# Patient Record
Sex: Male | Born: 1945
Health system: Southern US, Community
[De-identification: ages and names within clinical notes are randomized; demographics above are authoritative.]

## PROBLEM LIST (undated history)

## (undated) DIAGNOSIS — R579 Shock, unspecified: Secondary | ICD-10-CM

## (undated) DIAGNOSIS — K219 Gastro-esophageal reflux disease without esophagitis: Secondary | ICD-10-CM

## (undated) DIAGNOSIS — K922 Gastrointestinal hemorrhage, unspecified: Secondary | ICD-10-CM

## (undated) DIAGNOSIS — F32A Depression, unspecified: Secondary | ICD-10-CM

## (undated) DIAGNOSIS — N390 Urinary tract infection, site not specified: Secondary | ICD-10-CM

## (undated) DIAGNOSIS — E871 Hypo-osmolality and hyponatremia: Secondary | ICD-10-CM

## (undated) DIAGNOSIS — F329 Major depressive disorder, single episode, unspecified: Secondary | ICD-10-CM

## (undated) DIAGNOSIS — I10 Essential (primary) hypertension: Secondary | ICD-10-CM

## (undated) DIAGNOSIS — N400 Enlarged prostate without lower urinary tract symptoms: Secondary | ICD-10-CM

## (undated) DIAGNOSIS — R1310 Dysphagia, unspecified: Secondary | ICD-10-CM

## (undated) DIAGNOSIS — M1990 Unspecified osteoarthritis, unspecified site: Secondary | ICD-10-CM

## (undated) DIAGNOSIS — E119 Type 2 diabetes mellitus without complications: Secondary | ICD-10-CM

## (undated) DIAGNOSIS — N182 Chronic kidney disease, stage 2 (mild): Secondary | ICD-10-CM

## (undated) DIAGNOSIS — R011 Cardiac murmur, unspecified: Secondary | ICD-10-CM

## (undated) DIAGNOSIS — I4729 Other ventricular tachycardia: Secondary | ICD-10-CM

## (undated) DIAGNOSIS — Z8701 Personal history of pneumonia (recurrent): Secondary | ICD-10-CM

## (undated) DIAGNOSIS — I469 Cardiac arrest, cause unspecified: Secondary | ICD-10-CM

## (undated) DIAGNOSIS — I00 Rheumatic fever without heart involvement: Secondary | ICD-10-CM

## (undated) DIAGNOSIS — Z960 Presence of urogenital implants: Secondary | ICD-10-CM

## (undated) DIAGNOSIS — G4730 Sleep apnea, unspecified: Secondary | ICD-10-CM

## (undated) DIAGNOSIS — H9190 Unspecified hearing loss, unspecified ear: Secondary | ICD-10-CM

## (undated) DIAGNOSIS — I472 Ventricular tachycardia: Secondary | ICD-10-CM

## (undated) DIAGNOSIS — J189 Pneumonia, unspecified organism: Secondary | ICD-10-CM

## (undated) DIAGNOSIS — Z8673 Personal history of transient ischemic attack (TIA), and cerebral infarction without residual deficits: Secondary | ICD-10-CM

## (undated) HISTORY — PX: OTHER SURGICAL HISTORY: SHX169

---

## 1984-04-04 HISTORY — PX: LUMBAR LAMINECTOMY: SHX95

## 1987-04-05 HISTORY — PX: KNEE ARTHROSCOPY: SHX127

## 2002-05-20 ENCOUNTER — Encounter: Admission: RE | Admit: 2002-05-20 | Discharge: 2002-05-20 | Payer: Self-pay | Admitting: *Deleted

## 2004-02-10 ENCOUNTER — Ambulatory Visit: Payer: Self-pay | Admitting: Cardiology

## 2004-02-17 ENCOUNTER — Inpatient Hospital Stay (HOSPITAL_BASED_OUTPATIENT_CLINIC_OR_DEPARTMENT_OTHER): Admission: RE | Admit: 2004-02-17 | Discharge: 2004-02-17 | Payer: Self-pay | Admitting: Cardiology

## 2004-02-17 ENCOUNTER — Ambulatory Visit: Payer: Self-pay | Admitting: Cardiology

## 2004-02-18 ENCOUNTER — Ambulatory Visit: Payer: Self-pay | Admitting: Cardiology

## 2005-01-23 ENCOUNTER — Emergency Department (HOSPITAL_COMMUNITY): Admission: EM | Admit: 2005-01-23 | Discharge: 2005-01-23 | Payer: Self-pay | Admitting: Emergency Medicine

## 2007-12-27 ENCOUNTER — Ambulatory Visit: Payer: Self-pay | Admitting: Cardiology

## 2008-01-08 ENCOUNTER — Ambulatory Visit: Payer: Self-pay | Admitting: Cardiology

## 2008-01-11 ENCOUNTER — Ambulatory Visit: Payer: Self-pay | Admitting: Cardiology

## 2008-01-15 ENCOUNTER — Inpatient Hospital Stay (HOSPITAL_BASED_OUTPATIENT_CLINIC_OR_DEPARTMENT_OTHER): Admission: RE | Admit: 2008-01-15 | Discharge: 2008-01-15 | Payer: Self-pay | Admitting: Cardiovascular Disease

## 2008-01-15 ENCOUNTER — Ambulatory Visit: Payer: Self-pay | Admitting: Cardiovascular Disease

## 2008-04-04 HISTORY — PX: CARPAL TUNNEL RELEASE: SHX101

## 2008-04-04 HISTORY — PX: CERVICAL SPINE SURGERY: SHX589

## 2009-01-08 ENCOUNTER — Emergency Department (HOSPITAL_COMMUNITY): Admission: EM | Admit: 2009-01-08 | Discharge: 2009-01-09 | Payer: Self-pay | Admitting: Emergency Medicine

## 2010-08-17 NOTE — Assessment & Plan Note (Signed)
[facility]                          [facility]   Albert Garrison, Albert Garrison                      MRN:          [id number]  DATE:01/11/2008                            DOB:          27-Sep-1945    PRIMARY CARDIOLOGIST:  Learta Codding, MD, [facility]   REASON FOR VISIT:  Scheduled followup.  Please refer to my initial  consultation note of December 27, 2007, for full details.   Albert Garrison returns to our clinic for review of recent noninvasive  studies which were ordered for further evaluation of progressive  exertional dyspnea.  A baseline 2-D echo showed normal left ventricular  function with evidence of diastolic dysfunction, and no significant  valvular abnormalities.   I also ordered a stress echocardiogram, given that he had had a previous  false positive adenosine Cardiolite in 2005.  This preceded his only  cardiac catheterization, which yielded nonobstructive CAD with only  minimal stenosis (20-25%) in the LAD and RCA.   The current stress echocardiogram, however, was interpreted as an  equivocal study, by Dr. Simona Huh.  He stated that at peak stress  there was possible anteroseptal hypokinesis/paradoxical motion.  There  are no definite EKG changes, however, and no frank complaint of chest  pain.  During our visit today, however, there is suggestion by the  patient's wife that he was feeling some discomfort in the chest, holding  his fist up just below his throat.  He also clearly was experiencing  significant shortness of breath, as had been noted by his wife in the  recent past.   I also ordered a baseline chest x-ray which was negative.   A fasting lipid profile revealed total cholesterol of 204, triglycerides  of 258, HDL 24, and LDL 128.   CURRENT MEDICATIONS:  1. Sinemet CR 50/200 mg twice daily.  2. Amantadine 100 mg b.i.d.  3. Indomethacin 25 mg t.i.d.  4. Bupropion 100 mg b.i.d.  5. Namenda 10 mg b.i.d.  6.  Aspirin 81 mg daily.   PHYSICAL EXAMINATION:  VITAL SIGNS:  Blood pressure 157/93, pulse 56,  and regular weight 216.  GENERAL:  A 65 year old male, sitting upright, in no distress.  HEENT:  Normocephalic, atraumatic.  PERRLA.  EOMI.  NECK:  Palpable carotid pulse without bruits; no JVD.  LUNGS:  Clear to auscultation bilaterally.  HEART:  Regular rate and rhythm.  No significant murmurs.  No rubs.  ABDOMEN:  Soft, nontender with intact bowel sounds.  EXTREMITIES:  Palpable dorsalis pedis pulses with no edema.  SKIN:  No rashes or lesions.  MUSCULOSKELETAL:  No gross joint deformity.  NEUROLOGIC:  No focal deficit.   IMPRESSION:  1. Exertional dyspnea.      a.     Worrisome for anginal equivalent.      b.     Recent, equivocal dobutamine stress echocardiogram with       possible anteroseptal hypokinesis.      c.     History of nonobstructive coronary artery disease by cardiac       catheterization, November 2005.  d.     False positive adenosine stress Cardiolite (pre cath),       suggestive of inferobasal ischemia.  2. Preserved left ventricular function.      a.     Mild aortic regurgitation.  3. Mixed dyslipidemia.      a.     Hypertriglyceridemia/low HDL.  4. Type 2 diabetes mellitus, diet controlled.  5. Remote tobacco.  6. Borderline hypertension.  7. Parkinson disease.      a.     Associated dementia.  8. Bipolar disorder.  9. Obstructive sleep apnea, on continuous positive airway pressure.   PLAN:  Following review of recent stress echocardiogram with Dr. Andee Lineman,  plan is to proceed with a relook cardiac catheterization.  I am  concerned that Albert Garrison recent development of significant exertional  dyspnea is his anginal equivalent.  The patient is agreeable with this  recommendation and the risks/benefits were discussed.  We will arrange  to have this done in our JV catheterization lab early next week.  Of  note, I have also instructed that he start on Crestor  10 mg daily, for  aggressive treatment of mixed dyslipidemia.      Gene Serpe, PA-C  Electronically Signed      Learta Codding, MD,FACC  Electronically Signed   GS/MedQ  DD: 01/11/2008  DT: 01/12/2008  Job #: [id number]

## 2010-08-17 NOTE — Assessment & Plan Note (Signed)
[facility]                          [facility] NOTE   Albert Garrison, Albert Garrison                      MRN:          [id number]  DATE:12/27/2007                            DOB:          [birth year]    PRIMARY CARDIOLOGIST:  Learta Codding, MD,FACC (new)   PRIMARY CARE PHYSICIAN:  Dr. Selinda Flavin.   REASON FOR CONSULTATION:  Albert Garrison is a very pleasant 65 year old  male, with history of nonobstructive coronary artery disease by prior  catheterization in 2005, who is now referred for further evaluation of  exertional dyspnea.   Albert Garrison has not returned to our clinic since he underwent coronary  angiography.  This was following an abnormal stress test which was  performed for evaluation of chest pain.  The stress test suggested  inferobasal wall ischemia; EF 50%.   Cardiac catheterization, however, suggested nonobstructive CAD with  normal left ventricular function and no significant valvular  abnormalities.  Aggressive risk factor modification was recommended.   Albert Garrison has cardiac risk factors notable for diabetes mellitus (diet  controlled), history of dyslipidemia, and age.  He has no known history  of hypertension or family history of premature CAD.   According to the patient's wife, Albert Garrison has been complaining of  exertional dyspnea in the recent past.  This appears to be worse than it  had been in years past.  Of note, the patient denies any associated  chest discomfort.  He also denies any symptoms of PND, orthopnea, or  lower extremity edema.  He does have obstructive sleep apnea and is  compliant with CPAP.   EKG in our office today reveals NSR at 69 bpm with borderline LAD; no  ischemic changes.   ALLERGIES:  No known drug allergies.   CURRENT MEDICATIONS:  Namenda, Sinemet, amantadine 100 b.i.d.,  indomethacin t.i.d., and bupropion b.i.d.   PAST MEDICAL HISTORY:  1. Nonobstructive CAD.      a.     By cardiac  catheterization, November 2005.      b.     False positive adenosine stress Cardiolite.      c.     Normal left ventricular function.  2. Parkinson disease.      a.     Associated dementia.  3. Bipolar disorder.  4. Obstructive sleep apnea, on CPAP.  5. Type 2 diabetes mellitus, diet controlled.   FAMILY HISTORY:  Father deceased at age 74, secondary to myocardial  function.  Mother deceased age at 16.   SOCIAL HISTORY:  The patient is married.  They have 4 grown children.  He has not smoked tobacco since 1977.  He is a retired Games developer,  but still remains as active as he can.  Denies alcohol use.   REVIEW OF SYSTEMS:  As noted per HPI, remaining systems negative.   PHYSICAL EXAMINATION:  VITAL SIGNS:  Blood pressure 142/98, pulse 72,  regular, weight 213.  GENERAL:  A 65 year old male, sitting upright, in no distress.  HEENT:  Normocephalic, atraumatic.  PERRLA.  EOMI.  NECK:  Palpable carotid pulse without bruits; no  JVD at 90 degrees.  LUNGS:  Clear to auscultation bilaterally.  HEART:  Regular rate and rhythm (S1 and S2).  No significant murmurs.  No rubs.  ABDOMEN:  Soft, nontender, intact bowel sounds.  EXTREMITIES:  Palpable dorsalis pedis pulses with no pedal edema.  SKIN:  Mildly diaphoretic.  MUSCULOSKELETAL:  No joint deformity.  NEURO:  Alert and oriented.   IMPRESSION:  1. Exertional dyspnea.      a.     History of nonobstructive CAD, November 2005.      b.     History of normal LVF.      c.     False positive adenosine Cardiolite (pre cath).  2. Multiple cardiac risk factors.      a.     Diabetes mellitus, diet controlled.      b.     History of dyslipidemia.      c.     Remote tobacco.      d.     Age.      e.     Borderline hypertension.   PLAN:  1. Baseline 2-D echocardiogram for reassessment of LVF.  2. Exercise stress echocardiogram for risk stratification.  We will      keep a low threshold for consideration of a relook cardiac       catheterization, in the event there is any suggestion of ischemia.  3. Start aspirin 81 mg daily for primary prevention.  4. Assess lipid status with a fasting lipid profile.  5. Baseline two-view chest x-ray.  6. Schedule early clinic follow up with myself and Dr. Andee Lineman in 1      month, for review of study results and further recommendations.      Gene Serpe, PA-C  Electronically Signed      Learta Codding, MD,FACC  Electronically Signed   GS/MedQ  DD: 12/27/2007  DT: 12/28/2007  Job #: [id number]   cc:   Fara Chute

## 2010-08-17 NOTE — Cardiovascular Report (Signed)
NAME:  HENRRY, FEIL               ACCOUNT NO.:  [id number]   MEDICAL RECORD NO.:  [id number]          PATIENT TYPE:  OIB   LOCATION:  1961                         FACILITY:  [facility]   PHYSICIAN:  Verne Carrow, MDDATE OF BIRTH:  1945-11-05   DATE OF PROCEDURE:  DATE OF DISCHARGE:  01/15/2008                            CARDIAC CATHETERIZATION   INDICATIONS:  A 65 year old Caucasian male with known history of  nonobstructive coronary artery disease as well as hyperlipidemia,  hypertension, diabetes mellitus, and remote tobacco abuse who presents  with complaints of increased dyspnea on exertion.  Dobutamine stress  echocardiogram showed possible ischemia in the anteroseptal  distribution.   OPERATOR:  Verne Carrow, MD   PROCEDURES PERFORMED:  1. Left heart catheterization.  2. Selective coronary angiography.  3. Left ventricular angiogram.   DETAILS OF PROCEDURE:  The patient was brought to the Outpatient Heart  Catheterization Laboratory after signing informed consent for the  procedure.  The right groin was prepped and draped in a sterile fashion.  A 4-French sheath was inserted into the right femoral artery.  Lidocaine  1% was used for local anesthesia.  A 4-French JL-4 diagnostic catheter  was used to selectively inject the left coronary system.  A 3DRC  diagnostic catheter was used to selectively inject the right coronary  artery.  A 4-French pigtail catheter was used to cross the aortic valve  into the left ventricle.  A left ventricular angiogram was performed.  The pigtail catheter was then pullback across the aortic valve with no  significant pressure gradient measured.   FINDINGS:  1. The left main coronary artery bifurcates into the circumflex and      the LAD and is free of any significant flow limiting coronary      atherosclerosis.  2. Left anterior descending artery is a large vessel that courses to      the apex.  There is a 30% stenosis in the  proximal portion and a      20% stenosis in the midportion at the takeoff of a large diagonal      branch.  There is a 10-20% lesion in the proximal portion of the      diagonal branch.  3. The circumflex artery gives off several large obtuse marginal      branches and is free of any significant disease.  4. The right coronary artery is a large dominant vessel that gives off      a posterior descending branch as well as a posterolateral branch.      There is a 30% stenosis in the proximal portion of this vessel with      luminal irregularities in the mid and distal portion.  5. Left ventricular angiogram demonstrates normal systolic function      with no wall motion abnormalities.  Ejection fraction is estimated      at 60-65%.  6. Hemodynamic data:  Central aortic pressure 138/81, left ventricular      pressure 123/8, end-diastolic pressure 13.   IMPRESSION:  1. Nonobstructive coronary artery disease.  No significant change  since left heart catheterization in 2005.  2. Normal left ventricular systolic function.   RECOMMENDATIONS:  I recommend continued medical management of this  patient's nonobstructive coronary artery disease and further workup of  his dyspnea on exertion per Dr. Andee Lineman.  The patient will follow with  Dr. Andee Lineman in the [facility] in 2-3 weeks.      Verne Carrow, MD  Electronically Signed     CM/MEDQ  D:  01/15/2008  T:  01/15/2008  Job:  [id number]   cc:   Learta Codding, MD,FACC

## 2010-08-20 NOTE — Cardiovascular Report (Signed)
NAME:  Albert Garrison, FALLIN NO.:  [id number]   MEDICAL RECORD NO.:  [id number]          PATIENT TYPE:  OIB   LOCATION:  6501                         FACILITY:  [facility]   PHYSICIAN:  Jonelle Sidle, M.D. LHCDATE OF BIRTH:  [birth year]   DATE OF PROCEDURE:  02/17/2004  DATE OF DISCHARGE:                              CARDIAC CATHETERIZATION   PRIMARY CARE PHYSICIAN:  Selinda Flavin, M.D.   CARDIOLOGIST:  Learta Codding, M.D.   INDICATIONS:  Mr. Goodenow is a 65 year old male with a history of type 2  diabetes mellitus, obstructive sleep apnea, dyslipidemia and recent abnormal  Cardiolite showing a partially reversible inferior basal defect with an  overall ejection fraction of 50%.  He is referred for coronary angiography  to clearly outline the coronary anatomy.   PROCEDURES PERFORMED:  1.  Left heart catheterization.  2.  Selective coronary angiography.  3.  Left ventriculography.   ACCESS AND EQUIPMENT:  The area about the right femoral artery was  anesthetized with 1% lidocaine and a 4 French sheath was placed in the right  femoral artery via the modified Seldinger technique.  Standard preformed 4  [place] and JR4 catheters were used for selective coronary angiography  and an angled pigtail catheter was used for left heart catheterization and  left ventriculography.  All exchanges were made over a wire.  The patient  tolerated the procedure well without immediate complications.   HEMODYNAMICS:  Left ventricle 111/3 mmHg.  Aortic 111/54 mmHg.   ANGIOGRAPHIC FINDINGS:  1.  The left main coronary artery is free of significant flow-limited      coronary atherosclerosis.  2.  The left anterior descending has a large proximal bifurcating diagonal      branch with a small or more distal diagonal branch.  There is      approximately 25% stenosis in the proximal left anterior descending with      approximately 20% stenosis involving the proximal diagonal branch and    the left anterior descending at the takeoff of the proximal diagonal      branch.  No flow-limiting stenoses are noted.  3.  The circumflex coronary artery is a medium caliber vessel that has a      large bifurcating proximal obtuse marginal branch with two additional      obtuse marginal, the third of which is large and bifurcating as well.      No significant flow-limiting atherosclerosis is noted within this      system.  4.  The right coronary artery is a large dominant vessel.  There is      approximately 20% stenosis in the proximal segment with no other      significant stenoses noted.   Left ventriculography was performed in the RAO projection and revealed an  ejection fraction of 60-65% with no significant wall motion abnormalities  and no significant mitral regurgitation.   DIAGNOSES:  1.  Minor coronary atherosclerosis as outlined without flow-limiting      stenoses.  2.  Left ventricular ejection fraction in the range of 60-65% with no  significant mitral regurgitation.   RECOMMENDATIONS:  At this point would recommend risk factor modification  strategies.  There are no flow-limiting stenoses to require  revascularization and it is likely that the Cardiolite abnormalities were  artifactual.       SGM/MEDQ  D:  02/17/2004  T:  02/17/2004  Job:  [id number]

## 2013-12-30 ENCOUNTER — Emergency Department (HOSPITAL_COMMUNITY)
Admission: EM | Admit: 2013-12-30 | Discharge: 2013-12-30 | Disposition: A | Discharge status: Home / Self Care | Payer: Medicare Other | Attending: Emergency Medicine | Admitting: Emergency Medicine

## 2013-12-30 ENCOUNTER — Encounter (HOSPITAL_COMMUNITY): Payer: Self-pay | Admitting: Emergency Medicine

## 2013-12-30 DIAGNOSIS — N3091 Cystitis, unspecified with hematuria: Secondary | ICD-10-CM

## 2013-12-30 DIAGNOSIS — N3080 Other cystitis without hematuria: Secondary | ICD-10-CM | POA: Diagnosis not present

## 2013-12-30 DIAGNOSIS — R319 Hematuria, unspecified: Secondary | ICD-10-CM | POA: Diagnosis present

## 2013-12-30 LAB — URINALYSIS, ROUTINE W REFLEX MICROSCOPIC
Glucose, UA: 500 mg/dL — AB
Nitrite: POSITIVE — AB
PH: 5 (ref 5.0–8.0)
Protein, ur: >300 mg/dL — AB
Specific Gravity, Urine: 1.01 (ref 1.005–1.030)
UROBILINOGEN UA: 0.2 mg/dL (ref 0.0–1.0)

## 2013-12-30 LAB — URINE MICROSCOPIC-ADD ON

## 2013-12-30 MED ORDER — CEPHALEXIN 500 MG PO CAPS: 500.0000 mg | ORAL_CAPSULE | Freq: Two times a day (BID) | ORAL | Status: DC

## 2013-12-30 MED ORDER — CEPHALEXIN 500 MG PO CAPS
1000.0000 mg | ORAL_CAPSULE | Freq: Once | ORAL | Status: AC
  Administered 2013-12-30: 1000 mg via ORAL
  Filled 2013-12-30: qty 2

## 2013-12-30 NOTE — Discharge Instructions (Signed)
Urinary Tract Infection Urinary tract infections (UTIs) can develop anywhere along your urinary tract. Your urinary tract is your body's drainage system for removing wastes and extra water. Your urinary tract includes two kidneys, two ureters, a bladder, and a urethra. Your kidneys are a pair of bean-shaped organs. Each kidney is about the size of your fist. They are located below your ribs, one on each side of your spine. CAUSES Infections are caused by microbes, which are microscopic organisms, including fungi, viruses, and bacteria. These organisms are so small that they can only be seen through a microscope. Bacteria are the microbes that most commonly cause UTIs. SYMPTOMS  Symptoms of UTIs may vary by age and gender of the patient and by the location of the infection. Symptoms in young women typically include a frequent and intense urge to urinate and a painful, burning feeling in the bladder or urethra during urination. Older women and men are more likely to be tired, shaky, and weak and have muscle aches and abdominal pain. A fever may mean the infection is in your kidneys. Other symptoms of a kidney infection include pain in your back or sides below the ribs, nausea, and vomiting. DIAGNOSIS To diagnose a UTI, your caregiver will ask you about your symptoms. Your caregiver also will ask to provide a urine sample. The urine sample will be tested for bacteria and white blood cells. White blood cells are made by your body to help fight infection. TREATMENT  Typically, UTIs can be treated with medication. Because most UTIs are caused by a bacterial infection, they usually can be treated with the use of antibiotics. The choice of antibiotic and length of treatment depend on your symptoms and the type of bacteria causing your infection. HOME CARE INSTRUCTIONS  If you were prescribed antibiotics, take them exactly as your caregiver instructs you. Finish the medication even if you feel better after you  have only taken some of the medication.  Drink enough water and fluids to keep your urine clear or pale yellow.  Avoid caffeine, tea, and carbonated beverages. They tend to irritate your bladder.  Empty your bladder often. Avoid holding urine for long periods of time.  Empty your bladder before and after sexual intercourse. SEEK MEDICAL CARE IF:   You have back pain.  You develop a fever.  Your symptoms do not begin to resolve within 3 days. SEEK IMMEDIATE MEDICAL CARE IF:   You have severe back pain or lower abdominal pain.  You develop chills.  You have nausea or vomiting.  You have continued burning or discomfort with urination. MAKE SURE YOU:   Understand these instructions.  Will watch your condition.  Will get help right away if you are not doing well or get worse. Document Released: 12/29/2004 Document Revised: 09/20/2011 Document Reviewed: 04/29/2011 [facility] Patient Information 2015 [place], [place]. This information is not intended to replace advice given to you by your health care provider. Make sure you discuss any questions you have with your health care provider.

## 2013-12-30 NOTE — ED Provider Notes (Signed)
CSN: [id number]     Arrival date & time 12/30/13  0605 History   None    Chief Complaint  Patient presents with  . Hematuria     (Consider location/radiation/quality/duration/timing/severity/associated sxs/prior Treatment) HPI This is a 68 year old male who will this morning about 2 AM to void his bladder. He noticed at that time to be sure it was pink. It is subsequently become grossly bloody. He is also experiencing burning with urination. This burning is not severe. He states it feels like a previous urinary tract infection although he has not had hematuria before. He is on no blood thinners including aspirin. He is not aware of having a fever but did have chills this morning. He denies nausea, vomiting or diarrhea. He denies abdominal pain.  No past medical history on file. No past surgical history on file. No family history on file. History  Substance Use Topics  . Smoking status: Not on file  . Smokeless tobacco: Not on file  . Alcohol Use: Not on file    Review of Systems  All other systems reviewed and are negative.   Allergies  Review of patient's allergies indicates not on file.  Home Medications   Prior to Admission medications   Not on File   There were no vitals taken for this visit.  Physical Exam General: Well-developed, well-nourished male in no acute distress; appearance consistent with age of record HENT: normocephalic; atraumatic Eyes: pupils equal, round and reactive to light; extraocular muscles intact Neck: supple Heart: regular rate and rhythm Lungs: clear to auscultation bilaterally Abdomen: soft; nondistended; nontender; no masses or hepatosplenomegaly; bowel sounds present GU: No CVA tenderness; urine grossly bloody Extremities: No deformity; full range of motion; pulses normal Neurologic: Awake, alert and oriented; motor function intact in all extremities and symmetric; no facial droop Skin: Warm and dry Psychiatric: Normal mood and  affect    ED Course  Procedures (including critical care time)  MDM   Nursing notes and vitals signs, including pulse oximetry, reviewed.  Summary of this visit's results, reviewed by myself:  Labs:  Results for orders placed during the hospital encounter of 12/30/13 (from the past 24 hour(s))  URINALYSIS, ROUTINE W REFLEX MICROSCOPIC     Status: Abnormal   Collection Time    12/30/13  6:16 AM      Result Value Ref Range   Color, Urine RED (*) YELLOW   APPearance CLOUDY (*) CLEAR   Specific Gravity, Urine 1.010  1.005 - 1.030   pH 5.0  5.0 - 8.0   Glucose, UA 500 (*) NEGATIVE mg/dL   Hgb urine dipstick LARGE (*) NEGATIVE   Bilirubin Urine SMALL (*) NEGATIVE   Ketones, ur TRACE (*) NEGATIVE mg/dL   Protein, ur >300 (*) NEGATIVE mg/dL   Urobilinogen, UA 0.2  0.0 - 1.0 mg/dL   Nitrite POSITIVE (*) NEGATIVE   Leukocytes, UA MODERATE (*) NEGATIVE  URINE MICROSCOPIC-ADD ON     Status: Abnormal   Collection Time    12/30/13  6:16 AM      Result Value Ref Range   WBC, UA TOO NUMEROUS TO COUNT  <3 WBC/hpf   RBC / HPF TOO NUMEROUS TO COUNT  <3 RBC/hpf   Bacteria, UA MANY (*) RARE      Karen Chafe Cortlan Dolin, MD 12/30/13 0630

## 2014-01-01 LAB — URINE CULTURE: Colony Count: >=100000

## 2014-01-02 ENCOUNTER — Telehealth (HOSPITAL_BASED_OUTPATIENT_CLINIC_OR_DEPARTMENT_OTHER): Payer: Self-pay | Admitting: Emergency Medicine

## 2014-01-02 NOTE — Progress Notes (Signed)
ED Antimicrobial Stewardship Positive Culture Follow Up   Albert Garrison is an 68 y.o. male who presented to [facility] on 12/30/2013 with a chief complaint of  Chief Complaint  Patient presents with  . Hematuria    Recent Results (from the past 720 hour(s))  URINE CULTURE     Status: None   Collection Time    12/30/13  6:33 AM      Result Value Ref Range Status   Specimen Description URINE, CLEAN CATCH   Final   Special Requests NONE   Final   Culture  Setup Time     Final   Value: 12/30/2013 13:17     Performed at [facility]     Final   Value: >=100,000 COLONIES/ML     Performed at [facility]   Culture     Final   Value: SERRATIA MARCESCENS     Performed at [facility]   Report Status 01/01/2014 FINAL   Final   Organism ID, Bacteria SERRATIA MARCESCENS   Final    [x]  Treated with cephalexin, organism resistant to prescribed antimicrobial   New antibiotic prescription: stop cephalexin.  Start Bactrim DS 1 tablet BID x 10 days  ED Provider: Michele Mcalpine, PA-C   [name] 01/02/2014, 9:33 AM Infectious Diseases Pharmacist Phone# [phone]

## 2014-01-02 NOTE — Telephone Encounter (Signed)
Post ED Visit - Positive Culture Follow-up: Successful Patient Follow-Up  Culture assessed and recommendations reviewed by: []  Wes Roy, Pharm.D., BCPS [x]  Heide Guile, Pharm.D., BCPS []  Alycia Rossetti, Pharm.D., BCPS []  [place], Pharm.D., BCPS, AAHIVP []  Legrand Como, Pharm.D., BCPS, AAHIVP []  Hassie Bruce, Pharm.D. []  Milus Glazier, [place].D.  Positive urine culture  []  Patient discharged without antimicrobial prescription and treatment is now indicated [x]  Organism is resistant to prescribed ED discharge antimicrobial []  Patient with positive blood cultures  Changes discussed with ED provider: Michele Mcalpine [facility] New antibiotic prescription Stop Keflex, change to Bactrim DS 1 tablet bid x 10 days Called to   01/02/14 attempt to reach pt unsuccessful   Hazle Nordmann 01/02/2014, 12:29 PM

## 2014-01-03 ENCOUNTER — Telehealth (HOSPITAL_COMMUNITY): Payer: Self-pay

## 2014-01-04 ENCOUNTER — Telehealth (HOSPITAL_COMMUNITY): Payer: Self-pay

## 2014-01-06 ENCOUNTER — Encounter (HOSPITAL_COMMUNITY): Payer: Self-pay | Admitting: Emergency Medicine

## 2014-01-06 ENCOUNTER — Emergency Department (HOSPITAL_COMMUNITY)
Admission: EM | Admit: 2014-01-06 | Discharge: 2014-01-06 | Disposition: A | Discharge status: Home / Self Care | Payer: Medicare Other | Attending: Emergency Medicine | Admitting: Emergency Medicine

## 2014-01-06 DIAGNOSIS — Z792 Long term (current) use of antibiotics: Secondary | ICD-10-CM | POA: Insufficient documentation

## 2014-01-06 DIAGNOSIS — R300 Dysuria: Secondary | ICD-10-CM | POA: Diagnosis present

## 2014-01-06 DIAGNOSIS — N3001 Acute cystitis with hematuria: Secondary | ICD-10-CM | POA: Diagnosis not present

## 2014-01-06 DIAGNOSIS — Z87891 Personal history of nicotine dependence: Secondary | ICD-10-CM | POA: Insufficient documentation

## 2014-01-06 LAB — URINALYSIS, ROUTINE W REFLEX MICROSCOPIC
BILIRUBIN URINE: NEGATIVE
Glucose, UA: >1000 mg/dL — AB
KETONES UR: 15 mg/dL — AB
NITRITE: NEGATIVE
PH: 5.5 (ref 5.0–8.0)
Protein, ur: 30 mg/dL — AB
Urobilinogen, UA: 0.2 mg/dL (ref 0.0–1.0)

## 2014-01-06 LAB — URINE MICROSCOPIC-ADD ON

## 2014-01-06 MED ORDER — SULFAMETHOXAZOLE-TMP DS 800-160 MG PO TABS
1.0000 | ORAL_TABLET | Freq: Once | ORAL | Status: AC
  Administered 2014-01-06: 1 via ORAL
  Filled 2014-01-06: qty 1

## 2014-01-06 MED ORDER — SULFAMETHOXAZOLE-TRIMETHOPRIM 800-160 MG PO TABS: 1.0000 | ORAL_TABLET | Freq: Two times a day (BID) | ORAL | Status: DC

## 2014-01-06 NOTE — ED Notes (Signed)
Paitent complaining of burning with urination starting over 1 week ago. States "I had blood in my urine last Sunday and some burning when I go to the bathroom. I'm not bleeding anymore but the pain is worse."

## 2014-01-06 NOTE — ED Provider Notes (Signed)
CSN: [id number]     Arrival date & time 01/06/14  0903 History   First MD Initiated Contact with Patient 01/06/14 [phone]     Chief Complaint  Patient presents with  . Dysuria     (Consider location/radiation/quality/duration/timing/severity/associated sxs/prior Treatment) HPI   Albert Garrison is a 68 y.o. male who presents to the Emergency Department complaining of difficulty urinating and burning sensation.  Patient states he was seen here last week for same, had blood in his urine at that time.  He states the symptoms seemed to improve somewhat and the blood resolved, but he noticed the burning sensation and hesitancy started back 4-5 days ago.  He states the pain is worse with urination and feels like he is unable to completely empty his bladder.  He also states that he had "prostate problems fifteen years ago" He denies vomiting, flank pain, abdominal pain, fever or chills.  He has not seen a urologist or his primary physician since his previous visit here.     History reviewed. No pertinent past medical history. History reviewed. No pertinent past surgical history. History reviewed. No pertinent family history. History  Substance Use Topics  . Smoking status: Former Research scientist (life sciences)  . Smokeless tobacco: Never Used  . Alcohol Use: 0.6 oz/week    1 Cans of beer per week     Comment: occ    Review of Systems  Constitutional: Negative for fever, chills, activity change and appetite change.  Respiratory: Negative for shortness of breath.   Cardiovascular: Negative for chest pain.  Gastrointestinal: Negative for nausea, vomiting and abdominal pain.  Genitourinary: Positive for dysuria, urgency and difficulty urinating. Negative for frequency, hematuria, flank pain, discharge and penile swelling.  Musculoskeletal: Negative for back pain.  Skin: Negative for rash.  Neurological: Negative for dizziness and light-headedness.  Hematological: Negative for adenopathy.  All other systems reviewed  and are negative.     Allergies  Review of patient's allergies indicates no known allergies.  Home Medications   Prior to Admission medications   Medication Sig Start Date End Date Taking? Authorizing Provider  cephALEXin (KEFLEX) 500 MG capsule Take 1 capsule (500 mg total) by mouth 2 (two) times daily. 12/30/13   John L Molpus, MD   BP 133/83  Pulse 77  Temp(Src) 99.2 F (37.3 C) (Oral)  Resp 14  SpO2 97% Physical Exam  Nursing note and vitals reviewed. Constitutional: He is oriented to person, place, and time. He appears well-developed and well-nourished. No distress.  HENT:  Head: Normocephalic and atraumatic.  Mouth/Throat: Oropharynx is clear and moist.  Cardiovascular: Normal rate, regular rhythm, normal heart sounds and intact distal pulses.   No murmur heard. Pulmonary/Chest: Effort normal and breath sounds normal. No respiratory distress.  Abdominal: Soft. Normal appearance. He exhibits no distension. There is no tenderness. There is no rebound, no guarding and no CVA tenderness.  Musculoskeletal: Normal range of motion.  Neurological: He is alert and oriented to person, place, and time. He exhibits normal muscle tone. Coordination normal.  Skin: Skin is warm and dry. No rash noted.    ED Course  Procedures (including critical care time) Labs Review Labs Reviewed  URINALYSIS, ROUTINE W REFLEX MICROSCOPIC - Abnormal; Notable for the following:    APPearance HAZY (*)    Specific Gravity, Urine <1.005 (*)    Glucose, UA >1000 (*)    Hgb urine dipstick MODERATE (*)    Ketones, ur 15 (*)    Protein, ur 30 (*)  Leukocytes, UA MODERATE (*)    All other components within normal limits  URINE MICROSCOPIC-ADD ON - Abnormal; Notable for the following:    Bacteria, UA FEW (*)    All other components within normal limits  URINE CULTURE    Imaging Review No results found.   EKG Interpretation None      Urine culture pending MDM   Final diagnoses:  Acute  cystitis with hematuria    Previous ED chart reviewed.   Pt had urine culture from previous visit that grew Serratia Marcescens, was resistant to Cefazolin.  Per patient's medical records, several attempts made to contact patient, but he states he never received the message.  Will repeat the culture today and prescribe Bactrim DS.  Care plan discussed with Dr. Lacinda Axon.    Foley catheter place and 1800 cc of dark, cloudy urine in the bag.  Patient is feeling better.  He is non-toxic appearing and stable for d/c.  No concerning sx's for pyelonephritis.  Catheter removed and he agrees to arrange f/u with urology or return here if needed    Albert Payton L. Vanessa [place], PA-C 01/07/14 2002

## 2014-01-06 NOTE — Discharge Instructions (Signed)

## 2014-01-08 LAB — URINE CULTURE: Colony Count: >=100000

## 2014-01-08 NOTE — ED Provider Notes (Signed)
Medical screening examination/treatment/procedure(s) were conducted as a shared visit with non-physician practitioner(s) and myself.  I personally evaluated the patient during the encounter.   EKG Interpretation None     Complains of persistent dysuria. Culture results reviewed from previous visit. Will change antibiotic to Bactrim DS. Patient is nontoxic. No fever or chills. Patient understands need for urological followup.  Nat Christen, MD 01/08/14 [phone]

## 2014-01-09 ENCOUNTER — Telehealth (HOSPITAL_BASED_OUTPATIENT_CLINIC_OR_DEPARTMENT_OTHER): Payer: Self-pay | Admitting: Emergency Medicine

## 2014-01-09 NOTE — Telephone Encounter (Signed)
Post ED Visit - Positive Culture Follow-up  Culture report reviewed by antimicrobial stewardship pharmacist: []  Wes Winthrop, Pharm.D., BCPS []  Heide Guile, Pharm.D., BCPS []  Alycia Rossetti, Pharm.D., BCPS [x]  [place], Pharm.D., BCPS, AAHIVP []  Legrand Como, Pharm.D., BCPS, AAHIVP []  Carly Sabat, Pharm.D. []  Elenor Quinones, Pharm.D.  Positive urine culture serratia Treated with sulfamethoxazole-trimethoprim 800-160mg  bid x 7 days, organism sensitive to the same and no further patient follow-up is required at this time.  Hazle Nordmann 01/09/2014, 11:38 AM

## 2014-01-22 ENCOUNTER — Encounter (HOSPITAL_COMMUNITY): Payer: Self-pay | Admitting: Emergency Medicine

## 2014-01-22 ENCOUNTER — Emergency Department (HOSPITAL_COMMUNITY)
Admission: EM | Admit: 2014-01-22 | Discharge: 2014-01-22 | Disposition: A | Discharge status: Home / Self Care | Payer: Medicare Other | Attending: Emergency Medicine | Admitting: Emergency Medicine

## 2014-01-22 DIAGNOSIS — Z87891 Personal history of nicotine dependence: Secondary | ICD-10-CM | POA: Insufficient documentation

## 2014-01-22 DIAGNOSIS — Z792 Long term (current) use of antibiotics: Secondary | ICD-10-CM | POA: Insufficient documentation

## 2014-01-22 DIAGNOSIS — R309 Painful micturition, unspecified: Secondary | ICD-10-CM | POA: Diagnosis present

## 2014-01-22 DIAGNOSIS — N390 Urinary tract infection, site not specified: Secondary | ICD-10-CM | POA: Insufficient documentation

## 2014-01-22 LAB — URINALYSIS, ROUTINE W REFLEX MICROSCOPIC
BILIRUBIN URINE: NEGATIVE
Glucose, UA: 100 mg/dL — AB
KETONES UR: NEGATIVE mg/dL
NITRITE: NEGATIVE
PROTEIN: NEGATIVE mg/dL
Specific Gravity, Urine: <1.005 — ABNORMAL LOW (ref 1.005–1.030)
UROBILINOGEN UA: 0.2 mg/dL (ref 0.0–1.0)
pH: 6 (ref 5.0–8.0)

## 2014-01-22 LAB — CBC WITH DIFFERENTIAL/PLATELET
BASOS ABS: 0 10*3/uL (ref 0.0–0.1)
BASOS PCT: 0 % (ref 0–1)
EOS ABS: 0 10*3/uL (ref 0.0–0.7)
Eosinophils Relative: 0 % (ref 0–5)
HCT: 33.8 % — ABNORMAL LOW (ref 39.0–52.0)
Hemoglobin: 11.8 g/dL — ABNORMAL LOW (ref 13.0–17.0)
Lymphocytes Relative: 12 % (ref 12–46)
Lymphs Abs: 1.9 10*3/uL (ref 0.7–4.0)
MCH: 30.5 pg (ref 26.0–34.0)
MCHC: 34.9 g/dL (ref 30.0–36.0)
MCV: 87.3 fL (ref 78.0–100.0)
Monocytes Absolute: 1.3 10*3/uL — ABNORMAL HIGH (ref 0.1–1.0)
Monocytes Relative: 8 % (ref 3–12)
NEUTROS ABS: 13.1 10*3/uL — AB (ref 1.7–7.7)
NEUTROS PCT: 80 % — AB (ref 43–77)
PLATELETS: 470 10*3/uL — AB (ref 150–400)
RBC: 3.87 MIL/uL — ABNORMAL LOW (ref 4.22–5.81)
RDW: 13.6 % (ref 11.5–15.5)
WBC: 16.3 10*3/uL — ABNORMAL HIGH (ref 4.0–10.5)

## 2014-01-22 LAB — BASIC METABOLIC PANEL
ANION GAP: 16 — AB (ref 5–15)
BUN: 14 mg/dL (ref 6–23)
CHLORIDE: 90 meq/L — AB (ref 96–112)
CO2: 23 mEq/L (ref 19–32)
Calcium: 9.3 mg/dL (ref 8.4–10.5)
Creatinine, Ser: 1.57 mg/dL — ABNORMAL HIGH (ref 0.50–1.35)
GFR calc non Af Amer: 43 mL/min — ABNORMAL LOW (ref >90)
GFR, EST AFRICAN AMERICAN: 50 mL/min — AB (ref >90)
Glucose, Bld: 196 mg/dL — ABNORMAL HIGH (ref 70–99)
POTASSIUM: 3.8 meq/L (ref 3.7–5.3)
SODIUM: 129 meq/L — AB (ref 137–147)

## 2014-01-22 LAB — URINE MICROSCOPIC-ADD ON

## 2014-01-22 MED ORDER — CIPROFLOXACIN IN D5W 400 MG/200ML IV SOLN
400.0000 mg | Freq: Once | INTRAVENOUS | Status: AC
  Administered 2014-01-22: 400 mg via INTRAVENOUS
  Filled 2014-01-22: qty 200

## 2014-01-22 MED ORDER — CIPROFLOXACIN HCL 500 MG PO TABS: 500.0000 mg | ORAL_TABLET | Freq: Two times a day (BID) | ORAL | Status: DC

## 2014-01-22 MED ORDER — HYDROMORPHONE HCL 1 MG/ML IJ SOLN: 1.0000 mg | Freq: Once | INTRAMUSCULAR | Status: DC

## 2014-01-22 NOTE — ED Notes (Signed)
Pt states he has been having lower abdominal pain that began a month ago. He states, "I think it is a bladder infection." Pt states his urine has been smelling different and it has been cloudy. He states blood was in his urine when the pain began.

## 2014-01-22 NOTE — Discharge Instructions (Signed)

## 2014-01-23 NOTE — ED Provider Notes (Signed)
CSN: [id number]     Arrival date & time 01/22/14  1022 History   First MD Initiated Contact with Patient 01/22/14 1045     Chief Complaint  Patient presents with  . Burning with urination      (Consider location/radiation/quality/duration/timing/severity/associated sxs/prior Treatment) HPI  Albert Garrison is a 68 y.o. male who presents to the Emergency Department complaining of burning with urination and malodorous urine.  He was seen here in September and again earlier this month for same and treated with bactrim for a UTI on the last visit.  He states the symptoms improved, but returned several days ago.  He reports having blood in his urine last month, but denies any recently.  He also denies fever, back or flank pain, urinary rentention, chills or vomiting.  Patient states he did not arrange follow-up with urology or PMD as advised on his last visit stating that he felt better and did not feel it was necessary.       History reviewed. No pertinent past medical history. Past Surgical History  Procedure Laterality Date  . Cervical spine surgery  2010   History reviewed. No pertinent family history. History  Substance Use Topics  . Smoking status: Former Research scientist (life sciences)  . Smokeless tobacco: Never Used  . Alcohol Use: 0.6 oz/week    1 Cans of beer per week     Comment: occ    Review of Systems  Constitutional: Negative for fever, chills, activity change and appetite change.  Respiratory: Negative for shortness of breath.   Cardiovascular: Negative for chest pain.  Gastrointestinal: Negative for nausea, vomiting and abdominal distention.       Lower abdominal "pressure"  Genitourinary: Positive for dysuria, urgency and frequency. Negative for hematuria, flank pain, decreased urine volume, discharge, penile swelling, scrotal swelling, difficulty urinating, penile pain and testicular pain.  Musculoskeletal: Negative for back pain.  Skin: Negative for rash.  Neurological: Negative for  weakness, numbness and headaches.  Hematological: Negative for adenopathy.  All other systems reviewed and are negative.     Allergies  Review of patient's allergies indicates no known allergies.  Home Medications   Prior to Admission medications   Medication Sig Start Date End Date Taking? Authorizing Provider  ibuprofen (ADVIL,MOTRIN) 200 MG tablet Take 200 mg by mouth every 6 (six) hours as needed for moderate pain.   Yes Historical Provider, MD  ciprofloxacin (CIPRO) 500 MG tablet Take 1 tablet (500 mg total) by mouth 2 (two) times daily. For 10 days 01/22/14   Brigit Doke L. Mossie Gilder, PA-C  sulfamethoxazole-trimethoprim (SEPTRA DS) 800-160 MG per tablet Take 1 tablet by mouth 2 (two) times daily. For 7 days 01/06/14   Kieron Kantner L. Maryclare Nydam, PA-C   BP 150/89  Pulse 107  Temp(Src) 98.3 F (36.8 C) (Oral)  Resp 18  Ht 6' (1.829 m)  Wt 200 lb (90.719 kg)  BMI 27.12 kg/m2  SpO2 100% Physical Exam  Nursing note and vitals reviewed. Constitutional: He is oriented to person, place, and time. He appears well-developed and well-nourished. No distress.  HENT:  Head: Normocephalic and atraumatic.  Mouth/Throat: Oropharynx is clear and moist.  Cardiovascular: Normal rate, regular rhythm, normal heart sounds and intact distal pulses.   No murmur heard. Pulmonary/Chest: Effort normal and breath sounds normal. No respiratory distress.  Abdominal: Soft. Normal appearance. He exhibits no distension. There is tenderness in the suprapubic area. There is no CVA tenderness.  Mild suprapubic tenderness.  No guarding or rebound.  No CVA tenderness  Musculoskeletal: Normal range of motion.  Neurological: He is alert and oriented to person, place, and time. He exhibits normal muscle tone. Coordination normal.  Skin: No rash noted.    ED Course  Procedures (including critical care time) Labs Review Labs Reviewed  URINALYSIS, ROUTINE W REFLEX MICROSCOPIC - Abnormal; Notable for the following:     Specific Gravity, Urine <1.005 (*)    Glucose, UA 100 (*)    Hgb urine dipstick MODERATE (*)    Leukocytes, UA LARGE (*)    All other components within normal limits  CBC WITH DIFFERENTIAL - Abnormal; Notable for the following:    WBC 16.3 (*)    RBC 3.87 (*)    Hemoglobin 11.8 (*)    HCT 33.8 (*)    Platelets 470 (*)    Neutrophils Relative % 80 (*)    Neutro Abs 13.1 (*)    Monocytes Absolute 1.3 (*)    All other components within normal limits  BASIC METABOLIC PANEL - Abnormal; Notable for the following:    Sodium 129 (*)    Chloride 90 (*)    Glucose, Bld 196 (*)    Creatinine, Ser 1.57 (*)    GFR calc non Af Amer 43 (*)    GFR calc Af Amer 50 (*)    Anion gap 16 (*)    All other components within normal limits  URINE MICROSCOPIC-ADD ON - Abnormal; Notable for the following:    Bacteria, UA MANY (*)    All other components within normal limits  URINE CULTURE    Imaging Review No results found.   EKG Interpretation None      MDM   Final diagnoses:  Urinary tract infection, acute    Urine culture has been repeated again. Culture grew serratia marcescens.  Previous culture x 2 both indicated sensitivity to bactrim and patient reports taking the abx as directed.  I have discussed hx and labs with Dr. Roderic Palau.  Plan today includes IV cipro and rx for cipro x 10 days. Pt is well appearing and non-toxic.  Stable for discharge.  I have advised pt of importance of close f/u with his PMD or urology recheck in 1-2 days.  Advised pt if not rechecked and sx's not improving, to return here and patient agrees to plan.      Durene Dodge L. Vanessa [place], PA-C 01/23/14 2106

## 2014-01-25 LAB — URINE CULTURE: Colony Count: >=100000

## 2014-01-26 ENCOUNTER — Telehealth (HOSPITAL_BASED_OUTPATIENT_CLINIC_OR_DEPARTMENT_OTHER): Payer: Self-pay | Admitting: Emergency Medicine

## 2014-01-26 NOTE — Telephone Encounter (Signed)
Post ED Visit - Positive Culture Follow-up  Culture report reviewed by antimicrobial stewardship pharmacist: []  Wes [place], Pharm.D., BCPS []  Heide Guile, Pharm.D., BCPS []  Alycia Rossetti, Pharm.D., BCPS [x]  [place], Pharm.D., BCPS, AAHIVP []  Legrand [place], Pharm.D., BCPS, AAHIVP []  Carly Sabat, Pharm.D. []  Elenor Quinones, Pharm.D.  Positive urine culture Serratia Marcescens Treated with Ciprofloxacin 500mg  po bid x 10 days, organism sensitive to the same and no further patient follow-up is required at this time.  Hazle Nordmann 01/26/2014, 11:07 AM

## 2014-01-28 NOTE — ED Provider Notes (Signed)
Medical screening examination/treatment/procedure(s) were performed by non-physician practitioner and as supervising physician I was immediately available for consultation/collaboration.   EKG Interpretation None     '   Macy Lingenfelter L Lakitha Gordy, MD 01/28/14 0751 

## 2014-02-03 ENCOUNTER — Telehealth (HOSPITAL_COMMUNITY): Payer: Self-pay

## 2014-02-03 NOTE — ED Notes (Signed)
Unable to contact pt by mail or telephone. Unable to communicate lab results or treatment changes. 

## 2014-02-06 ENCOUNTER — Emergency Department (HOSPITAL_COMMUNITY)
Admission: EM | Admit: 2014-02-06 | Discharge: 2014-02-06 | Disposition: A | Discharge status: Home / Self Care | Payer: Medicare Other | Attending: Emergency Medicine | Admitting: Emergency Medicine

## 2014-02-06 ENCOUNTER — Encounter (HOSPITAL_COMMUNITY): Payer: Self-pay

## 2014-02-06 DIAGNOSIS — Z792 Long term (current) use of antibiotics: Secondary | ICD-10-CM | POA: Insufficient documentation

## 2014-02-06 DIAGNOSIS — R350 Frequency of micturition: Secondary | ICD-10-CM | POA: Diagnosis present

## 2014-02-06 DIAGNOSIS — N390 Urinary tract infection, site not specified: Secondary | ICD-10-CM | POA: Insufficient documentation

## 2014-02-06 DIAGNOSIS — Z87891 Personal history of nicotine dependence: Secondary | ICD-10-CM | POA: Diagnosis not present

## 2014-02-06 DIAGNOSIS — I10 Essential (primary) hypertension: Secondary | ICD-10-CM | POA: Diagnosis not present

## 2014-02-06 LAB — URINALYSIS, ROUTINE W REFLEX MICROSCOPIC
Bilirubin Urine: NEGATIVE
Glucose, UA: 100 mg/dL — AB
KETONES UR: NEGATIVE mg/dL
NITRITE: NEGATIVE
Protein, ur: 100 mg/dL — AB
SPECIFIC GRAVITY, URINE: 1.01 (ref 1.005–1.030)
Urobilinogen, UA: 0.2 mg/dL (ref 0.0–1.0)
pH: 6 (ref 5.0–8.0)

## 2014-02-06 LAB — URINE MICROSCOPIC-ADD ON

## 2014-02-06 MED ORDER — SULFAMETHOXAZOLE-TRIMETHOPRIM 800-160 MG PO TABS: 1.0000 | ORAL_TABLET | Freq: Two times a day (BID) | ORAL | Status: DC

## 2014-02-06 NOTE — ED Notes (Signed)
Patient states "i have a bladder infection again" patient states he has pain and difficulty urinating. Patient A&OX4

## 2014-02-06 NOTE — ED Notes (Signed)
Patient verbalizes understanding of discharge instructions, prescription medications, and follow up care with urology. Patient ambulatory out of department at this time.

## 2014-02-06 NOTE — Discharge Instructions (Signed)
You have had 3 infections that have all had the same germ. When this happens, you need to be on an antibiotic for a much longer period of time. You're being given a prescription for enough antibiotic to last 1 month. However, I expect that she will need treatment for 2 or 3 months. Please follow-up with your doctor and/or with the urologist to get additional antibiotic and to evaluate whether you need treatment for an enlarged prostate.  Urinary Tract Infection Urinary tract infections (UTIs) can develop anywhere along your urinary tract. Your urinary tract is your body's drainage system for removing wastes and extra water. Your urinary tract includes two kidneys, two ureters, a bladder, and a urethra. Your kidneys are a pair of bean-shaped organs. Each kidney is about the size of your fist. They are located below your ribs, one on each side of your spine. CAUSES Infections are caused by microbes, which are microscopic organisms, including fungi, viruses, and bacteria. These organisms are so small that they can only be seen through a microscope. Bacteria are the microbes that most commonly cause UTIs. SYMPTOMS  Symptoms of UTIs may vary by age and gender of the patient and by the location of the infection. Symptoms in young women typically include a frequent and intense urge to urinate and a painful, burning feeling in the bladder or urethra during urination. Older women and men are more likely to be tired, shaky, and weak and have muscle aches and abdominal pain. A fever may mean the infection is in your kidneys. Other symptoms of a kidney infection include pain in your back or sides below the ribs, nausea, and vomiting. DIAGNOSIS To diagnose a UTI, your caregiver will ask you about your symptoms. Your caregiver also will ask to provide a urine sample. The urine sample will be tested for bacteria and white blood cells. White blood cells are made by your body to help fight infection. TREATMENT  Typically,  UTIs can be treated with medication. Because most UTIs are caused by a bacterial infection, they usually can be treated with the use of antibiotics. The choice of antibiotic and length of treatment depend on your symptoms and the type of bacteria causing your infection. HOME CARE INSTRUCTIONS  If you were prescribed antibiotics, take them exactly as your caregiver instructs you. Finish the medication even if you feel better after you have only taken some of the medication.  Drink enough water and fluids to keep your urine clear or pale yellow.  Avoid caffeine, tea, and carbonated beverages. They tend to irritate your bladder.  Empty your bladder often. Avoid holding urine for long periods of time.  Empty your bladder before and after sexual intercourse.  After a bowel movement, women should cleanse from front to back. Use each tissue only once. SEEK MEDICAL CARE IF:   You have back pain.  You develop a fever.  Your symptoms do not begin to resolve within 3 days. SEEK IMMEDIATE MEDICAL CARE IF:   You have severe back pain or lower abdominal pain.  You develop chills.  You have nausea or vomiting.  You have continued burning or discomfort with urination. MAKE SURE YOU:   Understand these instructions.  Will watch your condition.  Will get help right away if you are not doing well or get worse. Document Released: 12/29/2004 Document Revised: 09/20/2011 Document Reviewed: 04/29/2011 [facility] Patient Information 2015 [place], [place]. This information is not intended to replace advice given to you by your health care provider. Make  sure you discuss any questions you have with your health care provider.  Sulfamethoxazole; Trimethoprim, SMX-TMP tablets What is this medicine? SULFAMETHOXAZOLE; TRIMETHOPRIM or SMX-TMP (suhl fuh meth OK suh zohl; trye METH oh prim) is a combination of a sulfonamide antibiotic and a second antibiotic, trimethoprim. It is used to treat or prevent certain  kinds of bacterial infections. It will not work for colds, flu, or other viral infections. This medicine may be used for other purposes; ask your health care provider or pharmacist if you have questions. COMMON BRAND NAME(S): Bacter-Aid DS, Bactrim, Bactrim DS, Septra, Septra DS What should I tell my health care provider before I take this medicine? They need to know if you have any of these conditions: -anemia -asthma -being treated with anticonvulsants -if you frequently drink alcohol containing drinks -kidney disease -liver disease -low level of folic acid or [id number] dehydrogenase -poor nutrition or malabsorption -porphyria -severe allergies -thyroid disorder -an unusual or allergic reaction to sulfamethoxazole, trimethoprim, sulfa drugs, other medicines, foods, dyes, or preservatives -pregnant or trying to get pregnant -breast-feeding How should I use this medicine? Take this medicine by mouth with a full glass of water. Follow the directions on the prescription label. Take your medicine at regular intervals. Do not take it more often than directed. Do not skip doses or stop your medicine early. Talk to your pediatrician regarding the use of this medicine in children. Special care may be needed. This medicine has been used in children as young as 40 months of age. Overdosage: If you think you have taken too much of this medicine contact a poison control center or emergency room at once. NOTE: This medicine is only for you. Do not share this medicine with others. What if I miss a dose? If you miss a dose, take it as soon as you can. If it is almost time for your next dose, take only that dose. Do not take double or extra doses. What may interact with this medicine? Do not take this medicine with any of the following medications: -aminobenzoate potassium -dofetilide -metronidazole This medicine may also interact with the following medications: -ACE inhibitors like  benazepril, enalapril, lisinopril, and ramipril -birth control pills -cyclosporine -digoxin -diuretics -indomethacin -medicines for diabetes -methenamine -methotrexate -phenytoin -potassium supplements -pyrimethamine -sulfinpyrazone -tricyclic antidepressants -warfarin This list may not describe all possible interactions. Give your health care provider a list of all the medicines, herbs, non-prescription drugs, or dietary supplements you use. Also tell them if you smoke, drink alcohol, or use illegal drugs. Some items may interact with your medicine. What should I watch for while using this medicine? Tell your doctor or health care professional if your symptoms do not improve. Drink several glasses of water a day to reduce the risk of kidney problems. Do not treat diarrhea with over the counter products. Contact your doctor if you have diarrhea that lasts more than 2 days or if it is severe and watery. This medicine can make you more sensitive to the sun. Keep out of the sun. If you cannot avoid being in the sun, wear protective clothing and use a sunscreen. Do not use sun lamps or tanning beds/booths. What side effects may I notice from receiving this medicine? Side effects that you should report to your doctor or health care professional as soon as possible: -allergic reactions like skin rash or hives, swelling of the face, lips, or tongue -breathing problems -fever or chills, sore throat -irregular heartbeat, chest pain -joint or muscle pain -pain or  difficulty passing urine -red pinpoint spots on skin -redness, blistering, peeling or loosening of the skin, including inside the mouth -unusual bleeding or bruising -unusually weak or tired -yellowing of the eyes or skin Side effects that usually do not require medical attention (report to your doctor or health care professional if they continue or are bothersome): -diarrhea -dizziness -headache -loss of appetite -nausea,  vomiting -nervousness This list may not describe all possible side effects. Call your doctor for medical advice about side effects. You may report side effects to FDA at 1-800-FDA-1088. Where should I keep my medicine? Keep out of the reach of children. Store at room temperature between 20 to 25 degrees C (68 to 77 degrees F). Protect from light. Throw away any unused medicine after the expiration date. NOTE: This sheet is a summary. It may not cover all possible information. If you have questions about this medicine, talk to your doctor, pharmacist, or health care provider.  2015, [employer]. (2012-10-26 14:38:26)

## 2014-02-06 NOTE — ED Provider Notes (Signed)
CSN: [id number]     Arrival date & time 02/06/14  0133 History   First MD Initiated Contact with Patient 02/06/14 [phone]     Chief Complaint  Patient presents with  . Urinary Tract Infection     (Consider location/radiation/quality/duration/timing/severity/associated sxs/prior Treatment) Patient is a Albert Garrison presenting with urinary tract infection. The history is provided by the patient.  Urinary Tract Infection  He has had several urinary tract infections recently. Today, he started having suprapubic pain with radiation to both flanks as well as urinary urgency and frequency and tenesmus. He has not had fever, chills, sweats. He denies nausea or vomiting. He rates pain at 3/10. Pain is worse when he urinates but nothing makes it any better.  History reviewed. No pertinent past medical history. Past Surgical History  Procedure Laterality Date  . Cervical spine surgery  2010  . Knee arthroscopy    . Carpal tunnel release    . Lumbar laminectomy     History reviewed. No pertinent family history. History  Substance Use Topics  . Smoking status: Former Research scientist (life sciences)  . Smokeless tobacco: Never Used  . Alcohol Use: 0.6 oz/week    1 Cans of beer per week     Comment: occ    Review of Systems  All other systems reviewed and are negative.     Allergies  Review of patient's allergies indicates no known allergies.  Home Medications   Prior to Admission medications   Medication Sig Start Date End Date Taking? Authorizing Provider  ibuprofen (ADVIL,MOTRIN) 200 MG tablet Take 200 mg by mouth every 6 (six) hours as needed for moderate pain.   Yes Historical Provider, MD  ciprofloxacin (CIPRO) 500 MG tablet Take 1 tablet (500 mg total) by mouth 2 (two) times daily. For 10 days 01/22/14   Tammy L. Triplett, PA-C  sulfamethoxazole-trimethoprim (SEPTRA DS) 800-160 MG per tablet Take 1 tablet by mouth 2 (two) times daily. For 7 days 01/06/14   Tammy L. Triplett, PA-C   BP 170/98 mmHg   Pulse 94  Temp(Src) 98.2 F (36.8 C) (Oral)  Resp 20  Ht 5' 11.5" (1.816 m)  Wt 170 lb (77.111 kg)  BMI 23.38 kg/m2  SpO2 95% Physical Exam  Nursing note and vitals reviewed.  Albert year old Garrison, resting comfortably and in no acute distress. Vital signs are significant for hypertension. Oxygen saturation is 95%, which is normal. Head is normocephalic and atraumatic. PERRLA, EOMI. Oropharynx is clear. Neck is nontender and supple without adenopathy or JVD. Back is nontender and there is no CVA tenderness. Lungs are clear without rales, wheezes, or rhonchi. Chest is nontender. Heart has regular rate and rhythm without murmur. Abdomen is soft, flat, with mild suprapubic tenderness. There are no masses or hepatosplenomegaly and peristalsis is normoactive. Extremities have no cyanosis or edema, full range of motion is present. Skin is warm and dry without rash. Neurologic: Mental status is normal, cranial nerves are intact, there are no motor or sensory deficits.  ED Course  Procedures (including critical care time) Labs Review Results for orders placed or performed during the hospital encounter of 02/06/14  U/A (may I&O cath if menses)  Result Value Ref Range   Color, Urine YELLOW YELLOW   APPearance HAZY (A) CLEAR   Specific Gravity, Urine 1.010 1.005 - 1.030   pH 6.0 5.0 - 8.0   Glucose, UA 100 (A) NEGATIVE mg/dL   Hgb urine dipstick MODERATE (A) NEGATIVE   Bilirubin Urine NEGATIVE NEGATIVE  Ketones, ur NEGATIVE NEGATIVE mg/dL   Protein, ur 100 (A) NEGATIVE mg/dL   Urobilinogen, UA 0.2 0.0 - 1.0 mg/dL   Nitrite NEGATIVE NEGATIVE   Leukocytes, UA LARGE (A) NEGATIVE  Urine microscopic-add on  Result Value Ref Range   WBC, UA TOO NUMEROUS TO COUNT <3 WBC/hpf   RBC / HPF 3-6 <3 RBC/hpf   Bacteria, UA MANY (A) RARE   MDM   Final diagnoses:  Urinary tract infection without hematuria, site unspecified    Urinary tract infection, recurrent. Old records are reviewed and he  has 3 urine cultures in the last 2 months that have all grown Serratia marcescens with the same sensitivity profile (sensitive to levofloxacin and trimethoprim-sulfamethoxazole). This indicates that he has not completely eradicated the original infection and will require long-term antibiotic treatment-probably for 6 weeks to 3 months. Urine culture is sent today and he is given a prescription for trimethoprim-sulfamethoxazole. He is referred back to his PCP and is also referred to urology.    Delora Fuel, MD 42/35/36 1443

## 2014-02-10 LAB — URINE CULTURE: Colony Count: >=100000

## 2014-02-11 ENCOUNTER — Telehealth (HOSPITAL_COMMUNITY): Payer: Self-pay

## 2014-02-11 NOTE — ED Notes (Signed)
Post ED Visit - Positive Culture Follow-up  Culture report reviewed by antimicrobial stewardship pharmacist: []  Wes [place], [place].D., BCPS [x]  Heide Guile, Pharm.D., BCPS []  Alycia Rossetti, Pharm.D., BCPS []  [place], [place].D., BCPS, AAHIVP []  Legrand Como, Pharm.D., BCPS, AAHIVP []  Elicia Lamp, Pharm.D.   Positive urine culture Treated with bactrim DS, organism sensitive to the same and no further patient follow-up is required at this time.  Ileene Musa 02/11/2014, 11:42 AM

## 2014-03-04 DIAGNOSIS — N390 Urinary tract infection, site not specified: Secondary | ICD-10-CM

## 2014-03-04 HISTORY — DX: Urinary tract infection, site not specified: N39.0

## 2014-03-20 DIAGNOSIS — Z978 Presence of other specified devices: Secondary | ICD-10-CM

## 2014-03-20 HISTORY — DX: Presence of other specified devices: Z97.8

## 2014-03-24 ENCOUNTER — Inpatient Hospital Stay (HOSPITAL_COMMUNITY): Payer: Medicare Other

## 2014-03-24 ENCOUNTER — Inpatient Hospital Stay (HOSPITAL_COMMUNITY)
Admission: EM | Admit: 2014-03-24 | Discharge: 2014-03-28 | DRG: 640 | Disposition: A | Discharge status: Skilled Nursing Facility | Payer: Medicare Other | Attending: Internal Medicine | Admitting: Internal Medicine

## 2014-03-24 ENCOUNTER — Encounter (HOSPITAL_COMMUNITY): Payer: Self-pay | Admitting: Emergency Medicine

## 2014-03-24 DIAGNOSIS — Z23 Encounter for immunization: Secondary | ICD-10-CM

## 2014-03-24 DIAGNOSIS — T370X5A Adverse effect of sulfonamides, initial encounter: Secondary | ICD-10-CM | POA: Diagnosis present

## 2014-03-24 DIAGNOSIS — Z87440 Personal history of urinary (tract) infections: Secondary | ICD-10-CM | POA: Diagnosis not present

## 2014-03-24 DIAGNOSIS — Z87891 Personal history of nicotine dependence: Secondary | ICD-10-CM

## 2014-03-24 DIAGNOSIS — E872 Acidosis: Secondary | ICD-10-CM | POA: Diagnosis present

## 2014-03-24 DIAGNOSIS — G4730 Sleep apnea, unspecified: Secondary | ICD-10-CM | POA: Diagnosis present

## 2014-03-24 DIAGNOSIS — E860 Dehydration: Secondary | ICD-10-CM | POA: Diagnosis present

## 2014-03-24 DIAGNOSIS — K5900 Constipation, unspecified: Secondary | ICD-10-CM | POA: Diagnosis present

## 2014-03-24 DIAGNOSIS — F319 Bipolar disorder, unspecified: Secondary | ICD-10-CM | POA: Diagnosis present

## 2014-03-24 DIAGNOSIS — R404 Transient alteration of awareness: Secondary | ICD-10-CM

## 2014-03-24 DIAGNOSIS — I129 Hypertensive chronic kidney disease with stage 1 through stage 4 chronic kidney disease, or unspecified chronic kidney disease: Secondary | ICD-10-CM | POA: Diagnosis present

## 2014-03-24 DIAGNOSIS — N183 Chronic kidney disease, stage 3 (moderate): Secondary | ICD-10-CM | POA: Diagnosis present

## 2014-03-24 DIAGNOSIS — E871 Hypo-osmolality and hyponatremia: Secondary | ICD-10-CM | POA: Diagnosis present

## 2014-03-24 DIAGNOSIS — Z794 Long term (current) use of insulin: Secondary | ICD-10-CM | POA: Diagnosis not present

## 2014-03-24 DIAGNOSIS — E861 Hypovolemia: Secondary | ICD-10-CM | POA: Diagnosis present

## 2014-03-24 DIAGNOSIS — N179 Acute kidney failure, unspecified: Secondary | ICD-10-CM | POA: Diagnosis present

## 2014-03-24 DIAGNOSIS — I2510 Atherosclerotic heart disease of native coronary artery without angina pectoris: Secondary | ICD-10-CM | POA: Diagnosis present

## 2014-03-24 DIAGNOSIS — N390 Urinary tract infection, site not specified: Secondary | ICD-10-CM | POA: Diagnosis present

## 2014-03-24 DIAGNOSIS — T39395A Adverse effect of other nonsteroidal anti-inflammatory drugs [NSAID], initial encounter: Secondary | ICD-10-CM | POA: Diagnosis present

## 2014-03-24 DIAGNOSIS — E1165 Type 2 diabetes mellitus with hyperglycemia: Secondary | ICD-10-CM | POA: Diagnosis present

## 2014-03-24 DIAGNOSIS — R4182 Altered mental status, unspecified: Secondary | ICD-10-CM | POA: Diagnosis not present

## 2014-03-24 DIAGNOSIS — N170 Acute kidney failure with tubular necrosis: Secondary | ICD-10-CM

## 2014-03-24 DIAGNOSIS — D649 Anemia, unspecified: Secondary | ICD-10-CM | POA: Diagnosis present

## 2014-03-24 DIAGNOSIS — G9341 Metabolic encephalopathy: Secondary | ICD-10-CM | POA: Diagnosis present

## 2014-03-24 DIAGNOSIS — G9340 Encephalopathy, unspecified: Secondary | ICD-10-CM | POA: Diagnosis present

## 2014-03-24 DIAGNOSIS — E119 Type 2 diabetes mellitus without complications: Secondary | ICD-10-CM

## 2014-03-24 DIAGNOSIS — IMO0001 Reserved for inherently not codable concepts without codable children: Secondary | ICD-10-CM

## 2014-03-24 HISTORY — DX: Essential (primary) hypertension: I10

## 2014-03-24 LAB — CBC WITH DIFFERENTIAL/PLATELET
BASOS ABS: 0 10*3/uL (ref 0.0–0.1)
BASOS PCT: 0 % (ref 0–1)
Eosinophils Absolute: 0 10*3/uL (ref 0.0–0.7)
Eosinophils Relative: 0 % (ref 0–5)
HCT: 35.1 % — ABNORMAL LOW (ref 39.0–52.0)
Hemoglobin: 12.9 g/dL — ABNORMAL LOW (ref 13.0–17.0)
Lymphocytes Relative: 6 % — ABNORMAL LOW (ref 12–46)
Lymphs Abs: 1.5 10*3/uL (ref 0.7–4.0)
MCH: 29.4 pg (ref 26.0–34.0)
MCHC: 36.8 g/dL — AB (ref 30.0–36.0)
MCV: 80 fL (ref 78.0–100.0)
Monocytes Absolute: 1.9 10*3/uL — ABNORMAL HIGH (ref 0.1–1.0)
Monocytes Relative: 7 % (ref 3–12)
NEUTROS ABS: 23.4 10*3/uL — AB (ref 1.7–7.7)
Neutrophils Relative %: 87 % — ABNORMAL HIGH (ref 43–77)
Platelets: 607 10*3/uL — ABNORMAL HIGH (ref 150–400)
RBC: 4.39 MIL/uL (ref 4.22–5.81)
RDW: 15.6 % — AB (ref 11.5–15.5)
WBC: 26.9 10*3/uL — ABNORMAL HIGH (ref 4.0–10.5)

## 2014-03-24 LAB — URINALYSIS, ROUTINE W REFLEX MICROSCOPIC
Bilirubin Urine: NEGATIVE
GLUCOSE, UA: NEGATIVE mg/dL
KETONES UR: NEGATIVE mg/dL
Nitrite: NEGATIVE
PH: 6.5 (ref 5.0–8.0)
Protein, ur: NEGATIVE mg/dL
Specific Gravity, Urine: <1.005 — ABNORMAL LOW (ref 1.005–1.030)
Urobilinogen, UA: 0.2 mg/dL (ref 0.0–1.0)

## 2014-03-24 LAB — BASIC METABOLIC PANEL
ANION GAP: 19 — AB (ref 5–15)
Anion gap: 18 — ABNORMAL HIGH (ref 5–15)
Anion gap: 19 — ABNORMAL HIGH (ref 5–15)
Anion gap: 19 — ABNORMAL HIGH (ref 5–15)
BUN: 33 mg/dL — ABNORMAL HIGH (ref 6–23)
BUN: 33 mg/dL — AB (ref 6–23)
BUN: 33 mg/dL — AB (ref 6–23)
BUN: 33 mg/dL — AB (ref 6–23)
CALCIUM: 8.3 mg/dL — AB (ref 8.4–10.5)
CALCIUM: 8.2 mg/dL — AB (ref 8.4–10.5)
CO2: 19 mEq/L (ref 19–32)
CO2: 20 mEq/L (ref 19–32)
CO2: 18 mEq/L — ABNORMAL LOW (ref 19–32)
CO2: 17 mEq/L — ABNORMAL LOW (ref 19–32)
CREATININE: 2.25 mg/dL — AB (ref 0.50–1.35)
CREATININE: 2.22 mg/dL — AB (ref 0.50–1.35)
Calcium: 8.5 mg/dL (ref 8.4–10.5)
Calcium: 8.2 mg/dL — ABNORMAL LOW (ref 8.4–10.5)
Chloride: 67 mEq/L — ABNORMAL LOW (ref 96–112)
Chloride: 69 mEq/L — ABNORMAL LOW (ref 96–112)
Chloride: 69 mEq/L — ABNORMAL LOW (ref 96–112)
Chloride: 70 mEq/L — ABNORMAL LOW (ref 96–112)
Creatinine, Ser: 2.33 mg/dL — ABNORMAL HIGH (ref 0.50–1.35)
Creatinine, Ser: 2.24 mg/dL — ABNORMAL HIGH (ref 0.50–1.35)
GFR calc Af Amer: 31 mL/min — ABNORMAL LOW (ref >90)
GFR calc non Af Amer: 27 mL/min — ABNORMAL LOW (ref >90)
GFR, EST AFRICAN AMERICAN: 32 mL/min — AB (ref >90)
GFR, EST AFRICAN AMERICAN: 33 mL/min — AB (ref >90)
GFR, EST AFRICAN AMERICAN: 33 mL/min — AB (ref >90)
GFR, EST NON AFRICAN AMERICAN: 28 mL/min — AB (ref >90)
GFR, EST NON AFRICAN AMERICAN: 28 mL/min — AB (ref >90)
GFR, EST NON AFRICAN AMERICAN: 28 mL/min — AB (ref >90)
Glucose, Bld: 226 mg/dL — ABNORMAL HIGH (ref 70–99)
Glucose, Bld: 235 mg/dL — ABNORMAL HIGH (ref 70–99)
Glucose, Bld: 288 mg/dL — ABNORMAL HIGH (ref 70–99)
Glucose, Bld: 260 mg/dL — ABNORMAL HIGH (ref 70–99)
POTASSIUM: 4.3 meq/L (ref 3.7–5.3)
Potassium: 4.7 mEq/L (ref 3.7–5.3)
Potassium: 4.3 mEq/L (ref 3.7–5.3)
Potassium: 4.5 mEq/L (ref 3.7–5.3)
Sodium: 105 mEq/L — CL (ref 137–147)
Sodium: 107 mEq/L — CL (ref 137–147)
Sodium: 106 mEq/L — CL (ref 137–147)
Sodium: 106 mEq/L — CL (ref 137–147)

## 2014-03-24 LAB — COMPREHENSIVE METABOLIC PANEL
ALK PHOS: 121 U/L — AB (ref 39–117)
ALT: 35 U/L (ref 0–53)
AST: 34 U/L (ref 0–37)
Albumin: 2.9 g/dL — ABNORMAL LOW (ref 3.5–5.2)
Anion gap: 20 — ABNORMAL HIGH (ref 5–15)
BILIRUBIN TOTAL: 0.8 mg/dL (ref 0.3–1.2)
BUN: 33 mg/dL — ABNORMAL HIGH (ref 6–23)
CHLORIDE: 66 meq/L — AB (ref 96–112)
CO2: 19 mEq/L (ref 19–32)
Calcium: 8.8 mg/dL (ref 8.4–10.5)
Creatinine, Ser: 2.34 mg/dL — ABNORMAL HIGH (ref 0.50–1.35)
GFR calc Af Amer: 31 mL/min — ABNORMAL LOW (ref >90)
GFR calc non Af Amer: 27 mL/min — ABNORMAL LOW (ref >90)
Glucose, Bld: 228 mg/dL — ABNORMAL HIGH (ref 70–99)
POTASSIUM: 4.7 meq/L (ref 3.7–5.3)
SODIUM: 105 meq/L — AB (ref 137–147)
Total Protein: 7.5 g/dL (ref 6.0–8.3)

## 2014-03-24 LAB — URINE MICROSCOPIC-ADD ON

## 2014-03-24 LAB — SODIUM, URINE, RANDOM: Sodium, Ur: 29 mEq/L

## 2014-03-24 MED ORDER — PNEUMOCOCCAL VAC POLYVALENT 25 MCG/0.5ML IJ INJ
0.5000 mL | INJECTION | INTRAMUSCULAR | Status: AC
  Administered 2014-03-25: 0.5 mL via INTRAMUSCULAR
  Filled 2014-03-24: qty 0.5

## 2014-03-24 MED ORDER — DEXTROSE 5 % IV SOLN
1.0000 g | INTRAVENOUS | Status: DC
  Administered 2014-03-24 – 2014-03-27 (×4): 1 g via INTRAVENOUS
  Filled 2014-03-24 (×7): qty 10

## 2014-03-24 MED ORDER — URELLE 81 MG PO TABS
1.0000 | ORAL_TABLET | Freq: Four times a day (QID) | ORAL | Status: DC | PRN
  Filled 2014-03-24: qty 1

## 2014-03-24 MED ORDER — INFLUENZA VAC SPLIT QUAD 0.5 ML IM SUSY
0.5000 mL | PREFILLED_SYRINGE | INTRAMUSCULAR | Status: AC
  Administered 2014-03-25: 0.5 mL via INTRAMUSCULAR
  Filled 2014-03-24: qty 0.5

## 2014-03-24 MED ORDER — SODIUM CHLORIDE 0.9 % IJ SOLN: 3.0000 mL | Freq: Two times a day (BID) | INTRAMUSCULAR | Status: DC

## 2014-03-24 MED ORDER — ONDANSETRON HCL 4 MG PO TABS: 4.0000 mg | ORAL_TABLET | Freq: Four times a day (QID) | ORAL | Status: DC | PRN

## 2014-03-24 MED ORDER — ONDANSETRON HCL 4 MG/2ML IJ SOLN: 4.0000 mg | Freq: Four times a day (QID) | INTRAMUSCULAR | Status: DC | PRN

## 2014-03-24 MED ORDER — ENSURE COMPLETE PO LIQD
237.0000 mL | Freq: Two times a day (BID) | ORAL | Status: DC
Start: 2014-03-25 — End: 2014-03-28
  Administered 2014-03-25 – 2014-03-28 (×5): 237 mL via ORAL

## 2014-03-24 MED ORDER — SODIUM CHLORIDE 0.9 % IV SOLN
INTRAVENOUS | Status: DC
  Administered 2014-03-24: 17:00:00 via INTRAVENOUS

## 2014-03-24 MED ORDER — SODIUM CHLORIDE 0.9 % IV SOLN
Freq: Once | INTRAVENOUS | Status: AC
  Administered 2014-03-24: 16:00:00 via INTRAVENOUS

## 2014-03-24 MED ORDER — OXYCODONE-ACETAMINOPHEN 5-325 MG PO TABS
1.0000 | ORAL_TABLET | Freq: Once | ORAL | Status: AC
  Administered 2014-03-24: 1 via ORAL
  Filled 2014-03-24: qty 1

## 2014-03-24 MED ORDER — ENOXAPARIN SODIUM 30 MG/0.3ML ~~LOC~~ SOLN
30.0000 mg | SUBCUTANEOUS | Status: DC
  Administered 2014-03-24: 30 mg via SUBCUTANEOUS
  Filled 2014-03-24: qty 0.3

## 2014-03-24 NOTE — H&P (Addendum)
[facility] History and Physical  Albert Garrison MHD:[id number] DOB: [birth year] DOA: 03/24/2014  Referring physician: ER PCP: Madelyn Brunner, MD   Chief Complaint: Altered mental status, hyponatremia  HPI: Albert Garrison is a 68 y.o. male  This is a 68 year old man who has been complaining of urinary increased frequency and dysuria for the last week or so. He has had poor by mouth intake. He has documented UTI previously. He has had chills, nausea and also vomiting. He has had some nonspecific abdominal discomfort. There is no diarrhea, rectal bleeding. Evaluation in the emergency room shows him to have urinalysis consistent with UTI and significant hyponatremia with a sodium of only 105. He is not on any diuretics. He is now being admitted for further management. His aunt, who is present at the bedside, says he has been more confused in the last week or so, has had some difficulty with gait but no actual limb weakness. The patient himself does not describe any difficulties with speech or vision and does admit to being somewhat unstable with his gait. He has not had any falls.   Review of Systems:  Apart from symptoms mentioned above, all other systems are negative.  Past Medical History  Diagnosis Date  . Hypertension    Past Surgical History  Procedure Laterality Date  . Cervical spine surgery  2010  . Knee arthroscopy    . Carpal tunnel release    . Lumbar laminectomy     Social History:  reports that he has quit smoking. He has never used smokeless tobacco. He reports that he drinks about 0.6 oz of alcohol per week. He reports that he does not use illicit drugs.  No Known Allergies  Family history: No family history of cardiac/respiratory problems.  Prior to Admission medications   Medication Sig Start Date End Date Taking? Authorizing Provider  ciprofloxacin (CIPRO) 500 MG tablet Take 1 tablet (500 mg total) by mouth 2 (two) times daily. For 10 days Patient not  taking: Reported on 03/24/2014 01/22/14   Tammy L. Triplett, PA-C  ibuprofen (ADVIL,MOTRIN) 200 MG tablet Take 200 mg by mouth every 6 (six) hours as needed for moderate pain.    Historical Provider, MD  sulfamethoxazole-trimethoprim (SEPTRA DS) 800-160 MG per tablet Take 1 tablet by mouth 2 (two) times daily. Patient not taking: Reported on 03/24/2014 21/1/94   Delora Fuel, MD   Physical Exam: Filed Vitals:   03/24/14 1400 03/24/14 1415 03/24/14 1500 03/24/14 1530  BP: 145/82  144/79 148/76  Pulse: 60  64 73  Temp:      TempSrc:      Resp: 18 18 18 16   Height:      Weight:      SpO2: 98%  98% 98%    Wt Readings from Last 3 Encounters:  03/24/14 68.04 kg (150 lb)  02/06/14 77.111 kg (170 lb)  01/22/14 90.719 kg (200 lb)    General:  Appears calm and comfortable. Appears to be alert and orientated to me at the present time. Does look somewhat dehydrated. Eyes: PERRL, normal lids, irises & conjunctiva ENT: grossly normal hearing, lips & tongue Neck: no LAD, masses or thyromegaly Cardiovascular: RRR, no m/r/g. No LE edema. Telemetry: SR, no arrhythmias  Respiratory: CTA bilaterally, no w/r/r. Normal respiratory effort. Abdomen: soft, ntnd Skin: no rash or induration seen on limited exam Musculoskeletal: grossly normal tone BUE/BLE Psychiatric: grossly normal mood and affect, speech fluent and appropriate Neurologic: grossly non-focal.  Labs on Admission:  Basic Metabolic Panel:  Recent Labs Lab 03/24/14 1301 03/24/14 1420  NA 105* 105*  K 4.7 4.7  CL 66* 67*  CO2 19 19  GLUCOSE 228* 226*  BUN 33* 33*  CREATININE 2.34* 2.33*  CALCIUM 8.8 8.5   Liver Function Tests:  Recent Labs Lab 03/24/14 1301  AST 34  ALT 35  ALKPHOS 121*  BILITOT 0.8  PROT 7.5  ALBUMIN 2.9*   No results for input(s): LIPASE, AMYLASE in the last 168 hours. No results for input(s): AMMONIA in the last 168 hours. CBC:  Recent Labs Lab 03/24/14 1301  WBC 26.9*  NEUTROABS  23.4*  HGB 12.9*  HCT 35.1*  MCV 80.0  PLT 607*   Cardiac Enzymes: No results for input(s): CKTOTAL, CKMB, CKMBINDEX, TROPONINI in the last 168 hours.  BNP (last 3 results) No results for input(s): PROBNP in the last 8760 hours. CBG: No results for input(s): GLUCAP in the last 168 hours.  Radiological Exams on Admission: Dg Chest 2 View  03/24/2014   CLINICAL DATA:  Hyponatremia, hypertension  EXAM: CHEST  2 VIEW  COMPARISON:  05/08/2008  FINDINGS: Cardiomediastinal silhouette is stable. No acute infiltrate or pleural effusion. No pulmonary edema. Mild elevation of the right hemidiaphragm. Degenerative changes mid and lower thoracic spine. Metallic fixation plate partially visualized cervical spine.  IMPRESSION: No active cardiopulmonary disease.   Electronically Signed   By: Lahoma Crocker M.D.   On: 03/24/2014 16:38      Assessment/Plan   1. Significant hyponatremia. Sodium is 105. I think this probably represents hypovolemia and dehydration. I'm going to treat him with intravenous normal saline. Chest x-ray does not show any lung pathology. If his sodium does not improve with conservative measures with IV fluids, I would consider imaging his brain. We will obtain nephrology consultation. 2. UTI-will treat with intravenous antibiotics.  Further recommendations will depend on patient's hospital progress.   Code Status: Full code  DVT Prophylaxis: Lovenox  Family Communication: I discussed the plan with the patient and patient's aunt.  Disposition Plan: Home when medically stable.  Time spent: 60 minutes.  Doree Albee [facility] Pager [phone].

## 2014-03-24 NOTE — ED Notes (Signed)
Report given to Lattie Haw, RN for room 310.

## 2014-03-24 NOTE — Consult Note (Signed)
Reason for Consult: Hyponatremia and renal failure Referring Physician: Dr. Letitia Garrison is an 68 y.o. male.  HPI: Albert Garrison is a patient who has history of non-obstructing coronary artery disease, history of diabetes, history of bipolar disorder presently was brought because of confusion and severe hyponatremia. According to patient Albert Garrison is having recurrent urinary tract infection with some urgency and frequency. According the patient was treated with antibiotics and his problem comes back whenever Albert Garrison finishes her antibiotics. Albert Garrison complains of low-grade fever and some chills. This has been going on for the last 2 months. Patient has episode of diarrhea. Presently Albert Garrison denies any nausea vomiting. Patient also says that Albert Garrison is feeling thirsty.  Past Medical History  Diagnosis Date  . Hypertension     Past Surgical History  Procedure Laterality Date  . Cervical spine surgery  2010  . Knee arthroscopy    . Carpal tunnel release    . Lumbar laminectomy      History reviewed. No pertinent family history.  Social History:  reports that Albert Garrison has quit smoking. Albert Garrison has never used smokeless tobacco. Albert Garrison reports that Albert Garrison drinks about 1.8 oz of alcohol per week. Albert Garrison reports that Albert Garrison does not use illicit drugs.  Allergies: No Known Allergies  Medications: I have reviewed the patient's current medications.  Results for orders placed or performed during the hospital encounter of 03/24/14 (from the past 48 hour(s))  CBC with Differential     Status: Abnormal   Collection Time: 03/24/14  1:01 PM  Result Value Ref Range   WBC 26.9 (H) 4.0 - 10.5 K/uL    Comment: INCREASED BANDS (>20% BANDS) ABSOLUTE LYMPHOCYTOSIS ATYPICAL LYMPHOCYTES    RBC 4.39 4.22 - 5.81 MIL/uL   Hemoglobin 12.9 (L) 13.0 - 17.0 g/dL   HCT 35.1 (L) 39.0 - 52.0 %   MCV 80.0 78.0 - 100.0 fL   MCH 29.4 26.0 - 34.0 pg   MCHC 36.8 (H) 30.0 - 36.0 g/dL   RDW 15.6 (H) 11.5 - 15.5 %   Platelets 607 (H) 150 - 400 K/uL    Comment: PLATELET  COUNT CONFIRMED BY SMEAR SPECIMEN CHECKED FOR CLOTS GIANT PLATELETS SEEN PLATELETS APPEAR INCREASED    Neutrophils Relative % 87 (H) 43 - 77 %   Neutro Abs 23.4 (H) 1.7 - 7.7 K/uL   Lymphocytes Relative 6 (L) 12 - 46 %   Lymphs Abs 1.5 0.7 - 4.0 K/uL   Monocytes Relative 7 3 - 12 %   Monocytes Absolute 1.9 (H) 0.1 - 1.0 K/uL   Eosinophils Relative 0 0 - 5 %   Eosinophils Absolute 0.0 0.0 - 0.7 K/uL   Basophils Relative 0 0 - 1 %   Basophils Absolute 0.0 0.0 - 0.1 K/uL  Comprehensive metabolic panel     Status: Abnormal   Collection Time: 03/24/14  1:01 PM  Result Value Ref Range   Sodium 105 (LL) 137 - 147 mEq/L    Comment: CRITICAL RESULT CALLED TO, READ BACK BY AND VERIFIED WITH: OSBORNE T. AT 1403 ON [id number] BY THOMPSON S    Potassium 4.7 3.7 - 5.3 mEq/L   Chloride 66 (L) 96 - 112 mEq/L   CO2 19 19 - 32 mEq/L   Glucose, Bld 228 (H) 70 - 99 mg/dL   BUN 33 (H) 6 - 23 mg/dL   Creatinine, Ser 2.34 (H) 0.50 - 1.35 mg/dL   Calcium 8.8 8.4 - 10.5 mg/dL   Total Protein 7.5 6.0 - 8.3  g/dL   Albumin 2.9 (L) 3.5 - 5.2 g/dL   AST 34 0 - 37 U/L   ALT 35 0 - 53 U/L   Alkaline Phosphatase 121 (H) 39 - 117 U/L   Total Bilirubin 0.8 0.3 - 1.2 mg/dL   GFR calc non Af Amer 27 (L) >90 mL/min   GFR calc Af Amer 31 (L) >90 mL/min    Comment: (NOTE) The eGFR has been calculated using the CKD EPI equation. This calculation has not been validated in all clinical situations. eGFR's persistently <90 mL/min signify possible Chronic Kidney Disease.    Anion gap 20 (H) 5 - 15  Urinalysis, Routine w reflex microscopic     Status: Abnormal   Collection Time: 03/24/14  1:20 PM  Result Value Ref Range   Color, Urine YELLOW YELLOW   APPearance CLOUDY (A) CLEAR   Specific Gravity, Urine <1.005 (L) 1.005 - 1.030   pH 6.5 5.0 - 8.0   Glucose, UA NEGATIVE NEGATIVE mg/dL   Hgb urine dipstick MODERATE (A) NEGATIVE   Bilirubin Urine NEGATIVE NEGATIVE   Ketones, ur NEGATIVE NEGATIVE mg/dL   Protein, ur  NEGATIVE NEGATIVE mg/dL   Urobilinogen, UA 0.2 0.0 - 1.0 mg/dL   Nitrite NEGATIVE NEGATIVE   Leukocytes, UA LARGE (A) NEGATIVE  Urine microscopic-add on     Status: Abnormal   Collection Time: 03/24/14  1:20 PM  Result Value Ref Range   WBC, UA TOO NUMEROUS TO COUNT <3 WBC/hpf   RBC / HPF 21-50 <3 RBC/hpf   Bacteria, UA MANY (A) RARE  Basic metabolic panel     Status: Abnormal   Collection Time: 03/24/14  2:20 PM  Result Value Ref Range   Sodium 105 (LL) 137 - 147 mEq/L    Comment: CRITICAL RESULT CALLED TO, READ BACK BY AND VERIFIED WITH: GOBLE J. AT 1515 ON [id number] BY THOMPSON S.    Potassium 4.7 3.7 - 5.3 mEq/L   Chloride 67 (L) 96 - 112 mEq/L   CO2 19 19 - 32 mEq/L   Glucose, Bld 226 (H) 70 - 99 mg/dL   BUN 33 (H) 6 - 23 mg/dL   Creatinine, Ser 2.33 (H) 0.50 - 1.35 mg/dL   Calcium 8.5 8.4 - 10.5 mg/dL   GFR calc non Af Amer 27 (L) >90 mL/min   GFR calc Af Amer 31 (L) >90 mL/min    Comment: (NOTE) The eGFR has been calculated using the CKD EPI equation. This calculation has not been validated in all clinical situations. eGFR's persistently <90 mL/min signify possible Chronic Kidney Disease.    Anion gap 19 (H) 5 - 15    Dg Chest 2 View  03/24/2014   CLINICAL DATA:  Hyponatremia, hypertension  EXAM: CHEST  2 VIEW  COMPARISON:  05/08/2008  FINDINGS: Cardiomediastinal silhouette is stable. No acute infiltrate or pleural effusion. No pulmonary edema. Mild elevation of the right hemidiaphragm. Degenerative changes mid and lower thoracic spine. Metallic fixation plate partially visualized cervical spine.  IMPRESSION: No active cardiopulmonary disease.   Electronically Signed   By: Lahoma Crocker M.D.   On: 03/24/2014 16:38    Review of Systems  Constitutional: Positive for fever.  Respiratory: Negative for cough and shortness of breath.   Cardiovascular: Negative for orthopnea.  Gastrointestinal: Positive for diarrhea. Negative for nausea and vomiting.       Feels thirsty   Genitourinary: Positive for urgency and frequency.  Neurological: Positive for weakness.   Blood pressure 156/73, pulse 67, temperature  97.9 F (36.6 C), temperature source Oral, resp. rate 18, height 5' 11" (1.803 m), weight 68.04 kg (150 lb), SpO2 98 %. Physical Exam  Constitutional: No distress.  Patient is alert and oriented to time and place  Neck: No JVD present.  Cardiovascular: Normal rate and regular rhythm.   No murmur heard. Respiratory: No respiratory distress. Albert Garrison has no wheezes.  GI: Albert Garrison exhibits no distension. There is no rebound.  Musculoskeletal: Albert Garrison exhibits no edema.    Assessment/Plan: Problem #1 hyponatremia: Presently seems to be chronic. His sodium about 2 months ago was 129. Etiology as this moment is no clear but seems to be hypovolemic hyponatremia versus SIADH/ Polydepsic hyponatremia. Patient presently seems to be oriented to time and place. Patient now that Albert Garrison is at [facility]. Problem #2 renal failure: Possibly acute on chronic. This could be secondary to prerenal/NSAID/Bactrim. Problem #3 possible underlying chronic renal failure. His creatinine was 1.57 about 2 months ago with EGFR of 43 mL/m. Etiology could be secondary to diabetes/ATN/NSAID. Problem #4 bipolar disorder Problem #5 diabetes Problem #6 recurrent urine tract infection Problem #7 possible metabolic acidosis Problem #8 nonobstructive coronary artery disease Problem#9 Previous history of Parkison's disease/dementia? Problem#10 Historyy of sleep apnea Plan:1] We'll check serum osmolality, urine osmole and 20, urine sodium 2]We'll check his basic metabolic panel after 1 hour Since patient has this moment does not seems to be symptomatic we'll hold 3% saline until check the next blood work. If it continues to be low and without any improvement with use 3% saline until his sodium 115 at a rate of not exceeding 1 to 11/2 milliequivalent per hour 3 we will check his BMET every 1 hour x 2 and  then every 2 hours x2   [facility] S 03/24/2014, 5:42 PM

## 2014-03-24 NOTE — Progress Notes (Signed)
Pt has attempted to void three times since 1900, without success. Pt has the urge to void at this time. Notified Dr Anastasio Champion, who ordered to place foley catheter at this time.

## 2014-03-24 NOTE — ED Notes (Signed)
PT c/o urinary retention, increased frequency and burning x7 days. PT states nausea/vomiting x1 day and some constipation x3 days.

## 2014-03-24 NOTE — ED Notes (Signed)
CRITICAL VALUE ALERT  Critical value received:  Sodium  Date of notification:  03/24/14  Time of notification:  4707  Critical value read back:Yes.    Nurse who received alert:  OsborneRN  MD notified (1st page):  Polina  Verbal order to redraw labs and retest.

## 2014-03-24 NOTE — Progress Notes (Signed)
Pt c/o pain at this time. No pain medication on board. Notified Dr Anastasio Champion regarding this. Awaiting to hear back from Dr.

## 2014-03-24 NOTE — ED Notes (Signed)
Hospitalist at bedside at this time 

## 2014-03-24 NOTE — ED Notes (Addendum)
Pt reports has been seen for same in the past and px x3 abx for uti. Pt reports continued generalized abdominal pain, dry heaves, and constipation. NAD noted. Pt family reports is supposed to follow-up with urology but has not done so.

## 2014-03-24 NOTE — ED Provider Notes (Signed)
CSN: [id number]     Arrival date & time 03/24/14  1142 History   First MD Initiated Contact with Patient 03/24/14 1302     Chief Complaint  Patient presents with  . Abdominal Pain     (Consider location/radiation/quality/duration/timing/severity/associated sxs/prior Treatment) HPI Comments: Patient has been experiencing urinary frequency and burning with urination for one week. He was trying to get in to see his urologist this week but could not get an appointment. Sporting patient was unable to pass his urine. Caregiver reports that he did vomit once today and seemed to be experiencing chills earlier. Patient has had recurrent urinary tract infection in the past with similar symptoms.  Patient is a 68 y.o. male presenting with abdominal pain.  Abdominal Pain Associated symptoms: chills, dysuria, nausea and vomiting     Past Medical History  Diagnosis Date  . Hypertension    Past Surgical History  Procedure Laterality Date  . Cervical spine surgery  2010  . Knee arthroscopy    . Carpal tunnel release    . Lumbar laminectomy     History reviewed. No pertinent family history. History  Substance Use Topics  . Smoking status: Former Research scientist (life sciences)  . Smokeless tobacco: Never Used  . Alcohol Use: 0.6 oz/week    1 Cans of beer per week     Comment: occ    Review of Systems  Constitutional: Positive for chills.  Gastrointestinal: Positive for nausea, vomiting and abdominal pain.  Genitourinary: Positive for dysuria, frequency and decreased urine volume.  All other systems reviewed and are negative.     Allergies  Review of patient's allergies indicates no known allergies.  Home Medications   Prior to Admission medications   Medication Sig Start Date End Date Taking? Authorizing Provider  ciprofloxacin (CIPRO) 500 MG tablet Take 1 tablet (500 mg total) by mouth 2 (two) times daily. For 10 days Patient not taking: Reported on 03/24/2014 01/22/14   Tammy L. Triplett, PA-C   ibuprofen (ADVIL,MOTRIN) 200 MG tablet Take 200 mg by mouth every 6 (six) hours as needed for moderate pain.    Historical Provider, MD  sulfamethoxazole-trimethoprim (SEPTRA DS) 800-160 MG per tablet Take 1 tablet by mouth 2 (two) times daily. Patient not taking: Reported on 03/24/2014 88/3/25   Delora Fuel, MD   BP 498/26 mmHg  Pulse 60  Temp(Src) 97.8 F (36.6 C) (Oral)  Resp 18  Ht 5\' 11"  (1.803 m)  Wt 150 lb (68.04 kg)  BMI 20.93 kg/m2  SpO2 98% Physical Exam  Constitutional: He is oriented to person, place, and time. He appears well-developed and well-nourished. No distress.  HENT:  Head: Normocephalic and atraumatic.  Right Ear: Hearing normal.  Left Ear: Hearing normal.  Nose: Nose normal.  Mouth/Throat: Oropharynx is clear and moist and mucous membranes are normal.  Eyes: Conjunctivae and EOM are normal. Pupils are equal, round, and reactive to light.  Neck: Normal range of motion. Neck supple.  Cardiovascular: Regular rhythm, S1 normal and S2 normal.  Exam reveals no gallop and no friction rub.   No murmur heard. Pulmonary/Chest: Effort normal and breath sounds normal. No respiratory distress. He exhibits no tenderness.  Abdominal: Soft. Normal appearance and bowel sounds are normal. There is no hepatosplenomegaly. There is no tenderness. There is no rebound, no guarding, no tenderness at McBurney's point and negative Murphy's sign. No hernia.  Musculoskeletal: Normal range of motion.  Neurological: He is alert and oriented to person, place, and time. He has normal strength. No  cranial nerve deficit or sensory deficit. Coordination normal. GCS eye subscore is 4. GCS verbal subscore is 5. GCS motor subscore is 6.  Skin: Skin is warm, dry and intact. No rash noted. No cyanosis.  Psychiatric: He has a normal mood and affect. His speech is normal and behavior is normal. Thought content normal.  Nursing note and vitals reviewed.   ED Course  Procedures (including critical  care time) Labs Review Labs Reviewed  CBC WITH DIFFERENTIAL - Abnormal; Notable for the following:    WBC 26.9 (*)    Hemoglobin 12.9 (*)    HCT 35.1 (*)    MCHC 36.8 (*)    RDW 15.6 (*)    Platelets 607 (*)    Neutrophils Relative % 87 (*)    Neutro Abs 23.4 (*)    Lymphocytes Relative 6 (*)    Monocytes Absolute 1.9 (*)    All other components within normal limits  COMPREHENSIVE METABOLIC PANEL - Abnormal; Notable for the following:    Sodium 105 (*)    Chloride 66 (*)    Glucose, Bld 228 (*)    BUN 33 (*)    Creatinine, Ser 2.34 (*)    Albumin 2.9 (*)    Alkaline Phosphatase 121 (*)    GFR calc non Af Amer 27 (*)    GFR calc Af Amer 31 (*)    Anion gap 20 (*)    All other components within normal limits  URINALYSIS, ROUTINE W REFLEX MICROSCOPIC - Abnormal; Notable for the following:    APPearance CLOUDY (*)    Specific Gravity, Urine <1.005 (*)    Hgb urine dipstick MODERATE (*)    Leukocytes, UA LARGE (*)    All other components within normal limits  BASIC METABOLIC PANEL - Abnormal; Notable for the following:    Sodium 105 (*)    Chloride 67 (*)    Glucose, Bld 226 (*)    BUN 33 (*)    Creatinine, Ser 2.33 (*)    GFR calc non Af Amer 27 (*)    GFR calc Af Amer 31 (*)    Anion gap 19 (*)    All other components within normal limits  URINE MICROSCOPIC-ADD ON - Abnormal; Notable for the following:    Bacteria, UA MANY (*)    All other components within normal limits  URINE CULTURE    Imaging Review No results found.   EKG Interpretation   Date/Time:  Monday March 24 2014 15:21:11 EST Ventricular Rate:  68 PR Interval:  123 QRS Duration: 101 QT Interval:  445 QTC Calculation: 473 R Axis:   -5 Text Interpretation:  Ectopic atrial rhythm Otherwise within normal limits  No previous tracing Confirmed by Maleke Feria  MD, Dejay Kronk ([id number]) on  03/24/2014 3:23:37 PM      MDM   Final diagnoses:  Hyponatremia  UTI (lower urinary tract infection)     Patient presents to the ER for evaluation of urinary symptoms. Patient reports that he has been experiencing urinary frequency and dysuria for about 7 days. He has history of recurring UTIs over the last several months. Reviewing the patient's records reveals that he has had a positive culture for Serratia on each of these occasions. Serratia has been sensitive to ceftriaxone as well as fluoroquinolones and Bactrim. Patient does have evidence of urinary tract infection today, was initiated on Rocephin by IV.  Patient to urinary tract infection, the patient has profound hyponatremia. This has not been seen previously, 2  months ago was the last time he had blood work and his sodium was 129. Today it was 105. This was confirmed with a repeat blood draw. Patient will require hospitalization for further management and evaluation of hyponatremia as well as treatment for UTI.    Orpah Greek, MD 03/24/14 1525

## 2014-03-24 NOTE — ED Notes (Signed)
CRITICAL VALUE ALERT  Critical value received:  Na 105  Date of notification:  03/24/14  Time of notification:  3762  Critical value read back:Yes.    Nurse who received alert:  Corene Cornea, RN  MD notified (1st page):  Betsey Holiday, MD  Time of first page:  1516  MD notified (2nd page):  Time of second page:  Responding MD:  Betsey Holiday  Time MD responded:  [phone]

## 2014-03-25 ENCOUNTER — Inpatient Hospital Stay (HOSPITAL_COMMUNITY): Payer: Medicare Other

## 2014-03-25 DIAGNOSIS — G9340 Encephalopathy, unspecified: Secondary | ICD-10-CM

## 2014-03-25 DIAGNOSIS — E871 Hypo-osmolality and hyponatremia: Secondary | ICD-10-CM | POA: Diagnosis not present

## 2014-03-25 LAB — BASIC METABOLIC PANEL
Anion gap: 10 (ref 5–15)
Anion gap: 10 (ref 5–15)
Anion gap: 11 (ref 5–15)
BUN: 32 mg/dL — AB (ref 6–23)
BUN: 31 mg/dL — ABNORMAL HIGH (ref 6–23)
BUN: 33 mg/dL — ABNORMAL HIGH (ref 6–23)
CALCIUM: 8.1 mg/dL — AB (ref 8.4–10.5)
CO2: 21 mmol/L (ref 19–32)
CO2: 18 mmol/L — ABNORMAL LOW (ref 19–32)
CO2: 22 mmol/L (ref 19–32)
CREATININE: 2.48 mg/dL — AB (ref 0.50–1.35)
Calcium: 8 mg/dL — ABNORMAL LOW (ref 8.4–10.5)
Calcium: 8.1 mg/dL — ABNORMAL LOW (ref 8.4–10.5)
Chloride: 84 mEq/L — ABNORMAL LOW (ref 96–112)
Chloride: 86 mEq/L — ABNORMAL LOW (ref 96–112)
Chloride: 85 mEq/L — ABNORMAL LOW (ref 96–112)
Creatinine, Ser: 2.27 mg/dL — ABNORMAL HIGH (ref 0.50–1.35)
Creatinine, Ser: 2.15 mg/dL — ABNORMAL HIGH (ref 0.50–1.35)
GFR calc Af Amer: 29 mL/min — ABNORMAL LOW (ref >90)
GFR calc non Af Amer: 25 mL/min — ABNORMAL LOW (ref >90)
GFR, EST AFRICAN AMERICAN: 32 mL/min — AB (ref >90)
GFR, EST AFRICAN AMERICAN: 34 mL/min — AB (ref >90)
GFR, EST NON AFRICAN AMERICAN: 28 mL/min — AB (ref >90)
GFR, EST NON AFRICAN AMERICAN: 30 mL/min — AB (ref >90)
GLUCOSE: 286 mg/dL — AB (ref 70–99)
Glucose, Bld: 203 mg/dL — ABNORMAL HIGH (ref 70–99)
Glucose, Bld: 236 mg/dL — ABNORMAL HIGH (ref 70–99)
Potassium: 4 mmol/L (ref 3.5–5.1)
Potassium: 4 mmol/L (ref 3.5–5.1)
Potassium: 4.1 mmol/L (ref 3.5–5.1)
Sodium: 115 mmol/L — CL (ref 135–145)
Sodium: 114 mmol/L — CL (ref 135–145)
Sodium: 118 mmol/L — CL (ref 135–145)

## 2014-03-25 LAB — COMPREHENSIVE METABOLIC PANEL
ALT: 27 U/L (ref 0–53)
AST: 25 U/L (ref 0–37)
Albumin: 2.4 g/dL — ABNORMAL LOW (ref 3.5–5.2)
Alkaline Phosphatase: 80 U/L (ref 39–117)
Anion gap: 11 (ref 5–15)
BUN: 32 mg/dL — ABNORMAL HIGH (ref 6–23)
CALCIUM: 8.1 mg/dL — AB (ref 8.4–10.5)
CO2: 21 mmol/L (ref 19–32)
CREATININE: 2.21 mg/dL — AB (ref 0.50–1.35)
Chloride: 83 mEq/L — ABNORMAL LOW (ref 96–112)
GFR calc Af Amer: 33 mL/min — ABNORMAL LOW (ref >90)
GFR, EST NON AFRICAN AMERICAN: 29 mL/min — AB (ref >90)
GLUCOSE: 201 mg/dL — AB (ref 70–99)
Potassium: 4 mmol/L (ref 3.5–5.1)
Sodium: 115 mmol/L — CL (ref 135–145)
Total Bilirubin: 0.8 mg/dL (ref 0.3–1.2)
Total Protein: 6.2 g/dL (ref 6.0–8.3)

## 2014-03-25 LAB — GLUCOSE, CAPILLARY
GLUCOSE-CAPILLARY: 226 mg/dL — AB (ref 70–99)
Glucose-Capillary: 346 mg/dL — ABNORMAL HIGH (ref 70–99)

## 2014-03-25 LAB — CBC
HCT: 32.9 % — ABNORMAL LOW (ref 39.0–52.0)
Hemoglobin: 11.8 g/dL — ABNORMAL LOW (ref 13.0–17.0)
MCH: 28.9 pg (ref 26.0–34.0)
MCHC: 35.9 g/dL (ref 30.0–36.0)
MCV: 80.6 fL (ref 78.0–100.0)
PLATELETS: 545 10*3/uL — AB (ref 150–400)
RBC: 4.08 MIL/uL — AB (ref 4.22–5.81)
RDW: 16 % — ABNORMAL HIGH (ref 11.5–15.5)
WBC: 24.8 10*3/uL — AB (ref 4.0–10.5)

## 2014-03-25 LAB — TSH
TSH: 5.341 u[IU]/mL — ABNORMAL HIGH (ref 0.350–4.500)
TSH: 4.872 u[IU]/mL — ABNORMAL HIGH (ref 0.350–4.500)

## 2014-03-25 LAB — RPR

## 2014-03-25 LAB — HEPATITIS C ANTIBODY: HCV Ab: NEGATIVE

## 2014-03-25 LAB — HIV ANTIBODY (ROUTINE TESTING W REFLEX): HIV: NONREACTIVE

## 2014-03-25 LAB — HEPATITIS B SURFACE ANTIGEN: Hepatitis B Surface Ag: NEGATIVE

## 2014-03-25 LAB — OSMOLALITY, URINE: Osmolality, Ur: 160 mOsm/kg — ABNORMAL LOW (ref 390–1090)

## 2014-03-25 MED ORDER — SODIUM CHLORIDE 0.45 % IV SOLN
INTRAVENOUS | Status: DC
  Administered 2014-03-25 – 2014-03-26 (×2): via INTRAVENOUS

## 2014-03-25 MED ORDER — INSULIN ASPART 100 UNIT/ML ~~LOC~~ SOLN
0.0000 [IU] | Freq: Three times a day (TID) | SUBCUTANEOUS | Status: DC
  Administered 2014-03-25: 7 [IU] via SUBCUTANEOUS
  Administered 2014-03-25: 3 [IU] via SUBCUTANEOUS
  Administered 2014-03-26 (×2): 5 [IU] via SUBCUTANEOUS
  Administered 2014-03-26 – 2014-03-27 (×3): 3 [IU] via SUBCUTANEOUS
  Administered 2014-03-27: 9 [IU] via SUBCUTANEOUS
  Administered 2014-03-28 (×2): 3 [IU] via SUBCUTANEOUS

## 2014-03-25 MED ORDER — SODIUM CHLORIDE 3 % IV SOLN
INTRAVENOUS | Status: DC
  Filled 2014-03-25: qty 500

## 2014-03-25 MED ORDER — SODIUM CHLORIDE 3 % IV SOLN
INTRAVENOUS | Status: AC
  Filled 2014-03-25: qty 500

## 2014-03-25 MED ORDER — HEPARIN SODIUM (PORCINE) 5000 UNIT/ML IJ SOLN
5000.0000 [IU] | Freq: Three times a day (TID) | INTRAMUSCULAR | Status: DC
Start: 2014-03-25 — End: 2014-03-28
  Administered 2014-03-25 – 2014-03-28 (×9): 5000 [IU] via SUBCUTANEOUS
  Filled 2014-03-25 (×10): qty 1

## 2014-03-25 NOTE — Progress Notes (Addendum)
INITIAL NUTRITION ASSESSMENT  DOCUMENTATION CODES Per approved criteria  -Not Applicable   INTERVENTION: Substitute Ensure Complete po BID at lunch and dinner instead of tea (due to fluid restriction), each supplement provides 350 kcal and 13 grams of protein  Encourage meal  intake to maximize nutrient provision  Recommend daily weights    NUTRITION DIAGNOSIS: Inadequate oral intake related to altered mental status, decreased appetite as evidenced by diet hx and 50% meal intake  Goal: Pt to meet >/= 90% of their estimated nutrition needs    Monitor:  Protein and energy intake, labs and weights  Reason for Assessment: Malnutrition Screen   67 y.o. male   ASSESSMENT: Pt has hx of recent UTI. He presents with altered mental status and hyponatremia. Family reports decreased oral intake prior to admission. He is able to feed himself and denies chewing or swallow difficulty. Pt has experienced weight loss over past 2 months but unable to verify actual percent change at this point. Recommend daily weight monitoring. He is at risk for malnutrition with recurrent UTI, metabolic dysregulation and decreased appetite.  Height: Ht Readings from Last 1 Encounters:  03/24/14 5\' 11"  (1.803 m)    Weight: Wt Readings from Last 1 Encounters:  03/24/14 150 lb (68.04 kg)  Re-weight obtained 168# (03/25/14)   Ideal Body Weight: 172#  % Ideal Body Weight: 98%  Wt Readings from Last 10 Encounters:  03/24/14 150 lb (68.04 kg)  02/06/14 170 lb (77.111 kg)  01/22/14 200 lb (90.719 kg)  12/30/13 190 lb (86.183 kg)    Usual Body Weight: 180# per pt  % Usual Body Weight: 93%   BMI:  Body mass index is 20.93 kg/(m^2).normal range  Estimated Nutritional Needs: Kcal: 1287-8676 Protein: 80-90 gr Fluid:1.9-2.3 liters daily   Skin: no issues noted  Diet Order: Diet Heart fluid restriction (1000 ml daily)  EDUCATION NEEDS: -No education needs identified at this time   Intake/Output  Summary (Last 24 hours) at 03/25/14 1228 Last data filed at 03/25/14 7209  Gross per 24 hour  Intake    360 ml  Output   7050 ml  Net  -6690 ml    Last BM: 12/18  Labs:   Recent Labs Lab 03/25/14 0537 03/25/14 0853 03/25/14 1105  NA 115* 115* 114*  K 4.0 4.0 4.0  CL 83* 84* 86*  CO2 21 21 18*  BUN 32* 32* 31*  CREATININE 2.21* 2.27* 2.15*  CALCIUM 8.1* 8.1* 8.0*  GLUCOSE 201* 203* 236*    CBG (last 3)   Recent Labs  03/25/14 1135  GLUCAP 226*    Scheduled Meds: . cefTRIAXone (ROCEPHIN)  IV  1 g Intravenous Q24H  . feeding supplement (ENSURE COMPLETE)  237 mL Oral BID BM  . heparin subcutaneous  5,000 Units Subcutaneous 3 times per day  . insulin aspart  0-9 Units Subcutaneous TID WC    Continuous Infusions: . sodium chloride 75 mL/hr at 03/25/14 4709    Past Medical History  Diagnosis Date  . Hypertension     Past Surgical History  Procedure Laterality Date  . Cervical spine surgery  2010  . Knee arthroscopy    . Carpal tunnel release    . Lumbar laminectomy      Colman Cater MS,RD,CSG,LDN Office: [phone] Pager: [phone]

## 2014-03-25 NOTE — Care Management Note (Addendum)
    Page 1 of 1   03/27/2014     12:30:52 PM CARE MANAGEMENT NOTE 03/27/2014  Patient:  Albert Garrison, Albert Garrison   Account Number:  [id number]  Date Initiated:  03/25/2014  Documentation initiated by:  Jolene Provost  Subjective/Objective Assessment:   Pt is from home, lives with son. Pt's aunt in room during visit. Pt admitted for abdominal pain. Pt has no HH services, DME's or medication needs prior to admission. Plan to discharge home. Will continue to follow for CM needs.     Action/Plan:   Anticipated DC Date:  03/27/2014   Anticipated DC Plan:  [place]  CM consult      Choice offered to / List presented to:             Status of service:  Completed, signed off Medicare Important Message given?  YES (If response is "NO", the following Medicare IM given date fields will be blank) Date Medicare IM given:  03/27/2014 Medicare IM given by:   Date Additional Medicare IM given:   Additional Medicare IM given by:    Discharge Disposition:  [facility]  Per UR Regulation:  Reviewed for med. necessity/level of care/duration of stay  If discussed at [place] of Stay Meetings, dates discussed:    Comments:  03/27/2014 [place], RN, MSN, PCCN Pt plans to discharge to [facility] in next 24-48 hours. CSW aware of discharge plan and iwll arrange for placement. No CM needs identified at this time.  03/25/2014 [place], RN, MSN, [employer]

## 2014-03-25 NOTE — Progress Notes (Signed)
Dr Darrick Meigs ordered to place pt on 1,000 ML Fluid Restriction. Will continue to monitor.

## 2014-03-25 NOTE — Progress Notes (Signed)
Albert Garrison  MRN: [id number]  DOB/AGE: December 08, 1945 68 y.o.  Primary Care Physician:WALKER Garrison, Albert Blade, MD  Admit date: 03/24/2014  Chief Complaint:  Chief Complaint  Patient presents with  . Abdominal Pain    S-Pt presented on  03/24/2014 with  Chief Complaint  Patient presents with  . Abdominal Pain  .    Pt today feels better   . cefTRIAXone (ROCEPHIN)  IV  1 g Intravenous Q24H  . enoxaparin (LOVENOX) injection  30 mg Subcutaneous Q24H  . feeding supplement (ENSURE COMPLETE)  237 mL Oral BID BM  . Influenza vac split quadrivalent PF  0.5 mL Intramuscular Tomorrow-1000  . pneumococcal 23 valent vaccine  0.5 mL Intramuscular Tomorrow-1000         UVO:ZDGUY from the symptoms mentioned above,there are no other symptoms referable to all systems reviewed.  Physical Exam: Vital signs in last 24 hours: Temp:  [97.8 F (36.6 C)-98.5 F (36.9 C)] 98.3 F (36.8 C) (12/22 0524) Pulse Rate:  [60-79] 79 (12/22 0524) Resp:  [16-18] 18 (12/22 0524) BP: (127-156)/(61-84) 128/61 mmHg (12/22 0524) SpO2:  [98 %-100 %] 100 % (12/22 0524) Weight:  [150 lb (68.04 kg)] 150 lb (68.04 kg) (12/21 1155) Weight change:  Last BM Date: 03/21/14  Intake/Output from previous day: 12/21 0701 - 12/22 0700 In: 120 [P.O.:120] Out: 6150 [Urine:6150]     Physical Exam: General- pt is awake,alert, oriented to time place and person Resp- No acute REsp distress, CTA B/L NO Rhonchi CVS- S1S2 regular ij rate and rhythm GIT- BS+, soft, NT, ND EXT- NO LE Edema, Cyanosis   Lab Results: CBC  Recent Labs  03/24/14 1301 03/25/14 0537  WBC 26.9* 24.8*  HGB 12.9* 11.8*  HCT 35.1* 32.9*  PLT 607* 545*    BMET  Recent Labs  03/24/14 2105 03/25/14 0537  NA 106* 115*  K 4.5 4.0  CL 70* 83*  CO2 17* 21  GLUCOSE 260* 201*  BUN 33* 32*  CREATININE 2.22* 2.21*  CALCIUM 8.2* 8.1*    Trend Creat 2015 1.57=>2.34=>2.22  Trend Sodium 2015 129=>105=>115  Anion  Gap 115-104=11  Delta AG 11-9=2 Delta Bicarb 24-21=3   MICRO No results found for this or any previous visit (from the past 240 hour(s)).    Lab Results  Component Value Date   CALCIUM 8.1* 03/25/2014         Impression: 1)Renal  AKI secondary to Post renal/ATN               AKI sec to  Post renal                                  NSAIDS                                  SIRS ( high WBC)               AKI on CKD                CKD stage 3 .               CKD since not sure                CKD secondary to Post renal ( hx of multiple UTI)  Progression of CKD marked with KI                Proteinura will check.  2)HTN BP stable   3)Anemia HGb at goal (9--11)   4)CKD Mineral-Bone Disorder PTH not avail. Secondary Hyperparathyroidism w/u pending . Phosphorus & vitamin 25-OH will check.  5)ID- admitted with UTI ? pylelo Primary MD following  6)Hyponatremia Multifactirial        Post renal obstruction        Hyperglycemia        AKI         NSAIDS    7)Acid base Co2 at goal     Plan:  Will ask for renal u/s Will ask for TSH Will ask for am cortisol Will ask for serum and urine osmolarity in am again      Albert Garrison S 03/25/2014, 8:25 AM

## 2014-03-25 NOTE — Plan of Care (Signed)
Problem: Phase I Progression Outcomes Goal: Voiding-avoid urinary catheter unless indicated Outcome: Not Progressing Patient had urinary retention

## 2014-03-25 NOTE — Progress Notes (Signed)
PROGRESS NOTE  ZOE NORDIN BWG:[id number] DOB: 05-16-1945 DOA: 03/24/2014 PCP: Madelyn Brunner, MD  Summary: [phone] presented with urinary retention, N/v/constipation, recurrent UTI, Confused. He was found to have profound hyponatremia with associated acute encephalopathy, recurrent UTI and suspected acute renal failure with anion gap metabolic acidosis.  Assessment/Plan: 1. Profound hyponatremia (hypovolemic?) with mild acute encephalopathy/confusion. Management per nephrology. Improving. 2. Acute encephalopathy appears nearly resolved. Very minimal in nature, tolerating consider hyponatremia very well. 3. UTI, recurrent, WBC somewhat improved. Hemodynamics stable and afebrile. Renal ultrasound unremarkable. TSH normal. 4. Suspected acute renal failure, non-oliguric. No sig change. Suspect a component of CKD.  5. AG metabolic acidosis has cleared, secondary to uremia. 6. Acute urinary retention. Foley placed 12/21 7. Ataxia. Plan physical therapy consult.  8. DM poor control. 50. Divorce recent, foreclosure on house, depression. Social work consult.   Management of hyponatremia per nephrology  F/u urine culture, continue abx  F/u cortisol  SSI, check A1c, change diet to carb modified  Consult PT   Recommend outpatient urology follow-up  Consult social work  Aunt wants rehab  Needs to establish HCPOA, ex-wife   Code Status: full code DVT prophylaxis: Lovenox  Family Communication: Discussed with niece at bedside. Disposition Plan: Anticipate home  Murray Hodgkins, MD  [facility]  Pager [phone] If 7PM-7AM, please contact night-coverage at [email or web address], password [facility] 03/25/2014, 10:39 AM  LOS: 1 day   Consultants:  Nephrology   Procedures:    Antibiotics:  Ceftriaxone 12/21 >>  HPI/Subjective: Feeling better, still some bladder discomfort. No n/v today. Tolerated breakfast. Slept ok. Aunt at bedside reports social issues at  home.  Objective: Filed Vitals:   03/24/14 1530 03/24/14 1700 03/24/14 2153 03/25/14 0524  BP: 148/76 156/73 139/65 128/61  Pulse: 73 67 76 79  Temp:  97.9 F (36.6 C) 98.5 F (36.9 C) 98.3 F (36.8 C)  TempSrc:  Oral Oral Oral  Resp: 16 18 18 18   Height:      Weight:      SpO2: 98% 98% 100% 100%    Intake/Output Summary (Last 24 hours) at 03/25/14 1039 Last data filed at 03/25/14 0939  Gross per 24 hour  Intake    360 ml  Output   7050 ml  Net  -6690 ml     Filed Weights   03/24/14 1155  Weight: 68.04 kg (150 lb)    Exam:     Afebrile, VSS, no hypoxia  General: Appears calm, comfortable.  Psych: Alert. Speech fluent and clear. He is oriented to self, location, month, president but not year.  CV: Regular rate and rhythm. No murmur, rub or gallop. No lower extremity edema.  Respiratory: Clear to auscultation bilaterally. No wheezes, rales or rhonchi. Normal respiratory effort.  Musculoskeletal: Moves all extremities to command.  Neuro: Grossly nonfocal  Data Reviewed:  UOP 6195 L  Na+ up to 115  Cl- up to 84  Creatinine w/o sig change, 2.27, remains above baseline  Pertinent data:  12/21 CXR no acute disease  12/21 Na+ 105, Cl 67, Cr 2.33, WBC 26  12/21 U/A TNTC WBC  Pending data:  UC  Scheduled Meds: . cefTRIAXone (ROCEPHIN)  IV  1 g Intravenous Q24H  . enoxaparin (LOVENOX) injection  30 mg Subcutaneous Q24H  . feeding supplement (ENSURE COMPLETE)  237 mL Oral BID BM  . Influenza vac split quadrivalent PF  0.5 mL Intramuscular Tomorrow-1000  . insulin aspart  0-9 Units Subcutaneous TID WC  . pneumococcal 23  valent vaccine  0.5 mL Intramuscular Tomorrow-1000   Continuous Infusions: . sodium chloride 75 mL/hr at 03/25/14 7943    Active Problems:   UTI (lower urinary tract infection)   Hyponatremia   Altered mental status   Time spent 25 minutes

## 2014-03-25 NOTE — Clinical Social Work Psychosocial (Signed)
Clinical Social Work Department BRIEF PSYCHOSOCIAL ASSESSMENT 03/25/2014  Patient:  Albert Garrison,Albert Garrison     Account Number:  [id number]     Admit date:  03/24/2014  Clinical Social Worker:  ,, LCSW  Date/Time:  03/25/2014 02:57 PM  Referred by:  Physician  Date Referred:  03/25/2014 Referred for  Other - See comment  SNF Placement   Other Referral:   recent divorce, house in foreclosure   Interview type:  Patient Other interview type:   aunt- Betty    PSYCHOSOCIAL DATA Living Status:  WITH ADULT CHILDREN Admitted from facility:   Level of care:   Primary support name:  Betty Primary support relationship to patient:  FAMILY Degree of support available:   supportive per pt    CURRENT CONCERNS Current Concerns  Post-Acute Placement   Other Concerns:    SOCIAL WORK ASSESSMENT / PLAN CSW met with pt at bedside following referral from MD and PT. Pt alert and oriented x4, but had difficulty staying on topic and quickly forgot questions asked. Pt states he recently got divorced in February. He said this has been "good and bad" and he has felt somewhat depressed regarding situation. CSW provided support. His son, Curtis moved in with him. Pt states that he works during the day and pt is alone most of the time. At baseline, he is independent. Pt's aunt, Betty is his best support and is trying to help him out. CSW asked about pt's mental health history. He reports he was diagnosed with Bipolar disorder in 1970s. He has not been on any medications and has not seen a psychiatrist. Pt does not feel that this is necessary. PT evaluated pt today and recommendation is for SNF due to poor balance. CSW discussed this with pt and he states, "It can't hurt." When asked where he would be interested in going, pt started talking about his garage and agreed for CSW to speak to Betty about it.    Betty provided additional information regarding situations above. She states that pt's son does not  contribute and is not at home much. Pt has a hearing on January 12 regarding foreclosure. She describes his home as dirty and reports that pt does not follow through on things. He had a UTI in September and was supposed to follow up with urology but never did. Pt came to ED yesterday due to urinary retention. He was found to have significant hyponatremia. Betty appeared relieved that recommendation was for SNF and that pt agreed. She said that pt does not have a POA. She plans to discuss with his siblings as she would prefer for them to provide additional help to pt. A Medicaid application was just completed. Betty requests placement in [place] if possible. She is aware of Medicare coverage/criteria. CSW left SNF list in room.   Assessment/plan status:  Psychosocial Support/Ongoing Assessment of Needs Other assessment/ plan:   Information/referral to community resources:   SNF list    PATIENT'S/FAMILY'S RESPONSE TO PLAN OF CARE: Pt unable to complete meaningful assessment, but is willing to go to SNF at d/c. CSW will initiate bed search and follow up.        , LCSW [phone] 

## 2014-03-25 NOTE — Progress Notes (Signed)
Inpatient Diabetes Program Recommendations  AACE/ADA: New Consensus Statement on Inpatient Glycemic Control (2013)  Target Ranges:  Prepandial:   less than 140 mg/dL      Peak postprandial:   less than 180 mg/dL (1-2 hours)      Critically ill patients:  140 - 180 mg/dL   Results for Albert Garrison, Albert Garrison (MRN [id number]) as of 03/25/2014 08:37  Ref. Range 03/24/2014 14:20 03/24/2014 17:58 03/24/2014 19:01 03/24/2014 21:05 03/25/2014 05:37  Glucose Latest Range: 70-99 mg/dL 226 (H) 235 (H) 288 (H) 260 (H) 201 (H)   Diabetes history: No Outpatient Diabetes medications: NA Current orders for Inpatient glycemic control: None  Inpatient Diabetes Program Recommendations Correction (SSI): Please consider ordering CBGs with Novolog correction scale. HgbA1C: Please consider ordering an A1C to evaluate glycemic control over the past 2-3 months. Diet: May want to consider adding Carb Modified to Heart Healthy diet.  Thanks, Barnie Alderman, RN, MSN, CCRN, CDE Diabetes Coordinator Inpatient Diabetes Program [phone] (Team Pager) [phone] ([facility] office) [phone] [facility] office)

## 2014-03-25 NOTE — Clinical Social Work Placement (Signed)
Clinical Social Work Department CLINICAL SOCIAL WORK PLACEMENT NOTE 03/25/2014  Patient:  Albert Garrison, Albert Garrison  Account Number:  [id number] Admit date:  03/24/2014  Clinical Social Worker:  Benay Pike, LCSW  Date/time:  03/25/2014 02:54 PM  Clinical Social Work is seeking post-discharge placement for this patient at the following level of care:   [place]   (*CSW will update this form in Epic as items are completed)   03/25/2014  Patient/family provided with [place] Department of Clinical Social Work's list of facilities offering this level of care within the geographic area requested by the patient (or if unable, by the patient's family).  03/25/2014  Patient/family informed of their freedom to choose among providers that offer the needed level of care, that participate in Medicare, Medicaid or managed care program needed by the patient, have an available bed and are willing to accept the patient.  03/25/2014  Patient/family informed of [facility]' ownership interest in [facility], as well as of the fact that they are under no obligation to receive care at this facility.  PASARR submitted to EDS on 03/25/2014 PASARR number received on 03/25/2014  FL2 transmitted to all facilities in geographic area requested by pt/family on  03/25/2014 FL2 transmitted to all facilities within larger geographic area on   Patient informed that his/her managed care company has contracts with or will negotiate with  certain facilities, including the following:     Patient/family informed of bed offers received:   Patient chooses bed at  Physician recommends and patient chooses bed at    Patient to be transferred to  on   Patient to be transferred to facility by  Patient and family notified of transfer on  Name of family member notified:    The following physician request were entered in Epic:   Additional Comments:  Benay Pike, [place]

## 2014-03-25 NOTE — Progress Notes (Signed)
Critical Serum Osmolality = 236 Notified Dr Darrick Meigs.  Pt is laying in bed, asleep at this time. Will continue to monitor pt throughout night and will wait to hear from dr. Bed is in lowest position & call bell is within reach.

## 2014-03-25 NOTE — Evaluation (Signed)
Physical Therapy Evaluation Patient Details Name: Albert Garrison MRN: [id number] DOB: [birth year] Today's Date: 03/25/2014   History of Present Illness  This is a 68 year old man who has been complaining of urinary increased frequency and dysuria for the last week or so. He has had poor by mouth intake. He has documented UTI previously. He has had chills, nausea and also vomiting. He has had some nonspecific abdominal discomfort. There is no diarrhea, rectal bleeding. Evaluation in the emergency room shows him to have urinalysis consistent with UTI and significant hyponatremia with a sodium of only 105. He is not on any diuretics. He is now being admitted for further management.  Clinical Impression   Pt was seen  For evaluation.  He was alert and oriented by cognitively very slow and had difficulty following some directions.  His aunt reports that he has been confused this week and has been unstable walking.  On manual muscle test his strength is WNL but he was unable to perform heel/shin test.  His balance was found to be significantly deficient especially in standing and he now requires a walker for gait.  Even is sitting, I had him don his socks and he fell over onto the bed while trying to achieve this.  It is possible that this instability is related to his medical problems, but for now I am recommending SNF at d/c with a focus on balance training.    Follow Up Recommendations SNF    Equipment Recommendations  Rolling walker with 5" wheels    Recommendations for Other Services       Precautions / Restrictions Precautions Precautions: Fall Restrictions Weight Bearing Restrictions: No      Mobility  Bed Mobility Overal bed mobility: Modified Independent             General bed mobility comments: transfer to EOB is somewhat slow and labored  Transfers Overall transfer level: Needs assistance Equipment used: None Transfers: Sit to/from Stand Sit to Stand: Min guard          General transfer comment: pt able to stand without assist but standing balance is poor and he need stabilization to maintain standing...falls backward  Ambulation/Gait Ambulation/Gait assistance: Min guard Ambulation Distance (Feet): 60 Feet Assistive device: Rolling walker (2 wheeled) Gait Pattern/deviations: Decreased dorsiflexion - left;Decreased dorsiflexion - right;Shuffle   Gait velocity interpretation: Below normal speed for age/gender General Gait Details: pt requires a walker for gait stability and he was instructed in its' use  Stairs            Wheelchair Mobility    Modified Rankin (Stroke Patients Only)       Balance Overall balance assessment: Needs assistance Sitting-balance support: No upper extremity supported;Feet supported Sitting balance-Leahy Scale: Fair Sitting balance - Comments: it took pt some time to be able to sit upright without falling backward...once this was estabilished he was able to hold sitting without being challenged   Standing balance support: No upper extremity supported Standing balance-Leahy Scale: Fair Standing balance comment: once pt establishes his standing balance he is able to maintain balance without falling backward as long as he is not moving any body part                             Pertinent Vitals/Pain Pain Assessment: No/denies pain    Home Living Family/patient expects to be discharged to:: Skilled nursing facility  Additional Comments: son lives with pt but does not assist pt per report of a family member    Prior Function Level of Independence: Independent               Hand Dominance        Extremity/Trunk Assessment               Lower Extremity Assessment: Overall WFL for tasks assessed         Communication   Communication: No difficulties  Cognition Arousal/Alertness: Awake/alert Behavior During Therapy: WFL for tasks  assessed/performed Overall Cognitive Status: Impaired/Different from baseline (cognition is very slow from baseline)                      General Comments      Exercises        Assessment/Plan    PT Assessment Patient needs continued PT services  PT Diagnosis     PT Problem List Decreased activity tolerance;Decreased balance;Decreased mobility  PT Treatment Interventions Gait training;Balance training   PT Goals (Current goals can be found in the Care Plan section) Acute Rehab PT Goals Patient Stated Goal: none stated Time For Goal Achievement: 04/08/14 Potential to Achieve Goals: Fair    Frequency Min 2X/week   Barriers to discharge Decreased caregiver support son not usually available to assist    Co-evaluation               End of Session Equipment Utilized During Treatment: Gait belt Activity Tolerance: Patient tolerated treatment well Patient left: in bed;with call bell/phone within reach;with bed alarm set Nurse Communication: Mobility status         Time: 1320-1346 PT Time Calculation (min) (ACUTE ONLY): 26 min   Charges:   PT Evaluation $Initial PT Evaluation Tier I: 1 Procedure     PT G CodesDemetrios Isaacs L 03/25/2014, 2:15 PM

## 2014-03-26 DIAGNOSIS — IMO0001 Reserved for inherently not codable concepts without codable children: Secondary | ICD-10-CM

## 2014-03-26 DIAGNOSIS — Z794 Long term (current) use of insulin: Secondary | ICD-10-CM

## 2014-03-26 DIAGNOSIS — N179 Acute kidney failure, unspecified: Secondary | ICD-10-CM

## 2014-03-26 DIAGNOSIS — E119 Type 2 diabetes mellitus without complications: Secondary | ICD-10-CM

## 2014-03-26 LAB — C3 COMPLEMENT: C3 Complement: 125 mg/dL (ref 90–180)

## 2014-03-26 LAB — GLUCOSE, CAPILLARY
GLUCOSE-CAPILLARY: 221 mg/dL — AB (ref 70–99)
GLUCOSE-CAPILLARY: 269 mg/dL — AB (ref 70–99)
Glucose-Capillary: 286 mg/dL — ABNORMAL HIGH (ref 70–99)
Glucose-Capillary: 269 mg/dL — ABNORMAL HIGH (ref 70–99)
Glucose-Capillary: 253 mg/dL — ABNORMAL HIGH (ref 70–99)

## 2014-03-26 LAB — BASIC METABOLIC PANEL
Anion gap: 9 (ref 5–15)
BUN: 31 mg/dL — ABNORMAL HIGH (ref 6–23)
CO2: 22 mmol/L (ref 19–32)
Calcium: 8.1 mg/dL — ABNORMAL LOW (ref 8.4–10.5)
Chloride: 90 mEq/L — ABNORMAL LOW (ref 96–112)
Creatinine, Ser: 2.13 mg/dL — ABNORMAL HIGH (ref 0.50–1.35)
GFR calc Af Amer: 35 mL/min — ABNORMAL LOW (ref >90)
GFR calc non Af Amer: 30 mL/min — ABNORMAL LOW (ref >90)
GLUCOSE: 236 mg/dL — AB (ref 70–99)
POTASSIUM: 3.8 mmol/L (ref 3.5–5.1)
SODIUM: 121 mmol/L — AB (ref 135–145)

## 2014-03-26 LAB — CBC
HEMATOCRIT: 31.9 % — AB (ref 39.0–52.0)
Hemoglobin: 11.1 g/dL — ABNORMAL LOW (ref 13.0–17.0)
MCH: 28.8 pg (ref 26.0–34.0)
MCHC: 34.8 g/dL (ref 30.0–36.0)
MCV: 82.6 fL (ref 78.0–100.0)
Platelets: 602 10*3/uL — ABNORMAL HIGH (ref 150–400)
RBC: 3.86 MIL/uL — ABNORMAL LOW (ref 4.22–5.81)
RDW: 16.7 % — ABNORMAL HIGH (ref 11.5–15.5)
WBC: 16.8 10*3/uL — AB (ref 4.0–10.5)

## 2014-03-26 LAB — PHOSPHORUS: Phosphorus: 3.1 mg/dL (ref 2.3–4.6)

## 2014-03-26 LAB — OSMOLALITY: Osmolality: 264 mOsm/kg — ABNORMAL LOW (ref 275–300)

## 2014-03-26 LAB — HEMOGLOBIN A1C
HEMOGLOBIN A1C: 7.6 % — AB (ref <5.7)
MEAN PLASMA GLUCOSE: 171 mg/dL — AB (ref <117)

## 2014-03-26 LAB — OSMOLALITY, URINE: Osmolality, Ur: 190 mOsm/kg — ABNORMAL LOW (ref 390–1090)

## 2014-03-26 LAB — C4 COMPLEMENT: COMPLEMENT C4, BODY FLUID: 21 mg/dL (ref 10–40)

## 2014-03-26 LAB — ANA: Anti Nuclear Antibody(ANA): NEGATIVE

## 2014-03-26 LAB — CORTISOL-AM, BLOOD: Cortisol - AM: 31.1 ug/dL — ABNORMAL HIGH (ref 4.3–22.4)

## 2014-03-26 LAB — VITAMIN D 25 HYDROXY (VIT D DEFICIENCY, FRACTURES): Vit D, 25-Hydroxy: 23 ng/mL — ABNORMAL LOW (ref 30–100)

## 2014-03-26 MED ORDER — INSULIN DETEMIR 100 UNIT/ML ~~LOC~~ SOLN
5.0000 [IU] | Freq: Every day | SUBCUTANEOUS | Status: DC
  Administered 2014-03-26 – 2014-03-27 (×2): 5 [IU] via SUBCUTANEOUS
  Filled 2014-03-26 (×5): qty 0.05

## 2014-03-26 NOTE — Progress Notes (Signed)
Subjective: Interval History: has no complaint of nausea or vomiting. Patient denies any difficulty breathing. His feeling much better..  Objective: Vital signs in last 24 hours: Temp:  [97.4 F (36.3 C)-99.7 F (37.6 C)] 98.9 F (37.2 C) (12/23 0614) Pulse Rate:  [73-83] 75 (12/23 0614) Resp:  [20] 20 (12/23 0614) BP: (127-146)/(60-69) 134/69 mmHg (12/23 0614) SpO2:  [97 %-98 %] 97 % (12/23 0614) Weight change:   Intake/Output from previous day: 12/22 0701 - 12/23 0700 In: 2150 [P.O.:480; I.V.:1670] Out: 4700 [Urine:4700] Intake/Output this shift:    General appearance: alert, cooperative and no distress Resp: clear to auscultation bilaterally Cardio: regular rate and rhythm, S1, S2 normal, no murmur, click, rub or gallop GI: soft, non-tender; bowel sounds normal; no masses,  no organomegaly Extremities: extremities normal, atraumatic, no cyanosis or edema  Lab Results:  Recent Labs  03/25/14 0537 03/26/14 0556  WBC 24.8* 16.8*  HGB 11.8* 11.1*  HCT 32.9* 31.9*  PLT 545* 602*   BMET:  Recent Labs  03/25/14 1839 03/26/14 0556  NA 118* 121*  K 4.1 3.8  CL 85* 90*  CO2 22 22  GLUCOSE 286* 236*  BUN 33* 31*  CREATININE 2.48* 2.13*  CALCIUM 8.1* 8.1*   No results for input(s): PTH in the last 72 hours. Iron Studies: No results for input(s): IRON, TIBC, TRANSFERRIN, FERRITIN in the last 72 hours.  Studies/Results: Dg Chest 2 View  03/24/2014   CLINICAL DATA:  Hyponatremia, hypertension  EXAM: CHEST  2 VIEW  COMPARISON:  05/08/2008  FINDINGS: Cardiomediastinal silhouette is stable. No acute infiltrate or pleural effusion. No pulmonary edema. Mild elevation of the right hemidiaphragm. Degenerative changes mid and lower thoracic spine. Metallic fixation plate partially visualized cervical spine.  IMPRESSION: No active cardiopulmonary disease.   Electronically Signed   By: Lahoma Crocker M.D.   On: 03/24/2014 16:38   US Renal  03/25/2014   CLINICAL DATA:   Hypertension, acute tubular necrosis  EXAM: RENAL/URINARY TRACT ULTRASOUND COMPLETE  COMPARISON:  None.  FINDINGS: Right Kidney:  Length: 11.1 cm. Echogenicity within normal limits. No mass or hydronephrosis visualized.  Left Kidney:  Length: 13.3 cm. Echogenicity within normal limits. No mass or hydronephrosis visualized.  Bladder:  Decompressed by Foley catheter.  IMPRESSION: No acute abnormality noted.   Electronically Signed   By: [name] M.D.   On: 03/25/2014 13:09    I have reviewed the patient's current medications.  Assessment/Plan: Problem #1 hyponatremia: Thought to be secondary to hypovolemic hyponatremia. Presently sodium is 121 and progressively improving. Problem #2 acute kidney injury: The etiology for his renal failure was thought to be secondary to obstructive uropathy/Bactrim/NSAID/. His renal function seems to be improving progressively. Patient presently is non-oliguric. Problem #3 metabolic acidosis: Has corrected Problem #4 history of postoperative uropathy: Patient with Foley catheter Problem #5 chronic renal failure: Stage III. Possibly comminution of diabetes/NSAID/secondary to recurrent UTI with obstructive uropathy. Problem #6 altered mental status: Patient is alert and at this time doesn't show any significant difference. Problem #7 recurrent UTI: Patient is afebrile and his white blood cell count is improving. Problem #8 anemia: Mild. Plan: We'll continue his present treatment. We'll check his basic metabolic panel in the morning.   LOS: 2 days   Adorian Gwynne S 03/26/2014,10:54 AM   Pain

## 2014-03-26 NOTE — Clinical Social Work Placement (Signed)
Clinical Social Work Department CLINICAL SOCIAL WORK PLACEMENT NOTE 03/26/2014  Patient:  Albert Garrison, Albert Garrison  Account Number:  [id number] Admit date:  03/24/2014  Clinical Social Worker:  Benay Pike, LCSW  Date/time:  03/25/2014 02:54 PM  Clinical Social Work is seeking post-discharge placement for this patient at the following level of care:   [place]   (*CSW will update this form in Epic as items are completed)   03/25/2014  Patient/family provided with [place] Department of Clinical Social Work's list of facilities offering this level of care within the geographic area requested by the patient (or if unable, by the patient's family).  03/25/2014  Patient/family informed of their freedom to choose among providers that offer the needed level of care, that participate in Medicare, Medicaid or managed care program needed by the patient, have an available bed and are willing to accept the patient.  03/25/2014  Patient/family informed of [facility]' ownership interest in [facility], as well as of the fact that they are under no obligation to receive care at this facility.  PASARR submitted to EDS on 03/25/2014 PASARR number received on 03/25/2014  FL2 transmitted to all facilities in geographic area requested by pt/family on  03/25/2014 FL2 transmitted to all facilities within larger geographic area on   Patient informed that his/her managed care company has contracts with or will negotiate with  certain facilities, including the following:     Patient/family informed of bed offers received:  03/26/2014 Patient chooses bed at [facility] Physician recommends and patient chooses bed at  [facility]  Patient to be transferred to [facility] on   Patient to be transferred to facility by  Patient and family notified of transfer on  Name of family member notified:    The following physician request were entered in Epic:   Additional  Comments:  Benay Pike, [place]

## 2014-03-26 NOTE — Progress Notes (Signed)
Inpatient Diabetes Program Recommendations  AACE/ADA: New Consensus Statement on Inpatient Glycemic Control (2013)  Target Ranges:  Prepandial:   less than 140 mg/dL      Peak postprandial:   less than 180 mg/dL (1-2 hours)      Critically ill patients:  140 - 180 mg/dL   Results for ALLAN, MINOTTI (MRN [id number]) as of 03/26/2014 08:30  Ref. Range 03/25/2014 11:35 03/25/2014 16:23 03/25/2014 22:09 03/26/2014 07:45  Glucose-Capillary Latest Range: 70-99 mg/dL 226 (H) 346 (H) 286 (H) 221 (H)   Diabetes history: No Outpatient Diabetes medications: NA Current orders for Inpatient glycemic control: Novolog 0-9 units TID with meals  Inpatient Diabetes Program Recommendations Insulin - Basal: Please consider ordering low dose basal insulin; recommend starting with Levemir 7 units daily (based on 68 kg x 0.1 units). Correction (SSI): Please conisder increasing Novolog correction to moderate sclae and adding Novolog bedtime correction. HgbA1C: A1C has been ordered and is in process.  Thanks, Barnie Alderman, RN, MSN, CCRN, CDE Diabetes Coordinator Inpatient Diabetes Program [phone] (Team Pager) [phone] ([facility] office) [phone] [facility] office)

## 2014-03-26 NOTE — Clinical Social Work Note (Signed)
CSW attempted to meet again with pt this morning. He was sleeping soundly. CSW spoke with pt's aunt, [name] who accepts bed at [facility]. Facility notified. [name] reports that pt's siblings may be willing to become HCPOA. CSW left advance directive packet in pt's room for them to review. She is aware that pt would need to be completely oriented when completing. [name] plans to complete admission paperwork today at [facility] in anticipation of d/c soon.   Benay Pike, [place]

## 2014-03-26 NOTE — Progress Notes (Signed)
[facility] PROGRESS NOTE  Albert Garrison DDU:[id number] DOB: 1945/09/14 DOA: 03/24/2014 PCP: Madelyn Brunner, MD  Assessment/Plan: Profound hyponatremia -Thought to be secondary to hypovolemia. -Presently sodium is 121 and progressively improving from 106 on admission. -Appreciate nephrology assistance.  Acute encephalopathy -Improving, believe secondary to hyponatremia.  Recurrent UTI -Urine culture with greater than 100,000 colonies of gram-negative rods. -Continue Rocephin pending final culture data.  Diabetes -Uncontrolled.  -Start basal insulin.  Acute renal failure -Thought secondary to obstructive uropathy, Bactrim, NSAIDs, hypovolemia. -Continue Foley catheter, creatinine seems to be stable around 2.1-2.5.  Code Status: Full code Family Communication: Patient only  Disposition Plan: To skilled nursing facility when medically ready   Consultants:  Nephrology   Antibiotics:  Rocephin   Subjective: No complaints  Objective: Filed Vitals:   03/25/14 1528 03/25/14 2131 03/26/14 0614 03/26/14 1419  BP: 146/64 127/60 134/69 153/71  Pulse: 73 83 75 63  Temp: 97.4 F (36.3 C) 99.7 F (37.6 C) 98.9 F (37.2 C) 98.7 F (37.1 C)  TempSrc: Oral Oral Oral Oral  Resp: 20 20 20 20   Height:      Weight:      SpO2: 98% 97% 97% 97%    Intake/Output Summary (Last 24 hours) at 03/26/14 1514 Last data filed at 03/26/14 1200  Gross per 24 hour  Intake   2150 ml  Output   5200 ml  Net  -3050 ml   Filed Weights   03/24/14 1155  Weight: 68.04 kg (150 lb)    Exam:   General:  Awake, oriented  Cardiovascular: Regular rate and rhythm  Respiratory: Clear to auscultation bilaterally  Abdomen: Soft, nontender, nondistended, positive bowel sounds  Extremities: Trace bilateral pitting edema   Neurologic:  Nonfocal  Data Reviewed: Basic Metabolic Panel:  Recent Labs Lab 03/25/14 0537 03/25/14 0853 03/25/14 1105 03/25/14 1839  03/26/14 0556  NA 115* 115* 114* 118* 121*  K 4.0 4.0 4.0 4.1 3.8  CL 83* 84* 86* 85* 90*  CO2 21 21 18* 22 22  GLUCOSE 201* 203* 236* 286* 236*  BUN 32* 32* 31* 33* 31*  CREATININE 2.21* 2.27* 2.15* 2.48* 2.13*  CALCIUM 8.1* 8.1* 8.0* 8.1* 8.1*  PHOS  --   --   --   --  3.1   Liver Function Tests:  Recent Labs Lab 03/24/14 1301 03/25/14 0537  AST 34 25  ALT 35 27  ALKPHOS 121* 80  BILITOT 0.8 0.8  PROT 7.5 6.2  ALBUMIN 2.9* 2.4*   No results for input(s): LIPASE, AMYLASE in the last 168 hours. No results for input(s): AMMONIA in the last 168 hours. CBC:  Recent Labs Lab 03/24/14 1301 03/25/14 0537 03/26/14 0556  WBC 26.9* 24.8* 16.8*  NEUTROABS 23.4*  --   --   HGB 12.9* 11.8* 11.1*  HCT 35.1* 32.9* 31.9*  MCV 80.0 80.6 82.6  PLT 607* 545* 602*   Cardiac Enzymes: No results for input(s): CKTOTAL, CKMB, CKMBINDEX, TROPONINI in the last 168 hours. BNP (last 3 results) No results for input(s): PROBNP in the last 8760 hours. CBG:  Recent Labs Lab 03/25/14 1135 03/25/14 1623 03/25/14 2209 03/26/14 0745 03/26/14 1135  GLUCAP 226* 346* 286* 221* 269*    Recent Results (from the past 240 hour(s))  Urine culture     Status: None (Preliminary result)   Collection Time: 03/24/14  1:20 PM  Result Value Ref Range Status   Specimen Description URINE, SUPRAPUBIC  Final   Special Requests  NONE  Final   Culture  Setup Time   Final    03/25/2014 04:24 Performed at [place]   Final    >=100,000 COLONIES/ML Performed at [place] Performed at [facility]    Report Status PENDING  Incomplete     Studies: Dg Chest 2 View  03/24/2014   CLINICAL DATA:  Hyponatremia, hypertension  EXAM: CHEST  2 VIEW  COMPARISON:  05/08/2008  FINDINGS: Cardiomediastinal silhouette is stable. No acute infiltrate or pleural effusion. No pulmonary edema. Mild elevation of the right  hemidiaphragm. Degenerative changes mid and lower thoracic spine. Metallic fixation plate partially visualized cervical spine.  IMPRESSION: No active cardiopulmonary disease.   Electronically Signed   By: Lahoma Crocker M.D.   On: 03/24/2014 16:38   US Renal  03/25/2014   CLINICAL DATA:  Hypertension, acute tubular necrosis  EXAM: RENAL/URINARY TRACT ULTRASOUND COMPLETE  COMPARISON:  None.  FINDINGS: Right Kidney:  Length: 11.1 cm. Echogenicity within normal limits. No mass or hydronephrosis visualized.  Left Kidney:  Length: 13.3 cm. Echogenicity within normal limits. No mass or hydronephrosis visualized.  Bladder:  Decompressed by Foley catheter.  IMPRESSION: No acute abnormality noted.   Electronically Signed   By: [name] M.D.   On: 03/25/2014 13:09    Scheduled Meds: . cefTRIAXone (ROCEPHIN)  IV  1 g Intravenous Q24H  . feeding supplement (ENSURE COMPLETE)  237 mL Oral BID BM  . heparin subcutaneous  5,000 Units Subcutaneous 3 times per day  . insulin aspart  0-9 Units Subcutaneous TID WC   Continuous Infusions: . sodium chloride 75 mL/hr at 03/26/14 1014    Active Problems:   UTI (lower urinary tract infection)   Hyponatremia   Altered mental status    Time spent: 35 minutes. Greater than 50% of this time was spent in direct contact with the patient coordinating care.    Lelon Frohlich  [facility] Pager [phone]  If 7PM-7AM, please contact night-coverage at [email or web address], password [facility] 03/26/2014, 3:14 PM  LOS: 2 days

## 2014-03-27 DIAGNOSIS — N179 Acute kidney failure, unspecified: Secondary | ICD-10-CM

## 2014-03-27 LAB — UIFE/LIGHT CHAINS/TP QN, 24-HR UR
Albumin, U: DETECTED
Alpha 1, Urine: DETECTED — AB
Alpha 2, Urine: DETECTED — AB
BETA UR: DETECTED — AB
GAMMA UR: DETECTED — AB
Total Protein, Urine: 46 mg/dL — ABNORMAL HIGH (ref 5–25)

## 2014-03-27 LAB — BASIC METABOLIC PANEL
ANION GAP: 10 (ref 5–15)
BUN: 31 mg/dL — AB (ref 6–23)
CHLORIDE: 92 meq/L — AB (ref 96–112)
CO2: 22 mmol/L (ref 19–32)
Calcium: 8 mg/dL — ABNORMAL LOW (ref 8.4–10.5)
Creatinine, Ser: 2 mg/dL — ABNORMAL HIGH (ref 0.50–1.35)
GFR calc Af Amer: 37 mL/min — ABNORMAL LOW (ref >90)
GFR calc non Af Amer: 32 mL/min — ABNORMAL LOW (ref >90)
Glucose, Bld: 209 mg/dL — ABNORMAL HIGH (ref 70–99)
Potassium: 4.1 mmol/L (ref 3.5–5.1)
Sodium: 124 mmol/L — ABNORMAL LOW (ref 135–145)

## 2014-03-27 LAB — PTH, INTACT AND CALCIUM
Calcium, Total (PTH): 8.2 mg/dL — ABNORMAL LOW (ref 8.4–10.5)
PTH: 22 pg/mL (ref 14–64)

## 2014-03-27 LAB — GLUCOSE, CAPILLARY
GLUCOSE-CAPILLARY: 226 mg/dL — AB (ref 70–99)
GLUCOSE-CAPILLARY: 264 mg/dL — AB (ref 70–99)
Glucose-Capillary: 246 mg/dL — ABNORMAL HIGH (ref 70–99)
Glucose-Capillary: 353 mg/dL — ABNORMAL HIGH (ref 70–99)

## 2014-03-27 LAB — URINE CULTURE

## 2014-03-27 MED ORDER — SODIUM CHLORIDE 0.9 % IV SOLN
INTRAVENOUS | Status: DC
  Administered 2014-03-27: 18:00:00 via INTRAVENOUS

## 2014-03-27 MED ORDER — MUSCLE RUB 10-15 % EX CREA
TOPICAL_CREAM | CUTANEOUS | Status: DC | PRN
  Filled 2014-03-27: qty 85

## 2014-03-27 MED ORDER — CIPROFLOXACIN HCL 250 MG PO TABS
500.0000 mg | ORAL_TABLET | Freq: Two times a day (BID) | ORAL | Status: DC
  Administered 2014-03-27 – 2014-03-28 (×2): 500 mg via ORAL
  Filled 2014-03-27 (×2): qty 2

## 2014-03-27 NOTE — Progress Notes (Signed)
Physical Therapy Treatment Patient Details Name: Albert Garrison MRN: [id number] DOB: 1946/01/12 Today's Date: 03/27/2014    History of Present Illness This is a 68 year old man who has been complaining of urinary increased frequency and dysuria for the last week or so. He has had poor by mouth intake. He has documented UTI previously. He has had chills, nausea and also vomiting. He has had some nonspecific abdominal discomfort. There is no diarrhea, rectal bleeding. Evaluation in the emergency room shows him to have urinalysis consistent with UTI and significant hyponatremia with a sodium of only 105. He is not on any diuretics. He is now being admitted for further management.    PT Comments    Pt was alert and oriented at the time of my visit today.  He was very pleasant and cooperative and per chart his medical status is improving.  The focus of treatment today was on balance in standing.  Primary deficit is noticed with a narrow base of support.  When vision component was reduced, it did not seem to affect his static standing.  He will continue to need a walker for gait as he falls backward frequently and I still recommend SNF at d/c as he is an extremely high fall risk.  Follow Up Recommendations  SNF     Equipment Recommendations  Rolling walker with 5" wheels    Recommendations for Other Services  none     Precautions / Restrictions Precautions Precautions: Fall Restrictions Weight Bearing Restrictions: No    Mobility  Bed Mobility Overal bed mobility: Modified Independent                Transfers Overall transfer level: Needs assistance Equipment used: None Transfers: Sit to/from Stand Sit to Stand: Min guard         General transfer comment: pt continues to fall backward upon standing and needs stabilization of therapist to maintain stance  Ambulation/Gait Ambulation/Gait assistance:  (focus on balance excercise today)               Stairs            Wheelchair Mobility    Modified Rankin (Stroke Patients Only)       Balance Overall balance assessment: Independent     Sitting balance - Comments: sitting balance is normal today Postural control: Posterior lean Standing balance support: No upper extremity supported Standing balance-Leahy Scale: Fair Standing balance comment: standing balance continues to be faulty especially when base of support is narrowed...he is unable to hold stance without  a wide base of support                    Cognition Arousal/Alertness: Awake/alert Behavior During Therapy: WFL for tasks assessed/performed Overall Cognitive Status: Within Functional Limits for tasks assessed                      Exercises Other Exercises Other Exercises: balance exercises in stance including:   standing erect with head turns (with wide base of support, narrow base of support), standing with eyes closed, rocking backward and forward in stance, marching in place, toe raises, semi squats, sidestepping,    General Comments        Pertinent Vitals/Pain Pain Assessment: No/denies pain    Home Living Family/patient expects to be discharged to:: Skilled nursing facility                    Prior Function Level of Independence:  Independent          PT Goals (current goals can now be found in the care plan section) Progress towards PT goals: Progressing toward goals    Frequency  Min 2X/week    PT Plan Current plan remains appropriate    Co-evaluation             End of Session Equipment Utilized During Treatment: Gait belt Activity Tolerance: Patient tolerated treatment well Patient left: in bed;with call bell/phone within reach;with bed alarm set;with family/visitor present     Time: 8676-1950 PT Time Calculation (min) (ACUTE ONLY): 25 min  Charges:  $Neuromuscular Re-education: 23-37 mins                    G Codes:      Sable Feil 04-09-14, 11:22  AM

## 2014-03-27 NOTE — Progress Notes (Signed)
Subjective: Interval History: Patient feels much better. Denies any difficulty in breathing but feels thirsty  Objective: Vital signs in last 24 hours: Temp:  [97.8 F (36.6 C)-99.5 F (37.5 C)] 97.8 F (36.6 C) (12/24 0528) Pulse Rate:  [63-64] 64 (12/24 0528) Resp:  [16-20] 18 (12/24 0528) BP: (149-153)/(64-71) 152/70 mmHg (12/24 0528) SpO2:  [97 %-98 %] 97 % (12/24 0528) Weight change:   Intake/Output from previous day: 12/23 0701 - 12/24 0700 In: 720 [P.O.:720] Out: 3600 [Urine:3600] Intake/Output this shift: Total I/O In: -  Out: 400 [Urine:400]  Generally he is alert and in no apparent distress Chest is clear to auscultatoin. Heart: RRR nio murmur Extremities: No dema  Lab Results:  Recent Labs  03/25/14 0537 03/26/14 0556  WBC 24.8* 16.8*  HGB 11.8* 11.1*  HCT 32.9* 31.9*  PLT 545* 602*   BMET:   Recent Labs  03/26/14 0556 03/27/14 0555  NA 121* 124*  K 3.8 4.1  CL 90* 92*  CO2 22 22  GLUCOSE 236* 209*  BUN 31* 31*  CREATININE 2.13* 2.00*  CALCIUM 8.1* 8.0*   No results for input(s): PTH in the last 72 hours. Iron Studies: No results for input(s): IRON, TIBC, TRANSFERRIN, FERRITIN in the last 72 hours.  Studies/Results: US Renal  03/25/2014   CLINICAL DATA:  Hypertension, acute tubular necrosis  EXAM: RENAL/URINARY TRACT ULTRASOUND COMPLETE  COMPARISON:  None.  FINDINGS: Right Kidney:  Length: 11.1 cm. Echogenicity within normal limits. No mass or hydronephrosis visualized.  Left Kidney:  Length: 13.3 cm. Echogenicity within normal limits. No mass or hydronephrosis visualized.  Bladder:  Decompressed by Foley catheter.  IMPRESSION: No acute abnormality noted.   Electronically Signed   By: [name] M.D.   On: 03/25/2014 13:09    I have reviewed the patient's current medications.  Assessment/Plan: Problem #1 hyponatremia: His sodium has impromed Problem #2 acute kidney kidney injury  etiology for his renal failure was thought to be  secondary to obstructive uropathy/Bactrim/NSAID/. His renal function continue to improve Problem #3 metabolic acidosis: Has corrected Problem #4 history of postobstructive  uropathy: Patient with Foley catheter. Polyuric . Problem #5 chronic renal failure: Stage III. Possibly comminution of diabetes/NSAID/secondary to recurrent UTI with obstructive uropathy. Problem #6 altered mental status:  Problem #7 recurrent UTI: Patient is afebrile and his white blood cell count is improving. Problem #8 anemia: Mild. Plan: We'll increaes his ivf to 125 cc/hr We'll check his basic metabolic panel in the morning.   LOS: 3 days   Lakia Gritton S 03/27/2014,8:12 AM   Pain

## 2014-03-27 NOTE — Progress Notes (Signed)
[facility] PROGRESS NOTE  Albert Garrison [id number] DOB: 11-22-1945 DOA: 03/24/2014 PCP: Albert Brunner, MD  Assessment/Plan: Profound hyponatremia -Thought to be secondary to hypovolemia. -Presently sodium is 124 and progressively improving from 106 on admission. -Appreciate nephrology assistance.  Acute encephalopathy -Improving, believe secondary to hyponatremia. -Per son is back to baseline.  Recurrent UTI -Urine culture with pansensitive serratia. -Will transition to PO cipro for 5 more days.  Diabetes -Still uncontrolled.  -Increase basal insulin to 8 units.  Acute renal failure -Thought secondary to obstructive uropathy, Bactrim, NSAIDs, hypovolemia. -Continue Foley catheter, creatinine seems to be stable around 2.1-2.5.  Code Status: Full code Family Communication: Son Albert Garrison at bedside. Disposition Plan: To skilled nursing facility when medically ready   Consultants:  Nephrology   Antibiotics:  Cipro   Subjective: No complaints  Objective: Filed Vitals:   03/26/14 1419 03/26/14 2134 03/27/14 0528 03/27/14 1322  BP: 153/71 149/64 152/70 133/63  Pulse: 63 63 64 67  Temp: 98.7 F (37.1 C) 99.5 F (37.5 C) 97.8 F (36.6 C)   TempSrc: Oral Oral Oral   Resp: 20 16 18 18   Height:      Weight:      SpO2: 97% 98% 97% 99%    Intake/Output Summary (Last 24 hours) at 03/27/14 1544 Last data filed at 03/27/14 1200  Gross per 24 hour  Intake    720 ml  Output   3600 ml  Net  -2880 ml   Filed Weights   03/24/14 1155  Weight: 68.04 kg (150 lb)    Exam:   General:  Awake, oriented  Cardiovascular: Regular rate and rhythm  Respiratory: Clear to auscultation bilaterally  Abdomen: Soft, nontender, nondistended, positive bowel sounds  Extremities: Trace bilateral pitting edema   Neurologic:  Nonfocal  Data Reviewed: Basic Metabolic Panel:  Recent Labs Lab 03/25/14 0853 03/25/14 1105 03/25/14 1839 03/26/14 0556  03/27/14 0555  NA 115* 114* 118* 121* 124*  K 4.0 4.0 4.1 3.8 4.1  CL 84* 86* 85* 90* 92*  CO2 21 18* 22 22 22   GLUCOSE 203* 236* 286* 236* 209*  BUN 32* 31* 33* 31* 31*  CREATININE 2.27* 2.15* 2.48* 2.13* 2.00*  CALCIUM 8.1* 8.0* 8.1* 8.1*  8.2* 8.0*  PHOS  --   --   --  3.1  --    Liver Function Tests:  Recent Labs Lab 03/24/14 1301 03/25/14 0537  AST 34 25  ALT 35 27  ALKPHOS 121* 80  BILITOT 0.8 0.8  PROT 7.5 6.2  ALBUMIN 2.9* 2.4*   No results for input(s): LIPASE, AMYLASE in the last 168 hours. No results for input(s): AMMONIA in the last 168 hours. CBC:  Recent Labs Lab 03/24/14 1301 03/25/14 0537 03/26/14 0556  WBC 26.9* 24.8* 16.8*  NEUTROABS 23.4*  --   --   HGB 12.9* 11.8* 11.1*  HCT 35.1* 32.9* 31.9*  MCV 80.0 80.6 82.6  PLT 607* 545* 602*   Cardiac Enzymes: No results for input(s): CKTOTAL, CKMB, CKMBINDEX, TROPONINI in the last 168 hours. BNP (last 3 results) No results for input(s): PROBNP in the last 8760 hours. CBG:  Recent Labs Lab 03/26/14 1135 03/26/14 1608 03/26/14 2138 03/27/14 0724 03/27/14 1151  GLUCAP 269* 269* 253* 226* 246*    Recent Results (from the past 240 hour(s))  Urine culture     Status: None   Collection Time: 03/24/14  1:20 PM  Result Value Ref Range Status   Specimen Description URINE,  SUPRAPUBIC  Final   Special Requests NONE  Final   Culture  Setup Time   Final    03/25/2014 04:24 Performed at [place]   Final    >=100,000 COLONIES/ML Performed at [place] Performed at [facility]    Report Status 03/27/2014 FINAL  Final   Organism ID, Bacteria SERRATIA MARCESCENS  Final      Susceptibility   Serratia marcescens - MIC*    CEFAZOLIN >=64 RESISTANT Resistant     CEFTRIAXONE <=1 SENSITIVE Sensitive     CIPROFLOXACIN <=0.25 SENSITIVE Sensitive     GENTAMICIN <=1 SENSITIVE Sensitive     LEVOFLOXACIN 0.25  SENSITIVE Sensitive     NITROFURANTOIN 256 RESISTANT Resistant     TOBRAMYCIN 4 SENSITIVE Sensitive     TRIMETH/SULFA <=20 SENSITIVE Sensitive     * SERRATIA MARCESCENS     Studies: No results found.  Scheduled Meds: . cefTRIAXone (ROCEPHIN)  IV  1 g Intravenous Q24H  . feeding supplement (ENSURE COMPLETE)  237 mL Oral BID BM  . heparin subcutaneous  5,000 Units Subcutaneous 3 times per day  . insulin aspart  0-9 Units Subcutaneous TID WC  . insulin detemir  5 Units Subcutaneous QHS   Continuous Infusions: . sodium chloride 125 mL/hr at 03/27/14 1308    Principal Problem:   Hyponatremia Active Problems:   Acute encephalopathy   UTI (lower urinary tract infection)   ARF (acute renal failure)   IDDM (insulin dependent diabetes mellitus)    Time spent: 35 minutes. Greater than 50% of this time was spent in direct contact with the patient coordinating care.    Albert Garrison  [facility] Pager [phone]  If 7PM-7AM, please contact night-coverage at [email or web address], password [facility] 03/27/2014, 3:44 PM  LOS: 3 days

## 2014-03-28 ENCOUNTER — Encounter: Payer: Self-pay | Admitting: Internal Medicine

## 2014-03-28 ENCOUNTER — Non-Acute Institutional Stay (SKILLED_NURSING_FACILITY): Payer: Medicare Other | Admitting: Internal Medicine

## 2014-03-28 ENCOUNTER — Inpatient Hospital Stay
Admission: RE | Admit: 2014-03-28 | Discharge: 2014-05-22 | Disposition: A | Discharge status: Home / Self Care | Payer: Medicare Other | Source: Ambulatory Visit | Attending: Internal Medicine | Admitting: Internal Medicine

## 2014-03-28 DIAGNOSIS — G9340 Encephalopathy, unspecified: Secondary | ICD-10-CM

## 2014-03-28 DIAGNOSIS — Z794 Long term (current) use of insulin: Secondary | ICD-10-CM

## 2014-03-28 DIAGNOSIS — E119 Type 2 diabetes mellitus without complications: Secondary | ICD-10-CM

## 2014-03-28 DIAGNOSIS — IMO0001 Reserved for inherently not codable concepts without codable children: Secondary | ICD-10-CM

## 2014-03-28 DIAGNOSIS — N390 Urinary tract infection, site not specified: Secondary | ICD-10-CM

## 2014-03-28 DIAGNOSIS — N179 Acute kidney failure, unspecified: Secondary | ICD-10-CM

## 2014-03-28 DIAGNOSIS — E871 Hypo-osmolality and hyponatremia: Secondary | ICD-10-CM

## 2014-03-28 LAB — CBC
HCT: 32.5 % — ABNORMAL LOW (ref 39.0–52.0)
HEMOGLOBIN: 10.9 g/dL — AB (ref 13.0–17.0)
MCH: 28.6 pg (ref 26.0–34.0)
MCHC: 33.5 g/dL (ref 30.0–36.0)
MCV: 85.3 fL (ref 78.0–100.0)
Platelets: 608 10*3/uL — ABNORMAL HIGH (ref 150–400)
RBC: 3.81 MIL/uL — AB (ref 4.22–5.81)
RDW: 16.8 % — ABNORMAL HIGH (ref 11.5–15.5)
WBC: 10.5 10*3/uL (ref 4.0–10.5)

## 2014-03-28 LAB — BASIC METABOLIC PANEL
Anion gap: 9 (ref 5–15)
BUN: 31 mg/dL — ABNORMAL HIGH (ref 6–23)
CO2: 23 mmol/L (ref 19–32)
CREATININE: 1.76 mg/dL — AB (ref 0.50–1.35)
Calcium: 8.2 mg/dL — ABNORMAL LOW (ref 8.4–10.5)
Chloride: 96 mEq/L (ref 96–112)
GFR calc non Af Amer: 38 mL/min — ABNORMAL LOW (ref >90)
GFR, EST AFRICAN AMERICAN: 44 mL/min — AB (ref >90)
Glucose, Bld: 208 mg/dL — ABNORMAL HIGH (ref 70–99)
POTASSIUM: 4.3 mmol/L (ref 3.5–5.1)
Sodium: 128 mmol/L — ABNORMAL LOW (ref 135–145)

## 2014-03-28 LAB — GLUCOSE, CAPILLARY
GLUCOSE-CAPILLARY: 220 mg/dL — AB (ref 70–99)
GLUCOSE-CAPILLARY: 231 mg/dL — AB (ref 70–99)

## 2014-03-28 LAB — COMPLEMENT, TOTAL: Compl, Total (CH50): >60 U/mL — ABNORMAL HIGH (ref 31–60)

## 2014-03-28 MED ORDER — CIPROFLOXACIN HCL 500 MG PO TABS: 500.0000 mg | ORAL_TABLET | Freq: Two times a day (BID) | ORAL | Status: DC

## 2014-03-28 MED ORDER — URELLE 81 MG PO TABS: 1.0000 | ORAL_TABLET | Freq: Four times a day (QID) | ORAL | Status: DC | PRN

## 2014-03-28 MED ORDER — INSULIN DETEMIR 100 UNIT/ML ~~LOC~~ SOLN: 5.0000 [IU] | Freq: Every day | SUBCUTANEOUS | Status: DC

## 2014-03-28 MED ORDER — INSULIN ASPART 100 UNIT/ML ~~LOC~~ SOLN: 0.0000 [IU] | Freq: Three times a day (TID) | SUBCUTANEOUS | Status: DC

## 2014-03-28 NOTE — Discharge Summary (Addendum)
Physician Discharge Summary  Albert Garrison [id number] DOB: 30-Oct-1945 DOA: 03/24/2014  PCP: Madelyn Brunner, MD  Admit date: 03/24/2014 Discharge date: 03/28/2014  Time spent: 45 minutes  Recommendations for Outpatient Follow-up:  -Will be discharged to SNF today. -Would recommend a BMET in 3 days to make sure sodium continues to trend upward.   Discharge Diagnoses:  Principal Problem:   Hyponatremia Active Problems:   Acute encephalopathy   UTI (lower urinary tract infection)   ARF (acute renal failure)   IDDM (insulin dependent diabetes mellitus)   Discharge Condition: Stable and improved  Filed Weights   03/24/14 1155  Weight: 68.04 kg (150 lb)    History of present illness:  This is a 68 year old man who has been complaining of urinary increased frequency and dysuria for the last week or so. He has had poor by mouth intake. He has documented UTI previously. He has had chills, nausea and also vomiting. He has had some nonspecific abdominal discomfort. There is no diarrhea, rectal bleeding. Evaluation in the emergency room shows him to have urinalysis consistent with UTI and significant hyponatremia with a sodium of only 105. He is not on any diuretics. He is now being admitted for further management. His aunt, who is present at the bedside, says he has been more confused in the last week or so, has had some difficulty with gait but no actual limb weakness. The patient himself does not describe any difficulties with speech or vision and does admit to being somewhat unstable with his gait. He has not had any falls.  Hospital Course:   Profound hyponatremia -Thought to be secondary to hypovolemia. -Presently sodium is 128 and progressively improving from 105 on admission. -Has been evaluated by nephrology.  Acute encephalopathy -Resolved. -Believe secondary to hyponatremia. -Per son is back to baseline.  Recurrent UTI -Urine culture with pansensitive  serratia. -Will transition to PO cipro for 4 more days.  Diabetes -Still uncontrolled.  -Continue to adjust insulin at SNF.  Acute renal failure on CKD Stage III -Thought secondary to obstructive uropathy, Bactrim, NSAIDs, hypovolemia. -Continue Foley catheter, creatinine seems to be stable around 2.1-2.5. -Will need to see urology as an outpatient for voiding trials.  Procedures:  None   Consultations:  Renal, Dr. Lowanda Foster  Discharge Instructions      Discharge Instructions    Increase activity slowly    Complete by:  As directed             Medication List    STOP taking these medications        sulfamethoxazole-trimethoprim 800-160 MG per tablet  Commonly known as:  SEPTRA DS      TAKE these medications        ciprofloxacin 500 MG tablet  Commonly known as:  CIPRO  Take 1 tablet (500 mg total) by mouth 2 (two) times daily. For 4 days     ibuprofen 200 MG tablet  Commonly known as:  ADVIL,MOTRIN  Take 200 mg by mouth every 6 (six) hours as needed for moderate pain.     insulin aspart 100 UNIT/ML injection  Commonly known as:  novoLOG  Inject 0-9 Units into the skin 3 (three) times daily with meals.     insulin detemir 100 UNIT/ML injection  Commonly known as:  LEVEMIR  Inject 0.05 mLs (5 Units total) into the skin at bedtime.     URELLE 81 MG Tabs tablet  Take 1 tablet (81 mg total) by mouth  every 6 (six) hours as needed for bladder spasms.       No Known Allergies Follow-up Information    Follow up with [facility] S, MD In 4 weeks.   Specialty:  Nephrology   Contact information:   [address] [address] [phone]       Follow up with [facility] S, MD In 6 weeks.   Specialty:  Nephrology   Contact information:   [address] [place] [address] [phone]        The results of significant diagnostics from this hospitalization (including imaging, microbiology, ancillary and  laboratory) are listed below for reference.    Significant Diagnostic Studies: Dg Chest 2 View  03/24/2014   CLINICAL DATA:  Hyponatremia, hypertension  EXAM: CHEST  2 VIEW  COMPARISON:  05/08/2008  FINDINGS: Cardiomediastinal silhouette is stable. No acute infiltrate or pleural effusion. No pulmonary edema. Mild elevation of the right hemidiaphragm. Degenerative changes mid and lower thoracic spine. Metallic fixation plate partially visualized cervical spine.  IMPRESSION: No active cardiopulmonary disease.   Electronically Signed   By: Lahoma Crocker M.D.   On: 03/24/2014 16:38   US Renal  03/25/2014   CLINICAL DATA:  Hypertension, acute tubular necrosis  EXAM: RENAL/URINARY TRACT ULTRASOUND COMPLETE  COMPARISON:  None.  FINDINGS: Right Kidney:  Length: 11.1 cm. Echogenicity within normal limits. No mass or hydronephrosis visualized.  Left Kidney:  Length: 13.3 cm. Echogenicity within normal limits. No mass or hydronephrosis visualized.  Bladder:  Decompressed by Foley catheter.  IMPRESSION: No acute abnormality noted.   Electronically Signed   By: [name] M.D.   On: 03/25/2014 13:09    Microbiology: Recent Results (from the past 240 hour(s))  Urine culture     Status: None   Collection Time: 03/24/14  1:20 PM  Result Value Ref Range Status   Specimen Description URINE, SUPRAPUBIC  Final   Special Requests NONE  Final   Culture  Setup Time   Final    03/25/2014 04:24 Performed at [place]   Final    >=100,000 COLONIES/ML Performed at [facility]    Culture   Final    SERRATIA MARCESCENS Performed at [facility]    Report Status 03/27/2014 FINAL  Final   Organism ID, Bacteria SERRATIA MARCESCENS  Final      Susceptibility   Serratia marcescens - MIC*    CEFAZOLIN >=64 RESISTANT Resistant     CEFTRIAXONE <=1 SENSITIVE Sensitive     CIPROFLOXACIN <=0.25 SENSITIVE Sensitive     GENTAMICIN <=1 SENSITIVE Sensitive     LEVOFLOXACIN 0.25  SENSITIVE Sensitive     NITROFURANTOIN 256 RESISTANT Resistant     TOBRAMYCIN 4 SENSITIVE Sensitive     TRIMETH/SULFA <=20 SENSITIVE Sensitive     * SERRATIA MARCESCENS     Labs: Basic Metabolic Panel:  Recent Labs Lab 03/25/14 1105 03/25/14 1839 03/26/14 0556 03/27/14 0555 03/28/14 0617  NA 114* 118* 121* 124* 128*  K 4.0 4.1 3.8 4.1 4.3  CL 86* 85* 90* 92* 96  CO2 18* 22 22 22 23   GLUCOSE 236* 286* 236* 209* 208*  BUN 31* 33* 31* 31* 31*  CREATININE 2.15* 2.48* 2.13* 2.00* 1.76*  CALCIUM 8.0* 8.1* 8.1*  8.2* 8.0* 8.2*  PHOS  --   --  3.1  --   --    Liver Function Tests:  Recent Labs Lab 03/24/14 1301 03/25/14 0537  AST 34  25  ALT 35 27  ALKPHOS 121* 80  BILITOT 0.8 0.8  PROT 7.5 6.2  ALBUMIN 2.9* 2.4*   No results for input(s): LIPASE, AMYLASE in the last 168 hours. No results for input(s): AMMONIA in the last 168 hours. CBC:  Recent Labs Lab 03/24/14 1301 03/25/14 0537 03/26/14 0556 03/28/14 0617  WBC 26.9* 24.8* 16.8* 10.5  NEUTROABS 23.4*  --   --   --   HGB 12.9* 11.8* 11.1* 10.9*  HCT 35.1* 32.9* 31.9* 32.5*  MCV 80.0 80.6 82.6 85.3  PLT 607* 545* 602* 608*   Cardiac Enzymes: No results for input(s): CKTOTAL, CKMB, CKMBINDEX, TROPONINI in the last 168 hours. BNP: BNP (last 3 results) No results for input(s): PROBNP in the last 8760 hours. CBG:  Recent Labs Lab 03/27/14 1151 03/27/14 1644 03/27/14 2152 03/28/14 0738 03/28/14 1139  GLUCAP 246* 353* 264* 220* 231*       Signed:  HERNANDEZ ACOSTA,ESTELA  [facility] Pager: [phone] 03/28/2014, 2:47 PM

## 2014-03-28 NOTE — Progress Notes (Signed)
Subjective: Interval History: Patient offers no complaints. Presently he denies any difficulty breathing.  Objective: Vital signs in last 24 hours: Temp:  [97.7 F (36.5 C)-99 F (37.2 C)] 97.7 F (36.5 C) (12/25 0359) Pulse Rate:  [65-70] 65 (12/25 0359) Resp:  [18-20] 18 (12/25 0359) BP: (133-161)/(63-81) 154/81 mmHg (12/25 0359) SpO2:  [94 %-99 %] 97 % (12/25 0359) Weight change:   Intake/Output from previous day: 12/24 0701 - 12/25 0700 In: 1073.3 [P.O.:960; I.V.:113.3] Out: 3450 [Urine:3450] Intake/Output this shift: Total I/O In: 480 [P.O.:480] Out: 1250 [Urine:1250]  Generally he is alert and in no apparent distress Chest is clear to auscultatoin. Heart: RRR nio murmur Extremities: No dema  Lab Results:  Recent Labs  03/26/14 0556 03/28/14 0617  WBC 16.8* 10.5  HGB 11.1* 10.9*  HCT 31.9* 32.5*  PLT 602* 608*   BMET:   Recent Labs  03/27/14 0555 03/28/14 0617  NA 124* 128*  K 4.1 4.3  CL 92* 96  CO2 22 23  GLUCOSE 209* 208*  BUN 31* 31*  CREATININE 2.00* 1.76*  CALCIUM 8.0* 8.2*    Recent Labs  03/26/14 0556  PTH 22   Iron Studies: No results for input(s): IRON, TIBC, TRANSFERRIN, FERRITIN in the last 72 hours.  Studies/Results: No results found.  I have reviewed the patient's current medications.  Assessment/Plan: Problem #1 hyponatremia: His sodium is progressively improving and patient is asymptomatic Problem #2 acute kidney kidney injury , thought to be secondary to obstructive uropathy/Bactrim/NSAID/. His renal function is improving and presently returning to his base line Problem #3 metabolic acidosis: Has corrected Problem #4 history of postobstructive  uropathy: Patient with Foley catheter. Polyuric . Problem #5 chronic renal failure: Stage III. Possibly comminution of diabetes/NSAID/secondary to recurrent UTI with obstructive uropathy. Problem #6 altered mental status:  Problem #7 recurrent UTI: Patient is afebrile and his  white blood cell count is normal Problem #8 anemia: Mild. Problem#9 Protienuria ; None nephrotic range. His 24 hour urine show some Kappa chain,possibly MGUS  Plan: Continue with present treatment Possible Oncology consult as out patient for his protienuria We'll check his basic metabolic panel in the morning.   LOS: 4 days   Albert Garrison S 03/28/2014,9:08 AM   Pain

## 2014-03-28 NOTE — Progress Notes (Signed)
Patient ID: Albert Garrison, male   DOB: 21-Apr-1945, 68 y.o.   MRN: [id number]   This is an acute visit.  Level care skilled.  Facility [facility].  Chief complaint-acute visit status post hospitalization for hyponatremia-UTI.  History of present illness.  Patient is a pleasant 68 year old male who had complained of increased frequency and dysuria for several days-he had poor by mouth intake-apparently he developed chills nausea and vomiting and abdominal discomfort.  In the ER he was found to have a UTI and significant hyponatremia with a sodium of 105.  Apparently did have some increased confusion the week previous.  In regards to the hyponatremia this was thought the second dairy to volume depletion-sodium on discharge from hospital was 128-nephrology did evaluate him.  His encephalopathy apparently resolved thought secondary to the low sodium.  Urine culture did grow out [employer] has been discharged on Cipro for 4 additional days.  Regards to diabetes apparently his blood sugars continue to run high or discharge summary he is on a sliding scale as well as 5 units of Lantus this will have to be monitored closely.  Patient also was found to have acute renal failure chronic stage III disease as well-this was thought secondary to obstructive uropathy as well as previous Bactrim use NSAIDs) depleted volume-currently has a Foley catheter and creatinine apparently stabilized in the low twos-suggestion for urology consult as an outpatient.  Currently patient is resting comfortably in his vital signs are stable he has no acute complaints -- catheter is draining light amber colored urine.  Previous medical history.  Hyponatremia.  Acute encephalopathy resolved.  UTI.  Acute on chronic renal failure.  Insulin-dependent diabetes.    Previous surgical history.  Cervical spine surgery.  Arthroscopic knee procedure.  Carpal tunnel release.  Lumbar  laminectomy.  Social history-patient lives with his son in [place]-he is a former smoker-no illicit drug use-drinks about 3 cans of beer a week.  Medications.  Ciprofloxacin 500 mg twice a day 4 additional days.  Motrin 200 mg every 6 hours when necessary moderate pain.  Sliding scale NovoLog insulin.  Levemir 5 units daily at at bedtime.  Urelle 81 mg every 6 hours when necessary bladder spasms.  Review of systems.  In general says he feels well does not complaining of fever or chills.  Skin appears to have a slight rash on her supper arms bilaterally nerves no itching however he says this is chronic and has had this for some time.  Head ears eyes nose mouth and throat-no visual changes sore throat or nasal discharge noted.  Respiratory does not complain of shortness breath or cough.  Cardiac does not complain of chest pain or edema.  GI says he has an improving appetite does not complain of nausea vomiting diarrhea constipation or abdominal discomfort.  GU-as noted above does not complain of dysuria does have an indwelling Foley catheter currently.  Musculoskeletal has weakness as rescue lower extremities but does not complain of pain.  Neurologic does not complain of dizziness headache or numbness nor syncopal episodes.  Psych does not complain of depression or anxiety.  Physical exam.  In general this is a somewhat frail elderly male in no distress lying comfortably in bed.  His skin is warm and dry he does have a somewhat diffuse macular papular rash of his upper arms bilaterally.  Eyes pupils appear reactive light sclerae and conjunctivae clear visual acuity appears grossly intact.  Oropharynx is clear mucous membranes moist.  Chest  is clear to auscultation there is no labored breathing.  Heart regular rate and rhythm without murmur gallop or rub-he does not have significant lower extremity edema pedal pulses are somewhat reduced bilaterally.  Abdomen  is soft nontender positive bowel sounds.  GU-she does have a Foley catheter draining light amber colored urine could not really appreciate any suprapubic tenderness or distention.  Musculoskeletal I did not ambulate him he says he is quite weak and has not ambulated in the hospital-does move all extremities 4 grip strength appears adequate bilaterally I do not note any deformities although he is quite frail-especially regards to his lower extremities.  Neurologic is grossly intact speech is clear I do not see any lateralizing findings.  Psych he appears alert and oriented pleasant and appropriate.  Labs.  03/28/2014.  Sodium 128 potassium 4.3 BUN 31 creatinine 1.76.  WBC 10.5 hemoglobin 10.9 platelets 608.  Assessment and plan.  #1-hypernatremia this was quite significant on admission to hospital-this has trended up per chart review-will obtain a metabolic panel next lab day-this was thought to be caused essentially by volume depletion complicated with a urinary tract infection.  #2 acute encephalopathy this appears resolved thought secondary to hyponatremia.  #3 UTI he is finishing a course of ciprofloxacin for Serratia--this is complicated with a history of obstructive uropathy-she does have a indwelling Foley catheter will need urology follow-up.  #4-chronic renal insufficiency apparently this has returned to baseline before discharge in fact creatinine was 1.76 which appears to be improved from recent baseline-again will update a metabolic panel next laboratory day.  #5-leukocytosis I suspect this was thought due to the urinary tract infection trended down during the hospitalization-will update CBC next laboratory day.  #6 history of insulin-dependent diabetes apparently this was relatively uncontrolled the hospital recent CBGs b were in the 200s-he is on Lantus 5 units daily as well as a sliding scale with meals-at this point will monitor with CBGs before meals and at bedtime with  a sliding scale with meals.  #7-rash arms-will start low-dose triamcinolone cream per facility protocol and monitor   [id number]-of note greater than 35 minutes spent assessing patient-reviewing his medical records-and coordinating and formulating a plan of care for numerous diagnoses-of note greater than 50% of time spent coordinating plan of care

## 2014-03-31 ENCOUNTER — Non-Acute Institutional Stay (SKILLED_NURSING_FACILITY): Payer: Medicare Other | Admitting: Internal Medicine

## 2014-03-31 DIAGNOSIS — N183 Chronic kidney disease, stage 3 (moderate): Secondary | ICD-10-CM

## 2014-03-31 DIAGNOSIS — E871 Hypo-osmolality and hyponatremia: Secondary | ICD-10-CM

## 2014-03-31 DIAGNOSIS — E1121 Type 2 diabetes mellitus with diabetic nephropathy: Secondary | ICD-10-CM

## 2014-03-31 DIAGNOSIS — N179 Acute kidney failure, unspecified: Secondary | ICD-10-CM

## 2014-03-31 LAB — GLUCOSE, CAPILLARY
GLUCOSE-CAPILLARY: 311 mg/dL — AB (ref 70–99)
GLUCOSE-CAPILLARY: 310 mg/dL — AB (ref 70–99)
GLUCOSE-CAPILLARY: 328 mg/dL — AB (ref 70–99)
Glucose-Capillary: 346 mg/dL — ABNORMAL HIGH (ref 70–99)
Glucose-Capillary: 246 mg/dL — ABNORMAL HIGH (ref 70–99)
Glucose-Capillary: 263 mg/dL — ABNORMAL HIGH (ref 70–99)

## 2014-03-31 NOTE — Progress Notes (Signed)
Patient ID: Albert Garrison, male   DOB: [birth year], 68 y.o.   MRN: [id number]  Facility; [facility] Chief complaint; admission to SNF post admit to [facility] from 12/21 to 12/25  History; this is a 67 year old man who presented to the emergency room with chills, nausea and vomiting with some nonspecific abdominal discomfort. He had no diarrhea. He was felt to have a UTI and noted to have significant hyponatremia with a sodium of 105. I note that his sodium was 129 in October 2015, at which time his creatinine was 1.57. He was noted to be in acute renal failure on presentation with a sodium of 105. I am not precisely sure how this was corrected however his sodium gradually improved to 128. He was seen by nephrology. Further studies revealed a urine osmolality of 160 a serum osmolality of 236 repeated at 190 and 264 respectively. His urine sodium was 29. TSH at 4.872. PTH at 22 random cortisol was within the normal range. His acute renal failure was felt to be secondary to obstructive uropathy, Bactrim, NSAIDs and hypovolemia. It is recommended that he see urology as an outpatient for voiding trials. He was noted to be acutely confused which returned to baseline at discharge showed presumably as his sodium increased. He is noted to have recurrent UTIs his urine culture was pansensitive Serratia treated with Cipro for 4 more days. He was not on any diuretics and currently the patient denies nausea vomiting or diarrhea. He states he did drink a lot of water and did note polyuria. Stated he drank water to "flush the microbes out of his system"  Past Medical History  Diagnosis Date  . Hypertension     Past Surgical History  Procedure Laterality Date  . Cervical spine surgery  2010  . Knee arthroscopy    . Carpal tunnel release    . Lumbar laminectomy      Current Outpatient Prescriptions on File Prior to Visit  Medication Sig Dispense Refill  . ciprofloxacin (CIPRO) 500 MG tablet Take 1 tablet (500 mg  total) by mouth 2 (two) times daily. For 4 days    . ibuprofen (ADVIL,MOTRIN) 200 MG tablet Take 200 mg by mouth every 6 (six) hours as needed for moderate pain.    Marland Kitchen insulin aspart (NOVOLOG) 100 UNIT/ML injection Inject 0-9 Units into the skin 3 (three) times daily with meals. 10 mL 11  . insulin detemir (LEVEMIR) 100 UNIT/ML injection Inject 0.05 mLs (5 Units total) into the skin at bedtime. 10 mL 11  . URELLE (URELLE/URISED) 81 MG TABS tablet Take 1 tablet (81 mg total) by mouth every 6 (six) hours as needed for bladder spasms. 120 each       Social; patient states he lives at home with his son although he gives me the opinion that that may be changing. States he is independent with ADLs and IADLs still driving. Smoking 40 years ago. No excessive alcohol use. No ambulatory assist devices. He was not on insulin prior to this admission states he was on metformin at one time that made him sick.  reports that he has quit smoking. He has never used smokeless tobacco. He reports that he drinks about 1.8 oz of alcohol per week. He reports that he does not use illicit drugs.  Family history; none of relevance obtained  Review of systems; HEENT; no headache Respiratory no cough no sputum no hemoptysis Cardiac no chest pain GI no nausea vomiting or diarrhea GU he denies prior  voiding difficulties now has a Foley catheter Gait; not completely sure about the history here he states he "walked into the hospital"  Physical examination Gen. patient is in no distress alert and appears to be able to talk some about his own history. One of the staff members reported that he seems to talk about himself in the pleural although I did not note this Vitals; O2 sat 95% respirations 16 and unlabored pulse rate 79 and regular HEENT; pupils equal and reactive, slight lid lag noted. Mucous membranes are moist Lymph nonpalpable in the cervical, axillary or clavicular areas Vascular; has a pulsatile area just below  the lateral aspect of the clavicle. I suspect this may be a subclavian artery aneurysm Respiratory; clear air entry bilaterally no crackles or wheezes. No clubbing is noted Cardiac; euvolemic heart sounds are normal no gallops Abdomen no masses no liver no spleen no tenderness GU bladder is not distended no CVA tenderness. Has a Foley catheter in place Extremities; muscle wasting in the quads but no fasciculations Neurologic; extraocular movements are normal mild flattening of the right nasal labial fold no pronator drift reflexes are symmetrically 1 process both toes are downgoing no Hoffman's reflex Gait; wide-based and unsteady. Balance testing was not all that bad  Impression/plan #1 extreme hyponatremia; felt to be secondary to hypovolemia. I'm not sure how this was replaced. Discharge sodium was 128. Follow-up is pending so far this morning. #2 acute on chronic renal failure secondary to hypovolemia, Bactrim which she was apparently on, obstructive uropathy. #3 obstructive uropathy. I'll need to check his rectal exam when I am next in the building. Based on this he'll either get a urology referral or an attempted a voiding trial here #4 type 2 diabetes; now on insulin Levemir although he tells me he was not on anything at home certainly not insulin. Tells me he was on metformin for a period of time however it "made him sick" #5 diabetic renal failure stage III #6 UTI with Serratia discharged on Cipro with an apparent history of recurrent UTIs #7 was on Septra prior to admission I am not sure whether this was for prophylaxis or acute treatment #8 gait ataxia the reason behind this was not clear. He had a marginal vitamin D level which probably should be replaced #9 metabolic encephalopathy with which resolved as his hyponatremia was corrected  Major concern is the hyponatremia which will need to be followed carefully. As long as the risks been no further tests will be indicated. However I  note that his sodium was 129 in October.

## 2014-03-31 NOTE — Care Management Utilization Note (Signed)
Ur complete.

## 2014-04-01 LAB — GLUCOSE, CAPILLARY
GLUCOSE-CAPILLARY: 305 mg/dL — AB (ref 70–99)
Glucose-Capillary: 268 mg/dL — ABNORMAL HIGH (ref 70–99)
Glucose-Capillary: 362 mg/dL — ABNORMAL HIGH (ref 70–99)
Glucose-Capillary: 271 mg/dL — ABNORMAL HIGH (ref 70–99)

## 2014-04-02 ENCOUNTER — Non-Acute Institutional Stay (SKILLED_NURSING_FACILITY): Payer: Medicare Other | Admitting: Internal Medicine

## 2014-04-02 DIAGNOSIS — E871 Hypo-osmolality and hyponatremia: Secondary | ICD-10-CM

## 2014-04-02 DIAGNOSIS — N179 Acute kidney failure, unspecified: Secondary | ICD-10-CM

## 2014-04-02 LAB — GLUCOSE, CAPILLARY
GLUCOSE-CAPILLARY: 345 mg/dL — AB (ref 70–99)
GLUCOSE-CAPILLARY: 250 mg/dL — AB (ref 70–99)
Glucose-Capillary: 251 mg/dL — ABNORMAL HIGH (ref 70–99)
Glucose-Capillary: 401 mg/dL — ABNORMAL HIGH (ref 70–99)

## 2014-04-03 LAB — GLUCOSE, CAPILLARY
GLUCOSE-CAPILLARY: 196 mg/dL — AB (ref 70–99)
GLUCOSE-CAPILLARY: 269 mg/dL — AB (ref 70–99)
GLUCOSE-CAPILLARY: 292 mg/dL — AB (ref 70–99)
GLUCOSE-CAPILLARY: 325 mg/dL — AB (ref 70–99)
GLUCOSE-CAPILLARY: 330 mg/dL — AB (ref 70–99)
Glucose-Capillary: 250 mg/dL — ABNORMAL HIGH (ref 70–99)

## 2014-04-04 LAB — GLUCOSE, CAPILLARY
GLUCOSE-CAPILLARY: 240 mg/dL — AB (ref 70–99)
GLUCOSE-CAPILLARY: 265 mg/dL — AB (ref 70–99)
Glucose-Capillary: 230 mg/dL — ABNORMAL HIGH (ref 70–99)
Glucose-Capillary: 257 mg/dL — ABNORMAL HIGH (ref 70–99)

## 2014-04-05 LAB — GLUCOSE, CAPILLARY
GLUCOSE-CAPILLARY: 146 mg/dL — AB (ref 70–99)
GLUCOSE-CAPILLARY: 400 mg/dL — AB (ref 70–99)
Glucose-Capillary: 218 mg/dL — ABNORMAL HIGH (ref 70–99)
Glucose-Capillary: 275 mg/dL — ABNORMAL HIGH (ref 70–99)

## 2014-04-06 LAB — GLUCOSE, CAPILLARY
GLUCOSE-CAPILLARY: 162 mg/dL — AB (ref 70–99)
GLUCOSE-CAPILLARY: 260 mg/dL — AB (ref 70–99)
Glucose-Capillary: 113 mg/dL — ABNORMAL HIGH (ref 70–99)
Glucose-Capillary: 175 mg/dL — ABNORMAL HIGH (ref 70–99)
Glucose-Capillary: 201 mg/dL — ABNORMAL HIGH (ref 70–99)
Glucose-Capillary: 300 mg/dL — ABNORMAL HIGH (ref 70–99)

## 2014-04-07 LAB — GLUCOSE, CAPILLARY
GLUCOSE-CAPILLARY: 171 mg/dL — AB (ref 70–99)
GLUCOSE-CAPILLARY: 223 mg/dL — AB (ref 70–99)
GLUCOSE-CAPILLARY: 156 mg/dL — AB (ref 70–99)
GLUCOSE-CAPILLARY: 291 mg/dL — AB (ref 70–99)
Glucose-Capillary: 172 mg/dL — ABNORMAL HIGH (ref 70–99)

## 2014-04-07 NOTE — Progress Notes (Signed)
Patient ID: Albert Garrison, male   DOB: [birth year], 69 y.o.   MRN: [id number]               PROGRESS NOTE  DATE:  04/02/2014    FACILITY: [facility]    LEVEL OF CARE:   SNF   Acute Visit   CHIEF COMPLAINT:  Review of voiding difficulties/urinary obstruction.     HISTORY OF PRESENT ILLNESS:  This is a gentleman who came to [place] recently, having presented in multifactorial acute renal failure including prerenal azotemia with a profound hyponatremia, obstructive uropathy, and also felt to be a component of Bactrim toxicity.    He  apparently has a history of recurrent UTIs.  I am not sure whether he was being treated before this with Bactrim for an active UTI or he was on this for prophylaxis.  In any case, he came to [place] with a Foley catheter in place, with instructions to follow up with Urology.    His lab work has actually looked stable.  His sodium from today is up to 133, BUN of 42, creatinine at 2.03.  This is stable from the hospitalization.  I am not exactly sure what his baseline creatinine is.  This will need to be periodically monitored.    The patient tells me that he has had urinary frequency at night, and perhaps some hesitancy.  His history is vague.  He saw a urologist in [place] years ago, but has not followed up.    PHYSICAL EXAMINATION:   GASTROINTESTINAL:   RECTAL:  His prostate is moderately enlarged.  There is a nodule in the lower pole.    ASSESSMENT/PLAN:                      Obstructive uropathy.  I am going to leave the Foley catheter in place, start him on Flomax, and refer him to Urology.  We can do bladder scans here.  However, the prostate is generous enough that I would be concerned about retention.    Chronic renal failure.  It would appear from October that his creatinine was 1.57.  At that time, his sodium was 129.  He may have some degree of chronic renal insufficiency.     CPT CODE: 92010

## 2014-04-08 LAB — GLUCOSE, CAPILLARY
GLUCOSE-CAPILLARY: 212 mg/dL — AB (ref 70–99)
Glucose-Capillary: 174 mg/dL — ABNORMAL HIGH (ref 70–99)
Glucose-Capillary: 223 mg/dL — ABNORMAL HIGH (ref 70–99)

## 2014-04-09 LAB — GLUCOSE, CAPILLARY
GLUCOSE-CAPILLARY: 159 mg/dL — AB (ref 70–99)
Glucose-Capillary: 130 mg/dL — ABNORMAL HIGH (ref 70–99)
Glucose-Capillary: 183 mg/dL — ABNORMAL HIGH (ref 70–99)
Glucose-Capillary: 268 mg/dL — ABNORMAL HIGH (ref 70–99)

## 2014-04-10 LAB — GLUCOSE, CAPILLARY
GLUCOSE-CAPILLARY: 158 mg/dL — AB (ref 70–99)
GLUCOSE-CAPILLARY: 211 mg/dL — AB (ref 70–99)
Glucose-Capillary: 152 mg/dL — ABNORMAL HIGH (ref 70–99)
Glucose-Capillary: 179 mg/dL — ABNORMAL HIGH (ref 70–99)

## 2014-04-11 LAB — GLUCOSE, CAPILLARY
GLUCOSE-CAPILLARY: 219 mg/dL — AB (ref 70–99)
GLUCOSE-CAPILLARY: 309 mg/dL — AB (ref 70–99)
Glucose-Capillary: 113 mg/dL — ABNORMAL HIGH (ref 70–99)
Glucose-Capillary: 125 mg/dL — ABNORMAL HIGH (ref 70–99)

## 2014-04-12 LAB — GLUCOSE, CAPILLARY
GLUCOSE-CAPILLARY: 129 mg/dL — AB (ref 70–99)
GLUCOSE-CAPILLARY: 147 mg/dL — AB (ref 70–99)
Glucose-Capillary: 106 mg/dL — ABNORMAL HIGH (ref 70–99)
Glucose-Capillary: 252 mg/dL — ABNORMAL HIGH (ref 70–99)

## 2014-04-13 LAB — GLUCOSE, CAPILLARY
GLUCOSE-CAPILLARY: 143 mg/dL — AB (ref 70–99)
GLUCOSE-CAPILLARY: 167 mg/dL — AB (ref 70–99)
GLUCOSE-CAPILLARY: 241 mg/dL — AB (ref 70–99)
Glucose-Capillary: 127 mg/dL — ABNORMAL HIGH (ref 70–99)

## 2014-04-14 LAB — GLUCOSE, CAPILLARY
GLUCOSE-CAPILLARY: 101 mg/dL — AB (ref 70–99)
GLUCOSE-CAPILLARY: 146 mg/dL — AB (ref 70–99)
Glucose-Capillary: 181 mg/dL — ABNORMAL HIGH (ref 70–99)

## 2014-04-15 LAB — GLUCOSE, CAPILLARY
GLUCOSE-CAPILLARY: 108 mg/dL — AB (ref 70–99)
Glucose-Capillary: 191 mg/dL — ABNORMAL HIGH (ref 70–99)

## 2014-04-16 LAB — GLUCOSE, CAPILLARY
GLUCOSE-CAPILLARY: 131 mg/dL — AB (ref 70–99)
GLUCOSE-CAPILLARY: 164 mg/dL — AB (ref 70–99)
Glucose-Capillary: 157 mg/dL — ABNORMAL HIGH (ref 70–99)

## 2014-04-17 LAB — GLUCOSE, CAPILLARY
GLUCOSE-CAPILLARY: 263 mg/dL — AB (ref 70–99)
Glucose-Capillary: 92 mg/dL (ref 70–99)
Glucose-Capillary: 124 mg/dL — ABNORMAL HIGH (ref 70–99)

## 2014-04-18 LAB — GLUCOSE, CAPILLARY
Glucose-Capillary: 85 mg/dL (ref 70–99)
Glucose-Capillary: 171 mg/dL — ABNORMAL HIGH (ref 70–99)
Glucose-Capillary: 117 mg/dL — ABNORMAL HIGH (ref 70–99)
Glucose-Capillary: 174 mg/dL — ABNORMAL HIGH (ref 70–99)

## 2014-04-18 LAB — OSMOLALITY: Osmolality: 236 mOsm/kg — CL (ref 275–300)

## 2014-04-19 LAB — GLUCOSE, CAPILLARY
Glucose-Capillary: 89 mg/dL (ref 70–99)
Glucose-Capillary: 192 mg/dL — ABNORMAL HIGH (ref 70–99)

## 2014-04-20 LAB — GLUCOSE, CAPILLARY
GLUCOSE-CAPILLARY: 120 mg/dL — AB (ref 70–99)
Glucose-Capillary: 248 mg/dL — ABNORMAL HIGH (ref 70–99)

## 2014-04-21 LAB — GLUCOSE, CAPILLARY
GLUCOSE-CAPILLARY: 122 mg/dL — AB (ref 70–99)
GLUCOSE-CAPILLARY: 126 mg/dL — AB (ref 70–99)
Glucose-Capillary: 86 mg/dL (ref 70–99)

## 2014-04-22 LAB — GLUCOSE, CAPILLARY
GLUCOSE-CAPILLARY: 83 mg/dL (ref 70–99)
GLUCOSE-CAPILLARY: 144 mg/dL — AB (ref 70–99)
GLUCOSE-CAPILLARY: 147 mg/dL — AB (ref 70–99)
Glucose-Capillary: 151 mg/dL — ABNORMAL HIGH (ref 70–99)

## 2014-04-23 ENCOUNTER — Non-Acute Institutional Stay (SKILLED_NURSING_FACILITY): Payer: PPO | Admitting: Internal Medicine

## 2014-04-23 DIAGNOSIS — R338 Other retention of urine: Secondary | ICD-10-CM | POA: Diagnosis not present

## 2014-04-23 DIAGNOSIS — N401 Enlarged prostate with lower urinary tract symptoms: Secondary | ICD-10-CM

## 2014-04-23 DIAGNOSIS — N2889 Other specified disorders of kidney and ureter: Secondary | ICD-10-CM

## 2014-04-23 LAB — GLUCOSE, CAPILLARY
GLUCOSE-CAPILLARY: 217 mg/dL — AB (ref 70–99)
GLUCOSE-CAPILLARY: 170 mg/dL — AB (ref 70–99)
Glucose-Capillary: 121 mg/dL — ABNORMAL HIGH (ref 70–99)
Glucose-Capillary: 109 mg/dL — ABNORMAL HIGH (ref 70–99)
Glucose-Capillary: 117 mg/dL — ABNORMAL HIGH (ref 70–99)

## 2014-04-24 LAB — GLUCOSE, CAPILLARY
Glucose-Capillary: 75 mg/dL (ref 70–99)
Glucose-Capillary: 150 mg/dL — ABNORMAL HIGH (ref 70–99)

## 2014-04-25 LAB — GLUCOSE, CAPILLARY
GLUCOSE-CAPILLARY: 113 mg/dL — AB (ref 70–99)
Glucose-Capillary: 75 mg/dL (ref 70–99)
Glucose-Capillary: 161 mg/dL — ABNORMAL HIGH (ref 70–99)
Glucose-Capillary: 165 mg/dL — ABNORMAL HIGH (ref 70–99)

## 2014-04-26 LAB — GLUCOSE, CAPILLARY
GLUCOSE-CAPILLARY: 73 mg/dL (ref 70–99)
Glucose-Capillary: 186 mg/dL — ABNORMAL HIGH (ref 70–99)

## 2014-04-27 LAB — GLUCOSE, CAPILLARY
GLUCOSE-CAPILLARY: 122 mg/dL — AB (ref 70–99)
GLUCOSE-CAPILLARY: 133 mg/dL — AB (ref 70–99)
GLUCOSE-CAPILLARY: 144 mg/dL — AB (ref 70–99)
Glucose-Capillary: 78 mg/dL (ref 70–99)
Glucose-Capillary: 149 mg/dL — ABNORMAL HIGH (ref 70–99)
Glucose-Capillary: 198 mg/dL — ABNORMAL HIGH (ref 70–99)

## 2014-04-28 LAB — GLUCOSE, CAPILLARY
GLUCOSE-CAPILLARY: 115 mg/dL — AB (ref 70–99)
GLUCOSE-CAPILLARY: 176 mg/dL — AB (ref 70–99)
Glucose-Capillary: 85 mg/dL (ref 70–99)
Glucose-Capillary: 142 mg/dL — ABNORMAL HIGH (ref 70–99)

## 2014-04-28 NOTE — Progress Notes (Addendum)
Patient ID: Albert Garrison, male   DOB: [birth year], 69 y.o.   MRN: [id number]               PROGRESS NOTE  DATE:  04/23/2014                FACILITY: [facility]      LEVEL OF CARE:   SNF   Acute Visit   CHIEF COMPLAINT:  Follow up urinary retention, acute renal failure.                  HISTORY OF PRESENT ILLNESS:  This is a patient who came to [place] after a stay at [facility] from 03/24/2014 through 03/28/2014.  At that point, he was noted to have a UTI, urinary retention with obstructive uropathy.  It would appear that he may have also had Bactrim-induced acute-on-chronic renal insufficiency.                               His sodium on presentation was 105.  His urine osmolality was 160, his serum osmolality of 236.  His urine sodium was 29.  Last repeat that we have done of this was on 04/02/2014 at 133.  His BUN was 42 and creatinine of 2.03.    I did check his prostate earlier in this admission.   This was moderately enlarged with a nodule.  I asked for a Urology follow-up, put him on Flomax.     According to the patient (he seems to give a fairly good account of this), he went to the urologist.  They pulled his Foley catheter, filled his bladder, and he still could not void.  They put the catheter back in.  It would appear that he needs to go home with a Foley catheter.    There is a note in the chart from Dr. Jeffie Pollock.  It says he has retention with a history of persistent Serratia infection.  He did not have significant obstruction by cysto and was felt to have possibly a hypotonic bladder.  He wrote an order to stop Cipro, although I do not see that he was on this, and begin trimethoprim at 100 mg p.o. q.d.  Urodynamics would be set up once he leaves here.    SOCIAL HISTORY:   His social history is complex.   MARITAL HISTORY:  He has had a recent divorce.   HOUSING:  His house is in foreclosure.  As I understand things, he cannot go back to the situation that he was in  before hospitalization.                                    PHYSICAL EXAMINATION:   GENERAL APPEARANCE:  he patient does not look to be in any distress.   CHEST/RESPIRATORY:   Clear air entry bilaterally.   CARDIOVASCULAR:  CARDIAC:   Heart sounds are normal.  There are no murmurs.  He appears to be euvolemic.   GASTROINTESTINAL:  LIVER/SPLEEN/KIDNEYS:  No liver, no spleen.  No tenderness.                      ASSESSMENT/PLAN:                                Urinary  retention.  He did have a moderately enlarged prostate on my exam, although I do not see Dr. Ralene Muskrat note on this.  Although he was said to stop Cipro, I do not really see that he was on Cipro.  I do not see the trimethoprim order, either.  I am uncertain where we are with his urinary retention.   There is question of a hypotonic bladder.  The patient tells me that when his bladder was filled in Dr. Ralene Muskrat office, he did not even feel it.    It is clear than when he leaves here, he is going to need a Foley catheter.  I do believe, finally, that he is on trimethoprim although I do not believe he was on Cipro.  I will check his BUN, creatinine, and sodium.

## 2014-04-29 LAB — GLUCOSE, CAPILLARY
GLUCOSE-CAPILLARY: 91 mg/dL (ref 70–99)
Glucose-Capillary: 217 mg/dL — ABNORMAL HIGH (ref 70–99)

## 2014-04-30 LAB — GLUCOSE, CAPILLARY
GLUCOSE-CAPILLARY: 210 mg/dL — AB (ref 70–99)
Glucose-Capillary: 125 mg/dL — ABNORMAL HIGH (ref 70–99)
Glucose-Capillary: 163 mg/dL — ABNORMAL HIGH (ref 70–99)

## 2014-05-01 LAB — GLUCOSE, CAPILLARY
Glucose-Capillary: 83 mg/dL (ref 70–99)
Glucose-Capillary: 105 mg/dL — ABNORMAL HIGH (ref 70–99)
Glucose-Capillary: 209 mg/dL — ABNORMAL HIGH (ref 70–99)

## 2014-05-02 LAB — GLUCOSE, CAPILLARY
GLUCOSE-CAPILLARY: 78 mg/dL (ref 70–99)
Glucose-Capillary: 168 mg/dL — ABNORMAL HIGH (ref 70–99)
Glucose-Capillary: 129 mg/dL — ABNORMAL HIGH (ref 70–99)
Glucose-Capillary: 176 mg/dL — ABNORMAL HIGH (ref 70–99)

## 2014-05-03 LAB — GLUCOSE, CAPILLARY
GLUCOSE-CAPILLARY: 76 mg/dL (ref 70–99)
Glucose-Capillary: 170 mg/dL — ABNORMAL HIGH (ref 70–99)

## 2014-05-04 LAB — GLUCOSE, CAPILLARY
GLUCOSE-CAPILLARY: 231 mg/dL — AB (ref 70–99)
Glucose-Capillary: 113 mg/dL — ABNORMAL HIGH (ref 70–99)

## 2014-05-05 LAB — GLUCOSE, CAPILLARY
GLUCOSE-CAPILLARY: 211 mg/dL — AB (ref 70–99)
GLUCOSE-CAPILLARY: 132 mg/dL — AB (ref 70–99)
Glucose-Capillary: 94 mg/dL (ref 70–99)

## 2014-05-06 LAB — GLUCOSE, CAPILLARY
GLUCOSE-CAPILLARY: 144 mg/dL — AB (ref 70–99)
GLUCOSE-CAPILLARY: 202 mg/dL — AB (ref 70–99)
Glucose-Capillary: 108 mg/dL — ABNORMAL HIGH (ref 70–99)
Glucose-Capillary: 104 mg/dL — ABNORMAL HIGH (ref 70–99)

## 2014-05-07 LAB — GLUCOSE, CAPILLARY
GLUCOSE-CAPILLARY: 80 mg/dL (ref 70–99)
GLUCOSE-CAPILLARY: 142 mg/dL — AB (ref 70–99)
GLUCOSE-CAPILLARY: 144 mg/dL — AB (ref 70–99)
Glucose-Capillary: 172 mg/dL — ABNORMAL HIGH (ref 70–99)

## 2014-05-08 LAB — GLUCOSE, CAPILLARY
GLUCOSE-CAPILLARY: 210 mg/dL — AB (ref 70–99)
Glucose-Capillary: 140 mg/dL — ABNORMAL HIGH (ref 70–99)

## 2014-05-09 LAB — GLUCOSE, CAPILLARY
GLUCOSE-CAPILLARY: 78 mg/dL (ref 70–99)
Glucose-Capillary: 102 mg/dL — ABNORMAL HIGH (ref 70–99)
Glucose-Capillary: 239 mg/dL — ABNORMAL HIGH (ref 70–99)

## 2014-05-10 LAB — GLUCOSE, CAPILLARY
GLUCOSE-CAPILLARY: 100 mg/dL — AB (ref 70–99)
Glucose-Capillary: 106 mg/dL — ABNORMAL HIGH (ref 70–99)
Glucose-Capillary: 220 mg/dL — ABNORMAL HIGH (ref 70–99)
Glucose-Capillary: 182 mg/dL — ABNORMAL HIGH (ref 70–99)

## 2014-05-11 ENCOUNTER — Encounter (HOSPITAL_COMMUNITY)
Admission: RE | Admit: 2014-05-11 | Discharge: 2014-05-11 | Disposition: A | Discharge status: Home / Self Care | Payer: PPO | Source: Skilled Nursing Facility | Attending: Internal Medicine | Admitting: Internal Medicine

## 2014-05-11 DIAGNOSIS — E119 Type 2 diabetes mellitus without complications: Secondary | ICD-10-CM | POA: Insufficient documentation

## 2014-05-11 DIAGNOSIS — Z794 Long term (current) use of insulin: Secondary | ICD-10-CM | POA: Insufficient documentation

## 2014-05-11 LAB — GLUCOSE, CAPILLARY: Glucose-Capillary: 94 mg/dL (ref 70–99)

## 2014-05-12 ENCOUNTER — Other Ambulatory Visit (HOSPITAL_COMMUNITY)
Admission: RE | Admit: 2014-05-12 | Discharge: 2014-05-12 | Disposition: A | Discharge status: Home / Self Care | Payer: Medicare Other | Source: Ambulatory Visit | Attending: Internal Medicine | Admitting: Internal Medicine

## 2014-05-12 ENCOUNTER — Encounter (HOSPITAL_COMMUNITY)
Admission: RE | Admit: 2014-05-12 | Discharge: 2014-05-12 | Disposition: A | Discharge status: Home / Self Care | Payer: PPO | Source: Skilled Nursing Facility | Attending: Internal Medicine | Admitting: Internal Medicine

## 2014-05-12 DIAGNOSIS — E119 Type 2 diabetes mellitus without complications: Secondary | ICD-10-CM | POA: Diagnosis present

## 2014-05-12 DIAGNOSIS — Z794 Long term (current) use of insulin: Secondary | ICD-10-CM | POA: Diagnosis not present

## 2014-05-12 LAB — BASIC METABOLIC PANEL
Anion gap: 4 — ABNORMAL LOW (ref 5–15)
BUN: 26 mg/dL — ABNORMAL HIGH (ref 6–23)
CHLORIDE: 108 mmol/L (ref 96–112)
CO2: 27 mmol/L (ref 19–32)
CREATININE: 1.66 mg/dL — AB (ref 0.50–1.35)
Calcium: 8.9 mg/dL (ref 8.4–10.5)
GFR, EST AFRICAN AMERICAN: 47 mL/min — AB (ref >90)
GFR, EST NON AFRICAN AMERICAN: 40 mL/min — AB (ref >90)
GLUCOSE: 93 mg/dL (ref 70–99)
Potassium: 4 mmol/L (ref 3.5–5.1)
Sodium: 139 mmol/L (ref 135–145)

## 2014-05-12 LAB — GLUCOSE, CAPILLARY
Glucose-Capillary: 112 mg/dL — ABNORMAL HIGH (ref 70–99)
Glucose-Capillary: 217 mg/dL — ABNORMAL HIGH (ref 70–99)
Glucose-Capillary: 96 mg/dL (ref 70–99)

## 2014-05-13 DIAGNOSIS — E119 Type 2 diabetes mellitus without complications: Secondary | ICD-10-CM | POA: Diagnosis not present

## 2014-05-13 LAB — GLUCOSE, CAPILLARY
GLUCOSE-CAPILLARY: 131 mg/dL — AB (ref 70–99)
Glucose-Capillary: 106 mg/dL — ABNORMAL HIGH (ref 70–99)
Glucose-Capillary: 103 mg/dL — ABNORMAL HIGH (ref 70–99)

## 2014-05-14 LAB — GLUCOSE, CAPILLARY
GLUCOSE-CAPILLARY: 98 mg/dL (ref 70–99)
GLUCOSE-CAPILLARY: 222 mg/dL — AB (ref 70–99)
Glucose-Capillary: 81 mg/dL (ref 70–99)
Glucose-Capillary: 109 mg/dL — ABNORMAL HIGH (ref 70–99)
Glucose-Capillary: 112 mg/dL — ABNORMAL HIGH (ref 70–99)
Glucose-Capillary: 132 mg/dL — ABNORMAL HIGH (ref 70–99)

## 2014-05-15 LAB — GLUCOSE, CAPILLARY
GLUCOSE-CAPILLARY: 136 mg/dL — AB (ref 70–99)
Glucose-Capillary: 167 mg/dL — ABNORMAL HIGH (ref 70–99)
Glucose-Capillary: 149 mg/dL — ABNORMAL HIGH (ref 70–99)
Glucose-Capillary: 147 mg/dL — ABNORMAL HIGH (ref 70–99)

## 2014-05-16 LAB — GLUCOSE, CAPILLARY
Glucose-Capillary: 80 mg/dL (ref 70–99)
Glucose-Capillary: 94 mg/dL (ref 70–99)
Glucose-Capillary: 150 mg/dL — ABNORMAL HIGH (ref 70–99)
Glucose-Capillary: 147 mg/dL — ABNORMAL HIGH (ref 70–99)

## 2014-05-17 LAB — GLUCOSE, CAPILLARY
Glucose-Capillary: 119 mg/dL — ABNORMAL HIGH (ref 70–99)
Glucose-Capillary: 171 mg/dL — ABNORMAL HIGH (ref 70–99)

## 2014-05-18 LAB — GLUCOSE, CAPILLARY
GLUCOSE-CAPILLARY: 149 mg/dL — AB (ref 70–99)
Glucose-Capillary: 144 mg/dL — ABNORMAL HIGH (ref 70–99)

## 2014-05-19 LAB — GLUCOSE, CAPILLARY
GLUCOSE-CAPILLARY: 124 mg/dL — AB (ref 70–99)
Glucose-Capillary: 131 mg/dL — ABNORMAL HIGH (ref 70–99)
Glucose-Capillary: 148 mg/dL — ABNORMAL HIGH (ref 70–99)
Glucose-Capillary: 135 mg/dL — ABNORMAL HIGH (ref 70–99)

## 2014-05-20 LAB — GLUCOSE, CAPILLARY
Glucose-Capillary: 98 mg/dL (ref 70–99)
Glucose-Capillary: 106 mg/dL — ABNORMAL HIGH (ref 70–99)
Glucose-Capillary: 119 mg/dL — ABNORMAL HIGH (ref 70–99)
Glucose-Capillary: 210 mg/dL — ABNORMAL HIGH (ref 70–99)

## 2014-05-21 ENCOUNTER — Non-Acute Institutional Stay (SKILLED_NURSING_FACILITY): Payer: PPO | Admitting: Internal Medicine

## 2014-05-21 DIAGNOSIS — E119 Type 2 diabetes mellitus without complications: Secondary | ICD-10-CM

## 2014-05-21 DIAGNOSIS — Z794 Long term (current) use of insulin: Secondary | ICD-10-CM

## 2014-05-21 DIAGNOSIS — IMO0001 Reserved for inherently not codable concepts without codable children: Secondary | ICD-10-CM

## 2014-05-21 DIAGNOSIS — E871 Hypo-osmolality and hyponatremia: Secondary | ICD-10-CM

## 2014-05-21 DIAGNOSIS — N179 Acute kidney failure, unspecified: Secondary | ICD-10-CM

## 2014-05-21 DIAGNOSIS — R339 Retention of urine, unspecified: Secondary | ICD-10-CM | POA: Insufficient documentation

## 2014-05-21 DIAGNOSIS — I10 Essential (primary) hypertension: Secondary | ICD-10-CM

## 2014-05-21 LAB — GLUCOSE, CAPILLARY
GLUCOSE-CAPILLARY: 81 mg/dL (ref 70–99)
Glucose-Capillary: 164 mg/dL — ABNORMAL HIGH (ref 70–99)
Glucose-Capillary: 199 mg/dL — ABNORMAL HIGH (ref 70–99)

## 2014-05-21 NOTE — Progress Notes (Signed)
Patient ID: Albert Garrison, male   DOB: 07-26-1945, 69 y.o.   MRN: [id number]   this is a discharge note Facility; [facility] Chief complaint; admission to SNF post admit to [facility] from 12/21 to 12/25  HPI--; this is a 69 year old man who presented to the emergency room with chills, nausea and vomiting with some nonspecific abdominal discomfort. He had no diarrhea. He was felt to have a UTI and noted to have significant hyponatremia with a sodium of 105. I note that his sodium was 129 in October 2015, at which time his creatinine was 1.57. He was noted to be in acute renal failure on presentation with a sodium of 105.  This was corrected- his sodium gradually improved to 128. He was seen by nephrology. Further studies revealed a urine osmolality of 160 a serum osmolality of 236 repeated at 190 and 264 respectively. His urine sodium was 29. TSH at 4.872. PTH at 22 random cortisol was within the normal range. His acute renal failure was felt to be secondary to obstructive uropathy, Bactrim, NSAIDs and hypovolemia. It is recommended that he see urology as an outpatient for voiding trials he did do this last month-it was noted by Dr. Jeffie Pollock  there was thought he had retention with a history of persistent Serratia infection-- he did not have significant obstruction by cystoscopy felt possibly to have a hypotonic bladder-he is on trimethoprim prophylactically-apparently urodynamics studies are pending as an outpatient.--He is also on Flomax  He continues with a catheter will need this for the time being. He also has a history of diabetes with renal insufficiency-he is on Levemir which has been increased to 12 units red sugars in the morning appear to run from the 80s-low 100s--at noon appears to run from the 90s to lower 100s generally.  This also appears to be the case 4 PM.  At at bedtime appears to average more in the mid 100s   He did have labs done approximately a week ago which appeared be  unremarkable with a sodium of 139 potassium 4 BUN of 26 and creatinine of 1.66.  February 1 T did have a hemoglobin done at 11.1 which is relatively baseline white count was 6 platelets 220 at that time sodium also was 139 creatinine was 1.74  He does have a listed history of hypertension -- note per chart review he has frequent systolic readings in the [id number] did take it manually after he'd been walking extensively throughout the facility and actually got a systolic of 734 diastolic of [id number] resting for a few minutes this did come down to 170/84.--He is not on any medications currently for hypertension  He did not complain of any dizziness headache syncopal-type feelings.    He continues to ambulate about the facility with his walker appears to be doing very well he tells me he will be going with his aunt he does have a complicated social situation apparently his house was foreclosed on he does have a very supportive aunt--apparently he will be going to an apartment at some point   Past Medical History  Diagnosis Date  . Hypertension     Past Surgical History  Procedure Laterality Date  . Cervical spine surgery  2010  . Knee arthroscopy    . Carpal tunnel release    . Lumbar laminectomy      Medications reviewed per MAR.  Vitamin D3 1000 units every morning.  Flomax 0.4 mg daily at bedtime.  Levemir 12 units daily  at bedtime.  Trimethoprim 100 mg daily at bedtime.  Tylenol 650 mg every 6 hours when necessary.  He is also on sliding scale NovoLog insulin   Medication Sig Dispense Refill                  10 mL 11     10 mL 11     120 each       Social; p . States he is independent with ADLs and IADLs still driving. Smoking 40 years ago. No excessive alcohol use. No ambulatory assist devices. He was not on insulin prior to this admission states he was on metformin at one time that made him sick.  reports that he has quit smoking. He has never used smokeless  tobacco. He reports that he drinks about 1.8 oz of alcohol per week. He reports that he does not use illicit drugs.  Family history; none of relevance obtained  Review of systems Gen. no complaints of fever or chills says he feels well.  Skin does not complain of rashes or itching; HEENT; no headache Respiratory no cough no sputum no hemoptysis Cardiac no chest pain GI no nausea vomiting or diarrhea GU he denies prior voiding difficulties now has a Foley catheter Gait; not completely sure about the history here he states he "walked into the hospital"--does use a walker appears to be doing well with this  Physical examination Temperature 97.9 pulse 62 respirations 20 blood pressures as noted above most recently 170/84 Gen. patient is in no distress alert and appears to be able to talk some about his own history. His skin is warm and dry HEENT; pupils equal and reactive, slight lid lag noted. Mucous membranes are moist  Vascular; has a pulsatile area just below the lateral aspect of the clavicle.-- suspect this may be a subclavian artery aneurysm per Dr. Janalyn Rouse assessment Respiratory; clear air entry bilaterally no crackles or wheezes. No clubbing is noted Cardiac; euvolemic heart sounds are normal no gallops no significant edema Abdomen no masses no liver no spleen no tenderness all sounds are positive GU bladder is not distended no CVA tenderness. Has a Foley catheter in place running amber colored urine Extremities; muscle wasting in the quads but no fasciculations--appears to ambulate well with a walker  Labs.  05/12/2014.  Sodium 139 potassium 4 BUN 26 creatinine 1.66.  05/05/2014.  WBC 6.0 hemoglobin 11.1 platelets 220   Impression/plan #1 extreme hyponatremia; felt to be secondary to hypovolemia. I'm not sure how this was replaced. Nonetheless this appears to have stabilized and normalized at 139 clinically appears to be doing quite well. #2 acute on chronic renal  failure secondary to hypovolemia, SECONDARY to urinary retention Bactrim-this appears to be back at baseline with a creatinine of 1.66 BUN of 26. #3 obstructive uropathy.--As noted above he is followed by urology he is now on Flomax as well as trimethoprim-urodynamic studies apparently are pending per urology follow-up-he continues with a Foley catheter  #4 type 2 diabetes; now on insulin Levemir --is appears to be stable although does have some readings in the 80s especially the morning will back off a couple units although essentially this appears to be stable I #6 UTI with Serratia --has completed a course of Cipro   #7 metabolic encephalopathy with which resolved as his hyponatremia was corrected  #8-listed history of hypertension he is not on any medications currently-as noted above he does have significantly elevated systolics at times-will start Norvasc 5 mg daily and monitor  appears blood pressure tends to go up with activity-this will need expedient follow-up by primary care provider  For now monitor blood pressures every shift notify provider if systolic greater than 371 or diastolic greater than 98  Patient appears to be doing very well ambulating quite well-clinically appears stable--blood pressure will have to be followed up on--he  Again he will be with a supportive aunt initially and then apparently will transition to an apartment situation.  [id number] note greater than 30 minutes spent on this discharge summary

## 2014-05-22 LAB — GLUCOSE, CAPILLARY: Glucose-Capillary: 80 mg/dL (ref 70–99)

## 2014-05-23 DIAGNOSIS — G20A1 Parkinson's disease without dyskinesia, without mention of fluctuations: Secondary | ICD-10-CM | POA: Insufficient documentation

## 2014-07-15 ENCOUNTER — Other Ambulatory Visit: Payer: Self-pay | Admitting: Urology

## 2014-08-06 ENCOUNTER — Encounter (HOSPITAL_BASED_OUTPATIENT_CLINIC_OR_DEPARTMENT_OTHER): Payer: Self-pay | Admitting: *Deleted

## 2014-08-06 NOTE — Progress Notes (Signed)
To [facility] at [place] on arrival,Ekg,Cxr with chart.Instructed Npo after MN-will bring medications to better verify-instructed not to take any meds that am.Plans to go home with family member post op.

## 2014-08-12 ENCOUNTER — Ambulatory Visit (HOSPITAL_BASED_OUTPATIENT_CLINIC_OR_DEPARTMENT_OTHER): Admission: RE | Admit: 2014-08-12 | Payer: PPO | Source: Ambulatory Visit | Admitting: Urology

## 2014-08-12 HISTORY — DX: Hypo-osmolality and hyponatremia: E87.1

## 2014-08-12 HISTORY — DX: Presence of urogenital implants: Z96.0

## 2014-08-12 HISTORY — DX: Urinary tract infection, site not specified: N39.0

## 2014-08-12 HISTORY — DX: Type 2 diabetes mellitus without complications: E11.9

## 2014-08-12 SURGERY — INCISION, PROSTATE, TRANSURETHRAL
Anesthesia: Choice

## 2014-12-18 ENCOUNTER — Other Ambulatory Visit: Payer: Self-pay | Admitting: Urology

## 2015-01-12 NOTE — Patient Instructions (Addendum)
Albert Garrison  01/12/2015   Your procedure is scheduled on: January 20, 2015  Report to [facility]  Entrance take [place]  elevators to 3rd floor to  [place] at  5:30 AM.  Call this number if you have problems the morning of surgery [phone]   Remember: ONLY 1 PERSON MAY GO WITH YOU TO SHORT STAY TO GET  READY MORNING OF Albert Garrison.  Do not eat food or drink liquids :After Midnight.             Take Bactrim-Septra as directed by physician            Bring catheter supplies with you morning of surgery            Take these medicines the morning of surgery with A SIP OF WATER: Amlodipine (norvasc), DO NOT TAKE ANY DIABETIC MEDICATIONS DAY OF YOUR SURGERY                               You may not have any metal on your body including hair pins and              piercings  Do not wear jewelry, lotions, powders or perfumes, deodorant                         Men may shave face and neck.   Do not bring valuables to the hospital. Albert Garrison.  Contacts, dentures or bridgework may not be worn into surgery.  Leave suitcase in the car. After surgery it may be brought to your room.       Special Instructions: coughing and deep breathing exercises, leg exercises              Please read over the following fact sheets you were given: _____________________________________________________________________             [name] - Preparing for Surgery Before surgery, you can play an important role.  Because skin is not sterile, your skin needs to be as free of germs as possible.  You can reduce the number of germs on your skin by washing with CHG (chlorahexidine gluconate) soap before surgery.  CHG is an antiseptic cleaner which kills germs and bonds with the skin to continue killing germs even after washing. Please DO NOT use if you have an allergy to CHG or antibacterial soaps.  If your skin  becomes reddened/irritated stop using the CHG and inform your nurse when you arrive at Short Stay. Do not shave (including legs and underarms) for at least 48 hours prior to the first CHG shower.  You may shave your face/neck. Please follow these instructions carefully:  1.  Shower with CHG Soap the night before surgery and the  morning of Surgery.  2.  If you choose to wash your hair, wash your hair first as usual with your  normal  shampoo.  3.  After you shampoo, rinse your hair and body thoroughly to remove the  shampoo.                           4.  Use CHG as you would any  other liquid soap.  You can apply chg directly  to the skin and wash                       Gently with a scrungie or clean washcloth.  5.  Apply the CHG Soap to your body ONLY FROM THE NECK DOWN.   Do not use on face/ open                           Wound or open sores. Avoid contact with eyes, ears mouth and genitals (private parts).                       Wash face,  Genitals (private parts) with your normal soap.             6.  Wash thoroughly, paying special attention to the area where your surgery  will be performed.  7.  Thoroughly rinse your body with warm water from the neck down.  8.  DO NOT shower/wash with your normal soap after using and rinsing off  the CHG Soap.                9.  Pat yourself dry with a clean towel.            10.  Wear clean pajamas.            11.  Place clean sheets on your bed the night of your first shower and do not  sleep with pets. Day of Surgery : Do not apply any lotions/deodorants the morning of surgery.  Please wear clean clothes to the hospital/surgery center.  FAILURE TO FOLLOW THESE INSTRUCTIONS MAY RESULT IN THE CANCELLATION OF YOUR SURGERY PATIENT SIGNATURE_________________________________  NURSE SIGNATURE__________________________________  ________________________________________________________________________

## 2015-01-15 ENCOUNTER — Encounter (HOSPITAL_COMMUNITY)
Admission: RE | Admit: 2015-01-15 | Discharge: 2015-01-15 | Disposition: A | Discharge status: Home / Self Care | Payer: PPO | Source: Ambulatory Visit | Attending: Urology | Admitting: Urology

## 2015-01-15 ENCOUNTER — Encounter (HOSPITAL_COMMUNITY): Payer: Self-pay

## 2015-01-15 DIAGNOSIS — N400 Enlarged prostate without lower urinary tract symptoms: Secondary | ICD-10-CM | POA: Insufficient documentation

## 2015-01-15 DIAGNOSIS — Z01818 Encounter for other preprocedural examination: Secondary | ICD-10-CM | POA: Diagnosis present

## 2015-01-15 HISTORY — DX: Depression, unspecified: F32.A

## 2015-01-15 HISTORY — DX: Unspecified osteoarthritis, unspecified site: M19.90

## 2015-01-15 HISTORY — DX: Rheumatic fever without heart involvement: I00

## 2015-01-15 HISTORY — DX: Sleep apnea, unspecified: G47.30

## 2015-01-15 HISTORY — DX: Pneumonia, unspecified organism: J18.9

## 2015-01-15 HISTORY — DX: Gastro-esophageal reflux disease without esophagitis: K21.9

## 2015-01-15 HISTORY — DX: Major depressive disorder, single episode, unspecified: F32.9

## 2015-01-15 HISTORY — DX: Cardiac murmur, unspecified: R01.1

## 2015-01-15 LAB — BASIC METABOLIC PANEL
Anion gap: 10 (ref 5–15)
BUN: 25 mg/dL — AB (ref 6–20)
CO2: 23 mmol/L (ref 22–32)
Calcium: 9.6 mg/dL (ref 8.9–10.3)
Chloride: 104 mmol/L (ref 101–111)
Creatinine, Ser: 1.56 mg/dL — ABNORMAL HIGH (ref 0.61–1.24)
GFR calc Af Amer: 50 mL/min — ABNORMAL LOW (ref >60)
GFR, EST NON AFRICAN AMERICAN: 43 mL/min — AB (ref >60)
Glucose, Bld: 122 mg/dL — ABNORMAL HIGH (ref 65–99)
Potassium: 4.1 mmol/L (ref 3.5–5.1)
SODIUM: 137 mmol/L (ref 135–145)

## 2015-01-15 LAB — CBC
HCT: 41.6 % (ref 39.0–52.0)
Hemoglobin: 14.5 g/dL (ref 13.0–17.0)
MCH: 31 pg (ref 26.0–34.0)
MCHC: 34.9 g/dL (ref 30.0–36.0)
MCV: 88.9 fL (ref 78.0–100.0)
PLATELETS: 252 10*3/uL (ref 150–400)
RBC: 4.68 MIL/uL (ref 4.22–5.81)
RDW: 12.7 % (ref 11.5–15.5)
WBC: 7.9 10*3/uL (ref 4.0–10.5)

## 2015-01-15 NOTE — Progress Notes (Signed)
01-15-15 - BMP lab result from preop appt. On 01-15-15 faxed to Dr. Irine Seal via [facility]

## 2015-01-15 NOTE — Progress Notes (Signed)
05-21-14 - [facility] - A. Oscar La, [facility])- EPIC  03-24-14 - EKG - EPIC  03-24-14 - 2V CXR - EPIC

## 2015-01-15 NOTE — Progress Notes (Deleted)
12-23-14 - LOV Joelene Millin, NP (Geriatric Med.) - EPIC 12-23-14 - CBC - EPIC 12-23-14 - UA/Micro - EPIC 10-09-14 - MRI ABD - EPIC 07-31-14 - HgA1C (7.5) - EPIC  09-20-13 - Echo- EPIC

## 2015-01-19 NOTE — H&P (Signed)
Active Problems Problems  1. Chronic cystitis (N30.20) 2. Hypertension (I10) 3. Nodular prostate without lower urinary tract symptoms (N40.2) 4. Urinary retention (R33.9)  History of Present Illness [name] returns today in f/u. He was supposed to have a TUIP back in May for retention but got shingles and cancelled and has not had the foley changed since urodynamics in April that showed a 441ml capacity bladder with mild instability. He had a well sustained detrusor contraction but was unable to void and the fluoroscopy was consistent with obstruction.  He didn't have visible obstruction on cystoscopy but functionally he is obstructed.   Past Medical History Problems  1. History of Anxiety (F41.9) 2. History of arthritis (Z87.39) 3. History of diabetes mellitus (Z86.39) 4. History of herpes zoster (Z86.19)  Surgical History Problems  1. History of Arthroscopy Knee Right 2. History of Laminectomy Lumbar 3. History of Neck Repair 4. History of Neuroplasty Median Nerve At Carpal Tunnel  Current Meds 1. AmLODIPine Besylate 5 MG Oral Tablet; TAKE 1 TABLET DAILY AS DIRECTED   Requested for: 08Apr2016; Last Rx:08Apr2016 Ordered 2. Ibuprofen 200 MG Oral Tablet;  Therapy: (Recorded:11Jan2016) to Recorded 3. Levemir 100 UNIT/ML Subcutaneous Solution;  Therapy: (Recorded:11Jan2016) to Recorded 4. NovoLOG 100 UNIT/ML Subcutaneous Solution;  Therapy: (Recorded:11Jan2016) to Recorded  Allergies Medication  1. No Known Drug Allergies  Family History Problems  1. Family history of atrial fibrillation (Z82.49) : Sister 2. Family history of diabetes mellitus (Z83.3) : Sister 3. Family history of thyroid disease (Z83.49) : Sister 4. No pertinent family history : Mother  Social History Problems  1. Alcohol use (Z78.9)   1 per week 2. Caffeine use (F15.90)   2-3 per day 3. Divorced 4. Father deceased   age 56 heart attack 5. Former smoker [id number] 6. Mother deceased   age  66 Parkinson disease 7. Number of children   3 boys & 1 girl 8. Occupation   Retired  Electronics engineer, constitutional, skin, eye, otolaryngeal, hematologic/lymphatic, cardiovascular, pulmonary, endocrine, musculoskeletal, gastrointestinal, neurological and psychiatric system(s) were reviewed and pertinent findings if present are noted and are otherwise negative.  Genitourinary: (has foley).    Vitals Vital Signs [Data Includes: Last 1 Day]  Recorded: 14Sep2016 04:36PM  Blood Pressure: 177 / 94 Temperature: 98 F Heart Rate: 70  Physical Exam Constitutional: Well nourished and well developed . No acute distress.  Pulmonary: No respiratory distress and normal respiratory rhythm and effort.  Cardiovascular: Heart rate and rhythm are normal . No peripheral edema.  Abdomen: The abdomen is soft and nontender.    Results/Data Urine [Data Includes: Last 1 Day]   14Sep2016  COLOR YELLOW   APPEARANCE CLOUDY   SPECIFIC GRAVITY 1.020   pH 5.5   GLUCOSE NEGATIVE   BILIRUBIN NEGATIVE   KETONE NEGATIVE   BLOOD 1+   PROTEIN NEGATIVE   NITRITE POSITIVE   LEUKOCYTE ESTERASE 2+   SQUAMOUS EPITHELIAL/HPF 0-5 HPF  WBC 40-60 WBC/HPF  RBC 10-20 RBC/HPF  BACTERIA MANY HPF  CRYSTALS NONE SEEN HPF  CASTS NONE SEEN LPF  Yeast NONE SEEN HPF   Procedure The foley was replaced with a fresh 78fr catheter with a leg bag.     Assessment Assessed  1. Urinary retention (R33.9) 2. Chronic cystitis (N30.20)  He has retention and was to have a TUIP but got sidelined but the shingles.   Plan  Health Maintenance  1. UA With REFLEX; [Do Not Release]; Status:Resulted - Requires Verification;   Done:  [id number]  04:11PM Urinary retention  2. Cath, simple, wIinsert Temp Cath; Status:Hold For - Appointment,Date of Service;  Requested for:14Sep2016;  3. Follow-up Schedule Surgery Office  Follow-up  Status: Hold For - Appointment   Requested for: 14Sep2016  His foley was changed to  day and a culture was sent from the new catheter.  I will reschedule him for the TUIP. We have reviewed the risks previously.   Discussion/Summary CC: Dr. Wende Neighbors.

## 2015-01-20 ENCOUNTER — Ambulatory Visit (HOSPITAL_COMMUNITY): Payer: PPO | Admitting: Anesthesiology

## 2015-01-20 ENCOUNTER — Observation Stay (HOSPITAL_COMMUNITY)
Admission: RE | Admit: 2015-01-20 | Discharge: 2015-01-21 | Disposition: A | Discharge status: Home / Self Care | Payer: PPO | Source: Ambulatory Visit | Attending: Urology | Admitting: Urology

## 2015-01-20 ENCOUNTER — Encounter (HOSPITAL_COMMUNITY): Payer: Self-pay | Admitting: *Deleted

## 2015-01-20 ENCOUNTER — Encounter (HOSPITAL_COMMUNITY)
Admission: RE | Disposition: A | Discharge status: Home / Self Care | Payer: Self-pay | Source: Ambulatory Visit | Attending: Urology

## 2015-01-20 DIAGNOSIS — N401 Enlarged prostate with lower urinary tract symptoms: Secondary | ICD-10-CM | POA: Diagnosis not present

## 2015-01-20 DIAGNOSIS — Z87891 Personal history of nicotine dependence: Secondary | ICD-10-CM | POA: Insufficient documentation

## 2015-01-20 DIAGNOSIS — D291 Benign neoplasm of prostate: Secondary | ICD-10-CM | POA: Diagnosis present

## 2015-01-20 DIAGNOSIS — Z794 Long term (current) use of insulin: Secondary | ICD-10-CM | POA: Insufficient documentation

## 2015-01-20 DIAGNOSIS — N138 Other obstructive and reflux uropathy: Secondary | ICD-10-CM | POA: Diagnosis present

## 2015-01-20 DIAGNOSIS — R338 Other retention of urine: Secondary | ICD-10-CM | POA: Insufficient documentation

## 2015-01-20 DIAGNOSIS — E119 Type 2 diabetes mellitus without complications: Secondary | ICD-10-CM | POA: Insufficient documentation

## 2015-01-20 DIAGNOSIS — I10 Essential (primary) hypertension: Secondary | ICD-10-CM | POA: Insufficient documentation

## 2015-01-20 HISTORY — PX: TRANSURETHRAL INCISION OF PROSTATE: SHX2573

## 2015-01-20 LAB — GLUCOSE, CAPILLARY
Glucose-Capillary: 127 mg/dL — ABNORMAL HIGH (ref 65–99)
Glucose-Capillary: 129 mg/dL — ABNORMAL HIGH (ref 65–99)
Glucose-Capillary: 106 mg/dL — ABNORMAL HIGH (ref 65–99)
Glucose-Capillary: 92 mg/dL (ref 65–99)
Glucose-Capillary: 96 mg/dL (ref 65–99)

## 2015-01-20 SURGERY — INCISION, PROSTATE, TRANSURETHRAL
Anesthesia: General

## 2015-01-20 MED ORDER — SULFAMETHOXAZOLE-TRIMETHOPRIM 400-80 MG PO TABS
1.0000 | ORAL_TABLET | Freq: Two times a day (BID) | ORAL | Status: DC
  Administered 2015-01-20 – 2015-01-21 (×3): 1 via ORAL
  Filled 2015-01-20 (×4): qty 1

## 2015-01-20 MED ORDER — HYDROMORPHONE HCL 1 MG/ML IJ SOLN
0.2500 mg | INTRAMUSCULAR | Status: DC | PRN
  Administered 2015-01-20 (×3): 0.5 mg via INTRAVENOUS

## 2015-01-20 MED ORDER — GLIPIZIDE ER 10 MG PO TB24
10.0000 mg | ORAL_TABLET | Freq: Every day | ORAL | Status: DC
  Administered 2015-01-20 – 2015-01-21 (×2): 10 mg via ORAL
  Filled 2015-01-20 (×2): qty 1

## 2015-01-20 MED ORDER — HYDROMORPHONE HCL 1 MG/ML IJ SOLN
INTRAMUSCULAR | Status: AC
  Filled 2015-01-20: qty 1

## 2015-01-20 MED ORDER — CIPROFLOXACIN IN D5W 400 MG/200ML IV SOLN
INTRAVENOUS | Status: AC
  Filled 2015-01-20: qty 200

## 2015-01-20 MED ORDER — HYDROCODONE-ACETAMINOPHEN 5-325 MG PO TABS
1.0000 | ORAL_TABLET | ORAL | Status: DC | PRN
  Administered 2015-01-21: 1 via ORAL
  Filled 2015-01-20: qty 1

## 2015-01-20 MED ORDER — DIPHENHYDRAMINE HCL 50 MG/ML IJ SOLN: 12.5000 mg | Freq: Four times a day (QID) | INTRAMUSCULAR | Status: DC | PRN

## 2015-01-20 MED ORDER — CIPROFLOXACIN IN D5W 400 MG/200ML IV SOLN
400.0000 mg | INTRAVENOUS | Status: AC
  Administered 2015-01-20: 400 mg via INTRAVENOUS

## 2015-01-20 MED ORDER — ONDANSETRON HCL 4 MG/2ML IJ SOLN: 4.0000 mg | INTRAMUSCULAR | Status: DC | PRN

## 2015-01-20 MED ORDER — BISACODYL 10 MG RE SUPP: 10.0000 mg | Freq: Every day | RECTAL | Status: DC | PRN

## 2015-01-20 MED ORDER — DIPHENHYDRAMINE HCL 12.5 MG/5ML PO ELIX: 12.5000 mg | ORAL_SOLUTION | Freq: Four times a day (QID) | ORAL | Status: DC | PRN

## 2015-01-20 MED ORDER — LIDOCAINE HCL (CARDIAC) 20 MG/ML IV SOLN
INTRAVENOUS | Status: DC | PRN
  Administered 2015-01-20: 50 mg via INTRAVENOUS

## 2015-01-20 MED ORDER — HYDROMORPHONE HCL 1 MG/ML IJ SOLN: 0.5000 mg | INTRAMUSCULAR | Status: DC | PRN

## 2015-01-20 MED ORDER — PROPOFOL 10 MG/ML IV BOLUS
INTRAVENOUS | Status: DC | PRN
  Administered 2015-01-20: 20 mg via INTRAVENOUS
  Administered 2015-01-20: 150 mg via INTRAVENOUS

## 2015-01-20 MED ORDER — PROPOFOL 10 MG/ML IV BOLUS
INTRAVENOUS | Status: AC
  Filled 2015-01-20: qty 20

## 2015-01-20 MED ORDER — POTASSIUM CHLORIDE IN NACL 20-0.45 MEQ/L-% IV SOLN
INTRAVENOUS | Status: DC
  Administered 2015-01-20 – 2015-01-21 (×3): via INTRAVENOUS
  Filled 2015-01-20 (×4): qty 1000

## 2015-01-20 MED ORDER — ZOLPIDEM TARTRATE 5 MG PO TABS: 5.0000 mg | ORAL_TABLET | Freq: Every evening | ORAL | Status: DC | PRN

## 2015-01-20 MED ORDER — SODIUM CHLORIDE 0.9 % IR SOLN
3000.0000 mL | Status: DC
  Administered 2015-01-20: 3000 mL

## 2015-01-20 MED ORDER — DOCUSATE SODIUM 100 MG PO CAPS
100.0000 mg | ORAL_CAPSULE | Freq: Two times a day (BID) | ORAL | Status: DC
  Administered 2015-01-20 – 2015-01-21 (×2): 100 mg via ORAL
  Filled 2015-01-20 (×2): qty 1

## 2015-01-20 MED ORDER — SODIUM CHLORIDE 0.9 % IJ SOLN
INTRAMUSCULAR | Status: AC
  Filled 2015-01-20: qty 10

## 2015-01-20 MED ORDER — EPHEDRINE SULFATE 50 MG/ML IJ SOLN
INTRAMUSCULAR | Status: AC
  Filled 2015-01-20: qty 1

## 2015-01-20 MED ORDER — ACETAMINOPHEN 325 MG PO TABS: 650.0000 mg | ORAL_TABLET | ORAL | Status: DC | PRN

## 2015-01-20 MED ORDER — SENNOSIDES-DOCUSATE SODIUM 8.6-50 MG PO TABS: 1.0000 | ORAL_TABLET | Freq: Every evening | ORAL | Status: DC | PRN

## 2015-01-20 MED ORDER — SODIUM CHLORIDE 0.9 % IR SOLN
Status: DC | PRN
  Administered 2015-01-20: 12000 mL

## 2015-01-20 MED ORDER — HYOSCYAMINE SULFATE 0.125 MG SL SUBL
0.1250 mg | SUBLINGUAL_TABLET | SUBLINGUAL | Status: DC | PRN
  Filled 2015-01-20: qty 1

## 2015-01-20 MED ORDER — FENTANYL CITRATE (PF) 250 MCG/5ML IJ SOLN
INTRAMUSCULAR | Status: AC
  Filled 2015-01-20: qty 25

## 2015-01-20 MED ORDER — ONDANSETRON HCL 4 MG/2ML IJ SOLN
INTRAMUSCULAR | Status: AC
  Filled 2015-01-20: qty 2

## 2015-01-20 MED ORDER — PROMETHAZINE HCL 25 MG/ML IJ SOLN: 6.2500 mg | INTRAMUSCULAR | Status: DC | PRN

## 2015-01-20 MED ORDER — INSULIN ASPART 100 UNIT/ML ~~LOC~~ SOLN: 0.0000 [IU] | Freq: Three times a day (TID) | SUBCUTANEOUS | Status: DC

## 2015-01-20 MED ORDER — FENTANYL CITRATE (PF) 100 MCG/2ML IJ SOLN
INTRAMUSCULAR | Status: DC | PRN
  Administered 2015-01-20: 50 ug via INTRAVENOUS
  Administered 2015-01-20: 25 ug via INTRAVENOUS
  Administered 2015-01-20: 50 ug via INTRAVENOUS

## 2015-01-20 MED ORDER — LIDOCAINE HCL (CARDIAC) 20 MG/ML IV SOLN
INTRAVENOUS | Status: AC
  Filled 2015-01-20: qty 5

## 2015-01-20 MED ORDER — ONDANSETRON HCL 4 MG/2ML IJ SOLN
INTRAMUSCULAR | Status: DC | PRN
  Administered 2015-01-20: 4 mg via INTRAVENOUS

## 2015-01-20 MED ORDER — LACTATED RINGERS IV SOLN
INTRAVENOUS | Status: DC | PRN
  Administered 2015-01-20: 07:00:00 via INTRAVENOUS

## 2015-01-20 MED ORDER — AMLODIPINE BESYLATE 10 MG PO TABS: 10.0000 mg | ORAL_TABLET | Freq: Every day | ORAL | Status: DC

## 2015-01-20 SURGICAL SUPPLY — 17 items
BAG URINE DRAINAGE (UROLOGICAL SUPPLIES) IMPLANT
BAG URO CATCHER STRL LF (DRAPE) ×2 IMPLANT
CATH FOLEY 3WAY 30CC 22FR (CATHETERS) IMPLANT
CATH URET 5FR 28IN OPEN ENDED (CATHETERS) IMPLANT
ELECT REM PT RETURN 9FT ADLT (ELECTROSURGICAL)
ELECTRODE REM PT RTRN 9FT ADLT (ELECTROSURGICAL) ×1 IMPLANT
EVACUATOR MICROVAS BLADDER (UROLOGICAL SUPPLIES) ×2 IMPLANT
GLOVE SURG SS PI 8.0 STRL IVOR (GLOVE) ×1 IMPLANT
GOWN STRL REUS W/TWL XL LVL3 (GOWN DISPOSABLE) ×2 IMPLANT
HOLDER FOLEY CATH W/STRAP (MISCELLANEOUS) ×1 IMPLANT
KIT ASPIRATION TUBING (SET/KITS/TRAYS/PACK) ×2 IMPLANT
LOOP CUT BIPOLAR 24F LRG (ELECTROSURGICAL) ×1 IMPLANT
MANIFOLD NEPTUNE II (INSTRUMENTS) ×2 IMPLANT
PACK CYSTO (CUSTOM PROCEDURE TRAY) ×2 IMPLANT
SYR 30ML LL (SYRINGE) IMPLANT
SYRINGE IRR TOOMEY STRL 70CC (SYRINGE) ×1 IMPLANT
TUBING CONNECTING 10 (TUBING) ×2 IMPLANT

## 2015-01-20 NOTE — Transfer of Care (Signed)
Immediate Anesthesia Transfer of Care Note  Patient: Albert Garrison  Procedure(s) Performed: Procedure(s): TRANSURETHRAL INCISION OF THE PROSTATE (TUIP) (N/A)  Patient Location: PACU  Anesthesia Type:General  Level of Consciousness: awake, alert  and oriented  Airway & Oxygen Therapy: Patient Spontanous Breathing and Patient connected to face mask oxygen  Post-op Assessment: Report given to RN and Post -op Vital signs reviewed and stable  Post vital signs: Reviewed and stable  Last Vitals:  Filed Vitals:   01/20/15 0534  BP: 142/71  Pulse: 62  Temp: 36.3 C  Resp: 16    Complications: No apparent anesthesia complications

## 2015-01-20 NOTE — Progress Notes (Signed)
Second EKG done

## 2015-01-20 NOTE — OR Nursing (Signed)
Pt had two episodes lasting less than 60 seconds of a junctional rhythm with depressed ST segments. See Strips. Asymptomatic.  Dr Oletta Lamas aware will see pt. EKG obtained per Dr Marcell Barlow.

## 2015-01-20 NOTE — Anesthesia Procedure Notes (Signed)
Procedure Name: LMA Insertion Date/Time: 01/20/2015 7:29 AM Performed by: Glory Buff Pre-anesthesia Checklist: Patient identified, Emergency Drugs available, Suction available and Patient being monitored Patient Re-evaluated:Patient Re-evaluated prior to inductionOxygen Delivery Method: Circle system utilized Preoxygenation: Pre-oxygenation with 100% oxygen Intubation Type: IV induction LMA: LMA inserted LMA Size: 4.0 Number of attempts: 1 Placement Confirmation: positive ETCO2 Tube secured with: Tape

## 2015-01-20 NOTE — Brief Op Note (Signed)
01/20/2015  8:08 AM  PATIENT:  [name]  69 y.o. male  PRE-OPERATIVE DIAGNOSIS:  BPH WITH BLADDER OUTLET OBSTRUCTION   POST-OPERATIVE DIAGNOSIS:  BPH WITH BLADDER OUTLET OBSTRUCTION   PROCEDURE:  Procedure(s): TRANSURETHRAL RESECTION OF THE PROSTATE (TUIP) (N/A)  SURGEON:  Surgeon(s) and Role:    * Irine Seal, MD - Primary  PHYSICIAN ASSISTANT:   ASSISTANTS: none   ANESTHESIA:   general  EBL:     BLOOD ADMINISTERED:none  DRAINS: Urinary Catheter (Foley)   LOCAL MEDICATIONS USED:  NONE  SPECIMEN:  Source of Specimen:  Prostate chips  DISPOSITION OF SPECIMEN:  PATHOLOGY  COUNTS:  YES  TOURNIQUET:  * No tourniquets in log *  DICTATION: .Other Dictation: Dictation Number [id number]  PLAN OF CARE: Admit for overnight observation  PATIENT DISPOSITION:  PACU - hemodynamically stable.   Delay start of Pharmacological VTE agent (>24hrs) due to surgical blood loss or risk of bleeding: yes

## 2015-01-20 NOTE — Anesthesia Postprocedure Evaluation (Signed)
  Anesthesia Post-op Note  Patient: Albert Garrison  Procedure(s) Performed: Procedure(s): TRANSURETHRAL INCISION OF THE PROSTATE (TUIP) (N/A)  Patient Location: PACU  Anesthesia Type:General  Level of Consciousness: awake  Airway and Oxygen Therapy: Patient Spontanous Breathing  Post-op Pain: mild  Post-op Assessment: Post-op Vital signs reviewed              Post-op Vital Signs: Reviewed  Last Vitals:  Filed Vitals:   01/20/15 0850  BP:   Pulse: 55  Temp:   Resp: 13    Complications: No apparent anesthesia complications

## 2015-01-20 NOTE — Op Note (Signed)
NAMEMarland Kitchen  Albert Garrison, Albert Garrison               ACCOUNT NO.:  [id number]  MEDICAL RECORD NO.:  [id number]  LOCATION:  [facility]                         FACILITY:  [facility]  PHYSICIAN:  Marshall Cork. Jeffie Pollock, M.D.    DATE OF BIRTH:  Sep 11, 1945  DATE OF PROCEDURE:  01/20/2015 DATE OF DISCHARGE:                              OPERATIVE REPORT   PROCEDURE:  Transurethral resection of prostate.  PREOPERATIVE DIAGNOSIS:  Benign prostatic hyperplasia, bladder outlet obstruction.  POSTOPERATIVE DIAGNOSIS:  Benign prostatic hyperplasia, bladder outlet obstruction.  SURGEON:  Marshall Cork. Jeffie Pollock, M.D.  ANESTHESIA:  General.  DRAINS:  A 22-French 3-way Foley catheter.  BLOOD LOSS:  Minimal.  SPECIMEN:  Prostate chips.  COMPLICATIONS:  None.  INDICATIONS:  Albert Garrison is a 69 year old male with urinary retention. Office cystoscopy revealed what appeared to be a small prostate without significant visual obstruction.  The urodynamics demonstrated a good detrusor contraction with functional obstruction, it was felt.  Based on the cystoscopic evaluation and transurethral incision, the prostate was indicated.  The patient been placed on Bactrim 3 days prior to the procedure because of his chronic Foley since December and recent culture results.  FINDINGS AND PROCEDURE:  He was taken to the operating room where he was given Cipro.  General anesthetic was induced.  He was placed in lithotomy position.  His perineum and genitalia were prepped with Betadine solution.  He was draped in usual sterile fashion.  Cystoscopy was performed using a 22-French scope and 30-degree lens. Examination revealed a normal urethra.  The external sphincter was intact.  The prostatic urethra was approximately 2 to 3 cm in length with more lateral lobar hyperplasia obstruction and I originally noted on office cystoscopy.  There was no middle lobe.  Examination of bladder revealed moderate trabeculation.  There was diffuse changes  consistent with follicular cystitis with some catheter edema on the posterior wall. The ureteral orifices were in the normal anatomic position well away from the bladder neck.  After initial cystoscopy, a 28-French continuous flow resectoscope sheath was inserted.  This was fitted with Beatrix Fetters handle with 30- degree loop and initially a bipolar Collin's knife.  Saline was used as the irrigant.  An incision was made at 7 o'clock from just distal to the ureteral meatus out to the alongside of the verumontanum down to the prostatic capsule.  This was then repeated at 5 o'clock.  This opened the prostate up partially, but there was still some lateral lobe tissue and now some tissue on the floor that I felt.  The patient be best served by having removed.  I switched to a bipolar loop and resected the floor of the prostate from the bladder neck out to the verumontanum.  I then resected lateral lobe tissue on the left from bladder neck to apex followed by tissue on the right.  Small amount of resection on both sides required.  Once, a very adequate channel was created.  Hemostasis was achieved. The bladder was evacuated free of chips.  Final inspection revealed a widely patent prostatic fossa intact.  Ureteral orifices and external sphincters in the retained tissue or bleeding.  The scope was removed. Pressure on the bladder produced  an excellent stream.  A 22-French 3-way Foley catheter was inserted.  The balloon was filled with 30 mL sterile fluid.  The catheter had clear return and was placed to continuous irrigation and straight drainage.  The patient was taken down from lithotomy position.  His anesthetic was reversed.  He was moved to recovery in stable condition.  There were no complications.     Marshall Cork. Jeffie Pollock, M.D.     JJW/MEDQ  D:  01/20/2015  T:  01/20/2015  Job:  [id number]

## 2015-01-20 NOTE — Progress Notes (Signed)
12 Lead EKG done.

## 2015-01-20 NOTE — Anesthesia Preprocedure Evaluation (Addendum)
Anesthesia Evaluation  Patient identified by MRN, date of birth, ID band Patient awake    Reviewed: Allergy & Precautions, NPO status   Airway Mallampati: II  TM Distance: >3 FB Neck ROM: Full    Dental   Pulmonary sleep apnea , pneumonia, former smoker,    breath sounds clear to auscultation       Cardiovascular hypertension,  Rhythm:Regular Rate:Normal     Neuro/Psych    GI/Hepatic Neg liver ROS, GERD  ,  Endo/Other  diabetes  Renal/GU Renal disease     Musculoskeletal   Abdominal   Peds  Hematology   Anesthesia Other Findings   Reproductive/Obstetrics                            Anesthesia Physical Anesthesia Plan  ASA: III  Anesthesia Plan: General   Post-op Pain Management:    Induction: Intravenous  Airway Management Planned: LMA  Additional Equipment:   Intra-op Plan:   Post-operative Plan: Extubation in OR  Informed Consent: I have reviewed the patients History and Physical, chart, labs and discussed the procedure including the risks, benefits and alternatives for the proposed anesthesia with the patient or authorized representative who has indicated his/her understanding and acceptance.   Dental advisory given  Plan Discussed with: Anesthesiologist and CRNA  Anesthesia Plan Comments:        Anesthesia Quick Evaluation

## 2015-01-20 NOTE — Interval H&P Note (Signed)
History and Physical Interval Note:  01/20/2015 7:18 AM  Albert Garrison  has presented today for surgery, with the diagnosis of BPH WITH BLADDER OUTLET OBSTRUCTION   The various methods of treatment have been discussed with the patient and family. After consideration of risks, benefits and other options for treatment, the patient has consented to  Procedure(s): [place] (TUIP) (N/A) as a surgical intervention .  The patient's history has been reviewed, patient examined, no change in status, stable for surgery.  I have reviewed the patient's chart and labs.  Questions were answered to the patient's satisfaction.     [name] J

## 2015-01-20 NOTE — Progress Notes (Signed)
Dr. Oletta Lamas in to see patient- saw EKG  COPIES- Telemetry BED requested.

## 2015-01-21 DIAGNOSIS — N401 Enlarged prostate with lower urinary tract symptoms: Secondary | ICD-10-CM | POA: Diagnosis not present

## 2015-01-21 LAB — GLUCOSE, CAPILLARY: Glucose-Capillary: 99 mg/dL (ref 65–99)

## 2015-01-21 MED ORDER — DOCUSATE SODIUM 100 MG PO CAPS: 100.0000 mg | ORAL_CAPSULE | Freq: Two times a day (BID) | ORAL | Status: DC

## 2015-01-21 NOTE — Progress Notes (Signed)
All DC instructions reviewed with the pt and his aunt who he will be going home and his sister who is a retired Therapist, sports. All questions and concerns were addressed, pt is alert and oriented times 4, VSS, foley removed this am and has since been voiding clear, pink urine, no c/o pain, skin intact, no apparent s/s of distress or discomfort. Family to transport home.

## 2015-01-21 NOTE — Discharge Instructions (Signed)
Transurethral Resection of the Prostate, Care After °Refer to this sheet in the next few weeks. These instructions provide you with information on caring for yourself after your procedure. Your caregiver also may give you specific instructions. Your treatment has been planned according to current medical practices, but complications sometimes occur. Call your caregiver if you have any problems or questions after your procedure. °HOME CARE INSTRUCTIONS  °Recovery can take 4-6 weeks. Avoid alcohol, caffeinated drinks, and spicy foods for 2 weeks after your procedure. Drink enough fluids to keep your urine clear or pale yellow. Urinate as soon as you feel the urge to do so. Do not try to hold your urine for long periods of time. °During recovery you may experience pain caused by bladder spasms, which result in a very intense urge to urinate. Take all medicines as directed by your caregiver, including medicines for pain. Try to limit the amount of pain medicines you take because it can cause constipation. If you do become constipated, do not strain to move your bowels. Straining can increase bleeding. Constipation can be minimized by increasing the amount fluids and fiber in your diet. Your caregiver also may prescribe a stool softener. °Do not lift heavy objects (more than 5 lb [2.25 kg]) or perform exercises that cause you to strain for at least 1 month after your procedure. When sitting, you may want to sit in a soft chair or use a cushion. For the first 10 days after your procedure, avoid the following activities: °· Running. °· Strenuous work. °· Long walks. °· Riding in a car for extended periods. °· Sex. °SEEK MEDICAL CARE IF: °· You have difficulty urinating. °· You have blood in your urine that does not go away after you rest or increase your fluid intake. °· You have swelling in your penis or scrotum. °SEEK IMMEDIATE MEDICAL CARE IF:  °· You are suddenly unable to urinate. °· You notice blood clots in your  urine. °· You have chills. °· You have a fever. °· You have pain in your back or lower abdomen. °· You have pain or swelling in your legs. °MAKE SURE YOU:  °· Understand these instructions. °· Will watch your condition. °· Will get help right away if you are not doing well or get worse. °  °This information is not intended to replace advice given to you by your health care provider. Make sure you discuss any questions you have with your health care provider. °  °Document Released: 03/21/2005 Document Revised: 04/11/2014 Document Reviewed: 04/29/2011 °Elsevier Interactive Patient Education ©2016 [employer] ° °

## 2015-01-21 NOTE — Discharge Summary (Addendum)
Physician Discharge Summary  Patient ID: Albert Garrison MRN: [id number] DOB/AGE: [id number] 70 y.o.  Admit date: 01/20/2015 Discharge date: 01/21/2015  Admission Diagnoses:  Benign prostatic hypertrophy with urinary retention  Discharge Diagnoses:  Principal Problem:   Benign prostatic hypertrophy with urinary retention   Past Medical History  Diagnosis Date  . Hypertension   . Foley catheter in place 03/20/2014  . Diabetes mellitus without complication (HCC)     diet controlled  . Sodium (Na) deficiency     managed in nursing center12/2015-Feb-2016  . Complicated UTI (urinary tract infection) Dec.2015  . Rheumatic fever     when 4 yrs. old  . Heart murmur     hx. of at 17 yrs. old  . Sleep apnea     does not use c-pap machine  . Pneumonia     hx. of  . Depression     hx. of  . GERD (gastroesophageal reflux disease)     occasionally  . Arthritis     Surgeries: Procedure(s): TRANSURETHRAL RESECTION OF THE PROSTATE (TUIP) on 01/20/2015   Consultants (if any):    Discharged Condition: Improved  Hospital Course: Albert Garrison is an 69 y.o. male who was admitted 01/20/2015 with a diagnosis of Benign prostatic hypertrophy with urinary retention and went to the operating room on 01/20/2015 and underwent the above named procedures.  He was found to have diffuse follicular cystitis at cystoscopy and his prostate was larger than anticipated so a TURP was done instead of TUIP.  His urine is clear this morning so the foley will be removed and he will be discharged home when voiding.   He was given perioperative antibiotics:      Anti-infectives    Start     Dose/Rate Route Frequency Ordered Stop   01/20/15 1400  sulfamethoxazole-trimethoprim (BACTRIM,SEPTRA) 400-80 MG per tablet 1 tablet     1 tablet Oral 2 times daily 01/20/15 1246     01/20/15 0550  ciprofloxacin (CIPRO) IVPB 400 mg     400 mg 200 mL/hr over 60 Minutes Intravenous 60 min pre-op 01/20/15 0550  01/20/15 0755    .  He was given sequential compression devices for DVT prophylaxis.  He benefited maximally from the hospital stay and there were no complications.    Recent vital signs:  Filed Vitals:   01/21/15 0521  BP: 139/70  Pulse: 62  Temp: 98 F (36.7 C)  Resp: 19    Recent laboratory studies:  Lab Results  Component Value Date   HGB 14.5 01/15/2015   HGB 10.9* 03/28/2014   HGB 11.1* 03/26/2014   Lab Results  Component Value Date   WBC 7.9 01/15/2015   PLT 252 01/15/2015   No results found for: INR Lab Results  Component Value Date   NA 137 01/15/2015   K 4.1 01/15/2015   CL 104 01/15/2015   CO2 23 01/15/2015   BUN 25* 01/15/2015   CREATININE 1.56* 01/15/2015   GLUCOSE 122* 01/15/2015    Discharge Medications:     Medication List    STOP taking these medications        trimethoprim 100 MG tablet  Commonly known as:  TRIMPEX      TAKE these medications        amLODipine 10 MG tablet  Commonly known as:  NORVASC  Take 10 mg by mouth at bedtime.     docusate sodium 100 MG capsule  Commonly known as:  COLACE  Take 1 capsule (  100 mg total) by mouth 2 (two) times daily.     EQ NASAL SPRAY NA  Place 1 spray into the nose daily as needed (Allergies).     glipiZIDE 10 MG 24 hr tablet  Commonly known as:  GLUCOTROL XL  Take 10 mg by mouth daily.     ibuprofen 200 MG tablet  Commonly known as:  ADVIL,MOTRIN  Take 200 mg by mouth every 6 (six) hours as needed for moderate pain.     neomycin-bacitracin-polymyxin ointment  Commonly known as:  NEOSPORIN  Apply 1 application topically 2 (two) times daily as needed for wound care. apply to ey     sulfamethoxazole-trimethoprim 400-80 MG tablet  Commonly known as:  BACTRIM,SEPTRA  Take 1 tablet by mouth 2 (two) times daily. Pt to start 3 days before procedure       He had an EKG post op.  See report.    He has done well without cardiac symptoms.  Diagnostic Studies: No results  found.  Disposition: 01-Home or Self Care    Follow-up Information    Follow up with Malka So, MD.   Specialty:  Urology   Why:  as scheduled   Contact information:   [place] [address] [phone]        Signed: Malka So 01/21/2015, 6:29 AM

## 2015-04-05 DIAGNOSIS — K922 Gastrointestinal hemorrhage, unspecified: Secondary | ICD-10-CM

## 2015-04-05 HISTORY — DX: Gastrointestinal hemorrhage, unspecified: K92.2

## 2015-04-22 DIAGNOSIS — E119 Type 2 diabetes mellitus without complications: Secondary | ICD-10-CM | POA: Diagnosis not present

## 2015-04-22 DIAGNOSIS — Z125 Encounter for screening for malignant neoplasm of prostate: Secondary | ICD-10-CM | POA: Diagnosis not present

## 2015-04-24 DIAGNOSIS — E1122 Type 2 diabetes mellitus with diabetic chronic kidney disease: Secondary | ICD-10-CM | POA: Diagnosis not present

## 2015-04-24 DIAGNOSIS — E782 Mixed hyperlipidemia: Secondary | ICD-10-CM | POA: Diagnosis not present

## 2015-04-24 DIAGNOSIS — I10 Essential (primary) hypertension: Secondary | ICD-10-CM | POA: Diagnosis not present

## 2015-04-24 DIAGNOSIS — N182 Chronic kidney disease, stage 2 (mild): Secondary | ICD-10-CM | POA: Diagnosis not present

## 2015-06-05 DIAGNOSIS — N390 Urinary tract infection, site not specified: Secondary | ICD-10-CM | POA: Diagnosis not present

## 2015-09-09 DIAGNOSIS — S40262A Insect bite (nonvenomous) of left shoulder, initial encounter: Secondary | ICD-10-CM | POA: Diagnosis not present

## 2015-09-09 DIAGNOSIS — S20369A Insect bite (nonvenomous) of unspecified front wall of thorax, initial encounter: Secondary | ICD-10-CM | POA: Diagnosis not present

## 2015-09-09 DIAGNOSIS — S20469A Insect bite (nonvenomous) of unspecified back wall of thorax, initial encounter: Secondary | ICD-10-CM | POA: Diagnosis not present

## 2015-10-13 DIAGNOSIS — N182 Chronic kidney disease, stage 2 (mild): Secondary | ICD-10-CM | POA: Diagnosis not present

## 2015-10-13 DIAGNOSIS — E1122 Type 2 diabetes mellitus with diabetic chronic kidney disease: Secondary | ICD-10-CM | POA: Diagnosis not present

## 2015-10-13 DIAGNOSIS — I10 Essential (primary) hypertension: Secondary | ICD-10-CM | POA: Diagnosis not present

## 2015-10-13 DIAGNOSIS — E782 Mixed hyperlipidemia: Secondary | ICD-10-CM | POA: Diagnosis not present

## 2015-10-16 DIAGNOSIS — I10 Essential (primary) hypertension: Secondary | ICD-10-CM | POA: Diagnosis not present

## 2015-10-16 DIAGNOSIS — E1122 Type 2 diabetes mellitus with diabetic chronic kidney disease: Secondary | ICD-10-CM | POA: Diagnosis not present

## 2015-10-16 DIAGNOSIS — E782 Mixed hyperlipidemia: Secondary | ICD-10-CM | POA: Diagnosis not present

## 2015-10-16 DIAGNOSIS — N182 Chronic kidney disease, stage 2 (mild): Secondary | ICD-10-CM | POA: Diagnosis not present

## 2015-11-27 ENCOUNTER — Encounter (HOSPITAL_COMMUNITY): Payer: Self-pay | Admitting: Emergency Medicine

## 2015-11-27 ENCOUNTER — Inpatient Hospital Stay (HOSPITAL_COMMUNITY)
Admission: EM | Admit: 2015-11-27 | Discharge: 2015-11-30 | DRG: 394 | Disposition: A | Discharge status: Home / Self Care | Payer: PPO | Attending: Internal Medicine | Admitting: Internal Medicine

## 2015-11-27 DIAGNOSIS — Z794 Long term (current) use of insulin: Secondary | ICD-10-CM

## 2015-11-27 DIAGNOSIS — Z87440 Personal history of urinary (tract) infections: Secondary | ICD-10-CM | POA: Diagnosis not present

## 2015-11-27 DIAGNOSIS — N390 Urinary tract infection, site not specified: Secondary | ICD-10-CM | POA: Diagnosis present

## 2015-11-27 DIAGNOSIS — Z7984 Long term (current) use of oral hypoglycemic drugs: Secondary | ICD-10-CM

## 2015-11-27 DIAGNOSIS — E119 Type 2 diabetes mellitus without complications: Secondary | ICD-10-CM | POA: Diagnosis not present

## 2015-11-27 DIAGNOSIS — K5730 Diverticulosis of large intestine without perforation or abscess without bleeding: Secondary | ICD-10-CM | POA: Diagnosis present

## 2015-11-27 DIAGNOSIS — K921 Melena: Secondary | ICD-10-CM | POA: Diagnosis present

## 2015-11-27 DIAGNOSIS — I10 Essential (primary) hypertension: Secondary | ICD-10-CM | POA: Diagnosis present

## 2015-11-27 DIAGNOSIS — Z87891 Personal history of nicotine dependence: Secondary | ICD-10-CM | POA: Diagnosis not present

## 2015-11-27 DIAGNOSIS — Z86010 Personal history of colonic polyps: Secondary | ICD-10-CM

## 2015-11-27 DIAGNOSIS — N179 Acute kidney failure, unspecified: Secondary | ICD-10-CM | POA: Diagnosis not present

## 2015-11-27 DIAGNOSIS — Z8249 Family history of ischemic heart disease and other diseases of the circulatory system: Secondary | ICD-10-CM

## 2015-11-27 DIAGNOSIS — E869 Volume depletion, unspecified: Secondary | ICD-10-CM | POA: Diagnosis not present

## 2015-11-27 DIAGNOSIS — D122 Benign neoplasm of ascending colon: Secondary | ICD-10-CM | POA: Diagnosis not present

## 2015-11-27 DIAGNOSIS — K625 Hemorrhage of anus and rectum: Secondary | ICD-10-CM | POA: Diagnosis not present

## 2015-11-27 DIAGNOSIS — G4730 Sleep apnea, unspecified: Secondary | ICD-10-CM | POA: Diagnosis present

## 2015-11-27 DIAGNOSIS — F329 Major depressive disorder, single episode, unspecified: Secondary | ICD-10-CM | POA: Diagnosis present

## 2015-11-27 DIAGNOSIS — K621 Rectal polyp: Secondary | ICD-10-CM | POA: Diagnosis not present

## 2015-11-27 DIAGNOSIS — IMO0001 Reserved for inherently not codable concepts without codable children: Secondary | ICD-10-CM

## 2015-11-27 DIAGNOSIS — K922 Gastrointestinal hemorrhage, unspecified: Secondary | ICD-10-CM | POA: Diagnosis present

## 2015-11-27 DIAGNOSIS — K219 Gastro-esophageal reflux disease without esophagitis: Secondary | ICD-10-CM | POA: Diagnosis not present

## 2015-11-27 DIAGNOSIS — D128 Benign neoplasm of rectum: Secondary | ICD-10-CM | POA: Diagnosis not present

## 2015-11-27 LAB — URINALYSIS, ROUTINE W REFLEX MICROSCOPIC
Bilirubin Urine: NEGATIVE
Glucose, UA: NEGATIVE mg/dL
Ketones, ur: NEGATIVE mg/dL
NITRITE: NEGATIVE
Protein, ur: NEGATIVE mg/dL
pH: 6 (ref 5.0–8.0)

## 2015-11-27 LAB — URINE MICROSCOPIC-ADD ON: SQUAMOUS EPITHELIAL / LPF: NONE SEEN

## 2015-11-27 NOTE — ED Notes (Signed)
Observed that patient had dark red bloody stool.

## 2015-11-27 NOTE — ED Provider Notes (Signed)
[facility] DEPT Provider Note   CSN: [id number] Arrival date & time: 11/27/15  2216  By signing my name below, I, Albert Garrison, attest that this documentation has been prepared under the direction and in the presence of Albert Schwartz, MD. Electronically Signed: Hansel Garrison, ED Scribe. 11/27/15. 11:12 PM.     History   Chief Complaint Chief Complaint  Patient presents with  . Rectal Bleeding    HPI Albert Garrison is a 70 y.o. male with h/o DM who presents to the Emergency Department complaining of an episode of bloody stool tonight with associated dull abdominal pain. Pt reports his bowel movement this evening was loose and appeared to be streaked with blood. Pt notes his stools have been intermittently loose this week as well, but notes that he has not seen blood prior to tonight. He also complains that he has been experiencing dysuria, frequency and urgency this week, which he reports is similar to prior bladder infections. He reports he has not taken any OTC medications or received a course of antibiotics for his urinary symptoms. Pt is not currently taking any blood thinners. Pt reports h/o diverticulitis. No h/o hemorrhoids. He denies additional complaints.   The history is provided by the patient. No language interpreter was used.    Past Medical History:  Diagnosis Date  . Arthritis   . Complicated UTI (urinary tract infection) Dec.2015  . Depression    hx. of  . Diabetes mellitus without complication (HCC)    diet controlled  . Foley catheter in place 03/20/2014  . GERD (gastroesophageal reflux disease)    occasionally  . Heart murmur    hx. of at 17 yrs. old  . Hypertension   . Pneumonia    hx. of  . Rheumatic fever    when 4 yrs. old  . Sleep apnea    does not use c-pap machine  . Sodium (Na) deficiency    managed in nursing center12/2015-Feb-2016    Patient Active Problem List   Diagnosis Date Noted  . Lower GI bleed 11/28/2015  . Benign prostatic  hypertrophy with urinary retention 01/20/2015  . Urinary retention 05/21/2014  . ARF (acute renal failure) ([place]) 03/26/2014  . IDDM (insulin dependent diabetes mellitus) ([place]) 03/26/2014  . UTI (lower urinary tract infection) 03/24/2014  . Hyponatremia 03/24/2014  . Acute encephalopathy 03/24/2014    Past Surgical History:  Procedure Laterality Date  . CARPAL TUNNEL RELEASE Left 2010  . CERVICAL SPINE SURGERY  2010  . KNEE ARTHROSCOPY  1989  . LUMBAR LAMINECTOMY  1986  . Nose surgery - broken nose    . TRANSURETHRAL INCISION OF PROSTATE N/A 01/20/2015   Procedure: TRANSURETHRAL INCISION OF THE PROSTATE (TUIP);  Surgeon: Irine Seal, MD;  Location: [facility];  Service: Urology;  Laterality: N/A;       Home Medications    Prior to Admission medications   Medication Sig Start Date End Date Taking? Authorizing Provider  amLODipine (NORVASC) 10 MG tablet Take 10 mg by mouth at bedtime.     Historical Provider, MD  docusate sodium (COLACE) 100 MG capsule Take 1 capsule (100 mg total) by mouth 2 (two) times daily. 01/21/15   Irine Seal, MD  glipiZIDE (GLUCOTROL XL) 10 MG 24 hr tablet Take 10 mg by mouth daily.    Historical Provider, MD  ibuprofen (ADVIL,MOTRIN) 200 MG tablet Take 200 mg by mouth every 6 (six) hours as needed for moderate pain.    Historical Provider, MD  neomycin-bacitracin-polymyxin (  NEOSPORIN) ointment Apply 1 application topically 2 (two) times daily as needed for wound care. apply to ey    Historical Provider, MD  Oxymetazoline HCl (EQ NASAL SPRAY NA) Place 1 spray into the nose daily as needed (Allergies).    Historical Provider, MD  sulfamethoxazole-trimethoprim (BACTRIM,SEPTRA) 400-80 MG tablet Take 1 tablet by mouth 2 (two) times daily. Pt to start 3 days before procedure    Historical Provider, MD    Family History No family history on file.  Social History Social History  Substance Use Topics  . Smoking status: Former Smoker    Quit date: 08/06/1975  .  Smokeless tobacco: Never Used  . Alcohol use 1.8 oz/week    3 Cans of beer per week     Comment: 3 12 oz cans of beer each week.None since 03/2014     Allergies   Review of patient's allergies indicates no known allergies.   Review of Systems Review of Systems  Gastrointestinal: Positive for abdominal pain, blood in stool and hematochezia.  Genitourinary: Positive for dysuria, frequency and urgency.  All other systems reviewed and are negative.    Physical Exam Updated Vital Signs BP 176/93 (BP Location: Left Arm)   Pulse (!) 52   Temp 98.6 F (37 C) (Oral)   Resp 18   Ht 5\' 11"  (1.803 m)   Wt 180 lb (81.6 kg)   SpO2 97%   BMI 25.10 kg/m   Physical Exam  Constitutional: He is oriented to person, place, and time. He appears well-developed and well-nourished. No distress.  HENT:  Head: Normocephalic and atraumatic.  Eyes: Pupils are equal, round, and reactive to light.  Neck: Normal range of motion.  Cardiovascular: Normal rate and intact distal pulses.   Pulmonary/Chest: No respiratory distress.  Abdominal: Normal appearance. He exhibits no distension.  Musculoskeletal: Normal range of motion.  Neurological: He is alert and oriented to person, place, and time. No cranial nerve deficit.  Skin: Skin is warm and dry. No rash noted.  Psychiatric: He has a normal mood and affect. His behavior is normal.  Nursing note and vitals reviewed.    ED Treatments / Results  Labs (all labs ordered are listed, but only abnormal results are displayed) Labs Reviewed  COMPREHENSIVE METABOLIC PANEL - Abnormal; Notable for the following:       Result Value   Sodium 134 (*)    Glucose, Bld 130 (*)    BUN 22 (*)    Creatinine, Ser 1.54 (*)    Calcium 8.7 (*)    ALT 13 (*)    GFR calc non Af Amer 44 (*)    GFR calc Af Amer 51 (*)    All other components within normal limits  URINALYSIS, ROUTINE W REFLEX MICROSCOPIC (NOT AT [facility]) - Abnormal; Notable for the following:     Specific Gravity, Urine <1.005 (*)    Hgb urine dipstick SMALL (*)    Leukocytes, UA LARGE (*)    All other components within normal limits  URINE MICROSCOPIC-ADD ON - Abnormal; Notable for the following:    Bacteria, UA FEW (*)    All other components within normal limits  URINE CULTURE  CBC WITH DIFFERENTIAL/PLATELET  TYPE AND SCREEN    EKG  EKG Interpretation None       Radiology No results found.  Procedures Procedures (including critical care time)  DIAGNOSTIC STUDIES: Oxygen Saturation is 97% on RA, normal by my interpretation.    COORDINATION OF CARE: 11:12 PM  Discussed treatment plan with pt at bedside which includes lab work and pt agreed to plan.    Medications Ordered in ED Medications  cefTRIAXone (ROCEPHIN) 1 g in dextrose 5 % 50 mL IVPB (not administered)     Initial Impression / Assessment and Plan / ED Course  I have reviewed the triage vital signs and the nursing notes.  Pertinent labs & imaging results that were available during my care of the patient were reviewed by me and considered in my medical decision making (see chart for details).  Clinical Course      Final Clinical Impressions(s) / ED Diagnoses   Final diagnoses:  Rectal bleeding  UTI (lower urinary tract infection)    New Prescriptions New Prescriptions   No medications on file    I personally performed the services described in this documentation, which was scribed in my presence. The recorded information has been reviewed and considered.   Albert Schwartz, MD 11/28/15 [phone]

## 2015-11-27 NOTE — ED Triage Notes (Signed)
Pt c/o dysuria and states there was bright red blood in stool tonight. Pt states he has history of diverticulitis.

## 2015-11-28 ENCOUNTER — Encounter (HOSPITAL_COMMUNITY): Payer: Self-pay | Admitting: *Deleted

## 2015-11-28 DIAGNOSIS — K625 Hemorrhage of anus and rectum: Secondary | ICD-10-CM | POA: Diagnosis not present

## 2015-11-28 DIAGNOSIS — Z8249 Family history of ischemic heart disease and other diseases of the circulatory system: Secondary | ICD-10-CM | POA: Diagnosis not present

## 2015-11-28 DIAGNOSIS — F329 Major depressive disorder, single episode, unspecified: Secondary | ICD-10-CM | POA: Diagnosis not present

## 2015-11-28 DIAGNOSIS — E119 Type 2 diabetes mellitus without complications: Secondary | ICD-10-CM

## 2015-11-28 DIAGNOSIS — N390 Urinary tract infection, site not specified: Secondary | ICD-10-CM

## 2015-11-28 DIAGNOSIS — K219 Gastro-esophageal reflux disease without esophagitis: Secondary | ICD-10-CM | POA: Diagnosis not present

## 2015-11-28 DIAGNOSIS — K5730 Diverticulosis of large intestine without perforation or abscess without bleeding: Secondary | ICD-10-CM | POA: Diagnosis not present

## 2015-11-28 DIAGNOSIS — Z794 Long term (current) use of insulin: Secondary | ICD-10-CM

## 2015-11-28 DIAGNOSIS — Z87891 Personal history of nicotine dependence: Secondary | ICD-10-CM | POA: Diagnosis not present

## 2015-11-28 DIAGNOSIS — Z87440 Personal history of urinary (tract) infections: Secondary | ICD-10-CM | POA: Diagnosis not present

## 2015-11-28 DIAGNOSIS — K922 Gastrointestinal hemorrhage, unspecified: Secondary | ICD-10-CM | POA: Diagnosis present

## 2015-11-28 DIAGNOSIS — K921 Melena: Secondary | ICD-10-CM | POA: Diagnosis not present

## 2015-11-28 DIAGNOSIS — Z86010 Personal history of colonic polyps: Secondary | ICD-10-CM | POA: Diagnosis not present

## 2015-11-28 DIAGNOSIS — E869 Volume depletion, unspecified: Secondary | ICD-10-CM | POA: Diagnosis not present

## 2015-11-28 DIAGNOSIS — N179 Acute kidney failure, unspecified: Secondary | ICD-10-CM | POA: Diagnosis not present

## 2015-11-28 DIAGNOSIS — D128 Benign neoplasm of rectum: Secondary | ICD-10-CM | POA: Diagnosis not present

## 2015-11-28 DIAGNOSIS — Z7984 Long term (current) use of oral hypoglycemic drugs: Secondary | ICD-10-CM | POA: Diagnosis not present

## 2015-11-28 DIAGNOSIS — I10 Essential (primary) hypertension: Secondary | ICD-10-CM | POA: Diagnosis not present

## 2015-11-28 DIAGNOSIS — D122 Benign neoplasm of ascending colon: Secondary | ICD-10-CM | POA: Diagnosis not present

## 2015-11-28 DIAGNOSIS — G4730 Sleep apnea, unspecified: Secondary | ICD-10-CM | POA: Diagnosis not present

## 2015-11-28 DIAGNOSIS — K621 Rectal polyp: Secondary | ICD-10-CM | POA: Diagnosis not present

## 2015-11-28 LAB — COMPREHENSIVE METABOLIC PANEL
ALBUMIN: 4.3 g/dL (ref 3.5–5.0)
ALK PHOS: 55 U/L (ref 38–126)
ALT: 13 U/L — AB (ref 17–63)
ALT: 13 U/L — AB (ref 17–63)
ANION GAP: 6 (ref 5–15)
AST: 16 U/L (ref 15–41)
AST: 17 U/L (ref 15–41)
Albumin: 4 g/dL (ref 3.5–5.0)
Alkaline Phosphatase: 49 U/L (ref 38–126)
Anion gap: 7 (ref 5–15)
BILIRUBIN TOTAL: 0.7 mg/dL (ref 0.3–1.2)
BUN: 21 mg/dL — AB (ref 6–20)
BUN: 22 mg/dL — ABNORMAL HIGH (ref 6–20)
CALCIUM: 8.7 mg/dL — AB (ref 8.9–10.3)
CO2: 26 mmol/L (ref 22–32)
CO2: 24 mmol/L (ref 22–32)
CREATININE: 1.47 mg/dL — AB (ref 0.61–1.24)
Calcium: 9.1 mg/dL (ref 8.9–10.3)
Chloride: 102 mmol/L (ref 101–111)
Chloride: 104 mmol/L (ref 101–111)
Creatinine, Ser: 1.54 mg/dL — ABNORMAL HIGH (ref 0.61–1.24)
GFR calc Af Amer: 51 mL/min — ABNORMAL LOW (ref >60)
GFR calc non Af Amer: 44 mL/min — ABNORMAL LOW (ref >60)
GFR, EST AFRICAN AMERICAN: 54 mL/min — AB (ref >60)
GFR, EST NON AFRICAN AMERICAN: 46 mL/min — AB (ref >60)
GLUCOSE: 130 mg/dL — AB (ref 65–99)
Glucose, Bld: 136 mg/dL — ABNORMAL HIGH (ref 65–99)
Potassium: 4.1 mmol/L (ref 3.5–5.1)
Potassium: 4.2 mmol/L (ref 3.5–5.1)
SODIUM: 134 mmol/L — AB (ref 135–145)
Sodium: 135 mmol/L (ref 135–145)
TOTAL PROTEIN: 6.9 g/dL (ref 6.5–8.1)
Total Bilirubin: 0.7 mg/dL (ref 0.3–1.2)
Total Protein: 7.4 g/dL (ref 6.5–8.1)

## 2015-11-28 LAB — CBC
HEMATOCRIT: 39.8 % (ref 39.0–52.0)
HEMATOCRIT: 38.5 % — AB (ref 39.0–52.0)
HEMOGLOBIN: 13.2 g/dL (ref 13.0–17.0)
Hemoglobin: 13.7 g/dL (ref 13.0–17.0)
MCH: 30.4 pg (ref 26.0–34.0)
MCH: 30.4 pg (ref 26.0–34.0)
MCHC: 34.4 g/dL (ref 30.0–36.0)
MCHC: 34.3 g/dL (ref 30.0–36.0)
MCV: 88.2 fL (ref 78.0–100.0)
MCV: 88.7 fL (ref 78.0–100.0)
PLATELETS: 221 10*3/uL (ref 150–400)
Platelets: 217 10*3/uL (ref 150–400)
RBC: 4.51 MIL/uL (ref 4.22–5.81)
RBC: 4.34 MIL/uL (ref 4.22–5.81)
RDW: 13.2 % (ref 11.5–15.5)
RDW: 13.3 % (ref 11.5–15.5)
WBC: 7.8 10*3/uL (ref 4.0–10.5)
WBC: 8.5 10*3/uL (ref 4.0–10.5)

## 2015-11-28 LAB — CBC WITH DIFFERENTIAL/PLATELET
BASOS ABS: 0 10*3/uL (ref 0.0–0.1)
Basophils Relative: 0 %
Eosinophils Absolute: 0.1 10*3/uL (ref 0.0–0.7)
Eosinophils Relative: 2 %
HEMATOCRIT: 42.6 % (ref 39.0–52.0)
Hemoglobin: 14.7 g/dL (ref 13.0–17.0)
LYMPHS PCT: 37 %
Lymphs Abs: 2.9 10*3/uL (ref 0.7–4.0)
MCH: 30.4 pg (ref 26.0–34.0)
MCHC: 34.5 g/dL (ref 30.0–36.0)
MCV: 88 fL (ref 78.0–100.0)
MONO ABS: 0.8 10*3/uL (ref 0.1–1.0)
Monocytes Relative: 10 %
NEUTROS ABS: 4 10*3/uL (ref 1.7–7.7)
Neutrophils Relative %: 51 %
Platelets: 241 10*3/uL (ref 150–400)
RBC: 4.84 MIL/uL (ref 4.22–5.81)
RDW: 13.1 % (ref 11.5–15.5)
WBC: 7.9 10*3/uL (ref 4.0–10.5)

## 2015-11-28 LAB — GLUCOSE, CAPILLARY
GLUCOSE-CAPILLARY: 142 mg/dL — AB (ref 65–99)
GLUCOSE-CAPILLARY: 109 mg/dL — AB (ref 65–99)
Glucose-Capillary: 209 mg/dL — ABNORMAL HIGH (ref 65–99)
Glucose-Capillary: 87 mg/dL (ref 65–99)

## 2015-11-28 LAB — TYPE AND SCREEN
ABO/RH(D): A POS
Antibody Screen: NEGATIVE

## 2015-11-28 MED ORDER — ONDANSETRON HCL 4 MG PO TABS: 4.0000 mg | ORAL_TABLET | Freq: Four times a day (QID) | ORAL | Status: DC | PRN

## 2015-11-28 MED ORDER — PEG 3350-KCL-NA BICARB-NACL 420 G PO SOLR
4000.0000 mL | Freq: Once | ORAL | Status: DC
Start: 2015-11-28 — End: 2015-11-28

## 2015-11-28 MED ORDER — SODIUM CHLORIDE 0.9 % IV SOLN
INTRAVENOUS | Status: DC
  Administered 2015-11-28 – 2015-11-30 (×5): via INTRAVENOUS

## 2015-11-28 MED ORDER — DEXTROSE 5 % IV SOLN
1.0000 g | INTRAVENOUS | Status: DC
  Administered 2015-11-29 – 2015-11-30 (×2): 1 g via INTRAVENOUS
  Filled 2015-11-28 (×3): qty 10

## 2015-11-28 MED ORDER — INSULIN ASPART 100 UNIT/ML ~~LOC~~ SOLN
0.0000 [IU] | Freq: Three times a day (TID) | SUBCUTANEOUS | Status: DC
  Administered 2015-11-28: 1 [IU] via SUBCUTANEOUS
  Administered 2015-11-28: 3 [IU] via SUBCUTANEOUS
  Administered 2015-11-29: 1 [IU] via SUBCUTANEOUS

## 2015-11-28 MED ORDER — ONDANSETRON HCL 4 MG/2ML IJ SOLN: 4.0000 mg | Freq: Four times a day (QID) | INTRAMUSCULAR | Status: DC | PRN

## 2015-11-28 MED ORDER — PEG 3350-KCL-NA BICARB-NACL 420 G PO SOLR
2000.0000 mL | Freq: Once | ORAL | Status: AC
  Administered 2015-11-29: 2000 mL via ORAL
  Filled 2015-11-28: qty 4000

## 2015-11-28 MED ORDER — DEXTROSE 5 % IV SOLN
1.0000 g | Freq: Once | INTRAVENOUS | Status: AC
  Administered 2015-11-28: 1 g via INTRAVENOUS
  Filled 2015-11-28: qty 10

## 2015-11-28 MED ORDER — AMLODIPINE BESYLATE 5 MG PO TABS
10.0000 mg | ORAL_TABLET | Freq: Every day | ORAL | Status: DC
  Administered 2015-11-28 – 2015-11-29 (×2): 10 mg via ORAL
  Filled 2015-11-28 (×2): qty 2

## 2015-11-28 MED ORDER — HYDRALAZINE HCL 25 MG PO TABS
25.0000 mg | ORAL_TABLET | Freq: Four times a day (QID) | ORAL | Status: DC | PRN
  Administered 2015-11-28 – 2015-11-29 (×2): 25 mg via ORAL
  Filled 2015-11-28 (×2): qty 1

## 2015-11-28 MED ORDER — PEG 3350-KCL-NA BICARB-NACL 420 G PO SOLR
2000.0000 mL | Freq: Once | ORAL | Status: AC
  Administered 2015-11-30: 2000 mL via ORAL

## 2015-11-28 NOTE — Progress Notes (Signed)
Pharmacy Antibiotic Note  Albert Garrison is a 70 y.o. male admitted on 11/27/2015 with UTI.  Pharmacy has been consulted for Ceftriaxone dosing.  Plan: Ceftriaxone 1gm IV q24h F/u cultures  Monitor V/S and labs  Height: 5\' 11"  (180.3 cm) Weight: 179 lb 11.5 oz (81.5 kg) IBW/kg (Calculated) : 75.3  Temp (24hrs), Avg:98.3 F (36.8 C), Min:97.9 F (36.6 C), Max:98.6 F (37 C)   Recent Labs Lab 11/27/15 2354 11/28/15 0451  WBC 7.9 7.8  CREATININE 1.54* 1.47*    Estimated Creatinine Clearance: 49.1 mL/min (by C-G formula based on SCr of 1.47 mg/dL).    No Known Allergies  Antimicrobials this admission: Ceftriaxone 8/26 >>  Dose adjustments this admission: n/a  Microbiology results: 8/26 UCx: pending   Thank you for allowing pharmacy to be a part of this patient's care.  Isac Sarna, BS [place], [place] Clinical Pharmacist Pager [phone] 11/28/2015 8:44 AM

## 2015-11-28 NOTE — Consult Note (Signed)
Referring Provider: No ref. provider found Primary Care Physician:  Wende Neighbors, MD Primary Gastroenterologist:  Dr.Congetta Odriscoll  Reason for Consultation:  Hematochezia  HPI:   Pleasant 70 year old gentleman admitted to hospital with gross dark red blood per rectum noted when he sat on the commode to urinate. Recent, recurrent urinary tract infections. He denies any associated abdominal pain. One episode of bleeding at home and one witnessed by ED staff when he presented here yesterday.  Hemoglobin has dropped from 14.7-13.7 over night.  He states that 2 colonoscopies in the past last one done up and eating by Dr. Clerance Lav many years ago. He told me that he had polyps but they were benign.  He reports a history of diverticulitis. No family history of colorectal neoplasia.  He has not had any melena. No nausea, vomiting, early satiety.  Very rare bouts of GERD. No dysphagia.  Rare consumption of beer. No NSAID use.  He has been given various medications in the past for possible bipolar. He tells me when he has taken opioids in the past he gets "wired up".  Past Medical History:  Diagnosis Date  . Arthritis   . Complicated UTI (urinary tract infection) Dec.2015  . Depression    hx. of  . Diabetes mellitus without complication (HCC)    diet controlled  . Foley catheter in place 03/20/2014  . GERD (gastroesophageal reflux disease)    occasionally  . Heart murmur    hx. of at 17 yrs. old  . Hypertension   . Pneumonia    hx. of  . Rheumatic fever    when 4 yrs. old  . Sleep apnea    does not use c-pap machine  . Sodium (Na) deficiency    managed in nursing center12/2015-Feb-2016    Past Surgical History:  Procedure Laterality Date  . CARPAL TUNNEL RELEASE Left 2010  . CERVICAL SPINE SURGERY  2010  . KNEE ARTHROSCOPY  1989  . LUMBAR LAMINECTOMY  1986  . Nose surgery - broken nose    . TRANSURETHRAL INCISION OF PROSTATE N/A 01/20/2015   Procedure: TRANSURETHRAL INCISION OF  THE PROSTATE (TUIP);  Surgeon: Irine Seal, MD;  Location: [facility];  Service: Urology;  Laterality: N/A;    Prior to Admission medications   Medication Sig Start Date End Date Taking? Authorizing Provider  amLODipine (NORVASC) 10 MG tablet Take 10 mg by mouth at bedtime.    Yes Historical Provider, MD  glipiZIDE (GLUCOTROL XL) 10 MG 24 hr tablet Take 10 mg by mouth 2 (two) times daily.    Yes Historical Provider, MD  ibuprofen (ADVIL,MOTRIN) 200 MG tablet Take 200 mg by mouth every 6 (six) hours as needed for moderate pain.   Yes Historical Provider, MD  Oxymetazoline HCl (EQ NASAL SPRAY NA) Place 1 spray into the nose daily as needed (Allergies).   Yes Historical Provider, MD  pioglitazone (ACTOS) 15 MG tablet Take 15 mg by mouth daily.   Yes Historical Provider, MD  docusate sodium (COLACE) 100 MG capsule Take 1 capsule (100 mg total) by mouth 2 (two) times daily. 01/21/15   Irine Seal, MD  neomycin-bacitracin-polymyxin (NEOSPORIN) ointment Apply 1 application topically 2 (two) times daily as needed for wound care. apply to ey    Historical Provider, MD    Current Facility-Administered Medications  Medication Dose Route Frequency Provider Last Rate Last Dose  . 0.9 %  sodium chloride infusion   Intravenous Continuous Oswald Hillock, MD 100 mL/hr at 11/28/15  1403    . amLODipine (NORVASC) tablet 10 mg  10 mg Oral QHS Oswald Hillock, MD      . Derrill Memo ON 11/29/2015] cefTRIAXone (ROCEPHIN) 1 g in dextrose 5 % 50 mL IVPB  1 g Intravenous Q24H Kathie Dike, MD      . hydrALAZINE (APRESOLINE) tablet 25 mg  25 mg Oral Q6H PRN Oswald Hillock, MD   25 mg at 11/28/15 0437  . insulin aspart (novoLOG) injection 0-9 Units  0-9 Units Subcutaneous TID WC Oswald Hillock, MD   3 Units at 11/28/15 1228  . ondansetron (ZOFRAN) tablet 4 mg  4 mg Oral Q6H PRN Oswald Hillock, MD       Or  . ondansetron (ZOFRAN) injection 4 mg  4 mg Intravenous Q6H PRN Oswald Hillock, MD        Allergies as of 11/27/2015  . (No Known  Allergies)    History reviewed. No pertinent family history.  Patient is divorced. He has 4 children. He worked as a Engineer, mining. No heavy alcohol-drinks about 1-2 beers a month. No NSAIDs or illicit drugs.   Social History   Social History  . Marital status: Divorced    Spouse name: N/A  . Number of children: N/A  . Years of education: N/A   Occupational History  . Not on file.   Social History Main Topics  . Smoking status: Former Smoker    Quit date: 08/06/1975  . Smokeless tobacco: Never Used  . Alcohol use 1.8 oz/week    3 Cans of beer per week     Comment: 3 12 oz cans of beer each week.None since 03/2014  . Drug use: No  . Sexual activity: Yes    Birth control/ protection: None   Other Topics Concern  . Not on file   Social History Narrative  . No narrative on file    Review of Systems: As in history of present illness  Physical Exam: Vital signs in last 24 hours: Temp:  [97.9 F (36.6 C)-98.6 F (37 C)] 97.9 F (36.6 C) (08/26 0346) Pulse Rate:  [52-65] 54 (08/26 0346) Resp:  [18] 18 (08/26 0346) BP: (161-184)/(82-93) 184/82 (08/26 0346) SpO2:  [97 %-100 %] 100 % (08/26 0346) Weight:  [179 lb 11.5 oz (81.5 kg)-180 lb (81.6 kg)] 179 lb 11.5 oz (81.5 kg) (08/26 0346) Last BM Date: 11/28/15 General:   Alert,  , pleasant and cooperative in NAD Neck:  Supple; no masses or thyromegaly. Lungs:  Clear throughout to auscultation.   No wheezes, crackles, or rhonchi. No acute distress. Heart:  Regular rate and rhythm; no murmurs, clicks, rubs,  or gallops. Abdomen:  Soft, nontender and nondistended. No masses, hepatosplenomegaly or hernias noted. Normal bowel sounds, without guarding, and without rebound.   Rectal:  Deferred until time of colonoscopy.    Intake/Output from previous day: 08/25 0701 - 08/26 0700 In: 91.7 [I.V.:91.7] Out: 450 [Urine:450] Intake/Output this shift: Total I/O In: 240 [P.O.:240] Out: -   Lab Results:  Recent  Labs  11/27/15 2354 11/28/15 0451  WBC 7.9 7.8  HGB 14.7 13.7  HCT 42.6 39.8  PLT 241 221   BMET  Recent Labs  11/27/15 2354 11/28/15 0451  NA 134* 135  K 4.1 4.2  CL 104 102  CO2 24 26  GLUCOSE 130* 136*  BUN 22* 21*  CREATININE 1.54* 1.47*  CALCIUM 8.7* 9.1   LFT  Recent Labs  11/28/15 0451  PROT 6.9  ALBUMIN 4.0  AST 16  ALT 13*  ALKPHOS 49  BILITOT 0.7     Impression:  Pleasant 70 year old gentleman admitted to the hospital with what appears to be essentially painless self-limiting hematochezia.  Remains hemodynamically stable.  1 g drop in hemoglobin thus far, remaining in the normal range at 13.7  Etiology of bleeding could be anything from benign anorectal sources to diverticula or AVMs. Less likely polyp/tumor.  He tells me that his last colonoscopy was done at [facility] by Dr. Clerance Lav (a general surgeon who left there a good 16 or 17 years ago as I recall).  Sketchy history of colon polyps in the past.  Further evaluation of rectal bleeding warranted.  I have offered the patient diagnostic colonoscopy either while he is here or in short interval as an outpatient. After some discussion, he wishes to go ahead and have his colonoscopy before he is discharged. I certainly feel that approach is reasonable.   Recommendations:  I have offered the patient a diagnostic colonoscopy on August 28.  The risks, benefits, limitations, alternatives and imponderables have been reviewed with the patient. Questions have been answered. All parties are agreeable.   Because of prior polypharmacy and his adverse reaction to opioid therapy previously, we'll enlist the help of anesthesia for deep sedation  Clear liquid diet for now. Recheck H&H tomorrow morning.  Further recommendations to follow.     Notice:  This dictation was prepared with Dragon dictation along with smaller phrase technology. Any transcriptional errors that result from this process are  unintentional and may not be corrected upon review.

## 2015-11-28 NOTE — Progress Notes (Signed)
Patient admitted to the hospital earlier this morning by Dr. Darrick Meigs.  Patient seen and examined. He is not having any abdominal pain. Last bowel movement was in the emergency room and was reported to be bloody. He denies any recent NSAID use. He reports having colonoscopy many years ago, but nothing recently. Remainder the exam is unremarkable.  He's been admitted for further evaluation of GI bleeding. Etiologies include possible diverticular bleeding. Hemoglobin is relatively stable, continue cycle CBCs. We'll consult GI to see if colonoscopy needs to be updated. He also has possible urinary tract infection and is on Rocephin. Discharged home once GI workup is complete.   Scheryl Sanborn

## 2015-11-28 NOTE — H&P (Signed)
TRH H&P   Patient Demographics:    Albert Garrison, is a 70 y.o. male  MRN: [id number]  DOB - [birth year]  Admit Date - 11/27/2015  Outpatient Primary MD for the patient is Wende Neighbors, MD  Referring MD/NP/PA: Dr Audie Pinto  Patient coming from: Home  Chief Complaint  Patient presents with  . Rectal Bleeding      HPI:    Albert Garrison  is a 70 y.o. male, *With history of hypertension, diabetes mellitus, complicated UTI, arthritis who came to hospital after he had episode of bloody stool at home. Patient also had loose bowel movements which appeared to be straight with blood. Patient also has associated abdominal pain. Patient reports he has a history of diverticulitis in the past. And had colonoscopies done at [facility]. He also complains of dysuria and urgency of urination.  He denies nausea or vomiting. No chest pain or shortness of breath. No fever or chills   Review of systems:    In addition to the HPI above,   No Headache, No changes with Vision or hearing, No problems swallowing food or Liquids, + dysuria, No new skin rashes or bruises, No new joints pains-aches,  No new weakness, tingling, numbness in any extremity, No recent weight gain or loss, No polyuria, polydypsia or polyphagia, No significant Mental Stressors.  A full 10 point Review of Systems was done, except as stated above, all other Review of Systems were negative.   With Past History of the following :    Past Medical History:  Diagnosis Date  . Arthritis   . Complicated UTI (urinary tract infection) Dec.2015  . Depression    hx. of  . Diabetes mellitus without complication (HCC)    diet controlled  . Foley catheter in place 03/20/2014  . GERD (gastroesophageal reflux disease)    occasionally  . Heart murmur    hx. of at 17 yrs. old  . Hypertension   . Pneumonia    hx. of  . Rheumatic fever    when 4 yrs. old  .  Sleep apnea    does not use c-pap machine  . Sodium (Na) deficiency    managed in nursing center12/2015-Feb-2016      Past Surgical History:  Procedure Laterality Date  . CARPAL TUNNEL RELEASE Left 2010  . CERVICAL SPINE SURGERY  2010  . KNEE ARTHROSCOPY  1989  . LUMBAR LAMINECTOMY  1986  . Nose surgery - broken nose    . TRANSURETHRAL INCISION OF PROSTATE N/A 01/20/2015   Procedure: TRANSURETHRAL INCISION OF THE PROSTATE (TUIP);  Surgeon: Irine Seal, MD;  Location: [facility];  Service: Urology;  Laterality: N/A;      Social History:     Social History  Substance Use Topics  . Smoking status: Former Smoker    Quit date: 08/06/1975  . Smokeless tobacco: Never Used  . Alcohol use 1.8 oz/week    3 Cans of beer per week     Comment: 3 12 oz cans of beer each  week.None since 03/2014        Family History :   Father had CAD died at age of 22   Home Medications:   Prior to Admission medications   Medication Sig Start Date End Date Taking? Authorizing Provider  amLODipine (NORVASC) 10 MG tablet Take 10 mg by mouth at bedtime.     Historical Provider, MD  docusate sodium (COLACE) 100 MG capsule Take 1 capsule (100 mg total) by mouth 2 (two) times daily. 01/21/15   Irine Seal, MD  glipiZIDE (GLUCOTROL XL) 10 MG 24 hr tablet Take 10 mg by mouth daily.    Historical Provider, MD  ibuprofen (ADVIL,MOTRIN) 200 MG tablet Take 200 mg by mouth every 6 (six) hours as needed for moderate pain.    Historical Provider, MD  neomycin-bacitracin-polymyxin (NEOSPORIN) ointment Apply 1 application topically 2 (two) times daily as needed for wound care. apply to ey    Historical Provider, MD  Oxymetazoline HCl (EQ NASAL SPRAY NA) Place 1 spray into the nose daily as needed (Allergies).    Historical Provider, MD  sulfamethoxazole-trimethoprim (BACTRIM,SEPTRA) 400-80 MG tablet Take 1 tablet by mouth 2 (two) times daily. Pt to start 3 days before procedure    Historical Provider, MD     Allergies:     No Known Allergies   Physical Exam:   Vitals  Blood pressure 176/93, pulse (!) 52, temperature 98.6 F (37 C), temperature source Oral, resp. rate 18, height 5\' 11"  (1.803 m), weight 81.6 kg (180 lb), SpO2 97 %.   1. General Caucasian male* lying in bed in NAD, cooperative*with exam  2. Normal affect and insight, Awake Alert, Oriented X 3.  3. No F.N deficits, ALL C.Nerves Intact, Strength 5/5 all 4 extremities, Sensation intact all 4 extremities, Plantars down going.  4. Ears and Eyes appear Normal, Conjunctivae clear, PERRLA. Moist Oral Mucosa.  5. Supple Neck, No JVD, No cervical lymphadenopathy appriciated, No Carotid Bruits.  6. Symmetrical Chest wall movement, Good air movement bilaterally, CTAB.  7. RRR, No Gallops, Rubs or Murmurs, No Parasternal Heave.No Leg edema  8. Positive Bowel Sounds, Abdomen Soft, mild generalized tenderness to palpation, No organomegaly appriciated,No rebound -guarding or rigidity.  9.  No Cyanosis, Normal Skin Turgor, No Skin Rash or Bruise.  10. Good muscle tone,  joints appear normal , no effusions, Normal ROM.      Data Review:    CBC  Recent Labs Lab 11/27/15 2354  WBC 7.9  HGB 14.7  HCT 42.6  PLT 241  MCV 88.0  MCH 30.4  MCHC 34.5  RDW 13.1  LYMPHSABS 2.9  MONOABS 0.8  EOSABS 0.1  BASOSABS 0.0   ------------------------------------------------------------------------------------------------------------------  Chemistries   Recent Labs Lab 11/27/15 2354  NA 134*  K 4.1  CL 104  CO2 24  GLUCOSE 130*  BUN 22*  CREATININE 1.54*  CALCIUM 8.7*  AST 17  ALT 13*  ALKPHOS 55  BILITOT 0.7   ------------------------------------------------------------------------------------------------------------------  ------------------------------------------------------------------------------------------------------------------ GFR: Estimated Creatinine Clearance: 46.9 mL/min (by C-G formula based on SCr of 1.54  mg/dL). Liver Function Tests:  Recent Labs Lab 11/27/15 2354  AST 17  ALT 13*  ALKPHOS 55  BILITOT 0.7  PROT 7.4  ALBUMIN 4.3    --------------------------------------------------------------------------------------------------------------- Urine analysis:    Component Value Date/Time   COLORURINE YELLOW 11/27/2015 [place] 11/27/2015 2315   LABSPEC <1.005 (L) 11/27/2015 2315   PHURINE 6.0 11/27/2015 2315   GLUCOSEU NEGATIVE 11/27/2015 2315   HGBUR SMALL (  A) 11/27/2015 2315   BILIRUBINUR NEGATIVE 11/27/2015 2315   KETONESUR NEGATIVE 11/27/2015 2315   PROTEINUR NEGATIVE 11/27/2015 2315   UROBILINOGEN 0.2 03/24/2014 1320   NITRITE NEGATIVE 11/27/2015 2315   LEUKOCYTESUR LARGE (A) 11/27/2015 2315      ----------------------------------------------------------------------------------------------------------------   Imaging Results:    No results found.  My personal review of EKG: Rhythm NSR   Assessment & Plan:    Active Problems:   UTI (lower urinary tract infection)   IDDM (insulin dependent diabetes mellitus) (HCC)   Lower GI bleed   Rectal bleed   1. Rectal bleeding- ? Diverticular bleed, patient says he has history of diverticulitis 15 years ago. Had colonoscopies done at [facility], no records available at this time. H&H is stable, will follow CBC in a.m. 2. UTI- patient has symptoms of urgency and dysuria. Has a history of complicated UTI. We'll start ceftriaxone, urine culture has been obtained. 3. Diabetes mellitus- hold oral hyperglycemic agents, we'll start sliding scale insulin with NovoLog. 4. Hypertension-blood pressure was controlled, continue amlodipine 10 mg by mouth daily.   DVT Prophylaxis-   SCDs   AM Labs Ordered, also please review Full Orders  Family Communication: No family at bedside  Code Status:  Full code  Admission status: Observation    Time spent in minutes : 60 minutes   Gara Kincade S M.D on 11/28/2015 at 3:10  AM  Between 7am to 7pm - Pager - [phone]. After 7pm go to [email or web address] - password [facility]  [facility] - Office  [phone]

## 2015-11-29 DIAGNOSIS — K922 Gastrointestinal hemorrhage, unspecified: Secondary | ICD-10-CM | POA: Diagnosis not present

## 2015-11-29 DIAGNOSIS — N390 Urinary tract infection, site not specified: Secondary | ICD-10-CM | POA: Diagnosis not present

## 2015-11-29 DIAGNOSIS — E119 Type 2 diabetes mellitus without complications: Secondary | ICD-10-CM | POA: Diagnosis not present

## 2015-11-29 DIAGNOSIS — N179 Acute kidney failure, unspecified: Secondary | ICD-10-CM | POA: Diagnosis not present

## 2015-11-29 DIAGNOSIS — K625 Hemorrhage of anus and rectum: Secondary | ICD-10-CM | POA: Diagnosis not present

## 2015-11-29 LAB — CBC
HEMATOCRIT: 39.9 % (ref 39.0–52.0)
HEMOGLOBIN: 13.7 g/dL (ref 13.0–17.0)
MCH: 30.3 pg (ref 26.0–34.0)
MCHC: 34.3 g/dL (ref 30.0–36.0)
MCV: 88.3 fL (ref 78.0–100.0)
Platelets: 230 10*3/uL (ref 150–400)
RBC: 4.52 MIL/uL (ref 4.22–5.81)
RDW: 13.2 % (ref 11.5–15.5)
WBC: 7.2 10*3/uL (ref 4.0–10.5)

## 2015-11-29 LAB — HEMOGLOBIN A1C
HEMOGLOBIN A1C: 6.6 % — AB (ref 4.8–5.6)
Mean Plasma Glucose: 143 mg/dL

## 2015-11-29 LAB — GLUCOSE, CAPILLARY
GLUCOSE-CAPILLARY: 90 mg/dL (ref 65–99)
Glucose-Capillary: 127 mg/dL — ABNORMAL HIGH (ref 65–99)
Glucose-Capillary: 115 mg/dL — ABNORMAL HIGH (ref 65–99)

## 2015-11-29 LAB — BASIC METABOLIC PANEL
ANION GAP: 8 (ref 5–15)
BUN: 12 mg/dL (ref 6–20)
CALCIUM: 8.3 mg/dL — AB (ref 8.9–10.3)
CHLORIDE: 108 mmol/L (ref 101–111)
CO2: 22 mmol/L (ref 22–32)
Creatinine, Ser: 1.16 mg/dL (ref 0.61–1.24)
GFR calc non Af Amer: >60 mL/min (ref >60)
GLUCOSE: 125 mg/dL — AB (ref 65–99)
Potassium: 3.7 mmol/L (ref 3.5–5.1)
Sodium: 138 mmol/L (ref 135–145)

## 2015-11-29 MED ORDER — HYDRALAZINE HCL 25 MG PO TABS
25.0000 mg | ORAL_TABLET | Freq: Three times a day (TID) | ORAL | Status: DC
  Administered 2015-11-29 – 2015-11-30 (×4): 25 mg via ORAL
  Filled 2015-11-29 (×4): qty 1

## 2015-11-29 NOTE — Progress Notes (Signed)
PROGRESS NOTE    Albert Garrison  [id number] DOB: [birth year] DOA: 11/27/2015 PCP: Wende Neighbors, MD Outpatient Specialists: None  Brief Narrative: 70 year old male with a hx of HTN, DM, and arthritis, presented to the ED after he noticed that his stool was bloody. While in the ED, he was afebrile and hypertensive. He was admitted for further evaluation of his rectal bleed. GI was consulted and scheduled a colonoscopy for 8/28. Will likely be discharged tomorrow after colonoscopy.   Assessment & Plan:   Active Problems:   UTI (lower urinary tract infection)   IDDM (insulin dependent diabetes mellitus) (HCC)   Lower GI bleed   Rectal bleed   Rectal bleeding   1. Rectal bleed. Etiology unclear. CBC and BMP unremarkable. GI has been consulted and recommends a colonoscopy on 8/28. Continue to monitor. 2. UTI. UA only shows few bacteria. Continue rocephin. Follow up urine culture. 3. DM. Oral agents on hold. Blood sugars stable. Continue on SSI. 4. HTN. Blood pressures are running high. Will add scheduled hydralazine. 5. AKI. Creatinine improved from 1.5 to normal range. Likely related to volume depletion. Improved with IV fluids.   DVT prophylaxis: SCDs Code Status: Full Family Communication: no family present Disposition Plan: Discharge once improved, likely in AM   Consultants:   GI  Procedures:  None  Antimicrobials:  Rocephin 8/26>>   Subjective: Had a bowel movement last night that was dark brown. No abdominal pain.  Objective: Vitals:   11/28/15 1400 11/28/15 1439 11/28/15 2030 11/29/15 0600  BP: (!) 169/84  (!) 180/87 (!) 177/95  Pulse: 60  63 65  Resp: 18   18  Temp: 98.1 F (36.7 C)  97.9 F (36.6 C) 97.8 F (36.6 C)  TempSrc:   Oral Oral  SpO2: 100% 95% 95% 98%  Weight:      Height:        Intake/Output Summary (Last 24 hours) at 11/29/15 0745 Last data filed at 11/29/15 0600  Gross per 24 hour  Intake          3533.33 ml  Output              3200 ml  Net           333.33 ml   Filed Weights   11/27/15 2246 11/28/15 0346  Weight: 81.6 kg (180 lb) 81.5 kg (179 lb 11.5 oz)    Examination:   General exam: Appears calm and comfortable  Respiratory system: Clear to auscultation. Respiratory effort normal. Cardiovascular system: S1 & S2 heard, RRR. No JVD, murmurs, rubs, gallops or clicks. No pedal edema. Gastrointestinal system: Abdomen is nondistended, soft and nontender. No organomegaly or masses felt. Normal bowel sounds heard. Central nervous system: Alert and oriented. No focal neurological deficits. Extremities: Symmetric 5 x 5 power. Skin: No rashes, lesions or ulcers Psychiatry: Judgement and insight appear normal. Mood & affect appropriate.    Data Reviewed: I have personally reviewed following labs and imaging studies  CBC:  Recent Labs Lab 11/27/15 2354 11/28/15 0451 11/28/15 1737 11/29/15 0546  WBC 7.9 7.8 8.5 7.2  NEUTROABS 4.0  --   --   --   HGB 14.7 13.7 13.2 13.7  HCT 42.6 39.8 38.5* 39.9  MCV 88.0 88.2 88.7 88.3  PLT 241 221 217 [id number]   Basic Metabolic Panel:  Recent Labs Lab 11/27/15 2354 11/28/15 0451 11/29/15 0546  NA 134* 135 138  K 4.1 4.2 3.7  CL 104 102 108  CO2  24 26 22   GLUCOSE 130* 136* 125*  BUN 22* 21* 12  CREATININE 1.54* 1.47* 1.16  CALCIUM 8.7* 9.1 8.3*   GFR: Estimated Creatinine Clearance: 62.2 mL/min (by C-G formula based on SCr of 1.16 mg/dL). Liver Function Tests:  Recent Labs Lab 11/27/15 2354 11/28/15 0451  AST 17 16  ALT 13* 13*  ALKPHOS 55 49  BILITOT 0.7 0.7  PROT 7.4 6.9  ALBUMIN 4.3 4.0   CBG:  Recent Labs Lab 11/28/15 0741 11/28/15 1209 11/28/15 1635 11/28/15 2034 11/29/15 0734  GLUCAP 142* 209* 87 109* 127*   Urine analysis:    Component Value Date/Time   COLORURINE YELLOW 11/27/2015 [place] 11/27/2015 2315   LABSPEC <1.005 (L) 11/27/2015 2315   PHURINE 6.0 11/27/2015 2315   GLUCOSEU NEGATIVE 11/27/2015 2315    HGBUR SMALL (A) 11/27/2015 2315   BILIRUBINUR NEGATIVE 11/27/2015 2315   KETONESUR NEGATIVE 11/27/2015 2315   PROTEINUR NEGATIVE 11/27/2015 2315   UROBILINOGEN 0.2 03/24/2014 1320   NITRITE NEGATIVE 11/27/2015 2315   LEUKOCYTESUR LARGE (A) 11/27/2015 2315   Sepsis Labs: @LABRCNTIP (procalcitonin:4,lacticidven:4)  )No results found for this or any previous visit (from the past 240 hour(s)).    Radiology Studies: No results found.   Scheduled Meds: . amLODipine  10 mg Oral QHS  . cefTRIAXone (ROCEPHIN)  IV  1 g Intravenous Q24H  . insulin aspart  0-9 Units Subcutaneous TID WC  . polyethylene glycol-electrolytes  2,000 mL Oral Once  . [START ON 11/30/2015] polyethylene glycol-electrolytes  2,000 mL Oral Once   Continuous Infusions: . sodium chloride 100 mL/hr at 11/28/15 1403     LOS: 1 day   Time spent: 25 minutes  Kathie Dike, MD [facility] If 7PM-7AM, please contact night-coverage [email or web address] Password [facility] 11/29/2015, 7:45 AM

## 2015-11-29 NOTE — Progress Notes (Signed)
Patient denies bleeding since I saw him yesterday. Denies abdominal pain.  Hemoglobin remains normal at 13.7  Vital signs in last 24 hours: Temp:  [97.8 F (36.6 C)-98.1 F (36.7 C)] 97.8 F (36.6 C) (08/27 0600) Pulse Rate:  [60-65] 65 (08/27 0600) Resp:  [18] 18 (08/27 0600) BP: (169-180)/(84-95) 177/95 (08/27 0600) SpO2:  [95 %-100 %] 98 % (08/27 0600) Last BM Date: 11/28/15 General:   Alert,   pleasant and cooperative in NAD Abdomen:  Soft, nontender and nondistended.  Normal bowel sounds, without guarding, and without rebound.  No mass or organomegaly. Extremities:  Without clubbing or edema.    Intake/Output from previous day: 08/26 0701 - 08/27 0700 In: 3533.3 [P.O.:1200; I.V.:2283.3; IV Piggyback:50] Out: 3200 [Urine:2100; Stool:1100] Intake/Output this shift: No intake/output data recorded.  Lab Results:  Recent Labs  11/28/15 0451 11/28/15 1737 11/29/15 0546  WBC 7.8 8.5 7.2  HGB 13.7 13.2 13.7  HCT 39.8 38.5* 39.9  PLT 221 217 230   BMET  Recent Labs  11/27/15 2354 11/28/15 0451 11/29/15 0546  NA 134* 135 138  K 4.1 4.2 3.7  CL 104 102 108  CO2 24 26 22   GLUCOSE 130* 136* 125*  BUN 22* 21* 12  CREATININE 1.54* 1.47* 1.16  CALCIUM 8.7* 9.1 8.3*   LFT  Recent Labs  11/28/15 0451  PROT 6.9  ALBUMIN 4.0  AST 16  ALT 13*  ALKPHOS 49  BILITOT 0.7     Impression:    Pleasan 70 year old gentleman admitted with apparent self-limiting painless hematochezia. Hemoglobin remains normal. Well over 10 years since he had a colonoscopy.   Recommendations:   Diagnostic colonoscopy tomorow. Again discussed risk and benefits. Further recommendations to follow.

## 2015-11-30 ENCOUNTER — Encounter (HOSPITAL_COMMUNITY)
Admission: EM | Disposition: A | Discharge status: Home / Self Care | Payer: Self-pay | Source: Home / Self Care | Attending: Internal Medicine

## 2015-11-30 ENCOUNTER — Inpatient Hospital Stay (HOSPITAL_COMMUNITY): Payer: PPO | Admitting: Anesthesiology

## 2015-11-30 ENCOUNTER — Encounter (HOSPITAL_COMMUNITY): Payer: Self-pay | Admitting: *Deleted

## 2015-11-30 DIAGNOSIS — K5730 Diverticulosis of large intestine without perforation or abscess without bleeding: Secondary | ICD-10-CM | POA: Diagnosis not present

## 2015-11-30 DIAGNOSIS — D128 Benign neoplasm of rectum: Secondary | ICD-10-CM

## 2015-11-30 DIAGNOSIS — D122 Benign neoplasm of ascending colon: Secondary | ICD-10-CM | POA: Diagnosis not present

## 2015-11-30 DIAGNOSIS — G4730 Sleep apnea, unspecified: Secondary | ICD-10-CM | POA: Diagnosis not present

## 2015-11-30 DIAGNOSIS — E869 Volume depletion, unspecified: Secondary | ICD-10-CM | POA: Diagnosis not present

## 2015-11-30 DIAGNOSIS — K921 Melena: Secondary | ICD-10-CM | POA: Diagnosis not present

## 2015-11-30 DIAGNOSIS — N179 Acute kidney failure, unspecified: Secondary | ICD-10-CM

## 2015-11-30 DIAGNOSIS — K621 Rectal polyp: Secondary | ICD-10-CM | POA: Diagnosis not present

## 2015-11-30 DIAGNOSIS — E119 Type 2 diabetes mellitus without complications: Secondary | ICD-10-CM | POA: Diagnosis not present

## 2015-11-30 DIAGNOSIS — F329 Major depressive disorder, single episode, unspecified: Secondary | ICD-10-CM | POA: Diagnosis not present

## 2015-11-30 DIAGNOSIS — I10 Essential (primary) hypertension: Secondary | ICD-10-CM | POA: Diagnosis not present

## 2015-11-30 DIAGNOSIS — Z87891 Personal history of nicotine dependence: Secondary | ICD-10-CM | POA: Diagnosis not present

## 2015-11-30 DIAGNOSIS — K922 Gastrointestinal hemorrhage, unspecified: Secondary | ICD-10-CM | POA: Diagnosis not present

## 2015-11-30 DIAGNOSIS — N390 Urinary tract infection, site not specified: Secondary | ICD-10-CM | POA: Diagnosis not present

## 2015-11-30 DIAGNOSIS — K625 Hemorrhage of anus and rectum: Secondary | ICD-10-CM | POA: Diagnosis not present

## 2015-11-30 HISTORY — PX: POLYPECTOMY: SHX5525

## 2015-11-30 HISTORY — PX: COLONOSCOPY WITH PROPOFOL: SHX5780

## 2015-11-30 LAB — URINE CULTURE

## 2015-11-30 LAB — GLUCOSE, CAPILLARY
GLUCOSE-CAPILLARY: 109 mg/dL — AB (ref 65–99)
GLUCOSE-CAPILLARY: 122 mg/dL — AB (ref 65–99)
GLUCOSE-CAPILLARY: 104 mg/dL — AB (ref 65–99)
Glucose-Capillary: 78 mg/dL (ref 65–99)
Glucose-Capillary: 135 mg/dL — ABNORMAL HIGH (ref 65–99)

## 2015-11-30 SURGERY — COLONOSCOPY WITH PROPOFOL
Anesthesia: Monitor Anesthesia Care

## 2015-11-30 MED ORDER — MIDAZOLAM HCL 2 MG/2ML IJ SOLN
INTRAMUSCULAR | Status: AC
  Filled 2015-11-30: qty 2

## 2015-11-30 MED ORDER — AMLODIPINE BESYLATE 10 MG PO TABS: 10.0000 mg | ORAL_TABLET | Freq: Every day | ORAL | 0 refills | Status: DC

## 2015-11-30 MED ORDER — CIPROFLOXACIN HCL 250 MG PO TABS: 250.0000 mg | ORAL_TABLET | Freq: Two times a day (BID) | ORAL | 0 refills | Status: DC

## 2015-11-30 MED ORDER — LACTATED RINGERS IV SOLN
INTRAVENOUS | Status: DC
  Administered 2015-11-30: 09:00:00 via INTRAVENOUS

## 2015-11-30 MED ORDER — FENTANYL CITRATE (PF) 100 MCG/2ML IJ SOLN
INTRAMUSCULAR | Status: AC
  Filled 2015-11-30: qty 2

## 2015-11-30 MED ORDER — HYDRALAZINE HCL 25 MG PO TABS: 25.0000 mg | ORAL_TABLET | Freq: Three times a day (TID) | ORAL | 0 refills | Status: DC

## 2015-11-30 MED ORDER — HYDROMORPHONE HCL 1 MG/ML IJ SOLN: 0.2500 mg | INTRAMUSCULAR | Status: DC | PRN

## 2015-11-30 MED ORDER — FENTANYL CITRATE (PF) 100 MCG/2ML IJ SOLN
25.0000 ug | INTRAMUSCULAR | Status: AC | PRN
  Administered 2015-11-30 (×2): 25 ug via INTRAVENOUS

## 2015-11-30 MED ORDER — MIDAZOLAM HCL 2 MG/2ML IJ SOLN
1.0000 mg | INTRAMUSCULAR | Status: DC | PRN
  Administered 2015-11-30: 2 mg via INTRAVENOUS

## 2015-11-30 MED ORDER — SIMETHICONE 40 MG/0.6ML PO SUSP
ORAL | Status: DC | PRN
  Administered 2015-11-30: 200 mL

## 2015-11-30 MED ORDER — PROPOFOL 10 MG/ML IV BOLUS
INTRAVENOUS | Status: AC
  Filled 2015-11-30: qty 20

## 2015-11-30 MED ORDER — PROPOFOL 500 MG/50ML IV EMUL
INTRAVENOUS | Status: DC | PRN
  Administered 2015-11-30: 150 ug/kg/min via INTRAVENOUS

## 2015-11-30 MED ORDER — MIDAZOLAM HCL 5 MG/5ML IJ SOLN
INTRAMUSCULAR | Status: DC | PRN
  Administered 2015-11-30: 2 mg via INTRAVENOUS

## 2015-11-30 NOTE — Care Management Note (Signed)
Case Management Note  Patient Details  Name: SEVAG DIEBEL MRN: [id number] Date of Birth: 1945-09-26  Subjective/Objective: Patient adm from [facility], plans to return at discharge. Adm with GI Bleed, endoscopy today.   Action/Plan: Plan to DC back to ALF when appropriate, CSW aware and making arrangements.    Expected Discharge Date:  11/29/15               Expected Discharge Plan:  Assisted Living / Rest Home  In-House Referral:  Clinical Social Work  Discharge planning Services  CM Consult  Post Acute Care Choice:  NA Choice offered to:  NA  DME Arranged:    DME Agency:     HH Arranged:    [place] Agency:     Status of Service:  Completed, signed off  If discussed at [employer], dates discussed:    Additional Comments:  Jaiel Saraceno, [name], RN 11/30/2015, 12:09 PM

## 2015-11-30 NOTE — Anesthesia Postprocedure Evaluation (Signed)
Anesthesia Post Note  Patient: Albert Garrison  Procedure(s) Performed: Procedure(s) (LRB): COLONOSCOPY WITH PROPOFOL (N/A) POLYPECTOMY  Patient location during evaluation: PACU Anesthesia Type: MAC Level of consciousness: awake and alert and oriented Pain management: pain level controlled Vital Signs Assessment: post-procedure vital signs reviewed and stable Respiratory status: spontaneous breathing Cardiovascular status: stable Postop Assessment: no signs of nausea or vomiting Anesthetic complications: no    Last Vitals:  Vitals:   11/30/15 0709 11/30/15 0856  BP: (!) 122/57   Pulse: 73   Resp: 16   Temp: 36.4 C 36.6 C    Last Pain:  Vitals:   11/30/15 0856  TempSrc: Oral  PainSc:                  Tylik Treese A

## 2015-11-30 NOTE — Transfer of Care (Signed)
Immediate Anesthesia Transfer of Care Note  Patient: Albert Garrison  Procedure(s) Performed: Procedure(s) with comments: COLONOSCOPY WITH PROPOFOL (N/A) POLYPECTOMY - polyp at ascending colon, rectal polyp  Patient Location: PACU  Anesthesia Type:MAC  Level of Consciousness: awake, alert , oriented and patient cooperative  Airway & Oxygen Therapy: Patient Spontanous Breathing and Patient connected to nasal cannula oxygen  Post-op Assessment: Report given to RN and Post -op Vital signs reviewed and stable  Post vital signs: Reviewed and stable  Last Vitals:  Vitals:   11/30/15 0709 11/30/15 0856  BP: (!) 122/57   Pulse: 73   Resp: 16   Temp: 36.4 C 36.6 C    Last Pain:  Vitals:   11/30/15 0856  TempSrc: Oral  PainSc:       Patients Stated Pain Goal: 2 ([id number])  Complications: No apparent anesthesia complications

## 2015-11-30 NOTE — Discharge Summary (Signed)
Physician Discharge Summary  Albert Garrison [id number] DOB: 23-Jun-1945 DOA: 11/27/2015  PCP: Wende Neighbors, MD  Admit date: 11/27/2015 Discharge date: 11/30/2015  Admitted From: Home  Disposition:  Home   Recommendations for Outpatient Follow-up:  1. Follow up with PCP in 1-2 weeks 2. Please obtain BMP/CBC in one week 3. Follow up GI for pathology results 4. Hydralazine added to antihypertensive regimen   Home Health: No Equipment/Devices: None   Discharge Condition: Stable  CODE STATUS: Full  Diet recommendation: Heart healthy diabetic diet   Discharge Diagnoses:  Active Problems:   UTI (lower urinary tract infection)   IDDM (insulin dependent diabetes mellitus) (HCC)   Lower GI bleed   Rectal bleed   Rectal bleeding   AKI (acute kidney injury) ([place])  Brief/Interim Summary: Pt presented with complaints of hematochezia. During evaluation he was noted to have a rectal bleed. Hemoglobin remained stable through hospital course. GI was consulted and since it had been >10 years since he had a colonoscopy, it was recommended that patient be evaluated with TCS. Pt had a colonoscopy 8/28 that showed diverticuli and polyps that were removed. It was felt that his bleeding may have been related to polyp. No evidence of ongoing bleeding. Patient will follow up with GI for biopsy results and that that time, timing of next colonoscopy could be determined. On discharge he was noted to have improved. 1. UTI. Urine culture positive for serratia marcescens. Will change rocephin to cipro to complete course. 2. DM. Oral agents were held. He remained stable. Resume on discharge 3. HTN. Blood pressures ran high but have since improved. Hydralazine added to antihypertensive regimen. Continue amlodipine.  4. AKI. Creatinine improved from 1.5 to normal range. Likely related to volume depletion. Improved with IV fluids.   Discharge Instructions  Discharge Instructions    Diet - low sodium heart  healthy    Complete by:  As directed   Increase activity slowly    Complete by:  As directed       Medication List    TAKE these medications   amLODipine 10 MG tablet Commonly known as:  NORVASC Take 1 tablet (10 mg total) by mouth at bedtime.   ciprofloxacin 250 MG tablet Commonly known as:  CIPRO Take 1 tablet (250 mg total) by mouth 2 (two) times daily.   glipiZIDE 10 MG 24 hr tablet Commonly known as:  GLUCOTROL XL Take 10 mg by mouth 2 (two) times daily.   hydrALAZINE 25 MG tablet Commonly known as:  APRESOLINE Take 1 tablet (25 mg total) by mouth 3 (three) times daily.   pioglitazone 15 MG tablet Commonly known as:  ACTOS Take 15 mg by mouth daily.      Follow-up Information    Manus Rudd, MD. Schedule an appointment as soon as possible for a visit in 2 week(s).   Specialty:  Gastroenterology Contact information: [place] [address] [phone]        Wende Neighbors, MD. Schedule an appointment as soon as possible for a visit in 2 week(s).   Specialty:  Internal Medicine Contact information: [place] [address] [phone]          No Known Allergies  Consultations:  GI   Procedures/Studies:  Colonoscopy 8/28 - One 10 mm polyp in the rectum, removed with a hot snare. Resected and retrieved. - Diverticulosis in the sigmoid colon and in the descending colon. - One 4 mm polyp in the ascending colon, removed  with a cold snare. Resected and retrieved. - The examination was otherwise normal on direct and retroflexion views. The larger polyp in the rectum could have contributed to some rectal bleeding.   Subjective: No further bleeding. No abdominal pain  Discharge Exam: Vitals:   11/30/15 1030 11/30/15 1052  BP: 138/76 137/65  Pulse: (!) 57 60  Resp: (!) 4   Temp:  98.1 F (36.7 C)   Vitals:   11/30/15 1010 11/30/15 1015 11/30/15 1030 11/30/15 1052  BP: 121/66 130/76 138/76 137/65  Pulse: 66 (!)  56 (!) 57 60  Resp: 19 10 (!) 4   Temp: 97.6 F (36.4 C)   98.1 F (36.7 C)  TempSrc:    Oral  SpO2: 98% 96% 97% 99%  Weight:      Height:        General: Pt is alert, awake, not in acute distress Cardiovascular: RRR, S1/S2 +, no rubs, no gallops Respiratory: CTA bilaterally, no wheezing, no rhonchi Abdominal: Soft, NT, ND, bowel sounds + Extremities: no edema, no cyanosis    The results of significant diagnostics from this hospitalization (including imaging, microbiology, ancillary and laboratory) are listed below for reference.     Microbiology: Recent Results (from the past 240 hour(s))  Urine culture     Status: Abnormal   Collection Time: 11/27/15 11:15 PM  Result Value Ref Range Status   Specimen Description URINE, CLEAN CATCH  Final   Special Requests NONE  Final   Culture >=100,000 COLONIES/mL SERRATIA MARCESCENS (A)  Final   Report Status 11/30/2015 FINAL  Final   Organism ID, Bacteria SERRATIA MARCESCENS (A)  Final      Susceptibility   Serratia marcescens - MIC*    CEFAZOLIN >=64 RESISTANT Resistant     CEFTRIAXONE <=1 SENSITIVE Sensitive     CIPROFLOXACIN 1 SENSITIVE Sensitive     GENTAMICIN <=1 SENSITIVE Sensitive     NITROFURANTOIN >=512 RESISTANT Resistant     TRIMETH/SULFA 80 RESISTANT Resistant     * >=100,000 COLONIES/mL SERRATIA MARCESCENS     Labs: BNP (last 3 results) No results for input(s): BNP in the last 8760 hours. Basic Metabolic Panel:  Recent Labs Lab 11/27/15 2354 11/28/15 0451 11/29/15 0546  NA 134* 135 138  K 4.1 4.2 3.7  CL 104 102 108  CO2 24 26 22   GLUCOSE 130* 136* 125*  BUN 22* 21* 12  CREATININE 1.54* 1.47* 1.16  CALCIUM 8.7* 9.1 8.3*   Liver Function Tests:  Recent Labs Lab 11/27/15 2354 11/28/15 0451  AST 17 16  ALT 13* 13*  ALKPHOS 55 49  BILITOT 0.7 0.7  PROT 7.4 6.9  ALBUMIN 4.3 4.0   No results for input(s): LIPASE, AMYLASE in the last 168 hours. No results for input(s): AMMONIA in the last 168  hours. CBC:  Recent Labs Lab 11/27/15 2354 11/28/15 0451 11/28/15 1737 11/29/15 0546  WBC 7.9 7.8 8.5 7.2  NEUTROABS 4.0  --   --   --   HGB 14.7 13.7 13.2 13.7  HCT 42.6 39.8 38.5* 39.9  MCV 88.0 88.2 88.7 88.3  PLT 241 221 217 230   Cardiac Enzymes: No results for input(s): CKTOTAL, CKMB, CKMBINDEX, TROPONINI in the last 168 hours. BNP: Invalid input(s): POCBNP CBG:  Recent Labs Lab 11/29/15 2109 11/30/15 0738 11/30/15 0902 11/30/15 1013 11/30/15 1144  GLUCAP 78 109* 135* 122* 104*   D-Dimer No results for input(s): DDIMER in the last 72 hours. Hgb A1c  Recent Labs  11/28/15  0451  HGBA1C 6.6*   Lipid Profile No results for input(s): CHOL, HDL, LDLCALC, TRIG, CHOLHDL, LDLDIRECT in the last 72 hours. Thyroid function studies No results for input(s): TSH, T4TOTAL, T3FREE, THYROIDAB in the last 72 hours.  Invalid input(s): FREET3 Anemia work up No results for input(s): VITAMINB12, FOLATE, FERRITIN, TIBC, IRON, RETICCTPCT in the last 72 hours. Urinalysis    Component Value Date/Time   COLORURINE YELLOW 11/27/2015 [place] 11/27/2015 2315   LABSPEC <1.005 (L) 11/27/2015 2315   PHURINE 6.0 11/27/2015 2315   GLUCOSEU NEGATIVE 11/27/2015 2315   HGBUR SMALL (A) 11/27/2015 2315   BILIRUBINUR NEGATIVE 11/27/2015 2315   KETONESUR NEGATIVE 11/27/2015 2315   PROTEINUR NEGATIVE 11/27/2015 2315   UROBILINOGEN 0.2 03/24/2014 1320   NITRITE NEGATIVE 11/27/2015 2315   LEUKOCYTESUR LARGE (A) 11/27/2015 2315   Sepsis Labs Invalid input(s): PROCALCITONIN,  WBC,  LACTICIDVEN Microbiology Recent Results (from the past 240 hour(s))  Urine culture     Status: Abnormal   Collection Time: 11/27/15 11:15 PM  Result Value Ref Range Status   Specimen Description URINE, CLEAN CATCH  Final   Special Requests NONE  Final   Culture >=100,000 COLONIES/mL SERRATIA MARCESCENS (A)  Final   Report Status 11/30/2015 FINAL  Final   Organism ID, Bacteria SERRATIA  MARCESCENS (A)  Final      Susceptibility   Serratia marcescens - MIC*    CEFAZOLIN >=64 RESISTANT Resistant     CEFTRIAXONE <=1 SENSITIVE Sensitive     CIPROFLOXACIN 1 SENSITIVE Sensitive     GENTAMICIN <=1 SENSITIVE Sensitive     NITROFURANTOIN >=512 RESISTANT Resistant     TRIMETH/SULFA 80 RESISTANT Resistant     * >=100,000 COLONIES/mL SERRATIA MARCESCENS     Time coordinating discharge: Over 30 minutes  SIGNED:   Kathie Dike, MD [facility] 11/30/2015, 1:31 PM If 7PM-7AM, please contact night-coverage [email or web address] Password [email or web address]

## 2015-11-30 NOTE — Care Management Important Message (Signed)
Important Message  Patient Details  Name: Albert Garrison MRN: [id number] Date of Birth: 1945-09-28   Medicare Important Message Given:  Yes    Quavon Keisling, [name], RN 11/30/2015, 12:12 PM

## 2015-11-30 NOTE — Progress Notes (Signed)
Discharge instructions given, pt verbalizes understanding. IV removed, pt taking down for discharge via wheelchair.

## 2015-11-30 NOTE — Progress Notes (Signed)
Patient seen briefly today. On schedule for colonoscopy. Completely prep and did well. Stools running clear. Ordered 2 tap water enemas. NPO. No questions regarding procedure. Understands risks, benefits, and ready to proceed .  Albert Garrison, ANP-BC [facility]

## 2015-11-30 NOTE — Anesthesia Preprocedure Evaluation (Signed)
Anesthesia Evaluation  Patient identified by MRN, date of birth, ID band Patient awake    Reviewed: Allergy & Precautions, NPO status , Patient's Chart, lab work & pertinent test results  Airway Mallampati: II  TM Distance: >3 FB Neck ROM: Full    Dental  (+) Poor Dentition   Pulmonary sleep apnea , pneumonia, former smoker,    breath sounds clear to auscultation       Cardiovascular hypertension, Pt. on medications  Rhythm:Regular Rate:Normal     Neuro/Psych PSYCHIATRIC DISORDERS Depression    GI/Hepatic Neg liver ROS, GERD  ,  Endo/Other  diabetes, Type 2, Insulin Dependent  Renal/GU Renal disease     Musculoskeletal   Abdominal   Peds  Hematology   Anesthesia Other Findings   Reproductive/Obstetrics                             Anesthesia Physical Anesthesia Plan  ASA: III  Anesthesia Plan: MAC   Post-op Pain Management:    Induction: Intravenous  Airway Management Planned: Simple Face Mask  Additional Equipment:   Intra-op Plan:   Post-operative Plan:   Informed Consent: I have reviewed the patients History and Physical, chart, labs and discussed the procedure including the risks, benefits and alternatives for the proposed anesthesia with the patient or authorized representative who has indicated his/her understanding and acceptance.     Plan Discussed with:   Anesthesia Plan Comments:         Anesthesia Quick Evaluation

## 2015-11-30 NOTE — Op Note (Signed)
[facility] Patient Name: Albert Garrison Procedure Date: 11/30/2015 9:38 AM MRN: [id number] Date of Birth: [birth year] Attending MD: Norvel Richards , MD CSN: [id number] Age: 70 Admit Type: Inpatient Procedure:                Colonoscopy with multiple snare polypectomies Indications:              Hematochezia Providers:                Norvel Richards, MD, Rosina Lowenstein, RN, [name] Mattocks, Technician Referring MD:              Medicines:                Propofol per Anesthesia Complications:            No immediate complications. Estimated Blood Loss:     Estimated blood loss was minimal. Procedure:                Pre-Anesthesia Assessment:                           - Prior to the procedure, a History and Physical                            was performed, and patient medications and                            allergies were reviewed. The patient's tolerance of                            previous anesthesia was also reviewed. The risks                            and benefits of the procedure and the sedation                            options and risks were discussed with the patient.                            All questions were answered, and informed consent                            was obtained. Prior Anticoagulants: The patient has                            taken no previous anticoagulant or antiplatelet                            agents. ASA Grade Assessment: II - A patient with                            mild systemic disease. After reviewing the risks  and benefits, the patient was deemed in                            satisfactory condition to undergo the procedure.                           After obtaining informed consent, the colonoscope                            was passed under direct vision. Throughout the                            procedure, the patient's blood pressure, pulse, and          oxygen saturations were monitored continuously. The                            EC38-[id number]) scope was introduced through                            the anus and advanced to the the cecum, identified                            by appendiceal orifice and ileocecal valve. The                            colonoscopy was performed without difficulty. The                            patient tolerated the procedure well. The quality                            of the bowel preparation was adequate. The                            ileocecal valve, appendiceal orifice, and rectum                            were photographed. Scope In: 9:43:23 AM Scope Out: 10:00:51 AM Scope Withdrawal Time: 0 hours 13 minutes 28 seconds  Total Procedure Duration: 0 hours 17 minutes 28 seconds  Findings:      The perianal and digital rectal examinations were normal.      A 10 mm polyp was found in the rectum. The polyp was pedunculated. The       polyp was removed with a hot snare. Resection and retrieval were       complete. Estimated blood loss: none.      Many small and large-mouthed diverticula were found in the sigmoid colon       and descending colon.      (1) 4 mm polyp was found in the ascending colon. The polyp was       semi-pedunculated. The polyp was removed with a cold snare. Resection       and retrieval were complete. Estimated blood loss was minimal.      The exam was otherwise without abnormality on direct and retroflexion  views. Impression:               - One 10 mm polyp in the rectum, removed with a hot                            snare. Resected and retrieved.                           - Diverticulosis in the sigmoid colon and in the                            descending colon.                           - One 4 mm polyp in the ascending colon, removed                            with a cold snare. Resected and retrieved.                           - The examination was otherwise  normal on direct                            and retroflexion views. The larger polyp in the                            rectum could have contributed to some rectal                            bleeding. Moderate Sedation:      Moderate (conscious) sedation was personally administered by an       anesthesia professional. The following parameters were monitored: oxygen       saturation, heart rate, blood pressure, respiratory rate, EKG, adequacy       of pulmonary ventilation, and response to care. Total physician       intraservice time was 16 minutes. Recommendation:           - Return patient to hospital ward for observation.                           - Diabetic (ADA) diet.                           - Continue present medications.                           - Repeat colonoscopy date to be determined after                            pending pathology results are reviewed for                            surveillance based on pathology results.                           -  Return to GI clinic after studies are complete.                            From a GI standpoint, patient could be discharged                            later today. Procedure Code(s):        --- Professional ---                           [id number], Colonoscopy, flexible; with removal of                            tumor(s), polyp(s), or other lesion(s) by snare                            technique Diagnosis Code(s):        --- Professional ---                           K62.1, Rectal polyp                           D12.2, Benign neoplasm of ascending colon                           K92.1, Melena (includes Hematochezia)                           K57.30, Diverticulosis of large intestine without                            perforation or abscess without bleeding CPT copyright 2016 American Medical Association. All rights reserved. The codes documented in this report are preliminary and upon coder review may  be revised to meet  current compliance requirements. Albert Garrison. Betzaira Mentel, MD Norvel Richards, MD 11/30/2015 10:11:57 AM This report has been signed electronically. Number of Addenda: 0

## 2015-11-30 NOTE — Progress Notes (Signed)
Pharmacy Antibiotic Note  Albert Garrison is a 70 y.o. male admitted on 11/27/2015 with UTI.  Pharmacy has been consulted for Ceftriaxone dosing.  Plan: Ceftriaxone 1gm IV q24h F/u cultures  Monitor V/S and labs  Height: 5\' 11"  (180.3 cm) Weight: 179 lb 11.5 oz (81.5 kg) IBW/kg (Calculated) : 75.3  Temp (24hrs), Avg:97.9 F (36.6 C), Min:97.6 F (36.4 C), Max:98.1 F (36.7 C)   Recent Labs Lab 11/27/15 2354 11/28/15 0451 11/28/15 1737 11/29/15 0546  WBC 7.9 7.8 8.5 7.2  CREATININE 1.54* 1.47*  --  1.16    Estimated Creatinine Clearance: 62.2 mL/min (by C-G formula based on SCr of 1.16 mg/dL).    No Known Allergies  Antimicrobials this admission: Ceftriaxone 8/26 >>  Dose adjustments this admission: n/a  Recent Results (from the past 240 hour(s))  Urine culture     Status: Abnormal   Collection Time: 11/27/15 11:15 PM  Result Value Ref Range Status   Specimen Description URINE, CLEAN CATCH  Final   Special Requests NONE  Final   Culture >=100,000 COLONIES/mL SERRATIA MARCESCENS (A)  Final   Report Status 11/30/2015 FINAL  Final   Organism ID, Bacteria SERRATIA MARCESCENS (A)  Final      Susceptibility   Serratia marcescens - MIC*    CEFAZOLIN >=64 RESISTANT Resistant     CEFTRIAXONE <=1 SENSITIVE Sensitive     CIPROFLOXACIN 1 SENSITIVE Sensitive     GENTAMICIN <=1 SENSITIVE Sensitive     NITROFURANTOIN >=512 RESISTANT Resistant     TRIMETH/SULFA 80 RESISTANT Resistant     * >=100,000 COLONIES/mL SERRATIA MARCESCENS   Thank you for allowing pharmacy to be a part of this patient's care.  Hart Robinsons, PharmD Clinical Pharmacist Pager:  [phone] 11/30/2015

## 2015-12-02 ENCOUNTER — Encounter: Payer: Self-pay | Admitting: Internal Medicine

## 2015-12-03 ENCOUNTER — Encounter (HOSPITAL_COMMUNITY): Payer: Self-pay | Admitting: Internal Medicine

## 2015-12-09 DIAGNOSIS — N390 Urinary tract infection, site not specified: Secondary | ICD-10-CM | POA: Diagnosis not present

## 2015-12-09 DIAGNOSIS — I10 Essential (primary) hypertension: Secondary | ICD-10-CM | POA: Diagnosis not present

## 2015-12-09 DIAGNOSIS — K625 Hemorrhage of anus and rectum: Secondary | ICD-10-CM | POA: Diagnosis not present

## 2016-01-01 ENCOUNTER — Telehealth: Payer: Self-pay | Admitting: Gastroenterology

## 2016-01-01 ENCOUNTER — Encounter: Payer: Self-pay | Admitting: Gastroenterology

## 2016-01-01 ENCOUNTER — Ambulatory Visit: Payer: PPO | Admitting: Gastroenterology

## 2016-01-01 NOTE — Telephone Encounter (Signed)
Pt was a no show

## 2016-01-19 ENCOUNTER — Other Ambulatory Visit (HOSPITAL_COMMUNITY): Payer: Self-pay | Admitting: Adult Health Nurse Practitioner

## 2016-01-19 ENCOUNTER — Ambulatory Visit (HOSPITAL_COMMUNITY)
Admission: RE | Admit: 2016-01-19 | Discharge: 2016-01-19 | Disposition: A | Discharge status: Home / Self Care | Payer: PPO | Source: Ambulatory Visit | Attending: Adult Health Nurse Practitioner | Admitting: Adult Health Nurse Practitioner

## 2016-01-19 DIAGNOSIS — M7989 Other specified soft tissue disorders: Secondary | ICD-10-CM

## 2016-01-19 DIAGNOSIS — M79605 Pain in left leg: Secondary | ICD-10-CM | POA: Insufficient documentation

## 2016-01-19 DIAGNOSIS — Z6825 Body mass index (BMI) 25.0-25.9, adult: Secondary | ICD-10-CM | POA: Diagnosis not present

## 2016-03-04 DIAGNOSIS — N390 Urinary tract infection, site not specified: Secondary | ICD-10-CM | POA: Diagnosis not present

## 2016-03-04 DIAGNOSIS — R399 Unspecified symptoms and signs involving the genitourinary system: Secondary | ICD-10-CM | POA: Diagnosis not present

## 2016-03-15 DIAGNOSIS — E782 Mixed hyperlipidemia: Secondary | ICD-10-CM | POA: Diagnosis not present

## 2016-03-15 DIAGNOSIS — N182 Chronic kidney disease, stage 2 (mild): Secondary | ICD-10-CM | POA: Diagnosis not present

## 2016-03-15 DIAGNOSIS — E1122 Type 2 diabetes mellitus with diabetic chronic kidney disease: Secondary | ICD-10-CM | POA: Diagnosis not present

## 2016-07-08 DIAGNOSIS — N390 Urinary tract infection, site not specified: Secondary | ICD-10-CM | POA: Diagnosis not present

## 2016-08-08 DIAGNOSIS — M25562 Pain in left knee: Secondary | ICD-10-CM | POA: Diagnosis not present

## 2016-09-02 DIAGNOSIS — Z6826 Body mass index (BMI) 26.0-26.9, adult: Secondary | ICD-10-CM | POA: Diagnosis not present

## 2016-09-02 DIAGNOSIS — N390 Urinary tract infection, site not specified: Secondary | ICD-10-CM | POA: Diagnosis not present

## 2016-09-14 ENCOUNTER — Ambulatory Visit: Payer: PPO | Admitting: Orthopaedic Surgery

## 2016-09-14 ENCOUNTER — Encounter: Payer: Self-pay | Admitting: Orthopaedic Surgery

## 2016-10-06 ENCOUNTER — Ambulatory Visit (INDEPENDENT_AMBULATORY_CARE_PROVIDER_SITE_OTHER): Payer: Medicare Other | Admitting: Otolaryngology

## 2017-01-05 DIAGNOSIS — Z6826 Body mass index (BMI) 26.0-26.9, adult: Secondary | ICD-10-CM | POA: Diagnosis not present

## 2017-01-05 DIAGNOSIS — L2389 Allergic contact dermatitis due to other agents: Secondary | ICD-10-CM | POA: Diagnosis not present

## 2017-01-05 DIAGNOSIS — Z23 Encounter for immunization: Secondary | ICD-10-CM | POA: Diagnosis not present

## 2017-02-07 DIAGNOSIS — I10 Essential (primary) hypertension: Secondary | ICD-10-CM | POA: Diagnosis not present

## 2017-02-07 DIAGNOSIS — E1122 Type 2 diabetes mellitus with diabetic chronic kidney disease: Secondary | ICD-10-CM | POA: Diagnosis not present

## 2017-02-07 DIAGNOSIS — E782 Mixed hyperlipidemia: Secondary | ICD-10-CM | POA: Diagnosis not present

## 2017-02-09 DIAGNOSIS — H538 Other visual disturbances: Secondary | ICD-10-CM | POA: Diagnosis not present

## 2017-02-09 DIAGNOSIS — Z0000 Encounter for general adult medical examination without abnormal findings: Secondary | ICD-10-CM | POA: Diagnosis not present

## 2017-02-09 DIAGNOSIS — E1165 Type 2 diabetes mellitus with hyperglycemia: Secondary | ICD-10-CM | POA: Diagnosis not present

## 2017-02-09 DIAGNOSIS — E785 Hyperlipidemia, unspecified: Secondary | ICD-10-CM | POA: Diagnosis not present

## 2017-02-09 DIAGNOSIS — Z1331 Encounter for screening for depression: Secondary | ICD-10-CM | POA: Diagnosis not present

## 2017-02-09 DIAGNOSIS — Z6824 Body mass index (BMI) 24.0-24.9, adult: Secondary | ICD-10-CM | POA: Diagnosis not present

## 2017-02-09 DIAGNOSIS — R944 Abnormal results of kidney function studies: Secondary | ICD-10-CM | POA: Diagnosis not present

## 2017-03-04 DIAGNOSIS — N411 Chronic prostatitis: Secondary | ICD-10-CM | POA: Diagnosis not present

## 2017-03-04 DIAGNOSIS — R350 Frequency of micturition: Secondary | ICD-10-CM | POA: Diagnosis not present

## 2017-03-17 DIAGNOSIS — M25531 Pain in right wrist: Secondary | ICD-10-CM | POA: Diagnosis not present

## 2017-03-20 DIAGNOSIS — M654 Radial styloid tenosynovitis [de Quervain]: Secondary | ICD-10-CM | POA: Diagnosis not present

## 2017-03-20 DIAGNOSIS — M25531 Pain in right wrist: Secondary | ICD-10-CM | POA: Diagnosis not present

## 2017-04-10 DIAGNOSIS — M25531 Pain in right wrist: Secondary | ICD-10-CM | POA: Diagnosis not present

## 2017-04-10 DIAGNOSIS — Z6825 Body mass index (BMI) 25.0-25.9, adult: Secondary | ICD-10-CM | POA: Diagnosis not present

## 2017-04-12 ENCOUNTER — Ambulatory Visit (INDEPENDENT_AMBULATORY_CARE_PROVIDER_SITE_OTHER): Payer: PPO

## 2017-04-12 ENCOUNTER — Encounter: Payer: Self-pay | Admitting: Orthopaedic Surgery

## 2017-04-12 ENCOUNTER — Ambulatory Visit: Payer: PPO | Admitting: Orthopaedic Surgery

## 2017-04-12 VITALS — BP 192/101 | HR 62 | Ht 71.0 in | Wt 189.0 lb

## 2017-04-12 DIAGNOSIS — M778 Other enthesopathies, not elsewhere classified: Secondary | ICD-10-CM | POA: Diagnosis not present

## 2017-04-12 DIAGNOSIS — M779 Enthesopathy, unspecified: Secondary | ICD-10-CM

## 2017-04-12 DIAGNOSIS — M25531 Pain in right wrist: Secondary | ICD-10-CM

## 2017-04-12 NOTE — Progress Notes (Signed)
Subjective:    Patient ID: Albert Garrison, male    DOB: Apr 26, 1945, 72 y.o.   MRN: [id number]  HPI He has had pain of the right wrist and right thumb for several weeks.  He has seen Dr. Delphina Cahill for this.  He has had prednisone, rubs, rest, ice and heat as well as wrist splint.  They have all helped some but he still has pain in movement of the right thumb.  He has no redness, no swelling, no numbness, no trauma.  Review of Systems  HENT: Negative for congestion.   Respiratory: Negative for cough and shortness of breath.   Cardiovascular: Negative for chest pain and leg swelling.  Musculoskeletal: Positive for arthralgias.  All other systems reviewed and are negative.  Past Medical History:  Diagnosis Date  . Arthritis   . Complicated UTI (urinary tract infection) Dec.2015  . Depression    hx. of  . Diabetes mellitus without complication (HCC)    diet controlled  . Foley catheter in place 03/20/2014  . GERD (gastroesophageal reflux disease)    occasionally  . Heart murmur    hx. of at 17 yrs. old  . Hypertension   . Pneumonia    hx. of  . Rheumatic fever    when 4 yrs. old  . Sleep apnea    does not use c-pap machine  . Sodium (Na) deficiency    managed in nursing center12/2015-Feb-2016    Past Surgical History:  Procedure Laterality Date  . CARPAL TUNNEL RELEASE Left 2010  . CERVICAL SPINE SURGERY  2010  . COLONOSCOPY WITH PROPOFOL N/A 11/30/2015   Procedure: COLONOSCOPY WITH PROPOFOL;  Surgeon: Daneil Dolin, MD;  Location: [facility];  Service: Endoscopy;  Laterality: N/A;  . KNEE ARTHROSCOPY  1989  . LUMBAR LAMINECTOMY  1986  . Nose surgery - broken nose    . POLYPECTOMY  11/30/2015   Procedure: POLYPECTOMY;  Surgeon: Daneil Dolin, MD;  Location: [facility];  Service: Endoscopy;;  polyp at ascending colon, rectal polyp  . TRANSURETHRAL INCISION OF PROSTATE N/A 01/20/2015   Procedure: TRANSURETHRAL INCISION OF THE PROSTATE (TUIP);  Surgeon: Irine Seal,  MD;  Location: [facility];  Service: Urology;  Laterality: N/A;    Current Outpatient Medications on File Prior to Visit  Medication Sig Dispense Refill  . amLODipine (NORVASC) 10 MG tablet Take 1 tablet (10 mg total) by mouth at bedtime. (Patient not taking: Reported on 04/12/2017) 30 tablet 0  . glipiZIDE (GLUCOTROL XL) 10 MG 24 hr tablet Take 10 mg by mouth 2 (two) times daily.     . hydrALAZINE (APRESOLINE) 25 MG tablet Take 1 tablet (25 mg total) by mouth 3 (three) times daily. (Patient not taking: Reported on 04/12/2017) 90 tablet 0  . pioglitazone (ACTOS) 15 MG tablet Take 15 mg by mouth daily.     No current facility-administered medications on file prior to visit.     Social History   Socioeconomic History  . Marital status: Divorced    Spouse name: Not on file  . Number of children: Not on file  . Years of education: Not on file  . Highest education level: Not on file  Social Needs  . Financial resource strain: Not on file  . Food insecurity - worry: Not on file  . Food insecurity - inability: Not on file  . Transportation needs - medical: Not on file  . Transportation needs - non-medical: Not on file  Occupational  History  . Not on file  Tobacco Use  . Smoking status: Former Smoker    Last attempt to quit: 08/06/1975    Years since quitting: 41.7  . Smokeless tobacco: Never Used  Substance and Sexual Activity  . Alcohol use: Yes    Alcohol/week: 1.8 oz    Types: 3 Cans of beer per week    Comment: 3 12 oz cans of beer each week.None since 03/2014  . Drug use: No  . Sexual activity: Yes    Birth control/protection: None  Other Topics Concern  . Not on file  Social History Narrative  . Not on file    History reviewed. No pertinent family history.  BP (!) 192/101   Pulse 62   Ht 5\' 11"  (1.803 m)   Wt 189 lb (85.7 kg)   BMI 26.36 kg/m       Objective:   Physical Exam  Constitutional: He is oriented to person, place, and time. He appears well-developed and  well-nourished.  HENT:  Head: Normocephalic and atraumatic.  Eyes: Conjunctivae and EOM are normal. Pupils are equal, round, and reactive to light.  Neck: Normal range of motion. Neck supple.  Cardiovascular: Normal rate, regular rhythm and intact distal pulses.  Pulmonary/Chest: Effort normal.  Abdominal: Soft.  Musculoskeletal: He exhibits tenderness (Right thumb tender with motion, pain in the first compartment dorsally, limited motion thumb, NV intact, positive Finkelstein, left hand negative.).  Neurological: He is alert and oriented to person, place, and time. He has normal reflexes. He displays normal reflexes. No cranial nerve deficit. He exhibits normal muscle tone. Coordination normal.  Skin: Skin is warm and dry.  Psychiatric: He has a normal mood and affect. His behavior is normal. Judgment and thought content normal.  Vitals reviewed.   X-rays were done of the right wrist, reported separately.      Assessment & Plan:   Encounter Diagnoses  Name Primary?  . Right wrist pain Yes  . Tendinitis of thumb    Procedure note:  After permission from the patient and sterile prep, the first extensor compartment area was injected with 1% xylocaine and 1 cc of DepoMedrol 40 by sterile technique and tolerated well.  A thumb splint has been given.  Return in two weeks.  Call if any problem.  Precautions discussed.   Electronically Signed Sanjuana Kava, MD 1/9/20199:32 AM

## 2017-04-26 ENCOUNTER — Encounter: Payer: Self-pay | Admitting: Orthopaedic Surgery

## 2017-04-26 ENCOUNTER — Ambulatory Visit: Payer: PPO | Admitting: Orthopaedic Surgery

## 2017-04-26 VITALS — BP 157/92 | HR 61 | Ht 71.0 in | Wt 189.0 lb

## 2017-04-26 DIAGNOSIS — M25531 Pain in right wrist: Secondary | ICD-10-CM

## 2017-04-26 DIAGNOSIS — M778 Other enthesopathies, not elsewhere classified: Secondary | ICD-10-CM | POA: Diagnosis not present

## 2017-04-26 DIAGNOSIS — M779 Enthesopathy, unspecified: Principal | ICD-10-CM

## 2017-04-26 NOTE — Progress Notes (Signed)
Patient Albert Garrison, male DOB:12-Aug-1945, 72 y.o. YNW:[id number]  Chief Complaint  Patient presents with  . Follow-up    right wrist    HPI  Albert Garrison is a 72 y.o. male who has DeQuervain's of the right wrist.  He has been wearing his brace.  He has less pain but still has pain if he does not use the brace.  He has no swelling now.  He is taking his medicine.  I have advised getting some Aspercreme and rub on the area tid. HPI  Body mass index is 26.36 kg/m.  ROS  Review of Systems  HENT: Negative for congestion.   Respiratory: Negative for cough and shortness of breath.   Cardiovascular: Negative for chest pain and leg swelling.  Musculoskeletal: Positive for arthralgias.  All other systems reviewed and are negative.   Past Medical History:  Diagnosis Date  . Arthritis   . Complicated UTI (urinary tract infection) Dec.2015  . Depression    hx. of  . Diabetes mellitus without complication (HCC)    diet controlled  . Foley catheter in place 03/20/2014  . GERD (gastroesophageal reflux disease)    occasionally  . Heart murmur    hx. of at 17 yrs. old  . Hypertension   . Pneumonia    hx. of  . Rheumatic fever    when 4 yrs. old  . Sleep apnea    does not use c-pap machine  . Sodium (Na) deficiency    managed in nursing center12/2015-Feb-2016    Past Surgical History:  Procedure Laterality Date  . CARPAL TUNNEL RELEASE Left 2010  . CERVICAL SPINE SURGERY  2010  . COLONOSCOPY WITH PROPOFOL N/A 11/30/2015   Procedure: COLONOSCOPY WITH PROPOFOL;  Surgeon: Daneil Dolin, MD;  Location: [facility];  Service: Endoscopy;  Laterality: N/A;  . KNEE ARTHROSCOPY  1989  . LUMBAR LAMINECTOMY  1986  . Nose surgery - broken nose    . POLYPECTOMY  11/30/2015   Procedure: POLYPECTOMY;  Surgeon: Daneil Dolin, MD;  Location: [facility];  Service: Endoscopy;;  polyp at ascending colon, rectal polyp  . TRANSURETHRAL INCISION OF PROSTATE N/A 01/20/2015   Procedure: TRANSURETHRAL INCISION OF THE PROSTATE (TUIP);  Surgeon: Irine Seal, MD;  Location: [facility];  Service: Urology;  Laterality: N/A;    History reviewed. No pertinent family history.  Social History Social History   Tobacco Use  . Smoking status: Former Smoker    Last attempt to quit: 08/06/1975    Years since quitting: 41.7  . Smokeless tobacco: Never Used  Substance Use Topics  . Alcohol use: Yes    Alcohol/week: 1.8 oz    Types: 3 Cans of beer per week    Comment: 3 12 oz cans of beer each week.None since 03/2014  . Drug use: No    No Known Allergies  Current Outpatient Medications  Medication Sig Dispense Refill  . amLODipine (NORVASC) 10 MG tablet Take 1 tablet (10 mg total) by mouth at bedtime. (Patient not taking: Reported on 04/12/2017) 30 tablet 0  . glipiZIDE (GLUCOTROL XL) 10 MG 24 hr tablet Take 10 mg by mouth 2 (two) times daily.     . hydrALAZINE (APRESOLINE) 25 MG tablet Take 1 tablet (25 mg total) by mouth 3 (three) times daily. (Patient not taking: Reported on 04/12/2017) 90 tablet 0  . pioglitazone (ACTOS) 15 MG tablet Take 15 mg by mouth daily.     No current facility-administered medications for  this visit.      Physical Exam  Blood pressure (!) 157/92, pulse 61, height 5\' 11"  (1.803 m), weight 189 lb (85.7 kg).  Constitutional: overall normal hygiene, normal nutrition, well developed, normal grooming, normal body habitus. Assistive device:thumb splint right  Musculoskeletal: gait and station Limp none, muscle tone and strength are normal, no tremors or atrophy is present.  .  Neurological: coordination overall normal.  Deep tendon reflex/nerve stretch intact.  Sensation normal.  Cranial nerves II-XII intact.   Skin:   Normal overall no scars, lesions, ulcers or rashes. No psoriasis.  Psychiatric: Alert and oriented x 3.  Recent memory intact, remote memory unclear.  Normal mood and affect. Well groomed.  Good eye contact.  Cardiovascular: overall  no swelling, no varicosities, no edema bilaterally, normal temperatures of the legs and arms, no clubbing, cyanosis and good capillary refill.  Lymphatic: palpation is normal.  Right first extensor compartment of the hand has tenderness and positive Finkelstein sign but no swelling or redness.  NV intact.  All other systems reviewed and are negative   The patient has been educated about the nature of the problem(s) and counseled on treatment options.  The patient appeared to understand what I have discussed and is in agreement with it.  Encounter Diagnoses  Name Primary?  . Tendinitis of thumb Yes  . Right wrist pain     PLAN Call if any problems.  Precautions discussed.  Continue current medications.   Return to clinic 3 weeks   Electronically Signed Sanjuana Kava, MD 1/23/20199:18 AM

## 2017-05-16 DIAGNOSIS — E785 Hyperlipidemia, unspecified: Secondary | ICD-10-CM | POA: Diagnosis not present

## 2017-05-16 DIAGNOSIS — Z6826 Body mass index (BMI) 26.0-26.9, adult: Secondary | ICD-10-CM | POA: Diagnosis not present

## 2017-05-16 DIAGNOSIS — Z1331 Encounter for screening for depression: Secondary | ICD-10-CM | POA: Diagnosis not present

## 2017-05-16 DIAGNOSIS — M25532 Pain in left wrist: Secondary | ICD-10-CM | POA: Diagnosis not present

## 2017-05-16 DIAGNOSIS — Z0000 Encounter for general adult medical examination without abnormal findings: Secondary | ICD-10-CM | POA: Diagnosis not present

## 2017-05-16 DIAGNOSIS — Z6825 Body mass index (BMI) 25.0-25.9, adult: Secondary | ICD-10-CM | POA: Diagnosis not present

## 2017-05-16 DIAGNOSIS — M25531 Pain in right wrist: Secondary | ICD-10-CM | POA: Diagnosis not present

## 2017-05-16 DIAGNOSIS — E1165 Type 2 diabetes mellitus with hyperglycemia: Secondary | ICD-10-CM | POA: Diagnosis not present

## 2017-05-16 DIAGNOSIS — R944 Abnormal results of kidney function studies: Secondary | ICD-10-CM | POA: Diagnosis not present

## 2017-05-16 DIAGNOSIS — R2232 Localized swelling, mass and lump, left upper limb: Secondary | ICD-10-CM | POA: Diagnosis not present

## 2017-05-17 ENCOUNTER — Ambulatory Visit: Payer: PPO | Admitting: Orthopaedic Surgery

## 2017-05-23 ENCOUNTER — Ambulatory Visit: Payer: PPO | Admitting: Orthopaedic Surgery

## 2017-05-24 ENCOUNTER — Ambulatory Visit: Payer: PPO | Admitting: Orthopaedic Surgery

## 2017-05-24 ENCOUNTER — Encounter: Payer: Self-pay | Admitting: Orthopaedic Surgery

## 2017-05-24 VITALS — BP 195/85 | HR 65 | Ht 71.0 in | Wt 192.0 lb

## 2017-05-24 DIAGNOSIS — M654 Radial styloid tenosynovitis [de Quervain]: Secondary | ICD-10-CM

## 2017-05-24 DIAGNOSIS — G5601 Carpal tunnel syndrome, right upper limb: Secondary | ICD-10-CM | POA: Diagnosis not present

## 2017-05-24 DIAGNOSIS — M778 Other enthesopathies, not elsewhere classified: Secondary | ICD-10-CM

## 2017-05-24 DIAGNOSIS — M25531 Pain in right wrist: Secondary | ICD-10-CM | POA: Diagnosis not present

## 2017-05-24 DIAGNOSIS — M779 Enthesopathy, unspecified: Secondary | ICD-10-CM

## 2017-05-24 NOTE — Progress Notes (Signed)
Patient HC:WCBJSE SYLAS TWOMBLY, male DOB:[birth year], 72 y.o. GBT:[id number]  Chief Complaint  Patient presents with  . Follow-up    RIGHT WRIST AND HAND PAIN    HPI  JOANDRY SLAGTER is a 72 y.o. male who has recurrent Harriet Pho on the right hand first compartment.  He has been using his brace.  He has had more pain over the last several days.  He has no redness or swelling.  He has also developed pain in the right hand, numbness at night in the median nerve distribution more of the thumb and the index finger and long finger.  He has to shake it to "wake it up".  I will get EMGs. HPI  Body mass index is 26.78 kg/m.  ROS  Review of Systems  HENT: Negative for congestion.   Respiratory: Negative for cough and shortness of breath.   Cardiovascular: Negative for chest pain and leg swelling.  Musculoskeletal: Positive for arthralgias.  All other systems reviewed and are negative.   Past Medical History:  Diagnosis Date  . Arthritis   . Complicated UTI (urinary tract infection) Dec.2015  . Depression    hx. of  . Diabetes mellitus without complication (HCC)    diet controlled  . Foley catheter in place 03/20/2014  . GERD (gastroesophageal reflux disease)    occasionally  . Heart murmur    hx. of at 17 yrs. old  . Hypertension   . Pneumonia    hx. of  . Rheumatic fever    when 4 yrs. old  . Sleep apnea    does not use c-pap machine  . Sodium (Na) deficiency    managed in nursing center12/2015-Feb-2016    Past Surgical History:  Procedure Laterality Date  . CARPAL TUNNEL RELEASE Left 2010  . CERVICAL SPINE SURGERY  2010  . COLONOSCOPY WITH PROPOFOL N/A 11/30/2015   Procedure: COLONOSCOPY WITH PROPOFOL;  Surgeon: Daneil Dolin, MD;  Location: [facility];  Service: Endoscopy;  Laterality: N/A;  . KNEE ARTHROSCOPY  1989  . LUMBAR LAMINECTOMY  1986  . Nose surgery - broken nose    . POLYPECTOMY  11/30/2015   Procedure: POLYPECTOMY;  Surgeon: Daneil Dolin, MD;   Location: [facility];  Service: Endoscopy;;  polyp at ascending colon, rectal polyp  . TRANSURETHRAL INCISION OF PROSTATE N/A 01/20/2015   Procedure: TRANSURETHRAL INCISION OF THE PROSTATE (TUIP);  Surgeon: Irine Seal, MD;  Location: [facility];  Service: Urology;  Laterality: N/A;    History reviewed. No pertinent family history.  Social History Social History   Tobacco Use  . Smoking status: Former Smoker    Last attempt to quit: 08/06/1975    Years since quitting: 41.8  . Smokeless tobacco: Never Used  Substance Use Topics  . Alcohol use: Yes    Alcohol/week: 1.8 oz    Types: 3 Cans of beer per week    Comment: 3 12 oz cans of beer each week.None since 03/2014  . Drug use: No    No Known Allergies  Current Outpatient Medications  Medication Sig Dispense Refill  . amLODipine (NORVASC) 10 MG tablet Take 1 tablet (10 mg total) by mouth at bedtime. (Patient not taking: Reported on 04/12/2017) 30 tablet 0  . glipiZIDE (GLUCOTROL XL) 10 MG 24 hr tablet Take 10 mg by mouth 2 (two) times daily.     . hydrALAZINE (APRESOLINE) 25 MG tablet Take 1 tablet (25 mg total) by mouth 3 (three) times daily. (Patient not  taking: Reported on 04/12/2017) 90 tablet 0  . pioglitazone (ACTOS) 15 MG tablet Take 15 mg by mouth daily.     No current facility-administered medications for this visit.      Physical Exam  Blood pressure (!) 195/85, pulse 65, height 5\' 11"  (1.803 m), weight 192 lb (87.1 kg).  Constitutional: overall normal hygiene, normal nutrition, well developed, normal grooming, normal body habitus. Assistive device:thumb splint right  Musculoskeletal: gait and station Limp none, muscle tone and strength are normal, no tremors or atrophy is present.  .  Neurological: coordination overall normal.  Deep tendon reflex/nerve stretch intact.  Sensation normal.  Cranial nerves II-XII intact.   Skin:   Normal overall no scars, lesions, ulcers or rashes. No psoriasis.  Psychiatric: Alert and  oriented x 3.  Recent memory intact, remote memory unclear.  Normal mood and affect. Well groomed.  Good eye contact.  Cardiovascular: overall no swelling, no varicosities, no edema bilaterally, normal temperatures of the legs and arms, no clubbing, cyanosis and good capillary refill.  Lymphatic: palpation is normal.  The right first compartment of the dorsal extensors has pain and tenderness.  He has a positive Finkelstein sign.  NV intact.  There is no redness or swelling.  Median nerve has some decreased sensation with positive Phalen and negative Tinel on the right hand.  All other systems reviewed and are negative   The patient has been educated about the nature of the problem(s) and counseled on treatment options.  The patient appeared to understand what I have discussed and is in agreement with it.  Encounter Diagnoses  Name Primary?  . Right wrist pain Yes  . Tendinitis of thumb   . De Quervain's disease (radial styloid tenosynovitis)   . Carpal tunnel syndrome of right wrist    Procedure note: After permission from the patient and sterile prep, the area of the first extensor compartment of the right wrist was injected by sterile technique with 1% Xylocaine and 1 cc of DepoMedrol 40 and tolerated well.  PLAN Call if any problems.  Precautions discussed. Take one Aleve bid pc po. Return to clinic after EMGs   Electronically Signed Sanjuana Kava, MD 2/20/20192:25 PM

## 2017-05-29 ENCOUNTER — Telehealth: Payer: Self-pay | Admitting: Orthopaedic Surgery

## 2017-05-29 DIAGNOSIS — G629 Polyneuropathy, unspecified: Secondary | ICD-10-CM | POA: Diagnosis not present

## 2017-05-29 DIAGNOSIS — G5600 Carpal tunnel syndrome, unspecified upper limb: Secondary | ICD-10-CM | POA: Diagnosis not present

## 2017-05-29 NOTE — Telephone Encounter (Signed)
As per request, recent office notes including medication list faxed to Dr Orie Rout, at [facility], fax# [phone] / ph# [phone], per Summer.  Confirmed patient was seen there today, 05/29/17, as per referral.

## 2017-06-01 ENCOUNTER — Encounter: Payer: Self-pay | Admitting: Orthopaedic Surgery

## 2017-06-01 ENCOUNTER — Ambulatory Visit: Payer: PPO | Admitting: Orthopaedic Surgery

## 2017-06-01 VITALS — BP 189/89 | HR 59 | Ht 71.0 in | Wt 188.0 lb

## 2017-06-01 DIAGNOSIS — G5601 Carpal tunnel syndrome, right upper limb: Secondary | ICD-10-CM

## 2017-06-01 DIAGNOSIS — M654 Radial styloid tenosynovitis [de Quervain]: Secondary | ICD-10-CM | POA: Diagnosis not present

## 2017-06-01 NOTE — Progress Notes (Signed)
Patient [name], male DOB:1945/10/02, 72 y.o. GQQ:[id number]  Chief Complaint  Patient presents with  . Hand Pain    right   . Results    review EMG NCV  . Fall    HPI  Albert Garrison is a 72 y.o. male who has carpal tunnel syndrome and Harriet Pho disease of the right first extensor compartment.  He had EMGs done showing abnormal study with evidence of bilateral median mononeuropathies consistent with moderate carpal tunnel syndrome without denervation.  I will have him see Dr. Aline Brochure for possible surgery.  The Harriet Pho is better. He can come out of the thumb splint. HPI  Body mass index is 26.22 kg/m.  ROS  Review of Systems  Past Medical History:  Diagnosis Date  . Arthritis   . Complicated UTI (urinary tract infection) Dec.2015  . Depression    hx. of  . Diabetes mellitus without complication (HCC)    diet controlled  . Foley catheter in place 03/20/2014  . GERD (gastroesophageal reflux disease)    occasionally  . Heart murmur    hx. of at 17 yrs. old  . Hypertension   . Pneumonia    hx. of  . Rheumatic fever    when 4 yrs. old  . Sleep apnea    does not use c-pap machine  . Sodium (Na) deficiency    managed in nursing center12/2015-Feb-2016    Past Surgical History:  Procedure Laterality Date  . CARPAL TUNNEL RELEASE Left 2010  . CERVICAL SPINE SURGERY  2010  . COLONOSCOPY WITH PROPOFOL N/A 11/30/2015   Procedure: COLONOSCOPY WITH PROPOFOL;  Surgeon: Daneil Dolin, MD;  Location: [facility];  Service: Endoscopy;  Laterality: N/A;  . KNEE ARTHROSCOPY  1989  . LUMBAR LAMINECTOMY  1986  . Nose surgery - broken nose    . POLYPECTOMY  11/30/2015   Procedure: POLYPECTOMY;  Surgeon: Daneil Dolin, MD;  [facility]: [facility];  Service: Endoscopy;;  polyp at ascending colon, rectal polyp  . TRANSURETHRAL INCISION OF PROSTATE N/A 01/20/2015   Procedure: TRANSURETHRAL INCISION OF THE PROSTATE (TUIP);  Surgeon: Irine Seal, MD;   Location: [facility];  Service: Urology;  Laterality: N/A;    History reviewed. No pertinent family history.  Social History Social History   Tobacco Use  . Smoking status: Former Smoker    Last attempt to quit: 08/06/1975    Years since quitting: 41.8  . Smokeless tobacco: Never Used  Substance Use Topics  . Alcohol use: Yes    Alcohol/week: 1.8 oz    Types: 3 Cans of beer per week    Comment: 3 12 oz cans of beer each week.None since 03/2014  . Drug use: No    No Known Allergies  Current Outpatient Medications  Medication Sig Dispense Refill  . glipiZIDE (GLUCOTROL XL) 10 MG 24 hr tablet Take 10 mg by mouth 2 (two) times daily.     . pioglitazone (ACTOS) 15 MG tablet Take 15 mg by mouth daily.    Marland Kitchen amLODipine (NORVASC) 10 MG tablet Take 1 tablet (10 mg total) by mouth at bedtime. (Patient not taking: Reported on 04/12/2017) 30 tablet 0  . hydrALAZINE (APRESOLINE) 25 MG tablet Take 1 tablet (25 mg total) by mouth 3 (three) times daily. (Patient not taking: Reported on 04/12/2017) 90 tablet 0  . meloxicam (MOBIC) 15 MG tablet     . traMADol (ULTRAM) 50 MG tablet      No current facility-administered  medications for this visit.      Physical Exam  Blood pressure (!) 189/89, pulse (!) 59, height 5\' 11"  (1.803 m), weight 188 lb (85.3 kg).  Constitutional: overall normal hygiene, normal nutrition, well developed, normal grooming, normal body habitus. Assistive device:thumb splint right  Musculoskeletal: gait and station Limp none, muscle tone and strength are normal, no tremors or atrophy is present.  .  Neurological: coordination overall normal.  Deep tendon reflex/nerve stretch intact.  Sensation normal.  Cranial nerves II-XII intact.   Skin:   Normal overall no scars, lesions, ulcers or rashes. No psoriasis.  Psychiatric: Alert and oriented x 3.  Recent memory intact, remote memory unclear.  Normal mood and affect. Well groomed.  Good eye contact.  Cardiovascular: overall no  swelling, no varicosities, no edema bilaterally, normal temperatures of the legs and arms, no clubbing, cyanosis and good capillary refill.  Lymphatic: palpation is normal.  He has bilateral positive Tinel and Phalens of the wrists.  He has decreased sensation of median nerve distribution.  The first extensor compartment on the right is not painful today and has negative Finkelstein sign.  All other systems reviewed and are negative   The patient has been educated about the nature of the problem(s) and counseled on treatment options.  The patient appeared to understand what I have discussed and is in agreement with it.  Encounter Diagnoses  Name Primary?  . Carpal tunnel syndrome of right wrist Yes  . De Quervain's disease (radial styloid tenosynovitis)     PLAN Call if any problems.  Precautions discussed.  Continue current medications.   Return to clinic see Dr. Aline Brochure for possible surgery carpal tunnel   Electronically Signed Sanjuana Kava, MD 2/28/201910:28 AM

## 2017-06-06 DIAGNOSIS — M25532 Pain in left wrist: Secondary | ICD-10-CM | POA: Diagnosis not present

## 2017-06-06 DIAGNOSIS — G5600 Carpal tunnel syndrome, unspecified upper limb: Secondary | ICD-10-CM | POA: Diagnosis not present

## 2017-06-06 DIAGNOSIS — R2232 Localized swelling, mass and lump, left upper limb: Secondary | ICD-10-CM | POA: Diagnosis not present

## 2017-06-06 DIAGNOSIS — Z6826 Body mass index (BMI) 26.0-26.9, adult: Secondary | ICD-10-CM | POA: Diagnosis not present

## 2017-06-12 DIAGNOSIS — M25532 Pain in left wrist: Secondary | ICD-10-CM | POA: Diagnosis not present

## 2017-06-12 DIAGNOSIS — M25531 Pain in right wrist: Secondary | ICD-10-CM | POA: Diagnosis not present

## 2017-06-12 DIAGNOSIS — E1165 Type 2 diabetes mellitus with hyperglycemia: Secondary | ICD-10-CM | POA: Diagnosis not present

## 2017-06-12 DIAGNOSIS — Z0000 Encounter for general adult medical examination without abnormal findings: Secondary | ICD-10-CM | POA: Diagnosis not present

## 2017-06-12 DIAGNOSIS — Z6825 Body mass index (BMI) 25.0-25.9, adult: Secondary | ICD-10-CM | POA: Diagnosis not present

## 2017-06-12 DIAGNOSIS — R2232 Localized swelling, mass and lump, left upper limb: Secondary | ICD-10-CM | POA: Diagnosis not present

## 2017-06-12 DIAGNOSIS — R944 Abnormal results of kidney function studies: Secondary | ICD-10-CM | POA: Diagnosis not present

## 2017-06-12 DIAGNOSIS — E785 Hyperlipidemia, unspecified: Secondary | ICD-10-CM | POA: Diagnosis not present

## 2017-06-12 DIAGNOSIS — Z6826 Body mass index (BMI) 26.0-26.9, adult: Secondary | ICD-10-CM | POA: Diagnosis not present

## 2017-06-12 DIAGNOSIS — Z1331 Encounter for screening for depression: Secondary | ICD-10-CM | POA: Diagnosis not present

## 2017-06-14 DIAGNOSIS — R944 Abnormal results of kidney function studies: Secondary | ICD-10-CM | POA: Diagnosis not present

## 2017-06-14 DIAGNOSIS — M25532 Pain in left wrist: Secondary | ICD-10-CM | POA: Diagnosis not present

## 2017-06-14 DIAGNOSIS — M25531 Pain in right wrist: Secondary | ICD-10-CM | POA: Diagnosis not present

## 2017-06-14 DIAGNOSIS — E785 Hyperlipidemia, unspecified: Secondary | ICD-10-CM | POA: Diagnosis not present

## 2017-06-14 DIAGNOSIS — R2232 Localized swelling, mass and lump, left upper limb: Secondary | ICD-10-CM | POA: Diagnosis not present

## 2017-06-14 DIAGNOSIS — Z1331 Encounter for screening for depression: Secondary | ICD-10-CM | POA: Diagnosis not present

## 2017-06-14 DIAGNOSIS — Z6826 Body mass index (BMI) 26.0-26.9, adult: Secondary | ICD-10-CM | POA: Diagnosis not present

## 2017-06-14 DIAGNOSIS — Z6825 Body mass index (BMI) 25.0-25.9, adult: Secondary | ICD-10-CM | POA: Diagnosis not present

## 2017-06-14 DIAGNOSIS — Z0000 Encounter for general adult medical examination without abnormal findings: Secondary | ICD-10-CM | POA: Diagnosis not present

## 2017-06-14 DIAGNOSIS — E1165 Type 2 diabetes mellitus with hyperglycemia: Secondary | ICD-10-CM | POA: Diagnosis not present

## 2017-06-27 DIAGNOSIS — M25531 Pain in right wrist: Secondary | ICD-10-CM | POA: Diagnosis not present

## 2017-06-27 DIAGNOSIS — R600 Localized edema: Secondary | ICD-10-CM | POA: Diagnosis not present

## 2017-06-27 DIAGNOSIS — M25532 Pain in left wrist: Secondary | ICD-10-CM | POA: Diagnosis not present

## 2017-06-28 ENCOUNTER — Ambulatory Visit: Payer: PPO | Admitting: Orthopedic Surgery

## 2017-06-28 VITALS — BP 131/83 | HR 72 | Ht 71.0 in | Wt 188.0 lb

## 2017-06-28 DIAGNOSIS — G5601 Carpal tunnel syndrome, right upper limb: Secondary | ICD-10-CM

## 2017-06-28 NOTE — Progress Notes (Signed)
PREOP CONSULT/REFERRAL INTRA-OFFICE FROM DR Tyrone Apple   Chief Complaint  Patient presents with  . Follow-up    Discuss surgery for CTS, Dr, Luna Glasgow Referred.    72 year old male presents for possible right carpal tunnel release.  Previous nonoperative treatment include oral mallet medication meloxicam tramadol as well as right wrist splints.  Complains of pain and paresthesias right upper extremity now present for several months  Previous left carpal tunnel release  He has a nerve conduction study with an EMG indicating moderate mononeuropathies across the wrist with moderate carpal tunnel syndrome without denervation     Review of Systems  Constitutional: Negative for fever.  Musculoskeletal: Positive for joint pain and neck pain.  Neurological: Positive for tingling and sensory change. Negative for focal weakness and weakness.     Past Medical History:  Diagnosis Date  . Arthritis   . Complicated UTI (urinary tract infection) Dec.2015  . Depression    hx. of  . Diabetes mellitus without complication (HCC)    diet controlled  . Foley catheter in place 03/20/2014  . GERD (gastroesophageal reflux disease)    occasionally  . Heart murmur    hx. of at 17 yrs. old  . Hypertension   . Pneumonia    hx. of  . Rheumatic fever    when 4 yrs. old  . Sleep apnea    does not use c-pap machine  . Sodium (Na) deficiency    managed in nursing center12/2015-Feb-2016    Past Surgical History:  Procedure Laterality Date  . CARPAL TUNNEL RELEASE Left 2010  . CERVICAL SPINE SURGERY  2010  . COLONOSCOPY WITH PROPOFOL N/A 11/30/2015   Procedure: COLONOSCOPY WITH PROPOFOL;  Surgeon: Daneil Dolin, MD;  [facility]: [facility];  Service: Endoscopy;  Laterality: N/A;  . KNEE ARTHROSCOPY  1989  . LUMBAR LAMINECTOMY  1986  . Nose surgery - broken nose    . POLYPECTOMY  11/30/2015   Procedure: POLYPECTOMY;  Surgeon: Daneil Dolin, MD;  [facility]: [facility];  Service: Endoscopy;;   polyp at ascending colon, rectal polyp  . TRANSURETHRAL INCISION OF PROSTATE N/A 01/20/2015   Procedure: TRANSURETHRAL INCISION OF THE PROSTATE (TUIP);  Surgeon: Irine Seal, MD;  Location: [facility];  Service: Urology;  Laterality: N/A;    No family history on file. Social History   Tobacco Use  . Smoking status: Former Smoker    Last attempt to quit: 08/06/1975    Years since quitting: 41.9  . Smokeless tobacco: Never Used  Substance Use Topics  . Alcohol use: Yes    Alcohol/week: 1.8 oz    Types: 3 Cans of beer per week    Comment: 3 12 oz cans of beer each week.None since 03/2014  . Drug use: No    @ALL @  No outpatient medications have been marked as taking for the 06/28/17 encounter (Office Visit) with Carole Civil, MD.    BP 131/83   Pulse 72   Ht 5\' 11"  (1.803 m)   Wt 188 lb (85.3 kg)   BMI 26.22 kg/m   Physical Exam  Constitutional: He is oriented to person, place, and time. He appears well-developed and well-nourished.  Vital signs have been reviewed and are stable. Gen. appearance the patient is well-developed and well-nourished with normal grooming and hygiene.   Neurological: He is alert and oriented to person, place, and time.  Skin: Skin is warm and dry. No erythema.  Psychiatric: He has a normal mood and affect.  Vitals reviewed.   Ortho Exam   Right wrist tenderness over the carpal tunnel positive compression test positive Tinel's test normal range of motion no instability normal grip strength normal skin normal pulses decreased sensation median nerve normal epitrochlear lymph nodes  Scar left wrist mild symptoms there as well  MEDICAL DECISION SECTION  xrays ordered? no  My independent reading of xrays: no  EMG study has been read into the chart   Encounter Diagnosis  Name Primary?  . Carpal tunnel syndrome of right wrist Yes     PLAN:   Surgical procedure planned: Right carpal tunnel release  The procedure has been fully reviewed  with the patient; The risks and benefits of surgery have been discussed and explained and understood. Alternative treatment has also been reviewed, questions were encouraged and answered. The postoperative plan is also been reviewed.  Nonsurgical treatment as described in the history and physical section was attempted and unsuccessful and the patient has agreed to proceed with surgical intervention to improve their situation.

## 2017-06-29 ENCOUNTER — Telehealth: Payer: Self-pay | Admitting: Radiology

## 2017-06-29 NOTE — Telephone Encounter (Signed)
Patient does not have a phone, so I have mailed him the information  I have also started a prior auth request to get prior approval for the surgery  Faxed to [facility]

## 2017-06-29 NOTE — Telephone Encounter (Signed)
-----   Message from Josue Hector sent at 06/29/2017  9:41 AM EDT ----- I set his PAT for 4/5 @ 2:30.  ----- Message ----- From: Candice Camp, RT Sent: 06/28/2017   2:34 PM To: Arial, hope you are having a great day, Dr Aline Brochure is going to post this patient for CTR on 07/13/17, but he does not have a phone, he wants you to let me know about the pre op appointment and I will mail information to him, or he told me he will stop by here and get the information.

## 2017-07-03 ENCOUNTER — Telehealth: Payer: Self-pay | Admitting: Orthopedic Surgery

## 2017-07-03 NOTE — Telephone Encounter (Signed)
Yes, with me

## 2017-07-03 NOTE — Telephone Encounter (Signed)
Abundio Miu (aunt) called stating that pt's AVS showed he has a  post-op appointment on 4/25, but we don't have him scheduled.       I know that Dr. Aline Brochure will not be here that week, so do             You need him to come in that day(Thursday) for suture or           staple removal ?   Please advise

## 2017-07-04 NOTE — Patient Instructions (Signed)
Albert Garrison  07/04/2017     @PREFPERIOPPHARMACY @   Your procedure is scheduled on  07/13/2017 .  Report to [facility] at  615  A.M.  Call this number if you have problems the morning of surgery:  [phone]   Remember:  Do not eat food or drink liquids after midnight.  Take these medicines the morning of surgery with A SIP OF WATER  Neurontin, microzide.   Do not wear jewelry, make-up or nail polish.  Do not wear lotions, powders, or perfumes, or deodorant.  Do not shave 48 hours prior to surgery.  Men may shave face and neck.  Do not bring valuables to the hospital.  [facility] is not responsible for any belongings or valuables.  Contacts, dentures or bridgework may not be worn into surgery.  Leave your suitcase in the car.  After surgery it may be brought to your room.  For patients admitted to the hospital, discharge time will be determined by your treatment team.  Patients discharged the day of surgery will not be allowed to drive home.   Name and phone number of your driver:   family Special instructions:  None  Please read over the following fact sheets that you were given. Anesthesia Post-op Instructions and Care and Recovery After Surgery       Carpal Tunnel Release Carpal tunnel release is a surgical procedure to relieve numbness and pain in your hand that are caused by carpal tunnel syndrome. Your carpal tunnel is a narrow, hollow space in your wrist. It passes between your wrist bones and a band of connective tissue (transverse carpal ligament). The nerve that supplies most of your hand (median nerve) passes through this space, and so do the connections between your fingers and the muscles of your arm (tendons). Carpal tunnel syndrome makes this space swell and become narrow, and this causes pain and numbness. In carpal tunnel release surgery, a surgeon cuts through the transverse carpal ligament to make more room in the carpal tunnel  space. You may have this surgery if other types of treatment have not worked. Tell a health care provider about:  Any allergies you have.  All medicines you are taking, including vitamins, herbs, eye drops, creams, and over-the-counter medicines.  Any problems you or family members have had with anesthetic medicines.  Any blood disorders you have.  Any surgeries you have had.  Any medical conditions you have. What are the risks? Generally, this is a safe procedure. However, problems may occur, including:  Bleeding.  Infection.  Injury to the median nerve.  Need for additional surgery.  What happens before the procedure?  Ask your health care provider about: ? Changing or stopping your regular medicines. This is especially important if you are taking diabetes medicines or blood thinners. ? Taking medicines such as aspirin and ibuprofen. These medicines can thin your blood. Do not take these medicines before your procedure if your health care provider instructs you not to.  Do not eat or drink anything after midnight on the night before the procedure or as directed by your health care provider.  Plan to have someone take you home after the procedure. What happens during the procedure?  An IV tube may be inserted into a vein.  You will be given one of the following: ? A medicine that numbs the wrist area (local anesthetic). You may also be given a medicine to make  you relax (sedative). ? A medicine that makes you go to sleep (general anesthetic).  Your arm, hand, and wrist will be cleaned with a germ-killing solution (antiseptic).  Your surgeon will make a surgical cut (incision) over the palm side of your wrist. The surgeon will pull aside the skin of your wrist to expose the carpal tunnel space.  The surgeon will cut the transverse carpal ligament.  The edges of the incision will be closed with stitches (sutures) or staples.  A bandage (dressing) will be placed over  your wrist and wrapped around your hand and wrist. What happens after the procedure?  You may spend some time in a recovery area.  Your blood pressure, heart rate, breathing rate, and blood oxygen level will be monitored often until the medicines you were given have worn off.  You will likely have some pain. You will be given pain medicine.  You may need to wear a splint or a wrist brace over your dressing. This information is not intended to replace advice given to you by your health care provider. Make sure you discuss any questions you have with your health care provider. Document Released: 06/11/2003 Document Revised: 08/27/2015 Document Reviewed: 11/06/2013 Elsevier Interactive Patient Education  2018 [place] After Refer to this sheet in the next few weeks. These instructions provide you with information about caring for yourself after your procedure. Your health care provider may also give you more specific instructions. Your treatment has been planned according to current medical practices, but problems sometimes occur. Call your health care provider if you have any problems or questions after your procedure. What can I expect after the procedure? After your procedure, it is typical to have the following:  Pain.  Numbness.  Tingling.  Swelling.  Stiffness.  Bruising.  Follow these instructions at home:  Take medicines only as directed by your health care provider.  There are many different ways to close and cover an incision, including stitches (sutures), skin glue, and adhesive strips. Follow your health care provider's instructions about: ? Incision care. ? Bandage (dressing) changes and removal. ? Incision closure removal.  Wear a splint or a brace as directed by your surgeon. You may need to do this for 2-3 weeks.  Keep your hand raised (elevated) above the level of your heart while you are resting. Move your fingers  often.  Avoid activities that cause hand pain.  Ask your surgeon when you can start to do all of your usual activities again, such as: ? Driving. ? Returning to work. ? Bathing and swimming.  Keep all follow-up visits as directed by your health care provider. This is important. You may need physical therapy for several months to speed healing and regain movement. Contact a health care provider if:  You have drainage, redness, swelling, or pain at your incision site.  You have a fever.  You have chills.  Your pain medicine is not working.  Your symptoms do not go away after 2 months.  Your symptoms go away and then return. Get help right away if:  You have pain or numbness that is getting worse.  Your fingers change color.  You are not able to move your fingers. This information is not intended to replace advice given to you by your health care provider. Make sure you discuss any questions you have with your health care provider. Document Released: 10/08/2004 Document Revised: 08/27/2015 Document Reviewed: 11/06/2013 Elsevier Interactive Patient Education  2018 [employer] Carpal Tunnel Release, Care After Refer to this sheet in the next few weeks. These instructions provide you with information about caring for yourself after your procedure. Your health care provider may also give you more specific instructions. Your treatment has been planned according to current medical practices, but problems sometimes occur. Call your health care provider if you have any problems or questions after your procedure. What can I expect after the procedure? After your procedure, it is typical to have the following:  Pain.  Numbness.  Tingling.  Swelling.  Stiffness.  Bruising.  Follow these instructions at home:  Take medicines only as directed by your health care provider.  There are many different ways to close and cover an incision, including stitches (sutures), skin glue,  and adhesive strips. Follow your health care provider's instructions about: ? Incision care. ? Bandage (dressing) changes and removal. ? Incision closure removal.  Wear a splint or a brace as directed by your surgeon. You may need to do this for 2-3 weeks.  Keep your hand raised (elevated) above the level of your heart while you are resting. Move your fingers often.  Avoid activities that cause hand pain.  Ask your surgeon when you can start to do all of your usual activities again, such as: ? Driving. ? Returning to work. ? Bathing and swimming.  Keep all follow-up visits as directed by your health care provider. This is important. You may need physical therapy for several months to speed healing and regain movement. Contact a health care provider if:  You have drainage, redness, swelling, or pain at your incision site.  You have a fever.  You have chills.  Your pain medicine is not working.  Your symptoms do not go away after 2 months.  Your symptoms go away and then return. Get help right away if:  You have pain or numbness that is getting worse.  Your fingers change color.  You are not able to move your fingers. This information is not intended to replace advice given to you by your health care provider. Make sure you discuss any questions you have with your health care provider. Document Released: 10/08/2004 Document Revised: 08/27/2015 Document Reviewed: 11/06/2013 Elsevier Interactive Patient Education  2018 [place] Anesthesia is a term that refers to techniques, procedures, and medicines that help a person stay safe and comfortable during a medical procedure. Monitored anesthesia care, or sedation, is one type of anesthesia. Your anesthesia specialist may recommend sedation if you will be having a procedure that does not require you to be unconscious, such as:  Cataract surgery.  A dental procedure.  A biopsy.  A  colonoscopy.  During the procedure, you may receive a medicine to help you relax (sedative). There are three levels of sedation:  Mild sedation. At this level, you may feel awake and relaxed. You will be able to follow directions.  Moderate sedation. At this level, you will be sleepy. You may not remember the procedure.  Deep sedation. At this level, you will be asleep. You will not remember the procedure.  The more medicine you are given, the deeper your level of sedation will be. Depending on how you respond to the procedure, the anesthesia specialist may change your level of sedation or the type of anesthesia to fit your needs. An anesthesia specialist will monitor you closely during the procedure. Let your health care provider know about:  Any allergies you have.  All medicines you are  taking, including vitamins, herbs, eye drops, creams, and over-the-counter medicines.  Any use of steroids (by mouth or as a cream).  Any problems you or family members have had with sedatives and anesthetic medicines.  Any blood disorders you have.  Any surgeries you have had.  Any medical conditions you have, such as sleep apnea.  Whether you are pregnant or may be pregnant.  Any use of cigarettes, alcohol, or street drugs. What are the risks? Generally, this is a safe procedure. However, problems may occur, including:  Getting too much medicine (oversedation).  Nausea.  Allergic reaction to medicines.  Trouble breathing. If this happens, a breathing tube may be used to help with breathing. It will be removed when you are awake and breathing on your own.  Heart trouble.  Lung trouble.  Before the procedure Staying hydrated Follow instructions from your health care provider about hydration, which may include:  Up to 2 hours before the procedure - you may continue to drink clear liquids, such as water, clear fruit juice, black coffee, and plain tea.  Eating and drinking  restrictions Follow instructions from your health care provider about eating and drinking, which may include:  8 hours before the procedure - stop eating heavy meals or foods such as meat, fried foods, or fatty foods.  6 hours before the procedure - stop eating light meals or foods, such as toast or cereal.  6 hours before the procedure - stop drinking milk or drinks that contain milk.  2 hours before the procedure - stop drinking clear liquids.  Medicines Ask your health care provider about:  Changing or stopping your regular medicines. This is especially important if you are taking diabetes medicines or blood thinners.  Taking medicines such as aspirin and ibuprofen. These medicines can thin your blood. Do not take these medicines before your procedure if your health care provider instructs you not to.  Tests and exams  You will have a physical exam.  You may have blood tests done to show: ? How well your kidneys and liver are working. ? How well your blood can clot.  General instructions  Plan to have someone take you home from the hospital or clinic.  If you will be going home right after the procedure, plan to have someone with you for 24 hours.  What happens during the procedure?  Your blood pressure, heart rate, breathing, level of pain and overall condition will be monitored.  An IV tube will be inserted into one of your veins.  Your anesthesia specialist will give you medicines as needed to keep you comfortable during the procedure. This may mean changing the level of sedation.  The procedure will be performed. After the procedure  Your blood pressure, heart rate, breathing rate, and blood oxygen level will be monitored until the medicines you were given have worn off.  Do not drive for 24 hours if you received a sedative.  You may: ? Feel sleepy, clumsy, or nauseous. ? Feel forgetful about what happened after the procedure. ? Have a sore throat if you had a  breathing tube during the procedure. ? Vomit. This information is not intended to replace advice given to you by your health care provider. Make sure you discuss any questions you have with your health care provider. Document Released: 12/15/2004 Document Revised: 08/28/2015 Document Reviewed: 07/12/2015 Elsevier Interactive Patient Education  2018 [place], Care After These instructions provide you with information about caring for yourself after  your procedure. Your health care provider may also give you more specific instructions. Your treatment has been planned according to current medical practices, but problems sometimes occur. Call your health care provider if you have any problems or questions after your procedure. What can I expect after the procedure? After your procedure, it is common to:  Feel sleepy for several hours.  Feel clumsy and have poor balance for several hours.  Feel forgetful about what happened after the procedure.  Have poor judgment for several hours.  Feel nauseous or vomit.  Have a sore throat if you had a breathing tube during the procedure.  Follow these instructions at home: For at least 24 hours after the procedure:   Do not: ? Participate in activities in which you could fall or become injured. ? Drive. ? Use heavy machinery. ? Drink alcohol. ? Take sleeping pills or medicines that cause drowsiness. ? Make important decisions or sign legal documents. ? Take care of children on your own.  Rest. Eating and drinking  Follow the diet that is recommended by your health care provider.  If you vomit, drink water, juice, or soup when you can drink without vomiting.  Make sure you have little or no nausea before eating solid foods. General instructions  Have a responsible adult stay with you until you are awake and alert.  Take over-the-counter and prescription medicines only as told by your health care  provider.  If you smoke, do not smoke without supervision.  Keep all follow-up visits as told by your health care provider. This is important. Contact a health care provider if:  You keep feeling nauseous or you keep vomiting.  You feel light-headed.  You develop a G[place].  You have a fever. Get help right away if:  You have trouble breathing. This information is not intended to replace advice given to you by your health care provider. Make sure you discuss any questions you have with your health care provider. Document Released: 07/12/2015 Document Revised: 11/11/2015 Document Reviewed: 07/12/2015 Elsevier Interactive Patient Education  [employer].

## 2017-07-07 ENCOUNTER — Other Ambulatory Visit: Payer: Self-pay

## 2017-07-07 ENCOUNTER — Encounter (HOSPITAL_COMMUNITY)
Admission: RE | Admit: 2017-07-07 | Discharge: 2017-07-07 | Disposition: A | Discharge status: Home / Self Care | Payer: PPO | Source: Ambulatory Visit | Attending: Orthopedic Surgery | Admitting: Orthopedic Surgery

## 2017-07-07 ENCOUNTER — Encounter (HOSPITAL_COMMUNITY): Payer: Self-pay

## 2017-07-07 ENCOUNTER — Telehealth: Payer: Self-pay | Admitting: Radiology

## 2017-07-07 DIAGNOSIS — Z01812 Encounter for preprocedural laboratory examination: Secondary | ICD-10-CM | POA: Diagnosis not present

## 2017-07-07 HISTORY — DX: Unspecified hearing loss, unspecified ear: H91.90

## 2017-07-07 LAB — CBC WITH DIFFERENTIAL/PLATELET
BASOS ABS: 0 10*3/uL (ref 0.0–0.1)
Basophils Relative: 0 %
Eosinophils Absolute: 0.1 10*3/uL (ref 0.0–0.7)
Eosinophils Relative: 1 %
HEMATOCRIT: 37.6 % — AB (ref 39.0–52.0)
HEMOGLOBIN: 12.9 g/dL — AB (ref 13.0–17.0)
LYMPHS PCT: 28 %
Lymphs Abs: 2 10*3/uL (ref 0.7–4.0)
MCH: 29.8 pg (ref 26.0–34.0)
MCHC: 34.3 g/dL (ref 30.0–36.0)
MCV: 86.8 fL (ref 78.0–100.0)
MONO ABS: 0.8 10*3/uL (ref 0.1–1.0)
Monocytes Relative: 11 %
NEUTROS ABS: 4.1 10*3/uL (ref 1.7–7.7)
Neutrophils Relative %: 60 %
Platelets: 344 10*3/uL (ref 150–400)
RBC: 4.33 MIL/uL (ref 4.22–5.81)
RDW: 12.5 % (ref 11.5–15.5)
WBC: 6.9 10*3/uL (ref 4.0–10.5)

## 2017-07-07 LAB — BASIC METABOLIC PANEL
ANION GAP: 13 (ref 5–15)
BUN: 12 mg/dL (ref 6–20)
CALCIUM: 9.2 mg/dL (ref 8.9–10.3)
CHLORIDE: 94 mmol/L — AB (ref 101–111)
CO2: 22 mmol/L (ref 22–32)
Creatinine, Ser: 1.25 mg/dL — ABNORMAL HIGH (ref 0.61–1.24)
GFR calc Af Amer: >60 mL/min (ref >60)
GFR calc non Af Amer: 56 mL/min — ABNORMAL LOW (ref >60)
Glucose, Bld: 205 mg/dL — ABNORMAL HIGH (ref 65–99)
POTASSIUM: 4.1 mmol/L (ref 3.5–5.1)
Sodium: 129 mmol/L — ABNORMAL LOW (ref 135–145)

## 2017-07-07 LAB — HEMOGLOBIN A1C
Hgb A1c MFr Bld: 7.2 % — ABNORMAL HIGH (ref 4.8–5.6)
Mean Plasma Glucose: 159.94 mg/dL

## 2017-07-07 LAB — GLUCOSE, CAPILLARY: GLUCOSE-CAPILLARY: 196 mg/dL — AB (ref 65–99)

## 2017-07-07 NOTE — Telephone Encounter (Signed)
Phone call from [facility] surgery for CTR has been approved / number for Josem Kaufmann is [phone]

## 2017-07-10 NOTE — Pre-Procedure Instructions (Signed)
HgbA1C routed to PCP. 

## 2017-07-10 NOTE — Pre-Procedure Instructions (Signed)
BMet routed to PCP. 

## 2017-07-10 NOTE — Pre-Procedure Instructions (Signed)
Sodium of 129 and Hgb A1C of 7.2 to June at Dr [facility] office. She will let Dr Nevada Crane know.

## 2017-07-12 NOTE — H&P (Signed)
PREOP CONSULT/REFERRAL INTRA-OFFICE FROM DR Tyrone Apple     Chief Complaint  Patient presents with  . Follow-up      Discuss surgery for CTS, Dr, Luna Glasgow Referred.      72 year old male presents for possible right carpal tunnel release.  Previous nonoperative treatment include oral mallet medication meloxicam tramadol as well as right wrist splints.   Complains of pain and paresthesias right upper extremity now present for several months   Previous left carpal tunnel release   He has a nerve conduction study with an EMG indicating moderate mononeuropathies across the wrist with moderate carpal tunnel syndrome without denervation         Review of Systems  Constitutional: Negative for fever.  Musculoskeletal: Positive for joint pain and neck pain.  Neurological: Positive for tingling and sensory change. Negative for focal weakness and weakness.            Past Medical History:  Diagnosis Date  . Arthritis    . Complicated UTI (urinary tract infection) Dec.2015  . Depression      hx. of  . Diabetes mellitus without complication (HCC)      diet controlled  . Foley catheter in place 03/20/2014  . GERD (gastroesophageal reflux disease)      occasionally  . Heart murmur      hx. of at 17 yrs. old  . Hypertension    . Pneumonia      hx. of  . Rheumatic fever      when 4 yrs. old  . Sleep apnea      does not use c-pap machine  . Sodium (Na) deficiency      managed in nursing center12/2015-Feb-2016           Past Surgical History:  Procedure Laterality Date  . CARPAL TUNNEL RELEASE Left 2010  . CERVICAL SPINE SURGERY   2010  . COLONOSCOPY WITH PROPOFOL N/A 11/30/2015    Procedure: COLONOSCOPY WITH PROPOFOL;  Surgeon: Daneil Dolin, MD;  Location: [facility];  Service: Endoscopy;  Laterality: N/A;  . KNEE ARTHROSCOPY   1989  . LUMBAR LAMINECTOMY   1986  . Nose surgery - broken nose      . POLYPECTOMY   11/30/2015    Procedure: POLYPECTOMY;  Surgeon: Daneil Dolin, MD;  Location: [facility];  Service: Endoscopy;;  polyp at ascending colon, rectal polyp  . TRANSURETHRAL INCISION OF PROSTATE N/A 01/20/2015    Procedure: TRANSURETHRAL INCISION OF THE PROSTATE (TUIP);  Surgeon: Irine Seal, MD;  Location: [facility];  Service: Urology;  Laterality: N/A;      No family history on file. Social History         Tobacco Use  . Smoking status: Former Smoker      Last attempt to quit: 08/06/1975      Years since quitting: 41.9  . Smokeless tobacco: Never Used  Substance Use Topics  . Alcohol use: Yes      Alcohol/week: 1.8 oz      Types: 3 Cans of beer per week      Comment: 3 12 oz cans of beer each week.None since 03/2014  . Drug use: No      @ALL @   Active Medications  No outpatient medications have been marked as taking for the 06/28/17 encounter (Office Visit) with Carole Civil, MD.        BP 131/83   Pulse 72   Ht 5\' 11"  (1.803 m)  Wt 188 lb (85.3 kg)   BMI 26.22 kg/m    Physical Exam  Constitutional: He is oriented to person, place, and time. He appears well-developed and well-nourished.  Vital signs have been reviewed and are stable. Gen. appearance the patient is well-developed and well-nourished with normal grooming and hygiene.   Neurological: He is alert and oriented to person, place, and time.  Skin: Skin is warm and dry. No erythema.  Psychiatric: He has a normal mood and affect.  Vitals reviewed.     Ortho Exam    Right wrist tenderness over the carpal tunnel positive compression test positive Tinel's test normal range of motion no instability normal grip strength normal skin normal pulses decreased sensation median nerve normal epitrochlear lymph nodes   Scar left wrist mild symptoms there as well   MEDICAL DECISION SECTION  xrays ordered? no   My independent reading of xrays: no   EMG study has been read into the chart         Encounter Diagnosis  Name Primary?  . Carpal tunnel syndrome of right  wrist Yes        PLAN:    Surgical procedure planned: Right carpal tunnel release   The procedure has been fully reviewed with the patient; The risks and benefits of surgery have been discussed and explained and understood. Alternative treatment has also been reviewed, questions were encouraged and answered. The postoperative plan is also been reviewed.   Nonsurgical treatment as described in the history and physical section was attempted and unsuccessful and the patient has agreed to proceed with surgical intervention to improve their situation.

## 2017-07-13 ENCOUNTER — Ambulatory Visit (HOSPITAL_COMMUNITY): Payer: PPO | Admitting: Anesthesiology

## 2017-07-13 ENCOUNTER — Encounter (HOSPITAL_COMMUNITY): Payer: Self-pay | Admitting: *Deleted

## 2017-07-13 ENCOUNTER — Ambulatory Visit (HOSPITAL_COMMUNITY)
Admission: RE | Admit: 2017-07-13 | Discharge: 2017-07-13 | Disposition: A | Discharge status: Home / Self Care | Payer: PPO | Source: Ambulatory Visit | Attending: Orthopedic Surgery | Admitting: Orthopedic Surgery

## 2017-07-13 ENCOUNTER — Encounter (HOSPITAL_COMMUNITY)
Admission: RE | Disposition: A | Discharge status: Home / Self Care | Payer: Self-pay | Source: Ambulatory Visit | Attending: Orthopedic Surgery

## 2017-07-13 DIAGNOSIS — K219 Gastro-esophageal reflux disease without esophagitis: Secondary | ICD-10-CM | POA: Diagnosis not present

## 2017-07-13 DIAGNOSIS — G4730 Sleep apnea, unspecified: Secondary | ICD-10-CM | POA: Diagnosis not present

## 2017-07-13 DIAGNOSIS — Z87891 Personal history of nicotine dependence: Secondary | ICD-10-CM | POA: Diagnosis not present

## 2017-07-13 DIAGNOSIS — E119 Type 2 diabetes mellitus without complications: Secondary | ICD-10-CM | POA: Insufficient documentation

## 2017-07-13 DIAGNOSIS — F329 Major depressive disorder, single episode, unspecified: Secondary | ICD-10-CM | POA: Diagnosis not present

## 2017-07-13 DIAGNOSIS — I10 Essential (primary) hypertension: Secondary | ICD-10-CM | POA: Insufficient documentation

## 2017-07-13 DIAGNOSIS — Z79899 Other long term (current) drug therapy: Secondary | ICD-10-CM | POA: Insufficient documentation

## 2017-07-13 DIAGNOSIS — G5601 Carpal tunnel syndrome, right upper limb: Secondary | ICD-10-CM | POA: Diagnosis not present

## 2017-07-13 HISTORY — PX: CARPAL TUNNEL RELEASE: SHX101

## 2017-07-13 LAB — GLUCOSE, CAPILLARY
Glucose-Capillary: 158 mg/dL — ABNORMAL HIGH (ref 65–99)
Glucose-Capillary: 170 mg/dL — ABNORMAL HIGH (ref 65–99)

## 2017-07-13 SURGERY — CARPAL TUNNEL RELEASE
Anesthesia: Monitor Anesthesia Care | Site: Hand | Laterality: Right

## 2017-07-13 MED ORDER — SODIUM CHLORIDE 0.9% FLUSH
INTRAVENOUS | Status: AC
  Filled 2017-07-13: qty 10

## 2017-07-13 MED ORDER — MIDAZOLAM HCL 5 MG/5ML IJ SOLN
INTRAMUSCULAR | Status: DC | PRN
  Administered 2017-07-13: 1 mg via INTRAVENOUS

## 2017-07-13 MED ORDER — MEPERIDINE HCL 50 MG/ML IJ SOLN: 6.2500 mg | INTRAMUSCULAR | Status: DC | PRN

## 2017-07-13 MED ORDER — LIDOCAINE HCL (PF) 0.5 % IJ SOLN
INTRAMUSCULAR | Status: DC | PRN
  Administered 2017-07-13: 50 mL via INTRAVENOUS

## 2017-07-13 MED ORDER — ACETAMINOPHEN-CODEINE #3 300-30 MG PO TABS: 1.0000 | ORAL_TABLET | ORAL | 0 refills | Status: AC | PRN

## 2017-07-13 MED ORDER — CHLORHEXIDINE GLUCONATE 4 % EX LIQD: 60.0000 mL | Freq: Once | CUTANEOUS | Status: DC

## 2017-07-13 MED ORDER — HYDROCODONE-ACETAMINOPHEN 7.5-325 MG PO TABS: 1.0000 | ORAL_TABLET | Freq: Once | ORAL | Status: DC | PRN

## 2017-07-13 MED ORDER — LACTATED RINGERS IV SOLN: INTRAVENOUS | Status: DC

## 2017-07-13 MED ORDER — HYDROMORPHONE HCL 1 MG/ML IJ SOLN: 0.2500 mg | INTRAMUSCULAR | Status: DC | PRN

## 2017-07-13 MED ORDER — 0.9 % SODIUM CHLORIDE (POUR BTL) OPTIME
TOPICAL | Status: DC | PRN
  Administered 2017-07-13: 1000 mL

## 2017-07-13 MED ORDER — BUPIVACAINE HCL (PF) 0.5 % IJ SOLN
INTRAMUSCULAR | Status: DC | PRN
  Administered 2017-07-13: 10 mL

## 2017-07-13 MED ORDER — MIDAZOLAM HCL 2 MG/2ML IJ SOLN
INTRAMUSCULAR | Status: AC
  Filled 2017-07-13: qty 2

## 2017-07-13 MED ORDER — LACTATED RINGERS IV SOLN
INTRAVENOUS | Status: DC
  Administered 2017-07-13: 07:00:00 via INTRAVENOUS

## 2017-07-13 MED ORDER — BUPIVACAINE HCL (PF) 0.5 % IJ SOLN
INTRAMUSCULAR | Status: AC
  Filled 2017-07-13: qty 30

## 2017-07-13 MED ORDER — CEFAZOLIN SODIUM-DEXTROSE 2-4 GM/100ML-% IV SOLN
INTRAVENOUS | Status: AC
  Filled 2017-07-13: qty 100

## 2017-07-13 MED ORDER — PROPOFOL 10 MG/ML IV BOLUS
INTRAVENOUS | Status: AC
  Filled 2017-07-13: qty 20

## 2017-07-13 MED ORDER — PROMETHAZINE HCL 25 MG/ML IJ SOLN: 6.2500 mg | INTRAMUSCULAR | Status: DC | PRN

## 2017-07-13 MED ORDER — FENTANYL CITRATE (PF) 100 MCG/2ML IJ SOLN
INTRAMUSCULAR | Status: DC | PRN
  Administered 2017-07-13: 25 ug via INTRAVENOUS

## 2017-07-13 MED ORDER — FENTANYL CITRATE (PF) 100 MCG/2ML IJ SOLN
INTRAMUSCULAR | Status: AC
  Filled 2017-07-13: qty 2

## 2017-07-13 MED ORDER — PROPOFOL 500 MG/50ML IV EMUL
INTRAVENOUS | Status: DC | PRN
  Administered 2017-07-13: 50 ug/kg/min via INTRAVENOUS

## 2017-07-13 MED ORDER — CEFAZOLIN SODIUM-DEXTROSE 2-4 GM/100ML-% IV SOLN
2.0000 g | INTRAVENOUS | Status: AC
  Administered 2017-07-13: 2 g via INTRAVENOUS

## 2017-07-13 SURGICAL SUPPLY — 41 items
BAG HAMPER (MISCELLANEOUS) ×2 IMPLANT
BANDAGE ELASTIC 3 LF NS (GAUZE/BANDAGES/DRESSINGS) ×2 IMPLANT
BANDAGE ESMARK 4X12 BL STRL LF (DISPOSABLE) ×1 IMPLANT
BLADE SURG 15 STRL LF DISP TIS (BLADE) ×1 IMPLANT
BLADE SURG 15 STRL SS (BLADE) ×2
BNDG CMPR 12X4 ELC STRL LF (DISPOSABLE) ×1
BNDG CMPR MED 5X3 ELC HKLP NS (GAUZE/BANDAGES/DRESSINGS) ×1
BNDG COHESIVE 4X5 TAN STRL (GAUZE/BANDAGES/DRESSINGS) ×2 IMPLANT
BNDG ESMARK 4X12 BLUE STRL LF (DISPOSABLE) ×2
BNDG GAUZE ELAST 4 BULKY (GAUZE/BANDAGES/DRESSINGS) ×2 IMPLANT
CHLORAPREP W/TINT 26ML (MISCELLANEOUS) ×2 IMPLANT
CLOTH BEACON ORANGE TIMEOUT ST (SAFETY) ×2 IMPLANT
COVER LIGHT HANDLE STERIS (MISCELLANEOUS) ×4 IMPLANT
CUFF TOURNIQUET SINGLE 18IN (TOURNIQUET CUFF) ×2 IMPLANT
DECANTER SPIKE VIAL GLASS SM (MISCELLANEOUS) ×2 IMPLANT
DRAPE PROXIMA HALF (DRAPES) ×2 IMPLANT
DRSG XEROFORM 1X8 (GAUZE/BANDAGES/DRESSINGS) ×2 IMPLANT
ELECT NDL TIP 2.8 STRL (NEEDLE) ×1 IMPLANT
ELECT NEEDLE TIP 2.8 STRL (NEEDLE) ×2 IMPLANT
ELECT REM PT RETURN 9FT ADLT (ELECTROSURGICAL) ×2
ELECTRODE REM PT RTRN 9FT ADLT (ELECTROSURGICAL) ×1 IMPLANT
GAUZE SPONGE 4X4 12PLY STRL (GAUZE/BANDAGES/DRESSINGS) ×2 IMPLANT
GLOVE BIOGEL PI IND STRL 7.0 (GLOVE) ×2 IMPLANT
GLOVE BIOGEL PI INDICATOR 7.0 (GLOVE) ×2
GLOVE SKINSENSE NS SZ8.0 LF (GLOVE) ×1
GLOVE SKINSENSE STRL SZ8.0 LF (GLOVE) ×1 IMPLANT
GLOVE SS N UNI LF 8.5 STRL (GLOVE) ×2 IMPLANT
GOWN STRL REUS W/ TWL LRG LVL3 (GOWN DISPOSABLE) ×1 IMPLANT
GOWN STRL REUS W/TWL LRG LVL3 (GOWN DISPOSABLE) ×2
GOWN STRL REUS W/TWL XL LVL3 (GOWN DISPOSABLE) ×2 IMPLANT
HAND ALUMI XLG (SOFTGOODS) ×2 IMPLANT
KIT TURNOVER KIT A (KITS) ×2 IMPLANT
MANIFOLD NEPTUNE II (INSTRUMENTS) ×2 IMPLANT
NDL HYPO 21X1.5 SAFETY (NEEDLE) ×1 IMPLANT
NEEDLE HYPO 21X1.5 SAFETY (NEEDLE) ×2 IMPLANT
NS IRRIG 1000ML POUR BTL (IV SOLUTION) ×2 IMPLANT
PACK BASIC LIMB (CUSTOM PROCEDURE TRAY) ×2 IMPLANT
PAD ARMBOARD 7.5X6 YLW CONV (MISCELLANEOUS) ×2 IMPLANT
SET BASIN LINEN APH (SET/KITS/TRAYS/PACK) ×2 IMPLANT
SUT ETHILON 3 0 FSL (SUTURE) ×2 IMPLANT
SYR CONTROL 10ML LL (SYRINGE) ×2 IMPLANT

## 2017-07-13 NOTE — Anesthesia Procedure Notes (Signed)
Anesthesia Regional Block: Bier block (IV Regional)   Pre-Anesthetic Checklist: ,, timeout performed, Correct Patient, Correct Site, Correct Laterality, Correct Procedure,, site marked, surgical consent,, at surgeon's request  Laterality: Right     Needles:  Injection technique: Single-shot  Needle Type: Other      Needle Gauge: 22     Additional Needles:   Procedures:,,,,, intact distal pulses, Esmarch exsanguination, single tourniquet utilized,   Nerve Stimulator or Paresthesia:   Additional Responses:  Pulse checked post tourniquet inflation. IV NSL discontinued post injection. Narrative:   Performed by: Personally       

## 2017-07-13 NOTE — Discharge Instructions (Signed)
Elevate the hand for 72 hours.  Apply ice packs as needed to control swelling.  Moves your fingers every hour opening and closing the hand to make a closed fist 10 times per hour while awake Keep dressing clean and dry doctor will change in the office Carpal Tunnel Release, Care After Refer to this sheet in the next few weeks. These instructions provide you with information about caring for yourself after your procedure. Your health care provider may also give you more specific instructions. Your treatment has been planned according to current medical practices, but problems sometimes occur. Call your health care provider if you have any problems or questions after your procedure. What can I expect after the procedure? After your procedure, it is typical to have the following:  Pain.  Numbness.  Tingling.  Swelling.  Stiffness.  Bruising.  Follow these instructions at home:  Take medicines only as directed by your health care provider.  There are many different ways to close and cover an incision, including stitches (sutures), skin glue, and adhesive strips. Follow your health care provider's instructions about: ? Incision care. ? Bandage (dressing) changes and removal. ? Incision closure removal.  Wear a splint or a brace as directed by your surgeon. You may need to do this for 2-3 weeks.  Keep your hand raised (elevated) above the level of your heart while you are resting. Move your fingers often.  Avoid activities that cause hand pain.  Ask your surgeon when you can start to do all of your usual activities again, such as: ? Driving. ? Returning to work. ? Bathing and swimming.  Keep all follow-up visits as directed by your health care provider. This is important. You may need physical therapy for several months to speed healing and regain movement. Contact a health care provider if:  You have drainage, redness, swelling, or pain at your incision site.  You have a  fever.  You have chills.  Your pain medicine is not working.  Your symptoms do not go away after 2 months.  Your symptoms go away and then return. Get help right away if:  You have pain or numbness that is getting worse.  Your fingers change color.  You are not able to move your fingers. This information is not intended to replace advice given to you by your health care provider. Make sure you discuss any questions you have with your health care provider. Document Released: 10/08/2004 Document Revised: 08/27/2015 Document Reviewed: 11/06/2013 Elsevier Interactive Patient Education  2018 [employer].

## 2017-07-13 NOTE — Interval H&P Note (Signed)
History and Physical Interval Note:  07/13/2017 7:21 AM  Albert Garrison  has presented today for surgery, with the diagnosis of Right carpal tunnel syndrome  The various methods of treatment have been discussed with the patient and family. After consideration of risks, benefits and other options for treatment, the patient has consented to  Procedure(s): CARPAL TUNNEL RELEASE (Right) as a surgical intervention .  The patient's history has been reviewed, patient examined, no change in status, stable for surgery.  I have reviewed the patient's chart and labs.  Questions were answered to the patient's satisfaction.     [name]

## 2017-07-13 NOTE — Op Note (Signed)
07/13/2017  8:11 AM  PATIENT:  Albert Garrison  72 y.o. male  PRE-OPERATIVE DIAGNOSIS:  Right carpal tunnel syndrome  POST-OPERATIVE DIAGNOSIS:  Right carpal tunnel syndrome  PROCEDURE:  Procedure(s): CARPAL TUNNEL RELEASE (Right)   Carpal tunnel release right wrist  Preop diagnosis carpal tunnel syndrome right wrist postop diagnosis same  Procedure open carpal tunnel release right wrist  Surgeon Aline Brochure  Anesthesia regional Bier block  Operative findings compression of the right median nerve   Indications failure of conservative treatment to relieve pain and paresthesias and numbness and tingling of the right hand.  The patient was identified in the preop area we confirm the surgical site marked as right wrist. Chart update completed. Patient taken to surgery. he had 2 g of Ancef. After establishing a Bier block her arm was prepped with ChloraPrep.  Timeout executed completed and confirmed site.  A straight incision was made over the carpal tunnel in line with the radial border of the ring finger. Blunt dissection was carried out to find the distal aspect of the carpal tunnel. A blunted judgment was passed beneath the carpal tunnel. Sharp incision was then used to release the transverse carpal ligament. The contents of the carpal tunnel were inspected. The median nerve was compressed with slight discoloration.  The wound was irrigated and then closed with 3-0 nylon suture. We injected 10 mL of plain Marcaine on the radial side of the incision  A sterile bandage was applied and the tourniquet was released the color of the hand and capillary refill were normal  The patient was taken to the recovery room in stable condition  SURGEON:  Surgeon(s) and Role:    * Carole Civil, MD - Primary  PHYSICIAN ASSISTANT:   ASSISTANTS: none   ANESTHESIA:   regional  EBL:  none   BLOOD ADMINISTERED:none  DRAINS: none   LOCAL MEDICATIONS USED:  MARCAINE     SPECIMEN:  No  Specimen  DISPOSITION OF SPECIMEN:  N/A  COUNTS:  YES  TOURNIQUET:   Total Tourniquet Time Documented: Upper Arm (Right) - 23 minutes Total: Upper Arm (Right) - 23 minutes   DICTATION: .Viviann Spare Dictation  PLAN OF CARE: Discharge to home after PACU  PATIENT DISPOSITION:  PACU - hemodynamically stable.   Delay start of Pharmacological VTE agent (>24hrs) due to surgical blood loss or risk of bleeding: not applicable

## 2017-07-13 NOTE — Anesthesia Postprocedure Evaluation (Signed)
Anesthesia Post Note  Patient: Albert Garrison  Procedure(s) Performed: CARPAL TUNNEL RELEASE (Right Hand)  Patient location during evaluation: PACU Anesthesia Type: MAC Level of consciousness: awake and alert and oriented Pain management: pain level controlled Vital Signs Assessment: post-procedure vital signs reviewed and stable Respiratory status: spontaneous breathing Cardiovascular status: blood pressure returned to baseline and stable Postop Assessment: adequate PO intake Anesthetic complications: no Comments: Late entry.     Last Vitals:  Vitals:   07/13/17 0845 07/13/17 0846  BP: (!) 158/86 (!) 178/82  Pulse: (!) 52 (!) 53  Resp: 14 18  Temp:  36.4 C  SpO2: 95% 95%    Last Pain:  Vitals:   07/13/17 0846  TempSrc: Oral  PainSc:                  Tressie Stalker

## 2017-07-13 NOTE — Anesthesia Preprocedure Evaluation (Addendum)
Anesthesia Evaluation  Patient identified by MRN, date of birth, ID band Patient awake    Reviewed: Allergy & Precautions, H&P , NPO status , Patient's Chart, lab work & pertinent test results  Airway Mallampati: IV  TM Distance: >3 FB Neck ROM: full    Dental no notable dental hx. (+) Dental Advidsory Given, Teeth Intact, Poor Dentition   Pulmonary neg pulmonary ROS, sleep apnea , pneumonia, resolved, former smoker,    Pulmonary exam normal breath sounds clear to auscultation + decreased breath sounds      Cardiovascular Exercise Tolerance: Good hypertension, On Medications negative cardio ROS  + Valvular Problems/Murmurs MR  Rhythm:regular Rate:Bradycardia     Neuro/Psych Depression negative neurological ROS  negative psych ROS   GI/Hepatic negative GI ROS, Neg liver ROS, GERD  ,  Endo/Other  negative endocrine ROSdiabetes, Type 2  Renal/GU Renal diseasenegative Renal ROSRenal insufficiency  negative genitourinary   Musculoskeletal   Abdominal   Peds  Hematology negative hematology ROS (+)   Anesthesia Other Findings   Reproductive/Obstetrics negative OB ROS                             Anesthesia Physical Anesthesia Plan  ASA: III  Anesthesia Plan: Bier Block and MAC and Bier Block-Lidocaine Only   Post-op Pain Management:    Induction:   PONV Risk Score and Plan:   Airway Management Planned:   Additional Equipment:   Intra-op Plan:   Post-operative Plan:   Informed Consent: I have reviewed the patients History and Physical, chart, labs and discussed the procedure including the risks, benefits and alternatives for the proposed anesthesia with the patient or authorized representative who has indicated his/her understanding and acceptance.   Dental Advisory Given  Plan Discussed with: CRNA  Anesthesia Plan Comments:        Anesthesia Quick Evaluation

## 2017-07-13 NOTE — Brief Op Note (Signed)
07/13/2017  8:11 AM  PATIENT:  Albert Garrison  72 y.o. male  PRE-OPERATIVE DIAGNOSIS:  Right carpal tunnel syndrome  POST-OPERATIVE DIAGNOSIS:  Right carpal tunnel syndrome  PROCEDURE:  Procedure(s): CARPAL TUNNEL RELEASE (Right)   Carpal tunnel release right wrist  Preop diagnosis carpal tunnel syndrome right wrist postop diagnosis same  Procedure open carpal tunnel release right wrist  Surgeon Aline Brochure  Anesthesia regional Bier block  Operative findings compression of the right median nerve   Indications failure of conservative treatment to relieve pain and paresthesias and numbness and tingling of the right hand.  The patient was identified in the preop area we confirm the surgical site marked as right wrist. Chart update completed. Patient taken to surgery. he had 2 g of Ancef. After establishing a Bier block her arm was prepped with ChloraPrep.  Timeout executed completed and confirmed site.  A straight incision was made over the carpal tunnel in line with the radial border of the ring finger. Blunt dissection was carried out to find the distal aspect of the carpal tunnel. A blunted judgment was passed beneath the carpal tunnel. Sharp incision was then used to release the transverse carpal ligament. The contents of the carpal tunnel were inspected. The median nerve was compressed with slight discoloration.  The wound was irrigated and then closed with 3-0 nylon suture. We injected 10 mL of plain Marcaine on the radial side of the incision  A sterile bandage was applied and the tourniquet was released the color of the hand and capillary refill were normal  The patient was taken to the recovery room in stable condition  SURGEON:  Surgeon(s) and Role:    * Carole Civil, MD - Primary  PHYSICIAN ASSISTANT:   ASSISTANTS: none   ANESTHESIA:   regional  EBL:  none   BLOOD ADMINISTERED:none  DRAINS: none   LOCAL MEDICATIONS USED:  MARCAINE     SPECIMEN:  No  Specimen  DISPOSITION OF SPECIMEN:  N/A  COUNTS:  YES  TOURNIQUET:   Total Tourniquet Time Documented: Upper Arm (Right) - 23 minutes Total: Upper Arm (Right) - 23 minutes   DICTATION: .Albert Garrison Dictation  PLAN OF CARE: Discharge to home after PACU  PATIENT DISPOSITION:  PACU - hemodynamically stable.   Delay start of Pharmacological VTE agent (>24hrs) due to surgical blood loss or risk of bleeding: not applicable

## 2017-07-13 NOTE — Transfer of Care (Signed)
Immediate Anesthesia Transfer of Care Note  Patient: Albert Garrison  Procedure(s) Performed: CARPAL TUNNEL RELEASE (Right Hand)  Patient Location: PACU  Anesthesia Type:MAC  Level of Consciousness: awake and alert   Airway & Oxygen Therapy: Patient Spontanous Breathing and Patient connected to nasal cannula oxygen  Post-op Assessment: Report given to RN  Post vital signs: Reviewed and stable  Last Vitals:  Vitals Value Taken Time  BP 140/81 07/13/2017  8:15 AM  Temp    Pulse 52 07/13/2017  8:17 AM  Resp 12 07/13/2017  8:17 AM  SpO2 97 % 07/13/2017  8:17 AM  Vitals shown include unvalidated device data.  Last Pain:  Vitals:   07/13/17 0649  TempSrc: Oral      Patients Stated Pain Goal: 6 (01/09/11 1976)  Complications: No apparent anesthesia complications

## 2017-07-14 ENCOUNTER — Encounter (HOSPITAL_COMMUNITY): Payer: Self-pay | Admitting: Orthopedic Surgery

## 2017-07-21 DIAGNOSIS — R2232 Localized swelling, mass and lump, left upper limb: Secondary | ICD-10-CM | POA: Diagnosis not present

## 2017-07-21 DIAGNOSIS — R600 Localized edema: Secondary | ICD-10-CM | POA: Diagnosis not present

## 2017-07-21 DIAGNOSIS — N390 Urinary tract infection, site not specified: Secondary | ICD-10-CM | POA: Diagnosis not present

## 2017-07-21 DIAGNOSIS — Z0000 Encounter for general adult medical examination without abnormal findings: Secondary | ICD-10-CM | POA: Diagnosis not present

## 2017-07-21 DIAGNOSIS — R944 Abnormal results of kidney function studies: Secondary | ICD-10-CM | POA: Diagnosis not present

## 2017-07-21 DIAGNOSIS — G5600 Carpal tunnel syndrome, unspecified upper limb: Secondary | ICD-10-CM | POA: Diagnosis not present

## 2017-07-21 DIAGNOSIS — Z6826 Body mass index (BMI) 26.0-26.9, adult: Secondary | ICD-10-CM | POA: Diagnosis not present

## 2017-07-21 DIAGNOSIS — R399 Unspecified symptoms and signs involving the genitourinary system: Secondary | ICD-10-CM | POA: Diagnosis not present

## 2017-07-21 DIAGNOSIS — Z6825 Body mass index (BMI) 25.0-25.9, adult: Secondary | ICD-10-CM | POA: Diagnosis not present

## 2017-07-21 DIAGNOSIS — M25531 Pain in right wrist: Secondary | ICD-10-CM | POA: Diagnosis not present

## 2017-07-21 DIAGNOSIS — Z1331 Encounter for screening for depression: Secondary | ICD-10-CM | POA: Diagnosis not present

## 2017-07-21 DIAGNOSIS — M25532 Pain in left wrist: Secondary | ICD-10-CM | POA: Diagnosis not present

## 2017-07-27 ENCOUNTER — Ambulatory Visit (INDEPENDENT_AMBULATORY_CARE_PROVIDER_SITE_OTHER): Payer: Self-pay | Admitting: Orthopedic Surgery

## 2017-07-27 ENCOUNTER — Encounter: Payer: Self-pay | Admitting: Orthopedic Surgery

## 2017-07-27 DIAGNOSIS — Z9889 Other specified postprocedural states: Secondary | ICD-10-CM | POA: Insufficient documentation

## 2017-07-27 NOTE — Patient Instructions (Signed)
Keep dry for another week, and let us know if you have any problems Return in 2 weeks to see Dr Aline Brochure.

## 2017-07-27 NOTE — Progress Notes (Signed)
Patient presents 14 days s/p Right carpal tunnel release. Bulky surgical dressing removed and sutures removed without difficulty. There was an area palmar crease, where incision was slightly open, appears to be superficial. Steri strips placed, and patient instructed to keep area dry for another week and let us know if he has any problems. Patient states he has no pain with the operative wrist, but does complain of left hand/ wrist pain. He has made appointment for 2 week follow up for the post operative carpal tunnel and new exam of the left hand complaints.

## 2017-08-03 ENCOUNTER — Encounter: Payer: Self-pay | Admitting: Orthopedic Surgery

## 2017-08-11 ENCOUNTER — Ambulatory Visit: Payer: PPO | Admitting: Orthopedic Surgery

## 2017-08-14 ENCOUNTER — Ambulatory Visit (INDEPENDENT_AMBULATORY_CARE_PROVIDER_SITE_OTHER): Payer: PPO | Admitting: Orthopedic Surgery

## 2017-08-14 ENCOUNTER — Encounter: Payer: Self-pay | Admitting: Orthopedic Surgery

## 2017-08-14 ENCOUNTER — Ambulatory Visit (INDEPENDENT_AMBULATORY_CARE_PROVIDER_SITE_OTHER): Payer: PPO

## 2017-08-14 VITALS — BP 198/118 | HR 76 | Resp 18 | Ht 71.5 in | Wt 188.0 lb

## 2017-08-14 DIAGNOSIS — N390 Urinary tract infection, site not specified: Secondary | ICD-10-CM | POA: Diagnosis not present

## 2017-08-14 DIAGNOSIS — M1812 Unilateral primary osteoarthritis of first carpometacarpal joint, left hand: Secondary | ICD-10-CM

## 2017-08-14 DIAGNOSIS — Z9889 Other specified postprocedural states: Secondary | ICD-10-CM

## 2017-08-14 DIAGNOSIS — Z1331 Encounter for screening for depression: Secondary | ICD-10-CM | POA: Diagnosis not present

## 2017-08-14 DIAGNOSIS — I10 Essential (primary) hypertension: Secondary | ICD-10-CM | POA: Diagnosis not present

## 2017-08-14 DIAGNOSIS — Z6825 Body mass index (BMI) 25.0-25.9, adult: Secondary | ICD-10-CM | POA: Diagnosis not present

## 2017-08-14 DIAGNOSIS — M25531 Pain in right wrist: Secondary | ICD-10-CM | POA: Diagnosis not present

## 2017-08-14 DIAGNOSIS — Z6826 Body mass index (BMI) 26.0-26.9, adult: Secondary | ICD-10-CM | POA: Diagnosis not present

## 2017-08-14 DIAGNOSIS — R399 Unspecified symptoms and signs involving the genitourinary system: Secondary | ICD-10-CM | POA: Diagnosis not present

## 2017-08-14 DIAGNOSIS — M25532 Pain in left wrist: Secondary | ICD-10-CM | POA: Diagnosis not present

## 2017-08-14 DIAGNOSIS — Z0000 Encounter for general adult medical examination without abnormal findings: Secondary | ICD-10-CM | POA: Diagnosis not present

## 2017-08-14 DIAGNOSIS — R2232 Localized swelling, mass and lump, left upper limb: Secondary | ICD-10-CM | POA: Diagnosis not present

## 2017-08-14 DIAGNOSIS — G5600 Carpal tunnel syndrome, unspecified upper limb: Secondary | ICD-10-CM | POA: Diagnosis not present

## 2017-08-14 DIAGNOSIS — R600 Localized edema: Secondary | ICD-10-CM | POA: Diagnosis not present

## 2017-08-14 NOTE — Progress Notes (Signed)
Progress Note   Patient ID: GRIER VU, male   DOB: [birth year], 72 y.o.   MRN: [id number] Chief Complaint  Patient presents with  . Wrist Pain    left wrist pain  . Routine Post Op    right carpal tunnel release 07/13/17      Medical decision-making  Imaging:  Xrays ordered: y/n ? yes  Encounter Diagnoses  Name Primary?  . S/P carpal tunnel release right 07/13/17 Yes  . Left wrist pain   . Arthritis of carpometacarpal (CMC) joint of left thumb      PLAN: Recommend follow-up right carpal tunnel release in 1 month as well as follow-up CMC arthritis and arthritis left thumb and wrist  Injection left thumb CMC joint  Site confirmation completed verbal consent given  Injected 40 mg Depo-Medrol 3 cc 1% lidocaine left CMC joint.  Prior injection skin cleaned with alcohol and frozen with ethyl chloride no complications were noted  [name], MD 08/14/2017 3:58 PM     Chief Complaint  Patient presents with  . Wrist Pain    left wrist pain  . Routine Post Op    right carpal tunnel release 07/13/17     72 year old male status post carpal tunnel release abou 4 weeks ago presents with left wrist pain no history of trauma  He complains of pain at the [facility] joint of the left thumb at the wrist joint and at the ulnocarpal joint  He describes dull aching pain decreased range of motion decreased functional activities, pain present for several weeks    Review of Systems  Musculoskeletal: Positive for joint pain.  Skin: Negative.   Neurological: Negative for tingling.   No outpatient medications have been marked as taking for the 08/14/17 encounter (Office Visit) with Carole Civil, MD.    Past Medical History:  Diagnosis Date  . Arthritis   . Complicated UTI (urinary tract infection) Dec.2015  . Depression    hx. of  . Diabetes mellitus without complication (HCC)    diet controlled  . Foley catheter in place 03/20/2014  . GERD (gastroesophageal reflux  disease)    occasionally  . Heart murmur    hx. of at 17 yrs. old  . HOH (hard of hearing)   . Hypertension   . Pneumonia    hx. of  . Rheumatic fever    when 4 yrs. old  . Sleep apnea    does not use c-pap machine  . Sodium (Na) deficiency    managed in nursing center12/2015-Feb-2016     No Known Allergies  BP (!) 198/118   Pulse 76   Resp 18   Ht 5' 11.5" (1.816 m)   Wt 188 lb (85.3 kg)   BMI 25.86 kg/m    Physical Exam  Constitutional: He is oriented to person, place, and time. He appears well-developed and well-nourished.  Vital signs have been reviewed and are stable. Gen. appearance the patient is well-developed and well-nourished with normal grooming and hygiene.   Neurological: He is alert and oriented to person, place, and time.  Skin: Skin is warm and dry. No erythema.  Psychiatric: He has a normal mood and affect.  Vitals reviewed.   Ortho Exam   Examination of the left upper extremity inspection reveals prominent left ulna with tenderness at the ulnar carpal joint as well as tenderness over the radiocarpal joint and CMC joint tenderness with slight prominence of the base of the left thumb metacarpal  He has decreased  pinch strength  Decreased range of motion  Mild subluxation  Normal neurovascular exam  Skin is normal  Right wrist nontender full flexion extension rotation normal vascular exam

## 2017-08-22 ENCOUNTER — Observation Stay (HOSPITAL_COMMUNITY)
Admission: EM | Admit: 2017-08-22 | Discharge: 2017-08-24 | Disposition: A | Discharge status: Home / Self Care | Payer: PPO | Attending: Internal Medicine | Admitting: Internal Medicine

## 2017-08-22 ENCOUNTER — Other Ambulatory Visit: Payer: Self-pay

## 2017-08-22 ENCOUNTER — Emergency Department (HOSPITAL_COMMUNITY): Payer: PPO

## 2017-08-22 ENCOUNTER — Encounter (HOSPITAL_COMMUNITY): Payer: Self-pay | Admitting: *Deleted

## 2017-08-22 DIAGNOSIS — R410 Disorientation, unspecified: Secondary | ICD-10-CM | POA: Insufficient documentation

## 2017-08-22 DIAGNOSIS — Z79899 Other long term (current) drug therapy: Secondary | ICD-10-CM | POA: Insufficient documentation

## 2017-08-22 DIAGNOSIS — R7989 Other specified abnormal findings of blood chemistry: Secondary | ICD-10-CM | POA: Diagnosis present

## 2017-08-22 DIAGNOSIS — N179 Acute kidney failure, unspecified: Secondary | ICD-10-CM | POA: Insufficient documentation

## 2017-08-22 DIAGNOSIS — I10 Essential (primary) hypertension: Secondary | ICD-10-CM | POA: Diagnosis present

## 2017-08-22 DIAGNOSIS — R4020 Unspecified coma: Secondary | ICD-10-CM | POA: Diagnosis not present

## 2017-08-22 DIAGNOSIS — E1165 Type 2 diabetes mellitus with hyperglycemia: Secondary | ICD-10-CM | POA: Diagnosis not present

## 2017-08-22 DIAGNOSIS — J9811 Atelectasis: Secondary | ICD-10-CM | POA: Diagnosis not present

## 2017-08-22 DIAGNOSIS — R748 Abnormal levels of other serum enzymes: Secondary | ICD-10-CM | POA: Diagnosis not present

## 2017-08-22 DIAGNOSIS — Z8673 Personal history of transient ischemic attack (TIA), and cerebral infarction without residual deficits: Secondary | ICD-10-CM | POA: Diagnosis present

## 2017-08-22 DIAGNOSIS — E871 Hypo-osmolality and hyponatremia: Secondary | ICD-10-CM | POA: Diagnosis present

## 2017-08-22 DIAGNOSIS — R55 Syncope and collapse: Principal | ICD-10-CM | POA: Insufficient documentation

## 2017-08-22 DIAGNOSIS — R930 Abnormal findings on diagnostic imaging of skull and head, not elsewhere classified: Secondary | ICD-10-CM | POA: Diagnosis present

## 2017-08-22 DIAGNOSIS — R778 Other specified abnormalities of plasma proteins: Secondary | ICD-10-CM | POA: Diagnosis present

## 2017-08-22 DIAGNOSIS — T675XXA Heat exhaustion, unspecified, initial encounter: Secondary | ICD-10-CM | POA: Diagnosis not present

## 2017-08-22 DIAGNOSIS — G9340 Encephalopathy, unspecified: Secondary | ICD-10-CM | POA: Diagnosis present

## 2017-08-22 DIAGNOSIS — E119 Type 2 diabetes mellitus without complications: Secondary | ICD-10-CM | POA: Diagnosis not present

## 2017-08-22 DIAGNOSIS — I639 Cerebral infarction, unspecified: Secondary | ICD-10-CM | POA: Diagnosis present

## 2017-08-22 DIAGNOSIS — Z794 Long term (current) use of insulin: Secondary | ICD-10-CM

## 2017-08-22 DIAGNOSIS — E782 Mixed hyperlipidemia: Secondary | ICD-10-CM | POA: Diagnosis not present

## 2017-08-22 DIAGNOSIS — Z87891 Personal history of nicotine dependence: Secondary | ICD-10-CM | POA: Insufficient documentation

## 2017-08-22 DIAGNOSIS — R4182 Altered mental status, unspecified: Secondary | ICD-10-CM

## 2017-08-22 HISTORY — DX: Benign prostatic hyperplasia without lower urinary tract symptoms: N40.0

## 2017-08-22 HISTORY — DX: Chronic kidney disease, stage 2 (mild): N18.2

## 2017-08-22 HISTORY — DX: Gastrointestinal hemorrhage, unspecified: K92.2

## 2017-08-22 LAB — CBC WITH DIFFERENTIAL/PLATELET
BASOS ABS: 0 10*3/uL (ref 0.0–0.1)
BASOS PCT: 0 %
Eosinophils Absolute: 0 10*3/uL (ref 0.0–0.7)
Eosinophils Relative: 0 %
HEMATOCRIT: 41.2 % (ref 39.0–52.0)
HEMOGLOBIN: 13.9 g/dL (ref 13.0–17.0)
Lymphocytes Relative: 4 %
Lymphs Abs: 0.8 10*3/uL (ref 0.7–4.0)
MCH: 29.3 pg (ref 26.0–34.0)
MCHC: 33.7 g/dL (ref 30.0–36.0)
MCV: 86.9 fL (ref 78.0–100.0)
Monocytes Absolute: 1 10*3/uL (ref 0.1–1.0)
Monocytes Relative: 6 %
NEUTROS ABS: 15.4 10*3/uL — AB (ref 1.7–7.7)
NEUTROS PCT: 90 %
PLATELETS: 287 10*3/uL (ref 150–400)
RBC: 4.74 MIL/uL (ref 4.22–5.81)
RDW: 13.4 % (ref 11.5–15.5)
WBC: 17.2 10*3/uL — ABNORMAL HIGH (ref 4.0–10.5)

## 2017-08-22 LAB — COMPREHENSIVE METABOLIC PANEL
ALT: 13 U/L — ABNORMAL LOW (ref 17–63)
ANION GAP: 11 (ref 5–15)
AST: 25 U/L (ref 15–41)
Albumin: 4 g/dL (ref 3.5–5.0)
Alkaline Phosphatase: 50 U/L (ref 38–126)
BILIRUBIN TOTAL: 1.2 mg/dL (ref 0.3–1.2)
BUN: 24 mg/dL — ABNORMAL HIGH (ref 6–20)
CHLORIDE: 99 mmol/L — AB (ref 101–111)
CO2: 21 mmol/L — ABNORMAL LOW (ref 22–32)
Calcium: 9.1 mg/dL (ref 8.9–10.3)
Creatinine, Ser: 1.87 mg/dL — ABNORMAL HIGH (ref 0.61–1.24)
GFR calc Af Amer: 39 mL/min — ABNORMAL LOW (ref >60)
GFR, EST NON AFRICAN AMERICAN: 34 mL/min — AB (ref >60)
Glucose, Bld: 146 mg/dL — ABNORMAL HIGH (ref 65–99)
POTASSIUM: 4.3 mmol/L (ref 3.5–5.1)
Sodium: 131 mmol/L — ABNORMAL LOW (ref 135–145)
TOTAL PROTEIN: 6.8 g/dL (ref 6.5–8.1)

## 2017-08-22 LAB — ETHANOL

## 2017-08-22 LAB — TROPONIN I: Troponin I: 1.21 ng/mL (ref <0.03)

## 2017-08-22 NOTE — ED Notes (Signed)
Urinal at bedside, pt states he does need to urinate at this time

## 2017-08-22 NOTE — ED Notes (Addendum)
Critical result. Trop. 1.21. Dr. Alvino Chapel notified. No orders at this time.

## 2017-08-22 NOTE — ED Provider Notes (Addendum)
[facility] EMERGENCY DEPARTMENT Provider Note   CSN: [id number] Arrival date & time: 08/22/17  2143     History   Chief Complaint Chief Complaint  Patient presents with  . Loss of Consciousness    HPI Albert Garrison is a 72 y.o. male. Level 5 caveat due to AMS. HPI Patient brought in with syncope and confusion.  Patient is somewhat confused over what happened but states he was out hiking and then lost the Trail.  States he sweated out everything he had.  Reportedly was found wandering in someone's yard and when EMS was called he had a syncopal episode.  Patient states he does not remember this.  Does not think he had chest pain.  States he is feeling somewhat better now but still somewhat confused. Past Medical History:  Diagnosis Date  . Arthritis   . Complicated UTI (urinary tract infection) Dec.2015  . Depression    hx. of  . Diabetes mellitus without complication (HCC)    diet controlled  . Foley catheter in place 03/20/2014  . GERD (gastroesophageal reflux disease)    occasionally  . Heart murmur    hx. of at 17 yrs. old  . HOH (hard of hearing)   . Hypertension   . Pneumonia    hx. of  . Rheumatic fever    when 4 yrs. old  . Sleep apnea    does not use c-pap machine  . Sodium (Na) deficiency    managed in nursing center12/2015-Feb-2016    Patient Active Problem List   Diagnosis Date Noted  . S/P carpal tunnel release right 07/13/17 07/27/2017  . AKI (acute kidney injury) ([place]) 11/29/2015  . Lower GI bleed 11/28/2015  . Rectal bleed 11/28/2015  . Rectal bleeding 11/28/2015  . Benign prostatic hypertrophy with urinary retention 01/20/2015  . Urinary retention 05/21/2014  . ARF (acute renal failure) ([place]) 03/26/2014  . IDDM (insulin dependent diabetes mellitus) ([place]) 03/26/2014  . UTI (lower urinary tract infection) 03/24/2014  . Hyponatremia 03/24/2014  . Acute encephalopathy 03/24/2014    Past Surgical History:  Procedure Laterality Date  . CARPAL  TUNNEL RELEASE Left 2010  . CARPAL TUNNEL RELEASE Right 07/13/2017   Procedure: CARPAL TUNNEL RELEASE;  Surgeon: Carole Civil, MD;  Location: [facility] ORS;  Service: Orthopedics;  Laterality: Right;  . CERVICAL SPINE SURGERY  2010  . COLONOSCOPY WITH PROPOFOL N/A 11/30/2015   Procedure: COLONOSCOPY WITH PROPOFOL;  Surgeon: Daneil Dolin, MD;  Location: [facility] ENDO SUITE;  Service: Endoscopy;  Laterality: N/A;  . KNEE ARTHROSCOPY  1989  . LUMBAR LAMINECTOMY  1986  . Nose surgery - broken nose    . POLYPECTOMY  11/30/2015   Procedure: POLYPECTOMY;  Surgeon: Daneil Dolin, MD;  Location: [facility] ENDO SUITE;  Service: Endoscopy;;  polyp at ascending colon, rectal polyp  . TRANSURETHRAL INCISION OF PROSTATE N/A 01/20/2015   Procedure: TRANSURETHRAL INCISION OF THE PROSTATE (TUIP);  Surgeon: Irine Seal, MD;  Location: [facility] ORS;  Service: Urology;  Laterality: N/A;        Home Medications    Prior to Admission medications   Medication Sig Start Date End Date Taking? Authorizing Provider  gabapentin (NEURONTIN) 300 MG capsule Take 300 mg by mouth 3 (three) times daily.    [provider]  hydrochlorothiazide (MICROZIDE) 12.5 MG capsule Take 12.5 mg by mouth daily.    [provider]  telmisartan (MICARDIS) 40 MG tablet Take 40 mg by mouth daily. 08/14/17   [provider]    Family History History reviewed. No pertinent family history.  Social History Social History   Tobacco Use  . Smoking status: Former Smoker    Packs/day: 1.50    Years: 14.00    Pack years: 21.00    Types: Cigarettes    Last attempt to quit: 08/06/1975    Years since quitting: 42.0  . Smokeless tobacco: Never Used  Substance Use Topics  . Alcohol use: Yes    Alcohol/week: 1.8 oz    Types: 3 Cans of beer per week    Comment: 3 12 oz cans of beer each week.None since 03/2014  . Drug use: No     Allergies   Patient has no known allergies.   Review of Systems Review of Systems  Unable to  perform ROS: Mental status change     Physical Exam Updated Vital Signs BP (!) 155/87   Pulse 68   Temp (!) 96.9 F (36.1 C) Comment: rectal  Resp 17   Ht 5' 11.5" (1.816 m)   Wt 85.3 kg (188 lb)   SpO2 94%   BMI 25.86 kg/m   Physical Exam  Constitutional: He appears well-developed and well-nourished.  HENT:  Head: Atraumatic.  Neck: Neck supple.  Cardiovascular: Normal rate.  Pulmonary/Chest: Effort normal.  Abdominal: Soft.  Musculoskeletal: He exhibits no tenderness.  Neurological: He is alert.  Patient awake and answering questions, however has some mild confusion.  Able to tell me his name and his birthday and where he is but cannot tell me what the date is.  He repeats the year of his birth.  Skin: Skin is warm. Capillary refill takes less than 2 seconds.     ED Treatments / Results  Labs (all labs ordered are listed, but only abnormal results are displayed) Labs Reviewed  COMPREHENSIVE METABOLIC PANEL - Abnormal; Notable for the following components:      Result Value   Sodium 131 (*)    Chloride 99 (*)    CO2 21 (*)    Glucose, Bld 146 (*)    BUN 24 (*)    Creatinine, Ser 1.87 (*)    ALT 13 (*)    GFR calc non Af Amer 34 (*)    GFR calc Af Amer 39 (*)    All other components within normal limits  URINALYSIS, ROUTINE W REFLEX MICROSCOPIC - Abnormal; Notable for the following components:   Hgb urine dipstick SMALL (*)    Ketones, ur 5 (*)    All other components within normal limits  CBC WITH DIFFERENTIAL/PLATELET - Abnormal; Notable for the following components:   WBC 17.2 (*)    Neutro Abs 15.4 (*)    All other components within normal limits  TROPONIN I - Abnormal; Notable for the following components:   Troponin I 1.21 (*)    All other components within normal limits  ETHANOL  RAPID URINE DRUG SCREEN, HOSP PERFORMED    EKG EKG Interpretation  Date/Time:  Tuesday Aug 22 2017 22:28:21 EDT Ventricular Rate:  67 PR Interval:    QRS  Duration: 106 QT Interval:  461 QTC Calculation: 487 R Axis:   -27 Text Interpretation:  Sinus rhythm Ventricular premature complex Borderline left axis deviation Abnormal R-wave progression, early transition Borderline prolonged QT interval Confirmed by Davonna Belling [phone]) on 08/22/2017 11:39:40 PM   Radiology Dg Chest 2 View  Result Date: 08/22/2017 CLINICAL DATA:  Syncope. EXAM: CHEST - 2 VIEW COMPARISON:  03/24/2014 FINDINGS: The cardiomediastinal  contours are normal. Minor left lung base atelectasis. Pulmonary vasculature is normal. No consolidation, pleural effusion, or pneumothorax. No acute osseous abnormalities are seen. Cervical spine fusion hardware partially included. IMPRESSION: Minor left lung base atelectasis. Electronically Signed   By: Jeb Levering M.D.   On: 08/22/2017 23:20   Ct Head Wo Contrast  Result Date: 08/22/2017 CLINICAL DATA:  Altered level of consciousness. Found wandering in someone's yard, syncopal episode upon arrival of EMS. EXAM: CT HEAD WITHOUT CONTRAST TECHNIQUE: Contiguous axial images were obtained from the base of the skull through the vertex without intravenous contrast. COMPARISON:  None. FINDINGS: Brain: Ill-defined 7 x 16 mm low-density in the right basal ganglia suspicious for acute/subacute ischemia. No hemorrhage. Mega cisterna magna is incidentally noted. Normal for age atrophy. No midline shift or mass effect. No subdural or extra-axial collection. Vascular: Atherosclerosis of skullbase vasculature without hyperdense vessel or abnormal calcification. Skull: No fracture or focal lesion. Sinuses/Orbits: Paranasal sinuses and mastoid air cells are clear. The visualized orbits are unremarkable. Other: None. IMPRESSION: Findings suspicious for acute/subacute ischemia in the right basal ganglia. No hemorrhage. Electronically Signed   By: Jeb Levering M.D.   On: 08/22/2017 23:15    Procedures Procedures (including critical care  time)  Medications Ordered in ED Medications - No data to display   Initial Impression / Assessment and Plan / ED Course  I have reviewed the triage vital signs and the nursing notes.  Pertinent labs & imaging results that were available during my care of the patient were reviewed by me and considered in my medical decision making (see chart for details).     Patient presents after confusion and syncope.  He does however have an elevated troponin and a CT scan that showed possible acute to subacute stroke.  Discussed with Dr. Olevia Bowens, and will admit patient.  Also has acute kidney injury with elevated creatinine.  Discussed with Dr. Charissa Bash from cardiology.  Could either stay at [facility] or come down to [facility] depending on cardiology availability.  Final Clinical Impressions(s) / ED Diagnoses   Final diagnoses:  Syncope, unspecified syncope type  Elevated troponin  Acute kidney injury [facility])    ED Discharge Orders    None       Davonna Belling, MD 08/22/17 2346    Davonna Belling, MD 08/23/17 [phone]

## 2017-08-22 NOTE — ED Triage Notes (Signed)
Pt brought in by [place] for c/o syncopal episode; pt was founding wandering into someone's yard and they dialed 911; when ems arrived pt had a syncopal episode and was found to be diaphoretic and cold and clammy; pt is alert and oriented

## 2017-08-23 ENCOUNTER — Observation Stay (HOSPITAL_COMMUNITY): Payer: PPO

## 2017-08-23 ENCOUNTER — Observation Stay (HOSPITAL_BASED_OUTPATIENT_CLINIC_OR_DEPARTMENT_OTHER): Payer: PPO

## 2017-08-23 ENCOUNTER — Observation Stay (HOSPITAL_COMMUNITY)
Admit: 2017-08-23 | Discharge: 2017-08-23 | Disposition: A | Discharge status: Home / Self Care | Payer: PPO | Attending: Internal Medicine | Admitting: Internal Medicine

## 2017-08-23 ENCOUNTER — Encounter (HOSPITAL_COMMUNITY): Payer: Self-pay | Admitting: Internal Medicine

## 2017-08-23 ENCOUNTER — Other Ambulatory Visit: Payer: Self-pay

## 2017-08-23 DIAGNOSIS — R404 Transient alteration of awareness: Secondary | ICD-10-CM | POA: Diagnosis not present

## 2017-08-23 DIAGNOSIS — R778 Other specified abnormalities of plasma proteins: Secondary | ICD-10-CM | POA: Diagnosis present

## 2017-08-23 DIAGNOSIS — E119 Type 2 diabetes mellitus without complications: Secondary | ICD-10-CM

## 2017-08-23 DIAGNOSIS — I361 Nonrheumatic tricuspid (valve) insufficiency: Secondary | ICD-10-CM | POA: Diagnosis not present

## 2017-08-23 DIAGNOSIS — I6389 Other cerebral infarction: Secondary | ICD-10-CM | POA: Diagnosis not present

## 2017-08-23 DIAGNOSIS — R4182 Altered mental status, unspecified: Secondary | ICD-10-CM | POA: Diagnosis not present

## 2017-08-23 DIAGNOSIS — R410 Disorientation, unspecified: Secondary | ICD-10-CM

## 2017-08-23 DIAGNOSIS — R569 Unspecified convulsions: Secondary | ICD-10-CM | POA: Diagnosis not present

## 2017-08-23 DIAGNOSIS — R930 Abnormal findings on diagnostic imaging of skull and head, not elsewhere classified: Secondary | ICD-10-CM | POA: Diagnosis present

## 2017-08-23 DIAGNOSIS — G4730 Sleep apnea, unspecified: Secondary | ICD-10-CM | POA: Insufficient documentation

## 2017-08-23 DIAGNOSIS — I371 Nonrheumatic pulmonary valve insufficiency: Secondary | ICD-10-CM

## 2017-08-23 DIAGNOSIS — I6522 Occlusion and stenosis of left carotid artery: Secondary | ICD-10-CM | POA: Diagnosis not present

## 2017-08-23 DIAGNOSIS — R55 Syncope and collapse: Secondary | ICD-10-CM | POA: Diagnosis present

## 2017-08-23 DIAGNOSIS — R7989 Other specified abnormal findings of blood chemistry: Secondary | ICD-10-CM | POA: Diagnosis present

## 2017-08-23 DIAGNOSIS — I10 Essential (primary) hypertension: Secondary | ICD-10-CM

## 2017-08-23 DIAGNOSIS — R748 Abnormal levels of other serum enzymes: Secondary | ICD-10-CM

## 2017-08-23 DIAGNOSIS — N179 Acute kidney failure, unspecified: Secondary | ICD-10-CM

## 2017-08-23 LAB — CK: CK TOTAL: 598 U/L — AB (ref 49–397)

## 2017-08-23 LAB — URINALYSIS, ROUTINE W REFLEX MICROSCOPIC
BILIRUBIN URINE: NEGATIVE
Bacteria, UA: NONE SEEN
Glucose, UA: NEGATIVE mg/dL
KETONES UR: 5 mg/dL — AB
Leukocytes, UA: NEGATIVE
NITRITE: NEGATIVE
PH: 6 (ref 5.0–8.0)
Protein, ur: NEGATIVE mg/dL
SPECIFIC GRAVITY, URINE: 1.006 (ref 1.005–1.030)

## 2017-08-23 LAB — TROPONIN I
TROPONIN I: 0.08 ng/mL — AB (ref <0.03)
Troponin I: 0.07 ng/mL (ref <0.03)

## 2017-08-23 LAB — NA AND K (SODIUM & POTASSIUM), RAND UR
Potassium Urine: 22 mmol/L
Sodium, Ur: 37 mmol/L

## 2017-08-23 LAB — LIPID PANEL
CHOL/HDL RATIO: 4.6 ratio
Cholesterol: 169 mg/dL (ref 0–200)
HDL: 37 mg/dL — ABNORMAL LOW (ref >40)
LDL CALC: 108 mg/dL — AB (ref 0–99)
Triglycerides: 118 mg/dL (ref <150)
VLDL: 24 mg/dL (ref 0–40)

## 2017-08-23 LAB — GLUCOSE, CAPILLARY
GLUCOSE-CAPILLARY: 164 mg/dL — AB (ref 65–99)
Glucose-Capillary: 115 mg/dL — ABNORMAL HIGH (ref 65–99)
Glucose-Capillary: 144 mg/dL — ABNORMAL HIGH (ref 65–99)
Glucose-Capillary: 142 mg/dL — ABNORMAL HIGH (ref 65–99)

## 2017-08-23 LAB — PHOSPHORUS: Phosphorus: 3.9 mg/dL (ref 2.5–4.6)

## 2017-08-23 LAB — RAPID URINE DRUG SCREEN, HOSP PERFORMED
AMPHETAMINES: NOT DETECTED
BARBITURATES: NOT DETECTED
Benzodiazepines: NOT DETECTED
Cocaine: NOT DETECTED
Opiates: NOT DETECTED
Tetrahydrocannabinol: NOT DETECTED

## 2017-08-23 LAB — HEMOGLOBIN A1C
Hgb A1c MFr Bld: 7.1 % — ABNORMAL HIGH (ref 4.8–5.6)
MEAN PLASMA GLUCOSE: 157.07 mg/dL

## 2017-08-23 LAB — MAGNESIUM: Magnesium: 2.2 mg/dL (ref 1.7–2.4)

## 2017-08-23 LAB — ECHOCARDIOGRAM COMPLETE
HEIGHTINCHES: 71 in
WEIGHTICAEL: 2931.24 [oz_av]

## 2017-08-23 LAB — MRSA PCR SCREENING: MRSA by PCR: NEGATIVE

## 2017-08-23 MED ORDER — ACETAMINOPHEN 160 MG/5ML PO SOLN: 650.0000 mg | ORAL | Status: DC | PRN

## 2017-08-23 MED ORDER — ENOXAPARIN SODIUM 40 MG/0.4ML ~~LOC~~ SOLN
40.0000 mg | SUBCUTANEOUS | Status: DC
  Administered 2017-08-23 – 2017-08-24 (×2): 40 mg via SUBCUTANEOUS
  Filled 2017-08-23 (×2): qty 0.4

## 2017-08-23 MED ORDER — ONDANSETRON HCL 4 MG PO TABS: 4.0000 mg | ORAL_TABLET | Freq: Four times a day (QID) | ORAL | Status: DC | PRN

## 2017-08-23 MED ORDER — AMLODIPINE BESYLATE 5 MG PO TABS
10.0000 mg | ORAL_TABLET | Freq: Every day | ORAL | Status: DC
  Administered 2017-08-23 – 2017-08-24 (×2): 10 mg via ORAL
  Filled 2017-08-23 (×2): qty 2

## 2017-08-23 MED ORDER — INSULIN ASPART 100 UNIT/ML ~~LOC~~ SOLN
0.0000 [IU] | Freq: Three times a day (TID) | SUBCUTANEOUS | Status: DC
  Administered 2017-08-23: 3 [IU] via SUBCUTANEOUS
  Administered 2017-08-23 – 2017-08-24 (×2): 2 [IU] via SUBCUTANEOUS

## 2017-08-23 MED ORDER — ACETAMINOPHEN 325 MG PO TABS: 650.0000 mg | ORAL_TABLET | ORAL | Status: DC | PRN

## 2017-08-23 MED ORDER — ACETAMINOPHEN 650 MG RE SUPP: 650.0000 mg | RECTAL | Status: DC | PRN

## 2017-08-23 MED ORDER — SODIUM CHLORIDE 0.9 % IV SOLN
INTRAVENOUS | Status: DC
  Administered 2017-08-23 – 2017-08-24 (×4): via INTRAVENOUS

## 2017-08-23 MED ORDER — ONDANSETRON HCL 4 MG/2ML IJ SOLN: 4.0000 mg | Freq: Four times a day (QID) | INTRAMUSCULAR | Status: DC | PRN

## 2017-08-23 MED ORDER — SODIUM CHLORIDE 0.9 % IV BOLUS
1000.0000 mL | Freq: Once | INTRAVENOUS | Status: AC
  Administered 2017-08-23: 1000 mL via INTRAVENOUS

## 2017-08-23 MED ORDER — STROKE: EARLY STAGES OF RECOVERY BOOK
Freq: Once | Status: AC
  Administered 2017-08-23: 06:00:00
  Filled 2017-08-23 (×2): qty 1

## 2017-08-23 MED ORDER — ENOXAPARIN SODIUM 40 MG/0.4ML ~~LOC~~ SOLN: 40.0000 mg | SUBCUTANEOUS | Status: DC

## 2017-08-23 MED ORDER — ASPIRIN 325 MG PO TABS
325.0000 mg | ORAL_TABLET | Freq: Every day | ORAL | Status: DC
  Administered 2017-08-23 – 2017-08-24 (×2): 325 mg via ORAL
  Filled 2017-08-23 (×3): qty 1

## 2017-08-23 MED ORDER — ATORVASTATIN CALCIUM 20 MG PO TABS
20.0000 mg | ORAL_TABLET | Freq: Every day | ORAL | Status: DC
  Administered 2017-08-23: 20 mg via ORAL
  Filled 2017-08-23: qty 1

## 2017-08-23 NOTE — Progress Notes (Signed)
*  PRELIMINARY RESULTS* Echocardiogram 2D Echocardiogram has been performed.  Albert Garrison 08/23/2017, 4:13 PM

## 2017-08-23 NOTE — Evaluation (Signed)
Speech Language Pathology Evaluation Patient Details Name: Albert Garrison MRN: [id number] DOB: 17-Jan-1946 Today's Date: 08/23/2017 Time: 1027-2536 SLP Time Calculation (min) (ACUTE ONLY): 39 min  Problem List:  Patient Active Problem List   Diagnosis Date Noted  . Altered mental status 08/23/2017  . Sleep apnea 08/23/2017  . Elevated troponin 08/23/2017  . Abnormal CT of the head 08/23/2017  . Hypertension 08/23/2017  . Type 2 diabetes mellitus ([place]) 08/23/2017  . Syncope 08/23/2017  . S/P carpal tunnel release right 07/13/17 07/27/2017  . AKI (acute kidney injury) ([place]) 11/29/2015  . Lower GI bleed 11/28/2015  . Rectal bleed 11/28/2015  . Rectal bleeding 11/28/2015  . Benign prostatic hypertrophy with urinary retention 01/20/2015  . Urinary retention 05/21/2014  . ARF (acute renal failure) ([place]) 03/26/2014  . IDDM (insulin dependent diabetes mellitus) ([place]) 03/26/2014  . UTI (lower urinary tract infection) 03/24/2014  . Hyponatremia 03/24/2014  . Acute encephalopathy 03/24/2014   Past Medical History:  Past Medical History:  Diagnosis Date  . Arthritis   . BPH (benign prostatic hyperplasia)   . CKD (chronic kidney disease), stage II   . Complicated UTI (urinary tract infection) Dec.2015  . Depression    hx. of  . Diabetes mellitus without complication (HCC)    diet controlled  . Foley catheter in place 03/20/2014  . GERD (gastroesophageal reflux disease)    occasionally  . Heart murmur    hx. of at 17 yrs. old  . HOH (hard of hearing)   . Hypertension   . Lower GI bleed 2017   a. ? due to polyp.  . Pneumonia    hx. of  . Rheumatic fever    when 4 yrs. old  . Sleep apnea    does not use c-pap machine  . Sodium (Na) deficiency    managed in nursing center12/2015-Feb-2016   Past Surgical History:  Past Surgical History:  Procedure Laterality Date  . CARPAL TUNNEL RELEASE Left 2010  . CARPAL TUNNEL RELEASE Right 07/13/2017   Procedure: CARPAL TUNNEL RELEASE;   Surgeon: Carole Civil, MD;  Location: [facility];  Service: Orthopedics;  Laterality: Right;  . CERVICAL SPINE SURGERY  2010  . COLONOSCOPY WITH PROPOFOL N/A 11/30/2015   Procedure: COLONOSCOPY WITH PROPOFOL;  Surgeon: Daneil Dolin, MD;  Location: [facility];  Service: Endoscopy;  Laterality: N/A;  . KNEE ARTHROSCOPY  1989  . LUMBAR LAMINECTOMY  1986  . Nose surgery - broken nose    . POLYPECTOMY  11/30/2015   Procedure: POLYPECTOMY;  Surgeon: Daneil Dolin, MD;  Location: [facility];  Service: Endoscopy;;  polyp at ascending colon, rectal polyp  . TRANSURETHRAL INCISION OF PROSTATE N/A 01/20/2015   Procedure: TRANSURETHRAL INCISION OF THE PROSTATE (TUIP);  Surgeon: Irine Seal, MD;  Location: [facility];  Service: Urology;  Laterality: N/A;   HPI:  Albert Garrison is a 72 y.o. male with medical history significant of osteoarthritis, history of complicated UTI, history of depression, diet-controlled type 2 diabetes, GERD, hypertension, history of pneumonia, history of rheumatic fever at age 68, history of sleep apnea but does not use CPAP, hyponatremia who is brought to the emergency department via EMS after 911 was called due to the patient found wandering on someone else's yard. Apparently, the patient went hiking, but then lost the Trail. He stated that he is sweated a lot. Then ended up in somebody else's yard, EMS was called and the patient subsequently had a syncopal episode. He is  unable to tell much history, but denies headache, chest pain, abdominal pain or any other type of pain when asked now. However, obtaining history from vision remains difficult, as he continues to be confused. MRI positive for acute R CVA.  EEG completed this day.  Findings indicated Left ventricle: Anbormal septal motion,  Aortic valve: There was mild regurgitation.    Assessment / Plan / Recommendation Clinical Impression  Pt was alert and oriented x4 during assessment.  Pt's family members were present  and stated he had a "good memory" prior to hospitalization.  He presents with cognitive linguistic deficits characterized by moderate deficits in working memory, verbal fluency, sustained attention, word finding deficits in conversation, divergent naming and following multi-step directions.  Automatic speech is also impaired and speech is mildly dysarthric. Given the aforementioned deficits, recommend following Pt for speech-language services during hospital stay and follow up with a speech-language pathologist via home health.  Pt's sister stated he will stay with her until able to return to his home.    SLP Assessment  SLP Recommendation/Assessment: Patient needs continued Speech Lanaguage Pathology Services SLP Visit Diagnosis: Cognitive communication deficit (R41.841)    Follow Up Recommendations  Home health SLP    Frequency and Duration min 2x/week  1 week      SLP Evaluation Cognition  Overall Cognitive Status: Impaired/Different from baseline Arousal/Alertness: Awake/alert Orientation Level: Oriented X4 Attention: Sustained Sustained Attention: Impaired Sustained Attention Impairment: Functional basic Memory: Impaired Awareness: Appears intact Problem Solving: Appears intact Executive Function: Decision Making Decision Making: Appears intact Behaviors: Perseveration Safety/Judgment: Appears intact       Comprehension  Auditory Comprehension Overall Auditory Comprehension: Impaired Yes/No Questions: Within Functional Limits Commands: Impaired Two Step Basic Commands: 25-49% accurate Visual Recognition/Discrimination Discrimination: Not tested Reading Comprehension Reading Status: Not tested    Expression Expression Primary Mode of Expression: Verbal Verbal Expression Overall Verbal Expression: Impaired Initiation: Impaired Automatic Speech: Counting;Month of year Level of Generative/Spontaneous Verbalization: Sentence Repetition: Impaired Level of Impairment:  Sentence level Naming: Impairment Divergent: 25-49% accurate Verbal Errors: Perseveration;Aware of errors Written Expression Dominant Hand: Left   Oral / Motor  Oral Motor/Sensory Function Overall Oral Motor/Sensory Function: Generalized oral weakness Facial ROM: Reduced left Facial Symmetry: Abnormal symmetry left Facial Strength: Reduced left Motor Speech Overall Motor Speech: Impaired Respiration: Within functional limits Phonation: Normal Resonance: Within functional limits Articulation: Impaired Level of Impairment: Word Intelligibility: Intelligibility reduced   GO                   Joneen Boers  M.A., CCC-SLP [email or web address] [email or web address] Darnell Stimson 08/23/2017, 6:04 PM

## 2017-08-23 NOTE — Progress Notes (Signed)
SLP Cancellation Note  Patient Details Name: KHOLE BRANCH MRN: [id number] DOB: [birth year]   Cancelled treatment:       Reason Eval/Treat Not Completed: Patient at procedure or test/unavailable; Pt having EEG currently and SLP unable to complete SLE at this time. MRI does show stroke, however nursing reports seemingly adequate cognitive linguistic skills. SLP will check back later.   Thank you,  Genene Churn, [place]    [place] 08/23/2017, 1:03 PM

## 2017-08-23 NOTE — Plan of Care (Signed)
  Problem: Acute Rehab PT Goals(only PT should resolve) Goal: Patient Will Transfer Sit To/From Stand Outcome: Progressing Flowsheets (Taken 08/23/2017 1501) Patient will transfer sit to/from stand: Independently Goal: Pt Will Transfer Bed To Chair/Chair To Bed Outcome: Progressing Flowsheets (Taken 08/23/2017 1501) Pt will Transfer Bed to Chair/Chair to Bed: Independently Goal: Pt Will Ambulate Outcome: Progressing Flowsheets (Taken 08/23/2017 1501) Pt will Ambulate: > 125 feet;Independently   3:02 PM, 08/23/17 Lonell Grandchild, MPT Physical Therapist with [facility] [phone] office [phone] mobile phone

## 2017-08-23 NOTE — Procedures (Signed)
  [facility] A. Merlene Laughter, MD     [email or web address]           HISTORY: This is a 72 year old who presents with altered mental status.  The study is being done to evaluate for seizures as the etiology.  MEDICATIONS: Scheduled Meds: . amLODipine  10 mg Oral Daily  . aspirin  325 mg Oral Daily  . atorvastatin  20 mg Oral q1800  . enoxaparin (LOVENOX) injection  40 mg Subcutaneous Q24H  . insulin aspart  0-15 Units Subcutaneous TID WC   Continuous Infusions: . sodium chloride 100 mL/hr at 08/23/17 2041   PRN Meds:.acetaminophen **OR** acetaminophen (TYLENOL) oral liquid 160 mg/5 mL **OR** acetaminophen, ondansetron **OR** ondansetron (ZOFRAN) IV  Prior to Admission medications   Medication Sig Start Date End Date Taking? Authorizing Provider  gabapentin (NEURONTIN) 300 MG capsule Take 300 mg by mouth 3 (three) times daily.   Yes [provider]  hydrochlorothiazide (MICROZIDE) 12.5 MG capsule Take 12.5 mg by mouth daily.   Yes [provider]      ANALYSIS: A 16 channel recording using standard 10 20 measurements is conducted for 22 minutes. There is a well-formed posterior dominant rhythm of 10 hertz which attenuates with eye-opening.  There is beta activity observed in the frontal areas.  Awake and sleep architecture are observed.  Occasional spindles and K complexes are observed.  Photic stimulation and hyperventilation are not carried out.  There is no focal or lateralized slowing.  There is no epileptiform activity is observed.   IMPRESSION: This is a normal recording of awake and sleep states.        Dannell Raczkowski A. Merlene Laughter, M.D.  Diplomate, Tax adviser of Psychiatry and Neurology ( Neurology).

## 2017-08-23 NOTE — Consult Note (Signed)
Cardiology Consultation:   Patient ID: Albert Garrison; [id number]; 03/03/1946   Admit date: 08/22/2017 Date of Consult: 08/23/2017  Primary Care Provider: Celene Squibb, MD Primary Cardiologist: New to Dr. Johnsie Cancel  Chief Complaint: found wandering in yard  Patient Profile:   Albert Garrison is a 72 y.o. male with a hx of OA, complicated UTI, diet-controlled DM, HTN, GERD, rheumatic fever, OSA (does not use CPAP), hyponatremia (unclear prior workup), BPH, urinary retention, lower GIB 2017 (possibly related to polyp), probable CKD stage II who is being seen today for the evaluation of elevated troponin/possible syncope at the rest of Dr. Olevia Bowens.  History of Present Illness:   Albert Garrison was brought to the emergency department via EMS after 911 was called due to the patient found wandering on someone else's yard. He had gone hiking apparently and lost the trail and ended up in someone's yard. Upon arrival to the ER he was confused and unable to provide a detailed history. Triage notes indicate when EMS arrived patient had a syncopal episode and was found to be diaphoretic and cold. No further details were available regarding this. It is not known if he was on a monitor at that time or not. Workup showed continued chronic hyponatremia, AKI on probable CKD II (Cr 1.87 compared to prior 1.2), leukocytosis WBC 17k, LDL 108. Initial troponin was 1.21 but recheck just 3 hours later was 0.08 with CK 598. UDS, ETOH neg. CT head suspicious for acute/subacute ischemia in the right basal. MRI/MRA head and carotid duplex pending. CXR possible atx. Vitals show he is hypertensive, otherwise not tachycardic or hypoxic.  Currently back from radiology Has no cardiac complaints Not sure why he is in hospital Just remembers Ambulance ride. No chest pain palpitations dyspnea or syncope that patient is aware with no head trauma Or injuries   Past Medical History:  Diagnosis Date  . Arthritis   . BPH (benign  prostatic hyperplasia)   . CKD (chronic kidney disease), stage II   . Complicated UTI (urinary tract infection) Dec.2015  . Depression    hx. of  . Diabetes mellitus without complication (HCC)    diet controlled  . Foley catheter in place 03/20/2014  . GERD (gastroesophageal reflux disease)    occasionally  . Heart murmur    hx. of at 17 yrs. old  . HOH (hard of hearing)   . Hypertension   . Lower GI bleed 2017   a. ? due to polyp.  . Pneumonia    hx. of  . Rheumatic fever    when 4 yrs. old  . Sleep apnea    does not use c-pap machine  . Sodium (Na) deficiency    managed in nursing center12/2015-Feb-2016    Past Surgical History:  Procedure Laterality Date  . CARPAL TUNNEL RELEASE Left 2010  . CARPAL TUNNEL RELEASE Right 07/13/2017   Procedure: CARPAL TUNNEL RELEASE;  Surgeon: Carole Civil, MD;  Location: [facility];  Service: Orthopedics;  Laterality: Right;  . CERVICAL SPINE SURGERY  2010  . COLONOSCOPY WITH PROPOFOL N/A 11/30/2015   Procedure: COLONOSCOPY WITH PROPOFOL;  Surgeon: Daneil Dolin, MD;  Location: [facility];  Service: Endoscopy;  Laterality: N/A;  . KNEE ARTHROSCOPY  1989  . LUMBAR LAMINECTOMY  1986  . Nose surgery - broken nose    . POLYPECTOMY  11/30/2015   Procedure: POLYPECTOMY;  Surgeon: Daneil Dolin, MD;  Location: [facility];  Service: Endoscopy;;  polyp  at ascending colon, rectal polyp  . TRANSURETHRAL INCISION OF PROSTATE N/A 01/20/2015   Procedure: TRANSURETHRAL INCISION OF THE PROSTATE (TUIP);  Surgeon: Irine Seal, MD;  Location: [facility];  Service: Urology;  Laterality: N/A;     Inpatient Medications: Scheduled Meds: . aspirin  325 mg Oral Daily  . enoxaparin (LOVENOX) injection  40 mg Subcutaneous Q24H  . insulin aspart  0-15 Units Subcutaneous TID WC   Continuous Infusions: . sodium chloride 100 mL/hr at 08/23/17 0232   PRN Meds: acetaminophen **OR** acetaminophen (TYLENOL) oral liquid 160 mg/5 mL **OR** acetaminophen,  ondansetron **OR** ondansetron (ZOFRAN) IV  Home Meds: Prior to Admission medications   Medication Sig Start Date End Date Taking? Authorizing Provider  gabapentin (NEURONTIN) 300 MG capsule Take 300 mg by mouth 3 (three) times daily.    [provider]  hydrochlorothiazide (MICROZIDE) 12.5 MG capsule Take 12.5 mg by mouth daily.    [provider]  telmisartan (MICARDIS) 40 MG tablet Take 40 mg by mouth daily. 08/14/17   [provider]    Allergies:   No Known Allergies  Social History:   Social History   Socioeconomic History  . Marital status: Divorced    Spouse name: Not on file  . Number of children: Not on file  . Years of education: Not on file  . Highest education level: Not on file  Occupational History  . Not on file  Social Needs  . Financial resource strain: Not on file  . Food insecurity:    Worry: Not on file    Inability: Not on file  . Transportation needs:    Medical: Not on file    Non-medical: Not on file  Tobacco Use  . Smoking status: Former Smoker    Packs/day: 1.50    Years: 14.00    Pack years: 21.00    Types: Cigarettes    Last attempt to quit: 08/06/1975    Years since quitting: 42.0  . Smokeless tobacco: Never Used  Substance and Sexual Activity  . Alcohol use: Yes    Alcohol/week: 1.8 oz    Types: 3 Cans of beer per week    Comment: 3 12 oz cans of beer each week.None since 03/2014  . Drug use: No  . Sexual activity: Yes    Birth control/protection: None  Lifestyle  . Physical activity:    Days per week: Not on file    Minutes per session: Not on file  . Stress: Not on file  Relationships  . Social connections:    Talks on phone: Not on file    Gets together: Not on file    Attends religious service: Not on file    Active member of club or organization: Not on file    Attends meetings of clubs or organizations: Not on file    Relationship status: Not on file  . Intimate partner violence:    Fear of  current or ex partner: Not on file    Emotionally abused: Not on file    Physically abused: Not on file    Forced sexual activity: Not on file  Other Topics Concern  . Not on file  Social History Narrative  . Not on file    Family History:   The patient's family history is not on file. Father with HTN mother with DM   ROS:  Please see the history of present illness.  All other ROS reviewed and negative.     Physical Exam/Data:  Vitals:   08/23/17 0400 08/23/17 0500 08/23/17 0600 08/23/17 0941  BP: (!) 177/82 (!) 183/89 (!) 178/75 (!) 154/74  Pulse: 65 62 60   Resp: 16 17 13 14   Temp:      TempSrc:      SpO2: 98% 97% 95%   Weight:      Height:        Intake/Output Summary (Last 24 hours) at 08/23/2017 1023 Last data filed at 08/23/2017 0900 Gross per 24 hour  Intake 2429.87 ml  Output -  Net 2429.87 ml   Filed Weights   08/22/17 2155 08/23/17 0322  Weight: 188 lb (85.3 kg) 183 lb 3.2 oz (83.1 kg)   Body mass index is 25.55 kg/m.  General: Well developed, well nourished, in no acute distress. Head: Normocephalic, atraumatic, sclera non-icteric, no xanthomas, nares are without discharge.  Neck: Negative for carotid bruits. JVD not elevated. Lungs: Clear bilaterally to auscultation without wheezes, rales, or rhonchi. Breathing is unlabored. Heart: RRR with S1 S2. No murmurs, rubs, or gallops appreciated. Abdomen: Soft, non-tender, non-distended with normoactive bowel sounds. No hepatomegaly. No rebound/guarding. No obvious abdominal masses. Msk:  Strength and tone appear normal for age. Extremities: No clubbing or cyanosis. No edema.  Distal pedal pulses are 2+ and equal bilaterally. Neuro: Alert and oriented X 3. No facial asymmetry. No focal deficit. Moves all extremities spontaneously. Psych:  Responds to questions appropriately with a normal affect.  EKG:  The EKG was personally reviewed and demonstrates NSR 67bpm one PVC without acute changes  otherwise  Relevant CV Studies: TTE ordered  Reviewed carotid no ICA stenosis 08/23/17  Laboratory Data:  Chemistry Recent Labs  Lab 08/22/17 2220  NA 131*  K 4.3  CL 99*  CO2 21*  GLUCOSE 146*  BUN 24*  CREATININE 1.87*  CALCIUM 9.1  GFRNONAA 34*  GFRAA 39*  ANIONGAP 11    Recent Labs  Lab 08/22/17 2220  PROT 6.8  ALBUMIN 4.0  AST 25  ALT 13*  ALKPHOS 50  BILITOT 1.2   Hematology Recent Labs  Lab 08/22/17 2220  WBC 17.2*  RBC 4.74  HGB 13.9  HCT 41.2  MCV 86.9  MCH 29.3  MCHC 33.7  RDW 13.4  PLT 287   Cardiac Enzymes Recent Labs  Lab 08/22/17 2220 08/23/17 0113 08/23/17 0920  TROPONINI 1.21* 0.08* 0.07*   No results for input(s): TROPIPOC in the last 168 hours.  BNPNo results for input(s): BNP, PROBNP in the last 168 hours.  DDimer No results for input(s): DDIMER in the last 168 hours.  Radiology/Studies:  Dg Chest 2 View  Result Date: 08/23/2017 CLINICAL DATA:  Altered mental status. EXAM: CHEST - 2 VIEW COMPARISON:  08/22/2017 and 03/24/2014 and CT scan of the abdomen dated 05/31/2009 FINDINGS: The heart size and pulmonary vascularity are normal. Prominent left pericardial fat pad. No infiltrates or effusions. No bone abnormality. IMPRESSION: No active cardiopulmonary disease. Electronically Signed   By: Lorriane Shire M.D.   On: 08/23/2017 08:41   Dg Chest 2 View  Result Date: 08/22/2017 CLINICAL DATA:  Syncope. EXAM: CHEST - 2 VIEW COMPARISON:  03/24/2014 FINDINGS: The cardiomediastinal contours are normal. Minor left lung base atelectasis. Pulmonary vasculature is normal. No consolidation, pleural effusion, or pneumothorax. No acute osseous abnormalities are seen. Cervical spine fusion hardware partially included. IMPRESSION: Minor left lung base atelectasis. Electronically Signed   By: Jeb Levering M.D.   On: 08/22/2017 23:20   Ct Head Wo Contrast  Result Date: 08/22/2017  CLINICAL DATA:  Altered level of consciousness. Found wandering in  someone's yard, syncopal episode upon arrival of EMS. EXAM: CT HEAD WITHOUT CONTRAST TECHNIQUE: Contiguous axial images were obtained from the base of the skull through the vertex without intravenous contrast. COMPARISON:  None. FINDINGS: Brain: Ill-defined 7 x 16 mm low-density in the right basal ganglia suspicious for acute/subacute ischemia. No hemorrhage. Mega cisterna magna is incidentally noted. Normal for age atrophy. No midline shift or mass effect. No subdural or extra-axial collection. Vascular: Atherosclerosis of skullbase vasculature without hyperdense vessel or abnormal calcification. Skull: No fracture or focal lesion. Sinuses/Orbits: Paranasal sinuses and mastoid air cells are clear. The visualized orbits are unremarkable. Other: None. IMPRESSION: Findings suspicious for acute/subacute ischemia in the right basal ganglia. No hemorrhage. Electronically Signed   By: Jeb Levering M.D.   On: 08/22/2017 23:15   Mr Brain Wo Contrast  Result Date: 08/23/2017 CLINICAL DATA:  Altered mental status. Syncope. Right basal ganglia infarct on CT. EXAM: MRI HEAD WITHOUT CONTRAST MRA HEAD WITHOUT CONTRAST TECHNIQUE: Multiplanar, multiecho pulse sequences of the brain and surrounding structures were obtained without intravenous contrast. Angiographic images of the head were obtained using MRA technique without contrast. COMPARISON:  Head CT 08/22/2017 and MRI 04/02/2010 FINDINGS: MRI HEAD FINDINGS Brain: An acute right basal ganglia infarct involves both the caudate and lentiform nuclei and measures 3 x 1.5 cm. No intracranial hemorrhage, mass, midline shift, or extra-axial fluid collection is identified. Mild cerebral atrophy is within normal limits for age. There is a mega cisterna magna, a normal variant. No significant chronic white matter disease is present for age. Vascular: Mildly abnormal appearance of the distal right vertebral artery is unchanged from the prior MRI and more fully evaluated below.  Other major intracranial vascular flow voids are preserved. Skull and upper cervical spine: Unremarkable bone marrow signal. Sinuses/Orbits: Unremarkable orbits. Paranasal sinuses and mastoid air cells are clear. Other: None. MRA HEAD FINDINGS The study is mildly motion degraded. The visualized distal left vertebral artery is widely patent and dominant. There is a segmental region of signal loss involving the distal most right V3 and proximal V4 segments with the vessel appearing patent proximal to this in the included V3 segment as well as patent more distally to the PICA origin. This region of signal loss may be artifactual or reflect a segmental occlusion or reduced flow from an underlying V3-V4 junction stenosis. The right vertebral artery distal to the PICA origin is not visualized and is likely occluded. The basilar artery is patent with minimal narrowing proximally. Posterior communicating arteries are not identified and may be diminutive or absent. The PCAs are patent without evidence of flow limiting proximal stenosis. There is at most mild narrowing versus mild motion artifact involving both P1 segments. There is asymmetric attenuation of the P2 and more distal portions of the right PCA. The internal carotid arteries are patent from skull base to carotid termini with mild-to-moderate paraclinoid stenoses bilaterally. ACAs and MCAs are patent without evidence of proximal branch occlusion. There is the suggestion of mild right and moderate left proximal A1 stenoses and a mild to moderate right mid M1 stenosis, however motion may exaggerate the appearance. No aneurysm is identified. IMPRESSION: 1. Acute right basal ganglia infarct. 2. Occlusion of the non-dominant right vertebral artery distal to PICA. Segmental occlusion versus reduced flow and/or artifact more proximally in the right V4 segment. 3. No major anterior circulation branch occlusion. Mild-to-moderate bilateral ICA, proximal ACA, and right M1  stenoses. Electronically Signed  By: Logan Bores M.D.   On: 08/23/2017 08:32   US Carotid Bilateral (at [facility] Only)  Result Date: 08/23/2017 CLINICAL DATA:  72 year old male with acute right basal ganglia infarct and chronic right intracranial vertebral artery occlusion EXAM: BILATERAL CAROTID DUPLEX ULTRASOUND TECHNIQUE: Pearline Cables scale imaging, color Doppler and duplex ultrasound were performed of bilateral carotid and vertebral arteries in the neck. COMPARISON:  Brain MRI and MRA 08/23/2016 FINDINGS: Criteria: Quantification of carotid stenosis is based on velocity parameters that correlate the residual internal carotid diameter with NASCET-based stenosis levels, using the diameter of the distal internal carotid lumen as the denominator for stenosis measurement. The following velocity measurements were obtained: RIGHT ICA: 67/22 cm/sec CCA: 433/29 cm/sec SYSTOLIC ICA/CCA RATIO:  0.7 ECA:  92 cm/sec LEFT ICA: 80/9 cm/sec CCA: 518/84 cm/sec SYSTOLIC ICA/CCA RATIO:  0.7 ECA:  98 cm/sec RIGHT CAROTID ARTERY: Mild smooth heterogeneous atherosclerotic plaque in the common carotid artery. No evidence of significant atherosclerotic plaque or stenosis in the internal carotid artery. RIGHT VERTEBRAL ARTERY:  Patent with normal antegrade flow. LEFT CAROTID ARTERY: Mild heterogeneous atherosclerotic plaque in the distal common carotid artery extending into the proximal internal carotid artery. By peak systolic velocity criteria, the estimated stenosis remains less than 50%. LEFT VERTEBRAL ARTERY:  Patent with normal antegrade flow. IMPRESSION: 1. Mild (1-49%) stenosis proximal left internal carotid artery secondary to heterogenous atherosclerotic plaque. 2. No significant atherosclerotic plaque or stenosis in the right internal carotid artery. 3. The bilateral vertebral arteries are patent with normal antegrade flow. Signed, Criselda Peaches, MD [facility]  Electronically Signed   By: Jacqulynn Cadet M.D.   On: 08/23/2017 09:05   Mr Jodene Nam Head/brain ZY Cm  Result Date: 08/23/2017 CLINICAL DATA:  Altered mental status. Syncope. Right basal ganglia infarct on CT. EXAM: MRI HEAD WITHOUT CONTRAST MRA HEAD WITHOUT CONTRAST TECHNIQUE: Multiplanar, multiecho pulse sequences of the brain and surrounding structures were obtained without intravenous contrast. Angiographic images of the head were obtained using MRA technique without contrast. COMPARISON:  Head CT 08/22/2017 and MRI 04/02/2010 FINDINGS: MRI HEAD FINDINGS Brain: An acute right basal ganglia infarct involves both the caudate and lentiform nuclei and measures 3 x 1.5 cm. No intracranial hemorrhage, mass, midline shift, or extra-axial fluid collection is identified. Mild cerebral atrophy is within normal limits for age. There is a mega cisterna magna, a normal variant. No significant chronic white matter disease is present for age. Vascular: Mildly abnormal appearance of the distal right vertebral artery is unchanged from the prior MRI and more fully evaluated below. Other major intracranial vascular flow voids are preserved. Skull and upper cervical spine: Unremarkable bone marrow signal. Sinuses/Orbits: Unremarkable orbits. Paranasal sinuses and mastoid air cells are clear. Other: None. MRA HEAD FINDINGS The study is mildly motion degraded. The visualized distal left vertebral artery is widely patent and dominant. There is a segmental region of signal loss involving the distal most right V3 and proximal V4 segments with the vessel appearing patent proximal to this in the included V3 segment as well as patent more distally to the PICA origin. This region of signal loss may be artifactual or reflect a segmental occlusion or reduced flow from an underlying V3-V4 junction stenosis. The right vertebral artery distal to the PICA origin is not visualized and is likely occluded. The basilar artery is patent with minimal  narrowing proximally. Posterior communicating arteries are not identified and may be diminutive or absent. The PCAs are patent  without evidence of flow limiting proximal stenosis. There is at most mild narrowing versus mild motion artifact involving both P1 segments. There is asymmetric attenuation of the P2 and more distal portions of the right PCA. The internal carotid arteries are patent from skull base to carotid termini with mild-to-moderate paraclinoid stenoses bilaterally. ACAs and MCAs are patent without evidence of proximal branch occlusion. There is the suggestion of mild right and moderate left proximal A1 stenoses and a mild to moderate right mid M1 stenosis, however motion may exaggerate the appearance. No aneurysm is identified. IMPRESSION: 1. Acute right basal ganglia infarct. 2. Occlusion of the non-dominant right vertebral artery distal to PICA. Segmental occlusion versus reduced flow and/or artifact more proximally in the right V4 segment. 3. No major anterior circulation branch occlusion. Mild-to-moderate bilateral ICA, proximal ACA, and right M1 stenoses. Electronically Signed   By: Logan Bores M.D.   On: 08/23/2017 08:32    Assessment and Plan:   1. Elevated troponin: no cardiac symptoms repeat only .08 No acute ECG changes doubt significance will order TTE to assess EF and RWMAls 2. HTN:  With azotemia will start norvasc hold home diuretic not clear that he was taking 3. MS:  Still only oriented to place acute thrombotic right basal ganglia infarct not embolic ? ASA/Plavix neurology consult Carotids with no significant ICA stenosis   Tests Ordered TTE   For questions or updates, please contact [facility] Please consult [email or web address] for contact info under Cardiology/STEMI.    Signed, Jenkins Rouge, MD  08/23/2017 10:23 AM

## 2017-08-23 NOTE — Progress Notes (Signed)
Patient admitted to the hospital earlier this morning by Dr. Olevia Bowens  Patient seen and examined.  He is feeling better today.  He knows he is in the hospital, confusion appears to be improving.  Strength is equal bilaterally.  Patient was admitted to the hospital with altered mental status, confusion.  He was noted to be dehydrated.  MR imaging does confirm stroke.  He is undergoing further stroke work-up.  Anticipate he should be in the hospital another 24 hours.  [employer]

## 2017-08-23 NOTE — Evaluation (Signed)
Physical Therapy Evaluation Patient Details Name: Albert Garrison MRN: [id number] DOB: [birth year] Today's Date: 08/23/2017   History of Present Illness  Albert Garrison is a 72 y.o. male with medical history significant of osteoarthritis, history of complicated UTI, history of depression, diet-controlled type 2 diabetes, GERD, hypertension, history of pneumonia, history of rheumatic fever at age 45, history of sleep apnea but does not use CPAP, hyponatremia who is brought to the emergency department via EMS after 911 was called due to the patient found wandering on someone else's yard. Apparently, the patient went hiking, but then lost the Trail.  He stated that he is sweated a lot.  Then ended up in somebody else's yard, EMS was called and the patient subsequently had a syncopal episode.  He is unable to tell much history, but denies headache, chest pain, abdominal pain or any other type of pain when asked now.  However, obtaining history from vision remains difficult, as he continues to be confused. MRI positive for acute R CVA    Clinical Impression  Patient functioning near baseline for functional mobility and gait other demonstrating slow labored cadence without loss of balance or having to lean on nearby objects.  Patient tolerated sitting up in chair with family members present.  Patient will benefit from continued physical therapy in hospital and recommended venue below to increase strength, balance, endurance for safe ADLs and gait.     Follow Up Recommendations Home health PT    Equipment Recommendations  None recommended by PT    Recommendations for Other Services       Precautions / Restrictions Precautions Precautions: None Restrictions Weight Bearing Restrictions: No      Mobility  Bed Mobility Overal bed mobility: Modified Independent                Transfers Overall transfer level: Needs assistance Equipment used: None Transfers: Sit to/from Stand;Stand Pivot  Transfers Sit to Stand: Supervision Stand pivot transfers: Supervision       General transfer comment: slow slightly unsteady movement  Ambulation/Gait Ambulation/Gait assistance: Supervision Ambulation Distance (Feet): 100 Feet Assistive device: None Gait Pattern/deviations: Decreased step length - right;Decreased step length - left;Decreased stride length Gait velocity: slow   General Gait Details: slightly unsteady slow cadence without loss of balance, no leaning on near by objects  Stairs            Wheelchair Mobility    Modified Rankin (Stroke Patients Only)       Balance Overall balance assessment: No apparent balance deficits (not formally assessed)                                           Pertinent Vitals/Pain Pain Assessment: No/denies pain    Home Living Family/patient expects to be discharged to:: Private residence Living Arrangements: Alone Available Help at Discharge: Other (Comment)(patient states he does not have help?) Type of Home: Apartment Home Access: Stairs to enter Entrance Stairs-Rails: None Entrance Stairs-Number of Steps: 2 Home Layout: Other (Comment)(lives on ground level of an apartment complex) Home Equipment: None      Prior Function Level of Independence: Independent               Hand Dominance   Dominant Hand: Left    Extremity/Trunk Assessment   Upper Extremity Assessment Upper Extremity Assessment: Defer to OT evaluation    Lower Extremity  Assessment Lower Extremity Assessment: Overall WFL for tasks assessed    Cervical / Trunk Assessment Cervical / Trunk Assessment: Normal  Communication   Communication: No difficulties  Cognition Arousal/Alertness: Awake/alert Behavior During Therapy: WFL for tasks assessed/performed Overall Cognitive Status: Within Functional Limits for tasks assessed                                        General Comments      Exercises      Assessment/Plan    PT Assessment Patient needs continued PT services  PT Problem List Decreased activity tolerance;Decreased balance;Decreased mobility       PT Treatment Interventions Gait training;Stair training;Functional mobility training;Therapeutic activities;Therapeutic exercise;Patient/family education    PT Goals (Current goals can be found in the Care Plan section)  Acute Rehab PT Goals Patient Stated Goal: return home PT Goal Formulation: With patient Time For Goal Achievement: 08/26/17 Potential to Achieve Goals: Good    Frequency 7X/week   Barriers to discharge        Co-evaluation               AM-PAC PT "6 Clicks" Daily Activity  Outcome Measure Difficulty turning over in bed (including adjusting bedclothes, sheets and blankets)?: None Difficulty moving from lying on back to sitting on the side of the bed? : None Difficulty sitting down on and standing up from a chair with arms (e.g., wheelchair, bedside commode, etc,.)?: None Help needed moving to and from a bed to chair (including a wheelchair)?: None Help needed walking in hospital room?: A Little Help needed climbing 3-5 steps with a railing? : A Little 6 Click Score: 22    End of Session Equipment Utilized During Treatment: Gait belt Activity Tolerance: Patient tolerated treatment well;Patient limited by fatigue Patient left: in chair;with call bell/phone within reach;with family/visitor present Nurse Communication: Mobility status PT Visit Diagnosis: Unsteadiness on feet (R26.81);Other abnormalities of gait and mobility (R26.89);Muscle weakness (generalized) (M62.81)    Time: 7741-2878 PT Time Calculation (min) (ACUTE ONLY): 23 min   Charges:   PT Evaluation $PT Eval Moderate Complexity: 1 Mod PT Treatments $Therapeutic Activity: 23-37 mins   PT G Codes:        2:59 PM, 2017/08/25 Lonell Grandchild, MPT Physical Therapist with [facility] [phone] office [phone]  mobile phone

## 2017-08-23 NOTE — Evaluation (Addendum)
Occupational Therapy Evaluation Patient Details Name: Albert Garrison MRN: [id number] DOB: 1945-10-07 Today's Date: 08/23/2017    History of Present Illness Albert Garrison is a 72 y.o. male with medical history significant of osteoarthritis, history of complicated UTI, history of depression, diet-controlled type 2 diabetes, GERD, hypertension, history of pneumonia, history of rheumatic fever at age 55, history of sleep apnea but does not use CPAP, hyponatremia who is brought to the emergency department via EMS after 911 was called due to the patient found wandering on someone else's yard. Apparently, the patient went hiking, but then lost the Trail.  He stated that he is sweated a lot.  Then ended up in somebody else's yard, EMS was called and the patient subsequently had a syncopal episode.  He is unable to tell much history, but denies headache, chest pain, abdominal pain or any other type of pain when asked now.  However, obtaining history from vision remains difficult, as he continues to be confused. MRI positive for R CVA.    Clinical Impression   Pt received supine in bed, PT in room, agreeable to evaluation. Pt demonstrates BUE strength WNL, coordination and sensation are intact. Pt had recent right carpal tunnel sx, right grip slightly decreased. Pt performing ADLs at supervision/mod I level this am. No further OT services are required at this time. Recommend intermittent supervision on discharge home for pt safety.     Follow Up Recommendations  No OT follow up;Supervision - Intermittent    Equipment Recommendations  None recommended by OT       Precautions / Restrictions Precautions Precautions: None Restrictions Weight Bearing Restrictions: No      Mobility Bed Mobility Overal bed mobility: Modified Independent                Transfers                 General transfer comment: see PT note        ADL either performed or assessed with clinical judgement    ADL Overall ADL's : Modified independent                 Upper Body Dressing : Modified independent   Lower Body Dressing: Modified independent;Sitting/lateral leans               Functional mobility during ADLs: Supervision/safety General ADL Comments: Pt performing ADLs with mod independence/supervision     Vision Baseline Vision/History: No visual deficits Patient Visual Report: No change from baseline Vision Assessment?: No apparent visual deficits            Pertinent Vitals/Pain Pain Assessment: No/denies pain     Hand Dominance Left   Extremity/Trunk Assessment Upper Extremity Assessment Upper Extremity Assessment: Overall WFL for tasks assessed(strength grossly 4+/5, R grip decreased d/t recent sx)   Lower Extremity Assessment Lower Extremity Assessment: Defer to PT evaluation   Cervical / Trunk Assessment Cervical / Trunk Assessment: Normal   Communication Communication Communication: No difficulties   Cognition Arousal/Alertness: Awake/alert Behavior During Therapy: WFL for tasks assessed/performed Overall Cognitive Status: Within Functional Limits for tasks assessed                                                Home Living Family/patient expects to be discharged to:: Private residence Living Arrangements: Alone Available Help at Discharge: Other (  Comment)(per pt nobody to help, but has friends) Type of Home: House Home Access: Level entry     Home Layout: One level     Bathroom Shower/Tub: Teacher, early years/pre: Standard     Home Equipment: None          Prior Functioning/Environment Level of Independence: Independent                 OT Problem List: Decreased activity tolerance                       Co-evaluation PT/OT/SLP Co-Evaluation/Treatment: Yes Reason for Co-Treatment: Complexity of the patient's impairments (multi-system involvement)   OT goals addressed during  session: ADL's and self-care      AM-PAC PT "6 Clicks" Daily Activity     Outcome Measure Help from another person eating meals?: None Help from another person taking care of personal grooming?: None Help from another person toileting, which includes using toliet, bedpan, or urinal?: None Help from another person bathing (including washing, rinsing, drying)?: None Help from another person to put on and taking off regular upper body clothing?: None Help from another person to put on and taking off regular lower body clothing?: None 6 Click Score: 24   End of Session Equipment Utilized During Treatment: Gait belt  Activity Tolerance: Patient tolerated treatment well Patient left: in chair;with call bell/phone within reach;with family/visitor present  OT Visit Diagnosis: Muscle weakness (generalized) (M62.81)                Time: 1610-9604 OT Time Calculation (min): 18 min Charges:  OT General Charges $OT Visit: 1 Visit OT Evaluation $OT Eval Low Complexity: 1 Low    Albert Garrison, OTR/L  [phone] 08/23/2017, 9:01 AM

## 2017-08-23 NOTE — Consult Note (Signed)
[place] A. Merlene Laughter, MD     [email or web address]          Albert Garrison is an 72 y.o. male.   ASSESSMENT/PLAN: 1. ALTERED MENTAL STATUS/ACUTE ENCEPHALOPATHY OF UNCLEAR ETIOLOGY:  POTENTIAL ETIOLOGIES INCLUDES ACUTE ISCHEMIC STROKES AND SEIZURES AS SO FAR IN TOXIC METABOLIC PROCESSES HAVE NOT BEEN UNCOVERED.  We will follow of the brain MRI.  EEG will be obtained.  Repeat neurological examination is suggested.  Consider additional labs for causes of dementia.  The patient is 52 year old man who apparently was visiting family and friends.  He went to hanging a rock and the apparently climbed up the mountains and was descending when he became disoriented and confused.  The patient seemed not to be able to provide a precise history and in fact his history is unreliable as far as what happened.  He may have stumbled and fall.  There is some evidence that he may have lost consciousness but the patient denies is.  No clear tonic-clonic activity is reported.  The patient denies oral trauma or urinary incontinence.  Somehow the patient was taking from all that way to this hospital.  He cannot provide a history as to what happened.  He denies focal numbness, weakness, headaches, dysarthria, dysphagia, chest pain or shortness of breath.  The review of systems otherwise negative.  GENERAL:  He is in no acute distress.  HEENT:  Neck is supple and no evidence of trauma.  No tongue biting or lip biting is observed.  No oral trauma.  ABDOMEN: Soft  EXTREMITIES: No edema   BACK: Normal alignment.  SKIN: Normal by inspection.    MENTAL STATUS: Alert and oriented x3 although repeated prompting had to be done.  Speech s mildly dysarthric.  The family tells me this is his baseline.  The patient had to be tangential a lot and had difficulty focusing and paying attention.  Naming appears to be good.    CRANIAL NERVES: Pupils are equal, round and reactive to light and accommodation;  extraocular movements are full, there is no significant nystagmus; upper and lower facial muscles are normal in strength and symmetric, there is no flattening of the nasolabial folds; tongue is midline; uvula is midline; shoulder elevation is normal.  MOTOR: Normal tone, bulk and strength; no pronator drift.  COORDINATION: Left finger to nose is normal, right finger to nose is normal, No rest tremor; no intention tremor; no postural tremor; no bradykinesia.  REFLEXES: Deep tendon reflexes are symmetrical and normal. Plantar responses are flexor bilaterally.   SENSATION: Normal to light touch and temperature.          Blood pressure (!) 176/89, pulse 65, temperature 98.3 F (36.8 C), temperature source Oral, resp. rate 18, height '5\' 11"'$  (1.803 m), weight 183 lb 3.2 oz (83.1 kg), SpO2 97 %.  Past Medical History:  Diagnosis Date  . Arthritis   . BPH (benign prostatic hyperplasia)   . CKD (chronic kidney disease), stage II   . Complicated UTI (urinary tract infection) Dec.2015  . Depression    hx. of  . Diabetes mellitus without complication (HCC)    diet controlled  . Foley catheter in place 03/20/2014  . GERD (gastroesophageal reflux disease)    occasionally  . Heart murmur    hx. of at 17 yrs. old  . HOH (hard of hearing)   . Hypertension   . Lower GI bleed 2017   a. ? due to polyp.  . Pneumonia  hx. of  . Rheumatic fever    when 4 yrs. old  . Sleep apnea    does not use c-pap machine  . Sodium (Na) deficiency    managed in nursing center12/2015-Feb-2016    Past Surgical History:  Procedure Laterality Date  . CARPAL TUNNEL RELEASE Left 2010  . CARPAL TUNNEL RELEASE Right 07/13/2017   Procedure: CARPAL TUNNEL RELEASE;  Surgeon: Carole Civil, MD;  Location: [facility];  Service: Orthopedics;  Laterality: Right;  . CERVICAL SPINE SURGERY  2010  . COLONOSCOPY WITH PROPOFOL N/A 11/30/2015   Procedure: COLONOSCOPY WITH PROPOFOL;  Surgeon: Daneil Dolin, MD;   Location: [facility];  Service: Endoscopy;  Laterality: N/A;  . KNEE ARTHROSCOPY  1989  . LUMBAR LAMINECTOMY  1986  . Nose surgery - broken nose    . POLYPECTOMY  11/30/2015   Procedure: POLYPECTOMY;  Surgeon: Daneil Dolin, MD;  Location: [facility];  Service: Endoscopy;;  polyp at ascending colon, rectal polyp  . TRANSURETHRAL INCISION OF PROSTATE N/A 01/20/2015   Procedure: TRANSURETHRAL INCISION OF THE PROSTATE (TUIP);  Surgeon: Irine Seal, MD;  Location: [facility];  Service: Urology;  Laterality: N/A;    History reviewed. No pertinent family history.  Social History:  reports that he quit smoking about 42 years ago. His smoking use included cigarettes. He has a 21.00 pack-year smoking history. He has never used smokeless tobacco. He reports that he drinks about 1.8 oz of alcohol per week. He reports that he does not use drugs.  Allergies: No Known Allergies  Medications: Prior to Admission medications   Medication Sig Start Date End Date Taking? Authorizing Provider  gabapentin (NEURONTIN) 300 MG capsule Take 300 mg by mouth 3 (three) times daily.   Yes [provider]  hydrochlorothiazide (MICROZIDE) 12.5 MG capsule Take 12.5 mg by mouth daily.   Yes [provider]    Scheduled Meds: . amLODipine  10 mg Oral Daily  . aspirin  325 mg Oral Daily  . enoxaparin (LOVENOX) injection  40 mg Subcutaneous Q24H  . insulin aspart  0-15 Units Subcutaneous TID WC   Continuous Infusions: . sodium chloride 100 mL/hr at 08/23/17 1426   PRN Meds:.acetaminophen **OR** acetaminophen (TYLENOL) oral liquid 160 mg/5 mL **OR** acetaminophen, ondansetron **OR** ondansetron (ZOFRAN) IV     Results for orders placed or performed during the hospital encounter of 08/22/17 (from the past 48 hour(s))  Comprehensive metabolic panel     Status: Abnormal   Collection Time: 08/22/17 10:20 PM  Result Value Ref Range   Sodium 131 (L) 135 - 145 mmol/L   Potassium 4.3 3.5 - 5.1 mmol/L    Chloride 99 (L) 101 - 111 mmol/L   CO2 21 (L) 22 - 32 mmol/L   Glucose, Bld 146 (H) 65 - 99 mg/dL   BUN 24 (H) 6 - 20 mg/dL   Creatinine, Ser 1.87 (H) 0.61 - 1.24 mg/dL   Calcium 9.1 8.9 - 10.3 mg/dL   Total Protein 6.8 6.5 - 8.1 g/dL   Albumin 4.0 3.5 - 5.0 g/dL   AST 25 15 - 41 U/L   ALT 13 (L) 17 - 63 U/L   Alkaline Phosphatase 50 38 - 126 U/L   Total Bilirubin 1.2 0.3 - 1.2 mg/dL   GFR calc non Af Amer 34 (L) >60 mL/min   GFR calc Af Amer 39 (L) >60 mL/min    Comment: (NOTE) The eGFR has been calculated using the CKD EPI equation.  This calculation has not been validated in all clinical situations. eGFR's persistently <60 mL/min signify possible Chronic Kidney Disease.    Anion gap 11 5 - 15    Comment: Performed at [facility], [address] [place], [place] [address]  Ethanol     Status: None   Collection Time: 08/22/17 10:20 PM  Result Value Ref Range   Alcohol, Ethyl (B) <10 <10 mg/dL    Comment: Performed at [facility], [address] [place], [place] [address]  Urine rapid drug screen (hosp performed)     Status: None   Collection Time: 08/22/17 10:20 PM  Result Value Ref Range   Opiates NONE DETECTED NONE DETECTED   Cocaine NONE DETECTED NONE DETECTED   Benzodiazepines NONE DETECTED NONE DETECTED   Amphetamines NONE DETECTED NONE DETECTED   Tetrahydrocannabinol NONE DETECTED NONE DETECTED   Barbiturates NONE DETECTED NONE DETECTED    Comment: (NOTE) DRUG SCREEN FOR MEDICAL PURPOSES ONLY.  IF CONFIRMATION IS NEEDED FOR ANY PURPOSE, NOTIFY LAB WITHIN 5 DAYS. LOWEST DETECTABLE LIMITS FOR URINE DRUG SCREEN Drug Class                     Cutoff (ng/mL) Amphetamine and metabolites    1000 Barbiturate and metabolites    200 Benzodiazepine                 725 Tricyclics and metabolites     300 Opiates and metabolites        300 Cocaine and metabolites        300 THC                            50 Performed at [facility], [address] [place], [place]  [address]   Urinalysis, Routine w reflex microscopic     Status: Abnormal   Collection Time: 08/22/17 10:20 PM  Result Value Ref Range   Color, Urine YELLOW YELLOW   APPearance CLEAR CLEAR   Specific Gravity, Urine 1.006 1.005 - 1.030   pH 6.0 5.0 - 8.0   Glucose, UA NEGATIVE NEGATIVE mg/dL   Hgb urine dipstick SMALL (A) NEGATIVE   Bilirubin Urine NEGATIVE NEGATIVE   Ketones, ur 5 (A) NEGATIVE mg/dL   Protein, ur NEGATIVE NEGATIVE mg/dL   Nitrite NEGATIVE NEGATIVE   Leukocytes, UA NEGATIVE NEGATIVE   RBC / HPF 0-5 0 - 5 RBC/hpf   WBC, UA 0-5 0 - 5 WBC/hpf   Bacteria, UA NONE SEEN NONE SEEN    Comment: Performed at [facility], [address] [place], [place] [address]  CBC with Differential     Status: Abnormal   Collection Time: 08/22/17 10:20 PM  Result Value Ref Range   WBC 17.2 (H) 4.0 - 10.5 K/uL   RBC 4.74 4.22 - 5.81 MIL/uL   Hemoglobin 13.9 13.0 - 17.0 g/dL   HCT 41.2 39.0 - 52.0 %   MCV 86.9 78.0 - 100.0 fL   MCH 29.3 26.0 - 34.0 pg   MCHC 33.7 30.0 - 36.0 g/dL   RDW 13.4 11.5 - 15.5 %   Platelets 287 150 - 400 K/uL   Neutrophils Relative % 90 %   Neutro Abs 15.4 (H) 1.7 - 7.7 K/uL   Lymphocytes Relative 4 %   Lymphs Abs 0.8 0.7 - 4.0 K/uL   Monocytes Relative 6 %   Monocytes Absolute 1.0 0.1 - 1.0 K/uL   Eosinophils Relative 0 %  Eosinophils Absolute 0.0 0.0 - 0.7 K/uL   Basophils Relative 0 %   Basophils Absolute 0.0 0.0 - 0.1 K/uL    Comment: Performed at [facility], [address], [place], [place] [address]  Troponin I     Status: Abnormal   Collection Time: 08/22/17 10:20 PM  Result Value Ref Range   Troponin I 1.21 (HH) <0.03 ng/mL    Comment: CRITICAL RESULT CALLED TO, READ BACK BY AND VERIFIED WITH: DOSS,M '@2337'$  ON 5.21.19 BY BOWMAN,L Performed at [facility], [address] [place], [place] [address]   Hemoglobin A1c     Status: Abnormal   Collection Time: 08/22/17 10:20 PM  Result Value Ref Range   Hgb A1c MFr Bld 7.1 (H) 4.8 - 5.6 %     Comment: (NOTE) Pre diabetes:          5.7%-6.4% Diabetes:              >6.4% Glycemic control for   <7.0% adults with diabetes    Mean Plasma Glucose 157.07 mg/dL    Comment: Performed at [facility] [address], [place], [place] [address]  Na and K (sodium & potassium), rand urine     Status: None   Collection Time: 08/22/17 11:55 PM  Result Value Ref Range   Sodium, Ur 37 mmol/L   Potassium Urine 22 mmol/L    Comment: Performed at [facility], [address] [place], [place] [address]  Troponin I     Status: Abnormal   Collection Time: 08/23/17  1:13 AM  Result Value Ref Range   Troponin I 0.08 (HH) <0.03 ng/mL    Comment: CRITICAL VALUE NOTED.  VALUE IS CONSISTENT WITH PREVIOUSLY REPORTED AND CALLED VALUE. Performed at [facility], [address], [place], [place] [address]   CK     Status: Abnormal   Collection Time: 08/23/17  1:13 AM  Result Value Ref Range   Total CK 598 (H) 49 - 397 U/L    Comment: Performed at [facility], [address] [place], [place] [address]  Magnesium     Status: None   Collection Time: 08/23/17  1:13 AM  Result Value Ref Range   Magnesium 2.2 1.7 - 2.4 mg/dL    Comment: Performed at [facility], [address] [place] [address] [place], [place] [address]  Phosphorus     Status: None   Collection Time: 08/23/17  1:13 AM  Result Value Ref Range   Phosphorus 3.9 2.5 - 4.6 mg/dL    Comment: Performed at [facility], [address], [place], [place] [address]  Lipid panel     Status: Abnormal   Collection Time: 08/23/17  1:13 AM  Result Value Ref Range   Cholesterol 169 0 - 200 mg/dL   Triglycerides 118 <150 mg/dL   HDL 37 (L) >40 mg/dL   Total CHOL/HDL Ratio 4.6 RATIO   VLDL 24 0 - 40 mg/dL   LDL Cholesterol 108 (H) 0 - 99 mg/dL    Comment:        Total Cholesterol/HDL:CHD Risk Coronary Heart Disease Risk Table                     Men   Women  1/2 Average Risk   3.4   3.3  Average Risk       5.0   4.4  2 X Average Risk   9.6   7.1  3 X  Average Risk  23.4  11.0        Use the calculated Patient Ratio above and the CHD Risk Table to determine the patient's CHD Risk.        ATP III CLASSIFICATION (LDL):  <100     mg/dL   Optimal  100-129  mg/dL   Near or Above                    Optimal  130-159  mg/dL   Borderline  160-189  mg/dL   High  >190     mg/dL   Very High Performed at [facility], [address] [place], [place] [address]   MRSA PCR Screening     Status: None   Collection Time: 08/23/17  3:15 AM  Result Value Ref Range   MRSA by PCR NEGATIVE NEGATIVE    Comment:        The GeneXpert MRSA Assay (FDA approved for NASAL specimens only), is one component of a comprehensive MRSA colonization surveillance program. It is not intended to diagnose MRSA infection nor to guide or monitor treatment for MRSA infections. Performed at [facility], [address] [place], [place] [address]   Glucose, capillary     Status: Abnormal   Collection Time: 08/23/17  8:36 AM  Result Value Ref Range   Glucose-Capillary 115 (H) 65 - 99 mg/dL  Troponin I     Status: Abnormal   Collection Time: 08/23/17  9:20 AM  Result Value Ref Range   Troponin I 0.07 (HH) <0.03 ng/mL    Comment: CRITICAL VALUE NOTED.  VALUE IS CONSISTENT WITH PREVIOUSLY REPORTED AND CALLED VALUE. Performed at [facility], [address] [place], [place] [address]   Glucose, capillary     Status: Abnormal   Collection Time: 08/23/17 11:39 AM  Result Value Ref Range   Glucose-Capillary 164 (H) 65 - 99 mg/dL    Studies/Results:     Cashae Weich A. Merlene Laughter, M.D.  Diplomate, Tax adviser of Psychiatry and Neurology ( Neurology). 08/23/2017, 2:56 PM

## 2017-08-23 NOTE — Care Management Note (Signed)
Case Management Note  Patient Details  Name: Albert Garrison MRN: [id number] Date of Birth: 1945/10/01  Subjective/Objective:  Adm with AMS. MRI + for stroke. From home alone. Ind with ADL's pta. Has PCP, still drives. Has insurance with prescription coverage. Recommended for [facility] PT. Patient agreeable. Offered choice.              Action/Plan: Juliann Pulse of [facility] notified and will obtain orders when available. SLP eval pending.   Expected Discharge Date:    08/24/2017              Expected Discharge Plan:  [place]  In-House Referral:     Discharge planning Services  CM Consult  Post Acute Care Choice:  Home Health Choice offered to:  Patient  DME Arranged:    DME Agency:     HH Arranged:  PT [place]:  [place]  Status of Service:  In process, will continue to follow  If discussed at Long Length of Stay Meetings, dates discussed:    Additional Comments:  Merrisa Skorupski, [name], RN 08/23/2017, 2:17 PM

## 2017-08-23 NOTE — Care Management Obs Status (Signed)
[place] NOTIFICATION   Patient Details  Name: KEYSEAN SAVINO MRN: [id number] Date of Birth: 01-Aug-1945   Medicare Observation Status Notification Given:  Yes    Jernard Reiber, [name], RN 08/23/2017, 1:52 PM

## 2017-08-23 NOTE — ED Notes (Signed)
Per pt request to contact family, call placed to ph # in record for Conard Novak and was told I have the wrong number, attempted to call to Abundio Miu, aunt this number rings once and then has a busy signal, per pt other contact [name] is currently in [place], informed pt of attempts

## 2017-08-23 NOTE — Progress Notes (Signed)
RN called and spoke with Albert Garrison pts aunt- Pt gave ok last night when he was alert to give family information. Albert Garrison stated she is going to come up here.  Pt more confused this morning- can't answer his name or DOB. Day shift nurse made aware of changes

## 2017-08-23 NOTE — H&P (Signed)
History and Physical    Albert Garrison:[id number] DOB: 01-30-1946 DOA: 08/22/2017  PCP: Celene Squibb, MD   Patient coming from: Home.  I have personally briefly reviewed patient's old medical records in [place]  Chief Complaint: AMS.  HPI: Albert Garrison is a 72 y.o. male with medical history significant of osteoarthritis, history of complicated UTI, history of depression, diet-controlled type 2 diabetes, GERD, hypertension, history of pneumonia, history of rheumatic fever at age 17, history of sleep apnea but does not use CPAP, hyponatremia who is brought to the emergency department via EMS after 911 was called due to the patient found wandering on someone else's yard.  Apparently, the patient went hiking, but then lost the Trail.  He stated that he is sweated a lot.  Then ended up in somebody else's yard, EMS was called and the patient subsequently had a syncopal episode.  He is unable to tell much history, but denies headache, chest pain, abdominal pain or any other type of pain when asked now.  However, obtaining history from vision remains difficult, as he continues to be confused.  ED Course: Initial vital signs temperature 97.4 F, pulse 69, respirations 16, blood pressure 134/71 mmHg and O2 sat 98% on room air.  Work-up shows a white count of 17.2 with 90% neutrophils, 4% lymphocytes and 6% monocytes.  Hemoglobin 13.9 g/dL and platelets 287.  Sodium was 131, potassium 4.3, chloride 99 and CO2 21 mmol/L.  Glucose was 146, BUN was 24 creatinine 1.87 mg/dL.  LFTs are unremarkable.  Troponin level #1 was 1.21 and #2 was 0.08 ng/mL.  Total CK was 598.  Imaging: A two view chest radiograph showed left lower lobe atelectasis.  CT brain without contrast has findings suspicious for a lacunar infarct at the basal ganglia.  Please see images and full radiology report for further detail.  Review of Systems: Unable to obtain.  Past Medical History:  Diagnosis Date  . Arthritis   .  Complicated UTI (urinary tract infection) Dec.2015  . Depression    hx. of  . Diabetes mellitus without complication (HCC)    diet controlled  . Foley catheter in place 03/20/2014  . GERD (gastroesophageal reflux disease)    occasionally  . Heart murmur    hx. of at 17 yrs. old  . HOH (hard of hearing)   . Hypertension   . Pneumonia    hx. of  . Rheumatic fever    when 4 yrs. old  . Sleep apnea    does not use c-pap machine  . Sodium (Na) deficiency    managed in nursing center12/2015-Feb-2016    Past Surgical History:  Procedure Laterality Date  . CARPAL TUNNEL RELEASE Left 2010  . CARPAL TUNNEL RELEASE Right 07/13/2017   Procedure: CARPAL TUNNEL RELEASE;  Surgeon: Carole Civil, MD;  Location: [facility] ORS;  Service: Orthopedics;  Laterality: Right;  . CERVICAL SPINE SURGERY  2010  . COLONOSCOPY WITH PROPOFOL N/A 11/30/2015   Procedure: COLONOSCOPY WITH PROPOFOL;  Surgeon: Daneil Dolin, MD;  Location: [facility] ENDO SUITE;  Service: Endoscopy;  Laterality: N/A;  . KNEE ARTHROSCOPY  1989  . LUMBAR LAMINECTOMY  1986  . Nose surgery - broken nose    . POLYPECTOMY  11/30/2015   Procedure: POLYPECTOMY;  Surgeon: Daneil Dolin, MD;  Location: [facility] END[facility] SUITE;  Service: Endoscopy;;  polyp at ascending colon, rectal polyp  . TRANSURETHRAL INCISION OF PROSTATE N/A 01/20/2015   Procedure: TRANSURETHRAL INCISION OF  THE PROSTATE (TUIP);  Surgeon: Irine Seal, MD;  Location: [facility] ORS;  Service: Urology;  Laterality: N/A;     reports that he quit smoking about 42 years ago. His smoking use included cigarettes. He has a 21.00 pack-year smoking history. He has never used smokeless tobacco. He reports that he drinks about 1.8 oz of alcohol per week. He reports that he does not use drugs.  No Known Allergies  Family History.  Unable to Obtain due to the patient's confusion.  Prior to Admission medications   Medication Sig Start Date End Date Taking? Authorizing Provider  gabapentin (NEURONTIN)  300 MG capsule Take 300 mg by mouth 3 (three) times daily.    [provider]  hydrochlorothiazide (MICROZIDE) 12.5 MG capsule Take 12.5 mg by mouth daily.    [provider]  telmisartan (MICARDIS) 40 MG tablet Take 40 mg by mouth daily. 08/14/17   [provider]    Physical Exam: Vitals:   08/23/17 0100 08/23/17 0130 08/23/17 0200 08/23/17 0230  BP: (!) 143/98 (!) 158/87 (!) 156/94 (!) 155/94  Pulse: 72 69 73 70  Resp: 15 16 17 15   Temp:      TempSrc:      SpO2: 96% 98% 95% 97%  Weight:      Height:        Constitutional: NAD, calm, comfortable Eyes: PERRL, lids and conjunctivae normal ENMT: Mucous membranes and lips are mildly dry. Posterior pharynx clear of any exudate or lesions. Neck: normal, supple, no masses, no thyromegaly Respiratory: clear to auscultation bilaterally, no wheezing, no crackles. Normal respiratory effort. No accessory muscle use.  Cardiovascular: Regular rate and rhythm, 1/6 systolic murmur, no rubs / gallops. No extremity edema. 2+ pedal pulses. No carotid bruits.  Abdomen: Soft, no tenderness, no masses palpated. No hepatosplenomegaly. Bowel sounds positive.  Musculoskeletal: no clubbing / cyanosis. No gross joint deformity upper and lower extremities. Good ROM, no contractures. Normal muscle tone.  Skin: no rashes, lesions, ulcers. No induration Neurologic: CN 2-12 grossly intact. Sensation intact, DTR normal. Strength 5/5 in all 4.  Psychiatric: Alert and oriented x 2, disoriented to time, date and situation.   Labs on Admission: I have personally reviewed following labs and imaging studies  CBC: Recent Labs  Lab 08/22/17 2220  WBC 17.2*  NEUTROABS 15.4*  HGB 13.9  HCT 41.2  MCV 86.9  PLT 536   Basic Metabolic Panel: Recent Labs  Lab 08/22/17 2220  NA 131*  K 4.3  CL 99*  CO2 21*  GLUCOSE 146*  BUN 24*  CREATININE 1.87*  CALCIUM 9.1   GFR: Estimated Creatinine Clearance: 38.1 mL/min (A) (by C-G formula  based on SCr of 1.87 mg/dL (H)). Liver Function Tests: Recent Labs  Lab 08/22/17 2220  AST 25  ALT 13*  ALKPHOS 50  BILITOT 1.2  PROT 6.8  ALBUMIN 4.0   No results for input(s): LIPASE, AMYLASE in the last 168 hours. No results for input(s): AMMONIA in the last 168 hours. Coagulation Profile: No results for input(s): INR, PROTIME in the last 168 hours. Cardiac Enzymes: Recent Labs  Lab 08/22/17 2220 08/23/17 0113  CKTOTAL  --  598*  TROPONINI 1.21* 0.08*   BNP (last 3 results) No results for input(s): PROBNP in the last 8760 hours. HbA1C: No results for input(s): HGBA1C in the last 72 hours. CBG: No results for input(s): GLUCAP in the last 168 hours. Lipid Profile: No results for input(s): CHOL, HDL, LDLCALC, TRIG, CHOLHDL, LDLDIRECT in the  last 72 hours. Thyroid Function Tests: No results for input(s): TSH, T4TOTAL, FREET4, T3FREE, THYROIDAB in the last 72 hours. Anemia Panel: No results for input(s): VITAMINB12, FOLATE, FERRITIN, TIBC, IRON, RETICCTPCT in the last 72 hours. Urine analysis:    Component Value Date/Time   COLORURINE YELLOW 08/22/2017 [place] 08/22/2017 2220   LABSPEC 1.006 08/22/2017 2220   PHURINE 6.0 08/22/2017 2220   GLUCOSEU NEGATIVE 08/22/2017 2220   HGBUR SMALL (A) 08/22/2017 2220   BILIRUBINUR NEGATIVE 08/22/2017 2220   KETONESUR 5 (A) 08/22/2017 2220   PROTEINUR NEGATIVE 08/22/2017 2220   UROBILINOGEN 0.2 03/24/2014 1320   NITRITE NEGATIVE 08/22/2017 2220   LEUKOCYTESUR NEGATIVE 08/22/2017 2220    Radiological Exams on Admission: Dg Chest 2 View  Result Date: 08/22/2017 CLINICAL DATA:  Syncope. EXAM: CHEST - 2 VIEW COMPARISON:  03/24/2014 FINDINGS: The cardiomediastinal contours are normal. Minor left lung base atelectasis. Pulmonary vasculature is normal. No consolidation, pleural effusion, or pneumothorax. No acute osseous abnormalities are seen. Cervical spine fusion hardware partially included. IMPRESSION: Minor  left lung base atelectasis. Electronically Signed   By: Jeb Levering M.D.   On: 08/22/2017 23:20   Ct Head Wo Contrast  Result Date: 08/22/2017 CLINICAL DATA:  Altered level of consciousness. Found wandering in someone's yard, syncopal episode upon arrival of EMS. EXAM: CT HEAD WITHOUT CONTRAST TECHNIQUE: Contiguous axial images were obtained from the base of the skull through the vertex without intravenous contrast. COMPARISON:  None. FINDINGS: Brain: Ill-defined 7 x 16 mm low-density in the right basal ganglia suspicious for acute/subacute ischemia. No hemorrhage. Mega cisterna magna is incidentally noted. Normal for age atrophy. No midline shift or mass effect. No subdural or extra-axial collection. Vascular: Atherosclerosis of skullbase vasculature without hyperdense vessel or abnormal calcification. Skull: No fracture or focal lesion. Sinuses/Orbits: Paranasal sinuses and mastoid air cells are clear. The visualized orbits are unremarkable. Other: None. IMPRESSION: Findings suspicious for acute/subacute ischemia in the right basal ganglia. No hemorrhage. Electronically Signed   By: Jeb Levering M.D.   On: 08/22/2017 23:15    EKG: Independently reviewed.  Vent. rate 67 BPM PR interval * ms QRS duration 106 ms QT/QTc 461/487 ms P-R-T axes 13 -27 41 Sinus rhythm Ventricular premature complex Borderline left axis deviation Abnormal R-wave progression, early transition Borderline prolonged QT interval  Assessment/Plan Principal Problem:   Altered mental status   Abnormal CT of the head Likely due to CVA vs encephalopathy NOS. Frequent neuro checks. Check full CVA work-up. Fasting lipids, hemoglobin A1c. Carotid Doppler and echo. MR/MRA to brain. Consult PT/OT/SLP. Consult neurology.  Active Problems:   Syncope Continue cardiac monitoring. Trend troponin level. Carotid Doppler, echo and brain MRI. Consult cardiology.    Elevated troponin Discussed with cardiology  on-call. Continue to trend troponin level. Consult cardiology in the morning.    Hyponatremia Likely due to decreased oral intake and HCTZ. Continue normal saline infusion. Check urine sodium. Check urine osmolality. Follow-up sodium level.    AKI (acute kidney injury) ([place]) Has mild elevation of total CK Hold telmisartan and hydrochlorothiazide. Continue IV fluids. Monitor intake and output. Follow-up renal function and electrolytes.    Hypertension Allow permissive hypertension. Held due to AKI as well. Monitor blood pressure    Type 2 diabetes mellitus (HCC) Carbohydrate modified diet. Hold metformin due to AKI. CBG monitoring with regular insulin sliding scale. Check hemoglobin A1c.   DVT prophylaxis: Lovenox SQ. Code Status: Full code Family Communication: None at bedside. Disposition Plan: Admit for  CVA work-up and troponin level trending. Consults called: Routine neurology and cardiology consults. Admission status: Observation/telemetry.   Reubin Milan MD [facility] Pager [phone]  If 7PM-7AM, please contact night-coverage [email or web address] Password [email or web address]  08/23/2017, 2:42 AM

## 2017-08-23 NOTE — Progress Notes (Signed)
EEG completed, results pending. 

## 2017-08-24 DIAGNOSIS — R748 Abnormal levels of other serum enzymes: Secondary | ICD-10-CM | POA: Diagnosis not present

## 2017-08-24 DIAGNOSIS — N179 Acute kidney failure, unspecified: Secondary | ICD-10-CM | POA: Diagnosis not present

## 2017-08-24 DIAGNOSIS — E871 Hypo-osmolality and hyponatremia: Secondary | ICD-10-CM

## 2017-08-24 DIAGNOSIS — I10 Essential (primary) hypertension: Secondary | ICD-10-CM | POA: Diagnosis not present

## 2017-08-24 DIAGNOSIS — R55 Syncope and collapse: Principal | ICD-10-CM

## 2017-08-24 DIAGNOSIS — I639 Cerebral infarction, unspecified: Secondary | ICD-10-CM | POA: Diagnosis not present

## 2017-08-24 DIAGNOSIS — R404 Transient alteration of awareness: Secondary | ICD-10-CM | POA: Diagnosis not present

## 2017-08-24 DIAGNOSIS — Z8673 Personal history of transient ischemic attack (TIA), and cerebral infarction without residual deficits: Secondary | ICD-10-CM | POA: Diagnosis present

## 2017-08-24 LAB — CBC
HEMATOCRIT: 40.9 % (ref 39.0–52.0)
HEMOGLOBIN: 13.6 g/dL (ref 13.0–17.0)
MCH: 29.6 pg (ref 26.0–34.0)
MCHC: 33.3 g/dL (ref 30.0–36.0)
MCV: 89.1 fL (ref 78.0–100.0)
Platelets: 261 10*3/uL (ref 150–400)
RBC: 4.59 MIL/uL (ref 4.22–5.81)
RDW: 13.7 % (ref 11.5–15.5)
WBC: 7.5 10*3/uL (ref 4.0–10.5)

## 2017-08-24 LAB — BASIC METABOLIC PANEL
ANION GAP: 7 (ref 5–15)
BUN: 15 mg/dL (ref 6–20)
CHLORIDE: 106 mmol/L (ref 101–111)
CO2: 23 mmol/L (ref 22–32)
Calcium: 8.5 mg/dL — ABNORMAL LOW (ref 8.9–10.3)
Creatinine, Ser: 1.17 mg/dL (ref 0.61–1.24)
GFR calc Af Amer: >60 mL/min (ref >60)
Glucose, Bld: 129 mg/dL — ABNORMAL HIGH (ref 65–99)
POTASSIUM: 3.8 mmol/L (ref 3.5–5.1)
SODIUM: 136 mmol/L (ref 135–145)

## 2017-08-24 LAB — TSH: TSH: 2.784 u[IU]/mL (ref 0.350–4.500)

## 2017-08-24 LAB — GLUCOSE, CAPILLARY
GLUCOSE-CAPILLARY: 110 mg/dL — AB (ref 65–99)
Glucose-Capillary: 128 mg/dL — ABNORMAL HIGH (ref 65–99)

## 2017-08-24 LAB — VITAMIN B12: VITAMIN B 12: 317 pg/mL (ref 180–914)

## 2017-08-24 MED ORDER — ATORVASTATIN CALCIUM 20 MG PO TABS: 20.0000 mg | ORAL_TABLET | Freq: Every day | ORAL | 1 refills | Status: DC

## 2017-08-24 MED ORDER — AMLODIPINE BESYLATE 10 MG PO TABS: 10.0000 mg | ORAL_TABLET | Freq: Every day | ORAL | 1 refills | Status: DC

## 2017-08-24 MED ORDER — ASPIRIN 325 MG PO TABS: 325.0000 mg | ORAL_TABLET | Freq: Every day | ORAL | 1 refills | Status: DC

## 2017-08-24 NOTE — Progress Notes (Signed)
Physical Therapy Treatment Patient Details Name: Albert Garrison MRN: [id number] DOB: 1946/03/03 Today's Date: 08/24/2017    History of Present Illness Albert Garrison is a 72 y.o. male with medical history significant of osteoarthritis, history of complicated UTI, history of depression, diet-controlled type 2 diabetes, GERD, hypertension, history of pneumonia, history of rheumatic fever at age 42, history of sleep apnea but does not use CPAP, hyponatremia who is brought to the emergency department via EMS after 911 was called due to the patient found wandering on someone else's yard. Apparently, the patient went hiking, but then lost the Trail.  He stated that he is sweated a lot.  Then ended up in somebody else's yard, EMS was called and the patient subsequently had a syncopal episode.  He is unable to tell much history, but denies headache, chest pain, abdominal pain or any other type of pain when asked now.  However, obtaining history from vision remains difficult, as he continues to be confused. MRI positive for acute R CVA    PT Comments    Patient progressing with physical therapy. Modified independent bed mobility; supervision for transfers and ambulation 600 feet without assistive devices. No complaints of fatigue but did speak of legs being sore.  Patient would continue to benefit from skilled physical therapy in current environment and next venue to continue return to prior function and increase strength, endurance, balance, coordination, and functional mobility and gait skills.     Follow Up Recommendations  Home health PT     Equipment Recommendations  None recommended by PT    Recommendations for Other Services       Precautions / Restrictions Precautions Precautions: None Restrictions Weight Bearing Restrictions: No    Mobility  Bed Mobility Overal bed mobility: Modified Independent                Transfers Overall transfer level: Needs assistance Equipment  used: None Transfers: Sit to/from Stand;Stand Pivot Transfers Sit to Stand: Supervision Stand pivot transfers: Supervision       General transfer comment: slightly unsteady movement  Ambulation/Gait Ambulation/Gait assistance: Supervision Ambulation Distance (Feet): 600 Feet Assistive device: None Gait Pattern/deviations: Decreased step length - right;Decreased step length - left;Decreased stride length;Step-through pattern Gait velocity: decreased   General Gait Details: slightly unsteady mildly slow cadence without loss of balance, no leaning on near by objects   Stairs             Wheelchair Mobility    Modified Rankin (Stroke Patients Only)       Balance Overall balance assessment: Mild deficits observed, not formally tested                                          Cognition Arousal/Alertness: Awake/alert Behavior During Therapy: WFL for tasks assessed/performed Overall Cognitive Status: Impaired/Different from baseline                                 General Comments: when asked questions, answers generalized and not always appropriate to question      Exercises General Exercises - Upper Extremity Shoulder Flexion: AROM;Strengthening;Both;10 reps;Seated General Exercises - Lower Extremity Long Arc Quad: AROM;Strengthening;Both;10 reps;Seated Hip Flexion/Marching: Strengthening;Both;10 reps;Seated Toe Raises: AROM;Strengthening;Both;10 reps;Seated Heel Raises: AROM;Strengthening;Both;10 reps;Seated    General Comments        Pertinent Vitals/Pain  Pain Assessment: No/denies pain    Home Living                      Prior Function            PT Goals (current goals can now be found in the care plan section) Progress towards PT goals: Progressing toward goals    Frequency    7X/week      PT Plan Current plan remains appropriate    Co-evaluation              AM-PAC PT "6 Clicks" Daily  Activity  Outcome Measure  Difficulty turning over in bed (including adjusting bedclothes, sheets and blankets)?: None Difficulty moving from lying on back to sitting on the side of the bed? : None Difficulty sitting down on and standing up from a chair with arms (e.g., wheelchair, bedside commode, etc,.)?: None Help needed moving to and from a bed to chair (including a wheelchair)?: None Help needed walking in hospital room?: A Little Help needed climbing 3-5 steps with a railing? : A Little 6 Click Score: 22    End of Session Equipment Utilized During Treatment: Gait belt Activity Tolerance: Patient tolerated treatment well Patient left: in chair;with call bell/phone within reach;with family/visitor present Nurse Communication: Mobility status PT Visit Diagnosis: Unsteadiness on feet (R26.81);Other abnormalities of gait and mobility (R26.89);Muscle weakness (generalized) (M62.81)     Time: 1230-1300 PT Time Calculation (min) (ACUTE ONLY): 30 min  Charges:  $Gait Training: 8-22 mins $Therapeutic Activity: 8-22 mins                    G Codes:       Bruce Mayers D. Hartnett-Rands, MS, PT Per [place] [phone] 08/24/2017, 1:08 PM

## 2017-08-24 NOTE — Progress Notes (Signed)
Patient ID: Albert Garrison, male   DOB: Aug 12, 1945, 72 y.o.   MRN: [id number]    Subjective:  Denies SSCP, palpitations or Dyspnea   Objective:  Vitals:   08/23/17 1800 08/23/17 2000 08/24/17 0003 08/24/17 0404  BP:  (!) 170/78 (!) 171/86 (!) 152/71  Pulse: 61 64 69 62  Resp: 17  18 18   Temp:  97.6 F (36.4 C) 97.7 F (36.5 C) 98.2 F (36.8 C)  TempSrc:  Oral Oral Oral  SpO2: 94% 97% 95% 97%  Weight:      Height:        Intake/Output from previous day:  Intake/Output Summary (Last 24 hours) at 08/24/2017 0831 Last data filed at 08/23/2017 1700 Gross per 24 hour  Intake 900 ml  Output 750 ml  Net 150 ml    Physical Exam: Affect appropriate Healthy:  appears stated age HEENT: normal Neck supple with no adenopathy JVP normal no bruits no thyromegaly Lungs clear with no wheezing and good diaphragmatic motion Heart:  S1/S2 no murmur, no rub, gallop or click PMI normal Abdomen: benighn, BS positve, no tenderness, no AAA no bruit.  No HSM or HJR Distal pulses intact with no bruits No edema Neuro still with some dis orientation  Skin warm and dry No muscular weakness   Lab Results: Basic Metabolic Panel: Recent Labs    08/22/17 2220 08/23/17 0113 08/24/17 0557  NA 131*  --  136  K 4.3  --  3.8  CL 99*  --  106  CO2 21*  --  23  GLUCOSE 146*  --  129*  BUN 24*  --  15  CREATININE 1.87*  --  1.17  CALCIUM 9.1  --  8.5*  MG  --  2.2  --   PHOS  --  3.9  --    Liver Function Tests: Recent Labs    08/22/17 2220  AST 25  ALT 13*  ALKPHOS 50  BILITOT 1.2  PROT 6.8  ALBUMIN 4.0   No results for input(s): LIPASE, AMYLASE in the last 72 hours. CBC: Recent Labs    08/22/17 2220 08/24/17 0557  WBC 17.2* 7.5  NEUTROABS 15.4*  --   HGB 13.9 13.6  HCT 41.2 40.9  MCV 86.9 89.1  PLT 287 261   Cardiac Enzymes: Recent Labs    08/22/17 2220 08/23/17 0113 08/23/17 0920  CKTOTAL  --  598*  --   TROPONINI 1.21* 0.08* 0.07*   BNP: Invalid input(s):  POCBNP D-Dimer: No results for input(s): DDIMER in the last 72 hours. Hemoglobin A1C: Recent Labs    08/22/17 2220  HGBA1C 7.1*   Fasting Lipid Panel: Recent Labs    08/23/17 0113  CHOL 169  HDL 37*  LDLCALC 108*  TRIG 118  CHOLHDL 4.6   Thyroid Function Tests: No results for input(s): TSH, T4TOTAL, T3FREE, THYROIDAB in the last 72 hours.  Invalid input(s): FREET3 Anemia Panel: No results for input(s): VITAMINB12, FOLATE, FERRITIN, TIBC, IRON, RETICCTPCT in the last 72 hours.  Imaging: Dg Chest 2 View  Result Date: 08/23/2017 CLINICAL DATA:  Altered mental status. EXAM: CHEST - 2 VIEW COMPARISON:  08/22/2017 and 03/24/2014 and CT scan of the abdomen dated 05/31/2009 FINDINGS: The heart size and pulmonary vascularity are normal. Prominent left pericardial fat pad. No infiltrates or effusions. No bone abnormality. IMPRESSION: No active cardiopulmonary disease. Electronically Signed   By: Lorriane Shire M.D.   On: 08/23/2017 08:41   Dg Chest 2 View  Result  Date: 08/22/2017 CLINICAL DATA:  Syncope. EXAM: CHEST - 2 VIEW COMPARISON:  03/24/2014 FINDINGS: The cardiomediastinal contours are normal. Minor left lung base atelectasis. Pulmonary vasculature is normal. No consolidation, pleural effusion, or pneumothorax. No acute osseous abnormalities are seen. Cervical spine fusion hardware partially included. IMPRESSION: Minor left lung base atelectasis. Electronically Signed   By: Jeb Levering M.D.   On: 08/22/2017 23:20   Ct Head Wo Contrast  Result Date: 08/22/2017 CLINICAL DATA:  Altered level of consciousness. Found wandering in someone's yard, syncopal episode upon arrival of EMS. EXAM: CT HEAD WITHOUT CONTRAST TECHNIQUE: Contiguous axial images were obtained from the base of the skull through the vertex without intravenous contrast. COMPARISON:  None. FINDINGS: Brain: Ill-defined 7 x 16 mm low-density in the right basal ganglia suspicious for acute/subacute ischemia. No  hemorrhage. Mega cisterna magna is incidentally noted. Normal for age atrophy. No midline shift or mass effect. No subdural or extra-axial collection. Vascular: Atherosclerosis of skullbase vasculature without hyperdense vessel or abnormal calcification. Skull: No fracture or focal lesion. Sinuses/Orbits: Paranasal sinuses and mastoid air cells are clear. The visualized orbits are unremarkable. Other: None. IMPRESSION: Findings suspicious for acute/subacute ischemia in the right basal ganglia. No hemorrhage. Electronically Signed   By: Jeb Levering M.D.   On: 08/22/2017 23:15   Mr Brain Wo Contrast  Result Date: 08/23/2017 CLINICAL DATA:  Altered mental status. Syncope. Right basal ganglia infarct on CT. EXAM: MRI HEAD WITHOUT CONTRAST MRA HEAD WITHOUT CONTRAST TECHNIQUE: Multiplanar, multiecho pulse sequences of the brain and surrounding structures were obtained without intravenous contrast. Angiographic images of the head were obtained using MRA technique without contrast. COMPARISON:  Head CT 08/22/2017 and MRI 04/02/2010 FINDINGS: MRI HEAD FINDINGS Brain: An acute right basal ganglia infarct involves both the caudate and lentiform nuclei and measures 3 x 1.5 cm. No intracranial hemorrhage, mass, midline shift, or extra-axial fluid collection is identified. Mild cerebral atrophy is within normal limits for age. There is a mega cisterna magna, a normal variant. No significant chronic white matter disease is present for age. Vascular: Mildly abnormal appearance of the distal right vertebral artery is unchanged from the prior MRI and more fully evaluated below. Other major intracranial vascular flow voids are preserved. Skull and upper cervical spine: Unremarkable bone marrow signal. Sinuses/Orbits: Unremarkable orbits. Paranasal sinuses and mastoid air cells are clear. Other: None. MRA HEAD FINDINGS The study is mildly motion degraded. The visualized distal left vertebral artery is widely patent and  dominant. There is a segmental region of signal loss involving the distal most right V3 and proximal V4 segments with the vessel appearing patent proximal to this in the included V3 segment as well as patent more distally to the PICA origin. This region of signal loss may be artifactual or reflect a segmental occlusion or reduced flow from an underlying V3-V4 junction stenosis. The right vertebral artery distal to the PICA origin is not visualized and is likely occluded. The basilar artery is patent with minimal narrowing proximally. Posterior communicating arteries are not identified and may be diminutive or absent. The PCAs are patent without evidence of flow limiting proximal stenosis. There is at most mild narrowing versus mild motion artifact involving both P1 segments. There is asymmetric attenuation of the P2 and more distal portions of the right PCA. The internal carotid arteries are patent from skull base to carotid termini with mild-to-moderate paraclinoid stenoses bilaterally. ACAs and MCAs are patent without evidence of proximal branch occlusion. There is the suggestion of mild  right and moderate left proximal A1 stenoses and a mild to moderate right mid M1 stenosis, however motion may exaggerate the appearance. No aneurysm is identified. IMPRESSION: 1. Acute right basal ganglia infarct. 2. Occlusion of the non-dominant right vertebral artery distal to PICA. Segmental occlusion versus reduced flow and/or artifact more proximally in the right V4 segment. 3. No major anterior circulation branch occlusion. Mild-to-moderate bilateral ICA, proximal ACA, and right M1 stenoses. Electronically Signed   By: Logan Bores M.D.   On: 08/23/2017 08:32   US Carotid Bilateral (at [facility] And [facility] Only)  Result Date: 08/23/2017 CLINICAL DATA:  72 year old male with acute right basal ganglia infarct and chronic right intracranial vertebral artery occlusion EXAM: BILATERAL CAROTID DUPLEX ULTRASOUND TECHNIQUE: Pearline Cables scale  imaging, color Doppler and duplex ultrasound were performed of bilateral carotid and vertebral arteries in the neck. COMPARISON:  Brain MRI and MRA 08/23/2016 FINDINGS: Criteria: Quantification of carotid stenosis is based on velocity parameters that correlate the residual internal carotid diameter with NASCET-based stenosis levels, using the diameter of the distal internal carotid lumen as the denominator for stenosis measurement. The following velocity measurements were obtained: RIGHT ICA: 67/22 cm/sec CCA: 601/09 cm/sec SYSTOLIC ICA/CCA RATIO:  0.7 ECA:  92 cm/sec LEFT ICA: 80/9 cm/sec CCA: 323/55 cm/sec SYSTOLIC ICA/CCA RATIO:  0.7 ECA:  98 cm/sec RIGHT CAROTID ARTERY: Mild smooth heterogeneous atherosclerotic plaque in the common carotid artery. No evidence of significant atherosclerotic plaque or stenosis in the internal carotid artery. RIGHT VERTEBRAL ARTERY:  Patent with normal antegrade flow. LEFT CAROTID ARTERY: Mild heterogeneous atherosclerotic plaque in the distal common carotid artery extending into the proximal internal carotid artery. By peak systolic velocity criteria, the estimated stenosis remains less than 50%. LEFT VERTEBRAL ARTERY:  Patent with normal antegrade flow. IMPRESSION: 1. Mild (1-49%) stenosis proximal left internal carotid artery secondary to heterogenous atherosclerotic plaque. 2. No significant atherosclerotic plaque or stenosis in the right internal carotid artery. 3. The bilateral vertebral arteries are patent with normal antegrade flow. Signed, Criselda Peaches, MD [facility] Electronically Signed   By: Jacqulynn Cadet M.D.   On: 08/23/2017 09:05   Mr Jodene Nam Head/brain DD Cm  Result Date: 08/23/2017 CLINICAL DATA:  Altered mental status. Syncope. Right basal ganglia infarct on CT. EXAM: MRI HEAD WITHOUT CONTRAST MRA HEAD WITHOUT CONTRAST TECHNIQUE: Multiplanar, multiecho pulse sequences of the brain and  surrounding structures were obtained without intravenous contrast. Angiographic images of the head were obtained using MRA technique without contrast. COMPARISON:  Head CT 08/22/2017 and MRI 04/02/2010 FINDINGS: MRI HEAD FINDINGS Brain: An acute right basal ganglia infarct involves both the caudate and lentiform nuclei and measures 3 x 1.5 cm. No intracranial hemorrhage, mass, midline shift, or extra-axial fluid collection is identified. Mild cerebral atrophy is within normal limits for age. There is a mega cisterna magna, a normal variant. No significant chronic white matter disease is present for age. Vascular: Mildly abnormal appearance of the distal right vertebral artery is unchanged from the prior MRI and more fully evaluated below. Other major intracranial vascular flow voids are preserved. Skull and upper cervical spine: Unremarkable bone marrow signal. Sinuses/Orbits: Unremarkable orbits. Paranasal sinuses and mastoid air cells are clear. Other: None. MRA HEAD FINDINGS The study is mildly motion degraded. The visualized distal left vertebral artery is widely patent and dominant. There is a segmental region of signal loss involving the distal most right V3 and proximal V4 segments with the vessel appearing patent proximal to this  in the included V3 segment as well as patent more distally to the PICA origin. This region of signal loss may be artifactual or reflect a segmental occlusion or reduced flow from an underlying V3-V4 junction stenosis. The right vertebral artery distal to the PICA origin is not visualized and is likely occluded. The basilar artery is patent with minimal narrowing proximally. Posterior communicating arteries are not identified and may be diminutive or absent. The PCAs are patent without evidence of flow limiting proximal stenosis. There is at most mild narrowing versus mild motion artifact involving both P1 segments. There is asymmetric attenuation of the P2 and more distal portions  of the right PCA. The internal carotid arteries are patent from skull base to carotid termini with mild-to-moderate paraclinoid stenoses bilaterally. ACAs and MCAs are patent without evidence of proximal branch occlusion. There is the suggestion of mild right and moderate left proximal A1 stenoses and a mild to moderate right mid M1 stenosis, however motion may exaggerate the appearance. No aneurysm is identified. IMPRESSION: 1. Acute right basal ganglia infarct. 2. Occlusion of the non-dominant right vertebral artery distal to PICA. Segmental occlusion versus reduced flow and/or artifact more proximally in the right V4 segment. 3. No major anterior circulation branch occlusion. Mild-to-moderate bilateral ICA, proximal ACA, and right M1 stenoses. Electronically Signed   By: Logan Bores M.D.   On: 08/23/2017 08:32    Cardiac Studies:  ECG: NSR PVC   Telemetry: NSR no arrhythmia   Echo: EF 55-60% mild AR   Medications:   . amLODipine  10 mg Oral Daily  . aspirin  325 mg Oral Daily  . atorvastatin  20 mg Oral q1800  . enoxaparin (LOVENOX) injection  40 mg Subcutaneous Q24H  . insulin aspart  0-15 Units Subcutaneous TID WC     . sodium chloride 100 mL/hr at 08/24/17 5956    Assessment/Plan:  Elevated troponin:  No cardiac symptoms ECG ok echo with no RWMA;s no need for further w/u CVA: thrombotic not embolic would Rx with plavix and ASA ? recs from neuro HLD: in setting of stroke continue statin  HTN:  On amlodipine   Will sign off   Jenkins Rouge 08/24/2017, 8:31 AM

## 2017-08-24 NOTE — Discharge Summary (Addendum)
Physician Discharge Summary  BILLYE NYDAM GUR:[id number] DOB: 09-23-1945 DOA: 08/22/2017  PCP: Celene Squibb, MD  Admit date: 08/22/2017 Discharge date: 08/24/2017  Admitted From: Home Disposition: Home  Recommendations for Outpatient Follow-up:  1. Follow up with PCP in 1-2 weeks 2. Please obtain BMP/CBC in one week  Home Health: Home health PT and speech therapy Equipment/Devices:  Discharge Condition: Stable CODE STATUS: Full code Diet recommendation: Heart Healthy / Carb Modified   Brief/Interim Summary: 72 year old male with a history of diet-controlled diabetes, hypertension, obstructive sleep apnea, was brought to the hospital by EMS when he was found wandering in someone else's yard.  Patient had been hiking on a trail and had caught loss.  He was found in someone's yard and EMS was called.  He subsequently had a syncopal episode.  The patient was somnolent and confused on arrival to the emergency room.  He was noted to be dehydrated and had acute renal failure with a creatinine of 1.8.  He was adequately hydrated with IV fluids with improvement of creatinine to 1.1.  Mental status is also improved and he is awake and alert at this time.  Further work-up including MRI imaging of his brain confirmed right sided acute basal ganglia infarct.  Work-up including echocardiogram and carotid Dopplers were unrevealing.  Lipid panel showed mildly elevated LDL, so he was started on Lipitor.  A1c was mildly elevated at 7.1.  This can be further followed as an outpatient by primary care physician.  His blood pressure was noted to be elevated and he was started on amlodipine.  Heart rates were running in the 50s to 60s.  He was seen by physical therapy and speech therapy who recommended home health therapies.  He was followed by both neurology and cardiology in the hospital.  He is been started on daily aspirin.  Patient is feeling better at this time.  He is stable for discharge home.  Discharge  Diagnoses:  Principal Problem:   Altered mental status Active Problems:   Hyponatremia   Acute encephalopathy   AKI (acute kidney injury) (HCC)   Elevated troponin   Abnormal CT of the head   Hypertension   Type 2 diabetes mellitus (HCC)   Syncope   Acute CVA (cerebrovascular accident) [facility])    Discharge Instructions  Discharge Instructions    Diet - low sodium heart healthy   Complete by:  As directed    Increase activity slowly   Complete by:  As directed      Allergies as of 08/24/2017   No Known Allergies     Medication List    STOP taking these medications   hydrochlorothiazide 12.5 MG capsule Commonly known as:  MICROZIDE     TAKE these medications   amLODipine 10 MG tablet Commonly known as:  NORVASC Take 1 tablet (10 mg total) by mouth daily. Start taking on:  08/25/2017   aspirin 325 MG tablet Take 1 tablet (325 mg total) by mouth daily. Start taking on:  08/25/2017   atorvastatin 20 MG tablet Commonly known as:  LIPITOR Take 1 tablet (20 mg total) by mouth daily at 6 PM.   gabapentin 300 MG capsule Commonly known as:  NEURONTIN Take 300 mg by mouth 3 (three) times daily.       No Known Allergies  Consultations:  Neurology  Cardiology   Procedures/Studies: Dg Chest 2 View  Result Date: 08/23/2017 CLINICAL DATA:  Altered mental status. EXAM: CHEST - 2 VIEW COMPARISON:  08/22/2017  and 03/24/2014 and CT scan of the abdomen dated 05/31/2009 FINDINGS: The heart size and pulmonary vascularity are normal. Prominent left pericardial fat pad. No infiltrates or effusions. No bone abnormality. IMPRESSION: No active cardiopulmonary disease. Electronically Signed   By: Lorriane Shire M.D.   On: 08/23/2017 08:41   Dg Chest 2 View  Result Date: 08/22/2017 CLINICAL DATA:  Syncope. EXAM: CHEST - 2 VIEW COMPARISON:  03/24/2014 FINDINGS: The cardiomediastinal contours are normal. Minor left lung base atelectasis. Pulmonary vasculature is normal. No  consolidation, pleural effusion, or pneumothorax. No acute osseous abnormalities are seen. Cervical spine fusion hardware partially included. IMPRESSION: Minor left lung base atelectasis. Electronically Signed   By: Jeb Levering M.D.   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 Found wandering in someone's yard, syncopal episode upon arrival of EMS. EXAM: CT HEAD WITHOUT CONTRAST TECHNIQUE: Contiguous axial images were obtained from the base of the skull through the vertex without intravenous contrast. COMPARISON:  None. FINDINGS: Brain: Ill-defined 7 x 16 mm low-density in the right basal ganglia suspicious for acute/subacute ischemia. No hemorrhage. Mega cisterna magna is incidentally noted. Normal for age atrophy. No midline shift or mass effect. No subdural or extra-axial collection. Vascular: Atherosclerosis of skullbase vasculature without hyperdense vessel or abnormal calcification. Skull: No fracture or focal lesion. Sinuses/Orbits: Paranasal sinuses and mastoid air cells are clear. The visualized orbits are unremarkable. Other: None. IMPRESSION: Findings suspicious for acute/subacute ischemia in the right basal ganglia. No hemorrhage. Electronically Signed   By: Jeb Levering M.D.   On: 08/22/2017 23:15   Mr Brain Wo Contrast  Result Date: 08/23/2017 CLINICAL DATA:  Altered mental status. Syncope. Right basal ganglia infarct on CT. EXAM: MRI HEAD WITHOUT CONTRAST MRA HEAD WITHOUT CONTRAST TECHNIQUE: Multiplanar, multiecho pulse sequences of the brain and surrounding structures  were obtained without intravenous contrast. Angiographic images of the head were obtained using MRA technique without contrast. COMPARISON:  Head CT 08/22/2017 and MRI 04/02/2010 FINDINGS: MRI HEAD FINDINGS Brain: An acute right basal ganglia infarct involves both the caudate and lentiform nuclei and measures 3 x 1.5 cm. No intracranial hemorrhage, mass, midline shift, or extra-axial fluid collection is identified. Mild cerebral atrophy is within normal limits for age. There is a mega cisterna magna, a normal variant. No significant chronic white matter disease is present for age. Vascular: Mildly abnormal appearance of the distal right vertebral artery is unchanged from the prior MRI and more fully evaluated below. Other major intracranial vascular flow voids are preserved. Skull and upper cervical spine: Unremarkable bone marrow signal. Sinuses/Orbits: Unremarkable orbits. Paranasal sinuses and mastoid air cells are clear. Other: None. MRA HEAD FINDINGS The study is mildly motion degraded. The visualized distal left vertebral artery is widely patent and dominant. There is a segmental region of signal loss involving the distal most right V3 and proximal V4 segments with the vessel appearing patent proximal to this in the included V3 segment as well as patent more distally to the PICA origin. This region of signal loss may be artifactual or reflect a segmental occlusion or reduced flow from  an underlying V3-V4 junction stenosis. The right vertebral artery distal to the PICA origin is not visualized and is likely occluded. The basilar artery is patent with minimal narrowing proximally. Posterior communicating arteries are not identified and may be diminutive or absent. The PCAs are patent without evidence of flow limiting proximal stenosis. There is at most mild narrowing versus mild motion artifact involving both P1 segments. There is asymmetric attenuation of the P2 and more distal portions of the right PCA. The  internal carotid arteries are patent from skull base to carotid termini with mild-to-moderate paraclinoid stenoses bilaterally. ACAs and MCAs are patent without evidence of proximal branch occlusion. There is the suggestion of mild right and moderate left proximal A1 stenoses and a mild to moderate right mid M1 stenosis, however motion may exaggerate the appearance. No aneurysm is identified. IMPRESSION: 1. Acute right basal ganglia infarct. 2. Occlusion of the non-dominant right vertebral artery distal to PICA. Segmental occlusion versus reduced flow and/or artifact more proximally in the right V4 segment. 3. No major anterior circulation branch occlusion. Mild-to-moderate bilateral ICA, proximal ACA, and right M1 stenoses. Electronically Signed   By: Logan Bores M.D.   On: 08/23/2017 08:32   US Carotid Bilateral (at [facility] Only)  Result Date: 08/23/2017 CLINICAL DATA:  72 year old male with acute right basal ganglia infarct and chronic right intracranial vertebral artery occlusion EXAM: BILATERAL CAROTID DUPLEX ULTRASOUND TECHNIQUE: Pearline Cables scale imaging, color Doppler and duplex ultrasound were performed of bilateral carotid and vertebral arteries in the neck. COMPARISON:  Brain MRI and MRA 08/23/2016 FINDINGS: Criteria: Quantification of carotid stenosis is based on velocity parameters that correlate the residual internal carotid diameter with NASCET-based stenosis levels, using the diameter of the distal internal carotid lumen as the denominator for stenosis measurement. The following velocity measurements were obtained: RIGHT ICA: 67/22 cm/sec CCA: 510/25 cm/sec SYSTOLIC ICA/CCA RATIO:  0.7 ECA:  92 cm/sec LEFT ICA: 80/9 cm/sec CCA: 852/77 cm/sec SYSTOLIC ICA/CCA RATIO:  0.7 ECA:  98 cm/sec RIGHT CAROTID ARTERY: Mild smooth heterogeneous atherosclerotic plaque in the common carotid artery. No evidence of significant atherosclerotic plaque or stenosis in the internal carotid artery. RIGHT VERTEBRAL  ARTERY:  Patent with normal antegrade flow. LEFT CAROTID ARTERY: Mild heterogeneous atherosclerotic plaque in the distal common carotid artery extending into the proximal internal carotid artery. By peak systolic velocity criteria, the estimated stenosis remains less than 50%. LEFT VERTEBRAL ARTERY:  Patent with normal antegrade flow. IMPRESSION: 1. Mild (1-49%) stenosis proximal left internal carotid artery secondary to heterogenous atherosclerotic plaque. 2. No significant atherosclerotic plaque or stenosis in the right internal carotid artery. 3. The bilateral vertebral arteries are patent with normal antegrade flow. Signed, Criselda Peaches, MD [facility] Electronically Signed   By: Jacqulynn Cadet M.D.   On: 08/23/2017 09:05   Mr Jodene Nam Head/brain OE Cm  Result Date: 08/23/2017 CLINICAL DATA:  Altered mental status. Syncope. Right basal ganglia infarct on CT. EXAM: MRI HEAD WITHOUT CONTRAST MRA HEAD WITHOUT CONTRAST TECHNIQUE: Multiplanar, multiecho pulse sequences of the brain and surrounding structures were obtained without intravenous contrast. Angiographic images of the head were obtained using MRA technique without contrast. COMPARISON:  Head CT 08/22/2017 and MRI 04/02/2010 FINDINGS: MRI HEAD FINDINGS Brain: An acute right basal ganglia infarct involves both the caudate and lentiform nuclei and measures 3 x 1.5 cm. No intracranial hemorrhage, mass, midline shift, or extra-axial fluid collection is identified. Mild cerebral atrophy is within normal limits for age.  There is a mega cisterna magna, a normal variant. No significant chronic white matter disease is present for age. Vascular: Mildly abnormal appearance of the distal right vertebral artery is unchanged from the prior MRI and more fully evaluated below. Other major intracranial vascular flow voids are preserved. Skull and upper cervical spine: Unremarkable bone marrow signal.  Sinuses/Orbits: Unremarkable orbits. Paranasal sinuses and mastoid air cells are clear. Other: None. MRA HEAD FINDINGS The study is mildly motion degraded. The visualized distal left vertebral artery is widely patent and dominant. There is a segmental region of signal loss involving the distal most right V3 and proximal V4 segments with the vessel appearing patent proximal to this in the included V3 segment as well as patent more distally to the PICA origin. This region of signal loss may be artifactual or reflect a segmental occlusion or reduced flow from an underlying V3-V4 junction stenosis. The right vertebral artery distal to the PICA origin is not visualized and is likely occluded. The basilar artery is patent with minimal narrowing proximally. Posterior communicating arteries are not identified and may be diminutive or absent. The PCAs are patent without evidence of flow limiting proximal stenosis. There is at most mild narrowing versus mild motion artifact involving both P1 segments. There is asymmetric attenuation of the P2 and more distal portions of the right PCA. The internal carotid arteries are patent from skull base to carotid termini with mild-to-moderate paraclinoid stenoses bilaterally. ACAs and MCAs are patent without evidence of proximal branch occlusion. There is the suggestion of mild right and moderate left proximal A1 stenoses and a mild to moderate right mid M1 stenosis, however motion may exaggerate the appearance. No aneurysm is identified. IMPRESSION: 1. Acute right basal ganglia infarct. 2. Occlusion of the non-dominant right vertebral artery distal to PICA. Segmental occlusion versus reduced flow and/or artifact more proximally in the right V4 segment. 3. No major anterior circulation branch occlusion. Mild-to-moderate bilateral ICA, proximal ACA, and right M1 stenoses. Electronically Signed   By: Logan Bores M.D.   On: 08/23/2017 08:32   Echo: - Left ventricle: Anbormal septal  motion The cavity size was   normal. Wall thickness was increased in a pattern of moderate   LVH. Systolic function was normal. The estimated ejection   fraction was in the range of 55% to 60%. Wall motion was normal;   there were no regional wall motion abnormalities. Left   ventricular diastolic function parameters were normal. - Aortic valve: There was mild regurgitation. - Left atrium: The atrium was mildly dilated. - Atrial septum: No defect or patent foramen ovale was identified.  EEG: This is a normal recording of awake and sleep states.    Subjective: Feeling better.  No new complaints.  Discharge Exam: Vitals:   08/24/17 0404 08/24/17 0800  BP: (!) 152/71 (!) 165/87  Pulse: 62 (!) 58  Resp: 18 16  Temp: 98.2 F (36.8 C) 98 F (36.7 C)  SpO2: 97% 97%   Vitals:   08/23/17 2000 08/24/17 0003 08/24/17 0404 08/24/17 0800  BP: (!) 170/78 (!) 171/86 (!) 152/71 (!) 165/87  Pulse: 64 69 62 (!) 58  Resp:  18 18 16   Temp: 97.6 F (36.4 C) 97.7 F (36.5 C) 98.2 F (36.8 C) 98 F (36.7 C)  TempSrc: Oral Oral Oral Oral  SpO2: 97% 95% 97% 97%  Weight:      Height:        General: Pt is alert, awake, not in acute distress Cardiovascular:  RRR, S1/S2 +, no rubs, no gallops Respiratory: CTA bilaterally, no wheezing, no rhonchi Abdominal: Soft, NT, ND, bowel sounds + Extremities: no edema, no cyanosis    The results of significant diagnostics from this hospitalization (including imaging, microbiology, ancillary and laboratory) are listed below for reference.     Microbiology: Recent Results (from the past 240 hour(s))  MRSA PCR Screening     Status: None   Collection Time: 08/23/17  3:15 AM  Result Value Ref Range Status   MRSA by PCR NEGATIVE NEGATIVE Final    Comment:        The GeneXpert MRSA Assay (FDA approved for NASAL specimens only), is one component of a comprehensive MRSA colonization surveillance program. It is not intended to diagnose MRSA infection  nor to guide or monitor treatment for MRSA infections. Performed at [facility], [address] [place], [place] [address]      Labs: BNP (last 3 results) No results for input(s): BNP in the last 8760 hours. Basic Metabolic Panel: Recent Labs  Lab 08/22/17 2220 08/23/17 0113 08/24/17 0557  NA 131*  --  136  K 4.3  --  3.8  CL 99*  --  106  CO2 21*  --  23  GLUCOSE 146*  --  129*  BUN 24*  --  15  CREATININE 1.87*  --  1.17  CALCIUM 9.1  --  8.5*  MG  --  2.2  --   PHOS  --  3.9  --    Liver Function Tests: Recent Labs  Lab 08/22/17 2220  AST 25  ALT 13*  ALKPHOS 50  BILITOT 1.2  PROT 6.8  ALBUMIN 4.0   No results for input(s): LIPASE, AMYLASE in the last 168 hours. No results for input(s): AMMONIA in the last 168 hours. CBC: Recent Labs  Lab 08/22/17 2220 08/24/17 0557  WBC 17.2* 7.5  NEUTROABS 15.4*  --   HGB 13.9 13.6  HCT 41.2 40.9  MCV 86.9 89.1  PLT 287 261   Cardiac Enzymes: Recent Labs  Lab 08/22/17 2220 08/23/17 0113 08/23/17 0920  CKTOTAL  --  598*  --   TROPONINI 1.21* 0.08* 0.07*   BNP: Invalid input(s): POCBNP CBG: Recent Labs  Lab 08/23/17 1139 08/23/17 1607 08/23/17 2120 08/24/17 0750 08/24/17 1146  GLUCAP 164* 144* 142* 128* 110*   D-Dimer No results for input(s): DDIMER in the last 72 hours. Hgb A1c Recent Labs    08/22/17 2220  HGBA1C 7.1*   Lipid Profile Recent Labs    08/23/17 0113  CHOL 169  HDL 37*  LDLCALC 108*  TRIG 118  CHOLHDL 4.6   Thyroid function studies Recent Labs    08/24/17 0830  TSH 2.784   Anemia work up No results for input(s): VITAMINB12, FOLATE, FERRITIN, TIBC, IRON, RETICCTPCT in the last 72 hours. Urinalysis    Component Value Date/Time   COLORURINE YELLOW 08/22/2017 2220   APPEARANCEUR CLEAR 08/22/2017 2220   LABSPEC 1.006 08/22/2017 2220   PHURINE 6.0 08/22/2017 2220   GLUCOSEU NEGATIVE 08/22/2017 2220   HGBUR SMALL (A) 08/22/2017 2220   BILIRUBINUR NEGATIVE 08/22/2017  2220   KETONESUR 5 (A) 08/22/2017 2220   PROTEINUR NEGATIVE 08/22/2017 2220   UROBILINOGEN 0.2 03/24/2014 1320   NITRITE NEGATIVE 08/22/2017 2220   LEUKOCYTESUR NEGATIVE 08/22/2017 2220   Sepsis Labs Invalid input(s): PROCALCITONIN,  WBC,  LACTICIDVEN Microbiology Recent Results (from the past 240 hour(s))  MRSA PCR Screening     Status: None  Collection Time: 08/23/17  3:15 AM  Result Value Ref Range Status   MRSA by PCR NEGATIVE NEGATIVE Final    Comment:        The GeneXpert MRSA Assay (FDA approved for NASAL specimens only), is one component of a comprehensive MRSA colonization surveillance program. It is not intended to diagnose MRSA infection nor to guide or monitor treatment for MRSA infections. Performed at [facility], [address], [place], [place] [address]      Time coordinating discharge: 41mins  SIGNED:   Kathie Dike, MD  [facility] 08/24/2017, 2:20 PM Pager   If 7PM-7AM, please contact night-coverage [email or web address] Password [email or web address]

## 2017-08-24 NOTE — Progress Notes (Signed)
Discharge instructions gone over with patient and family, verbalized understanding. IV removed, patient tolerated procedure well. 

## 2017-08-24 NOTE — Progress Notes (Addendum)
Progress Note  Patient Name: Albert Garrison Date of Encounter: 08/24/2017  Primary Cardiologist: New to Dr. Johnsie Cancel  Subjective   No complaints this AM, a little clearer per pt and aunt at bedside. Still struggles with some word finding difficulty when getting out the year and location.  Inpatient Medications    Scheduled Meds: . amLODipine  10 mg Oral Daily  . aspirin  325 mg Oral Daily  . atorvastatin  20 mg Oral q1800  . enoxaparin (LOVENOX) injection  40 mg Subcutaneous Q24H  . insulin aspart  0-15 Units Subcutaneous TID WC   Continuous Infusions: . sodium chloride 100 mL/hr at 08/24/17 0645   PRN Meds: acetaminophen **OR** acetaminophen (TYLENOL) oral liquid 160 mg/5 mL **OR** acetaminophen, ondansetron **OR** ondansetron (ZOFRAN) IV   Vital Signs    Vitals:   08/23/17 1800 08/23/17 2000 08/24/17 0003 08/24/17 0404  BP:  (!) 170/78 (!) 171/86 (!) 152/71  Pulse: 61 64 69 62  Resp: 17  18 18   Temp:  97.6 F (36.4 C) 97.7 F (36.5 C) 98.2 F (36.8 C)  TempSrc:  Oral Oral Oral  SpO2: 94% 97% 95% 97%  Weight:      Height:        Intake/Output Summary (Last 24 hours) at 08/24/2017 0803 Last data filed at 08/23/2017 1700 Gross per 24 hour  Intake 900 ml  Output 750 ml  Net 150 ml   Filed Weights   08/22/17 2155 08/23/17 0322  Weight: 188 lb (85.3 kg) 183 lb 3.2 oz (83.1 kg)    Telemetry    NSR with 2 brief runs of narrow complex tachycardia (regular), and 8 beats NSVT- Personally Reviewed  Physical Exam   GEN: No acute distress.  HEENT: Normocephalic, atraumatic, sclera non-icteric. Neck: No JVD or bruits. Cardiac: RRR no murmurs, rubs, or gallops.  Radials/DP/PT 1+ and equal bilaterally.  Respiratory: Clear to auscultation bilaterally. Breathing is unlabored. GI: Soft, nontender, non-distended, BS +x 4. MS: no deformity. Extremities: No clubbing or cyanosis. No edema. Distal pedal pulses are 2+ and equal bilaterally. Neuro:  AAOx3 but struggled a  little to get out the words 2019 and [facility]. Follows commands. Psych:  Responds to questions appropriately with a normal affect.  Labs    Chemistry Recent Labs  Lab 08/22/17 2220 08/24/17 0557  NA 131* 136  K 4.3 3.8  CL 99* 106  CO2 21* 23  GLUCOSE 146* 129*  BUN 24* 15  CREATININE 1.87* 1.17  CALCIUM 9.1 8.5*  PROT 6.8  --   ALBUMIN 4.0  --   AST 25  --   ALT 13*  --   ALKPHOS 50  --   BILITOT 1.2  --   GFRNONAA 34* >60  GFRAA 39* >60  ANIONGAP 11 7     Hematology Recent Labs  Lab 08/22/17 2220 08/24/17 0557  WBC 17.2* 7.5  RBC 4.74 4.59  HGB 13.9 13.6  HCT 41.2 40.9  MCV 86.9 89.1  MCH 29.3 29.6  MCHC 33.7 33.3  RDW 13.4 13.7  PLT 287 261    Cardiac Enzymes Recent Labs  Lab 08/22/17 2220 08/23/17 0113 08/23/17 0920  TROPONINI 1.21* 0.08* 0.07*   No results for input(s): TROPIPOC in the last 168 hours.   BNPNo results for input(s): BNP, PROBNP in the last 168 hours.   DDimer No results for input(s): DDIMER in the last 168 hours.   Radiology    Dg Chest 2 View  Result Date:  08/23/2017 CLINICAL DATA:  Altered mental status. EXAM: CHEST - 2 VIEW COMPARISON:  08/22/2017 and 03/24/2014 and CT scan of the abdomen dated 05/31/2009 FINDINGS: The heart size and pulmonary vascularity are normal. Prominent left pericardial fat pad. No infiltrates or effusions. No bone abnormality. IMPRESSION: No active cardiopulmonary disease. Electronically Signed   By: Lorriane Shire M.D.   On: 08/23/2017 08:41   Dg Chest 2 View  Result Date: 08/22/2017 CLINICAL DATA:  Syncope. EXAM: CHEST - 2 VIEW COMPARISON:  03/24/2014 FINDINGS: The cardiomediastinal contours are normal. Minor left lung base atelectasis. Pulmonary vasculature is normal. No consolidation, pleural effusion, or pneumothorax. No acute osseous abnormalities are seen. Cervical spine fusion hardware partially included. IMPRESSION: Minor left lung base atelectasis. Electronically Signed   By: Jeb Levering  M.D.   On: 08/22/2017 23:20   Ct Head Wo Contrast  Result Date: 08/22/2017 CLINICAL DATA:  Altered level of consciousness. Found wandering in someone's yard, syncopal episode upon arrival of EMS. EXAM: CT HEAD WITHOUT CONTRAST TECHNIQUE: Contiguous axial images were obtained from the base of the skull through the vertex without intravenous contrast. COMPARISON:  None. FINDINGS: Brain: Ill-defined 7 x 16 mm low-density in the right basal ganglia suspicious for acute/subacute ischemia. No hemorrhage. Mega cisterna magna is incidentally noted. Normal for age atrophy. No midline shift or mass effect. No subdural or extra-axial collection. Vascular: Atherosclerosis of skullbase vasculature without hyperdense vessel or abnormal calcification. Skull: No fracture or focal lesion. Sinuses/Orbits: Paranasal sinuses and mastoid air cells are clear. The visualized orbits are unremarkable. Other: None. IMPRESSION: Findings suspicious for acute/subacute ischemia in the right basal ganglia. No hemorrhage. Electronically Signed   By: Jeb Levering M.D.   On: 08/22/2017 23:15   Mr Brain Wo Contrast  Result Date: 08/23/2017 CLINICAL DATA:  Altered mental status. Syncope. Right basal ganglia infarct on CT. EXAM: MRI HEAD WITHOUT CONTRAST MRA HEAD WITHOUT CONTRAST TECHNIQUE: Multiplanar, multiecho pulse sequences of the brain and surrounding structures were obtained without intravenous contrast. Angiographic images of the head were obtained using MRA technique without contrast. COMPARISON:  Head CT 08/22/2017 and MRI 04/02/2010 FINDINGS: MRI HEAD FINDINGS Brain: An acute right basal ganglia infarct involves both the caudate and lentiform nuclei and measures 3 x 1.5 cm. No intracranial hemorrhage, mass, midline shift, or extra-axial fluid collection is identified. Mild cerebral atrophy is within normal limits for age. There is a mega cisterna magna, a normal variant. No significant chronic white matter disease is present for  age. Vascular: Mildly abnormal appearance of the distal right vertebral artery is unchanged from the prior MRI and more fully evaluated below. Other major intracranial vascular flow voids are preserved. Skull and upper cervical spine: Unremarkable bone marrow signal. Sinuses/Orbits: Unremarkable orbits. Paranasal sinuses and mastoid air cells are clear. Other: None. MRA HEAD FINDINGS The study is mildly motion degraded. The visualized distal left vertebral artery is widely patent and dominant. There is a segmental region of signal loss involving the distal most right V3 and proximal V4 segments with the vessel appearing patent proximal to this in the included V3 segment as well as patent more distally to the PICA origin. This region of signal loss may be artifactual or reflect a segmental occlusion or reduced flow from an underlying V3-V4 junction stenosis. The right vertebral artery distal to the PICA origin is not visualized and is likely occluded. The basilar artery is patent with minimal narrowing proximally. Posterior communicating arteries are not identified and may be diminutive or absent. The  PCAs are patent without evidence of flow limiting proximal stenosis. There is at most mild narrowing versus mild motion artifact involving both P1 segments. There is asymmetric attenuation of the P2 and more distal portions of the right PCA. The internal carotid arteries are patent from skull base to carotid termini with mild-to-moderate paraclinoid stenoses bilaterally. ACAs and MCAs are patent without evidence of proximal branch occlusion. There is the suggestion of mild right and moderate left proximal A1 stenoses and a mild to moderate right mid M1 stenosis, however motion may exaggerate the appearance. No aneurysm is identified. IMPRESSION: 1. Acute right basal ganglia infarct. 2. Occlusion of the non-dominant right vertebral artery distal to PICA. Segmental occlusion versus reduced flow and/or artifact more  proximally in the right V4 segment. 3. No major anterior circulation branch occlusion. Mild-to-moderate bilateral ICA, proximal ACA, and right M1 stenoses. Electronically Signed   By: Logan Bores M.D.   On: 08/23/2017 08:32   US Carotid Bilateral (at [facility] Only)  Result Date: 08/23/2017 CLINICAL DATA:  72 year old male with acute right basal ganglia infarct and chronic right intracranial vertebral artery occlusion EXAM: BILATERAL CAROTID DUPLEX ULTRASOUND TECHNIQUE: Pearline Cables scale imaging, color Doppler and duplex ultrasound were performed of bilateral carotid and vertebral arteries in the neck. COMPARISON:  Brain MRI and MRA 08/23/2016 FINDINGS: Criteria: Quantification of carotid stenosis is based on velocity parameters that correlate the residual internal carotid diameter with NASCET-based stenosis levels, using the diameter of the distal internal carotid lumen as the denominator for stenosis measurement. The following velocity measurements were obtained: RIGHT ICA: 67/22 cm/sec CCA: 626/94 cm/sec SYSTOLIC ICA/CCA RATIO:  0.7 ECA:  92 cm/sec LEFT ICA: 80/9 cm/sec CCA: 854/62 cm/sec SYSTOLIC ICA/CCA RATIO:  0.7 ECA:  98 cm/sec RIGHT CAROTID ARTERY: Mild smooth heterogeneous atherosclerotic plaque in the common carotid artery. No evidence of significant atherosclerotic plaque or stenosis in the internal carotid artery. RIGHT VERTEBRAL ARTERY:  Patent with normal antegrade flow. LEFT CAROTID ARTERY: Mild heterogeneous atherosclerotic plaque in the distal common carotid artery extending into the proximal internal carotid artery. By peak systolic velocity criteria, the estimated stenosis remains less than 50%. LEFT VERTEBRAL ARTERY:  Patent with normal antegrade flow. IMPRESSION: 1. Mild (1-49%) stenosis proximal left internal carotid artery secondary to heterogenous atherosclerotic plaque. 2. No significant atherosclerotic plaque or stenosis in the right internal carotid artery. 3. The bilateral vertebral  arteries are patent with normal antegrade flow. Signed, Criselda Peaches, MD [facility] Radiology Electronically Signed   By: Jacqulynn Cadet M.D.   On: 08/23/2017 09:05   Mr Jodene Nam Head/brain VO Cm  Result Date: 08/23/2017 CLINICAL DATA:  Altered mental status. Syncope. Right basal ganglia infarct on CT. EXAM: MRI HEAD WITHOUT CONTRAST MRA HEAD WITHOUT CONTRAST TECHNIQUE: Multiplanar, multiecho pulse sequences of the brain and surrounding structures were obtained without intravenous contrast. Angiographic images of the head were obtained using MRA technique without contrast. COMPARISON:  Head CT 08/22/2017 and MRI 04/02/2010 FINDINGS: MRI HEAD FINDINGS Brain: An acute right basal ganglia infarct involves both the caudate and lentiform nuclei and measures 3 x 1.5 cm. No intracranial hemorrhage, mass, midline shift, or extra-axial fluid collection is identified. Mild cerebral atrophy is within normal limits for age. There is a mega cisterna magna, a normal variant. No significant chronic white matter disease is present for age. Vascular: Mildly abnormal appearance of the distal right vertebral artery is unchanged from the prior MRI and more fully evaluated below. Other major intracranial  vascular flow voids are preserved. Skull and upper cervical spine: Unremarkable bone marrow signal. Sinuses/Orbits: Unremarkable orbits. Paranasal sinuses and mastoid air cells are clear. Other: None. MRA HEAD FINDINGS The study is mildly motion degraded. The visualized distal left vertebral artery is widely patent and dominant. There is a segmental region of signal loss involving the distal most right V3 and proximal V4 segments with the vessel appearing patent proximal to this in the included V3 segment as well as patent more distally to the PICA origin. This region of signal loss may be artifactual or reflect a segmental occlusion or reduced flow from an underlying V3-V4  junction stenosis. The right vertebral artery distal to the PICA origin is not visualized and is likely occluded. The basilar artery is patent with minimal narrowing proximally. Posterior communicating arteries are not identified and may be diminutive or absent. The PCAs are patent without evidence of flow limiting proximal stenosis. There is at most mild narrowing versus mild motion artifact involving both P1 segments. There is asymmetric attenuation of the P2 and more distal portions of the right PCA. The internal carotid arteries are patent from skull base to carotid termini with mild-to-moderate paraclinoid stenoses bilaterally. ACAs and MCAs are patent without evidence of proximal branch occlusion. There is the suggestion of mild right and moderate left proximal A1 stenoses and a mild to moderate right mid M1 stenosis, however motion may exaggerate the appearance. No aneurysm is identified. IMPRESSION: 1. Acute right basal ganglia infarct. 2. Occlusion of the non-dominant right vertebral artery distal to PICA. Segmental occlusion versus reduced flow and/or artifact more proximally in the right V4 segment. 3. No major anterior circulation branch occlusion. Mild-to-moderate bilateral ICA, proximal ACA, and right M1 stenoses. Electronically Signed   By: Logan Bores M.D.   On: 08/23/2017 08:32    Cardiac Studies   2d echo 08/23/17 Study Conclusions  - Left ventricle: Anbormal septal motion The cavity size was   normal. Wall thickness was increased in a pattern of moderate   LVH. Systolic function was normal. The estimated ejection   fraction was in the range of 55% to 60%. Wall motion was normal;   there were no regional wall motion abnormalities. Left   ventricular diastolic function parameters were normal. - Aortic valve: There was mild regurgitation. - Left atrium: The atrium was mildly dilated. - Atrial septum: No defect or patent foramen ovale was identified.  Patient Profile     72 y.o.  male with a hx of OA, complicated UTI, diet-controlled DM, HTN, GERD, rheumatic fever, OSA (does not use CPAP), hyponatremia (unclear prior workup), BPH, urinary retention, lower GIB 2017 (possibly related to polyp), probable CKD stage II who is being seen today for the evaluation of elevated troponin/possible syncope at the rest of Dr. Olevia Bowens.   Assessment & Plan    1. AMS with acute basal ganglia infarct: await further recs from neurology. Mild LICA stenosis noted, otherwise nonacute.  2. Elevated troponin - initial value likely lab error, subsequent values correlate appropriately with CK without evidence for ACS. Echo shows normal LV function. He is now on antiplatelets for #1.  3. Possible syncope: details not clear, reportedly happened when EMS arrived but no further details known. Tele shows very brief runs of SVT as well as 8 beat NSVT. 2D echo wnl. MG 2.2, K 3.8.  Consider addition of beta blocker to treat both this and the HTN. Check TSH.  For questions or updates, please contact [place] Please consult  [email or web address] for contact info under Cardiology/STEMI.  Signed, Charlie Pitter, PA-C 08/24/2017, 8:03 AM    See my progress note from same day   Jenkins Rouge

## 2017-08-24 NOTE — Discharge Instructions (Signed)
1. Follow up with PCP in 1-2 weeks °2. Please obtain BMP/CBC in one week ° ° ° °

## 2017-08-25 LAB — HOMOCYSTEINE: HOMOCYSTEINE-NORM: 15.8 umol/L — AB (ref 0.0–15.0)

## 2017-08-25 LAB — RPR: RPR: NONREACTIVE

## 2017-08-26 DIAGNOSIS — R399 Unspecified symptoms and signs involving the genitourinary system: Secondary | ICD-10-CM | POA: Diagnosis not present

## 2017-08-26 DIAGNOSIS — Z7982 Long term (current) use of aspirin: Secondary | ICD-10-CM | POA: Diagnosis not present

## 2017-08-26 DIAGNOSIS — I69352 Hemiplegia and hemiparesis following cerebral infarction affecting left dominant side: Secondary | ICD-10-CM | POA: Diagnosis not present

## 2017-08-26 DIAGNOSIS — M25531 Pain in right wrist: Secondary | ICD-10-CM | POA: Diagnosis not present

## 2017-08-26 DIAGNOSIS — Z6825 Body mass index (BMI) 25.0-25.9, adult: Secondary | ICD-10-CM | POA: Diagnosis not present

## 2017-08-26 DIAGNOSIS — R600 Localized edema: Secondary | ICD-10-CM | POA: Diagnosis not present

## 2017-08-26 DIAGNOSIS — M25532 Pain in left wrist: Secondary | ICD-10-CM | POA: Diagnosis not present

## 2017-08-26 DIAGNOSIS — R2232 Localized swelling, mass and lump, left upper limb: Secondary | ICD-10-CM | POA: Diagnosis not present

## 2017-08-26 DIAGNOSIS — M1991 Primary osteoarthritis, unspecified site: Secondary | ICD-10-CM | POA: Diagnosis not present

## 2017-08-26 DIAGNOSIS — E119 Type 2 diabetes mellitus without complications: Secondary | ICD-10-CM | POA: Diagnosis not present

## 2017-08-26 DIAGNOSIS — Z87891 Personal history of nicotine dependence: Secondary | ICD-10-CM | POA: Diagnosis not present

## 2017-08-26 DIAGNOSIS — I10 Essential (primary) hypertension: Secondary | ICD-10-CM | POA: Diagnosis not present

## 2017-08-26 DIAGNOSIS — E1165 Type 2 diabetes mellitus with hyperglycemia: Secondary | ICD-10-CM | POA: Diagnosis not present

## 2017-08-26 DIAGNOSIS — G4733 Obstructive sleep apnea (adult) (pediatric): Secondary | ICD-10-CM | POA: Diagnosis not present

## 2017-08-26 DIAGNOSIS — Z87440 Personal history of urinary (tract) infections: Secondary | ICD-10-CM | POA: Diagnosis not present

## 2017-08-26 DIAGNOSIS — E871 Hypo-osmolality and hyponatremia: Secondary | ICD-10-CM | POA: Diagnosis not present

## 2017-08-26 DIAGNOSIS — N390 Urinary tract infection, site not specified: Secondary | ICD-10-CM | POA: Diagnosis not present

## 2017-08-26 DIAGNOSIS — F329 Major depressive disorder, single episode, unspecified: Secondary | ICD-10-CM | POA: Diagnosis not present

## 2017-08-26 DIAGNOSIS — I69320 Aphasia following cerebral infarction: Secondary | ICD-10-CM | POA: Diagnosis not present

## 2017-08-31 DIAGNOSIS — I639 Cerebral infarction, unspecified: Secondary | ICD-10-CM | POA: Diagnosis not present

## 2017-08-31 DIAGNOSIS — E119 Type 2 diabetes mellitus without complications: Secondary | ICD-10-CM | POA: Diagnosis not present

## 2017-08-31 DIAGNOSIS — E782 Mixed hyperlipidemia: Secondary | ICD-10-CM | POA: Diagnosis not present

## 2017-08-31 DIAGNOSIS — Z6825 Body mass index (BMI) 25.0-25.9, adult: Secondary | ICD-10-CM | POA: Diagnosis not present

## 2017-08-31 DIAGNOSIS — R944 Abnormal results of kidney function studies: Secondary | ICD-10-CM | POA: Diagnosis not present

## 2017-09-13 ENCOUNTER — Encounter: Payer: Self-pay | Admitting: Orthopedic Surgery

## 2017-09-13 ENCOUNTER — Ambulatory Visit (INDEPENDENT_AMBULATORY_CARE_PROVIDER_SITE_OTHER): Payer: PPO | Admitting: Orthopedic Surgery

## 2017-09-13 VITALS — BP 143/85 | HR 66 | Ht 71.0 in | Wt 183.0 lb

## 2017-09-13 DIAGNOSIS — M1812 Unilateral primary osteoarthritis of first carpometacarpal joint, left hand: Secondary | ICD-10-CM

## 2017-09-13 DIAGNOSIS — Z9889 Other specified postprocedural states: Secondary | ICD-10-CM

## 2017-09-13 NOTE — Progress Notes (Signed)
Routine appointment follow-up status post right carpal tunnel release on July 13, 2017  He has some residual numbness in the fingertips but he says he is very functional now and his pain is been much improved  His left thumb I had CMC arthritis but he says after splinting that is gotten better and he is using both hands normally  Physical Exam  Constitutional: He is oriented to person, place, and time. He appears well-developed and well-nourished.  Vital signs have been reviewed and are stable. Gen. appearance the patient is well-developed and well-nourished with normal grooming and hygiene.   Musculoskeletal:       Arms: Neurological: He is alert and oriented to person, place, and time.  Skin: Skin is warm and dry. No erythema.  Psychiatric: He has a normal mood and affect.  Vitals reviewed.  Dx  Encounter Diagnoses  Name Primary?  . S/P carpal tunnel release right 07/13/17 Yes  . Arthritis of carpometacarpal (CMC) joint of left thumb    We will return as needed he did not want any further medication at this time

## 2017-10-02 DIAGNOSIS — M1991 Primary osteoarthritis, unspecified site: Secondary | ICD-10-CM | POA: Diagnosis not present

## 2017-10-02 DIAGNOSIS — I69352 Hemiplegia and hemiparesis following cerebral infarction affecting left dominant side: Secondary | ICD-10-CM | POA: Diagnosis not present

## 2017-10-02 DIAGNOSIS — E871 Hypo-osmolality and hyponatremia: Secondary | ICD-10-CM | POA: Diagnosis not present

## 2017-10-02 DIAGNOSIS — G4733 Obstructive sleep apnea (adult) (pediatric): Secondary | ICD-10-CM | POA: Diagnosis not present

## 2017-10-02 DIAGNOSIS — E119 Type 2 diabetes mellitus without complications: Secondary | ICD-10-CM | POA: Diagnosis not present

## 2017-10-02 DIAGNOSIS — I10 Essential (primary) hypertension: Secondary | ICD-10-CM | POA: Diagnosis not present

## 2017-10-02 DIAGNOSIS — Z7982 Long term (current) use of aspirin: Secondary | ICD-10-CM | POA: Diagnosis not present

## 2017-10-02 DIAGNOSIS — Z87440 Personal history of urinary (tract) infections: Secondary | ICD-10-CM | POA: Diagnosis not present

## 2017-10-02 DIAGNOSIS — Z87891 Personal history of nicotine dependence: Secondary | ICD-10-CM | POA: Diagnosis not present

## 2017-10-02 DIAGNOSIS — F329 Major depressive disorder, single episode, unspecified: Secondary | ICD-10-CM | POA: Diagnosis not present

## 2017-10-02 DIAGNOSIS — I69320 Aphasia following cerebral infarction: Secondary | ICD-10-CM | POA: Diagnosis not present

## 2017-10-26 DIAGNOSIS — E785 Hyperlipidemia, unspecified: Secondary | ICD-10-CM | POA: Diagnosis not present

## 2017-10-26 DIAGNOSIS — I10 Essential (primary) hypertension: Secondary | ICD-10-CM | POA: Diagnosis not present

## 2017-10-26 DIAGNOSIS — E119 Type 2 diabetes mellitus without complications: Secondary | ICD-10-CM | POA: Diagnosis not present

## 2017-10-26 DIAGNOSIS — E1165 Type 2 diabetes mellitus with hyperglycemia: Secondary | ICD-10-CM | POA: Diagnosis not present

## 2017-10-26 DIAGNOSIS — E782 Mixed hyperlipidemia: Secondary | ICD-10-CM | POA: Diagnosis not present

## 2017-11-06 DIAGNOSIS — I69318 Other symptoms and signs involving cognitive functions following cerebral infarction: Secondary | ICD-10-CM | POA: Diagnosis not present

## 2017-11-06 DIAGNOSIS — Z6823 Body mass index (BMI) 23.0-23.9, adult: Secondary | ICD-10-CM | POA: Diagnosis not present

## 2017-11-06 DIAGNOSIS — Z024 Encounter for examination for driving license: Secondary | ICD-10-CM | POA: Diagnosis not present

## 2017-11-06 DIAGNOSIS — I639 Cerebral infarction, unspecified: Secondary | ICD-10-CM | POA: Diagnosis not present

## 2017-12-01 DIAGNOSIS — I10 Essential (primary) hypertension: Secondary | ICD-10-CM | POA: Diagnosis not present

## 2017-12-01 DIAGNOSIS — E782 Mixed hyperlipidemia: Secondary | ICD-10-CM | POA: Diagnosis not present

## 2017-12-01 DIAGNOSIS — E119 Type 2 diabetes mellitus without complications: Secondary | ICD-10-CM | POA: Diagnosis not present

## 2017-12-05 DIAGNOSIS — R944 Abnormal results of kidney function studies: Secondary | ICD-10-CM | POA: Diagnosis not present

## 2017-12-05 DIAGNOSIS — I10 Essential (primary) hypertension: Secondary | ICD-10-CM | POA: Diagnosis not present

## 2017-12-05 DIAGNOSIS — E785 Hyperlipidemia, unspecified: Secondary | ICD-10-CM | POA: Diagnosis not present

## 2017-12-05 DIAGNOSIS — Z6823 Body mass index (BMI) 23.0-23.9, adult: Secondary | ICD-10-CM | POA: Diagnosis not present

## 2017-12-05 DIAGNOSIS — Z8673 Personal history of transient ischemic attack (TIA), and cerebral infarction without residual deficits: Secondary | ICD-10-CM | POA: Diagnosis not present

## 2017-12-05 DIAGNOSIS — E1165 Type 2 diabetes mellitus with hyperglycemia: Secondary | ICD-10-CM | POA: Diagnosis not present

## 2017-12-19 DIAGNOSIS — E782 Mixed hyperlipidemia: Secondary | ICD-10-CM | POA: Diagnosis not present

## 2017-12-19 DIAGNOSIS — I10 Essential (primary) hypertension: Secondary | ICD-10-CM | POA: Diagnosis not present

## 2017-12-19 DIAGNOSIS — E119 Type 2 diabetes mellitus without complications: Secondary | ICD-10-CM | POA: Diagnosis not present

## 2017-12-19 DIAGNOSIS — E1165 Type 2 diabetes mellitus with hyperglycemia: Secondary | ICD-10-CM | POA: Diagnosis not present

## 2018-01-08 DIAGNOSIS — I10 Essential (primary) hypertension: Secondary | ICD-10-CM | POA: Diagnosis not present

## 2018-01-08 DIAGNOSIS — R944 Abnormal results of kidney function studies: Secondary | ICD-10-CM | POA: Diagnosis not present

## 2018-01-08 DIAGNOSIS — I639 Cerebral infarction, unspecified: Secondary | ICD-10-CM | POA: Diagnosis not present

## 2018-01-08 DIAGNOSIS — E1165 Type 2 diabetes mellitus with hyperglycemia: Secondary | ICD-10-CM | POA: Diagnosis not present

## 2018-01-08 DIAGNOSIS — E782 Mixed hyperlipidemia: Secondary | ICD-10-CM | POA: Diagnosis not present

## 2018-01-11 DIAGNOSIS — Z23 Encounter for immunization: Secondary | ICD-10-CM | POA: Diagnosis not present

## 2018-03-26 DIAGNOSIS — I10 Essential (primary) hypertension: Secondary | ICD-10-CM | POA: Diagnosis not present

## 2018-03-26 DIAGNOSIS — E1165 Type 2 diabetes mellitus with hyperglycemia: Secondary | ICD-10-CM | POA: Diagnosis not present

## 2018-03-26 DIAGNOSIS — E782 Mixed hyperlipidemia: Secondary | ICD-10-CM | POA: Diagnosis not present

## 2018-03-26 DIAGNOSIS — I639 Cerebral infarction, unspecified: Secondary | ICD-10-CM | POA: Diagnosis not present

## 2018-07-17 DIAGNOSIS — Z9181 History of falling: Secondary | ICD-10-CM | POA: Diagnosis not present

## 2018-07-17 DIAGNOSIS — M7981 Nontraumatic hematoma of soft tissue: Secondary | ICD-10-CM | POA: Diagnosis not present

## 2018-07-17 DIAGNOSIS — H00019 Hordeolum externum unspecified eye, unspecified eyelid: Secondary | ICD-10-CM | POA: Diagnosis not present

## 2018-07-17 DIAGNOSIS — E1140 Type 2 diabetes mellitus with diabetic neuropathy, unspecified: Secondary | ICD-10-CM | POA: Diagnosis not present

## 2018-07-17 DIAGNOSIS — M545 Low back pain: Secondary | ICD-10-CM | POA: Diagnosis not present

## 2018-07-17 DIAGNOSIS — R600 Localized edema: Secondary | ICD-10-CM | POA: Diagnosis not present

## 2018-07-17 DIAGNOSIS — Z0000 Encounter for general adult medical examination without abnormal findings: Secondary | ICD-10-CM | POA: Diagnosis not present

## 2018-07-17 DIAGNOSIS — M1990 Unspecified osteoarthritis, unspecified site: Secondary | ICD-10-CM | POA: Diagnosis not present

## 2018-07-17 DIAGNOSIS — I10 Essential (primary) hypertension: Secondary | ICD-10-CM | POA: Diagnosis not present

## 2018-07-17 DIAGNOSIS — K219 Gastro-esophageal reflux disease without esophagitis: Secondary | ICD-10-CM | POA: Diagnosis not present

## 2018-08-16 DIAGNOSIS — Z136 Encounter for screening for cardiovascular disorders: Secondary | ICD-10-CM | POA: Diagnosis not present

## 2018-08-16 DIAGNOSIS — I499 Cardiac arrhythmia, unspecified: Secondary | ICD-10-CM | POA: Diagnosis not present

## 2018-08-16 DIAGNOSIS — R42 Dizziness and giddiness: Secondary | ICD-10-CM | POA: Diagnosis not present

## 2018-08-23 ENCOUNTER — Telehealth: Payer: Self-pay | Admitting: Cardiology

## 2018-08-23 NOTE — Telephone Encounter (Signed)

## 2018-08-24 ENCOUNTER — Telehealth (INDEPENDENT_AMBULATORY_CARE_PROVIDER_SITE_OTHER): Payer: PPO | Admitting: Cardiology

## 2018-08-24 ENCOUNTER — Encounter: Payer: Self-pay | Admitting: Cardiology

## 2018-08-24 VITALS — Ht 71.0 in | Wt 170.0 lb

## 2018-08-24 DIAGNOSIS — R42 Dizziness and giddiness: Secondary | ICD-10-CM | POA: Diagnosis not present

## 2018-08-24 DIAGNOSIS — R5383 Other fatigue: Secondary | ICD-10-CM

## 2018-08-24 DIAGNOSIS — I493 Ventricular premature depolarization: Secondary | ICD-10-CM

## 2018-08-24 NOTE — Patient Instructions (Signed)
Your physician recommends that you schedule a follow-up appointment in: 2 MONTHS WITH DR BRANCH  Your physician recommends that you continue on your current medications as directed. Please refer to the Current Medication list given to you today.  Thank you for choosing [facility]!!    

## 2018-08-24 NOTE — Progress Notes (Signed)
Virtual Visit via Video Note   This visit type was conducted due to national recommendations for restrictions regarding the COVID-19 Pandemic (e.g. social distancing) in an effort to limit this patient's exposure and mitigate transmission in our community.  Due to his co-morbid illnesses, this patient is at least at moderate risk for complications without adequate follow up.  This format is felt to be most appropriate for this patient at this time.  All issues noted in this document were discussed and addressed.  A limited physical exam was performed with this format.  Please refer to the patient's chart for his consent to telehealth for [facility]   Date:  08/24/2018   ID:  Albert Garrison, DOB Feb 09, 1946, MRN [id number]  Patient Location: Home Provider Location: Home  PCP:  Celene Squibb, MD  Cardiologist:  Dr Carlyle Dolly Electrophysiologist:  None   Evaluation Performed:  New Patient Evaluation  Chief Complaint:  Dizziness  History of Present Illness:    Albert Garrison is a 73 y.o. male seen today as a new patient, referred by Dr Nevada Crane for dizziness.   1. Dizziness - patient is a difficult historian, difficult to collect a precise clear hitsory - reports symptoms since age of 19, intermittent dizzines and fatigue - had felt well up until his pcp appointment in mid May. On that day said he had not been resting or eating well. Felt drained and fatigue, dizzy. Denies any syncope. Since that time has started to feel more like his regular self. Dizziness has resolved, still some mild fatigue   - pcp EKG showed SR, occasoinal PVCs - of note PVCs, 8 beat run of NSVT noted during 08/2017 admissoin with CVA. His ventricular ectopy does not appear to be new    2. History of elevated troponin - admission 08/2017 with CVA, found to have elevated troponin - 08/2017 echo LVEF 55-60%, no WMAs - no ischemic workup was planned      The patient does not have symptoms concerning for  COVID-19 infection (fever, chills, cough, or new shortness of breath).    Past Medical History:  Diagnosis Date  . Arthritis   . BPH (benign prostatic hyperplasia)   . CKD (chronic kidney disease), stage II   . Complicated UTI (urinary tract infection) Dec.2015  . Depression    hx. of  . Diabetes mellitus without complication (HCC)    diet controlled  . Foley catheter in place 03/20/2014  . GERD (gastroesophageal reflux disease)    occasionally  . Heart murmur    hx. of at 17 yrs. old  . HOH (hard of hearing)   . Hypertension   . Lower GI bleed 2017   a. ? due to polyp.  . Pneumonia    hx. of  . Rheumatic fever    when 4 yrs. old  . Sleep apnea    does not use c-pap machine  . Sodium (Na) deficiency    managed in nursing center12/2015-Feb-2016   Past Surgical History:  Procedure Laterality Date  . CARPAL TUNNEL RELEASE Left 2010  . CARPAL TUNNEL RELEASE Right 07/13/2017   Procedure: CARPAL TUNNEL RELEASE;  Surgeon: Carole Civil, MD;  Location: [facility];  Service: Orthopedics;  Laterality: Right;  . CERVICAL SPINE SURGERY  2010  . COLONOSCOPY WITH PROPOFOL N/A 11/30/2015   Procedure: COLONOSCOPY WITH PROPOFOL;  Surgeon: Daneil Dolin, MD;  Location: [facility];  Service: Endoscopy;  Laterality: N/A;  . KNEE ARTHROSCOPY  1989  .  LUMBAR LAMINECTOMY  1986  . Nose surgery - broken nose    . POLYPECTOMY  11/30/2015   Procedure: POLYPECTOMY;  Surgeon: Daneil Dolin, MD;  Location: [facility];  Service: Endoscopy;;  polyp at ascending colon, rectal polyp  . TRANSURETHRAL INCISION OF PROSTATE N/A 01/20/2015   Procedure: TRANSURETHRAL INCISION OF THE PROSTATE (TUIP);  Surgeon: Irine Seal, MD;  Location: [facility];  Service: Urology;  Laterality: N/A;     No outpatient medications have been marked as taking for the 08/24/18 encounter (Appointment) with Arnoldo Lenis, MD.     Allergies:   Patient has no known allergies.   Social History   Tobacco Use  . Smoking  status: Former Smoker    Packs/day: 1.50    Years: 14.00    Pack years: 21.00    Types: Cigarettes    Last attempt to quit: 08/06/1975    Years since quitting: 43.0  . Smokeless tobacco: Never Used  Substance Use Topics  . Alcohol use: Yes    Alcohol/week: 3.0 standard drinks    Types: 3 Cans of beer per week    Comment: 3 12 oz cans of beer each week.None since 03/2014  . Drug use: No     Family Hx: The patient's family history is not on file.  ROS:   Please see the history of present illness.    All other systems reviewed and are negative.   Prior CV studies:   The following studies were reviewed today:  08/2017 echo Study Conclusions  - Left ventricle: Anbormal septal motion The cavity size was   normal. Wall thickness was increased in a pattern of moderate   LVH. Systolic function was normal. The estimated ejection   fraction was in the range of 55% to 60%. Wall motion was normal;   there were no regional wall motion abnormalities. Left   ventricular diastolic function parameters were normal. - Aortic valve: There was mild regurgitation. - Left atrium: The atrium was mildly dilated. - Atrial septum: No defect or patent foramen ovale was identified.   08/2017 carotid US IMPRESSION: 1. Mild (1-49%) stenosis proximal left internal carotid artery secondary to heterogenous atherosclerotic plaque. 2. No significant atherosclerotic plaque or stenosis in the right internal carotid artery. 3. The bilateral vertebral arteries are patent with normal antegrade flow.  Labs/Other Tests and Data Reviewed:    EKG: EKG from pcp shows SR, occasoinal PVCs  Recent Labs: No results found for requested labs within last 8760 hours.   Recent Lipid Panel Lab Results  Component Value Date/Time   CHOL 169 08/23/2017 01:13 AM   TRIG 118 08/23/2017 01:13 AM   HDL 37 (L) 08/23/2017 01:13 AM   CHOLHDL 4.6 08/23/2017 01:13 AM   LDLCALC 108 (H) 08/23/2017 01:13 AM    Wt Readings  from Last 3 Encounters:  09/13/17 183 lb (83 kg)  08/23/17 183 lb 3.2 oz (83.1 kg)  08/14/17 188 lb (85.3 kg)     Objective:    Vital Signs:   Today's Vitals   08/24/18 1029  Weight: 170 lb (77.1 kg)  Height: 5\' 11"  (1.803 m)   Body mass index is 23.71 kg/m.  Normal affect. Normal speech pattern and tone. Comfortable, no apparent distress. No audible signs of SOB or wheezing. Alert and oriented  ASSESSMENT & PLAN:    1. Dizziness/fatigue/PVCs - difficult historian, overall describes single day of nonspecific fatigue and dizziness. Symptoms have resolved since that time. He reports not eating or  sleeping well around that time - his ventricular ectopy is not new and was noted during his 08/2017 admission. Benign echo at that time.  His nonspecific symptoms are not overally conistent with a symptomatic arrhythmia - continue to monitor at this time. If recurrent more consistent clear dizziness or presyncope would consider a home monitor at that time      COVID-19 Education: The signs and symptoms of COVID-19 were discussed with the patient and how to seek care for testing (follow up with PCP or arrange E-visit).  The importance of social distancing was discussed today.  Time:   Today, I have spent 19 minutes with the patient with telehealth technology discussing the above problems.     Medication Adjustments/Labs and Tests Ordered: Current medicines are reviewed at length with the patient today.  Concerns regarding medicines are outlined above.   Tests Ordered: No orders of the defined types were placed in this encounter.   Medication Changes: No orders of the defined types were placed in this encounter.   Disposition:  Follow up 2 months  Signed, Carlyle Dolly, MD  08/24/2018 10:29 AM    [place]

## 2018-08-28 ENCOUNTER — Emergency Department (HOSPITAL_COMMUNITY)
Admission: EM | Admit: 2018-08-28 | Discharge: 2018-08-28 | Disposition: A | Discharge status: Home / Self Care | Payer: PPO | Attending: Emergency Medicine | Admitting: Emergency Medicine

## 2018-08-28 ENCOUNTER — Other Ambulatory Visit: Payer: Self-pay

## 2018-08-28 ENCOUNTER — Encounter (HOSPITAL_COMMUNITY): Payer: Self-pay | Admitting: *Deleted

## 2018-08-28 ENCOUNTER — Emergency Department (HOSPITAL_COMMUNITY): Payer: PPO

## 2018-08-28 DIAGNOSIS — R531 Weakness: Secondary | ICD-10-CM | POA: Diagnosis not present

## 2018-08-28 DIAGNOSIS — R42 Dizziness and giddiness: Secondary | ICD-10-CM | POA: Diagnosis not present

## 2018-08-28 DIAGNOSIS — Z87891 Personal history of nicotine dependence: Secondary | ICD-10-CM | POA: Diagnosis not present

## 2018-08-28 DIAGNOSIS — R079 Chest pain, unspecified: Secondary | ICD-10-CM | POA: Insufficient documentation

## 2018-08-28 DIAGNOSIS — N182 Chronic kidney disease, stage 2 (mild): Secondary | ICD-10-CM | POA: Insufficient documentation

## 2018-08-28 DIAGNOSIS — H539 Unspecified visual disturbance: Secondary | ICD-10-CM | POA: Insufficient documentation

## 2018-08-28 DIAGNOSIS — E119 Type 2 diabetes mellitus without complications: Secondary | ICD-10-CM | POA: Diagnosis not present

## 2018-08-28 DIAGNOSIS — R55 Syncope and collapse: Secondary | ICD-10-CM | POA: Diagnosis present

## 2018-08-28 DIAGNOSIS — Z1159 Encounter for screening for other viral diseases: Secondary | ICD-10-CM | POA: Insufficient documentation

## 2018-08-28 DIAGNOSIS — I129 Hypertensive chronic kidney disease with stage 1 through stage 4 chronic kidney disease, or unspecified chronic kidney disease: Secondary | ICD-10-CM | POA: Diagnosis not present

## 2018-08-28 DIAGNOSIS — R0789 Other chest pain: Secondary | ICD-10-CM | POA: Diagnosis not present

## 2018-08-28 DIAGNOSIS — R001 Bradycardia, unspecified: Secondary | ICD-10-CM | POA: Diagnosis not present

## 2018-08-28 DIAGNOSIS — E860 Dehydration: Secondary | ICD-10-CM | POA: Diagnosis not present

## 2018-08-28 LAB — CBC
HCT: 48.3 % (ref 39.0–52.0)
Hemoglobin: 16.6 g/dL (ref 13.0–17.0)
MCH: 31.1 pg (ref 26.0–34.0)
MCHC: 34.4 g/dL (ref 30.0–36.0)
MCV: 90.6 fL (ref 80.0–100.0)
Platelets: 309 10*3/uL (ref 150–400)
RBC: 5.33 MIL/uL (ref 4.22–5.81)
RDW: 12.7 % (ref 11.5–15.5)
WBC: 8.7 10*3/uL (ref 4.0–10.5)
nRBC: 0 % (ref 0.0–0.2)

## 2018-08-28 LAB — COMPREHENSIVE METABOLIC PANEL
ALT: 20 U/L (ref 0–44)
AST: 20 U/L (ref 15–41)
Albumin: 4.6 g/dL (ref 3.5–5.0)
Alkaline Phosphatase: 56 U/L (ref 38–126)
Anion gap: 10 (ref 5–15)
BUN: 27 mg/dL — ABNORMAL HIGH (ref 8–23)
CO2: 27 mmol/L (ref 22–32)
Calcium: 9.8 mg/dL (ref 8.9–10.3)
Chloride: 97 mmol/L — ABNORMAL LOW (ref 98–111)
Creatinine, Ser: 1.85 mg/dL — ABNORMAL HIGH (ref 0.61–1.24)
GFR calc Af Amer: 41 mL/min — ABNORMAL LOW (ref >60)
GFR calc non Af Amer: 35 mL/min — ABNORMAL LOW (ref >60)
Glucose, Bld: 105 mg/dL — ABNORMAL HIGH (ref 70–99)
Potassium: 4.3 mmol/L (ref 3.5–5.1)
Sodium: 134 mmol/L — ABNORMAL LOW (ref 135–145)
Total Bilirubin: 1.3 mg/dL — ABNORMAL HIGH (ref 0.3–1.2)
Total Protein: 7.9 g/dL (ref 6.5–8.1)

## 2018-08-28 LAB — SARS CORONAVIRUS 2 BY RT PCR (HOSPITAL ORDER, PERFORMED IN ~~LOC~~ HOSPITAL LAB): SARS Coronavirus 2: NEGATIVE

## 2018-08-28 LAB — TROPONIN I
Troponin I: <0.03 ng/mL (ref <0.03)
Troponin I: <0.03 ng/mL (ref <0.03)

## 2018-08-28 LAB — D-DIMER, QUANTITATIVE: D-Dimer, Quant: 0.38 ug/mL-FEU (ref 0.00–0.50)

## 2018-08-28 LAB — CBG MONITORING, ED: Glucose-Capillary: 127 mg/dL — ABNORMAL HIGH (ref 70–99)

## 2018-08-28 MED ORDER — SODIUM CHLORIDE 0.9 % IV BOLUS
1000.0000 mL | Freq: Once | INTRAVENOUS | Status: AC
  Administered 2018-08-28: 1000 mL via INTRAVENOUS

## 2018-08-28 MED ORDER — ASPIRIN 81 MG PO CHEW
324.0000 mg | CHEWABLE_TABLET | Freq: Once | ORAL | Status: AC
  Administered 2018-08-28: 324 mg via ORAL
  Filled 2018-08-28: qty 4

## 2018-08-28 MED ORDER — OXYCODONE HCL 5 MG PO TABS
5.0000 mg | ORAL_TABLET | Freq: Once | ORAL | Status: AC
  Administered 2018-08-28: 5 mg via ORAL
  Filled 2018-08-28: qty 1

## 2018-08-28 NOTE — Discharge Instructions (Addendum)
You were evaluated in the Emergency Department and after careful evaluation, we did not find any emergent condition requiring admission or further testing in the hospital.  Your symptoms today seem to be due to dehydration.  Please drink more water at home.  Your testing was very reassuring and did not show any heart damage or other emergencies.  It is important that you see Dr. Posey Pronto in his eye clinic tomorrow at 9 AM.  Please go to the address provided.  Please return to the Emergency Department if you experience any worsening of your condition.  We encourage you to follow up with a primary care provider.  Thank you for allowing [place] to be a part of your care.

## 2018-08-28 NOTE — ED Triage Notes (Signed)
Dr. Sedonia Small notified of pt's symptoms. MD at bedside.

## 2018-08-28 NOTE — ED Triage Notes (Signed)
Pt reports last night his vision in his left eye started to get blurry around 2100. This morning pt was at the gas station and when he stood up he started to feel like he was going to pass out. Pt reports the episode lasted a few seconds. Pt then drove himself to Dr. Juel Burrow office because of the near syncope episode. Dr. Juel Burrow office did EKG and found pt's HR to be in the 30's and sent pt to ED for further evaluation.

## 2018-08-28 NOTE — ED Provider Notes (Signed)
[facility] Emergency Department Provider Note MRN:  [id number]  Arrival date & time: 08/28/18     Chief Complaint   Near Syncope   History of Present Illness   Albert Garrison is a 73 y.o. year-old male with a history of CKD, diabetes, hypertension presenting to the ED with chief complaint of near syncope.  Patient explains that last night he noticed sudden blurriness to vision in his right eye.  Went to bed, woke up this morning, vision issues in the right eye were still present.  Later in the day, when standing from a seated position, he became very lightheaded and nearly passed out.  He is also endorsing chest pain in the center of the chest that is worse with deep breathing today.  Pain described as mild.  He denies headache, no nausea or vomiting, no diaphoresis, no fever, no cough, no shortness of breath, no abdominal pain, no numbness or weakness to the arms or legs, no leg pain or swelling.  Patient does endorse seeing floaters in his right eye last night.  Denies brighter flashing lights in his peripheral vision.  Denies eye pain or trauma.  Review of Systems  A complete 10 system review of systems was obtained and all systems are negative except as noted in the HPI and PMH.   Patient's Health History    Past Medical History:  Diagnosis Date  . Arthritis   . BPH (benign prostatic hyperplasia)   . CKD (chronic kidney disease), stage II   . Complicated UTI (urinary tract infection) Dec.2015  . Depression    hx. of  . Diabetes mellitus without complication (HCC)    diet controlled  . Foley catheter in place 03/20/2014  . GERD (gastroesophageal reflux disease)    occasionally  . Heart murmur    hx. of at 17 yrs. old  . HOH (hard of hearing)   . Hypertension   . Lower GI bleed 2017   a. ? due to polyp.  . Pneumonia    hx. of  . Rheumatic fever    when 4 yrs. old  . Sleep apnea    does not use c-pap machine  . Sodium (Na) deficiency    managed in  nursing center12/2015-Feb-2016    Past Surgical History:  Procedure Laterality Date  . CARPAL TUNNEL RELEASE Left 2010  . CARPAL TUNNEL RELEASE Right 07/13/2017   Procedure: CARPAL TUNNEL RELEASE;  Surgeon: Carole Civil, MD;  Location: [facility];  Service: Orthopedics;  Laterality: Right;  . CERVICAL SPINE SURGERY  2010  . COLONOSCOPY WITH PROPOFOL N/A 11/30/2015   Procedure: COLONOSCOPY WITH PROPOFOL;  Surgeon: Daneil Dolin, MD;  Location: [facility];  Service: Endoscopy;  Laterality: N/A;  . KNEE ARTHROSCOPY  1989  . LUMBAR LAMINECTOMY  1986  . Nose surgery - broken nose    . POLYPECTOMY  11/30/2015   Procedure: POLYPECTOMY;  Surgeon: Daneil Dolin, MD;  Location: [facility];  Service: Endoscopy;;  polyp at ascending colon, rectal polyp  . TRANSURETHRAL INCISION OF PROSTATE N/A 01/20/2015   Procedure: TRANSURETHRAL INCISION OF THE PROSTATE (TUIP);  Surgeon: Irine Seal, MD;  Location: [facility];  Service: Urology;  Laterality: N/A;    No family history on file.  Social History   Socioeconomic History  . Marital status: Divorced    Spouse name: Not on file  . Number of children: Not on file  . Years of education: Not on file  . Highest  education level: Not on file  Occupational History  . Not on file  Social Needs  . Financial resource strain: Not on file  . Food insecurity:    Worry: Not on file    Inability: Not on file  . Transportation needs:    Medical: Not on file    Non-medical: Not on file  Tobacco Use  . Smoking status: Former Smoker    Packs/day: 1.50    Years: 14.00    Pack years: 21.00    Types: Cigarettes    Last attempt to quit: 08/06/1975    Years since quitting: 43.0  . Smokeless tobacco: Never Used  Substance and Sexual Activity  . Alcohol use: Yes    Alcohol/week: 3.0 standard drinks    Types: 3 Cans of beer per week    Comment: 3 12 oz cans of beer each week.None since 03/2014  . Drug use: No  . Sexual activity: Yes    Birth  control/protection: None  Lifestyle  . Physical activity:    Days per week: Not on file    Minutes per session: Not on file  . Stress: Not on file  Relationships  . Social connections:    Talks on phone: Not on file    Gets together: Not on file    Attends religious service: Not on file    Active member of club or organization: Not on file    Attends meetings of clubs or organizations: Not on file    Relationship status: Not on file  . Intimate partner violence:    Fear of current or ex partner: Not on file    Emotionally abused: Not on file    Physically abused: Not on file    Forced sexual activity: Not on file  Other Topics Concern  . Not on file  Social History Narrative  . Not on file     Physical Exam  Vital Signs and Nursing Notes reviewed Vitals:   08/28/18 1300 08/28/18 1330  BP: (!) 159/79 (!) 147/64  Pulse: (!) 58 (!) 52  Resp: 18 15  Temp:    SpO2: 98% 95%    CONSTITUTIONAL: Well-appearing, NAD, hard of hearing NEURO:  Alert and oriented x 3, normal and symmetric strength and sensation, normal coordination, no aphasia, no dysarthria, no neglect EYES:  eyes equal and reactive, normal extraocular movements, no APD, decreased visual acuity to the right inferior visual field ENT/NECK:  no LAD, no JVD CARDIO: Regular rate, well-perfused, normal S1 and S2 PULM:  CTAB no wheezing or rhonchi GI/GU:  normal bowel sounds, non-distended, non-tender MSK/SPINE:  No gross deformities, no edema SKIN:  no rash, atraumatic PSYCH:  Appropriate speech and behavior  Diagnostic and Interventional Summary    EKG Interpretation  Date/Time:  Tuesday Aug 28 2018 12:01:43 EDT Ventricular Rate:  55 PR Interval:    QRS Duration: 104 QT Interval:  455 QTC Calculation: 436 R Axis:   -5 Text Interpretation:  Sinus rhythm Confirmed by Gerlene Fee [phone]) on 08/28/2018 12:18:38 PM      Labs Reviewed  COMPREHENSIVE METABOLIC PANEL - Abnormal; Notable for the following  components:      Result Value   Sodium 134 (*)    Chloride 97 (*)    Glucose, Bld 105 (*)    BUN 27 (*)    Creatinine, Ser 1.85 (*)    Total Bilirubin 1.3 (*)    GFR calc non Af Amer 35 (*)    GFR calc Af  Amer 41 (*)    All other components within normal limits  CBG MONITORING, ED - Abnormal; Notable for the following components:   Glucose-Capillary 127 (*)    All other components within normal limits  SARS CORONAVIRUS 2 (HOSPITAL ORDER, [place] [facility])  CBC  TROPONIN I  D-DIMER, QUANTITATIVE (NOT AT [facility])  TROPONIN I    DG Chest Port 1 View  Final Result      Medications  aspirin chewable tablet 324 mg (324 mg Oral Given 08/28/18 1224)  oxyCODONE (Oxy IR/ROXICODONE) immediate release tablet 5 mg (5 mg Oral Given 08/28/18 1223)  sodium chloride 0.9 % bolus 1,000 mL (0 mLs Intravenous Stopped 08/28/18 1354)     Procedures EMERGENCY DEPARTMENT [place] OCULAR EXAM "Study: Limited Ultrasound of Orbit "  INDICATIONS: Vision loss and Floaters/Flashes Linear probe utilized to obtain images in both long and short axis of the orbit having the patient look left and right if possible.  PERFORMED BY: Myself IMAGES ARCHIVED?: Yes LIMITATIONS: none VIEWS USED: Right orbit INTERPRETATION: No retinal detachment, Lens in proper position, Normal optic nerve diameter   Critical Care  ED Course and Medical Decision Making  I have reviewed the triage vital signs and the nursing notes.  Pertinent labs & imaging results that were available during my care of the patient were reviewed by me and considered in my medical decision making (see below for details).  Question of retinal detachment versus vitreous hemorrhage in this 73 year old male with vision change to the right eye, seems to have near complete loss of vision in the inferior visual field on the right.  Patient has 20/25 vision on the left, 20/40 vision on the right.  Bedside ultrasound described above, no obvious  retinal abnormality.  Will discuss with ophthalmology.  Patient is also experiencing pleuritic chest pain today, atypical but will screen for ACS with EKG and troponin.  Near syncope today seems positional and more related to orthostatics, but in combination with pleuritic chest pain will screen with d-dimer.  D-dimer is negative.  Patient's chest pain is resolved.  Troponin is negative x2.  Patient feeling much better after IV fluids.  Suspect orthostatic hypotension as the cause of patient's near syncope.  Patient has no other documented evidence of significant structural heart disease that would qualify this as high risk syncope.  He is appropriate for discharge with close PCP follow-up.  I discussed his visual symptoms with Dr. Posey Pronto of ophthalmology, who will be happy to see him in clinic tomorrow at 9 AM.  After the discussed management above, the patient was determined to be safe for discharge.  The patient was in agreement with this plan and all questions regarding their care were answered.  ED return precautions were discussed and the patient will return to the ED with any significant worsening of condition.  Barth Kirks. Sedonia Small, MD [place] [email or web address]  Final Clinical Impressions(s) / ED Diagnoses     ICD-10-CM   1. Dehydration E86.0   2. Chest pain R07.9 DG Chest [facility] 1 View    DG Chest Port 1 View  3. Lightheadedness R42   4. Vision changes H53.9     ED Discharge Orders    None         Maudie Flakes, MD 08/28/18 [phone]

## 2018-08-29 DIAGNOSIS — H35033 Hypertensive retinopathy, bilateral: Secondary | ICD-10-CM | POA: Diagnosis not present

## 2018-08-29 DIAGNOSIS — H53131 Sudden visual loss, right eye: Secondary | ICD-10-CM | POA: Diagnosis not present

## 2018-08-29 DIAGNOSIS — H53451 Other localized visual field defect, right eye: Secondary | ICD-10-CM | POA: Diagnosis not present

## 2018-08-29 DIAGNOSIS — H47091 Other disorders of optic nerve, not elsewhere classified, right eye: Secondary | ICD-10-CM | POA: Diagnosis not present

## 2018-08-29 DIAGNOSIS — H25813 Combined forms of age-related cataract, bilateral: Secondary | ICD-10-CM | POA: Diagnosis not present

## 2018-08-29 DIAGNOSIS — H5319 Other subjective visual disturbances: Secondary | ICD-10-CM | POA: Diagnosis not present

## 2018-08-30 DIAGNOSIS — E1169 Type 2 diabetes mellitus with other specified complication: Secondary | ICD-10-CM | POA: Diagnosis not present

## 2018-08-30 DIAGNOSIS — I499 Cardiac arrhythmia, unspecified: Secondary | ICD-10-CM | POA: Diagnosis not present

## 2018-08-30 DIAGNOSIS — E782 Mixed hyperlipidemia: Secondary | ICD-10-CM | POA: Diagnosis not present

## 2018-08-30 DIAGNOSIS — I10 Essential (primary) hypertension: Secondary | ICD-10-CM | POA: Diagnosis not present

## 2018-08-30 DIAGNOSIS — R42 Dizziness and giddiness: Secondary | ICD-10-CM | POA: Diagnosis not present

## 2018-09-03 ENCOUNTER — Encounter (HOSPITAL_COMMUNITY): Payer: Self-pay | Admitting: Emergency Medicine

## 2018-09-03 ENCOUNTER — Emergency Department (HOSPITAL_COMMUNITY)
Admission: EM | Admit: 2018-09-03 | Discharge: 2018-09-03 | Disposition: A | Discharge status: Home / Self Care | Payer: PPO | Attending: Emergency Medicine | Admitting: Emergency Medicine

## 2018-09-03 ENCOUNTER — Other Ambulatory Visit: Payer: Self-pay

## 2018-09-03 DIAGNOSIS — H5789 Other specified disorders of eye and adnexa: Secondary | ICD-10-CM | POA: Diagnosis present

## 2018-09-03 DIAGNOSIS — N182 Chronic kidney disease, stage 2 (mild): Secondary | ICD-10-CM | POA: Diagnosis not present

## 2018-09-03 DIAGNOSIS — E1122 Type 2 diabetes mellitus with diabetic chronic kidney disease: Secondary | ICD-10-CM | POA: Diagnosis not present

## 2018-09-03 DIAGNOSIS — J3489 Other specified disorders of nose and nasal sinuses: Secondary | ICD-10-CM | POA: Diagnosis not present

## 2018-09-03 DIAGNOSIS — I129 Hypertensive chronic kidney disease with stage 1 through stage 4 chronic kidney disease, or unspecified chronic kidney disease: Secondary | ICD-10-CM | POA: Diagnosis not present

## 2018-09-03 DIAGNOSIS — Z87891 Personal history of nicotine dependence: Secondary | ICD-10-CM | POA: Insufficient documentation

## 2018-09-03 DIAGNOSIS — H04209 Unspecified epiphora, unspecified lacrimal gland: Secondary | ICD-10-CM | POA: Insufficient documentation

## 2018-09-03 DIAGNOSIS — R05 Cough: Secondary | ICD-10-CM | POA: Diagnosis not present

## 2018-09-03 NOTE — ED Provider Notes (Signed)
[facility] Emergency Department Provider Note MRN:  [id number]  Arrival date & time: 09/03/18     Chief Complaint   Watery eyes History of Present Illness   Albert Garrison is a 73 y.o. year-old male with a history of CKD, diabetes presenting to the ED with chief complaint of watery eyes.  2 hours prior to arrival, patient began noticing some watery eyes and nasal congestion.  He then remembered that he coughed earlier in the day a few times.  He denies fever, no nausea or vomiting, no headache or vision change, no chest pain or shortness of breath, no abdominal pain, no numbness or weakness to the arms or legs.  He is concerned about coronavirus and wants to be tested.  Review of Systems  A complete 10 system review of systems was obtained and all systems are negative except as noted in the HPI and PMH.   Patient's Health History    Past Medical History:  Diagnosis Date  . Arthritis   . BPH (benign prostatic hyperplasia)   . CKD (chronic kidney disease), stage II   . Complicated UTI (urinary tract infection) Dec.2015  . Depression    hx. of  . Diabetes mellitus without complication (HCC)    diet controlled  . Foley catheter in place 03/20/2014  . GERD (gastroesophageal reflux disease)    occasionally  . Heart murmur    hx. of at 17 yrs. old  . HOH (hard of hearing)   . Hypertension   . Lower GI bleed 2017   a. ? due to polyp.  . Pneumonia    hx. of  . Rheumatic fever    when 4 yrs. old  . Sleep apnea    does not use c-pap machine  . Sodium (Na) deficiency    managed in nursing center12/2015-Feb-2016    Past Surgical History:  Procedure Laterality Date  . CARPAL TUNNEL RELEASE Left 2010  . CARPAL TUNNEL RELEASE Right 07/13/2017   Procedure: CARPAL TUNNEL RELEASE;  Surgeon: Carole Civil, MD;  Location: [facility];  Service: Orthopedics;  Laterality: Right;  . CERVICAL SPINE SURGERY  2010  . COLONOSCOPY WITH PROPOFOL N/A 11/30/2015   Procedure:  COLONOSCOPY WITH PROPOFOL;  Surgeon: Daneil Dolin, MD;  [facility];  Service: Endoscopy;  Laterality: N/A;  . KNEE ARTHROSCOPY  1989  . LUMBAR LAMINECTOMY  1986  . Nose surgery - broken nose    . POLYPECTOMY  11/30/2015   Procedure: POLYPECTOMY;  Surgeon: Daneil Dolin, MD;  [facility];  Service: Endoscopy;;  polyp at ascending colon, rectal polyp  . TRANSURETHRAL INCISION OF PROSTATE N/A 01/20/2015   Procedure: TRANSURETHRAL INCISION OF THE PROSTATE (TUIP);  Surgeon: Irine Seal, MD;  Location: [facility];  Service: Urology;  Laterality: N/A;    No family history on file.  Social History   Socioeconomic History  . Marital status: Divorced    Spouse name: Not on file  . Number of children: Not on file  . Years of education: Not on file  . Highest education level: Not on file  Occupational History  . Not on file  Social Needs  . Financial resource strain: Not on file  . Food insecurity:    Worry: Not on file    Inability: Not on file  . Transportation needs:    Medical: Not on file    Non-medical: Not on file  Tobacco Use  . Smoking status: Former Smoker  Packs/day: 1.50    Years: 14.00    Pack years: 21.00    Types: Cigarettes    Last attempt to quit: 08/06/1975    Years since quitting: 43.1  . Smokeless tobacco: Never Used  Substance and Sexual Activity  . Alcohol use: Yes    Alcohol/week: 3.0 standard drinks    Types: 3 Cans of beer per week    Comment: 3 12 oz cans of beer each week.None since 03/2014  . Drug use: No  . Sexual activity: Yes    Birth control/protection: None  Lifestyle  . Physical activity:    Days per week: Not on file    Minutes per session: Not on file  . Stress: Not on file  Relationships  . Social connections:    Talks on phone: Not on file    Gets together: Not on file    Attends religious service: Not on file    Active member of club or organization: Not on file    Attends meetings of clubs or organizations: Not  on file    Relationship status: Not on file  . Intimate partner violence:    Fear of current or ex partner: Not on file    Emotionally abused: Not on file    Physically abused: Not on file    Forced sexual activity: Not on file  Other Topics Concern  . Not on file  Social History Narrative  . Not on file     Physical Exam  Vital Signs and Nursing Notes reviewed Vitals:   09/03/18 2038  BP: (!) 159/72  Pulse: 61  Resp: 20  Temp: 97.8 F (36.6 C)  SpO2: 94%    CONSTITUTIONAL: Well-appearing, NAD NEURO:  Alert and oriented x 3, normal and symmetric strength and sensation, normal coronation EYES:  eyes equal and reactive, normal extraocular movements, peripheral vision intact ENT/NECK:  no LAD, no JVD CARDIO: Regular rate, well-perfused, normal S1 and S2 PULM:  CTAB no wheezing or rhonchi GI/GU:  normal bowel sounds, non-distended, non-tender MSK/SPINE:  No gross deformities, no edema SKIN:  no rash, atraumatic PSYCH:  Appropriate speech and behavior  Diagnostic and Interventional Summary    Labs Reviewed - No data to display  No orders to display    Medications - No data to display   Procedures Critical Care  ED Course and Medical Decision Making  I have reviewed the triage vital signs and the nursing notes.  Pertinent labs & imaging results that were available during my care of the patient were reviewed by me and considered in my medical decision making (see below for details).  Mild symptoms consistent with allergies or viral infection, patient has a recent negative coronavirus test on record, no recent sick contacts, no fever, no cough, unnecessary to test again today.  Provided reassurance, advised quarantine at home for the next 1 to 2 weeks.  After the discussed management above, the patient was determined to be safe for discharge.  The patient was in agreement with this plan and all questions regarding their care were answered.  ED return precautions were  discussed and the patient will return to the ED with any significant worsening of condition.  Barth Kirks. Sedonia Small, [place] [email or web address]  Final Clinical Impressions(s) / ED Diagnoses     ICD-10-CM   1. Nasal discharge with watery eyes J34.89    H04.209     ED Discharge Orders    None  Maudie Flakes, MD 09/03/18 2124

## 2018-09-03 NOTE — Discharge Instructions (Addendum)
You were evaluated in the Emergency Department and after careful evaluation, we did not find any emergent condition requiring admission or further testing in the hospital.  Your symptoms today seem to be due to allergies or a mild viral infection.  Please return to the Emergency Department if you experience any worsening of your condition.  We encourage you to follow up with a primary care provider.  Thank you for allowing [place] to be a part of your care.

## 2018-09-03 NOTE — ED Triage Notes (Signed)
Pt here because he wants to be tested for "the [facility]". States that 2 hrs ago his eyes started watering and that he has a slight headache between his eye and that he took some kind of sinus medication for it. Denies any other symptoms.

## 2018-09-06 DIAGNOSIS — I10 Essential (primary) hypertension: Secondary | ICD-10-CM | POA: Diagnosis not present

## 2018-09-06 DIAGNOSIS — I639 Cerebral infarction, unspecified: Secondary | ICD-10-CM | POA: Diagnosis not present

## 2018-09-06 DIAGNOSIS — E1169 Type 2 diabetes mellitus with other specified complication: Secondary | ICD-10-CM | POA: Diagnosis not present

## 2018-09-06 DIAGNOSIS — E782 Mixed hyperlipidemia: Secondary | ICD-10-CM | POA: Diagnosis not present

## 2018-09-06 DIAGNOSIS — E785 Hyperlipidemia, unspecified: Secondary | ICD-10-CM | POA: Diagnosis not present

## 2018-09-06 DIAGNOSIS — E1165 Type 2 diabetes mellitus with hyperglycemia: Secondary | ICD-10-CM | POA: Diagnosis not present

## 2018-09-06 DIAGNOSIS — R944 Abnormal results of kidney function studies: Secondary | ICD-10-CM | POA: Diagnosis not present

## 2018-09-13 DIAGNOSIS — R944 Abnormal results of kidney function studies: Secondary | ICD-10-CM | POA: Diagnosis not present

## 2018-09-13 DIAGNOSIS — Z8673 Personal history of transient ischemic attack (TIA), and cerebral infarction without residual deficits: Secondary | ICD-10-CM | POA: Diagnosis not present

## 2018-09-13 DIAGNOSIS — I10 Essential (primary) hypertension: Secondary | ICD-10-CM | POA: Diagnosis not present

## 2018-09-13 DIAGNOSIS — E1169 Type 2 diabetes mellitus with other specified complication: Secondary | ICD-10-CM | POA: Diagnosis not present

## 2018-09-13 DIAGNOSIS — E782 Mixed hyperlipidemia: Secondary | ICD-10-CM | POA: Diagnosis not present

## 2018-09-14 DIAGNOSIS — H40033 Anatomical narrow angle, bilateral: Secondary | ICD-10-CM | POA: Diagnosis not present

## 2018-09-14 DIAGNOSIS — H2513 Age-related nuclear cataract, bilateral: Secondary | ICD-10-CM | POA: Diagnosis not present

## 2018-10-09 DIAGNOSIS — Z0000 Encounter for general adult medical examination without abnormal findings: Secondary | ICD-10-CM | POA: Diagnosis not present

## 2018-11-07 ENCOUNTER — Encounter: Payer: Self-pay | Admitting: Internal Medicine

## 2018-11-08 ENCOUNTER — Encounter: Payer: Self-pay | Admitting: Cardiology

## 2018-11-08 ENCOUNTER — Telehealth (INDEPENDENT_AMBULATORY_CARE_PROVIDER_SITE_OTHER): Payer: PPO | Admitting: Cardiology

## 2018-11-08 ENCOUNTER — Other Ambulatory Visit: Payer: Self-pay

## 2018-11-08 VITALS — Ht 71.5 in | Wt 170.0 lb

## 2018-11-08 DIAGNOSIS — I493 Ventricular premature depolarization: Secondary | ICD-10-CM

## 2018-11-08 DIAGNOSIS — R42 Dizziness and giddiness: Secondary | ICD-10-CM

## 2018-11-08 NOTE — Progress Notes (Signed)
Virtual Visit via Telephone Note   This visit type was conducted due to national recommendations for restrictions regarding the COVID-19 Pandemic (e.g. social distancing) in an effort to limit this patient's exposure and mitigate transmission in our community.  Due to his co-morbid illnesses, this patient is at least at moderate risk for complications without adequate follow up.  This format is felt to be most appropriate for this patient at this time.  The patient did not have access to video technology/had technical difficulties with video requiring transitioning to audio format only (telephone).  All issues noted in this document were discussed and addressed.  No physical exam could be performed with this format.  Please refer to the patient's chart for his  consent to telehealth for [facility].   Date:  11/08/2018   ID:  Albert Garrison, DOB April 04, 1946, MRN [id number]  Patient Location: Home Provider Location: Office  PCP:  Celene Squibb, MD  Cardiologist:  Dr Carlyle Dolly MD Electrophysiologist:  None   Evaluation Performed:  Follow-Up Visit  Chief Complaint:  Dizziness  History of Present Illness:    Albert Garrison is a 73 y.o. male seen today as a new patient, referred by Dr Nevada Crane for dizziness.   1. Dizziness - patient is a difficult historian, difficult to collect a precise clear hitsory - reports symptoms since age of 25, intermittent dizzines and fatigue - had felt well up until his pcp appointment in mid May. On that day said he had not been resting or eating well. Felt drained and fatigue, dizzy. Denies any syncope. Since that time has started to feel more like his regular self. Dizziness has resolved, still some mild fatigue   - pcp EKG showed SR, occasoinal PVCs - of note PVCs, 8 beat run of NSVT noted during 08/2017 admissoin with CVA. His ventricular ectopy does not appear to be new  -no recent dizziness - no recent palpitations.  - no chest pain, no  SOB/DOE - walks up to 1/2 mild a day without troubles.   2. History of elevated troponin - admission 08/2017 with CVA, found to have elevated troponin - 08/2017 echo LVEF 55-60%, no WMAs - no ischemic workup was planned    The patient does not have symptoms concerning for COVID-19 infection (fever, chills, cough, or new shortness of breath).    Past Medical History:  Diagnosis Date  . Arthritis   . BPH (benign prostatic hyperplasia)   . CKD (chronic kidney disease), stage II   . Complicated UTI (urinary tract infection) Dec.2015  . Depression    hx. of  . Diabetes mellitus without complication (HCC)    diet controlled  . Foley catheter in place 03/20/2014  . GERD (gastroesophageal reflux disease)    occasionally  . Heart murmur    hx. of at 17 yrs. old  . HOH (hard of hearing)   . Hypertension   . Lower GI bleed 2017   a. ? due to polyp.  . Pneumonia    hx. of  . Rheumatic fever    when 4 yrs. old  . Sleep apnea    does not use c-pap machine  . Sodium (Na) deficiency    managed in nursing center12/2015-Feb-2016   Past Surgical History:  Procedure Laterality Date  . CARPAL TUNNEL RELEASE Left 2010  . CARPAL TUNNEL RELEASE Right 07/13/2017   Procedure: CARPAL TUNNEL RELEASE;  Surgeon: Carole Civil, MD;  Location: [facility];  Service: Orthopedics;  Laterality:  Right;  . CERVICAL SPINE SURGERY  2010  . COLONOSCOPY WITH PROPOFOL N/A 11/30/2015   Procedure: COLONOSCOPY WITH PROPOFOL;  Surgeon: Daneil Dolin, MD;  Location: [facility];  Service: Endoscopy;  Laterality: N/A;  . KNEE ARTHROSCOPY  1989  . LUMBAR LAMINECTOMY  1986  . Nose surgery - broken nose    . POLYPECTOMY  11/30/2015   Procedure: POLYPECTOMY;  Surgeon: Daneil Dolin, MD;  Location: [facility];  Service: Endoscopy;;  polyp at ascending colon, rectal polyp  . TRANSURETHRAL INCISION OF PROSTATE N/A 01/20/2015   Procedure: TRANSURETHRAL INCISION OF THE PROSTATE (TUIP);  Surgeon: Irine Seal, MD;   Location: [facility];  Service: Urology;  Laterality: N/A;     Current Meds  Medication Sig  . amLODipine (NORVASC) 10 MG tablet Take 1 tablet (10 mg total) by mouth daily.  Marland Kitchen aspirin 325 MG tablet Take 1 tablet (325 mg total) by mouth daily.  Marland Kitchen atorvastatin (LIPITOR) 40 MG tablet Take 40 mg by mouth daily.  . metFORMIN (GLUCOPHAGE) 500 MG tablet Take 500 mg by mouth daily with breakfast.  . telmisartan (MICARDIS) 40 MG tablet Take 40 mg by mouth daily.      Allergies:   Patient has no known allergies.   Social History   Tobacco Use  . Smoking status: Former Smoker    Packs/day: 1.50    Years: 14.00    Pack years: 21.00    Types: Cigarettes    Quit date: 08/06/1975    Years since quitting: 43.2  . Smokeless tobacco: Never Used  Substance Use Topics  . Alcohol use: Yes    Alcohol/week: 3.0 standard drinks    Types: 3 Cans of beer per week    Comment: 3 12 oz cans of beer each week.None since 03/2014  . Drug use: No     Family Hx: The patient's family history includes Heart disease in his sister; Hypertension in his sister.  ROS:   Please see the history of present illness.     All other systems reviewed and are negative.   Prior CV studies:   The following studies were reviewed today:  08/2017 echo Study Conclusions  - Left ventricle: Anbormal septal motion The cavity size was normal. Wall thickness was increased in a pattern of moderate LVH. Systolic function was normal. The estimated ejection fraction was in the range of 55% to 60%. Wall motion was normal; there were no regional wall motion abnormalities. Left ventricular diastolic function parameters were normal. - Aortic valve: There was mild regurgitation. - Left atrium: The atrium was mildly dilated. - Atrial septum: No defect or patent foramen ovale was identified.   08/2017 carotid US IMPRESSION: 1. Mild (1-49%) stenosis proximal left internal carotid artery secondary to heterogenous  atherosclerotic plaque. 2. No significant atherosclerotic plaque or stenosis in the right internal carotid artery. 3. The bilateral vertebral arteries are patent with normal antegrade flow.  Labs/Other Tests and Data Reviewed:    EKG:  No ECG reviewed.  Recent Labs: 08/28/2018: ALT 20; BUN 27; Creatinine, Ser 1.85; Hemoglobin 16.6; Platelets 309; Potassium 4.3; Sodium 134   Recent Lipid Panel Lab Results  Component Value Date/Time   CHOL 169 08/23/2017 01:13 AM   TRIG 118 08/23/2017 01:13 AM   HDL 37 (L) 08/23/2017 01:13 AM   CHOLHDL 4.6 08/23/2017 01:13 AM   LDLCALC 108 (H) 08/23/2017 01:13 AM    Wt Readings from Last 3 Encounters:  11/08/18 170 lb (77.1 kg)  09/03/18  170 lb (77.1 kg)  08/28/18 170 lb (77.1 kg)     Objective:    Vital Signs:  Ht 5' 11.5" (1.816 m)   Wt 170 lb (77.1 kg)   BMI 23.38 kg/m    Today's Vitals   11/08/18 1412  Weight: 170 lb (77.1 kg)  Height: 5' 11.5" (1.816 m)   Body mass index is 23.38 kg/m.  Normal affect. Normal speech pattern and tone. Comfortable, no apparent distress. No audible signs of SOB or wheezing.   ASSESSMENT & PLAN:    1. Dizziness/fatigue/PVCs - no recurrent episodes - no further workup at this time - if recurrent episodes would consider home cardiac monitor.    COVID-19 Education: The signs and symptoms of COVID-19 were discussed with the patient and how to seek care for testing (follow up with PCP or arrange E-visit).  The importance of social distancing was discussed today.  Time:   Today, I have spent 15 minutes with the patient with telehealth technology discussing the above problems.     Medication Adjustments/Labs and Tests Ordered: Current medicines are reviewed at length with the patient today.  Concerns regarding medicines are outlined above.   Tests Ordered: No orders of the defined types were placed in this encounter.   Medication Changes: No orders of the defined types were placed in this  encounter.   Follow Up:  In Person in 6 month(s)  Signed, Carlyle Dolly, MD  11/08/2018 2:29 PM    [place]

## 2018-11-09 DIAGNOSIS — I639 Cerebral infarction, unspecified: Secondary | ICD-10-CM | POA: Diagnosis not present

## 2018-11-09 DIAGNOSIS — E1165 Type 2 diabetes mellitus with hyperglycemia: Secondary | ICD-10-CM | POA: Diagnosis not present

## 2018-11-09 DIAGNOSIS — E782 Mixed hyperlipidemia: Secondary | ICD-10-CM | POA: Diagnosis not present

## 2018-11-09 DIAGNOSIS — I10 Essential (primary) hypertension: Secondary | ICD-10-CM | POA: Diagnosis not present

## 2018-11-26 DIAGNOSIS — R21 Rash and other nonspecific skin eruption: Secondary | ICD-10-CM | POA: Diagnosis not present

## 2018-12-07 DIAGNOSIS — I639 Cerebral infarction, unspecified: Secondary | ICD-10-CM | POA: Diagnosis not present

## 2018-12-07 DIAGNOSIS — I10 Essential (primary) hypertension: Secondary | ICD-10-CM | POA: Diagnosis not present

## 2018-12-07 DIAGNOSIS — E782 Mixed hyperlipidemia: Secondary | ICD-10-CM | POA: Diagnosis not present

## 2018-12-07 DIAGNOSIS — E1165 Type 2 diabetes mellitus with hyperglycemia: Secondary | ICD-10-CM | POA: Diagnosis not present

## 2019-01-14 DIAGNOSIS — Z0000 Encounter for general adult medical examination without abnormal findings: Secondary | ICD-10-CM | POA: Diagnosis not present

## 2019-01-22 DIAGNOSIS — E1169 Type 2 diabetes mellitus with other specified complication: Secondary | ICD-10-CM | POA: Diagnosis not present

## 2019-01-22 DIAGNOSIS — I10 Essential (primary) hypertension: Secondary | ICD-10-CM | POA: Diagnosis not present

## 2019-01-22 DIAGNOSIS — E119 Type 2 diabetes mellitus without complications: Secondary | ICD-10-CM | POA: Diagnosis not present

## 2019-01-22 DIAGNOSIS — E1165 Type 2 diabetes mellitus with hyperglycemia: Secondary | ICD-10-CM | POA: Diagnosis not present

## 2019-01-22 DIAGNOSIS — E785 Hyperlipidemia, unspecified: Secondary | ICD-10-CM | POA: Diagnosis not present

## 2019-01-22 DIAGNOSIS — E782 Mixed hyperlipidemia: Secondary | ICD-10-CM | POA: Diagnosis not present

## 2019-01-31 DIAGNOSIS — E1122 Type 2 diabetes mellitus with diabetic chronic kidney disease: Secondary | ICD-10-CM | POA: Diagnosis not present

## 2019-01-31 DIAGNOSIS — I10 Essential (primary) hypertension: Secondary | ICD-10-CM | POA: Diagnosis not present

## 2019-01-31 DIAGNOSIS — I499 Cardiac arrhythmia, unspecified: Secondary | ICD-10-CM | POA: Diagnosis not present

## 2019-01-31 DIAGNOSIS — Z09 Encounter for follow-up examination after completed treatment for conditions other than malignant neoplasm: Secondary | ICD-10-CM | POA: Diagnosis not present

## 2019-01-31 DIAGNOSIS — N182 Chronic kidney disease, stage 2 (mild): Secondary | ICD-10-CM | POA: Diagnosis not present

## 2019-02-07 DIAGNOSIS — H47091 Other disorders of optic nerve, not elsewhere classified, right eye: Secondary | ICD-10-CM | POA: Diagnosis not present

## 2019-02-07 DIAGNOSIS — H53451 Other localized visual field defect, right eye: Secondary | ICD-10-CM | POA: Diagnosis not present

## 2019-02-07 DIAGNOSIS — H35033 Hypertensive retinopathy, bilateral: Secondary | ICD-10-CM | POA: Diagnosis not present

## 2019-02-07 DIAGNOSIS — H53131 Sudden visual loss, right eye: Secondary | ICD-10-CM | POA: Diagnosis not present

## 2019-02-07 DIAGNOSIS — H5319 Other subjective visual disturbances: Secondary | ICD-10-CM | POA: Diagnosis not present

## 2019-02-07 DIAGNOSIS — H25813 Combined forms of age-related cataract, bilateral: Secondary | ICD-10-CM | POA: Diagnosis not present

## 2019-02-12 DIAGNOSIS — I639 Cerebral infarction, unspecified: Secondary | ICD-10-CM | POA: Diagnosis not present

## 2019-02-12 DIAGNOSIS — E1165 Type 2 diabetes mellitus with hyperglycemia: Secondary | ICD-10-CM | POA: Diagnosis not present

## 2019-02-12 DIAGNOSIS — I10 Essential (primary) hypertension: Secondary | ICD-10-CM | POA: Diagnosis not present

## 2019-02-12 DIAGNOSIS — E782 Mixed hyperlipidemia: Secondary | ICD-10-CM | POA: Diagnosis not present

## 2019-02-13 DIAGNOSIS — Z23 Encounter for immunization: Secondary | ICD-10-CM | POA: Diagnosis not present

## 2019-02-27 ENCOUNTER — Other Ambulatory Visit: Payer: Self-pay

## 2019-03-11 DIAGNOSIS — I639 Cerebral infarction, unspecified: Secondary | ICD-10-CM | POA: Diagnosis not present

## 2019-03-11 DIAGNOSIS — E1165 Type 2 diabetes mellitus with hyperglycemia: Secondary | ICD-10-CM | POA: Diagnosis not present

## 2019-03-11 DIAGNOSIS — I10 Essential (primary) hypertension: Secondary | ICD-10-CM | POA: Diagnosis not present

## 2019-03-11 DIAGNOSIS — E782 Mixed hyperlipidemia: Secondary | ICD-10-CM | POA: Diagnosis not present

## 2019-06-26 IMAGING — DX DG CHEST 2V
3 series · 3 of 3 positions shown · non-contrast
Comparison: 03/24/2014

CLINICAL DATA: Syncope.

EXAM:
CHEST - 2 VIEW

[chest lat]
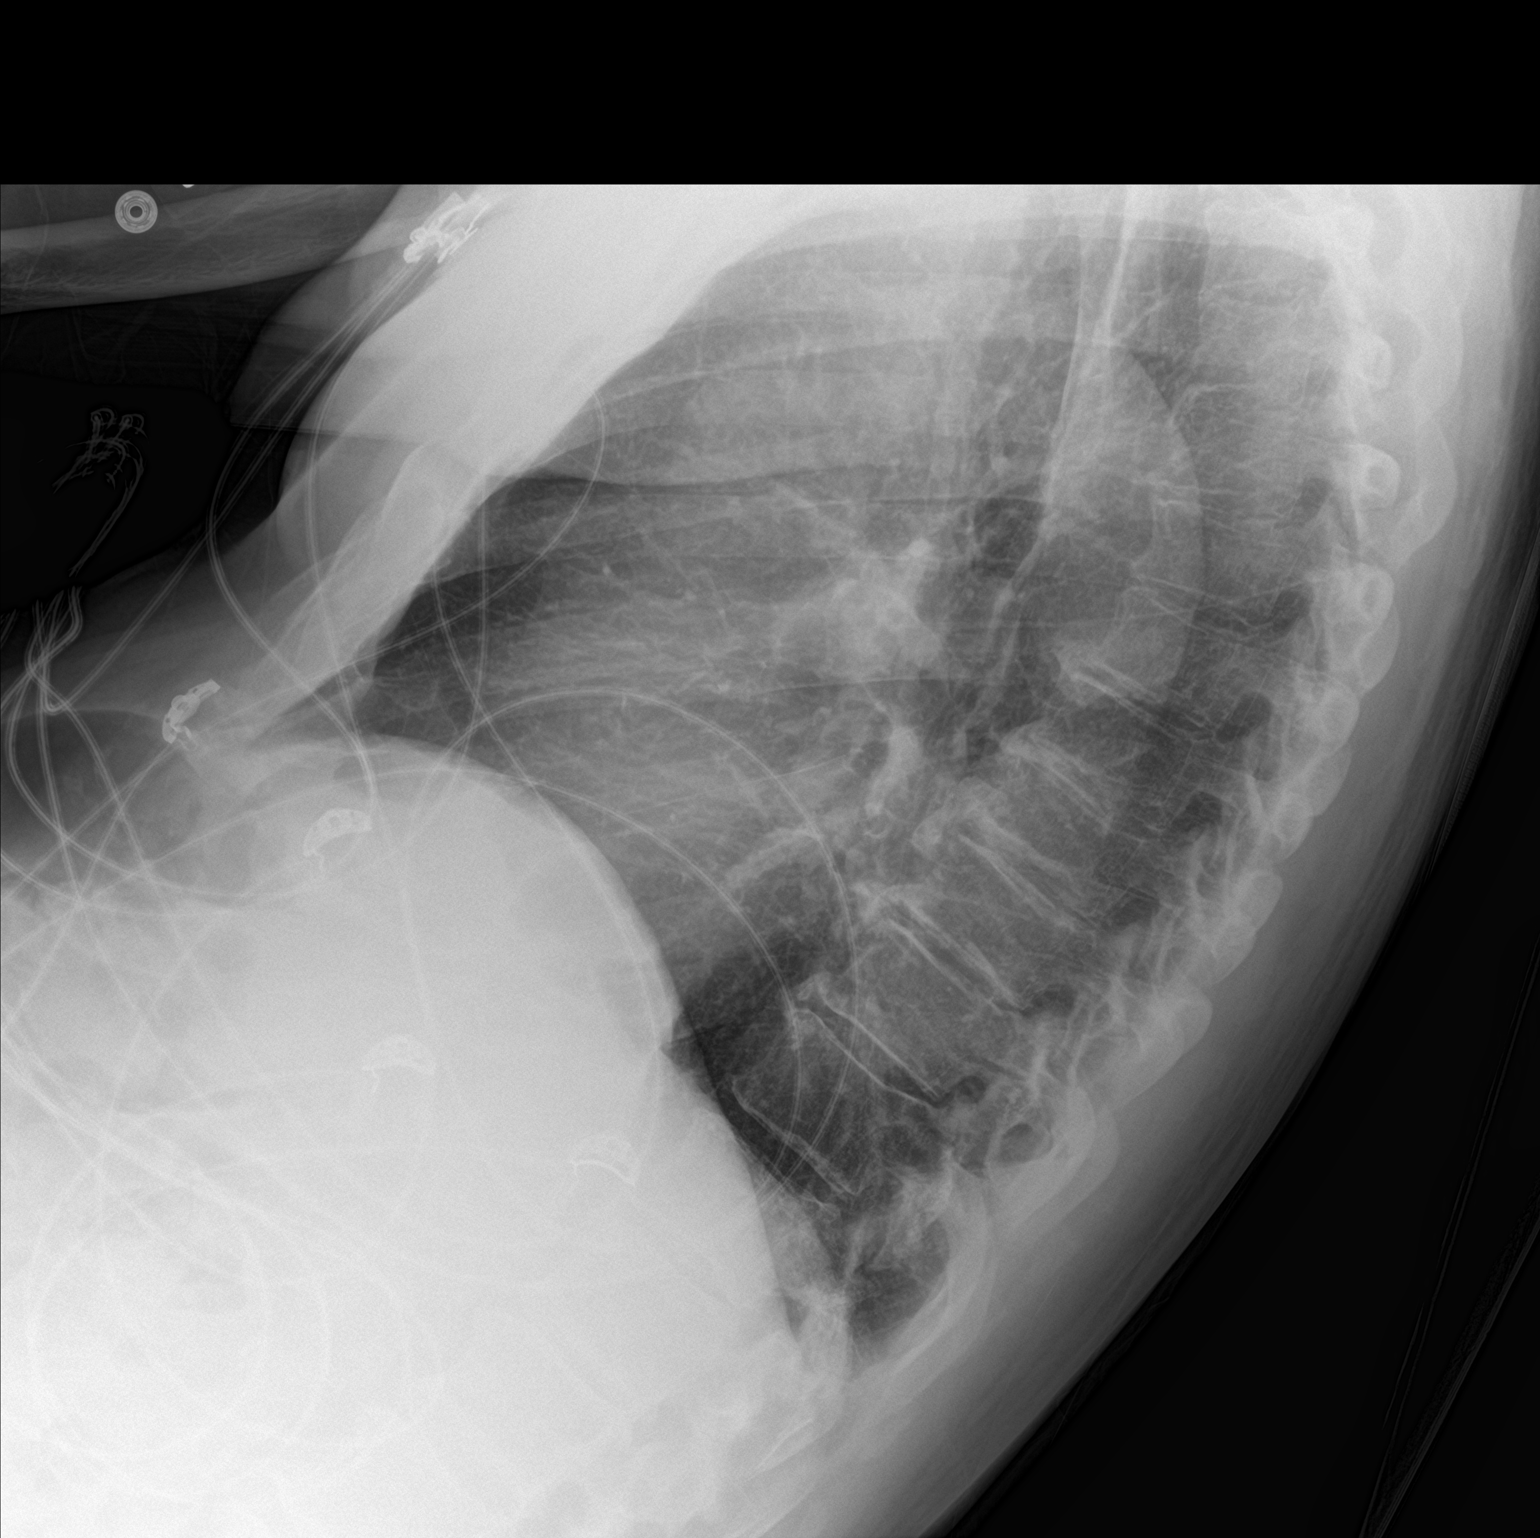

[chest ap (1 of 2)]
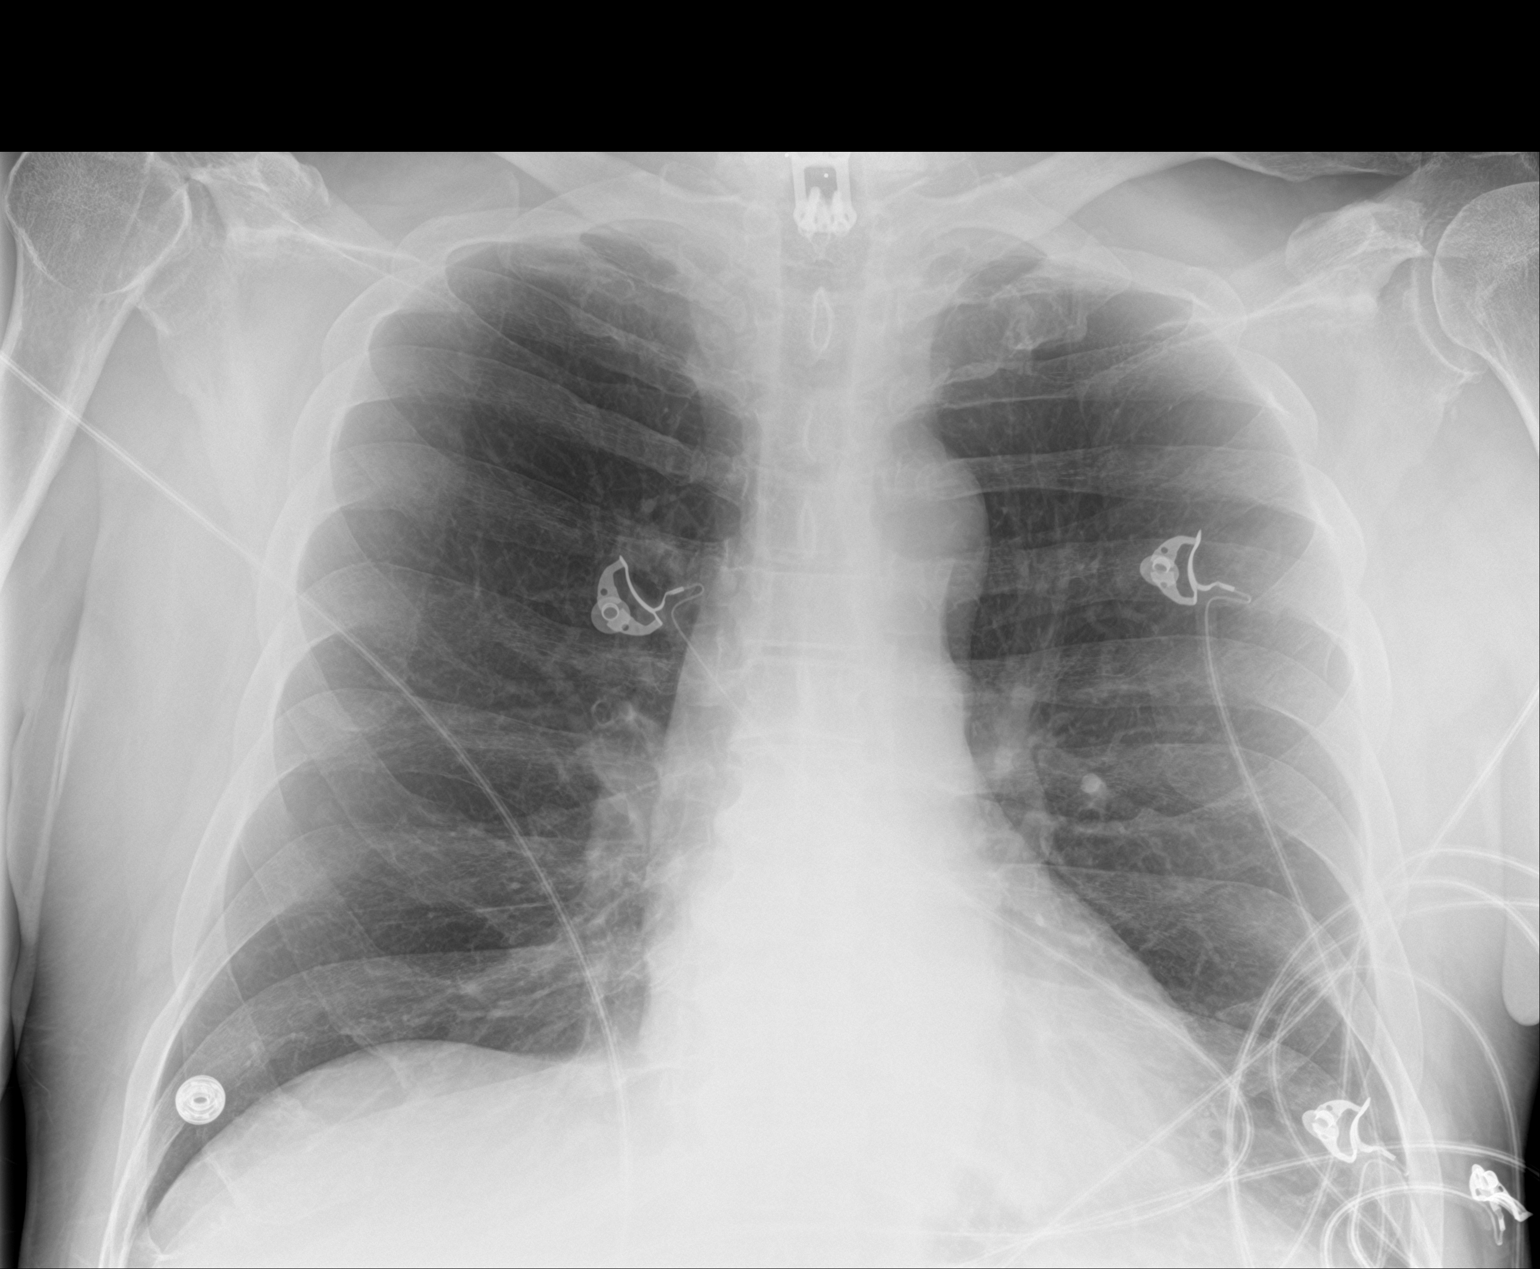

[chest ap (2 of 2)]
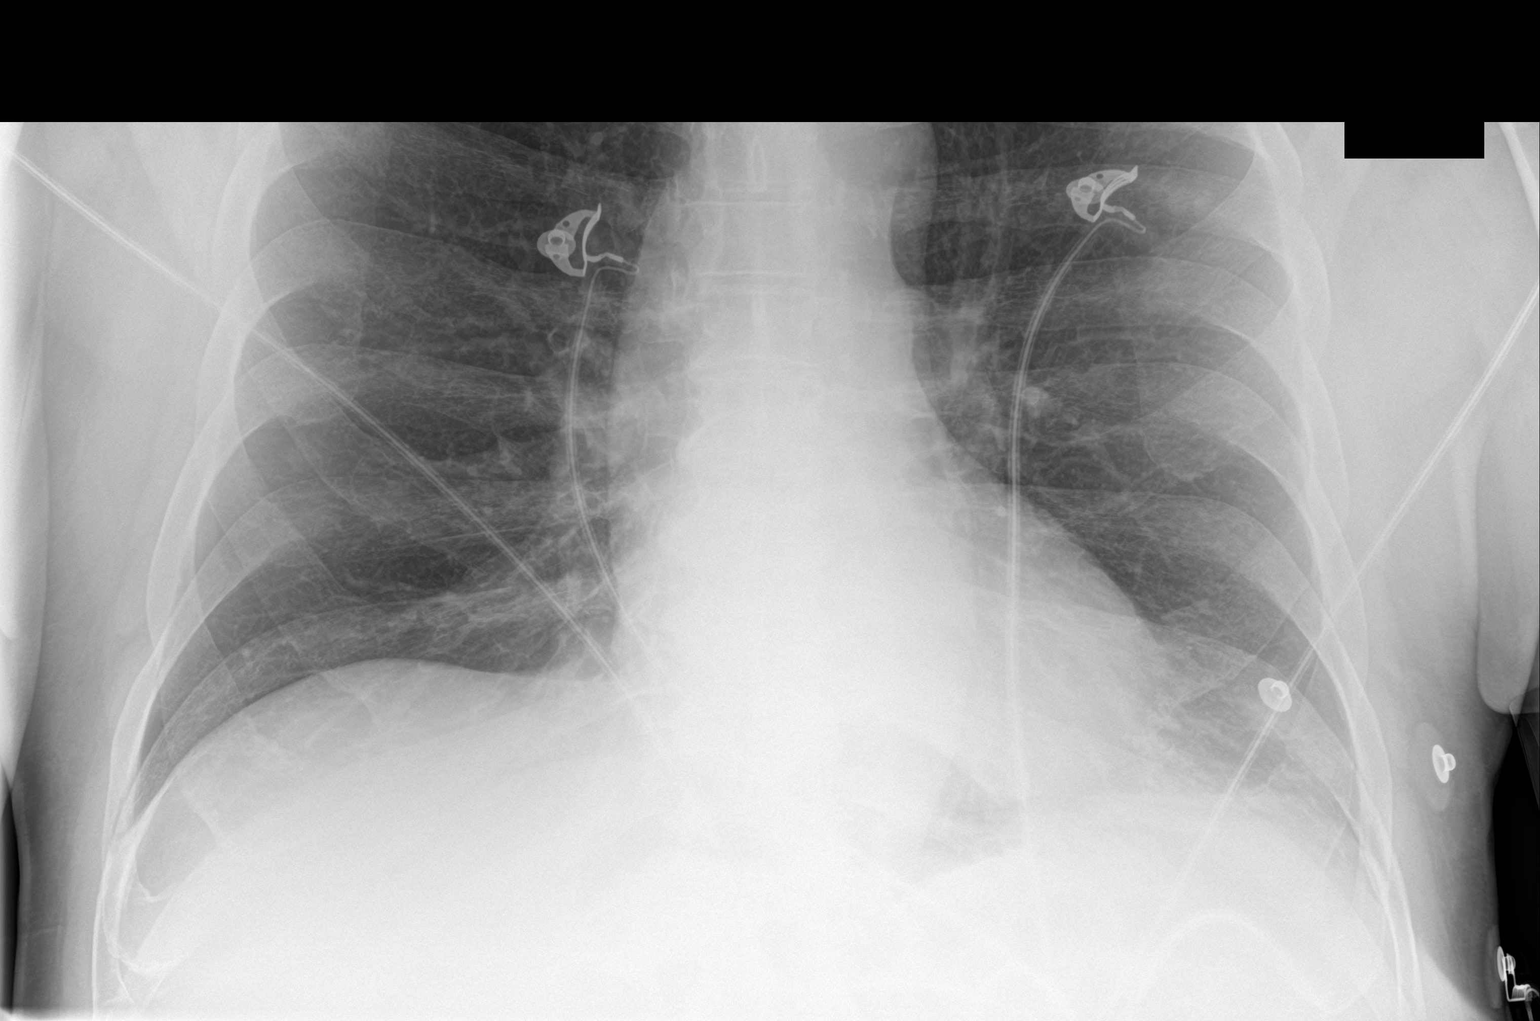

[3 of 3 positions shown; findings below may reference images not displayed]

FINDINGS: The cardiomediastinal contours are normal. Minor left lung base
atelectasis. Pulmonary vasculature is normal. No consolidation,
pleural effusion, or pneumothorax. No acute osseous abnormalities
are seen. Cervical spine fusion hardware partially included.
IMPRESSION: Minor left lung base atelectasis.

## 2019-06-27 IMAGING — MR MR MRA HEAD W/O CM
9 of 11 series · 34 of 48 positions shown · non-contrast
Comparison: Head CT 08/22/2017 and MRI 04/02/2010

CLINICAL DATA: Altered mental status. Syncope. Right basal ganglia
infarct on CT.

EXAM:
MRI HEAD WITHOUT CONTRAST
MRA HEAD WITHOUT CONTRAST
TECHNIQUE: Multiplanar, multiecho pulse sequences of the brain and surrounding
structures were obtained without intravenous contrast. Angiographic
images of the head were obtained using MRA technique without
contrast.

[Series 2: [id number] · sagittal · 5.0mm · 0.43mm/px · 2 of 20 slices shown]
[im 1/20]
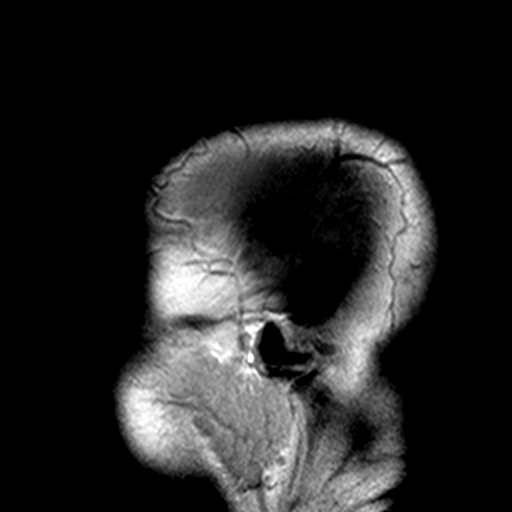
[im 20/20]
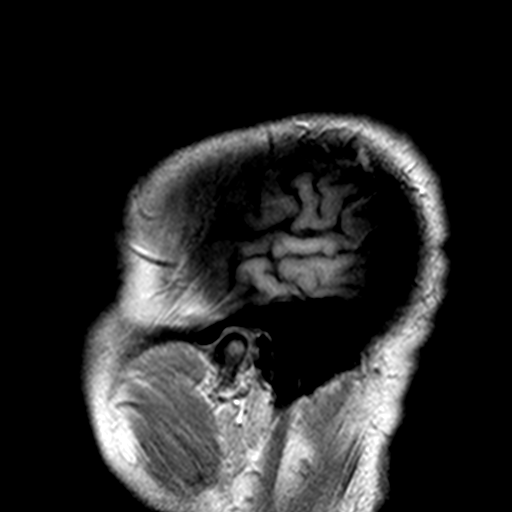

[Series 4: DWI · axial · 3.0mm · 0.82mm/px · z∈[-111,+51]mm · 5 of 55 slices shown (1 of 4)]
[im 1/55]
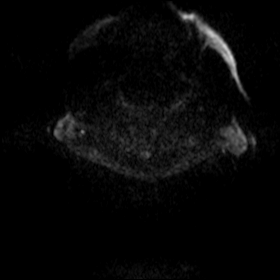
[im 14/55]
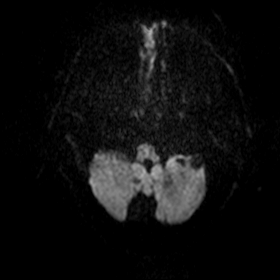
[im 28/55]
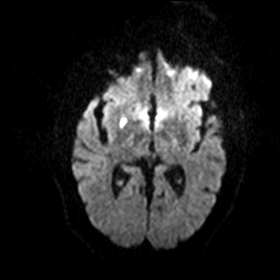
[im 41/55]
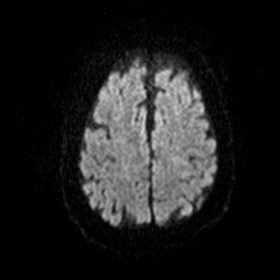
[im 55/55]
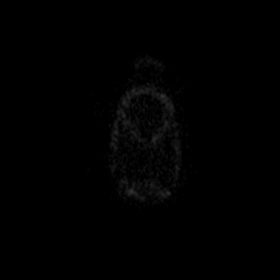

[Series 5: DWI · axial · 3.0mm · 0.82mm/px · z∈[-111,+51]mm · 5 of 55 slices shown (2 of 4)]
[im 1/55]
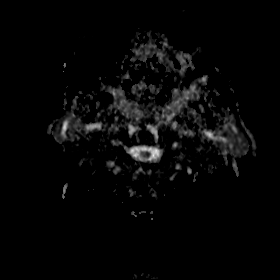
[im 14/55]
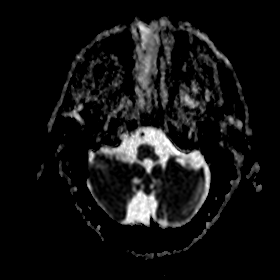
[im 28/55]
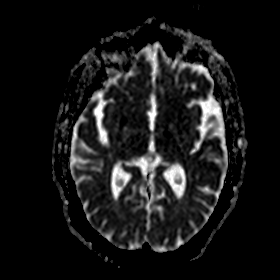
[im 41/55]
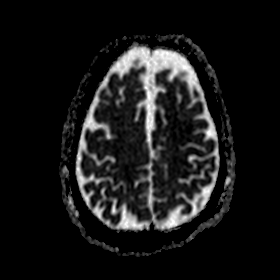
[im 55/55]
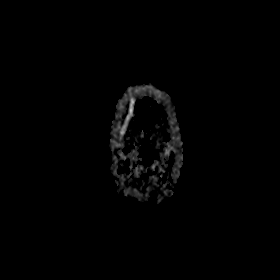

[Series 6: DWI · coronal · 5.0mm · 0.52mm/px · 3 of 34 slices shown (3 of 4)]
[im 1/34]
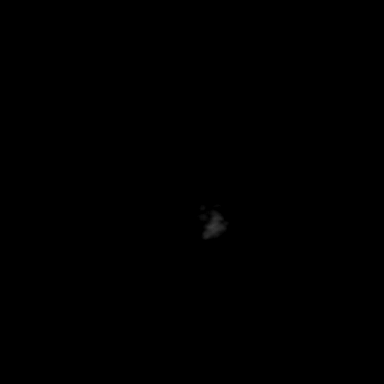
[im 17/34]
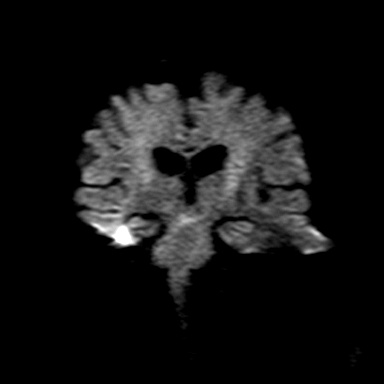
[im 34/34]
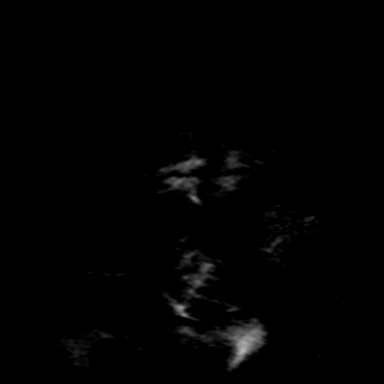

[Series 7: DWI · coronal · 5.0mm · 0.52mm/px · 3 of 34 slices shown (4 of 4)]
[im 1/34]
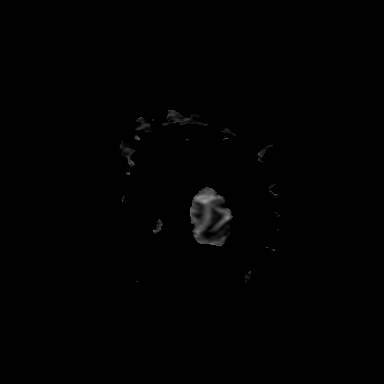
[im 17/34]
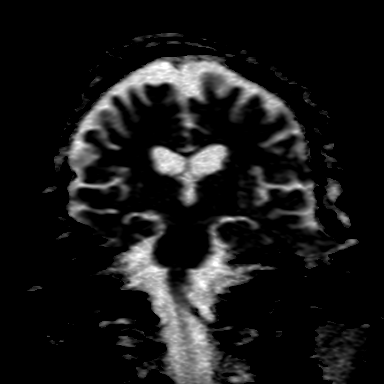
[im 34/34]
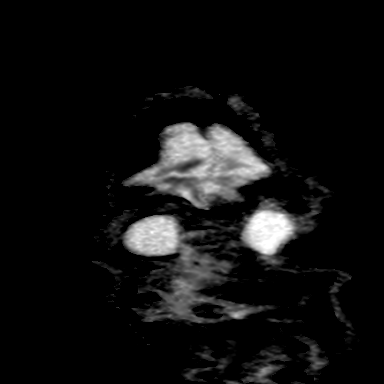

[Series 8: T2 · axial · 5.0mm · 0.75mm/px · z∈[-102,+41]mm · 2 of 23 slices shown (1 of 2)]
[im 1/23]
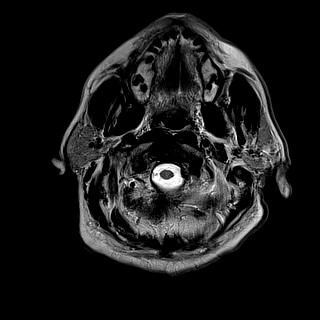
[im 23/23]
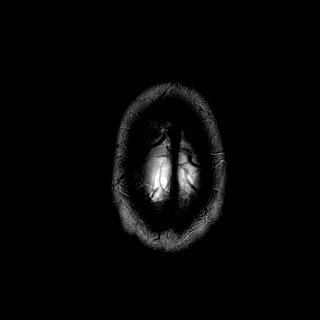

[Series 13: FLAIR · axial · 3.0mm · 0.94mm/px · z∈[-99,+39]mm · 4 of 47 slices shown]
[im 1/47]
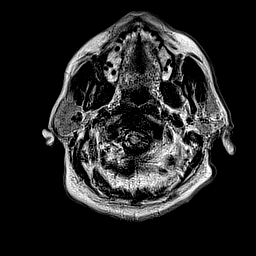
[im 16/47]
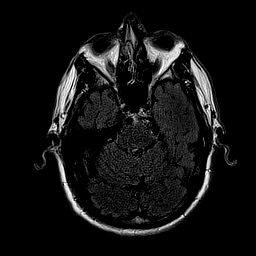
[im 31/47]
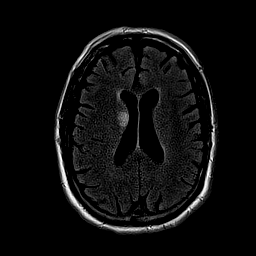
[im 47/47]
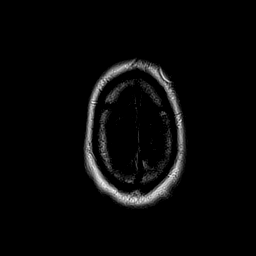

[Series 14: T1 · axial · 2.0mm · 0.47mm/px · z∈[-114,+74]mm · 8 of 95 slices shown]
[im 1/95]
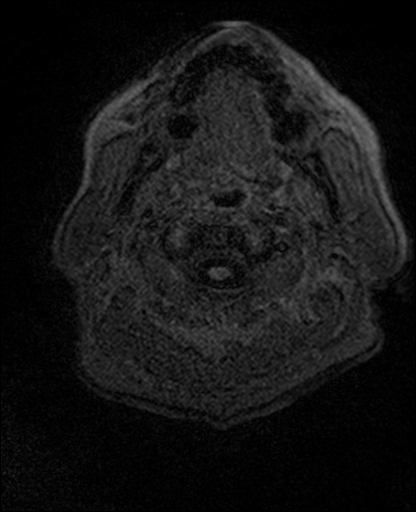
[im 14/95]
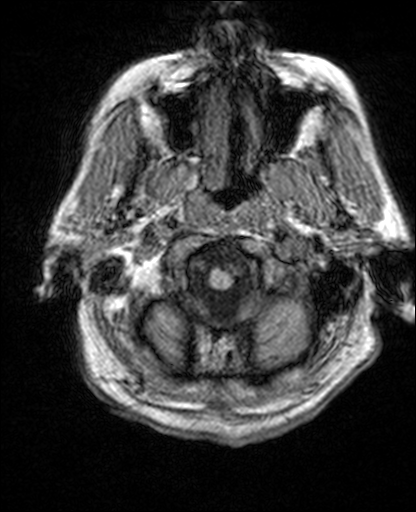
[im 27/95]
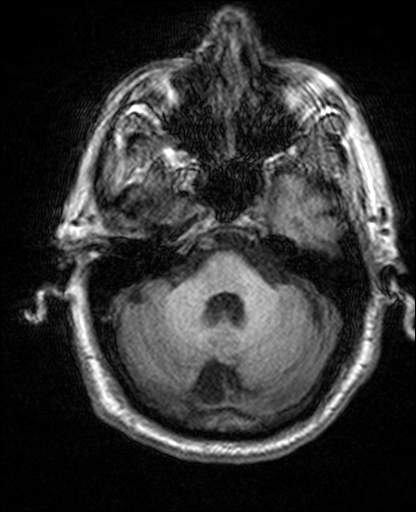
[im 41/95]
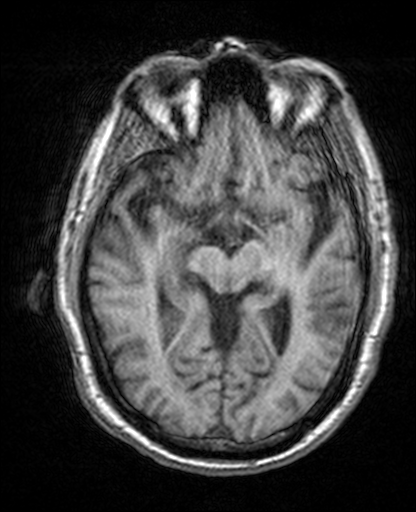
[im 54/95]
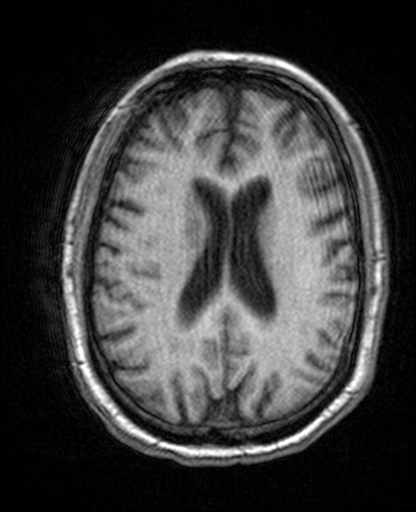
[im 68/95]
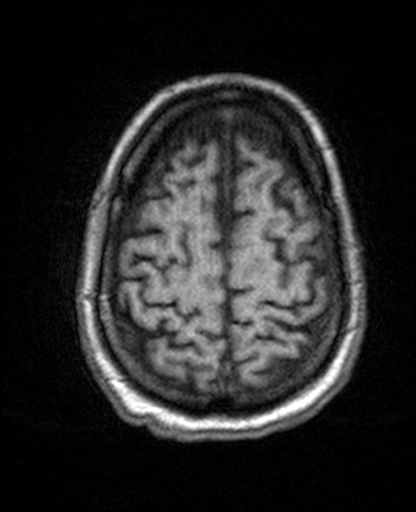
[im 81/95]
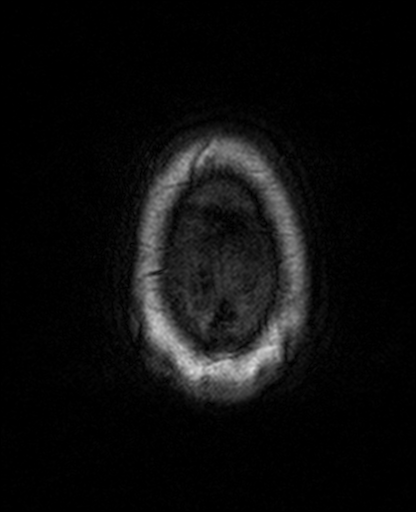
[im 95/95]
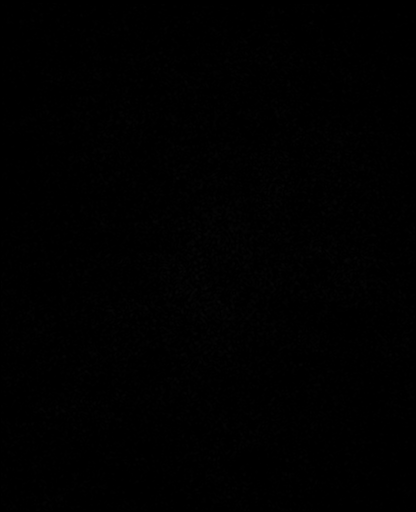

[Series 17: T2 · coronal · 5.0mm · 0.68mm/px · 2 of 28 slices shown (2 of 2)]
[im 1/28]
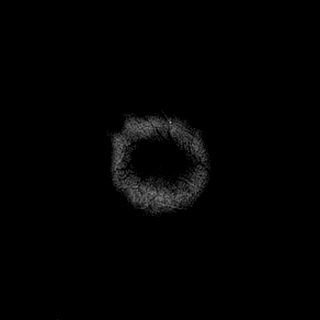
[im 28/28]
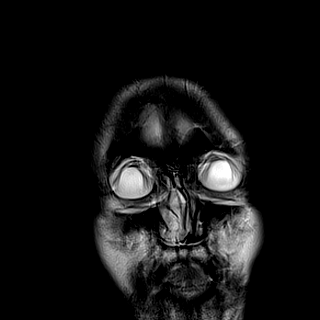

[34 of 48 positions shown; findings below may reference images not displayed]

FINDINGS: MRI HEAD FINDINGS

Brain: An acute right basal ganglia infarct involves both the
caudate and lentiform nuclei and measures 3 x 1.5 cm. No
intracranial hemorrhage, mass, midline shift, or extra-axial fluid
collection is identified. Mild cerebral atrophy is within normal
limits for age. There is a mega cisterna magna, a normal variant. No
significant chronic white matter disease is present for age.

Vascular: Mildly abnormal appearance of the distal right vertebral
artery is unchanged from the prior MRI and more fully evaluated
below. Other major intracranial vascular flow voids are preserved.

Skull and upper cervical spine: Unremarkable bone marrow signal.

Sinuses/Orbits: Unremarkable orbits. Paranasal sinuses and mastoid
air cells are clear.

Other: None.

MRA HEAD FINDINGS

The study is mildly motion degraded.

The visualized distal left vertebral artery is widely patent and
dominant. There is a segmental region of signal loss involving the
distal most right V3 and proximal V4 segments with the vessel
appearing patent proximal to this in the included V3 segment as well
as patent more distally to the PICA origin. This region of signal
loss may be artifactual or reflect a segmental occlusion or reduced
flow from an underlying V3-V4 junction stenosis. The right vertebral
artery distal to the PICA origin is not visualized and is likely
occluded. The basilar artery is patent with minimal narrowing
proximally. Posterior communicating arteries are not identified and
may be diminutive or absent. The PCAs are patent without evidence of
flow limiting proximal stenosis. There is at most mild narrowing
versus mild motion artifact involving both P1 segments. There is
asymmetric attenuation of the P2 and more distal portions of the
right PCA.

The internal carotid arteries are patent from skull base to carotid
termini with mild-to-moderate paraclinoid stenoses bilaterally. ACAs
and MCAs are patent without evidence of proximal branch occlusion.
There is the suggestion of mild right and moderate left proximal A1
stenoses and a mild to moderate right mid M1 stenosis, however
motion may exaggerate the appearance. No aneurysm is identified.
IMPRESSION: 1. Acute right basal ganglia infarct.
2. Occlusion of the non-dominant right vertebral artery distal to
PICA. Segmental occlusion versus reduced flow and/or artifact more
proximally in the right V4 segment.
3. No major anterior circulation branch occlusion. Mild-to-moderate
bilateral ICA, proximal ACA, and right M1 stenoses.

## 2019-06-27 IMAGING — DX DG CHEST 2V
2 series · 2 of 2 positions shown · non-contrast
Comparison: 08/22/2017 and 03/24/2014 and CT scan of the abdomen
dated 05/31/2009

CLINICAL DATA: Altered mental status.

EXAM:
CHEST - 2 VIEW

[chest pa]
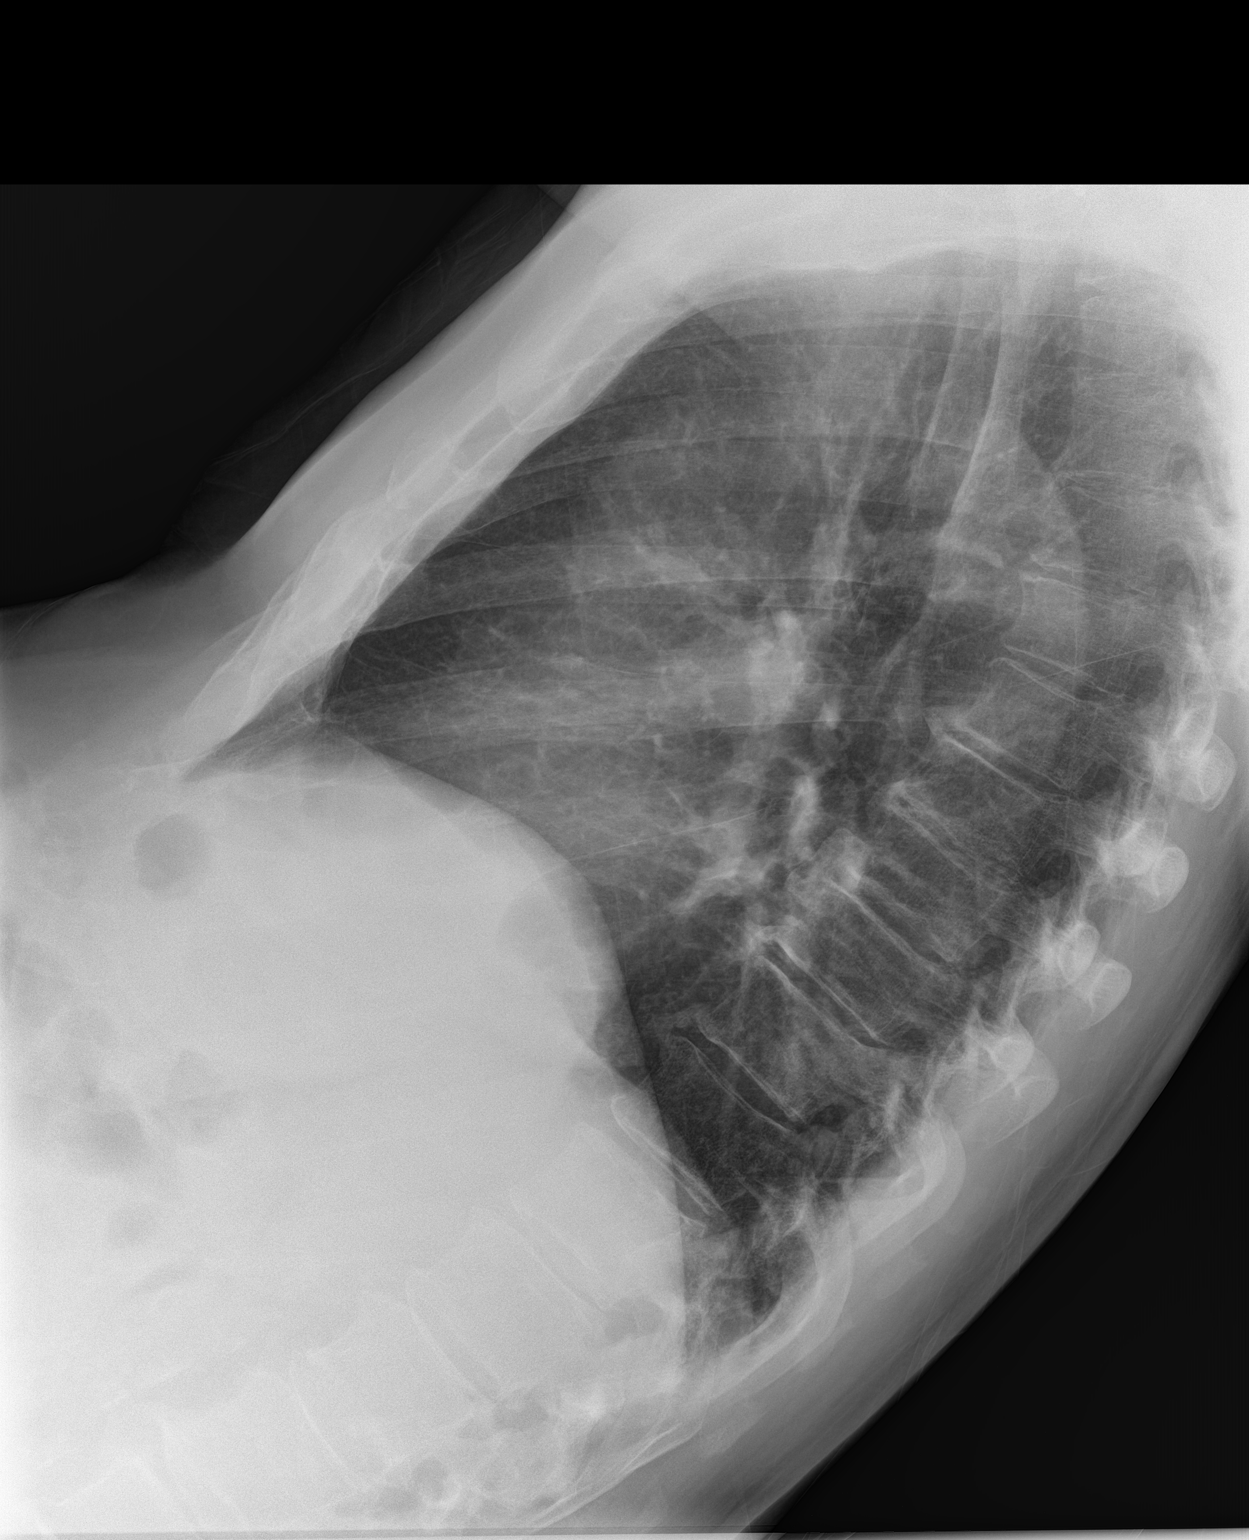

[chest ap]
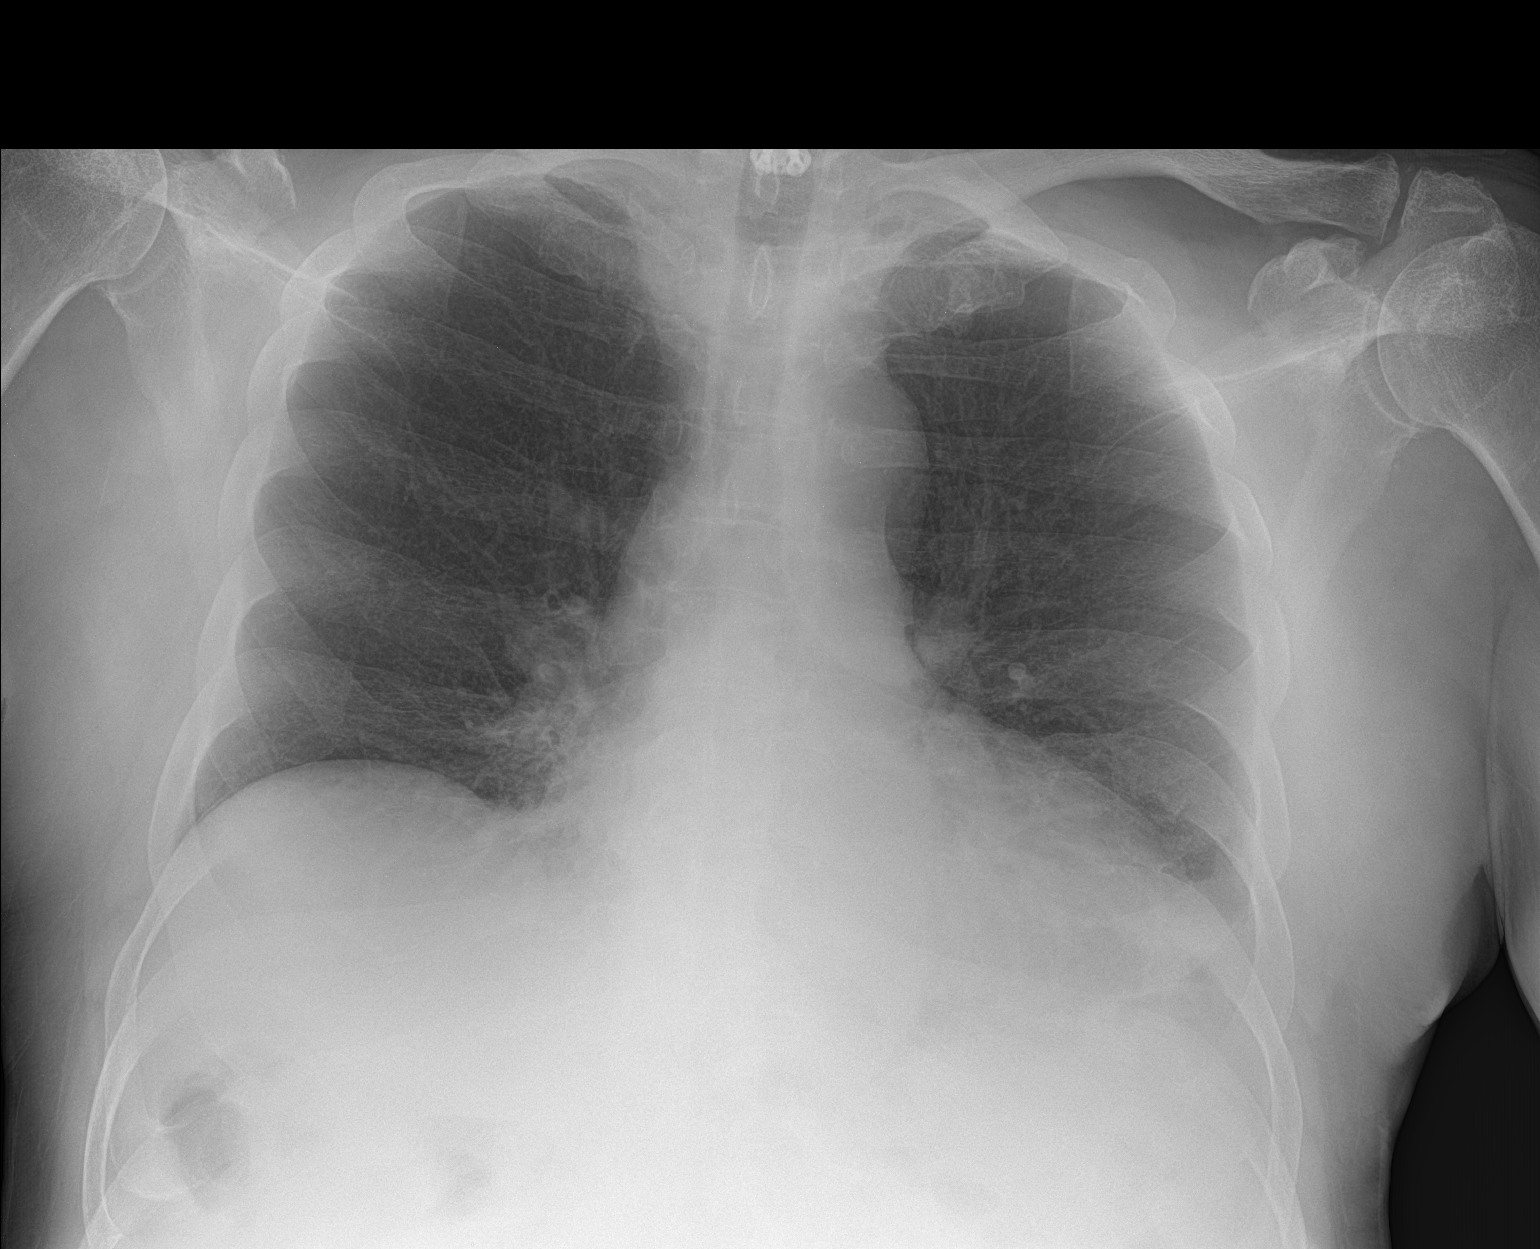

[2 of 2 positions shown; findings below may reference images not displayed]

FINDINGS: The heart size and pulmonary vascularity are normal. Prominent left
pericardial fat pad. No infiltrates or effusions. No bone
abnormality.
IMPRESSION: No active cardiopulmonary disease.

## 2019-07-04 DIAGNOSIS — L235 Allergic contact dermatitis due to other chemical products: Secondary | ICD-10-CM | POA: Diagnosis not present

## 2019-07-17 DIAGNOSIS — E1165 Type 2 diabetes mellitus with hyperglycemia: Secondary | ICD-10-CM | POA: Diagnosis not present

## 2019-07-17 DIAGNOSIS — I10 Essential (primary) hypertension: Secondary | ICD-10-CM | POA: Diagnosis not present

## 2019-07-17 DIAGNOSIS — E782 Mixed hyperlipidemia: Secondary | ICD-10-CM | POA: Diagnosis not present

## 2019-08-13 DIAGNOSIS — E1165 Type 2 diabetes mellitus with hyperglycemia: Secondary | ICD-10-CM | POA: Diagnosis not present

## 2019-08-13 DIAGNOSIS — R5383 Other fatigue: Secondary | ICD-10-CM | POA: Diagnosis not present

## 2019-08-13 DIAGNOSIS — E1169 Type 2 diabetes mellitus with other specified complication: Secondary | ICD-10-CM | POA: Diagnosis not present

## 2019-08-13 DIAGNOSIS — E119 Type 2 diabetes mellitus without complications: Secondary | ICD-10-CM | POA: Diagnosis not present

## 2019-08-13 DIAGNOSIS — E1122 Type 2 diabetes mellitus with diabetic chronic kidney disease: Secondary | ICD-10-CM | POA: Diagnosis not present

## 2019-08-13 DIAGNOSIS — E782 Mixed hyperlipidemia: Secondary | ICD-10-CM | POA: Diagnosis not present

## 2019-08-13 DIAGNOSIS — G4700 Insomnia, unspecified: Secondary | ICD-10-CM | POA: Diagnosis not present

## 2019-08-15 ENCOUNTER — Other Ambulatory Visit (HOSPITAL_COMMUNITY): Payer: Self-pay | Admitting: Adult Health Nurse Practitioner

## 2019-08-15 ENCOUNTER — Other Ambulatory Visit: Payer: Self-pay | Admitting: Adult Health Nurse Practitioner

## 2019-08-15 ENCOUNTER — Other Ambulatory Visit: Payer: Self-pay | Admitting: Internal Medicine

## 2019-08-15 ENCOUNTER — Other Ambulatory Visit (HOSPITAL_COMMUNITY): Payer: Self-pay | Admitting: Internal Medicine

## 2019-08-15 ENCOUNTER — Other Ambulatory Visit (HOSPITAL_COMMUNITY): Payer: Self-pay | Admitting: Gastroenterology

## 2019-08-15 ENCOUNTER — Other Ambulatory Visit: Payer: Self-pay | Admitting: Gastroenterology

## 2019-08-15 DIAGNOSIS — R748 Abnormal levels of other serum enzymes: Secondary | ICD-10-CM

## 2019-08-23 ENCOUNTER — Ambulatory Visit (HOSPITAL_COMMUNITY): Payer: Medicare Other

## 2019-08-23 ENCOUNTER — Encounter (HOSPITAL_COMMUNITY): Payer: Self-pay

## 2019-10-18 DIAGNOSIS — Z8673 Personal history of transient ischemic attack (TIA), and cerebral infarction without residual deficits: Secondary | ICD-10-CM | POA: Diagnosis not present

## 2019-10-18 DIAGNOSIS — R350 Frequency of micturition: Secondary | ICD-10-CM | POA: Diagnosis not present

## 2019-10-18 DIAGNOSIS — E1169 Type 2 diabetes mellitus with other specified complication: Secondary | ICD-10-CM | POA: Diagnosis not present

## 2019-10-18 DIAGNOSIS — N411 Chronic prostatitis: Secondary | ICD-10-CM | POA: Diagnosis not present

## 2019-10-18 DIAGNOSIS — R5383 Other fatigue: Secondary | ICD-10-CM | POA: Diagnosis not present

## 2019-10-18 DIAGNOSIS — M7981 Nontraumatic hematoma of soft tissue: Secondary | ICD-10-CM | POA: Diagnosis not present

## 2019-10-21 DIAGNOSIS — I129 Hypertensive chronic kidney disease with stage 1 through stage 4 chronic kidney disease, or unspecified chronic kidney disease: Secondary | ICD-10-CM | POA: Diagnosis not present

## 2019-10-21 DIAGNOSIS — Z0001 Encounter for general adult medical examination with abnormal findings: Secondary | ICD-10-CM | POA: Diagnosis not present

## 2019-10-21 DIAGNOSIS — N1831 Chronic kidney disease, stage 3a: Secondary | ICD-10-CM | POA: Diagnosis not present

## 2019-10-21 DIAGNOSIS — E1122 Type 2 diabetes mellitus with diabetic chronic kidney disease: Secondary | ICD-10-CM | POA: Diagnosis not present

## 2019-10-21 DIAGNOSIS — Z8673 Personal history of transient ischemic attack (TIA), and cerebral infarction without residual deficits: Secondary | ICD-10-CM | POA: Diagnosis not present

## 2019-11-13 DIAGNOSIS — J302 Other seasonal allergic rhinitis: Secondary | ICD-10-CM | POA: Diagnosis not present

## 2019-11-13 DIAGNOSIS — H6502 Acute serous otitis media, left ear: Secondary | ICD-10-CM | POA: Diagnosis not present

## 2019-12-07 ENCOUNTER — Emergency Department (HOSPITAL_COMMUNITY)
Admission: EM | Admit: 2019-12-07 | Discharge: 2019-12-07 | Discharge status: Against Medical Advice | Payer: Medicare Other | Attending: Emergency Medicine | Admitting: Emergency Medicine

## 2019-12-07 ENCOUNTER — Other Ambulatory Visit: Payer: Self-pay

## 2019-12-07 ENCOUNTER — Encounter (HOSPITAL_COMMUNITY): Payer: Self-pay | Admitting: *Deleted

## 2019-12-07 DIAGNOSIS — R0989 Other specified symptoms and signs involving the circulatory and respiratory systems: Secondary | ICD-10-CM | POA: Diagnosis not present

## 2019-12-07 DIAGNOSIS — Z5321 Procedure and treatment not carried out due to patient leaving prior to being seen by health care provider: Secondary | ICD-10-CM | POA: Insufficient documentation

## 2019-12-07 DIAGNOSIS — Z20822 Contact with and (suspected) exposure to covid-19: Secondary | ICD-10-CM | POA: Diagnosis not present

## 2019-12-07 LAB — SARS CORONAVIRUS 2 BY RT PCR (HOSPITAL ORDER, PERFORMED IN ~~LOC~~ HOSPITAL LAB): SARS Coronavirus 2: NEGATIVE

## 2019-12-07 NOTE — ED Triage Notes (Signed)
Runny nose, cold symptoms, request covid test

## 2019-12-26 DIAGNOSIS — G3184 Mild cognitive impairment, so stated: Secondary | ICD-10-CM | POA: Diagnosis not present

## 2020-02-24 DIAGNOSIS — G3184 Mild cognitive impairment, so stated: Secondary | ICD-10-CM | POA: Diagnosis not present

## 2020-02-24 DIAGNOSIS — R350 Frequency of micturition: Secondary | ICD-10-CM | POA: Diagnosis not present

## 2020-02-24 DIAGNOSIS — R5383 Other fatigue: Secondary | ICD-10-CM | POA: Diagnosis not present

## 2020-02-24 DIAGNOSIS — E1169 Type 2 diabetes mellitus with other specified complication: Secondary | ICD-10-CM | POA: Diagnosis not present

## 2020-02-24 DIAGNOSIS — M7981 Nontraumatic hematoma of soft tissue: Secondary | ICD-10-CM | POA: Diagnosis not present

## 2020-02-24 DIAGNOSIS — Z8673 Personal history of transient ischemic attack (TIA), and cerebral infarction without residual deficits: Secondary | ICD-10-CM | POA: Diagnosis not present

## 2020-02-26 DIAGNOSIS — R5383 Other fatigue: Secondary | ICD-10-CM | POA: Diagnosis not present

## 2020-02-26 DIAGNOSIS — N1831 Chronic kidney disease, stage 3a: Secondary | ICD-10-CM | POA: Diagnosis not present

## 2020-02-26 DIAGNOSIS — M7981 Nontraumatic hematoma of soft tissue: Secondary | ICD-10-CM | POA: Diagnosis not present

## 2020-02-26 DIAGNOSIS — I129 Hypertensive chronic kidney disease with stage 1 through stage 4 chronic kidney disease, or unspecified chronic kidney disease: Secondary | ICD-10-CM | POA: Diagnosis not present

## 2020-02-26 DIAGNOSIS — Z8673 Personal history of transient ischemic attack (TIA), and cerebral infarction without residual deficits: Secondary | ICD-10-CM | POA: Diagnosis not present

## 2020-02-26 DIAGNOSIS — E1122 Type 2 diabetes mellitus with diabetic chronic kidney disease: Secondary | ICD-10-CM | POA: Diagnosis not present

## 2020-02-26 DIAGNOSIS — E782 Mixed hyperlipidemia: Secondary | ICD-10-CM | POA: Diagnosis not present

## 2020-02-26 DIAGNOSIS — G3184 Mild cognitive impairment, so stated: Secondary | ICD-10-CM | POA: Diagnosis not present

## 2020-06-20 DIAGNOSIS — G4700 Insomnia, unspecified: Secondary | ICD-10-CM | POA: Diagnosis not present

## 2020-06-20 DIAGNOSIS — I129 Hypertensive chronic kidney disease with stage 1 through stage 4 chronic kidney disease, or unspecified chronic kidney disease: Secondary | ICD-10-CM | POA: Diagnosis not present

## 2020-06-24 ENCOUNTER — Emergency Department (HOSPITAL_COMMUNITY): Payer: Medicare Other

## 2020-06-24 ENCOUNTER — Other Ambulatory Visit: Payer: Self-pay

## 2020-06-24 ENCOUNTER — Encounter (HOSPITAL_COMMUNITY): Payer: Self-pay

## 2020-06-24 ENCOUNTER — Emergency Department (HOSPITAL_COMMUNITY)
Admission: EM | Admit: 2020-06-24 | Discharge: 2020-06-24 | Disposition: A | Discharge status: Home / Self Care | Payer: Medicare Other | Attending: Emergency Medicine | Admitting: Emergency Medicine

## 2020-06-24 DIAGNOSIS — Z79899 Other long term (current) drug therapy: Secondary | ICD-10-CM | POA: Diagnosis not present

## 2020-06-24 DIAGNOSIS — Z8673 Personal history of transient ischemic attack (TIA), and cerebral infarction without residual deficits: Secondary | ICD-10-CM | POA: Insufficient documentation

## 2020-06-24 DIAGNOSIS — N182 Chronic kidney disease, stage 2 (mild): Secondary | ICD-10-CM | POA: Diagnosis not present

## 2020-06-24 DIAGNOSIS — M7989 Other specified soft tissue disorders: Secondary | ICD-10-CM | POA: Insufficient documentation

## 2020-06-24 DIAGNOSIS — E1122 Type 2 diabetes mellitus with diabetic chronic kidney disease: Secondary | ICD-10-CM | POA: Insufficient documentation

## 2020-06-24 DIAGNOSIS — Z7984 Long term (current) use of oral hypoglycemic drugs: Secondary | ICD-10-CM | POA: Insufficient documentation

## 2020-06-24 DIAGNOSIS — Z7982 Long term (current) use of aspirin: Secondary | ICD-10-CM | POA: Insufficient documentation

## 2020-06-24 DIAGNOSIS — Z87891 Personal history of nicotine dependence: Secondary | ICD-10-CM | POA: Diagnosis not present

## 2020-06-24 DIAGNOSIS — I129 Hypertensive chronic kidney disease with stage 1 through stage 4 chronic kidney disease, or unspecified chronic kidney disease: Secondary | ICD-10-CM | POA: Insufficient documentation

## 2020-06-24 DIAGNOSIS — M25571 Pain in right ankle and joints of right foot: Secondary | ICD-10-CM

## 2020-06-24 MED ORDER — NAPROXEN 500 MG PO TABS: 500.0000 mg | ORAL_TABLET | Freq: Two times a day (BID) | ORAL | 0 refills | Status: DC

## 2020-06-24 NOTE — Discharge Instructions (Signed)
Wear the ankle brace for support and comfort.  Use ice and elevation as much as possible for the next several days to help reduce the swelling.  Take the medications prescribed for pain and swelling relief.   Call Dr. Nevada Crane for an office visit recheck if not improving over the next 7 days.

## 2020-06-24 NOTE — ED Triage Notes (Addendum)
Pt presents POV from home with right ankle pain. Pt says he went for a walk outside today, walked about a mile, and when he got home is right ankle started hurting. Pt denies injury. Pt reports taking 5mg  oxycodone just prior to arrival.

## 2020-06-24 NOTE — ED Provider Notes (Signed)
[facility] EMERGENCY DEPARTMENT Provider Note   CSN: [id number] Arrival date & time: 06/24/20  1353     History Chief Complaint  Patient presents with   Ankle Pain    Albert Garrison is a 75 y.o. male with history as outlined below presenting with right lateral ankle pain and swelling which started this morning.  He had gone for a walk this am and then went home, sat in his recliner chair, then when he stood up, discovered the pain and swelling.  He denies any obvious injury, no trips, falls or inversion injury.  He denies radiation of pain.  He took an oxycodone tablet which did not improve his symptoms.  HPI     Past Medical History:  Diagnosis Date   Arthritis    BPH (benign prostatic hyperplasia)    CKD (chronic kidney disease), stage II    Complicated UTI (urinary tract infection) Dec.2015   Depression    hx. of   Diabetes mellitus without complication (HCC)    diet controlled   Foley catheter in place 03/20/2014   GERD (gastroesophageal reflux disease)    occasionally   Heart murmur    hx. of at 17 yrs. old   HOH (hard of hearing)    Hypertension    Lower GI bleed 2017   a. ? due to polyp.   Pneumonia    hx. of   Rheumatic fever    when 4 yrs. old   Sleep apnea    does not use c-pap machine   Sodium (Na) deficiency    managed in nursing center12/2015-Feb-2016    Patient Active Problem List   Diagnosis Date Noted   Acute CVA (cerebrovascular accident) ([place]) 08/24/2017   Altered mental status 08/23/2017   Sleep apnea 08/23/2017   Elevated troponin 08/23/2017   Abnormal CT of the head 08/23/2017   Hypertension 08/23/2017   Type 2 diabetes mellitus ([place]) 08/23/2017   Syncope 08/23/2017   S/P carpal tunnel release right 07/13/17 07/27/2017   AKI (acute kidney injury) ([place]) 11/29/2015   Lower GI bleed 11/28/2015   Rectal bleed 11/28/2015   Rectal bleeding 11/28/2015   Benign prostatic hypertrophy with urinary retention  01/20/2015   Urinary retention 05/21/2014   ARF (acute renal failure) ([place]) 03/26/2014   IDDM (insulin dependent diabetes mellitus) ([place]) 03/26/2014   UTI (lower urinary tract infection) 03/24/2014   Hyponatremia 03/24/2014   Acute encephalopathy 03/24/2014    Past Surgical History:  Procedure Laterality Date   CARPAL TUNNEL RELEASE Left 2010   CARPAL TUNNEL RELEASE Right 07/13/2017   Procedure: CARPAL TUNNEL RELEASE;  Surgeon: Carole Civil, MD;  Location: [facility];  Service: Orthopedics;  Laterality: Right;   CERVICAL SPINE SURGERY  2010   COLONOSCOPY WITH PROPOFOL N/A 11/30/2015   Procedure: COLONOSCOPY WITH PROPOFOL;  Surgeon: Daneil Dolin, MD;  [facility];  Service: Endoscopy;  Laterality: N/A;   KNEE ARTHROSCOPY  [facility] surgery - broken nose     POLYPECTOMY  11/30/2015   Procedure: POLYPECTOMY;  Surgeon: Daneil Dolin, MD;  [facility];  Service: Endoscopy;;  polyp at ascending colon, rectal polyp   TRANSURETHRAL INCISION OF PROSTATE N/A 01/20/2015   Procedure: TRANSURETHRAL INCISION OF THE PROSTATE (TUIP);  Surgeon: Irine Seal, MD;  Location: [facility];  Service: Urology;  Laterality: N/A;       Family History  Problem Relation Age of Onset   Heart  disease Sister    Hypertension Sister     Social History   Tobacco Use   Smoking status: Former Smoker    Packs/day: 1.50    Years: 14.00    Pack years: 21.00    Types: Cigarettes    Quit date: 08/06/1975    Years since quitting: 44.9   Smokeless tobacco: Never Used  Vaping Use   Vaping Use: Never used  Substance Use Topics   Alcohol use: Yes    Alcohol/week: 3.0 standard drinks    Types: 3 Cans of beer per week    Comment: 3 12 oz cans of beer each week.None since 03/2014   Drug use: No    Home Medications Prior to Admission medications   Medication Sig Start Date End Date Taking? Authorizing Provider  amLODipine (NORVASC) 10 MG  tablet Take 1 tablet (10 mg total) by mouth daily. 08/25/17  Yes Kathie Dike, MD  aspirin 325 MG tablet Take 1 tablet (325 mg total) by mouth daily. 08/25/17  Yes Kathie Dike, MD  atorvastatin (LIPITOR) 40 MG tablet Take 40 mg by mouth daily.   Yes [provider]  metFORMIN (GLUCOPHAGE) 500 MG tablet Take 500 mg by mouth daily with breakfast.   Yes [provider]  naproxen (NAPROSYN) 500 MG tablet Take 1 tablet (500 mg total) by mouth 2 (two) times daily. 06/24/20  Yes Mourad Cwikla, Almyra Free, PA-C  telmisartan (MICARDIS) 40 MG tablet Take 40 mg by mouth daily.  10/16/18  Yes [provider]    Allergies    Patient has no known allergies.  Review of Systems   Review of Systems  Musculoskeletal: Positive for arthralgias and joint swelling.  Skin: Negative for wound.  Neurological: Negative for weakness and numbness.  All other systems reviewed and are negative.   Physical Exam Updated Vital Signs BP (!) 179/92 (BP Location: Right Arm)    Pulse 60    Temp 97.9 F (36.6 C) (Oral)    Resp 18    Ht 6' (1.829 m)    Wt 86.2 kg    SpO2 96%    BMI 25.77 kg/m   Physical Exam Vitals and nursing note reviewed.  Constitutional:      Appearance: He is well-developed.  HENT:     Head: Normocephalic.  Cardiovascular:     Rate and Rhythm: Normal rate.     Pulses: No decreased pulses.          Dorsalis pedis pulses are 2+ on the right side and 2+ on the left side.       Posterior tibial pulses are 2+ on the right side and 2+ on the left side.  Musculoskeletal:        General: Tenderness present.     Right ankle: Swelling present. No ecchymosis. Tenderness present over the lateral malleolus. No proximal fibula tenderness. Decreased range of motion. Normal pulse.     Right Achilles Tendon: Normal. No tenderness or defects.  Skin:    General: Skin is warm and dry.  Neurological:     Mental Status: He is alert.     Sensory: No sensory deficit.     ED Results /  Procedures / Treatments   Labs (all labs ordered are listed, but only abnormal results are displayed) Labs Reviewed - No data to display  EKG None  Radiology DG Ankle Complete Right  Result Date: 06/24/2020 CLINICAL DATA:  Acute right ankle pain without known injury. EXAM: RIGHT ANKLE - COMPLETE 3+ VIEW  COMPARISON:  None. FINDINGS: There is no evidence of fracture, dislocation, or joint effusion. There is no evidence of arthropathy or other focal bone abnormality. Soft tissues are unremarkable. IMPRESSION: Negative. Electronically Signed   By: Marijo Conception M.D.   On: 06/24/2020 15:04    Procedures Procedures   Medications Ordered in ED Medications - No data to display  ED Course  I have reviewed the triage vital signs and the nursing notes.  Pertinent labs & imaging results that were available during my care of the patient were reviewed by me and considered in my medical decision making (see chart for details).    MDM Rules/Calculators/A&P                          Imaging reviewed and negative.  Exam suggests ankle sprain although pt denies inversion injury during his walk.  He was placed in ASO, discussed RICE, recheck pcp in 1 week if not improving.   Final Clinical Impression(s) / ED Diagnoses Final diagnoses:  Acute right ankle pain    Rx / DC Orders ED Discharge Orders         Ordered    naproxen (NAPROSYN) 500 MG tablet  2 times daily        06/24/20 1522           Evalee Jefferson, PA-C 06/24/20 1525    Fredia Sorrow, MD 06/27/20 [phone]

## 2020-06-29 DIAGNOSIS — R5383 Other fatigue: Secondary | ICD-10-CM | POA: Diagnosis not present

## 2020-06-29 DIAGNOSIS — E1169 Type 2 diabetes mellitus with other specified complication: Secondary | ICD-10-CM | POA: Diagnosis not present

## 2020-06-29 DIAGNOSIS — R600 Localized edema: Secondary | ICD-10-CM | POA: Diagnosis not present

## 2020-06-29 DIAGNOSIS — R350 Frequency of micturition: Secondary | ICD-10-CM | POA: Diagnosis not present

## 2020-06-29 DIAGNOSIS — Z136 Encounter for screening for cardiovascular disorders: Secondary | ICD-10-CM | POA: Diagnosis not present

## 2020-07-02 DIAGNOSIS — Z8673 Personal history of transient ischemic attack (TIA), and cerebral infarction without residual deficits: Secondary | ICD-10-CM | POA: Diagnosis not present

## 2020-07-02 DIAGNOSIS — Z712 Person consulting for explanation of examination or test findings: Secondary | ICD-10-CM | POA: Diagnosis not present

## 2020-07-02 DIAGNOSIS — I129 Hypertensive chronic kidney disease with stage 1 through stage 4 chronic kidney disease, or unspecified chronic kidney disease: Secondary | ICD-10-CM | POA: Diagnosis not present

## 2020-07-02 DIAGNOSIS — E871 Hypo-osmolality and hyponatremia: Secondary | ICD-10-CM | POA: Diagnosis not present

## 2020-07-02 DIAGNOSIS — E1122 Type 2 diabetes mellitus with diabetic chronic kidney disease: Secondary | ICD-10-CM | POA: Diagnosis not present

## 2020-07-02 DIAGNOSIS — E782 Mixed hyperlipidemia: Secondary | ICD-10-CM | POA: Diagnosis not present

## 2020-07-02 DIAGNOSIS — N1831 Chronic kidney disease, stage 3a: Secondary | ICD-10-CM | POA: Diagnosis not present

## 2020-11-27 ENCOUNTER — Inpatient Hospital Stay (HOSPITAL_COMMUNITY)
Admission: EM | Admit: 2020-11-27 | Discharge: 2020-12-23 | DRG: 388 | Disposition: A | Discharge status: Skilled Nursing Facility | Payer: Medicare Other | Attending: Student | Admitting: Student

## 2020-11-27 ENCOUNTER — Other Ambulatory Visit: Payer: Self-pay

## 2020-11-27 ENCOUNTER — Encounter (HOSPITAL_COMMUNITY): Payer: Self-pay

## 2020-11-27 DIAGNOSIS — Z743 Need for continuous supervision: Secondary | ICD-10-CM | POA: Diagnosis not present

## 2020-11-27 DIAGNOSIS — I471 Supraventricular tachycardia: Secondary | ICD-10-CM | POA: Diagnosis not present

## 2020-11-27 DIAGNOSIS — R2689 Other abnormalities of gait and mobility: Secondary | ICD-10-CM | POA: Diagnosis not present

## 2020-11-27 DIAGNOSIS — I4891 Unspecified atrial fibrillation: Secondary | ICD-10-CM | POA: Diagnosis not present

## 2020-11-27 DIAGNOSIS — E1122 Type 2 diabetes mellitus with diabetic chronic kidney disease: Secondary | ICD-10-CM | POA: Diagnosis not present

## 2020-11-27 DIAGNOSIS — E785 Hyperlipidemia, unspecified: Secondary | ICD-10-CM | POA: Diagnosis present

## 2020-11-27 DIAGNOSIS — I498 Other specified cardiac arrhythmias: Secondary | ICD-10-CM | POA: Diagnosis not present

## 2020-11-27 DIAGNOSIS — Z781 Physical restraint status: Secondary | ICD-10-CM

## 2020-11-27 DIAGNOSIS — J690 Pneumonitis due to inhalation of food and vomit: Secondary | ICD-10-CM | POA: Diagnosis not present

## 2020-11-27 DIAGNOSIS — Z66 Do not resuscitate: Secondary | ICD-10-CM | POA: Diagnosis present

## 2020-11-27 DIAGNOSIS — N138 Other obstructive and reflux uropathy: Secondary | ICD-10-CM | POA: Diagnosis present

## 2020-11-27 DIAGNOSIS — I469 Cardiac arrest, cause unspecified: Secondary | ICD-10-CM | POA: Diagnosis not present

## 2020-11-27 DIAGNOSIS — I77810 Thoracic aortic ectasia: Secondary | ICD-10-CM | POA: Diagnosis present

## 2020-11-27 DIAGNOSIS — I462 Cardiac arrest due to underlying cardiac condition: Secondary | ICD-10-CM | POA: Diagnosis not present

## 2020-11-27 DIAGNOSIS — E119 Type 2 diabetes mellitus without complications: Secondary | ICD-10-CM | POA: Diagnosis not present

## 2020-11-27 DIAGNOSIS — G9340 Encephalopathy, unspecified: Secondary | ICD-10-CM | POA: Diagnosis present

## 2020-11-27 DIAGNOSIS — I214 Non-ST elevation (NSTEMI) myocardial infarction: Secondary | ICD-10-CM | POA: Diagnosis not present

## 2020-11-27 DIAGNOSIS — R001 Bradycardia, unspecified: Secondary | ICD-10-CM | POA: Diagnosis not present

## 2020-11-27 DIAGNOSIS — R197 Diarrhea, unspecified: Secondary | ICD-10-CM | POA: Diagnosis not present

## 2020-11-27 DIAGNOSIS — I5032 Chronic diastolic (congestive) heart failure: Secondary | ICD-10-CM | POA: Diagnosis not present

## 2020-11-27 DIAGNOSIS — G4733 Obstructive sleep apnea (adult) (pediatric): Secondary | ICD-10-CM | POA: Diagnosis present

## 2020-11-27 DIAGNOSIS — R338 Other retention of urine: Secondary | ICD-10-CM | POA: Diagnosis not present

## 2020-11-27 DIAGNOSIS — Z7189 Other specified counseling: Secondary | ICD-10-CM | POA: Diagnosis not present

## 2020-11-27 DIAGNOSIS — I130 Hypertensive heart and chronic kidney disease with heart failure and stage 1 through stage 4 chronic kidney disease, or unspecified chronic kidney disease: Secondary | ICD-10-CM | POA: Diagnosis present

## 2020-11-27 DIAGNOSIS — K449 Diaphragmatic hernia without obstruction or gangrene: Secondary | ICD-10-CM | POA: Diagnosis not present

## 2020-11-27 DIAGNOSIS — G9341 Metabolic encephalopathy: Secondary | ICD-10-CM | POA: Diagnosis present

## 2020-11-27 DIAGNOSIS — R579 Shock, unspecified: Secondary | ICD-10-CM | POA: Diagnosis not present

## 2020-11-27 DIAGNOSIS — N182 Chronic kidney disease, stage 2 (mild): Secondary | ICD-10-CM | POA: Diagnosis present

## 2020-11-27 DIAGNOSIS — R112 Nausea with vomiting, unspecified: Secondary | ICD-10-CM | POA: Diagnosis not present

## 2020-11-27 DIAGNOSIS — E872 Acidosis: Secondary | ICD-10-CM | POA: Diagnosis not present

## 2020-11-27 DIAGNOSIS — Z7982 Long term (current) use of aspirin: Secondary | ICD-10-CM

## 2020-11-27 DIAGNOSIS — Z794 Long term (current) use of insulin: Secondary | ICD-10-CM

## 2020-11-27 DIAGNOSIS — Z4682 Encounter for fitting and adjustment of non-vascular catheter: Secondary | ICD-10-CM | POA: Diagnosis not present

## 2020-11-27 DIAGNOSIS — G4730 Sleep apnea, unspecified: Secondary | ICD-10-CM | POA: Diagnosis not present

## 2020-11-27 DIAGNOSIS — E1169 Type 2 diabetes mellitus with other specified complication: Secondary | ICD-10-CM

## 2020-11-27 DIAGNOSIS — J9601 Acute respiratory failure with hypoxia: Secondary | ICD-10-CM | POA: Diagnosis present

## 2020-11-27 DIAGNOSIS — R1310 Dysphagia, unspecified: Secondary | ICD-10-CM | POA: Diagnosis not present

## 2020-11-27 DIAGNOSIS — K6389 Other specified diseases of intestine: Secondary | ICD-10-CM | POA: Diagnosis not present

## 2020-11-27 DIAGNOSIS — J90 Pleural effusion, not elsewhere classified: Secondary | ICD-10-CM | POA: Diagnosis not present

## 2020-11-27 DIAGNOSIS — R109 Unspecified abdominal pain: Secondary | ICD-10-CM | POA: Diagnosis not present

## 2020-11-27 DIAGNOSIS — G931 Anoxic brain damage, not elsewhere classified: Secondary | ICD-10-CM | POA: Diagnosis not present

## 2020-11-27 DIAGNOSIS — R630 Anorexia: Secondary | ICD-10-CM | POA: Diagnosis not present

## 2020-11-27 DIAGNOSIS — J9621 Acute and chronic respiratory failure with hypoxia: Secondary | ICD-10-CM | POA: Diagnosis not present

## 2020-11-27 DIAGNOSIS — J9602 Acute respiratory failure with hypercapnia: Secondary | ICD-10-CM | POA: Diagnosis not present

## 2020-11-27 DIAGNOSIS — R0602 Shortness of breath: Secondary | ICD-10-CM

## 2020-11-27 DIAGNOSIS — Z7984 Long term (current) use of oral hypoglycemic drugs: Secondary | ICD-10-CM

## 2020-11-27 DIAGNOSIS — Z7401 Bed confinement status: Secondary | ICD-10-CM | POA: Diagnosis not present

## 2020-11-27 DIAGNOSIS — K56600 Partial intestinal obstruction, unspecified as to cause: Principal | ICD-10-CM | POA: Diagnosis present

## 2020-11-27 DIAGNOSIS — M961 Postlaminectomy syndrome, not elsewhere classified: Secondary | ICD-10-CM | POA: Diagnosis present

## 2020-11-27 DIAGNOSIS — Z8249 Family history of ischemic heart disease and other diseases of the circulatory system: Secondary | ICD-10-CM

## 2020-11-27 DIAGNOSIS — E1165 Type 2 diabetes mellitus with hyperglycemia: Secondary | ICD-10-CM | POA: Diagnosis present

## 2020-11-27 DIAGNOSIS — N179 Acute kidney failure, unspecified: Secondary | ICD-10-CM | POA: Diagnosis present

## 2020-11-27 DIAGNOSIS — M47816 Spondylosis without myelopathy or radiculopathy, lumbar region: Secondary | ICD-10-CM | POA: Diagnosis not present

## 2020-11-27 DIAGNOSIS — R0600 Dyspnea, unspecified: Secondary | ICD-10-CM

## 2020-11-27 DIAGNOSIS — N401 Enlarged prostate with lower urinary tract symptoms: Secondary | ICD-10-CM | POA: Diagnosis present

## 2020-11-27 DIAGNOSIS — Z20822 Contact with and (suspected) exposure to covid-19: Secondary | ICD-10-CM | POA: Diagnosis present

## 2020-11-27 DIAGNOSIS — G3184 Mild cognitive impairment, so stated: Secondary | ICD-10-CM | POA: Diagnosis present

## 2020-11-27 DIAGNOSIS — K56609 Unspecified intestinal obstruction, unspecified as to partial versus complete obstruction: Secondary | ICD-10-CM | POA: Diagnosis present

## 2020-11-27 DIAGNOSIS — Z4659 Encounter for fitting and adjustment of other gastrointestinal appliance and device: Secondary | ICD-10-CM

## 2020-11-27 DIAGNOSIS — E876 Hypokalemia: Secondary | ICD-10-CM | POA: Diagnosis not present

## 2020-11-27 DIAGNOSIS — M6281 Muscle weakness (generalized): Secondary | ICD-10-CM | POA: Diagnosis not present

## 2020-11-27 DIAGNOSIS — I472 Ventricular tachycardia: Secondary | ICD-10-CM | POA: Diagnosis not present

## 2020-11-27 DIAGNOSIS — E871 Hypo-osmolality and hyponatremia: Secondary | ICD-10-CM | POA: Diagnosis not present

## 2020-11-27 DIAGNOSIS — I501 Left ventricular failure: Secondary | ICD-10-CM | POA: Diagnosis not present

## 2020-11-27 DIAGNOSIS — R319 Hematuria, unspecified: Secondary | ICD-10-CM | POA: Diagnosis not present

## 2020-11-27 DIAGNOSIS — G319 Degenerative disease of nervous system, unspecified: Secondary | ICD-10-CM | POA: Diagnosis not present

## 2020-11-27 DIAGNOSIS — Z978 Presence of other specified devices: Secondary | ICD-10-CM | POA: Diagnosis present

## 2020-11-27 DIAGNOSIS — Z0189 Encounter for other specified special examinations: Secondary | ICD-10-CM

## 2020-11-27 DIAGNOSIS — R1312 Dysphagia, oropharyngeal phase: Secondary | ICD-10-CM | POA: Diagnosis not present

## 2020-11-27 DIAGNOSIS — R092 Respiratory arrest: Secondary | ICD-10-CM | POA: Diagnosis not present

## 2020-11-27 DIAGNOSIS — R5381 Other malaise: Secondary | ICD-10-CM | POA: Diagnosis not present

## 2020-11-27 DIAGNOSIS — K5669 Other partial intestinal obstruction: Secondary | ICD-10-CM | POA: Diagnosis not present

## 2020-11-27 DIAGNOSIS — J9811 Atelectasis: Secondary | ICD-10-CM | POA: Diagnosis not present

## 2020-11-27 DIAGNOSIS — Z79899 Other long term (current) drug therapy: Secondary | ICD-10-CM

## 2020-11-27 DIAGNOSIS — I6782 Cerebral ischemia: Secondary | ICD-10-CM | POA: Diagnosis not present

## 2020-11-27 DIAGNOSIS — R404 Transient alteration of awareness: Secondary | ICD-10-CM | POA: Diagnosis not present

## 2020-11-27 DIAGNOSIS — K5900 Constipation, unspecified: Secondary | ICD-10-CM | POA: Diagnosis not present

## 2020-11-27 DIAGNOSIS — Z8673 Personal history of transient ischemic attack (TIA), and cerebral infarction without residual deficits: Secondary | ICD-10-CM

## 2020-11-27 DIAGNOSIS — R0689 Other abnormalities of breathing: Secondary | ICD-10-CM | POA: Diagnosis not present

## 2020-11-27 DIAGNOSIS — J019 Acute sinusitis, unspecified: Secondary | ICD-10-CM | POA: Diagnosis not present

## 2020-11-27 DIAGNOSIS — J9690 Respiratory failure, unspecified, unspecified whether with hypoxia or hypercapnia: Secondary | ICD-10-CM | POA: Diagnosis not present

## 2020-11-27 DIAGNOSIS — Z515 Encounter for palliative care: Secondary | ICD-10-CM | POA: Diagnosis not present

## 2020-11-27 DIAGNOSIS — R9431 Abnormal electrocardiogram [ECG] [EKG]: Secondary | ICD-10-CM | POA: Diagnosis not present

## 2020-11-27 DIAGNOSIS — Z87891 Personal history of nicotine dependence: Secondary | ICD-10-CM

## 2020-11-27 DIAGNOSIS — R0902 Hypoxemia: Secondary | ICD-10-CM | POA: Diagnosis not present

## 2020-11-27 DIAGNOSIS — I447 Left bundle-branch block, unspecified: Secondary | ICD-10-CM | POA: Diagnosis not present

## 2020-11-27 DIAGNOSIS — K219 Gastro-esophageal reflux disease without esophagitis: Secondary | ICD-10-CM | POA: Diagnosis present

## 2020-11-27 DIAGNOSIS — Z95828 Presence of other vascular implants and grafts: Secondary | ICD-10-CM

## 2020-11-27 HISTORY — DX: Personal history of pneumonia (recurrent): Z87.01

## 2020-11-27 HISTORY — DX: Type 2 diabetes mellitus without complications: E11.9

## 2020-11-27 HISTORY — DX: Ventricular tachycardia: I47.2

## 2020-11-27 HISTORY — DX: Personal history of transient ischemic attack (TIA), and cerebral infarction without residual deficits: Z86.73

## 2020-11-27 HISTORY — DX: Hypo-osmolality and hyponatremia: E87.1

## 2020-11-27 HISTORY — DX: Other ventricular tachycardia: I47.29

## 2020-11-27 LAB — URINALYSIS, ROUTINE W REFLEX MICROSCOPIC
Bilirubin Urine: NEGATIVE
Glucose, UA: >=500 mg/dL — AB
Ketones, ur: 20 mg/dL — AB
Leukocytes,Ua: NEGATIVE
Nitrite: NEGATIVE
Protein, ur: 30 mg/dL — AB
Specific Gravity, Urine: 1.016 (ref 1.005–1.030)
pH: 6 (ref 5.0–8.0)

## 2020-11-27 LAB — LIPASE, BLOOD: Lipase: 19 U/L (ref 11–51)

## 2020-11-27 LAB — CBC WITH DIFFERENTIAL/PLATELET
Abs Immature Granulocytes: 0.04 10*3/uL (ref 0.00–0.07)
Basophils Absolute: 0 10*3/uL (ref 0.0–0.1)
Basophils Relative: 0 %
Eosinophils Absolute: 0 10*3/uL (ref 0.0–0.5)
Eosinophils Relative: 0 %
HCT: 42.5 % (ref 39.0–52.0)
Hemoglobin: 14.8 g/dL (ref 13.0–17.0)
Immature Granulocytes: 0 %
Lymphocytes Relative: 9 %
Lymphs Abs: 1.3 10*3/uL (ref 0.7–4.0)
MCH: 31.1 pg (ref 26.0–34.0)
MCHC: 34.8 g/dL (ref 30.0–36.0)
MCV: 89.3 fL (ref 80.0–100.0)
Monocytes Absolute: 1.1 10*3/uL — ABNORMAL HIGH (ref 0.1–1.0)
Monocytes Relative: 8 %
Neutro Abs: 11.7 10*3/uL — ABNORMAL HIGH (ref 1.7–7.7)
Neutrophils Relative %: 83 %
Platelets: 232 10*3/uL (ref 150–400)
RBC: 4.76 MIL/uL (ref 4.22–5.81)
RDW: 12.9 % (ref 11.5–15.5)
WBC: 14.3 10*3/uL — ABNORMAL HIGH (ref 4.0–10.5)
nRBC: 0 % (ref 0.0–0.2)

## 2020-11-27 LAB — BASIC METABOLIC PANEL
Anion gap: 7 (ref 5–15)
BUN: 16 mg/dL (ref 8–23)
CO2: 23 mmol/L (ref 22–32)
Calcium: 8 mg/dL — ABNORMAL LOW (ref 8.9–10.3)
Chloride: 93 mmol/L — ABNORMAL LOW (ref 98–111)
Creatinine, Ser: 1.2 mg/dL (ref 0.61–1.24)
GFR, Estimated: >60 mL/min (ref >60)
Glucose, Bld: 232 mg/dL — ABNORMAL HIGH (ref 70–99)
Potassium: 3.7 mmol/L (ref 3.5–5.1)
Sodium: 123 mmol/L — ABNORMAL LOW (ref 135–145)

## 2020-11-27 LAB — SODIUM, URINE, RANDOM: Sodium, Ur: <10 mmol/L

## 2020-11-27 LAB — SODIUM
Sodium: 120 mmol/L — ABNORMAL LOW (ref 135–145)
Sodium: 121 mmol/L — ABNORMAL LOW (ref 135–145)

## 2020-11-27 LAB — GLUCOSE, CAPILLARY
Glucose-Capillary: 231 mg/dL — ABNORMAL HIGH (ref 70–99)
Glucose-Capillary: 148 mg/dL — ABNORMAL HIGH (ref 70–99)

## 2020-11-27 LAB — COMPREHENSIVE METABOLIC PANEL
ALT: 16 U/L (ref 0–44)
AST: 19 U/L (ref 15–41)
Albumin: 3.6 g/dL (ref 3.5–5.0)
Alkaline Phosphatase: 51 U/L (ref 38–126)
Anion gap: 9 (ref 5–15)
BUN: 20 mg/dL (ref 8–23)
CO2: 21 mmol/L — ABNORMAL LOW (ref 22–32)
Calcium: 8.7 mg/dL — ABNORMAL LOW (ref 8.9–10.3)
Chloride: 88 mmol/L — ABNORMAL LOW (ref 98–111)
Creatinine, Ser: 1.27 mg/dL — ABNORMAL HIGH (ref 0.61–1.24)
GFR, Estimated: 59 mL/min — ABNORMAL LOW (ref >60)
Glucose, Bld: 266 mg/dL — ABNORMAL HIGH (ref 70–99)
Potassium: 4 mmol/L (ref 3.5–5.1)
Sodium: 118 mmol/L — CL (ref 135–145)
Total Bilirubin: 1.8 mg/dL — ABNORMAL HIGH (ref 0.3–1.2)
Total Protein: 7 g/dL (ref 6.5–8.1)

## 2020-11-27 LAB — HEMOGLOBIN A1C
Hgb A1c MFr Bld: 8.6 % — ABNORMAL HIGH (ref 4.8–5.6)
Mean Plasma Glucose: 200.12 mg/dL

## 2020-11-27 LAB — MAGNESIUM: Magnesium: 1.8 mg/dL (ref 1.7–2.4)

## 2020-11-27 LAB — RESP PANEL BY RT-PCR (FLU A&B, COVID) ARPGX2
Influenza A by PCR: NEGATIVE
Influenza B by PCR: NEGATIVE
SARS Coronavirus 2 by RT PCR: NEGATIVE

## 2020-11-27 LAB — PHOSPHORUS: Phosphorus: 2 mg/dL — ABNORMAL LOW (ref 2.5–4.6)

## 2020-11-27 LAB — OSMOLALITY, URINE: Osmolality, Ur: 527 mOsm/kg (ref 300–900)

## 2020-11-27 LAB — CREATININE, URINE, RANDOM: Creatinine, Urine: 121.53 mg/dL

## 2020-11-27 MED ORDER — TRAZODONE HCL 50 MG PO TABS: 50.0000 mg | ORAL_TABLET | Freq: Every evening | ORAL | Status: DC | PRN

## 2020-11-27 MED ORDER — INSULIN ASPART 100 UNIT/ML IJ SOLN
0.0000 [IU] | Freq: Three times a day (TID) | INTRAMUSCULAR | Status: DC
  Administered 2020-11-27: 2 [IU] via SUBCUTANEOUS
  Administered 2020-11-28: 3 [IU] via SUBCUTANEOUS
  Administered 2020-11-28: 2 [IU] via SUBCUTANEOUS
  Administered 2020-11-28: 4 [IU] via SUBCUTANEOUS
  Administered 2020-11-29: 3 [IU] via SUBCUTANEOUS

## 2020-11-27 MED ORDER — SODIUM CHLORIDE 0.9% FLUSH
3.0000 mL | Freq: Two times a day (BID) | INTRAVENOUS | Status: DC
  Administered 2020-11-28 – 2020-11-29 (×2): 3 mL via INTRAVENOUS
  Administered 2020-11-29: 10 mL via INTRAVENOUS
  Administered 2020-11-30 – 2020-12-14 (×23): 3 mL via INTRAVENOUS

## 2020-11-27 MED ORDER — MAGNESIUM OXIDE -MG SUPPLEMENT 400 (240 MG) MG PO TABS
400.0000 mg | ORAL_TABLET | Freq: Every day | ORAL | Status: AC
  Administered 2020-11-28: 400 mg via ORAL
  Filled 2020-11-27: qty 1

## 2020-11-27 MED ORDER — SODIUM CHLORIDE 0.9% FLUSH
3.0000 mL | Freq: Two times a day (BID) | INTRAVENOUS | Status: DC
  Administered 2020-11-28 – 2020-12-19 (×31): 3 mL via INTRAVENOUS

## 2020-11-27 MED ORDER — SODIUM CHLORIDE 0.9 % IV BOLUS
1000.0000 mL | Freq: Once | INTRAVENOUS | Status: AC
  Administered 2020-11-27: 1000 mL via INTRAVENOUS

## 2020-11-27 MED ORDER — POLYETHYLENE GLYCOL 3350 17 G PO PACK
17.0000 g | PACK | Freq: Every day | ORAL | Status: DC | PRN
  Administered 2020-11-28: 17 g via ORAL
  Filled 2020-11-27: qty 1

## 2020-11-27 MED ORDER — SODIUM CHLORIDE 0.9 % IV SOLN
250.0000 mL | INTRAVENOUS | Status: DC | PRN
  Administered 2020-12-03: 250 mL via INTRAVENOUS

## 2020-11-27 MED ORDER — ONDANSETRON HCL 4 MG PO TABS: 4.0000 mg | ORAL_TABLET | Freq: Four times a day (QID) | ORAL | Status: DC | PRN

## 2020-11-27 MED ORDER — ADULT MULTIVITAMIN W/MINERALS CH
1.0000 | ORAL_TABLET | Freq: Every day | ORAL | Status: DC
  Administered 2020-11-28: 1 via ORAL
  Filled 2020-11-27: qty 1

## 2020-11-27 MED ORDER — ACETAMINOPHEN 650 MG RE SUPP
650.0000 mg | Freq: Four times a day (QID) | RECTAL | Status: DC | PRN
Start: 2020-11-27 — End: 2020-11-30
  Administered 2020-11-30: 650 mg via RECTAL
  Filled 2020-11-27: qty 1

## 2020-11-27 MED ORDER — AMLODIPINE BESYLATE 5 MG PO TABS
10.0000 mg | ORAL_TABLET | Freq: Every day | ORAL | Status: DC
  Administered 2020-11-28: 10 mg via ORAL
  Filled 2020-11-27: qty 2

## 2020-11-27 MED ORDER — ONDANSETRON HCL 4 MG/2ML IJ SOLN
4.0000 mg | Freq: Once | INTRAMUSCULAR | Status: AC
  Administered 2020-11-27: 4 mg via INTRAVENOUS
  Filled 2020-11-27: qty 2

## 2020-11-27 MED ORDER — INSULIN ASPART 100 UNIT/ML IJ SOLN
0.0000 [IU] | Freq: Every day | INTRAMUSCULAR | Status: DC
Start: 2020-11-27 — End: 2020-11-30
  Administered 2020-11-27: 3 [IU] via SUBCUTANEOUS
  Administered 2020-11-28: 2 [IU] via SUBCUTANEOUS

## 2020-11-27 MED ORDER — IRBESARTAN 75 MG PO TABS
75.0000 mg | ORAL_TABLET | Freq: Every day | ORAL | Status: DC
  Administered 2020-11-28: 75 mg via ORAL
  Filled 2020-11-27: qty 1

## 2020-11-27 MED ORDER — ATORVASTATIN CALCIUM 40 MG PO TABS
40.0000 mg | ORAL_TABLET | Freq: Every day | ORAL | Status: DC
  Administered 2020-11-28: 40 mg via ORAL
  Filled 2020-11-27: qty 1

## 2020-11-27 MED ORDER — ENOXAPARIN SODIUM 40 MG/0.4ML IJ SOSY
40.0000 mg | PREFILLED_SYRINGE | INTRAMUSCULAR | Status: DC
  Administered 2020-11-27 – 2020-11-29 (×3): 40 mg via SUBCUTANEOUS
  Filled 2020-11-27 (×3): qty 0.4

## 2020-11-27 MED ORDER — METFORMIN HCL 500 MG PO TABS
500.0000 mg | ORAL_TABLET | Freq: Every day | ORAL | Status: DC
  Filled 2020-11-27: qty 1

## 2020-11-27 MED ORDER — ACETAMINOPHEN 325 MG PO TABS
650.0000 mg | ORAL_TABLET | Freq: Four times a day (QID) | ORAL | Status: DC | PRN
  Administered 2020-11-27: 650 mg via ORAL
  Filled 2020-11-27: qty 2

## 2020-11-27 MED ORDER — ASPIRIN 325 MG PO TABS
325.0000 mg | ORAL_TABLET | Freq: Every day | ORAL | Status: DC
  Administered 2020-11-28: 325 mg via ORAL
  Filled 2020-11-27: qty 1

## 2020-11-27 MED ORDER — ONDANSETRON HCL 4 MG/2ML IJ SOLN
4.0000 mg | Freq: Four times a day (QID) | INTRAMUSCULAR | Status: DC | PRN
  Administered 2020-11-27: 4 mg via INTRAVENOUS
  Filled 2020-11-27: qty 2

## 2020-11-27 MED ORDER — K PHOS MONO-SOD PHOS DI & MONO 155-852-130 MG PO TABS
500.0000 mg | ORAL_TABLET | Freq: Every day | ORAL | Status: AC
  Administered 2020-11-28: 500 mg via ORAL
  Filled 2020-11-27: qty 2

## 2020-11-27 MED ORDER — SODIUM CHLORIDE 0.9 % IV SOLN: INTRAVENOUS | Status: DC

## 2020-11-27 MED ORDER — BISACODYL 10 MG RE SUPP
10.0000 mg | Freq: Every day | RECTAL | Status: DC | PRN
  Administered 2020-11-28 – 2020-11-29 (×2): 10 mg via RECTAL
  Filled 2020-11-27 (×2): qty 1

## 2020-11-27 MED ORDER — SODIUM CHLORIDE 0.9% FLUSH
3.0000 mL | INTRAVENOUS | Status: DC | PRN
  Administered 2020-12-02 – 2020-12-08 (×2): 3 mL via INTRAVENOUS

## 2020-11-27 NOTE — ED Notes (Signed)
Pt taken to ICU room 6 staff at bedside.

## 2020-11-27 NOTE — ED Triage Notes (Signed)
Pt says he is having COVID symptoms for the last 2 days, nausea and diarrhea.  Pt alert and oriented, skin warm and dry.

## 2020-11-27 NOTE — ED Notes (Signed)
Attempted report, nurse with another pt and will call back.

## 2020-11-27 NOTE — ED Provider Notes (Signed)
[facility] EMERGENCY DEPARTMENT Provider Note   CSN: [id number] Arrival date & time: 11/27/20  [id number]     History No chief complaint on file.   Albert Garrison is a 75 y.o. male.  HPI Patient is a 75 year old male with a history of CKD, BPH, diabetes mellitus, GERD, who presents to the emergency department due to diarrhea.  Patient states for the past few days he has been experiencing decreased appetite, nausea, poor p.o. intake.  He states that he was constipated for the last few days but last night began experiencing intermittent diarrhea.  Denies any hematochezia.  No vomiting.  No fevers, chills, cough, sore throat, rhinorrhea.  No dysuria or urinary frequency.  States he has been vaccinated for COVID-19 x4.  Denies any known history of previous COVID-19 infections.    Past Medical History:  Diagnosis Date   Arthritis    BPH (benign prostatic hyperplasia)    CKD (chronic kidney disease), stage II    Complicated UTI (urinary tract infection) Dec.2015   Depression    hx. of   Diabetes mellitus without complication (HCC)    diet controlled   Foley catheter in place 03/20/2014   GERD (gastroesophageal reflux disease)    occasionally   Heart murmur    hx. of at 17 yrs. old   HOH (hard of hearing)    Hypertension    Lower GI bleed 2017   a. ? due to polyp.   Pneumonia    hx. of   Rheumatic fever    when 4 yrs. old   Sleep apnea    does not use c-pap machine   Sodium (Na) deficiency    managed in nursing center12/2015-Feb-2016    Patient Active Problem List   Diagnosis Date Noted   Acute CVA (cerebrovascular accident) ([place]) 08/24/2017   Altered mental status 08/23/2017   Sleep apnea 08/23/2017   Elevated troponin 08/23/2017   Abnormal CT of the head 08/23/2017   Hypertension 08/23/2017   Type 2 diabetes mellitus ([place]) 08/23/2017   Syncope 08/23/2017   S/P carpal tunnel release right 07/13/17 07/27/2017   AKI (acute kidney injury) ([place]) 11/29/2015   Lower GI bleed  11/28/2015   Rectal bleed 11/28/2015   Rectal bleeding 11/28/2015   Benign prostatic hypertrophy with urinary retention 01/20/2015   Urinary retention 05/21/2014   ARF (acute renal failure) ([place]) 03/26/2014   IDDM (insulin dependent diabetes mellitus) ([place]) 03/26/2014   UTI (lower urinary tract infection) 03/24/2014   Hyponatremia 03/24/2014   Acute encephalopathy 03/24/2014    Past Surgical History:  Procedure Laterality Date   CARPAL TUNNEL RELEASE Left 2010   CARPAL TUNNEL RELEASE Right 07/13/2017   Procedure: CARPAL TUNNEL RELEASE;  Surgeon: Carole Civil, MD;  Location: [facility];  Service: Orthopedics;  Laterality: Right;   CERVICAL SPINE SURGERY  2010   COLONOSCOPY WITH PROPOFOL N/A 11/30/2015   Procedure: COLONOSCOPY WITH PROPOFOL;  Surgeon: Daneil Dolin, MD;  Location: [facility];  Service: Endoscopy;  Laterality: N/A;   KNEE ARTHROSCOPY  [place] surgery - broken nose     POLYPECTOMY  11/30/2015   Procedure: POLYPECTOMY;  Surgeon: Daneil Dolin, MD;  Location: [facility];  Service: Endoscopy;;  polyp at ascending colon, rectal polyp   TRANSURETHRAL INCISION OF PROSTATE N/A 01/20/2015   Procedure: TRANSURETHRAL INCISION OF THE PROSTATE (TUIP);  Surgeon: Irine Seal, MD;  Location: [facility];  Service: Urology;  Laterality: N/A;  Family History  Problem Relation Age of Onset   Heart disease Sister    Hypertension Sister     Social History   Tobacco Use   Smoking status: Former    Packs/day: 1.50    Years: 14.00    Pack years: 21.00    Types: Cigarettes    Quit date: 08/06/1975    Years since quitting: 45.3   Smokeless tobacco: Never  Vaping Use   Vaping Use: Never used  Substance Use Topics   Alcohol use: Yes    Alcohol/week: 3.0 standard drinks    Types: 3 Cans of beer per week    Comment: 3 12 oz cans of beer each week.None since 03/2014   Drug use: No    Home Medications Prior to Admission medications    Medication Sig Start Date End Date Taking? Authorizing Provider  amLODipine (NORVASC) 10 MG tablet Take 1 tablet (10 mg total) by mouth daily. 08/25/17   Kathie Dike, MD  aspirin 325 MG tablet Take 1 tablet (325 mg total) by mouth daily. 08/25/17   Kathie Dike, MD  atorvastatin (LIPITOR) 40 MG tablet Take 40 mg by mouth daily.    [provider]  metFORMIN (GLUCOPHAGE) 500 MG tablet Take 500 mg by mouth daily with breakfast.    [provider]  naproxen (NAPROSYN) 500 MG tablet Take 1 tablet (500 mg total) by mouth 2 (two) times daily. 06/24/20   Evalee Jefferson, PA-C  telmisartan (MICARDIS) 40 MG tablet Take 40 mg by mouth daily.  10/16/18   [provider]    Allergies    Patient has no known allergies.  Review of Systems   Review of Systems  All other systems reviewed and are negative. Ten systems reviewed and are negative for acute change, except as noted in the HPI.   Physical Exam Updated Vital Signs BP (!) 146/93 (BP Location: Right Arm)   Pulse 70   Temp 97.8 F (36.6 C) (Oral)   Resp 18   Ht 6' (1.829 m)   Wt 87 kg   SpO2 98%   BMI 26.01 kg/m   Physical Exam Vitals and nursing note reviewed.  Constitutional:      General: He is not in acute distress.    Appearance: Normal appearance. He is not ill-appearing, toxic-appearing or diaphoretic.  HENT:     Head: Normocephalic and atraumatic.     Right Ear: External ear normal.     Left Ear: External ear normal.     Nose: Nose normal.     Mouth/Throat:     Mouth: Mucous membranes are moist.     Pharynx: Oropharynx is clear. No oropharyngeal exudate or posterior oropharyngeal erythema.  Eyes:     Extraocular Movements: Extraocular movements intact.  Cardiovascular:     Rate and Rhythm: Normal rate and regular rhythm.     Pulses: Normal pulses.     Heart sounds: Normal heart sounds. No murmur heard.   No friction rub. No gallop.  Pulmonary:     Effort: Pulmonary effort is normal. No  respiratory distress.     Breath sounds: Normal breath sounds. No stridor. No wheezing, rhonchi or rales.  Abdominal:     General: Abdomen is flat.     Palpations: Abdomen is soft.     Tenderness: There is abdominal tenderness.     Comments: Protuberant abdomen that is soft.  Diffuse tenderness appreciated along the abdomen that appears to be worst in the left lower quadrant and  right upper quadrant.  Musculoskeletal:        General: Normal range of motion.     Cervical back: Normal range of motion and neck supple. No tenderness.  Skin:    General: Skin is warm and dry.  Neurological:     General: No focal deficit present.     Mental Status: He is alert and oriented to person, place, and time.  Psychiatric:        Mood and Affect: Mood normal.        Behavior: Behavior normal.   ED Results / Procedures / Treatments   Labs (all labs ordered are listed, but only abnormal results are displayed) Labs Reviewed  COMPREHENSIVE METABOLIC PANEL - Abnormal; Notable for the following components:      Result Value   Sodium 118 (*)    Chloride 88 (*)    CO2 21 (*)    Glucose, Bld 266 (*)    Creatinine, Ser 1.27 (*)    Calcium 8.7 (*)    Total Bilirubin 1.8 (*)    GFR, Estimated 59 (*)    All other components within normal limits  CBC WITH DIFFERENTIAL/PLATELET - Abnormal; Notable for the following components:   WBC 14.3 (*)    Neutro Abs 11.7 (*)    Monocytes Absolute 1.1 (*)    All other components within normal limits  URINALYSIS, ROUTINE W REFLEX MICROSCOPIC - Abnormal; Notable for the following components:   Glucose, UA >=500 (*)    Hgb urine dipstick SMALL (*)    Ketones, ur 20 (*)    Protein, ur 30 (*)    Bacteria, UA RARE (*)    All other components within normal limits  RESP PANEL BY RT-PCR (FLU A&B, COVID) ARPGX2  LIPASE, BLOOD  SODIUM, URINE, RANDOM  CREATININE, URINE, RANDOM  OSMOLALITY, URINE   EKG None  Radiology No results found.  Procedures Procedures    Medications Ordered in ED Medications  sodium chloride 0.9 % bolus 1,000 mL (1,000 mLs Intravenous New Bag/Given 11/27/20 0946)  ondansetron (ZOFRAN) injection 4 mg (4 mg Intravenous Given 11/27/20 0946)   ED Course  I have reviewed the triage vital signs and the nursing notes.  Pertinent labs & imaging results that were available during my care of the patient were reviewed by me and considered in my medical decision making (see chart for details).  Clinical Course as of 11/27/20 1053  Fri Nov 27, 2020  [id number] Sodium(!!): 118 [LJ]    Clinical Course User Index [LJ] Rayna Sexton, PA-C   MDM Rules/Calculators/A&P                          Pt is a 75 y.o. male who presents to the emergency department due to decreased p.o. intake, nausea, diarrhea.  Labs: CBC with a white count of 14.3, neutrophils of 11.7, monocytes of 1.1. CMP with a sodium of 118, chloride of 88, CO2 21, glucose of 266, creatinine of 1.27, calcium of 8.7, total bilirubin of 1.8, GFR 59. UA showing glucosuria greater than 500, small hemoglobin, 20 ketones, 30 protein, rare bacteria. Lipase within normal limits at 19. Respiratory panel is negative. Urine creatinine, urine sodium, and urine osmolality are pending.  I, Rayna Sexton, PA-C, personally reviewed and evaluated these images and lab results as part of my medical decision-making.  Patient found to be hyponatremic at 118 at this visit.  Urine osmolality, sodium, and creatinine are pending.  Unsure of  the source.  Possibly secondary to hypovolemia given patient's recent decreased p.o. intake.  It appears that he had an admission for hyponatremia in 2015 that at that time was thought to be secondary to hypovolemia as well.  He is not on diuretics.   Feel the patient will require admission for further management.  We will discussed with the medicine team.  Note: Portions of this report may have been transcribed using voice recognition software. Every effort  was made to ensure accuracy; however, inadvertent computerized transcription errors may be present.   Final Clinical Impression(s) / ED Diagnoses Final diagnoses:  Hyponatremia   Rx / DC Orders ED Discharge Orders     None        Rayna Sexton, PA-C 11/27/20 [place]    Davonna Belling, MD 11/28/20 [phone]

## 2020-11-27 NOTE — H&P (Signed)
Patient Demographics:    Albert Garrison, is a 75 y.o. male  MRN: [id number]   DOB - [birth year]  Admit Date - 11/27/2020  Outpatient Primary MD for the patient is Celene Squibb, MD   Assessment & Plan:    Principal Problem:   Hyponatremia Active Problems:   Acute encephalopathy   Type 2 diabetes mellitus (HCC)    1) symptomatic hyponatremia--- suspect due to diarrhea/GI losses sodium is 118 -Hydrate gently,  --serial BMP/sodium levels -chloride of 88, anion gap was 9, bicarb is 21  2)DM2 -A1C is 8.6--- reflecting uncontrolled DM with hyperglycemia PTA continue metformin Use Novolog/Humalog Sliding scale insulin with Accu-Cheks/Fingersticks as ordered   3)Social/Ethics--plan of care discussed with patient and patient's aunt Ms. Abundio Miu . --patient is a full code  4) history of recurrent AKI's----no definite CKD - renally adjust medications, avoid nephrotoxic agents / dehydration  / hypotension  5)HTN-stable, continue Avapro along with amlodipine  Disposition/Need for in-Hospital Stay- patient unable to be discharged at this time due to -symptomatic hyponatremia requiring IV fluids and close monitoring  Dispo: The patient is from: Home              Anticipated d/c is to: Home              Anticipated d/c date is: 1 day              Patient currently is not medically stable to d/c. Barriers: Not Clinically Stable-    With History of - Reviewed by me  Past Medical History:  Diagnosis Date   Arthritis    BPH (benign prostatic hyperplasia)    CKD (chronic kidney disease), stage II    Complicated UTI (urinary tract infection) Dec.2015   Depression    hx. of   Diabetes mellitus without complication (HCC)    diet controlled   Foley catheter in place 03/20/2014   GERD (gastroesophageal reflux  disease)    occasionally   Heart murmur    hx. of at 17 yrs. old   HOH (hard of hearing)    Hypertension    Lower GI bleed 2017   a. ? due to polyp.   Pneumonia    hx. of   Rheumatic fever    when 4 yrs. old   Sleep apnea    does not use c-pap machine   Sodium (Na) deficiency    managed in nursing center12/2015-Feb-2016      Past Surgical History:  Procedure Laterality Date   CARPAL TUNNEL RELEASE Left 2010   CARPAL TUNNEL RELEASE Right 07/13/2017   Procedure: CARPAL TUNNEL RELEASE;  Surgeon: Carole Civil, MD;  Location: [facility];  Service: Orthopedics;  Laterality: Right;   CERVICAL SPINE SURGERY  2010   COLONOSCOPY WITH PROPOFOL N/A 11/30/2015   Procedure: COLONOSCOPY WITH PROPOFOL;  Surgeon: Daneil Dolin, MD;  [facility];  Service: Endoscopy;  Laterality: N/A;   KNEE  ARTHROSCOPY  [place]   Nose surgery - broken nose     POLYPECTOMY  11/30/2015   Procedure: POLYPECTOMY;  Surgeon: Daneil Dolin, MD;  [facility];  Service: Endoscopy;;  polyp at ascending colon, rectal polyp   TRANSURETHRAL INCISION OF PROSTATE N/A 01/20/2015   Procedure: TRANSURETHRAL INCISION OF THE PROSTATE (TUIP);  Surgeon: Irine Seal, MD;  Location: [facility];  Service: Urology;  Laterality: N/A;     CC Confusion and diarrhea     HPI:    Albert Garrison  is a 75 y.o. male  of  BPH, diabetes mellitus, GERD presents to the ED with generalized weakness, nausea without vomiting and diarrhea of 24-hour duration -Stools are without blood or mucus No fever  Or chills  -Additional history obtained from patient's aunt Ms. Abundio Miu at bedside--- patient apparently was somewhat confused and disoriented -In the ED was found to have a sodium of 118, with a creatinine of 1.27 -Glucose was elevated at 266 with a bicarb of 21 and a chloride of 88, anion gap was 9 -WBC 14.3, lipase is not elevated, A1c 8.6 -UA with glucose ketones and protein no evidence of  UTI    Review of systems:    In addition to the HPI above,   A full Review of  Systems was done, all other systems reviewed are negative except as noted above in HPI , .    Social History:  Reviewed by me    Social History   Tobacco Use   Smoking status: Former    Packs/day: 1.50    Years: 14.00    Pack years: 21.00    Types: Cigarettes    Quit date: 08/06/1975    Years since quitting: 45.3   Smokeless tobacco: Never  Substance Use Topics   Alcohol use: Yes    Alcohol/week: 3.0 standard drinks    Types: 3 Cans of beer per week    Comment: 3 12 oz cans of beer each week.None since 03/2014       Family History :  Reviewed by me    Family History  Problem Relation Age of Onset   Heart disease Sister    Hypertension Sister       Home Medications:   Prior to Admission medications   Medication Sig Start Date End Date Taking? Authorizing Provider  amLODipine (NORVASC) 10 MG tablet Take 1 tablet (10 mg total) by mouth daily. 08/25/17   Kathie Dike, MD  aspirin 325 MG tablet Take 1 tablet (325 mg total) by mouth daily. 08/25/17   Kathie Dike, MD  atorvastatin (LIPITOR) 40 MG tablet Take 40 mg by mouth daily.    [provider]  metFORMIN (GLUCOPHAGE) 500 MG tablet Take 500 mg by mouth daily with breakfast.    [provider]  naproxen (NAPROSYN) 500 MG tablet Take 1 tablet (500 mg total) by mouth 2 (two) times daily. 06/24/20   Evalee Jefferson, PA-C  telmisartan (MICARDIS) 40 MG tablet Take 40 mg by mouth daily.  10/16/18   [provider]     Allergies:    No Known Allergies   Physical Exam:   Vitals  Blood pressure (!) 134/52, pulse 60, temperature 97.9 F (36.6 C), temperature source Oral, resp. rate 14, height '5\' 11"'$  (1.803 m), weight 87.2 kg, SpO2 97 %.  Physical Examination: General appearance - alert,  and in no distress  Mental status - alert, oriented to person, place,  and time,  Eyes - sclera anicteric Ears---HOH Neck -  supple, no JVD elevation , Chest - clear  to auscultation bilaterally, symmetrical air movement,  Heart - S1 and S2 normal, regular  Abdomen - soft, nontender, nondistended, no masses or organomegaly Neurological -episodes of confusion and disorientation, neck supple without rigidity, cranial nerves II through XII intact, DTR's normal and symmetric Extremities - no pedal edema noted, intact peripheral pulses  Skin - warm, dry     Data Review:    CBC Recent Labs  Lab 11/27/20 0943  WBC 14.3*  HGB 14.8  HCT 42.5  PLT 232  MCV 89.3  MCH 31.1  MCHC 34.8  RDW 12.9  LYMPHSABS 1.3  MONOABS 1.1*  EOSABS 0.0  BASOSABS 0.0   ------------------------------------------------------------------------------------------------------------------  Chemistries  Recent Labs  Lab 11/27/20 0943 11/27/20 1250  NA 118* 120*  K 4.0  --   CL 88*  --   CO2 21*  --   GLUCOSE 266*  --   BUN 20  --   CREATININE 1.27*  --   CALCIUM 8.7*  --   AST 19  --   ALT 16  --   ALKPHOS 51  --   BILITOT 1.8*  --    ------------------------------------------------------------------------------------------------------------------ estimated creatinine clearance is 52.7 mL/min (A) (by C-G formula based on SCr of 1.27 mg/dL (H)). ------------------------------------------------------------------------------------------------------------------ No results for input(s): TSH, T4TOTAL, T3FREE, THYROIDAB in the last 72 hours.  Invalid input(s): FREET3   Coagulation profile No results for input(s): INR, PROTIME in the last 168 hours. ------------------------------------------------------------------------------------------------------------------- No results for input(s): DDIMER in the last 72 hours. -------------------------------------------------------------------------------------------------------------------  Cardiac Enzymes No results for input(s): CKMB, TROPONINI, MYOGLOBIN in the last 168  hours.  Invalid input(s): CK ------------------------------------------------------------------------------------------------------------------ No results found for: BNP   ---------------------------------------------------------------------------------------------------------------  Urinalysis    Component Value Date/Time   COLORURINE YELLOW 11/27/2020 [place] 11/27/2020 1035   LABSPEC 1.016 11/27/2020 1035   PHURINE 6.0 11/27/2020 1035   GLUCOSEU >=500 (A) 11/27/2020 1035   HGBUR SMALL (A) 11/27/2020 1035   BILIRUBINUR NEGATIVE 11/27/2020 1035   KETONESUR 20 (A) 11/27/2020 1035   PROTEINUR 30 (A) 11/27/2020 1035   UROBILINOGEN 0.2 03/24/2014 1320   NITRITE NEGATIVE 11/27/2020 1035   LEUKOCYTESUR NEGATIVE 11/27/2020 1035     Imaging Results:    No results found.  Radiological Exams on Admission: No results found.  DVT Prophylaxis -SCD /lovenox AM Labs Ordered, also please review Full Orders  Family Communication: Admission, patients condition and plan of care including tests being ordered have been discussed with the patient and Kathryne Sharper who indicate understanding and agree with the plan   Code Status - Full Code  Likely DC to  home  Condition   stable  Roxan Hockey M.D on 11/27/2020 at 6:56 PM Go to [email or web address] -  for contact info  [facility] - Office  [phone]

## 2020-11-27 NOTE — Progress Notes (Signed)
Albert Garrison, the patient's aunt is taking his wallet home with her.

## 2020-11-28 ENCOUNTER — Observation Stay (HOSPITAL_COMMUNITY): Payer: Medicare Other

## 2020-11-28 DIAGNOSIS — E1165 Type 2 diabetes mellitus with hyperglycemia: Secondary | ICD-10-CM | POA: Diagnosis present

## 2020-11-28 DIAGNOSIS — E872 Acidosis: Secondary | ICD-10-CM | POA: Diagnosis not present

## 2020-11-28 DIAGNOSIS — I471 Supraventricular tachycardia: Secondary | ICD-10-CM | POA: Diagnosis not present

## 2020-11-28 DIAGNOSIS — J9601 Acute respiratory failure with hypoxia: Secondary | ICD-10-CM | POA: Diagnosis not present

## 2020-11-28 DIAGNOSIS — J019 Acute sinusitis, unspecified: Secondary | ICD-10-CM | POA: Diagnosis not present

## 2020-11-28 DIAGNOSIS — R0689 Other abnormalities of breathing: Secondary | ICD-10-CM | POA: Diagnosis not present

## 2020-11-28 DIAGNOSIS — J9602 Acute respiratory failure with hypercapnia: Secondary | ICD-10-CM | POA: Diagnosis not present

## 2020-11-28 DIAGNOSIS — K5900 Constipation, unspecified: Secondary | ICD-10-CM | POA: Diagnosis not present

## 2020-11-28 DIAGNOSIS — G931 Anoxic brain damage, not elsewhere classified: Secondary | ICD-10-CM | POA: Diagnosis not present

## 2020-11-28 DIAGNOSIS — R0902 Hypoxemia: Secondary | ICD-10-CM | POA: Diagnosis not present

## 2020-11-28 DIAGNOSIS — I472 Ventricular tachycardia: Secondary | ICD-10-CM | POA: Diagnosis not present

## 2020-11-28 DIAGNOSIS — R338 Other retention of urine: Secondary | ICD-10-CM | POA: Diagnosis not present

## 2020-11-28 DIAGNOSIS — J9811 Atelectasis: Secondary | ICD-10-CM | POA: Diagnosis not present

## 2020-11-28 DIAGNOSIS — G319 Degenerative disease of nervous system, unspecified: Secondary | ICD-10-CM | POA: Diagnosis not present

## 2020-11-28 DIAGNOSIS — K56609 Unspecified intestinal obstruction, unspecified as to partial versus complete obstruction: Secondary | ICD-10-CM | POA: Diagnosis not present

## 2020-11-28 DIAGNOSIS — I469 Cardiac arrest, cause unspecified: Secondary | ICD-10-CM | POA: Diagnosis not present

## 2020-11-28 DIAGNOSIS — J90 Pleural effusion, not elsewhere classified: Secondary | ICD-10-CM | POA: Diagnosis not present

## 2020-11-28 DIAGNOSIS — N182 Chronic kidney disease, stage 2 (mild): Secondary | ICD-10-CM | POA: Diagnosis present

## 2020-11-28 DIAGNOSIS — K6389 Other specified diseases of intestine: Secondary | ICD-10-CM | POA: Diagnosis not present

## 2020-11-28 DIAGNOSIS — E1122 Type 2 diabetes mellitus with diabetic chronic kidney disease: Secondary | ICD-10-CM | POA: Diagnosis not present

## 2020-11-28 DIAGNOSIS — R109 Unspecified abdominal pain: Secondary | ICD-10-CM | POA: Diagnosis not present

## 2020-11-28 DIAGNOSIS — R579 Shock, unspecified: Secondary | ICD-10-CM | POA: Diagnosis not present

## 2020-11-28 DIAGNOSIS — Z66 Do not resuscitate: Secondary | ICD-10-CM | POA: Diagnosis present

## 2020-11-28 DIAGNOSIS — I77810 Thoracic aortic ectasia: Secondary | ICD-10-CM | POA: Diagnosis present

## 2020-11-28 DIAGNOSIS — I5032 Chronic diastolic (congestive) heart failure: Secondary | ICD-10-CM | POA: Diagnosis present

## 2020-11-28 DIAGNOSIS — I6782 Cerebral ischemia: Secondary | ICD-10-CM | POA: Diagnosis not present

## 2020-11-28 DIAGNOSIS — N401 Enlarged prostate with lower urinary tract symptoms: Secondary | ICD-10-CM | POA: Diagnosis not present

## 2020-11-28 DIAGNOSIS — Z515 Encounter for palliative care: Secondary | ICD-10-CM | POA: Diagnosis not present

## 2020-11-28 DIAGNOSIS — K5669 Other partial intestinal obstruction: Secondary | ICD-10-CM | POA: Diagnosis not present

## 2020-11-28 DIAGNOSIS — G9341 Metabolic encephalopathy: Secondary | ICD-10-CM | POA: Diagnosis present

## 2020-11-28 DIAGNOSIS — I462 Cardiac arrest due to underlying cardiac condition: Secondary | ICD-10-CM | POA: Diagnosis not present

## 2020-11-28 DIAGNOSIS — R9431 Abnormal electrocardiogram [ECG] [EKG]: Secondary | ICD-10-CM

## 2020-11-28 DIAGNOSIS — K449 Diaphragmatic hernia without obstruction or gangrene: Secondary | ICD-10-CM | POA: Diagnosis not present

## 2020-11-28 DIAGNOSIS — K56600 Partial intestinal obstruction, unspecified as to cause: Secondary | ICD-10-CM | POA: Diagnosis present

## 2020-11-28 DIAGNOSIS — R001 Bradycardia, unspecified: Secondary | ICD-10-CM | POA: Diagnosis not present

## 2020-11-28 DIAGNOSIS — G9340 Encephalopathy, unspecified: Secondary | ICD-10-CM | POA: Diagnosis not present

## 2020-11-28 DIAGNOSIS — J9690 Respiratory failure, unspecified, unspecified whether with hypoxia or hypercapnia: Secondary | ICD-10-CM | POA: Diagnosis not present

## 2020-11-28 DIAGNOSIS — E871 Hypo-osmolality and hyponatremia: Secondary | ICD-10-CM | POA: Diagnosis present

## 2020-11-28 DIAGNOSIS — J690 Pneumonitis due to inhalation of food and vomit: Secondary | ICD-10-CM | POA: Diagnosis not present

## 2020-11-28 DIAGNOSIS — R112 Nausea with vomiting, unspecified: Secondary | ICD-10-CM | POA: Diagnosis not present

## 2020-11-28 DIAGNOSIS — R197 Diarrhea, unspecified: Secondary | ICD-10-CM | POA: Diagnosis not present

## 2020-11-28 DIAGNOSIS — Z7189 Other specified counseling: Secondary | ICD-10-CM | POA: Diagnosis not present

## 2020-11-28 DIAGNOSIS — N179 Acute kidney failure, unspecified: Secondary | ICD-10-CM | POA: Diagnosis present

## 2020-11-28 DIAGNOSIS — R0600 Dyspnea, unspecified: Secondary | ICD-10-CM | POA: Diagnosis not present

## 2020-11-28 DIAGNOSIS — Z4682 Encounter for fitting and adjustment of non-vascular catheter: Secondary | ICD-10-CM | POA: Diagnosis not present

## 2020-11-28 DIAGNOSIS — I501 Left ventricular failure: Secondary | ICD-10-CM | POA: Diagnosis not present

## 2020-11-28 DIAGNOSIS — E1169 Type 2 diabetes mellitus with other specified complication: Secondary | ICD-10-CM | POA: Diagnosis not present

## 2020-11-28 DIAGNOSIS — I214 Non-ST elevation (NSTEMI) myocardial infarction: Secondary | ICD-10-CM | POA: Diagnosis not present

## 2020-11-28 DIAGNOSIS — M47816 Spondylosis without myelopathy or radiculopathy, lumbar region: Secondary | ICD-10-CM | POA: Diagnosis not present

## 2020-11-28 DIAGNOSIS — Z20822 Contact with and (suspected) exposure to covid-19: Secondary | ICD-10-CM | POA: Diagnosis present

## 2020-11-28 DIAGNOSIS — I130 Hypertensive heart and chronic kidney disease with heart failure and stage 1 through stage 4 chronic kidney disease, or unspecified chronic kidney disease: Secondary | ICD-10-CM | POA: Diagnosis present

## 2020-11-28 LAB — BASIC METABOLIC PANEL
Anion gap: 12 (ref 5–15)
BUN: 17 mg/dL (ref 8–23)
CO2: 21 mmol/L — ABNORMAL LOW (ref 22–32)
Calcium: 8.6 mg/dL — ABNORMAL LOW (ref 8.9–10.3)
Chloride: 93 mmol/L — ABNORMAL LOW (ref 98–111)
Creatinine, Ser: 1.07 mg/dL (ref 0.61–1.24)
GFR, Estimated: >60 mL/min (ref >60)
Glucose, Bld: 314 mg/dL — ABNORMAL HIGH (ref 70–99)
Potassium: 3.7 mmol/L (ref 3.5–5.1)
Sodium: 126 mmol/L — ABNORMAL LOW (ref 135–145)

## 2020-11-28 LAB — GLUCOSE, CAPILLARY
Glucose-Capillary: 260 mg/dL — ABNORMAL HIGH (ref 70–99)
Glucose-Capillary: 282 mg/dL — ABNORMAL HIGH (ref 70–99)
Glucose-Capillary: 314 mg/dL — ABNORMAL HIGH (ref 70–99)
Glucose-Capillary: 238 mg/dL — ABNORMAL HIGH (ref 70–99)
Glucose-Capillary: 225 mg/dL — ABNORMAL HIGH (ref 70–99)

## 2020-11-28 LAB — CBC
HCT: 44.2 % (ref 39.0–52.0)
Hemoglobin: 15.6 g/dL (ref 13.0–17.0)
MCH: 31.1 pg (ref 26.0–34.0)
MCHC: 35.3 g/dL (ref 30.0–36.0)
MCV: 88 fL (ref 80.0–100.0)
Platelets: 299 10*3/uL (ref 150–400)
RBC: 5.02 MIL/uL (ref 4.22–5.81)
RDW: 12.9 % (ref 11.5–15.5)
WBC: 12.3 10*3/uL — ABNORMAL HIGH (ref 4.0–10.5)
nRBC: 0 % (ref 0.0–0.2)

## 2020-11-28 LAB — ECHOCARDIOGRAM COMPLETE
Area-P 1/2: 1.87 cm2
Height: 71 in
P 1/2 time: 1048 msec
S' Lateral: 3.34 cm
Weight: 3121.71 oz

## 2020-11-28 LAB — MRSA NEXT GEN BY PCR, NASAL: MRSA by PCR Next Gen: NOT DETECTED

## 2020-11-28 MED ORDER — MAGNESIUM SULFATE 2 GM/50ML IV SOLN
2.0000 g | Freq: Once | INTRAVENOUS | Status: AC
  Administered 2020-11-28: 2 g via INTRAVENOUS
  Filled 2020-11-28: qty 50

## 2020-11-28 MED ORDER — LABETALOL HCL 5 MG/ML IV SOLN
10.0000 mg | INTRAVENOUS | Status: DC | PRN
  Administered 2020-11-28 – 2020-11-29 (×2): 10 mg via INTRAVENOUS
  Filled 2020-11-28 (×3): qty 4

## 2020-11-28 MED ORDER — BISACODYL 10 MG RE SUPP
10.0000 mg | Freq: Every day | RECTAL | Status: DC
  Administered 2020-11-29 – 2020-12-19 (×16): 10 mg via RECTAL
  Filled 2020-11-28 (×19): qty 1

## 2020-11-28 MED ORDER — LORAZEPAM 2 MG/ML IJ SOLN
0.5000 mg | Freq: Four times a day (QID) | INTRAMUSCULAR | Status: DC | PRN
  Administered 2020-11-28 – 2020-11-29 (×3): 0.5 mg via INTRAVENOUS
  Filled 2020-11-28 (×3): qty 1

## 2020-11-28 MED ORDER — BISACODYL 10 MG RE SUPP
10.0000 mg | Freq: Once | RECTAL | Status: DC
  Filled 2020-11-28: qty 1

## 2020-11-28 MED ORDER — POTASSIUM PHOSPHATES 15 MMOLE/5ML IV SOLN
20.0000 mmol | Freq: Once | INTRAVENOUS | Status: AC
  Administered 2020-11-29: 20 mmol via INTRAVENOUS
  Filled 2020-11-28 (×2): qty 6.67

## 2020-11-28 MED ORDER — HALOPERIDOL LACTATE 5 MG/ML IJ SOLN
2.0000 mg | Freq: Once | INTRAMUSCULAR | Status: AC
  Administered 2020-11-28: 2 mg via INTRAMUSCULAR
  Filled 2020-11-28: qty 1

## 2020-11-28 MED ORDER — CHLORHEXIDINE GLUCONATE CLOTH 2 % EX PADS
6.0000 | MEDICATED_PAD | Freq: Every day | CUTANEOUS | Status: DC
  Administered 2020-11-28 – 2020-12-23 (×26): 6 via TOPICAL

## 2020-11-28 NOTE — Consult Note (Signed)
[facility] SURGICAL CONSULTATION NOTE (initial) - [id number]   HISTORY OF PRESENT ILLNESS (HPI):  75 y.o. male presented to [facility] ED yesterday evening for evaluation of acute encephalopathy, with symptomatic hyponatremia.  Difficult to obtain history directly from the gentleman, but apparently had a reported history of diarrhea.  Patient reports no known abdominal surgery.  Surgery is consulted by hospitalist physician Dr. Denton Brick in this context for evaluation and management of apparent small bowel obstruction, based on initial plain abdominal series, and degree of abdominal improvement following significant bilious output from nasogastric tube placed earlier today.   PAST MEDICAL HISTORY (PMH):  Past Medical History:  Diagnosis Date   Arthritis    BPH (benign prostatic hyperplasia)    CKD (chronic kidney disease), stage II    Complicated UTI (urinary tract infection) Dec.2015   Depression    hx. of   Diabetes mellitus without complication (HCC)    diet controlled   Foley catheter in place 03/20/2014   GERD (gastroesophageal reflux disease)    occasionally   Heart murmur    hx. of at 17 yrs. old   HOH (hard of hearing)    Hypertension    Lower GI bleed 2017   a. ? due to polyp.   Pneumonia    hx. of   Rheumatic fever    when 4 yrs. old   Sleep apnea    does not use c-pap machine   Sodium (Na) deficiency    managed in nursing center12/2015-Feb-2016     PAST SURGICAL HISTORY [facility]):  Past Surgical History:  Procedure Laterality Date   CARPAL TUNNEL RELEASE Left 2010   CARPAL TUNNEL RELEASE Right 07/13/2017   Procedure: CARPAL TUNNEL RELEASE;  Surgeon: Carole Civil, MD;  Location: [facility] ORS;  Service: Orthopedics;  Laterality: Right;   CERVICAL SPINE SURGERY  2010   COLONOSCOPY WITH PROPOFOL N/A 11/30/2015   Procedure: COLONOSCOPY WITH PROPOFOL;  Surgeon: Daneil Dolin, MD;  Location: [facility] ENDO SUITE;  Service: Endoscopy;  Laterality: N/A;   KNEE  ARTHROSCOPY  [place] surgery - broken nose     POLYPECTOMY  11/30/2015   Procedure: POLYPECTOMY;  Surgeon: Daneil Dolin, MD;  Location: [facility] ENDO SUITE;  Service: Endoscopy;;  polyp at ascending colon, rectal polyp   TRANSURETHRAL INCISION OF PROSTATE N/A 01/20/2015   Procedure: TRANSURETHRAL INCISION OF THE PROSTATE (TUIP);  Surgeon: Irine Seal, MD;  Location: [facility] ORS;  Service: Urology;  Laterality: N/A;     MEDICATIONS:  Prior to Admission medications   Medication Sig Start Date End Date Taking? Authorizing Provider  amLODipine (NORVASC) 10 MG tablet Take 1 tablet (10 mg total) by mouth daily. 08/25/17   Kathie Dike, MD  aspirin 325 MG tablet Take 1 tablet (325 mg total) by mouth daily. 08/25/17   Kathie Dike, MD  atorvastatin (LIPITOR) 40 MG tablet Take 40 mg by mouth daily.    [provider]  metFORMIN (GLUCOPHAGE) 500 MG tablet Take 500 mg by mouth daily with breakfast.    [provider]  naproxen (NAPROSYN) 500 MG tablet Take 1 tablet (500 mg total) by mouth 2 (two) times daily. 06/24/20   Evalee Jefferson, PA-C  telmisartan (MICARDIS) 40 MG tablet Take 40 mg by mouth daily.  10/16/18   [provider]     ALLERGIES:  No Known Allergies   SOCIAL HISTORY:  Social History   Socioeconomic History   Marital status: Divorced  Spouse name: Not on file   Number of children: Not on file   Years of education: Not on file   Highest education level: Not on file  Occupational History   Not on file  Tobacco Use   Smoking status: Former    Packs/day: 1.50    Years: 14.00    Pack years: 21.00    Types: Cigarettes    Quit date: 08/06/1975    Years since quitting: 45.3   Smokeless tobacco: Never  Vaping Use   Vaping Use: Never used  Substance and Sexual Activity   Alcohol use: Yes    Alcohol/week: 3.0 standard drinks    Types: 3 Cans of beer per week    Comment: 3 12 oz cans of beer each week.None since 03/2014   Drug  use: No   Sexual activity: Yes    Birth control/protection: None  Other Topics Concern   Not on file  Social History Narrative   Not on file   Social Determinants of Health   Financial Resource Strain: Not on file  Food Insecurity: Not on file  Transportation Needs: Not on file  Physical Activity: Not on file  Stress: Not on file  Social Connections: Not on file  Intimate Partner Violence: Not on file     FAMILY HISTORY:  Family History  Problem Relation Age of Onset   Heart disease Sister    Hypertension Sister       REVIEW OF SYSTEMS:  Review of Systems  Unable to perform ROS: Mental acuity   VITAL SIGNS:  Temp:  [97.8 F (36.6 C)-98.4 F (36.9 C)] 97.9 F (36.6 C) (08/27 1127) Pulse Rate:  [29-82] 67 (08/27 1230) Resp:  [13-29] 20 (08/27 1230) BP: (128-215)/(48-97) 161/71 (08/27 1200) SpO2:  [90 %-97 %] 93 % (08/27 1230) Weight:  [88.5 kg] 88.5 kg (08/27 0447)     Height: '5\' 11"'$  (180.3 cm) Weight: 88.5 kg BMI (Calculated): 27.22   INTAKE/OUTPUT:  08/26 0701 - 08/27 0700 In: 2718.4 [P.O.:240; I.V.:1478.4; IV Piggyback:1000] Out: 900 [Urine:900]  PHYSICAL EXAM:  Physical Exam Blood pressure (!) 161/71, pulse 67, temperature 97.9 F (36.6 C), temperature source Oral, resp. rate 20, height '5\' 11"'$  (1.803 m), weight 88.5 kg, SpO2 93 %. Last Weight  Most recent update: 11/28/2020  4:47 AM    Weight  88.5 kg (195 lb 1.7 oz)             CONSTITUTIONAL: Well developed, and nourished, appropriately responsive and aware without distress.  Both hard of hearing, and significant confusion. EYES: Sclera non-icteric.   EARS, NOSE, MOUTH AND THROAT:  The oropharynx is clear. Oral mucosa is pink and moist.  NG tube in place.  Hard of hearing.  NECK: Trachea is midline, and there is no jugular venous distension.  LYMPH NODES:  Lymph nodes in the neck are not enlarged. RESPIRATORY:  Lungs are clear, and breath sounds are equal bilaterally. Normal respiratory effort  without pathologic use of accessory muscles. CARDIOVASCULAR: Heart is regular in rate and rhythm. GI: The abdomen is markedly distended, though reportedly slightly improved from admission, otherwise soft, and nontender. There were no palpable masses, identified, though quite phonetic.. I could not appreciate hepatosplenomegaly. There were hypoactive sounds. MUSCULOSKELETAL:  Symmetrical muscle tone appreciated in all four extremities.    SKIN: Skin turgor is normal. No pathologic skin lesions appreciated.  NEUROLOGIC:  Motor and sensation appear grossly normal.  Cranial nerves are without appreciable defect. PSYCH: Confused, disoriented.  Data Reviewed  I have personally reviewed what is currently available of the patient's imaging, recent labs and medical records.    Labs:  CBC Latest Ref Rng & Units 11/28/2020 11/27/2020 08/28/2018  WBC 4.0 - 10.5 K/uL 12.3(H) 14.3(H) 8.7  Hemoglobin 13.0 - 17.0 g/dL 15.6 14.8 16.6  Hematocrit 39.0 - 52.0 % 44.2 42.5 48.3  Platelets 150 - 400 K/uL 299 232 309   CMP Latest Ref Rng & Units 11/28/2020 11/27/2020 11/27/2020  Glucose 70 - 99 mg/dL 314(H) 232(H) -  BUN 8 - 23 mg/dL 17 16 -  Creatinine 0.61 - 1.24 mg/dL 1.07 1.20 -  Sodium 135 - 145 mmol/L 126(L) 123(L) 121(L)  Potassium 3.5 - 5.1 mmol/L 3.7 3.7 -  Chloride 98 - 111 mmol/L 93(L) 93(L) -  CO2 22 - 32 mmol/L 21(L) 23 -  Calcium 8.9 - 10.3 mg/dL 8.6(L) 8.0(L) -  Total Protein 6.5 - 8.1 g/dL - - -  Total Bilirubin 0.3 - 1.2 mg/dL - - -  Alkaline Phos 38 - 126 U/L - - -  AST 15 - 41 U/L - - -  ALT 0 - 44 U/L - - -     Imaging studies:   Last 24 hrs: DG CHEST PORT 1 VIEW  Result Date: 11/28/2020 CLINICAL DATA:  75 year old male status post nasogastric tube placement EXAM: PORTABLE CHEST 1 VIEW COMPARISON:  Prior radiograph obtained earlier today FINDINGS: Interval placement of a nasogastric tube. The tube appears well positioned overlying the gastric fundus. Inspiratory volumes are low with  bibasilar atelectasis. No evidence of pneumothorax or large effusion. Incompletely imaged dilated small bowel in the upper abdomen suggests underlying bowel obstruction. IMPRESSION: Well-positioned nasogastric tube. Electronically Signed   By: Jacqulynn Cadet M.D.   On: 11/28/2020 10:15   DG ABD ACUTE 2+V W 1V CHEST  Result Date: 11/28/2020 CLINICAL DATA:  Nausea, vomiting, abdominal pain EXAM: DG ABDOMEN ACUTE WITH 1 VIEW CHEST COMPARISON:  08/28/2018 FINDINGS: Relatively low lung volumes with crowding of infrahilar bronchovascular structures Heart size upper limits normal for technique. No definite effusion. No pneumothorax. No free air. Multiple gas dilated small bowel loops in the abdomen. The colon is decompressed. Aortic Atherosclerosis (ICD10-170.0). Mild spondylitic changes in the lumbar spine. IMPRESSION: 1. Dilated small bowel loops suggesting distal small bowel obstruction. Electronically Signed   By: Lucrezia Europe M.D.   On: 11/28/2020 08:51     Assessment/Plan:  75 y.o. male with apparent virgin abdomen, abdominal distention consistent with ileus versus small bowel obstruction somewhat improved with nasogastric decompression, complicated by pertinent comorbidities including.  Patient Active Problem List   Diagnosis Date Noted   SBO (small bowel obstruction) ([place]) 11/28/2020   Acute CVA (cerebrovascular accident) ([place]) 08/24/2017   Altered mental status 08/23/2017   Sleep apnea 08/23/2017   Elevated troponin 08/23/2017   Abnormal CT of the head 08/23/2017   Hypertension 08/23/2017   Type 2 diabetes mellitus ([place]) 08/23/2017   Syncope 08/23/2017   S/P carpal tunnel release right 07/13/17 07/27/2017   AKI (acute kidney injury) ([place]) 11/29/2015   Lower GI bleed 11/28/2015   Rectal bleed 11/28/2015   Rectal bleeding 11/28/2015   Benign prostatic hypertrophy with urinary retention 01/20/2015   Urinary retention 05/21/2014   ARF (acute renal failure) ([place]) 03/26/2014   IDDM (insulin  dependent diabetes mellitus) ([place]) 03/26/2014   UTI (lower urinary tract infection) 03/24/2014   Hyponatremia 03/24/2014   Acute encephalopathy 03/24/2014    -Continue NG tube decompression.  -We will need CT imaging,  hopefully will be at less risk after further hemodynamic stabilization/electrolyte normalization.  -At present time doubt will tolerate oral contrast, hopefully IV contrast will present in excessive risk.  - DVT prophylaxis  -PPI prophylaxis All of the above findings and recommendations were discussed with the patient and family(if present), and all of medical teams questions were answered to their expressed satisfaction.  Thank you for the opportunity to participate in this patient's care.   -- Ronny Bacon, M.D., FACS 11/28/2020, 1:11 PM

## 2020-11-28 NOTE — Progress Notes (Signed)
Patient had a short run of SVT, Courage, MD made aware.  Labs and medications ordered.  Will continue to monitor.

## 2020-11-28 NOTE — Progress Notes (Signed)
Patient c/o nausea and vomiting.  Zofran given.  However, what patient describes as vomiting appears to be him coughing up mucous.  Additionally, patient states he's been having diarrhea.  Pt had BM, which was small, hard balls (Bristol Stool Scale 1). Abdomen distended, but bowel sounds present. Patient is a poor historian.  I did request a KUB to see if patient is constipated, which could be relieved.  Dr. Josephine Cables responded "Patient is stable and not in distress. I will defer it to the AM Doc'. Thanks "

## 2020-11-28 NOTE — Progress Notes (Signed)
  Echocardiogram 2D Echocardiogram has been performed.  Albert Garrison 11/28/2020, 2:58 PM

## 2020-11-28 NOTE — Progress Notes (Signed)
Patient is impulsive and has pulled out 3 PIVs during my shift.  He repeatedly gets up without assistance.  He has been from chair to bed to chair several times tonight.  Complete bed change has been done x2 along with CHG bath.

## 2020-11-28 NOTE — Progress Notes (Signed)
Patient's abdomen distended. Abd xray ordered. Order to place NG tube. NG tube placed. Awaiting cxray to confirm placement.

## 2020-11-28 NOTE — Progress Notes (Addendum)
Patient Demographics:    Albert Garrison, is a 75 y.o. male, DOB - [birth year], MW:[id number]  Admit date - 11/27/2020   Admitting Physician Olanda Downie Denton Brick, MD  Outpatient Primary MD for the patient is Celene Squibb, MD  LOS - 0   No chief complaint on file.       Subjective:    Manon Hilding today has no fevers,   No chest pain, -Developed abdominal pain and emesis overnight, had rather hard stools -Abdomen became distended overnight--episodes of confusion or disorientation overnight  Assessment  & Plan :    Principal Problem:   SBO (small bowel obstruction) (HCC) Active Problems:   Hyponatremia   Acute encephalopathy   Type 2 diabetes mellitus (HCC)  Brief Summary:- 75 y.o. male  of  BPH, diabetes mellitus, GERD presents to the ED with generalized weakness, nausea without vomiting and diarrhea of 24-hour duration was admitted on 11/27/2020 with symptomatic hyponatremia with a sodium of 118, post admission developed clinical and radiological signs consistent with SBO, NG was placed on 11/28/2020  A/p  1)Symptomatic Hyponatremia--- suspect due to diarrhea/GI losses sodium is 118>>120>>121>>123>>126 -Continue IV fluids -Monitor BMP and adjust IV fluid rate and consistency appropriately   2)SBO--- clinical and radiological findings of SBO -N.p.o. -- NG tube placed on 11/28/2020 to low intermittent suction -Discussed with general surgeon Dr. Kristeen Miss at bedside, plans for CT abdomen and pelvis on 11/29/2020  3)DM2 -A1C is 8.6--- reflecting uncontrolled DM with hyperglycemia PTA Hold Metformin as he has SBO  Use Novolog/Humalog Sliding scale insulin with Accu-Cheks/Fingersticks as ordered    4)Social/Ethics--plan of care discussed with patient and patient's aunt Ms. Abundio Miu . --patient is a full code   5)History of recurrent AKI's----no definite CKD - renally adjust medications, avoid  nephrotoxic agents / dehydration  / hypotension   6)HTN-stable, hold amlodipine, Avapro while n.p.o.  may use IV labetalol when necessary  Every 4 hours for systolic blood pressure over 160 mmhg  7) BPH with LUTs and urinary retention--- post void bladder scan with over 750 mL of urine -Indwelling Foley catheter placed on 11/28/2020 -We will start p.o. Flomax when oral intake resumes  8)Non-sustained Vtach in the setting of HypoNatremia and Hypophosphotamia---replace electrolytes and get echo   Disposition/Need for in-Hospital Stay- patient unable to be discharged at this time due to -symptomatic hyponatremia requiring IV fluids and close monitoring, SBO requiring NG tube may require surgical intervention   Dispo: The patient is from: Home              Anticipated d/c is to: Home              Anticipated d/c date is: >3 days              Patient currently is not medically stable to d/c. Barriers: Not Clinically Stable-   Code Status : -  Code Status: Full Code   Family Communication:   (patient is alert, awake and coherent)  Discussed with His Aunt Ms Abundio Miu-- at [phone] Consults  :  Gen Surgery  DVT Prophylaxis  :   - SCDs  enoxaparin (LOVENOX) injection 40 mg Start: 11/27/20 1945 SCDs Start: 11/27/20 1854 Place TED hose Start: 11/27/20 1854  Procedures:- -Indwelling Foley  catheter placed on 11/28/2020 NG tube placed on 11/28/2020 to low intermittent suction  Lab Results  Component Value Date   PLT 299 11/28/2020    Inpatient Medications  Scheduled Meds:  bisacodyl  10 mg Rectal Once   bisacodyl  10 mg Rectal QHS   Chlorhexidine Gluconate Cloth  6 each Topical Daily   enoxaparin (LOVENOX) injection  40 mg Subcutaneous Q24H   insulin aspart  0-5 Units Subcutaneous QHS   insulin aspart  0-6 Units Subcutaneous TID WC   sodium chloride flush  3 mL Intravenous Q12H   sodium chloride flush  3 mL Intravenous Q12H   Continuous Infusions:  sodium chloride 100 mL/hr at  11/28/20 1120   sodium chloride     PRN Meds:.sodium chloride, acetaminophen **OR** acetaminophen, bisacodyl, labetalol, ondansetron **OR** ondansetron (ZOFRAN) IV, sodium chloride flush    Anti-infectives (From admission, onward)    None         Objective:   Vitals:   11/28/20 1100 11/28/20 1127 11/28/20 1200 11/28/20 1230  BP: (!) 169/78  (!) 161/71   Pulse: 68 69 68 67  Resp: (!) 22 19 (!) 24 20  Temp:  97.9 F (36.6 C)    TempSrc:  Oral    SpO2: 93% 92% 91% 93%  Weight:      Height:        Wt Readings from Last 3 Encounters:  11/28/20 88.5 kg  06/24/20 86.2 kg  11/08/18 77.1 kg    Intake/Output Summary (Last 24 hours) at 11/28/2020 1308 Last data filed at 11/28/2020 1120 Gross per 24 hour  Intake 2665.94 ml  Output 1200 ml  Net 1465.94 ml   Physical Exam  Gen:- Awake Alert,  in no apparent distress Nose- NG tube Neck-Supple Neck,No JVD,.  Lungs-  CTAB , fair symmetrical air movement CV- S1, S2 normal, regular  Abd-diminished bowel sounds, abdomen is distended and firm Extremity/Skin:- No  edema, pedal pulses present  Psych-affect is appropriate, oriented x3, episodes of forgetfulness and occasional disorientation and impulsiveness Neuro-no new focal deficits, no tremors GU-Foley in situ   Data Review:   Micro Results Recent Results (from the past 240 hour(s))  Resp Panel by RT-PCR (Flu A&B, Covid) Nasopharyngeal Swab     Status: None   Collection Time: 11/27/20  9:16 AM   Specimen: Nasopharyngeal Swab; Nasopharyngeal(NP) swabs in vial transport medium  Result Value Ref Range Status   SARS Coronavirus 2 by RT PCR NEGATIVE NEGATIVE Final    Comment: (NOTE) SARS-CoV-2 target nucleic acids are NOT DETECTED.  The SARS-CoV-2 RNA is generally detectable in upper respiratory specimens during the acute phase of infection. The lowest concentration of SARS-CoV-2 viral copies this assay can detect is 138 copies/mL. A negative result does not preclude  SARS-Cov-2 infection and should not be used as the sole basis for treatment or other patient management decisions. A negative result may occur with  improper specimen collection/handling, submission of specimen other than nasopharyngeal swab, presence of viral mutation(s) within the areas targeted by this assay, and inadequate number of viral copies(<138 copies/mL). A negative result must be combined with clinical observations, patient history, and epidemiological information. The expected result is Negative.  Fact Sheet for Patients:  EntrepreneurPulse.com.au  Fact Sheet for Healthcare Providers:  IncredibleEmployment.be  This test is no t yet approved or cleared by the [place] FDA and  has been authorized for detection and/or diagnosis of SARS-CoV-2 by FDA under an Emergency Use Authorization (EUA). This EUA will remain  in effect (meaning this test can be used) for the duration of the COVID-19 declaration under Section 564(b)(1) of the Act, 21 U.S.C.section 360bbb-3(b)(1), unless the authorization is terminated  or revoked sooner.       Influenza A by PCR NEGATIVE NEGATIVE Final   Influenza B by PCR NEGATIVE NEGATIVE Final    Comment: (NOTE) The Xpert Xpress SARS-CoV-2/FLU/RSV plus assay is intended as an aid in the diagnosis of influenza from Nasopharyngeal swab specimens and should not be used as a sole basis for treatment. Nasal washings and aspirates are unacceptable for Xpert Xpress SARS-CoV-2/FLU/RSV testing.  Fact Sheet for Patients: EntrepreneurPulse.com.au  Fact Sheet for Healthcare Providers: IncredibleEmployment.be  This test is not yet approved or cleared by the [place] FDA and has been authorized for detection and/or diagnosis of SARS-CoV-2 by FDA under an Emergency Use Authorization (EUA). This EUA will remain in effect (meaning this test can be used) for the duration of  the COVID-19 declaration under Section 564(b)(1) of the Act, 21 U.S.C. section 360bbb-3(b)(1), unless the authorization is terminated or revoked.  Performed at [facility], [address] [place], [place] [address]   MRSA Next Gen by PCR, Nasal     Status: None   Collection Time: 11/27/20 12:15 PM   Specimen: Nasal Mucosa; Nasal Swab  Result Value Ref Range Status   MRSA by PCR Next Gen NOT DETECTED NOT DETECTED Final    Comment: (NOTE) The GeneXpert MRSA Assay (FDA approved for NASAL specimens only), is one component of a comprehensive MRSA colonization surveillance program. It is not intended to diagnose MRSA infection nor to guide or monitor treatment for MRSA infections. Test performance is not FDA approved in patients less than 42 years old. Performed at [facility], [address], [place], [place] [address]     Radiology Reports DG CHEST PORT 1 VIEW  Result Date: 11/28/2020 CLINICAL DATA:  75 year old male status post nasogastric tube placement EXAM: PORTABLE CHEST 1 VIEW COMPARISON:  Prior radiograph obtained earlier today FINDINGS: Interval placement of a nasogastric tube. The tube appears well positioned overlying the gastric fundus. Inspiratory volumes are low with bibasilar atelectasis. No evidence of pneumothorax or large effusion. Incompletely imaged dilated small bowel in the upper abdomen suggests underlying bowel obstruction. IMPRESSION: Well-positioned nasogastric tube. Electronically Signed   By: Jacqulynn Cadet M.D.   On: 11/28/2020 10:15   DG ABD ACUTE 2+V W 1V CHEST  Result Date: 11/28/2020 CLINICAL DATA:  Nausea, vomiting, abdominal pain EXAM: DG ABDOMEN ACUTE WITH 1 VIEW CHEST COMPARISON:  08/28/2018 FINDINGS: Relatively low lung volumes with crowding of infrahilar bronchovascular structures Heart size upper limits normal for technique. No definite effusion. No pneumothorax. No free air. Multiple gas dilated small bowel loops in the abdomen. The colon is  decompressed. Aortic Atherosclerosis (ICD10-170.0). Mild spondylitic changes in the lumbar spine. IMPRESSION: 1. Dilated small bowel loops suggesting distal small bowel obstruction. Electronically Signed   By: Lucrezia Europe M.D.   On: 11/28/2020 08:51     CBC Recent Labs  Lab 11/27/20 0943 11/28/20 0429  WBC 14.3* 12.3*  HGB 14.8 15.6  HCT 42.5 44.2  PLT 232 299  MCV 89.3 88.0  MCH 31.1 31.1  MCHC 34.8 35.3  RDW 12.9 12.9  LYMPHSABS 1.3  --   MONOABS 1.1*  --   EOSABS 0.0  --   BASOSABS 0.0  --    Chemistries  Recent Labs  Lab 11/27/20 0943 11/27/20 1250 11/27/20 1852 11/27/20 1918 11/28/20 0429  NA 118* 120* 121* 123* 126*  K 4.0  --   --  3.7 3.7  CL 88*  --   --  93* 93*  CO2 21*  --   --  23 21*  GLUCOSE 266*  --   --  232* 314*  BUN 20  --   --  16 17  CREATININE 1.27*  --   --  1.20 1.07  CALCIUM 8.7*  --   --  8.0* 8.6*  MG  --   --   --  1.8  --   AST 19  --   --   --   --   ALT 16  --   --   --   --   ALKPHOS 51  --   --   --   --   BILITOT 1.8*  --   --   --   --    ------------------------------------------------------------------------------------------------------------------ No results for input(s): CHOL, HDL, LDLCALC, TRIG, CHOLHDL, LDLDIRECT in the last 72 hours.  Lab Results  Component Value Date   HGBA1C 8.6 (H) 11/27/2020   ------------------------------------------------------------------------------------------------------------------ No results for input(s): TSH, T4TOTAL, T3FREE, THYROIDAB in the last 72 hours.  Invalid input(s): FREET3 ------------------------------------------------------------------------------------------------------------------ No results for input(s): VITAMINB12, FOLATE, FERRITIN, TIBC, IRON, RETICCTPCT in the last 72 hours.  Coagulation profile No results for input(s): INR, PROTIME in the last 168 hours.  No results for input(s): DDIMER in the last 72 hours.  Cardiac Enzymes No results for input(s): CKMB,  TROPONINI, MYOGLOBIN in the last 168 hours.  Invalid input(s): CK ------------------------------------------------------------------------------------------------------------------ No results found for: BNP   Roxan Hockey M.D on 11/28/2020 at 1:08 PM  Go to [email or web address] - for contact info  [facility] - Office  [phone]

## 2020-11-29 ENCOUNTER — Inpatient Hospital Stay (HOSPITAL_COMMUNITY): Payer: Medicare Other

## 2020-11-29 DIAGNOSIS — K56609 Unspecified intestinal obstruction, unspecified as to partial versus complete obstruction: Secondary | ICD-10-CM | POA: Diagnosis not present

## 2020-11-29 LAB — CBC
HCT: 43.1 % (ref 39.0–52.0)
Hemoglobin: 14.5 g/dL (ref 13.0–17.0)
MCH: 30.6 pg (ref 26.0–34.0)
MCHC: 33.6 g/dL (ref 30.0–36.0)
MCV: 90.9 fL (ref 80.0–100.0)
Platelets: 303 10*3/uL (ref 150–400)
RBC: 4.74 MIL/uL (ref 4.22–5.81)
RDW: 13.2 % (ref 11.5–15.5)
WBC: 11.9 10*3/uL — ABNORMAL HIGH (ref 4.0–10.5)
nRBC: 0 % (ref 0.0–0.2)

## 2020-11-29 LAB — BASIC METABOLIC PANEL
Anion gap: 10 (ref 5–15)
Anion gap: 12 (ref 5–15)
BUN: 20 mg/dL (ref 8–23)
BUN: 20 mg/dL (ref 8–23)
CO2: 21 mmol/L — ABNORMAL LOW (ref 22–32)
CO2: 24 mmol/L (ref 22–32)
Calcium: 7.7 mg/dL — ABNORMAL LOW (ref 8.9–10.3)
Calcium: 8.3 mg/dL — ABNORMAL LOW (ref 8.9–10.3)
Chloride: 96 mmol/L — ABNORMAL LOW (ref 98–111)
Chloride: 99 mmol/L (ref 98–111)
Creatinine, Ser: 1.19 mg/dL (ref 0.61–1.24)
Creatinine, Ser: 1.22 mg/dL (ref 0.61–1.24)
GFR, Estimated: >60 mL/min (ref >60)
GFR, Estimated: >60 mL/min (ref >60)
Glucose, Bld: 174 mg/dL — ABNORMAL HIGH (ref 70–99)
Glucose, Bld: 186 mg/dL — ABNORMAL HIGH (ref 70–99)
Potassium: 3.3 mmol/L — ABNORMAL LOW (ref 3.5–5.1)
Potassium: 3.9 mmol/L (ref 3.5–5.1)
Sodium: 130 mmol/L — ABNORMAL LOW (ref 135–145)
Sodium: 132 mmol/L — ABNORMAL LOW (ref 135–145)

## 2020-11-29 LAB — RENAL FUNCTION PANEL
Albumin: 3.2 g/dL — ABNORMAL LOW (ref 3.5–5.0)
Anion gap: 10 (ref 5–15)
BUN: 20 mg/dL (ref 8–23)
CO2: 25 mmol/L (ref 22–32)
Calcium: 8.4 mg/dL — ABNORMAL LOW (ref 8.9–10.3)
Chloride: 96 mmol/L — ABNORMAL LOW (ref 98–111)
Creatinine, Ser: 1.21 mg/dL (ref 0.61–1.24)
GFR, Estimated: >60 mL/min (ref >60)
Glucose, Bld: 172 mg/dL — ABNORMAL HIGH (ref 70–99)
Phosphorus: 2.3 mg/dL — ABNORMAL LOW (ref 2.5–4.6)
Potassium: 3.3 mmol/L — ABNORMAL LOW (ref 3.5–5.1)
Sodium: 131 mmol/L — ABNORMAL LOW (ref 135–145)

## 2020-11-29 LAB — MAGNESIUM
Magnesium: 2.4 mg/dL (ref 1.7–2.4)
Magnesium: 2.5 mg/dL — ABNORMAL HIGH (ref 1.7–2.4)

## 2020-11-29 LAB — GLUCOSE, CAPILLARY
Glucose-Capillary: 172 mg/dL — ABNORMAL HIGH (ref 70–99)
Glucose-Capillary: 201 mg/dL — ABNORMAL HIGH (ref 70–99)
Glucose-Capillary: 206 mg/dL — ABNORMAL HIGH (ref 70–99)
Glucose-Capillary: 268 mg/dL — ABNORMAL HIGH (ref 70–99)

## 2020-11-29 MED ORDER — SODIUM CHLORIDE 0.9 % IV SOLN: 250.0000 mL | INTRAVENOUS | Status: DC

## 2020-11-29 MED ORDER — NOREPINEPHRINE 4 MG/250ML-% IV SOLN
INTRAVENOUS | Status: AC
  Administered 2020-11-30: 10 ug/min via INTRAVENOUS
  Filled 2020-11-29: qty 250

## 2020-11-29 MED ORDER — POTASSIUM PHOSPHATES 15 MMOLE/5ML IV SOLN
20.0000 mmol | Freq: Once | INTRAVENOUS | Status: DC
  Filled 2020-11-29: qty 6.67

## 2020-11-29 MED ORDER — INSULIN ASPART 100 UNIT/ML IJ SOLN
0.0000 [IU] | Freq: Three times a day (TID) | INTRAMUSCULAR | Status: DC
  Administered 2020-11-29: 2 [IU] via SUBCUTANEOUS
  Administered 2020-11-30 (×2): 3 [IU] via SUBCUTANEOUS

## 2020-11-29 MED ORDER — FENTANYL 2500MCG IN NS 250ML (10MCG/ML) PREMIX INFUSION
0.0000 ug/h | INTRAVENOUS | Status: DC
Start: 2020-11-29 — End: 2020-12-01
  Administered 2020-11-30: 100 ug/h via INTRAVENOUS
  Administered 2020-11-30: 150 ug/h via INTRAVENOUS
  Filled 2020-11-29: qty 250

## 2020-11-29 MED ORDER — BISACODYL 10 MG RE SUPP: 10.0000 mg | Freq: Once | RECTAL | Status: DC

## 2020-11-29 MED ORDER — NOREPINEPHRINE 4 MG/250ML-% IV SOLN
2.0000 ug/min | INTRAVENOUS | Status: DC
  Administered 2020-11-30: 8 ug/min via INTRAVENOUS
  Filled 2020-11-29 (×2): qty 250
  Filled 2020-11-29: qty 500

## 2020-11-29 MED ORDER — POTASSIUM CHLORIDE 10 MEQ/100ML IV SOLN
10.0000 meq | INTRAVENOUS | Status: AC
  Administered 2020-11-29 (×2): 10 meq via INTRAVENOUS
  Filled 2020-11-29 (×2): qty 100

## 2020-11-29 MED ORDER — NOREPINEPHRINE 4 MG/250ML-% IV SOLN: 0.0000 ug/min | INTRAVENOUS | Status: DC

## 2020-11-29 MED ORDER — IOHEXOL 350 MG/ML SOLN
100.0000 mL | Freq: Once | INTRAVENOUS | Status: AC | PRN
  Administered 2020-11-29: 85 mL via INTRAVENOUS

## 2020-11-29 MED ORDER — VASOPRESSIN 20 UNITS/100 ML INFUSION FOR SHOCK
0.0000 [IU]/min | INTRAVENOUS | Status: DC
  Administered 2020-11-30: 0.03 [IU]/min via INTRAVENOUS
  Filled 2020-11-29 (×3): qty 100

## 2020-11-29 MED ORDER — POTASSIUM CHLORIDE 10 MEQ/100ML IV SOLN
10.0000 meq | INTRAVENOUS | Status: AC
  Administered 2020-11-29 (×4): 10 meq via INTRAVENOUS
  Filled 2020-11-29 (×4): qty 100

## 2020-11-29 MED ORDER — GLYCOPYRROLATE 0.2 MG/ML IJ SOLN
0.1000 mg | Freq: Once | INTRAMUSCULAR | Status: AC
  Administered 2020-11-29: 0.1 mg via INTRAVENOUS
  Filled 2020-11-29: qty 1

## 2020-11-29 MED ORDER — DEXMEDETOMIDINE HCL IN NACL 400 MCG/100ML IV SOLN
0.4000 ug/kg/h | INTRAVENOUS | Status: DC
  Administered 2020-11-29: 0.4 ug/kg/h via INTRAVENOUS
  Administered 2020-11-29: 0.6 ug/kg/h via INTRAVENOUS
  Filled 2020-11-29 (×2): qty 100

## 2020-11-29 MED ORDER — HALOPERIDOL LACTATE 5 MG/ML IJ SOLN
2.0000 mg | Freq: Once | INTRAMUSCULAR | Status: AC
  Administered 2020-11-29: 2 mg via INTRAMUSCULAR
  Filled 2020-11-29: qty 1

## 2020-11-29 NOTE — Progress Notes (Addendum)
[facility] Progress Note Patient Name: Albert Garrison DOB: 21-Oct-1945 MRN: [id number]   Date of Service  11/29/2020  HPI/Events of Note  Camera eval: Hypoxic, obtunded, encephalopathic. Sats low, brady, 46. Stat atropine received. Bagging started.  Code blue called.  SBO. Has NG tube.     eICU Interventions  Bed side MD in room now. Brief ACLS- CPR and epi with rosc. Getting ready for intubation.       Intervention Category Major Interventions: Respiratory failure - evaluation and management;Change in mental status - evaluation and management  Elmer Sow 11/29/2020, 10:37 PM  23:27 Camera alert: Hypotensive. Discussed with bed side RN. Ok to go up on levophed gt. PVC's. Do not see wide complex tachy now, earlier EKG prior to code/intubation showed LBBB, ST ? Elevation.  AHRF. Brady arrest.  SBO HTN AKI/BPH.  Plan:  - stat EKG, trend troponin. Advised CCM consultation. CxR and labs are pending.  - follow CT head and Ct abdomen ( SBO, NG in place), still not reported. - NG to suction. - get LA/ABG/Mag.  - follow UOP - Vasopressin started 0.03  23:56 CCM notified by me. CT abd: SBO, transition poin mid bowel. CTH no acute changes. BMP: co2 21, K/mag normal.  Earlier Gen surgery notes reviewed.  - follow ABG, CxR - start TTM- to 36 degree for in hospital arrest. - follow troponin series. - keep MAP >65.  1:04 Camera follow through VS stable now, HR 76, MAP 75. In synchrony with vent. ABG , labs reviewed. Pro cal 1.4. LA 5. Cr 1.19, Ph 7.35 Hco3 at 20. Trop 116.  - Trend trop and LA.

## 2020-11-29 NOTE — Progress Notes (Signed)
Patient Demographics:    Albert Garrison, is a 75 y.o. male, DOB - [birth year], [id number]  Admit date - 11/27/2020   Admitting Physician Jaymon Dudek Denton Brick, MD  Outpatient Primary MD for the patient is Celene Squibb, MD  LOS - 1   No chief complaint on file.       Subjective:    Albert Garrison today has no fevers,   No chest pain, -Restless, despite Precedex IV -Pulling out IV tubes and NG tube -Patient's brother Albert Garrison is at bedside  Assessment  & Plan :    Principal Problem:   SBO (small bowel obstruction) ([place]) Active Problems:   Hyponatremia   Acute encephalopathy   Type 2 diabetes mellitus ([place])  Brief Summary:- 75 y.o. male  of  BPH, diabetes mellitus, GERD presents to the ED with generalized weakness, nausea without vomiting and diarrhea of 24-hour duration was admitted on 11/27/2020 with symptomatic hyponatremia with a sodium of 118, post admission developed clinical and radiological signs consistent with SBO, NG was placed on 11/28/2020  A/p  1)Symptomatic Hyponatremia--- suspect due to diarrhea/GI losses sodium is 118>>120>>121>>123>>126>>130 -Continue IV fluids -Monitor BMP and adjust IV fluid rate and consistency appropriately   2)SBO--- clinical and radiological findings of SBO -N.p.o. -- NG tube placed on 11/28/2020 to low intermittent suction -Discussed with general surgeon Dr. Kristeen Miss at bedside,  Awaiting report of CT abdomen and pelvis done on 11/29/2020  3)DM2 -A1C is 8.6--- reflecting uncontrolled DM with hyperglycemia PTA Hold Metformin as he has SBO  Use Novolog/Humalog Sliding scale insulin with Accu-Cheks/Fingersticks as ordered    4)Social/Ethics--plan of care discussed with patient and patient's Maternal aunt Albert Garrison and his brother Albert Garrison) Albert Garrison and as well his son Albert Garrison --patient is a full code   5)History of recurrent AKI's----no definite  CKD - renally adjust medications, avoid nephrotoxic agents / dehydration  / hypotension   6)HTN-stable, hold amlodipine, Avapro while n.p.o.  may use IV labetalol when necessary  Every 4 hours for systolic blood pressure over 160 mmhg  7) BPH with LUTs and urinary retention--- post void bladder scan with over 750 mL of urine -Indwelling Foley catheter placed on 11/28/2020 -We will start p.o. Flomax when oral intake resumes  8)HFpEF/chronic diastolic dysfunction CHF --non-sustained Vtach in the setting of HypoNatremia and Hypophosphotamia---replace electrolytes -Echo with EF of 55 to 60% with moderate LVH, no regional wall motion normalities patient does not have grade 1 diastolic dysfunction -No evidence of CHF exacerbation at this time  9)Acute metabolic encephalopathy--- multifactorial including small bowel obstruction, electrolyte derangement specifically hyponatremia -Consider CT head without contrast given persistent mental status changes despite improvement in sodium --Okay to use Precedex low-dose as needed for agitation as patient keeps pulling out NG tube and lines  10)OSA--patient has obstructive sleep apnea with significant apneic episodes when he sleeps -Unable to use BiPAP at night due to NG tube and SBO   Disposition/Need for in-Hospital Stay- patient unable to be discharged at this time due to -symptomatic hyponatremia requiring IV fluids and close monitoring, SBO requiring NG tube may require surgical intervention   Dispo: The patient is from: Home              Anticipated d/c is  to: Home              Anticipated d/c date is: >3 days              Patient currently is not medically stable to d/c. Barriers: Not Clinically Stable-   Code Status : -  Code Status: Full Code   Family Communication:   (patient is alert, awake and coherent)  Discussed with His Aunt Ms Abundio Garrison-- at [phone] -Discussed with patient's Maternal aunt Albert Garrison and his brother Albert Garrison) Albert  Braelon Garrison and as well his son Albert Garrison Consults  :  Gen Surgery  DVT Prophylaxis  :   - SCDs  enoxaparin (LOVENOX) injection 40 mg Start: 11/27/20 1945 SCDs Start: 11/27/20 1854 Place TED hose Start: 11/27/20 1854  Procedures:- -Indwelling Foley catheter placed on 11/28/2020 NG tube placed on 11/28/2020 to low intermittent suction  Lab Results  Component Value Date   PLT 303 11/29/2020    Inpatient Medications  Scheduled Meds:  bisacodyl  10 mg Rectal Once   bisacodyl  10 mg Rectal QHS   Chlorhexidine Gluconate Cloth  6 each Topical Daily   enoxaparin (LOVENOX) injection  40 mg Subcutaneous Q24H   insulin aspart  0-5 Units Subcutaneous QHS   insulin aspart  0-6 Units Subcutaneous TID WC   sodium chloride flush  3 mL Intravenous Q12H   sodium chloride flush  3 mL Intravenous Q12H   Continuous Infusions:  sodium chloride 100 mL/hr at 11/29/20 1713   sodium chloride     dexmedetomidine (PRECEDEX) IV infusion 0.4 mcg/kg/hr (11/29/20 1520)   PRN Meds:.sodium chloride, acetaminophen **OR** acetaminophen, bisacodyl, labetalol, LORazepam, ondansetron **OR** ondansetron (ZOFRAN) IV, sodium chloride flush    Anti-infectives (From admission, onward)    None         Objective:   Vitals:   11/29/20 1505 11/29/20 1506 11/29/20 1620 11/29/20 1700  BP: (!) 130/96   (!) 150/70  Pulse: 63  (!) 29 95  Resp:   (!) 26   Temp:   (!) 96.7 F (35.9 C)   TempSrc:   Axillary   SpO2:  95%  95%  Weight:      Height:        Wt Readings from Last 3 Encounters:  11/29/20 83.9 kg  06/24/20 86.2 kg  11/08/18 77.1 kg    Intake/Output Summary (Last 24 hours) at 11/29/2020 1748 Last data filed at 11/29/2020 1520 Gross per 24 hour  Intake 2953.62 ml  Output 1250 ml  Net 1703.62 ml   Physical Exam  Gen:-Restless, confused  nose- NG tube Neck-Supple Neck,No JVD,.  Lungs-  CTAB , fair symmetrical air movement CV- S1, S2 normal, regular  Abd-diminished bowel sounds, abdomen  is distended Extremity/Skin:- No  edema, pedal pulses present  NeuroPsych-restless, confused and disoriented alternating with episodes of lethargy GU-Foley in situ   Data Review:   Micro Results Recent Results (from the past 240 hour(s))  Resp Panel by RT-PCR (Flu A&B, Covid) Nasopharyngeal Swab     Status: None   Collection Time: 11/27/20  9:16 AM   Specimen: Nasopharyngeal Swab; Nasopharyngeal(NP) swabs in vial transport medium  Result Value Ref Range Status   SARS Coronavirus 2 by RT PCR NEGATIVE NEGATIVE Final    Comment: (NOTE) SARS-CoV-2 target nucleic acids are NOT DETECTED.  The SARS-CoV-2 RNA is generally detectable in upper respiratory specimens during the acute phase of infection. The lowest concentration of SARS-CoV-2 viral copies this assay can detect  is 138 copies/mL. A negative result does not preclude SARS-Cov-2 infection and should not be used as the sole basis for treatment or other patient management decisions. A negative result may occur with  improper specimen collection/handling, submission of specimen other than nasopharyngeal swab, presence of viral mutation(s) within the areas targeted by this assay, and inadequate number of viral copies(<138 copies/mL). A negative result must be combined with clinical observations, patient history, and epidemiological information. The expected result is Negative.  Fact Sheet for Patients:  EntrepreneurPulse.com.au  Fact Sheet for Healthcare Providers:  IncredibleEmployment.be  This test is no t yet approved or cleared by the [place] FDA and  has been authorized for detection and/or diagnosis of SARS-CoV-2 by FDA under an Emergency Use Authorization (EUA). This EUA will remain  in effect (meaning this test can be used) for the duration of the COVID-19 declaration under Section 564(b)(1) of the Act, 21 U.S.C.section 360bbb-3(b)(1), unless the authorization is terminated  or  revoked sooner.       Influenza A by PCR NEGATIVE NEGATIVE Final   Influenza B by PCR NEGATIVE NEGATIVE Final    Comment: (NOTE) The Xpert Xpress SARS-CoV-2/FLU/RSV plus assay is intended as an aid in the diagnosis of influenza from Nasopharyngeal swab specimens and should not be used as a sole basis for treatment. Nasal washings and aspirates are unacceptable for Xpert Xpress SARS-CoV-2/FLU/RSV testing.  Fact Sheet for Patients: EntrepreneurPulse.com.au  Fact Sheet for Healthcare Providers: IncredibleEmployment.be  This test is not yet approved or cleared by the [place] FDA and has been authorized for detection and/or diagnosis of SARS-CoV-2 by FDA under an Emergency Use Authorization (EUA). This EUA will remain in effect (meaning this test can be used) for the duration of the COVID-19 declaration under Section 564(b)(1) of the Act, 21 U.S.C. section 360bbb-3(b)(1), unless the authorization is terminated or revoked.  Performed at [facility], [address], [place], [place] [address]   MRSA Next Gen by PCR, Nasal     Status: None   Collection Time: 11/27/20 12:15 PM   Specimen: Nasal Mucosa; Nasal Swab  Result Value Ref Range Status   MRSA by PCR Next Gen NOT DETECTED NOT DETECTED Final    Comment: (NOTE) The GeneXpert MRSA Assay (FDA approved for NASAL specimens only), is one component of a comprehensive MRSA colonization surveillance program. It is not intended to diagnose MRSA infection nor to guide or monitor treatment for MRSA infections. Test performance is not FDA approved in patients less than 46 years old. Performed at [facility], [address], [place],  [address]     Radiology Reports DG CHEST PORT 1 VIEW  Result Date: 11/29/2020 CLINICAL DATA:  Altered mental status. NG tube placement verification. EXAM: PORTABLE CHEST 1 VIEW COMPARISON:  Chest x-ray dated 11/28/2020. FINDINGS: Enteric tube appears  adequately positioned in the stomach. Heart size and mediastinal contours are stable. Lungs are clear. No pleural effusion is seen. IMPRESSION: Enteric tube appears adequately positioned in the stomach. No active disease. Electronically Signed   By: Franki Cabot M.D.   On: 11/29/2020 09:51   DG CHEST PORT 1 VIEW  Result Date: 11/28/2020 CLINICAL DATA:  75 year old male status post nasogastric tube placement EXAM: PORTABLE CHEST 1 VIEW COMPARISON:  Prior radiograph obtained earlier today FINDINGS: Interval placement of a nasogastric tube. The tube appears well positioned overlying the gastric fundus. Inspiratory volumes are low with bibasilar atelectasis. No evidence of pneumothorax or large effusion. Incompletely imaged dilated small bowel in the  upper abdomen suggests underlying bowel obstruction. IMPRESSION: Well-positioned nasogastric tube. Electronically Signed   By: Jacqulynn Cadet M.D.   On: 11/28/2020 10:15   DG ABD ACUTE 2+V W 1V CHEST  Result Date: 11/28/2020 CLINICAL DATA:  Nausea, vomiting, abdominal pain EXAM: DG ABDOMEN ACUTE WITH 1 VIEW CHEST COMPARISON:  08/28/2018 FINDINGS: Relatively low lung volumes with crowding of infrahilar bronchovascular structures Heart size upper limits normal for technique. No definite effusion. No pneumothorax. No free air. Multiple gas dilated small bowel loops in the abdomen. The colon is decompressed. Aortic Atherosclerosis (ICD10-170.0). Mild spondylitic changes in the lumbar spine. IMPRESSION: 1. Dilated small bowel loops suggesting distal small bowel obstruction. Electronically Signed   By: Lucrezia Europe M.D.   On: 11/28/2020 08:51   ECHOCARDIOGRAM COMPLETE  Result Date: 11/28/2020    ECHOCARDIOGRAM REPORT   Patient Name:   MYLAN DANESE Date of Exam: 11/28/2020 Medical Rec #:  [id number]       Height:       71.0 in Accession #:    [id number]      Weight:       195.1 lb Date of Birth:  Nov 14, 1945       BSA:          2.086 m Patient Age:    84 years         BP:           167/82 mmHg Patient Gender: M               HR:           65 bpm. Exam Location:  [facility] Procedure: 2D Echo, Cardiac Doppler and Color Doppler Indications:    R94.31 Abnormal EKG  History:        Patient has prior history of Echocardiogram examinations, most                 recent 08/23/2017. Risk Factors:Diabetes and Hypertension.  Sonographer:    Bernadene Person RDCS Referring Phys: [place]  1. Left ventricular ejection fraction, by estimation, is 55 to 60%. The left ventricle has normal function. The left ventricle has no regional wall motion abnormalities. There is moderate concentric left ventricular hypertrophy. Left ventricular diastolic parameters are consistent with Grade I diastolic dysfunction (impaired relaxation).  2. Right ventricular systolic function is normal. The right ventricular size is normal. Tricuspid regurgitation signal is inadequate for assessing PA pressure.  3. The mitral valve is normal in structure. Trivial mitral valve regurgitation. No evidence of mitral stenosis.  4. The aortic valve is tricuspid. Aortic valve regurgitation is mild.  5. Aortic dilatation noted. There is mild dilatation of the aortic root, measuring 43 mm. Comparison(s): A prior study was performed on 08/23/2017. Slight increase in aortic root. FINDINGS  Left Ventricle: Left ventricular ejection fraction, by estimation, is 55 to 60%. The left ventricle has normal function. The left ventricle has no regional wall motion abnormalities. The left ventricular internal cavity size was normal in size. There is  moderate concentric left ventricular hypertrophy. Left ventricular diastolic parameters are consistent with Grade I diastolic dysfunction (impaired relaxation). Right Ventricle: The right ventricular size is normal. No increase in right ventricular wall thickness. Right ventricular systolic function is normal. Tricuspid regurgitation signal is inadequate for assessing PA  pressure. Left Atrium: Left atrial size was normal in size. Right Atrium: Right atrial size was normal in size. Pericardium: There is no evidence of pericardial effusion. Mitral Valve: The mitral valve  is normal in structure. Trivial mitral valve regurgitation. No evidence of mitral valve stenosis. Tricuspid Valve: The tricuspid valve is normal in structure. Tricuspid valve regurgitation is not demonstrated. Aortic Valve: The aortic valve is tricuspid. Aortic valve regurgitation is mild. Aortic regurgitation PHT measures 1048 msec. Pulmonic Valve: The pulmonic valve was not well visualized. Pulmonic valve regurgitation is mild. No evidence of pulmonic stenosis. Aorta: Aortic dilatation noted. There is mild dilatation of the aortic root, measuring 43 mm. IAS/Shunts: The atrial septum is grossly normal.  LEFT VENTRICLE PLAX 2D LVIDd:         4.97 cm  Diastology LVIDs:         3.34 cm  LV e' medial:    5.40 cm/s LV PW:         1.38 cm  LV E/e' medial:  5.9 LV IVS:        1.39 cm  LV e' lateral:   6.23 cm/s LVOT diam:     2.20 cm  LV E/e' lateral: 5.1 LV SV:         100 LV SV Index:   48 LVOT Area:     3.80 cm  RIGHT VENTRICLE RV S prime:     14.30 cm/s TAPSE (M-mode): 1.9 cm LEFT ATRIUM             Index       RIGHT ATRIUM          Index LA diam:        2.50 cm 1.20 cm/m  RA Area:     8.74 cm LA Vol (A2C):   33.2 ml 15.91 ml/m RA Volume:   11.90 ml 5.70 ml/m LA Vol (A4C):   32.7 ml 15.67 ml/m LA Biplane Vol: 33.5 ml 16.06 ml/m  AORTIC VALVE LVOT Vmax:   108.00 cm/s LVOT Vmean:  68.500 cm/s LVOT VTI:    0.262 m AI PHT:      1048 msec  AORTA Ao Root diam: 4.30 cm Ao Asc diam:  3.80 cm MITRAL VALVE MV Area (PHT): 1.87 cm    SHUNTS MV Decel Time: 405 msec    Systemic VTI:  0.26 m MV E velocity: 31.70 cm/s  Systemic Diam: 2.20 cm MV A velocity: 72.00 cm/s MV E/A ratio:  0.44 Rudean Haskell MD Electronically signed by Rudean Haskell MD Signature Date/Time: 11/28/2020/3:42:26 PM    Final      CBC Recent  Labs  Lab 11/27/20 [id number] 11/28/20 0429 11/29/20 0023  WBC 14.3* 12.3* 11.9*  HGB 14.8 15.6 14.5  HCT 42.5 44.2 43.1  PLT 232 299 303  MCV 89.3 88.0 90.9  MCH 31.1 31.1 30.6  MCHC 34.8 35.3 33.6  RDW 12.9 12.9 13.2  LYMPHSABS 1.3  --   --   MONOABS 1.1*  --   --   EOSABS 0.0  --   --   BASOSABS 0.0  --   --    Chemistries  Recent Labs  Lab 11/27/20 0943 11/27/20 1250 11/27/20 1852 11/27/20 1918 11/28/20 0429 11/29/20 0023  NA 118* 120* 121* 123* 126* 131*  130*  K 4.0  --   --  3.7 3.7 3.3*  3.3*  CL 88*  --   --  93* 93* 96*  96*  CO2 21*  --   --  23 21* 25  24  GLUCOSE 266*  --   --  232* 314* 172*  174*  BUN 20  --   --  16 17 20  20  CREATININE 1.27*  --   --  1.20 1.07 1.21  1.22  CALCIUM 8.7*  --   --  8.0* 8.6* 8.4*  8.3*  MG  --   --   --  1.8  --  2.4  AST 19  --   --   --   --   --   ALT 16  --   --   --   --   --   ALKPHOS 51  --   --   --   --   --   BILITOT 1.8*  --   --   --   --   --    ------------------------------------------------------------------------------------------------------------------ No results for input(s): CHOL, HDL, LDLCALC, TRIG, CHOLHDL, LDLDIRECT in the last 72 hours.  Lab Results  Component Value Date   HGBA1C 8.6 (H) 11/27/2020   ------------------------------------------------------------------------------------------------------------------ No results for input(s): TSH, T4TOTAL, T3FREE, THYROIDAB in the last 72 hours.  Invalid input(s): FREET3 ------------------------------------------------------------------------------------------------------------------ No results for input(s): VITAMINB12, FOLATE, FERRITIN, TIBC, IRON, RETICCTPCT in the last 72 hours.  Coagulation profile No results for input(s): INR, PROTIME in the last 168 hours.  No results for input(s): DDIMER in the last 72 hours.  Cardiac Enzymes No results for input(s): CKMB, TROPONINI, MYOGLOBIN in the last 168 hours.  Invalid input(s):  CK ------------------------------------------------------------------------------------------------------------------ No results found for: BNP  Roxan Hockey M.D on 11/29/2020 at 5:48 PM  Go to [email or web address] - for contact info  [facility] - Office  [phone]

## 2020-11-29 NOTE — ED Provider Notes (Signed)
I was called to a CODE BLUE 2/28 at approximately 10:30 at night.  Patient was being bagged by ambu by respiratory therapist.  Code was attended by Dr. Josephine Cables.  I was asked to intubate the patient.  He was still having some respiratory effort and was biting when trying to suction him.  Given 10 of etomidate.  That relaxed him enough to attempt intubation.  He is NG needed to be removed as it was coiled up in his mouth.  He was successfully intubated on second attempt with glide scope.  Equal breath sounds.  Copious secretions in trachea.  Chest x-ray to be ordered by Dr. Josephine Cables.  Procedure Name: Intubation Date/Time: 11/29/2020 10:58 PM Performed by: Hayden Rasmussen, MD Pre-anesthesia Checklist: Patient identified, Patient being monitored, Emergency Drugs available, Timeout performed and Suction available Oxygen Delivery Method: Ambu bag Preoxygenation: Pre-oxygenation with 100% oxygen Induction Type: Rapid sequence Ventilation: Mask ventilation without difficulty Laryngoscope Size: Glidescope and 3 Number of attempts: 2 Placement Confirmation: ETT inserted through vocal cords under direct vision, CO2 detector and Breath sounds checked- equal and bilateral Secured at: 24 cm Tube secured with: ETT holder Dental Injury: Teeth and Oropharynx as per pre-operative assessment  Difficulty Due To: Difficulty was anticipated       Hayden Rasmussen, MD 11/30/20 [phone]

## 2020-11-29 NOTE — Progress Notes (Signed)
Potassium phosphate 51mol in 5% dextrose still not available. Administrative coordinator aware and to reach out to [facility] to check on status of medication transportation [facility]

## 2020-11-29 NOTE — Progress Notes (Signed)
[facility] SURGICAL PROGRESS NOTE (cpt [phone])  Hospital Day(s): 1.   Post op day(s):  Marland Kitchen   Interval History: Patient seen and examined, no acute events or new complaints overnight.  Given patient's confused state, pulled his NG tube out despite restraints, successfully replaced with an additional 800 mL of output that is very dark.  Patient is quite somnolent at the present moment, well restrained and currently has a well-functioning NG tube at this time.  Review of Systems:  Unable to obtain due to mental status  Vital signs in last 24 hours: [min-max] current  Temp:  [97.9 F (36.6 C)-99.9 F (37.7 C)] 99.1 F (37.3 C) (08/28 1110) Pulse Rate:  [68-80] 72 (08/28 1330) Resp:  [16-29] 22 (08/28 1330) BP: (99-199)/(43-99) 125/65 (08/28 1330) SpO2:  [91 %-100 %] 100 % (08/28 1330) Weight:  [83.9 kg] 83.9 kg (08/28 0326)     Height: '5\' 11"'$  (180.3 cm) Weight: 83.9 kg BMI (Calculated): 25.81   Intake/Output last 2 shifts:  08/27 0701 - 08/28 0700 In: 2549.1 [P.O.:120; I.V.:2261.3; IV Piggyback:167.8] Out: 2400 [Urine:1600; Emesis/NG output:800]   Physical Exam:  Constitutional: Sedated, and in no distress  HENT: normocephalic without obvious abnormality  Neuro: Unable to assess Respiratory: breathing non-labored at rest  Cardiovascular: regular rate and sinus rhythm  Gastrointestinal: Markedly diminished distention, otherwise soft, non-tender, and without evidence of guarding, or rebound. Musculoskeletal: no edema or wounds, or deformities, currently in restraints.   Labs:  CBC Latest Ref Rng & Units 11/29/2020 11/28/2020 11/27/2020  WBC 4.0 - 10.5 K/uL 11.9(H) 12.3(H) 14.3(H)  Hemoglobin 13.0 - 17.0 g/dL 14.5 15.6 14.8  Hematocrit 39.0 - 52.0 % 43.1 44.2 42.5  Platelets 150 - 400 K/uL 303 299 232   CMP Latest Ref Rng & Units 11/29/2020 11/29/2020 11/28/2020  Glucose 70 - 99 mg/dL 172(H) 174(H) 314(H)  BUN 8 - 23 mg/dL '20 20 17  '$ Creatinine 0.61 - 1.24 mg/dL 1.21  1.22 1.07  Sodium 135 - 145 mmol/L 131(L) 130(L) 126(L)  Potassium 3.5 - 5.1 mmol/L 3.3(L) 3.3(L) 3.7  Chloride 98 - 111 mmol/L 96(L) 96(L) 93(L)  CO2 22 - 32 mmol/L 25 24 21(L)  Calcium 8.9 - 10.3 mg/dL 8.4(L) 8.3(L) 8.6(L)  Total Protein 6.5 - 8.1 g/dL - - -  Total Bilirubin 0.3 - 1.2 mg/dL - - -  Alkaline Phos 38 - 126 U/L - - -  AST 15 - 41 U/L - - -  ALT 0 - 44 U/L - - -   Radiology review:   Imaging studies:  CXR for NGT placement   Assessment/Plan: Apparent ileus, possible SBO versus partial SBO. 75 y.o. male complicated by pertinent comorbidities including: Patient Active Problem List   Diagnosis Date Noted   SBO (small bowel obstruction) ([place]) 11/28/2020   Acute CVA (cerebrovascular accident) ([place]) 08/24/2017   Altered mental status 08/23/2017   Sleep apnea 08/23/2017   Elevated troponin 08/23/2017   Abnormal CT of the head 08/23/2017   Hypertension 08/23/2017   Type 2 diabetes mellitus ([place]) 08/23/2017   Syncope 08/23/2017   S/P carpal tunnel release right 07/13/17 07/27/2017   AKI (acute kidney injury) ([place]) 11/29/2015   Lower GI bleed 11/28/2015   Rectal bleed 11/28/2015   Rectal bleeding 11/28/2015   Benign prostatic hypertrophy with urinary retention 01/20/2015   Urinary retention 05/21/2014   ARF (acute renal failure) ([place]) 03/26/2014   IDDM (insulin dependent diabetes mellitus) ([place]) 03/26/2014   UTI (lower urinary tract infection) 03/24/2014  Hyponatremia 03/24/2014   Acute encephalopathy 03/24/2014     -Still advised CT imaging, what portion of small bowel appears on above chest x-ray appears mildly dilated.  -If mental status does not allow obtaining reasonable CT imaging can proceed with serial abdominal films, upright or lateral decubitus with KUB.  -Currently there does not appear to be any impending abdominal catastrophe that warrants more aggressive intervention at this time.  -Surgery will continue to follow.   -- Ronny Bacon M.D.,  [facility] 11/29/2020 2:21 PM

## 2020-11-29 NOTE — Progress Notes (Signed)
Overnight this patient has pulled 2 NG tubes out even with being in bilateral wrist restraints, in mitts, and given ativan, as well as, haldol. Patient is refusing to allow me to replace NG tube. MD Josephine Cables is aware. No further interventions in place per MD. Patient is only alert to person at this time. Patient continues to try to get out of bed and pull at lines. Patient is still a threat to himself and continues to interfere with medical care. Speech is scattered and incoherent. This is a change from the beginning of my shift. MD also aware of this. No further orders. Will continue to try and replace NG tube per patient cooperation and tolerance.

## 2020-11-30 ENCOUNTER — Inpatient Hospital Stay (HOSPITAL_COMMUNITY): Payer: Medicare Other

## 2020-11-30 ENCOUNTER — Telehealth: Payer: Self-pay | Admitting: Student in an Organized Health Care Education/Training Program

## 2020-11-30 ENCOUNTER — Other Ambulatory Visit (HOSPITAL_COMMUNITY): Payer: Self-pay | Admitting: *Deleted

## 2020-11-30 ENCOUNTER — Encounter (HOSPITAL_COMMUNITY): Payer: Self-pay | Admitting: Family Medicine

## 2020-11-30 DIAGNOSIS — I471 Supraventricular tachycardia: Secondary | ICD-10-CM | POA: Diagnosis not present

## 2020-11-30 DIAGNOSIS — K56609 Unspecified intestinal obstruction, unspecified as to partial versus complete obstruction: Secondary | ICD-10-CM

## 2020-11-30 DIAGNOSIS — I469 Cardiac arrest, cause unspecified: Secondary | ICD-10-CM

## 2020-11-30 DIAGNOSIS — J9601 Acute respiratory failure with hypoxia: Secondary | ICD-10-CM | POA: Diagnosis not present

## 2020-11-30 DIAGNOSIS — E871 Hypo-osmolality and hyponatremia: Secondary | ICD-10-CM | POA: Diagnosis not present

## 2020-11-30 DIAGNOSIS — Z515 Encounter for palliative care: Secondary | ICD-10-CM

## 2020-11-30 DIAGNOSIS — R0600 Dyspnea, unspecified: Secondary | ICD-10-CM

## 2020-11-30 DIAGNOSIS — J9602 Acute respiratory failure with hypercapnia: Secondary | ICD-10-CM

## 2020-11-30 DIAGNOSIS — Z978 Presence of other specified devices: Secondary | ICD-10-CM | POA: Diagnosis present

## 2020-11-30 DIAGNOSIS — I501 Left ventricular failure: Secondary | ICD-10-CM | POA: Diagnosis not present

## 2020-11-30 DIAGNOSIS — R579 Shock, unspecified: Secondary | ICD-10-CM | POA: Diagnosis not present

## 2020-11-30 DIAGNOSIS — I498 Other specified cardiac arrhythmias: Secondary | ICD-10-CM | POA: Diagnosis present

## 2020-11-30 DIAGNOSIS — R0689 Other abnormalities of breathing: Secondary | ICD-10-CM

## 2020-11-30 LAB — CBC
HCT: 44.5 % (ref 39.0–52.0)
HCT: 41.8 % (ref 39.0–52.0)
Hemoglobin: 14.7 g/dL (ref 13.0–17.0)
Hemoglobin: 13.6 g/dL (ref 13.0–17.0)
MCH: 30.9 pg (ref 26.0–34.0)
MCH: 30.5 pg (ref 26.0–34.0)
MCHC: 33 g/dL (ref 30.0–36.0)
MCHC: 32.5 g/dL (ref 30.0–36.0)
MCV: 93.7 fL (ref 80.0–100.0)
MCV: 93.7 fL (ref 80.0–100.0)
Platelets: 415 10*3/uL — ABNORMAL HIGH (ref 150–400)
Platelets: 377 10*3/uL (ref 150–400)
RBC: 4.75 MIL/uL (ref 4.22–5.81)
RBC: 4.46 MIL/uL (ref 4.22–5.81)
RDW: 13.1 % (ref 11.5–15.5)
RDW: 13.3 % (ref 11.5–15.5)
WBC: 15.8 10*3/uL — ABNORMAL HIGH (ref 4.0–10.5)
WBC: 12.9 10*3/uL — ABNORMAL HIGH (ref 4.0–10.5)
nRBC: 0.1 % (ref 0.0–0.2)
nRBC: 0 % (ref 0.0–0.2)

## 2020-11-30 LAB — ECHOCARDIOGRAM LIMITED
Area-P 1/2: 2.33 cm2
Height: 71 in
S' Lateral: 2.7 cm
Weight: 3171.1 oz

## 2020-11-30 LAB — TROPONIN I (HIGH SENSITIVITY)
Troponin I (High Sensitivity): 116 ng/L (ref <18)
Troponin I (High Sensitivity): 263 ng/L (ref <18)
Troponin I (High Sensitivity): 306 ng/L (ref <18)
Troponin I (High Sensitivity): 285 ng/L (ref <18)

## 2020-11-30 LAB — COMPREHENSIVE METABOLIC PANEL
ALT: 21 U/L (ref 0–44)
AST: 32 U/L (ref 15–41)
Albumin: 2.7 g/dL — ABNORMAL LOW (ref 3.5–5.0)
Alkaline Phosphatase: 44 U/L (ref 38–126)
Anion gap: 9 (ref 5–15)
BUN: 22 mg/dL (ref 8–23)
CO2: 21 mmol/L — ABNORMAL LOW (ref 22–32)
Calcium: 7.3 mg/dL — ABNORMAL LOW (ref 8.9–10.3)
Chloride: 100 mmol/L (ref 98–111)
Creatinine, Ser: 1.3 mg/dL — ABNORMAL HIGH (ref 0.61–1.24)
GFR, Estimated: 57 mL/min — ABNORMAL LOW (ref >60)
Glucose, Bld: 242 mg/dL — ABNORMAL HIGH (ref 70–99)
Potassium: 4.5 mmol/L (ref 3.5–5.1)
Sodium: 130 mmol/L — ABNORMAL LOW (ref 135–145)
Total Bilirubin: 1.4 mg/dL — ABNORMAL HIGH (ref 0.3–1.2)
Total Protein: 5.6 g/dL — ABNORMAL LOW (ref 6.5–8.1)

## 2020-11-30 LAB — BLOOD GAS, ARTERIAL
Acid-base deficit: 5.1 mmol/L — ABNORMAL HIGH (ref 0.0–2.0)
Acid-base deficit: 8.3 mmol/L — ABNORMAL HIGH (ref 0.0–2.0)
Bicarbonate: 18.2 mmol/L — ABNORMAL LOW (ref 20.0–28.0)
Bicarbonate: 20.5 mmol/L (ref 20.0–28.0)
FIO2: 100
FIO2: 65
O2 Saturation: 98.5 %
O2 Saturation: 98.8 %
Patient temperature: 37
Patient temperature: 37.9
pCO2 arterial: 29.3 mmHg — ABNORMAL LOW (ref 32.0–48.0)
pCO2 arterial: 35.7 mmHg (ref 32.0–48.0)
pH, Arterial: 7.356 (ref 7.350–7.450)
pH, Arterial: 7.358 (ref 7.350–7.450)
pO2, Arterial: 115 mmHg — ABNORMAL HIGH (ref 83.0–108.0)
pO2, Arterial: 127 mmHg — ABNORMAL HIGH (ref 83.0–108.0)

## 2020-11-30 LAB — LACTIC ACID, PLASMA
Lactic Acid, Venous: 5 mmol/L (ref 0.5–1.9)
Lactic Acid, Venous: 3.6 mmol/L (ref 0.5–1.9)
Lactic Acid, Venous: 3.9 mmol/L (ref 0.5–1.9)
Lactic Acid, Venous: 1.3 mmol/L (ref 0.5–1.9)

## 2020-11-30 LAB — GLUCOSE, CAPILLARY
Glucose-Capillary: 146 mg/dL — ABNORMAL HIGH (ref 70–99)
Glucose-Capillary: 187 mg/dL — ABNORMAL HIGH (ref 70–99)
Glucose-Capillary: 210 mg/dL — ABNORMAL HIGH (ref 70–99)
Glucose-Capillary: 213 mg/dL — ABNORMAL HIGH (ref 70–99)
Glucose-Capillary: 234 mg/dL — ABNORMAL HIGH (ref 70–99)
Glucose-Capillary: 259 mg/dL — ABNORMAL HIGH (ref 70–99)
Glucose-Capillary: 259 mg/dL — ABNORMAL HIGH (ref 70–99)
Glucose-Capillary: 276 mg/dL — ABNORMAL HIGH (ref 70–99)

## 2020-11-30 LAB — HEPARIN LEVEL (UNFRACTIONATED)
Heparin Unfractionated: 0.15 IU/mL — ABNORMAL LOW (ref 0.30–0.70)
Heparin Unfractionated: <0.1 IU/mL — ABNORMAL LOW (ref 0.30–0.70)

## 2020-11-30 LAB — PROCALCITONIN: Procalcitonin: 1.47 ng/mL

## 2020-11-30 LAB — PHOSPHORUS: Phosphorus: 2.6 mg/dL (ref 2.5–4.6)

## 2020-11-30 LAB — BILIRUBIN, DIRECT: Bilirubin, Direct: 0.5 mg/dL — ABNORMAL HIGH (ref 0.0–0.2)

## 2020-11-30 MED ORDER — HEPARIN BOLUS VIA INFUSION
4000.0000 [IU] | Freq: Once | INTRAVENOUS | Status: AC
  Administered 2020-11-30: 4000 [IU] via INTRAVENOUS
  Filled 2020-11-30: qty 4000

## 2020-11-30 MED ORDER — VANCOMYCIN HCL 750 MG/150ML IV SOLN
750.0000 mg | Freq: Two times a day (BID) | INTRAVENOUS | Status: DC
  Administered 2020-11-30 (×2): 750 mg via INTRAVENOUS
  Filled 2020-11-30 (×4): qty 150

## 2020-11-30 MED ORDER — HEPARIN (PORCINE) 25000 UT/250ML-% IV SOLN
2250.0000 [IU]/h | INTRAVENOUS | Status: DC
  Administered 2020-11-30: 1000 [IU]/h via INTRAVENOUS
  Administered 2020-12-01: 1800 [IU]/h via INTRAVENOUS
  Administered 2020-12-01: 2050 [IU]/h via INTRAVENOUS
  Administered 2020-12-02: 2200 [IU]/h via INTRAVENOUS
  Administered 2020-12-03: 2250 [IU]/h via INTRAVENOUS
  Filled 2020-11-30 (×6): qty 250

## 2020-11-30 MED ORDER — INSULIN ASPART 100 UNIT/ML IJ SOLN
0.0000 [IU] | INTRAMUSCULAR | Status: DC
  Administered 2020-11-30: 1 [IU] via SUBCUTANEOUS
  Administered 2020-11-30 (×2): 3 [IU] via SUBCUTANEOUS
  Administered 2020-12-01 (×2): 1 [IU] via SUBCUTANEOUS
  Administered 2020-12-01 (×2): 2 [IU] via SUBCUTANEOUS
  Administered 2020-12-01: 3 [IU] via SUBCUTANEOUS
  Administered 2020-12-02: 2 [IU] via SUBCUTANEOUS
  Administered 2020-12-02 (×2): 1 [IU] via SUBCUTANEOUS

## 2020-11-30 MED ORDER — ORAL CARE MOUTH RINSE: 15.0000 mL | OROMUCOSAL | Status: DC

## 2020-11-30 MED ORDER — HEPARIN BOLUS VIA INFUSION
2500.0000 [IU] | Freq: Once | INTRAVENOUS | Status: AC
  Administered 2020-11-30: 2500 [IU] via INTRAVENOUS
  Filled 2020-11-30: qty 2500

## 2020-11-30 MED ORDER — HEPARIN BOLUS VIA INFUSION
2000.0000 [IU] | Freq: Once | INTRAVENOUS | Status: AC
Start: 2020-12-01 — End: 2020-11-30
  Administered 2020-11-30: 2000 [IU] via INTRAVENOUS
  Filled 2020-11-30: qty 2000

## 2020-11-30 MED ORDER — SODIUM CHLORIDE 0.9 % IV BOLUS
500.0000 mL | Freq: Once | INTRAVENOUS | Status: AC
  Administered 2020-11-30: 500 mL via INTRAVENOUS

## 2020-11-30 MED ORDER — ACETAMINOPHEN 650 MG RE SUPP
650.0000 mg | Freq: Four times a day (QID) | RECTAL | Status: DC | PRN
  Filled 2020-11-30: qty 1

## 2020-11-30 MED ORDER — VANCOMYCIN HCL 1500 MG/300ML IV SOLN
1500.0000 mg | Freq: Once | INTRAVENOUS | Status: AC
  Administered 2020-11-30: 1500 mg via INTRAVENOUS
  Filled 2020-11-30: qty 300

## 2020-11-30 MED ORDER — ORAL CARE MOUTH RINSE
15.0000 mL | OROMUCOSAL | Status: DC
  Administered 2020-11-30 – 2020-12-03 (×19): 15 mL via OROMUCOSAL

## 2020-11-30 MED ORDER — AMIODARONE HCL IN DEXTROSE 360-4.14 MG/200ML-% IV SOLN
30.0000 mg/h | INTRAVENOUS | Status: DC
  Administered 2020-11-30 – 2020-12-02 (×4): 30 mg/h via INTRAVENOUS
  Filled 2020-11-30 (×5): qty 200

## 2020-11-30 MED ORDER — ACETAMINOPHEN 325 MG PO TABS: 650.0000 mg | ORAL_TABLET | ORAL | Status: DC | PRN

## 2020-11-30 MED ORDER — AMIODARONE HCL IN DEXTROSE 360-4.14 MG/200ML-% IV SOLN
60.0000 mg/h | INTRAVENOUS | Status: AC
  Administered 2020-11-30: 60 mg/h via INTRAVENOUS
  Filled 2020-11-30: qty 200

## 2020-11-30 MED ORDER — CHLORHEXIDINE GLUCONATE 0.12% ORAL RINSE (MEDLINE KIT): 15.0000 mL | Freq: Two times a day (BID) | OROMUCOSAL | Status: DC

## 2020-11-30 MED ORDER — ASPIRIN 300 MG RE SUPP
300.0000 mg | Freq: Once | RECTAL | Status: AC
  Administered 2020-11-30: 300 mg via RECTAL
  Filled 2020-11-30: qty 1

## 2020-11-30 MED ORDER — MIDAZOLAM HCL 2 MG/2ML IJ SOLN
1.0000 mg | INTRAMUSCULAR | Status: DC | PRN
Start: 2020-11-30 — End: 2020-12-01
  Administered 2020-12-01 (×2): 2 mg via INTRAVENOUS
  Filled 2020-11-30 (×2): qty 2

## 2020-11-30 MED ORDER — PANTOPRAZOLE SODIUM 40 MG IV SOLR
40.0000 mg | INTRAVENOUS | Status: DC
  Administered 2020-11-30: 40 mg via INTRAVENOUS
  Filled 2020-11-30: qty 40

## 2020-11-30 MED ORDER — CHLORHEXIDINE GLUCONATE 0.12% ORAL RINSE (MEDLINE KIT)
15.0000 mL | Freq: Two times a day (BID) | OROMUCOSAL | Status: DC
  Administered 2020-11-30 – 2020-12-23 (×44): 15 mL via OROMUCOSAL
  Filled 2020-11-30: qty 15

## 2020-11-30 MED ORDER — SODIUM CHLORIDE 0.9 % IV SOLN
2.0000 g | Freq: Two times a day (BID) | INTRAVENOUS | Status: DC
  Administered 2020-11-30 (×3): 2 g via INTRAVENOUS
  Filled 2020-11-30 (×3): qty 2

## 2020-11-30 NOTE — Consult Note (Signed)
NAME:  Albert Garrison, MRN:  [id number], DOB:  May 13, 1945, LOS: 2 ADMISSION DATE:  11/27/2020, CONSULTATION DATE:  8/29 REFERRING MD:  Dr. Denton Brick, CHIEF COMPLAINT:  Diarrhea   History of Present Illness:  75 y/o M who presented to [facility] on 8/26 with reports of diarrhea.   Per chart, he reported diarrhea for several days with associated nausea, poor intake and decreased appetite.  Reportedly has had 4 COVID vaccines.  Initial work up notable for hyponatremia with initial Na+ of 118, AKI, glucosuria (>500 with 20 ketones), WBC 14.3. RVP was negative.   He was altered on admission thought secondary to symptomatic hyponatremia.  He developed nausea with vomiting on 8/26 requiring zofran.  He did have a BM that day (documented as [place] 1).  There were concerns for abdominal distention & XRAY was ordered 8/27 which showed multiple dilated small bowel loops concerning for distal SBO.  He was seen by surgery and recommended for conservative therapy.  NGT was placed and patient was made NPO. Subsequent CT of the Abd/Pelvis showed small bowel obstruction with likely transition point in the right mid abdomen, diverticulosis without acute diverticulitis, trace bilateral pleural effusions. ECHO completed 8/27 which showed grade I diastolic dysfunction, normal EF. Per notes, he became more agitated / pulling NGT multiple times 8/28 overnight.  He was started on precedex per TRH.  He was documented as having more frequent ectopy - VT during the day 8/28. Late PM 8/28, he was noted to have a rhythm change with PVC's, LBBB.  He became unresponsive with agonal respirations. CODE BLUE was called, ACLS initiated.  Patient was intubated.  ROSC was achieved after approximately 5 minutes resuscitation efforts. There was concerns for possible STEMI and he was started on Heparin infusion & amiodarone. Cardiology was consulted. Troponin peaked at 306.  Per notes, he was felt to need non-emergent cardiac evaluation once recovered  from acute illness.    Given complexity of issues, [facility] requested transfer to [facility] 8/29.    Pertinent  Medical History  CKD II Hyponatremia - prior admit in 2015  Depression  CVA HOH HTN NSVT  OSA - does not use CPAP  Former Smoker - quit 1977, 21 pack years  Former ETOH - quit 2015  DM II  GERD  Lower GIB - thought secondary to polyp  Rheumatic Fever - childhood Heart Murmur   Significant Hospital Events: Including procedures, antibiotic start and stop dates in addition to other pertinent events   8/26 Admit  8/26 N/V 8/27 KUB with concern for distal SBO. ECHO with LVEF 55-60%, no RWMA, grade I diastolic dysfunction [id number] CT ABD with small bowel obstruction with likely transition point in the right mid abdomen.  CT Head negative.  8/28 cardiac arrest >>NSVT with somewhat more sustained runs, progressive IVCD by ECG, reportedly bradycardia and unresponsiveness.  He was treated with atropine and epinephrine, had compressions under a minute with ROSC, no shocks delivered.  He was intubated and did require initiation of pressors including Levophed and vasopressin, also started on amiodarone 8/29 Transfer to [facility] for surgical / cardiac ongoing evaluation   Interim History / Subjective:  To ICU transfer on mechanical ventilation, Levophed and fentanyl drips infusing Urine output 1050 cc last 12 h  Objective   Blood pressure 119/63, pulse 64, temperature 98.6 F (37 C), temperature source Axillary, resp. rate 20, height '5\' 11"'$  (1.803 m), weight 89.9 kg, SpO2 100 %.    Vent Mode: PRVC FiO2 (%):  [55 %-100 %]  55 % Set Rate:  [18 bmp-22 bmp] 18 bmp Vt Set:  [600 mL] 600 mL PEEP:  [5 cmH20] 5 cmH20 Plateau Pressure:  [17 cmH20] 17 cmH20   Intake/Output Summary (Last 24 hours) at 11/30/2020 1133 Last data filed at 11/30/2020 [id number] Gross per 24 hour  Intake 3514.55 ml  Output 1751 ml  Net 1763.55 ml   Filed Weights   11/28/20 0447 11/29/20 0326 11/30/20 0500  Weight: 88.5 kg 83.9  kg 89.9 kg    Examination: Gen:      elderly man, no distress  HEENT:  EOMI, sclera anicteric, mild pallor Neck:     No JVD; no thyromegaly Lungs:    Basal crackles , rhonchi CV:         Regular rate and rhythm; no murmurs Abd:      absent bowel sounds; soft, non-tender; no palpable masses, distended Ext:    No edema; adequate peripheral perfusion Skin:      Warm and dry; no rash Neuro: sedate , RASS-2  Chest x-ray independently reviewed -ET tube in position, no infiltrates or effusions. Labs show improved sodium to 130 , slight increase in creatinine to 1.3, troponins flat, peaked at [facility] Problem list     Assessment & Plan:    NSVT  Bradycardic Arrest  Multiple electrolyte disturbances.vs precedex related  Circulatory shock  -Continue Levophed, titrate to MAP 65 and above -Back in NSR now, amio can be stopped in 24h if maintains -Seen by cardiology recomm non urgent ischemic testing  acute resp failure likely hypoxemic and hypercarbic Vent settings reviewed and adjusted, on spontaneous breathing trials in a.m.  Nausea / Vomiting with SBO  Concern for transition point on CT abd/pelvis  -Conservative management initially advised surgery , then felt to be too high risk for anesthesia, will ask surgery here to  consult. Continue NG to LIS, obtain flatplate of the abdomen  Symptomatic Hyponatremia  -Improved with normal saline  -Continue to monitor the  AKI  -Nonoliguric  -Monitor lytes daily for   DM II  Poorly controlled, admit A1c 8.6.   -SSI  -hold home metformin      Best Practice (right click and "Reselect all SmartList Selections" daily)   Diet/type: NPO DVT prophylaxis: prophylactic heparin  GI prophylaxis: PPI Lines: Central line and yes and it is still needed Foley:  Yes, and it is still needed Code Status:  full code Last date of multidisciplinary goals of care discussion [NA]  Labs   CBC: Recent Labs  Lab 11/27/20 0943  11/28/20 0429 11/29/20 0023 11/29/20 2243 11/30/20 0225  WBC 14.3* 12.3* 11.9* 15.8* 12.9*  NEUTROABS 11.7*  --   --   --   --   HGB 14.8 15.6 14.5 14.7 13.6  HCT 42.5 44.2 43.1 44.5 41.8  MCV 89.3 88.0 90.9 93.7 93.7  PLT 232 299 303 415* [id number]    Basic Metabolic Panel: Recent Labs  Lab 11/27/20 1918 11/28/20 0429 11/29/20 0023 11/29/20 2243 11/30/20 0224  NA 123* 126* 131*  130* 132* 130*  K 3.7 3.7 3.3*  3.3* 3.9 4.5  CL 93* 93* 96*  96* 99 100  CO2 23 21* 25  24 21* 21*  GLUCOSE 232* 314* 172*  174* 186* 242*  BUN '16 17 20  20 20 22  '$ CREATININE 1.20 1.07 1.21  1.22 1.19 1.30*  CALCIUM 8.0* 8.6* 8.4*  8.3* 7.7* 7.3*  MG 1.8  --  2.4 2.5*  --  PHOS 2.0*  --  2.3*  --  2.6   GFR: Estimated Creatinine Clearance: 51.5 mL/min (A) (by C-G formula based on SCr of 1.3 mg/dL (H)). Recent Labs  Lab 11/28/20 0429 11/29/20 0023 11/29/20 2243 11/30/20 0225 11/30/20 0503 11/30/20 0850  PROCALCITON  --   --  1.47  --   --   --   WBC 12.3* 11.9* 15.8* 12.9*  --   --   LATICACIDVEN  --   --  5.0* 3.6* 3.9* 1.3    Liver Function Tests: Recent Labs  Lab 11/27/20 0943 11/29/20 0023 11/30/20 0224  AST 19  --  32  ALT 16  --  21  ALKPHOS 51  --  44  BILITOT 1.8*  --  1.4*  PROT 7.0  --  5.6*  ALBUMIN 3.6 3.2* 2.7*   Recent Labs  Lab 11/27/20 0943  LIPASE 19   No results for input(s): AMMONIA in the last 168 hours.  ABG    Component Value Date/Time   PHART 7.358 11/30/2020 0530   PCO2ART 29.3 (L) 11/30/2020 0530   PO2ART 115 (H) 11/30/2020 0530   HCO3 18.2 (L) 11/30/2020 0530   ACIDBASEDEF 8.3 (H) 11/30/2020 0530   O2SAT 98.5 11/30/2020 0530     Coagulation Profile: No results for input(s): INR, PROTIME in the last 168 hours.  Cardiac Enzymes: No results for input(s): CKTOTAL, CKMB, CKMBINDEX, TROPONINI in the last 168 hours.  HbA1C: Hgb A1c MFr Bld  Date/Time Value Ref Range Status  11/27/2020 09:43 AM 8.6 (H) 4.8 - 5.6 % Final    Comment:     (NOTE) Pre diabetes:          5.7%-6.4%  Diabetes:              >6.4%  Glycemic control for   <7.0% adults with diabetes   08/22/2017 10:20 PM 7.1 (H) 4.8 - 5.6 % Final    Comment:    (NOTE) Pre diabetes:          5.7%-6.4% Diabetes:              >6.4% Glycemic control for   <7.0% adults with diabetes     CBG: Recent Labs  Lab 11/29/20 1112 11/29/20 1805 11/30/20 0214 11/30/20 0611 11/30/20 1118  GLUCAP 268* 201* 259* 259* 276*    Review of Systems:   Unable to complete as patient is on mechanical ventilation.  Information obtained from prior medical documentation.   Past Medical History:  He,  has a past medical history of Arthritis, BPH (benign prostatic hyperplasia), CKD (chronic kidney disease), stage II, Complicated UTI (urinary tract infection) (03/2014), Depression, GERD (gastroesophageal reflux disease), History of pneumonia, History of stroke, HOH (hard of hearing), Hypertension, Hyponatremia, Lower GI bleed (2017), NSVT (nonsustained ventricular tachycardia) ([place]), Rheumatic fever, Sleep apnea, and Type 2 diabetes mellitus ([place]).   Surgical History:   Past Surgical History:  Procedure Laterality Date   CARPAL TUNNEL RELEASE Left 2010   CARPAL TUNNEL RELEASE Right 07/13/2017   Procedure: CARPAL TUNNEL RELEASE;  Surgeon: Carole Civil, MD;  Location: [facility];  Service: Orthopedics;  Laterality: Right;   CERVICAL SPINE SURGERY  2010   COLONOSCOPY WITH PROPOFOL N/A 11/30/2015   Procedure: COLONOSCOPY WITH PROPOFOL;  Surgeon: Daneil Dolin, MD;  Location: [facility];  Service: Endoscopy;  Laterality: N/A;   KNEE ARTHROSCOPY  [place]   Nose surgery - broken nose     POLYPECTOMY  11/30/2015   Procedure: POLYPECTOMY;  Surgeon: Daneil Dolin, MD;  Location: [facility];  Service: Endoscopy;;  polyp at ascending colon, rectal polyp   TRANSURETHRAL INCISION OF PROSTATE N/A 01/20/2015   Procedure: TRANSURETHRAL INCISION OF THE  PROSTATE (TUIP);  Surgeon: Irine Seal, MD;  Location: [facility];  Service: Urology;  Laterality: N/A;     Social History:   reports that he quit smoking about 45 years ago. His smoking use included cigarettes. He has a 21.00 pack-year smoking history. He has never used smokeless tobacco. He reports current alcohol use of about 3.0 standard drinks per week. He reports that he does not use drugs.   Family History:  His family history includes Heart disease in his sister; Hypertension in his sister.   Allergies No Known Allergies   Home Medications  Prior to Admission medications   Medication Sig Start Date End Date Taking? Authorizing Provider  amLODipine (NORVASC) 10 MG tablet Take 1 tablet (10 mg total) by mouth daily. 08/25/17  Yes Kathie Dike, MD  aspirin 325 MG tablet Take 1 tablet (325 mg total) by mouth daily. 08/25/17  Yes Kathie Dike, MD  atorvastatin (LIPITOR) 40 MG tablet Take 40 mg by mouth daily.   Yes [provider]  metFORMIN (GLUCOPHAGE) 500 MG tablet Take 500 mg by mouth daily with breakfast.   Yes [provider]  naproxen (NAPROSYN) 500 MG tablet Take 1 tablet (500 mg total) by mouth 2 (two) times daily. Patient taking differently: Take 500 mg by mouth 2 (two) times daily as needed for mild pain or moderate pain. 06/24/20  Yes Idol, Almyra Free, PA-C  telmisartan (MICARDIS) 40 MG tablet Take 40 mg by mouth daily.  10/16/18  Yes [provider]     Critical care time: [place] MD. FCCP. [facility] Pager : [phone]  If no response to pager , please call [phone] until 7 pm After 7:00 pm call Elink  [phone]   11/30/2020

## 2020-11-30 NOTE — Telephone Encounter (Signed)
Paged by Dr. Josephine Cables to discuss case at [facility].   24M with CKD, BPH, DM2, and GERD who presented to the ED on 11/27/20 with diarrhea who also experienced decreased appetite, nausea, and poor p.o. intake.  Lab work remarkable for profound hyponatremia (Na 118).  This was thought to be secondary to significant diarrhea/GI loss.  He then developed some abdominal pain along with emesis and distended abdomen along with confusion disorientation on 08/27.  Sodium had been slowly uptrending from 118->126 with gentle hydration.  He had 1 episode of nonsustained VT that was thought to be in the setting of hyponatremia and hypophosphatemia.  NG tube had to be placed for SBO which resulted in significant bilious output.  He then became somnolent overnight on 08/27 and was also confused throughout the day where he was able to pull out his NG tube despite restraints.  His hyponatremia continued to correct to 130.  On 08/28 nursing documented frequent ectopy and runs of sustained VT at which point he was assessed.  He developed sustained VT initially with pulse however eventually lost pulse and required CPR for around 1 round of compressions with epi and no shock.  Following the brief arrest he was intubated after ROSC and subsequent ECG showed new left bundle branch block.  Per the documentation he was initially hypoxic, obtunded, and persistently encephalopathic with low sats and bradycardic to 46 at which point in time atropine was given before he developed VT arrest.  TTE  Result date: 11/28/20  1. Left ventricular ejection fraction, by estimation, is 55 to 60%. The  left ventricle has normal function. The left ventricle has no regional  wall motion abnormalities. There is moderate concentric left ventricular  hypertrophy. Left ventricular  diastolic parameters are consistent with Grade I diastolic dysfunction  (impaired relaxation).   2. Right ventricular systolic function is normal. The right ventricular  size is  normal. Tricuspid regurgitation signal is inadequate for assessing  PA pressure.   3. The mitral valve is normal in structure. Trivial mitral valve  regurgitation. No evidence of mitral stenosis.   4. The aortic valve is tricuspid. Aortic valve regurgitation is mild.   5. Aortic dilatation noted. There is mild dilatation of the aortic root,  measuring 43 mm.   Reviewed all available ECG and chart review of documentation along with discussion with Dr. Josephine Cables.  Patient does have new left bundle branch block following VT arrest and had preceding episodes of NSVT earlier in the night. hsT elevation (116->263). LBBB does not meet [place] criteria but is new from prior.  Data on management of LBBB following VT arrest is sparse however his presentation does not fit primary ACS at this point.  I do not think there is currently an emergent indication for coronary angiography.  Favor his troponin elevation to be demand ischemia however limited echo this morning would be helpful to assess wall motion abnormalities, especially if he has further clinical decompensation.  Will ask the rounding team to see him today.

## 2020-11-30 NOTE — ED Provider Notes (Signed)
I was asked to place a central line for ongoing pressor support. I took part in no other portions of patient's ED or hospital treatment/decisions.     Linus Salmons Line  Date/Time: 11/30/2020 2:12 AM Performed by: Merrily Pew, MD Authorized by: Merrily Pew, MD   Consent:    Consent obtained:  Emergent situation   Risks discussed:  Incorrect placement, infection, bleeding, arterial puncture, nerve damage and pneumothorax   Alternatives discussed:  No treatment, delayed treatment, alternative treatment and observation Universal protocol:    Test results available: yes     Imaging studies available: yes     Patient identity confirmed:  Arm band Pre-procedure details:    Indication(s): central venous access and insufficient peripheral access     Hand hygiene: Hand hygiene performed prior to insertion     Sterile barrier technique: All elements of maximal sterile technique followed     Skin preparation:  2% chlorhexidine   Skin preparation agent: Skin preparation agent completely dried prior to procedure   Sedation:    Sedation type:  None Anesthesia:    Anesthesia method:  None Procedure details:    Location:  L internal jugular   Site selection rationale:  Safest, equipment in way on right   Patient position:  Trendelenburg   Procedural supplies:  Triple lumen   Catheter size:  7 Fr   Ultrasound guidance: yes     Ultrasound guidance timing: prior to insertion and real time     Sterile ultrasound techniques: Sterile gel and sterile probe covers were used     Number of attempts:  1   Successful placement: yes   Post-procedure details:    Post-procedure:  Dressing applied and line sutured   Assessment:  Blood return through all ports, free fluid flow and no pneumothorax on x-ray   Procedure completion:  Tolerated well, no immediate complications         Monick Rena, Corene Cornea, MD 11/30/20 [phone]

## 2020-11-30 NOTE — Progress Notes (Addendum)
ANTICOAGULATION CONSULT NOTE -   Pharmacy Consult for Heparin Indication: chest pain/ACS  No Known Allergies  Patient Measurements: Height: '5\' 11"'$  (180.3 cm) Weight: 89.9 kg (198 lb 3.1 oz) IBW/kg (Calculated) : 75.3   Vital Signs: Temp: 98.6 F (37 C) (08/29 1100) Temp Source: Axillary (08/29 1100) BP: 119/63 (08/29 1100) Pulse Rate: 64 (08/29 1100)  Labs: Recent Labs    11/29/20 0023 11/29/20 0023 11/29/20 2243 11/30/20 0224 11/30/20 0225 11/30/20 0503 11/30/20 0730 11/30/20 0954  HGB 14.5  --  14.7  --  13.6  --   --   --   HCT 43.1  --  44.5  --  41.8  --   --   --   PLT 303  --  415*  --  377  --   --   --   HEPARINUNFRC  --   --   --   --   --   --   --  0.15*  CREATININE 1.21  1.22  --  1.19 1.30*  --   --   --   --   TROPONINIHS  --    < > 116* 263*  --  306* 285*  --    < > = values in this interval not displayed.     Estimated Creatinine Clearance: 51.5 mL/min (A) (by C-G formula based on SCr of 1.3 mg/dL (H)).   Medical History: Past Medical History:  Diagnosis Date   Arthritis    BPH (benign prostatic hyperplasia)    CKD (chronic kidney disease), stage II    Complicated UTI (urinary tract infection) 03/2014   Depression    GERD (gastroesophageal reflux disease)    History of pneumonia    History of stroke    HOH (hard of hearing)    Hypertension    Hyponatremia    Lower GI bleed 2017   a. ? due to polyp.   NSVT (nonsustained ventricular tachycardia) (H[facility])    Rheumatic fever    Childhood   Sleep apnea    Does not use CPAP   Type 2 diabetes mellitus ([facility])     Medications:  Scheduled:   bisacodyl  10 mg Rectal Once   bisacodyl  10 mg Rectal QHS   Chlorhexidine Gluconate Cloth  6 each Topical Daily   insulin aspart  0-5 Units Subcutaneous QHS   insulin aspart  0-6 Units Subcutaneous TID WC   sodium chloride flush  3 mL Intravenous Q12H   sodium chloride flush  3 mL Intravenous Q12H    Assessment: 75 y.o. male s/p cardiac arrest,  possible ACS, for heparin  Goal of Therapy:  Heparin level 0.3-0.7 units/ml Monitor platelets by anticoagulation protocol: Yes   Plan:  Rebolus heparin 2500 units IV x 1 Increase heparin infusion to 1250 units/hr. Check heparin level in 8 hours and daily Monitor H&H and s/s of bleeding.   Margot Ables, PharmD Clinical Pharmacist 11/30/2020 11:19 AM

## 2020-11-30 NOTE — Progress Notes (Addendum)
8/28 at 2152 RN called due to patient having frequent ectopies on the monitor, BMP and magnesium were ordered   Albert Garrison was called at 2233 due to patient being hypotensive and and bradycardic.  At bedside, patient only initially required rescue breaths due to having pulse.  Atropine was already given to patient.  Pulse was lost at 2237 and chest compression was started immediately, IV epinephrine was given and patient had a ROSC within 1 minute of ACLS.  Patient was intubated by ED physician (Dr. Melina Copa) and was started on IV Levophed and IV fentanyl.  He maxed out on Levophed and vasopressin was started peripherally (this was changed to central line (left IJ) by Dr. Dayna Barker) Lactic acid, BMP, troponin were checked Lactic acid 5.0 > 3.6 Troponin x 1-116 > 263 Procalcitonin 1.47 ABG shows pH 7.356, PCO2 34.7, PO2 127, bicarb 20.5 at FiO2 of 100%  CT head without contrast showed no acute intracranial abnormality CT abdomen and pelvis with contrast showed small bowel obstruction Chest x-ray showed endotracheal tube above the carina and enteric tube extends below the diaphragm with CP in the left upper abdomen likely in the gastric fundus  BP (!) 144/83   Pulse 74   Temp 100.2 F (37.9 C)   Resp (!) 22   Ht 5' 11" (1.803 m)   Wt 83.9 kg   SpO2 100%   BMI 25.80 kg/m   E-Link physician was consulted and recommended cardiology consult due to concern regarding patient's LBBB and questionable STEMI prior to code.  Aspirin and heparin drip was started by E-Link physician  Cardiologist on call at [facility] was consulted and there was no indication for transferring patient at this time due to patient not having STEMI.  Elevated troponin was thought to be due to type II demand ischemia considering brief chest compression and patient having a brief cardiac arrest.  It was recommended to continue to trend troponin. EKG done on 11/29/2020 at 2225 showed wide QRS at 75 bpm with LBBB   Drips: IV  fentanyl: 12.5 mcg per hour IV Levophed 37.17mg/min IV vasopressin: 0.03 units/min Amiodarone: 60 mcg/Hr  Assessment and plan  Acute cardiac arrest Patient was reported to have gone into V. tach prior to cardiac arrest.  Patient already had ROSC within 1 minute of brief CPR and IV epinephrine x1. [facility] IV amiodarone drip Echocardiogram done on 11/28/2020 showed LVEF of 55 to 60%, LV as no RWMA.  There is moderate concentric LVH.  LV diastolic parameters consistent with G1 DD Cardiology will be consulted and we shall await further recommendation  Acute respiratory failure with hypoxia in the setting of above Patient was intubated and sedated Continue mechanical ventilation at this time and attempt to wean patient off BiPAP as tolerated PCCM will be consulted and we shall await further recommendation  Elevated troponin possible secondary to type II demand ischemia  R/O NSTEMI Troponin 1-116 > 263 EKG showed normal sinus rhythm at a rate of 72 bpm with occasional PVCs Continue to trend troponin  Sepsis possibly due to bacteremia WBC was elevated at 12.9, patient was tachypneic (met SIRS criteria), patient was hypotensive and required pressors to maintain MAP > 65, lactic acid was elevated (met sepsis criteria) Procalcitonin was 1.47 Patient was empirically started on IV vancomycin and cefepime Blood culture pending  Lactic acidosis possibly due to multifactorial Lactic acid 5.0 > 3.6 Continue to trend lactic acid  Critical time: 440  minutes   Critical care personally provided  managing the patient due to high probability of clinically significant and life threatening deterioration. This critical care time included obtaining a history; examining the patient, pulse oximetry; ordering and review of studies; arranging urgent treatment with development of a management plan; evaluation of patient's response of treatment; frequent reassessment;  and discussions with other providers.  This critical care time was performed to assess and manage the high probability of imminent and life threatening deterioration that could result in multi-organ failure.

## 2020-11-30 NOTE — Progress Notes (Signed)
eLink Physician-Brief Progress Note Patient Name: Albert Garrison DOB: [birth year] MRN: [id number]   Date of Service  11/30/2020  HPI/Events of Note  Patient admitted to [facility] with hyponatremia related to protracted nausea and vomiting, subsequently found to have a small bowel obstruction, being treated conservatively with NG tube decompression of the bowel, subsequently briefly went into cardiac arrest but was successfully resuscitated, developed acute coronary syndrome (EKG changes and a Troponin that peaked at 309), patient was intubated for acute respiratory failure and transferred to [facility] hospital (20M ICU) for further work up and Rx.  eICU Interventions  New Patient Evaluation.        Kerry Kass Lonnie Reth 11/30/2020, 10:14 PM

## 2020-11-30 NOTE — Progress Notes (Addendum)
Palliative: Thank you for this consult.  Unfortunately due to high volume of consults there will be a delay in a Palliative Provider seeing this patient. Palliative Medicine will return to service on 12/01/2020 and will see patient at that time. Conference with attending, bedside nursing staff, transition of care team.  No charge  Quinn Axe, NP Palliative Medicine Please call Palliative Medicine team phone with any questions [phone]. For individual providers please see AMION.

## 2020-11-30 NOTE — Progress Notes (Signed)
Cardiac rhythm change. EKG acquired. Wide QRS with frequent consecutive PVCs and LBBB. Shown. MD called. Patient became completely unresponsive and began agonal breathing. Respiratory at bedside.

## 2020-11-30 NOTE — Progress Notes (Signed)
[place] Progress Note Patient Name: Albert Garrison DOB: 1946/02/09 MRN: [id number]   Date of Service  11/30/2020  HPI/Events of Note  Fever.  eICU Interventions  Tylenol dosing changed to Q 4 hours PRN.        Frederik Pear 11/30/2020, 11:49 PM

## 2020-11-30 NOTE — Progress Notes (Signed)
Had not seen patient yet.  [facility] came through door to take patient to [facility].  Called report to [facility], RRT covering that floor.

## 2020-11-30 NOTE — Consult Note (Signed)
Albert Garrison 18-Feb-1946  [id number].    Chief Complaint/Reason for Consult: small bowel obstruction  HPI:  Albert Garrison is a 75 yo male who was transferred from [facility] today after a cardiac arrest. At the time of my exam the patient was intubated and history was obtained from chart review. He initially presented to the ED on 8/26 with diarrhea and poor PO intake and was found to have severe hyponatremia. He later developed abdominal pain and distension with nausea and vomiting on 8/27, and an NG tube was placed with significant bilious output. A plain film was concerning for a small bowel obstruction. He was managed nonoperatively with NG decompression and has had bowel movements over the last several days. He has had altered mental status. A CT scan was done yesterday and showed a small bowel obstruction with a transition in the right mid-abdomen. Last night he became bradycardic and had a brief cardiac arrest with 1 minute of ACLS. He was intubated and started on pressors. He was also started on a heparin gtt due to concern for ACS. He was transfererd to [facility] today for further cardiac workup. He is currently on levophed at 6.  NG output has been very minimal since transfer.  The patient has no abdominal surgical history documented and no visible scars on his abdomen.  ROS: Review of Systems  Reason unable to perform ROS: intubated.   Family History  Problem Relation Age of Onset   Heart disease Sister    Hypertension Sister     Past Medical History:  Diagnosis Date   Arthritis    BPH (benign prostatic hyperplasia)    CKD (chronic kidney disease), stage II    Complicated UTI (urinary tract infection) 03/2014   Depression    GERD (gastroesophageal reflux disease)    History of pneumonia    History of stroke    HOH (hard of hearing)    Hypertension    Hyponatremia    Lower GI bleed 2017   a. ? due to polyp.   NSVT (nonsustained ventricular tachycardia) (HCC)     Rheumatic fever    Childhood   Sleep apnea    Does not use CPAP   Type 2 diabetes mellitus ([place])     Past Surgical History:  Procedure Laterality Date   CARPAL TUNNEL RELEASE Left 2010   CARPAL TUNNEL RELEASE Right 07/13/2017   Procedure: CARPAL TUNNEL RELEASE;  Surgeon: Carole Civil, MD;  Location: [facility] ORS;  Service: Orthopedics;  Laterality: Right;   CERVICAL SPINE SURGERY  2010   COLONOSCOPY WITH PROPOFOL N/A 11/30/2015   Procedure: COLONOSCOPY WITH PROPOFOL;  Surgeon: Daneil Dolin, MD;  Location: [facility] ENDO SUITE;  Service: Endoscopy;  Laterality: N/A;   KNEE ARTHROSCOPY  [place] surgery - broken nose     POLYPECTOMY  11/30/2015   Procedure: POLYPECTOMY;  Surgeon: Daneil Dolin, MD;  Location: [facility] ENDO SUITE;  Service: Endoscopy;;  polyp at ascending colon, rectal polyp   TRANSURETHRAL INCISION OF PROSTATE N/A 01/20/2015   Procedure: TRANSURETHRAL INCISION OF THE PROSTATE (TUIP);  Surgeon: Irine Seal, MD;  Location: [facility] ORS;  Service: Urology;  Laterality: N/A;    Social History:  reports that he quit smoking about 45 years ago. His smoking use included cigarettes. He has a 21.00 pack-year smoking history. He has never used smokeless tobacco. He reports current alcohol use of about 3.0 standard drinks per week. He  reports that he does not use drugs.  Allergies: No Known Allergies  Medications Prior to Admission  Medication Sig Dispense Refill   amLODipine (NORVASC) 10 MG tablet Take 1 tablet (10 mg total) by mouth daily. 30 tablet 1   aspirin 325 MG tablet Take 1 tablet (325 mg total) by mouth daily. 30 tablet 1   atorvastatin (LIPITOR) 40 MG tablet Take 40 mg by mouth daily.     metFORMIN (GLUCOPHAGE) 500 MG tablet Take 500 mg by mouth daily with breakfast.     naproxen (NAPROSYN) 500 MG tablet Take 1 tablet (500 mg total) by mouth 2 (two) times daily. (Patient taking differently: Take 500 mg by mouth 2 (two) times daily as needed for mild pain  or moderate pain.) 20 tablet 0   telmisartan (MICARDIS) 40 MG tablet Take 40 mg by mouth daily.        Physical Exam: Blood pressure (!) 165/62, pulse 93, temperature (!) 101.5 F (38.6 C), temperature source Axillary, resp. rate 20, height '5\' 11"'$  (1.803 m), weight 89.9 kg, SpO2 100 %. General: resting in bed, intubated Neurological: sedated HEENT: normocephalic, atraumatic, NG tube in place to suction CV: regular rate and rhythm, extremities warm and well-perfused Respiratory: intubated, on vent Abdomen: mildly distended but soft and compressible, no visible surgical scars Extremities: warm and well-perfused, no deformities Skin: warm and dry, no jaundice, no rashes or lesions    Assessment/Plan 75 yo male presenting as a transfer after a brief bradycardic arrest, with concern for a small bowel obstruction. I reviewed his recent CT scan done yesterday. There is a proximal small bowel distension with a transition zone. There is no pneumatosis or free, or other findings to suggest ischemia. Assessment of pain is limited by sedation and mental status, but the abdomen is very soft and exam is overall benign. Given his documented bowel movements over the last few days, this is most likely a partial obstruction. There does not seem to be an intraabdominal catastrophe, so no urgent surgical intervention is warranted, especially given the patient's other ongoing medical issues. Consider SBO protocol if the patient fails to progress. Surgery will follow.   Michaelle Birks, MD [facility], Hepatobiliary and Pancreatic Surgery 11/30/20 10:56 PM

## 2020-11-30 NOTE — Progress Notes (Signed)
Pharmacy Antibiotic Note  Albert Garrison is a 75 y.o. male with respiratory failure s/p cardiac arrest, possible sepsis.  Pharmacy has been consulted for Vancomycin and Cefepime  dosing.  Plan: Vancomycin 1500 mg IV now, then 750 mg IV q12h Cefepime 2 g IV q12h  Height: '5\' 11"'$  (180.3 cm) Weight: 83.9 kg (184 lb 15.5 oz) IBW/kg (Calculated) : 75.3  Temp (24hrs), Avg:99 F (37.2 C), Min:96.7 F (35.9 C), Max:100.2 F (37.9 C)  Recent Labs  Lab 11/27/20 0943 11/27/20 1918 11/28/20 0429 11/29/20 0023 11/29/20 2243  WBC 14.3*  --  12.3* 11.9* 15.8*  CREATININE 1.27* 1.20 1.07 1.21  1.22 1.19  LATICACIDVEN  --   --   --   --  5.0*    Estimated Creatinine Clearance: 56.2 mL/min (by C-G formula based on SCr of 1.19 mg/dL).    No Known Allergies   Caryl Pina 11/30/2020 12:40 AM

## 2020-11-30 NOTE — Progress Notes (Signed)
*  PRELIMINARY RESULTS* Echocardiogram Limited 2-D Echocardiogram  has been performed.  Albert Garrison 11/30/2020, 11:42 AM

## 2020-11-30 NOTE — Progress Notes (Addendum)
[place] Progress Note Patient Name: DEAGLAN REISER DOB: 05-27-1945 MRN: [id number]   Date of Service  11/30/2020  HPI/Events of Note  discussed with CCM doc and [facility] Nursing supervisor. since he had LBBB and ? STEMI just prior to Code,  need Cardiology consultation stat. [facility] doesnot have Cardiology service. so, Supervisor is going to call Dr Cliffton Asters, hospitalist for discussion with CCM ground team for [facility] transfer for same. mean time ordering asa/heparin gtt.  please co ordinate same.  ECHO from 73 th EF normal, grade 1 diastolic dysfunction.   eICU Interventions  No major contraindications for heparin or asa.  Watch for bleeding.  Earlier [facility] no acute changes.      Intervention Category Intermediate Interventions: Other:;Diagnostic test evaluation  Elmer Sow 11/30/2020, 1:29 AM  2:49 AM Follow up. VS stable now.  Discussed with RN. Cardiology been consulted, advised amiodarone gtt. On Levo at 10, weaning down.

## 2020-11-30 NOTE — Consult Note (Addendum)
Cardiology Consultation:   Patient ID: GIOMAR ROHS MRN: [id number]; DOB: Jun 22, 1945  Admit date: 11/27/2020 Date of Consult: 11/30/2020  PCP:  Celene Squibb, MD    [facility] Providers Cardiologist:  Carlyle Dolly, MD        Patient Profile:   DATWAN MUSSELWHITE is a 75 y.o. male with a hx of CVA 2019, NSVT, DM2, hyponatremia, hypertension, and CKD stage II who is being seen 11/30/2020 for the evaluation of reported cardiac arrest at the request of Dr.Emokpae.  History of Present Illness:   Mr. Mohiuddin is a 75 yo male admitted with SBO, hyponatremia (Na 118 on 8/26), and encephalopathy.  Electrolytes treated and sodium improved, NG tube in place with surgical consultation and conservative management initially.  Patient noted to have bursts of NSVT by telemetry, echocardiogram obtained by Hospitalist team with LVEF 55 to 60% on August 27.  He has a known history of NSVT, followed by Dr. Harl Bowie.  Yesterday evening patient noted to have NSVT with somewhat more sustained runs, progressive IVCD by ECG, reportedly bradycardia and unresponsiveness.  He was treated with atropine and epinephrine, had compressions under a minute with ROSC, no shocks delivered.  He was intubated and did require initiation of pressors including Levophed and vasopressin, also started on amiodarone.  Case reviewed with cardiology fellow overnight.  Looks to have had an interventricular rhythm subsequently.  He is in sinus rhythm this morning and otherwise hemodynamically stable.  Patient last had a telemedicine visit with Dr. Harl Bowie 11/08/18 at which time dizziness not felt to be cardiac.  Lab work at the time showed potassium 3.9, sodium 132, BUN 1.19, lactic acid 5.0 and procalcitonin 1.47.  WBC 15.8.  Chest x-ray shows left IJ catheter in place, ET tube 4 cm above the carina, small bilateral pleural effusions with no edema.  Past Medical History:  Diagnosis Date   Arthritis    BPH (benign prostatic hyperplasia)     CKD (chronic kidney disease), stage II    Complicated UTI (urinary tract infection) 03/2014   Depression    GERD (gastroesophageal reflux disease)    History of pneumonia    History of stroke    HOH (hard of hearing)    Hypertension    Hyponatremia    Lower GI bleed 2017   a. ? due to polyp.   NSVT (nonsustained ventricular tachycardia) (HCC)    Rheumatic fever    Childhood   Sleep apnea    Does not use CPAP   Type 2 diabetes mellitus ([place])     Past Surgical History:  Procedure Laterality Date   CARPAL TUNNEL RELEASE Left 2010   CARPAL TUNNEL RELEASE Right 07/13/2017   Procedure: CARPAL TUNNEL RELEASE;  Surgeon: Carole Civil, MD;  Location: [facility];  Service: Orthopedics;  Laterality: Right;   CERVICAL SPINE SURGERY  2010   COLONOSCOPY WITH PROPOFOL N/A 11/30/2015   Procedure: COLONOSCOPY WITH PROPOFOL;  Surgeon: Daneil Dolin, MD;  Location: [facility];  Service: Endoscopy;  Laterality: N/A;   KNEE ARTHROSCOPY  Apex surgery - broken nose     POLYPECTOMY  11/30/2015   Procedure: POLYPECTOMY;  Surgeon: Daneil Dolin, MD;  Location: [facility];  Service: Endoscopy;;  polyp at ascending colon, rectal polyp   TRANSURETHRAL INCISION OF PROSTATE N/A 01/20/2015   Procedure: TRANSURETHRAL INCISION OF THE PROSTATE (TUIP);  Surgeon: Irine Seal, MD;  Location: [facility];  Service: Urology;  Laterality: N/A;     Home Medications:  Prior to Admission medications   Medication Sig Start Date End Date Taking? Authorizing Provider  amLODipine (NORVASC) 10 MG tablet Take 1 tablet (10 mg total) by mouth daily. 08/25/17  Yes Kathie Dike, MD  aspirin 325 MG tablet Take 1 tablet (325 mg total) by mouth daily. 08/25/17  Yes Kathie Dike, MD  atorvastatin (LIPITOR) 40 MG tablet Take 40 mg by mouth daily.   Yes [provider]  metFORMIN (GLUCOPHAGE) 500 MG tablet Take 500 mg by mouth daily with breakfast.   Yes [provider]   naproxen (NAPROSYN) 500 MG tablet Take 1 tablet (500 mg total) by mouth 2 (two) times daily. Patient taking differently: Take 500 mg by mouth 2 (two) times daily as needed for mild pain or moderate pain. 06/24/20  Yes Idol, Almyra Free, PA-C  telmisartan (MICARDIS) 40 MG tablet Take 40 mg by mouth daily.  10/16/18  Yes [provider]    Inpatient Medications: Scheduled Meds:  bisacodyl  10 mg Rectal Once   bisacodyl  10 mg Rectal QHS   Chlorhexidine Gluconate Cloth  6 each Topical Daily   insulin aspart  0-5 Units Subcutaneous QHS   insulin aspart  0-6 Units Subcutaneous TID WC   sodium chloride flush  3 mL Intravenous Q12H   sodium chloride flush  3 mL Intravenous Q12H   Continuous Infusions:  sodium chloride 100 mL/hr at 11/30/20 0708   sodium chloride     sodium chloride Stopped (11/30/20 0009)   amiodarone 30 mg/hr (11/30/20 0839)   ceFEPime (MAXIPIME) IV 2 g (11/30/20 0911)   dexmedetomidine (PRECEDEX) IV infusion Stopped (11/29/20 2227)   fentaNYL infusion INTRAVENOUS 125 mcg/hr (11/30/20 0616)   heparin 1,000 Units/hr (11/30/20 0306)   norepinephrine (LEVOPHED) Adult infusion 10 mcg/min (11/30/20 0616)   vancomycin     vasopressin 0.03 Units/min (11/30/20 0616)   PRN Meds: sodium chloride, acetaminophen **OR** acetaminophen, bisacodyl, labetalol, LORazepam, ondansetron **OR** ondansetron (ZOFRAN) IV, sodium chloride flush  Allergies:   No Known Allergies  Social History:   Social History   Tobacco Use   Smoking status: Former    Packs/day: 1.50    Years: 14.00    Pack years: 21.00    Types: Cigarettes    Quit date: 08/06/1975    Years since quitting: 45.3   Smokeless tobacco: Never  Substance Use Topics   Alcohol use: Yes    Alcohol/week: 3.0 standard drinks    Types: 3 Cans of beer per week    Comment: 3 12 oz cans of beer each week.None since 03/2014     Family History:     Family History  Problem Relation Age of Onset   Heart disease Sister     Hypertension Sister      ROS:  Please see the history of present illness.  Review of Systems  Reason unable to perform ROS: intubated.   All other ROS reviewed and negative.     Physical Exam/Data:   Vitals:   11/30/20 0700 11/30/20 0715 11/30/20 0755 11/30/20 0800  BP: (!) 128/59 (!) 116/58  (!) 112/56  Pulse: 60 62 (!) 59 (!) 58  Resp: '19 18 18 18  '$ Temp:      TempSrc:      SpO2: 100% 100% 100% 100%  Weight:      Height:        Intake/Output Summary (Last 24 hours) at 11/30/2020 0953 Last data filed at 11/30/2020 [id number] Gross  per 24 hour  Intake 3514.55 ml  Output 1751 ml  Net 1763.55 ml   Last 3 Weights 11/30/2020 11/29/2020 11/28/2020  Weight (lbs) 198 lb 3.1 oz 184 lb 15.5 oz 195 lb 1.7 oz  Weight (kg) 89.9 kg 83.9 kg 88.5 kg     Body mass index is 27.64 kg/m.  General: Elderly male, on ventilator. HEENT: normal Lymph: no adenopathy Neck: no JVD Endocrine:  No thryomegaly Vascular: No carotid bruits; FA pulses 2+ bilaterally without bruits  Cardiac:  normal S1, S2; RRR; no murmur   Lungs:  clear to auscultation anteriorly, no wheezing, rhonchi or rales  Abd: distended Ext: no edema Skin: warm and dry  Neuro: Opens eyes and follows some commands, sedated.  Telemetry:  Telemetry was personally reviewed and demonstrates:  Sinus bradycardia  Relevant CV Studies:   TTE  Result date: 11/28/20  1. Left ventricular ejection fraction, by estimation, is 55 to 60%. The  left ventricle has normal function. The left ventricle has no regional  wall motion abnormalities. There is moderate concentric left ventricular  hypertrophy. Left ventricular  diastolic parameters are consistent with Grade I diastolic dysfunction  (impaired relaxation).   2. Right ventricular systolic function is normal. The right ventricular  size is normal. Tricuspid regurgitation signal is inadequate for assessing  PA pressure.   3. The mitral valve is normal in structure. Trivial mitral valve   regurgitation. No evidence of mitral stenosis.   4. The aortic valve is tricuspid. Aortic valve regurgitation is mild.   5. Aortic dilatation noted. There is mild dilatation of the aortic root,  measuring 43 mm.   08/2017 echo Study Conclusions   - Left ventricle: Anbormal septal motion The cavity size was   normal. Wall thickness was increased in a pattern of moderate   LVH. Systolic function was normal. The estimated ejection   fraction was in the range of 55% to 60%. Wall motion was normal;   there were no regional wall motion abnormalities. Left   ventricular diastolic function parameters were normal. - Aortic valve: There was mild regurgitation. - Left atrium: The atrium was mildly dilated. - Atrial septum: No defect or patent foramen ovale was identified.     08/2017 carotid US IMPRESSION: 1. Mild (1-49%) stenosis proximal left internal carotid artery secondary to heterogenous atherosclerotic plaque. 2. No significant atherosclerotic plaque or stenosis in the right internal carotid artery. 3. The bilateral vertebral arteries are patent with normal antegrade flow.   Laboratory Data:  High Sensitivity Troponin:   Recent Labs  Lab 11/29/20 2243 11/30/20 0224 11/30/20 0503 11/30/20 0730  TROPONINIHS 116* 263* 306* 285*     Chemistry Recent Labs  Lab 11/29/20 0023 11/29/20 2243 11/30/20 0224  NA 131*  130* 132* 130*  K 3.3*  3.3* 3.9 4.5  CL 96*  96* 99 100  CO2 25  24 21* 21*  GLUCOSE 172*  174* 186* 242*  BUN '20  20 20 22  '$ CREATININE 1.21  1.22 1.19 1.30*  CALCIUM 8.4*  8.3* 7.7* 7.3*  GFRNONAA >60  >60 >60 57*  ANIONGAP '10  10 12 9    '$ Recent Labs  Lab 11/27/20 0943 11/29/20 0023 11/30/20 0224  PROT 7.0  --  5.6*  ALBUMIN 3.6 3.2* 2.7*  AST 19  --  32  ALT 16  --  21  ALKPHOS 51  --  44  BILITOT 1.8*  --  1.4*   Hematology Recent Labs  Lab 11/29/20 0023 11/29/20  2243 11/30/20 0225  WBC 11.9* 15.8* 12.9*  RBC 4.74 4.75 4.46  HGB  14.5 14.7 13.6  HCT 43.1 44.5 41.8  MCV 90.9 93.7 93.7  MCH 30.6 30.9 30.5  MCHC 33.6 33.0 32.5  RDW 13.2 13.1 13.3  PLT 303 415* 377    Radiology/Studies:  DG Chest 1 View  Result Date: 11/29/2020 CLINICAL DATA:  Status post intubation. EXAM: CHEST  1 VIEW COMPARISON:  Chest radiograph dated 11/29/2020. FINDINGS: Endotracheal tube approximately 7 cm above the carina. Enteric tube extends below the diaphragm with tip in the left upper abdomen likely in the gastric fundus. Bibasilar streaky atelectasis. Developing infiltrate is less likely. No focal consolidation, pleural effusion or pneumothorax. Stable cardiac silhouette. Probable hiatal hernia. No acute osseous pathology. Cervical fusion hardware. IMPRESSION: Endotracheal tube above the carina. Electronically Signed   By: Anner Crete M.D.   On: 11/29/2020 23:10   CT HEAD WO CONTRAST (5MM)  Result Date: 11/29/2020 CLINICAL DATA:  Encephalopathy EXAM: CT HEAD WITHOUT CONTRAST TECHNIQUE: Contiguous axial images were obtained from the base of the skull through the vertex without intravenous contrast. COMPARISON:  08/22/2017 FINDINGS: Brain: There is no mass, hemorrhage or extra-axial collection. There is generalized atrophy without lobar predilection. Hypodensity of the white matter is most commonly associated with chronic microvascular disease. 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 Vascular: No abnormal hyperdensity of the major intracranial arteries or dural venous sinuses. No intracranial atherosclerosis. Skull: The visualized skull base, calvarium and extracranial soft tissues are normal. Sinuses/Orbits: No fluid levels or advanced mucosal thickening of the visualized paranasal sinuses. No mastoid or middle ear effusion. The orbits are normal. IMPRESSION: 1. No acute intracranial abnormality. 2. Generalized atrophy and chronic microvascular ischemia. Electronically Signed   By: Ulyses Jarred M.D.   On: 11/29/2020  19:31   CT ABDOMEN PELVIS W CONTRAST  Result Date: 11/29/2020 CLINICAL DATA:  Bowel obstruction suspected. C.o decreased appetite, nausea, poor p.o. intake. He states that he was constipated for the last few days but last night began experiencing intermittent diarrhea. HX CKD, HTN, GI bleed, DM EXAM: CT ABDOMEN AND PELVIS WITH CONTRAST TECHNIQUE: Multidetector CT imaging of the abdomen and pelvis was performed using the standard protocol following bolus administration of intravenous contrast. CONTRAST:  30m OMNIPAQUE IOHEXOL 350 MG/ML SOLN COMPARISON:  None. FINDINGS: Lines and tubes: Enteric tube noted coursing below the hemidiaphragm with tip within the gastric lumen and side port at the gastroesophageal junction. Foley catheter tip and balloon terminate within the urinary bladder. Lower chest: Trace bilateral pleural effusions, left greater than right. Hepatobiliary: The hepatic parenchyma is diffusely hypodense compared to the splenic parenchyma consistent with fatty infiltration. No focal liver abnormality. No gallstones, gallbladder wall thickening, or pericholecystic fluid. No biliary dilatation. Pancreas: Diffusely atrophic. No focal lesion. Otherwise normal pancreatic contour. No surrounding inflammatory changes. No main pancreatic ductal dilatation. Spleen: Normal in size without focal abnormality. Adrenals/Urinary Tract: No adrenal nodule bilaterally. Bilateral kidneys enhance symmetrically. Bilateral renal cortical scarring. Pericentimeter fluid density lesion within the right kidney likely represents a simple renal cyst. Subcentimeter hypodensity within left kidney too small to characterize. No hydronephrosis. No hydroureter. The urinary bladder is decompressed. Stomach/Bowel: Stomach is within normal limits. Diffuse fluid dilatation of the proximal and mid small bowel with a likely transition point within the right mid abdomen (5:42). The distal small bowel is decompressed. The large bowel is  decompressed. No evidence of large bowel wall thickening or dilatation. Diffuse left  colon and sigmoid diverticulosis. Appendix appears normal. Vascular/Lymphatic: No abdominal aorta or iliac aneurysm. Severe atherosclerotic plaque of the aorta and its branches. No abdominal, pelvic, or inguinal lymphadenopathy. Reproductive: Prostate is unremarkable. Other: Trace free fluid within the pelvis. No intraperitoneal free gas. No organized fluid collection. Musculoskeletal: Bilateral trace volume inguinal hernias. No suspicious lytic or blastic osseous lesions. No acute displaced fracture. Limited evaluation of the visualized ribs due to motion artifact. Multilevel degenerative changes of the spine. IMPRESSION: 1. Small-bowel obstruction with a likely transition point within the right mid abdomen. 2. Enteric tube with tip within the gastric lumen side port but side port at the gastroesophageal junction. Consider advancing by 3 cm. 3. Colonic diverticulosis with no acute diverticulitis. 4. Bilateral trace pleural effusions. 5.  Aortic Atherosclerosis (ICD10-I70.0). Electronically Signed   By: Iven Finn M.D.   On: 11/29/2020 20:20   DG CHEST PORT 1 VIEW  Result Date: 11/30/2020 CLINICAL DATA:  Status post PICC line placement EXAM: PORTABLE CHEST 1 VIEW COMPARISON:  11/29/2020 FINDINGS: Left IJ venous catheter terminates in the upper SVC. Endotracheal tube terminates 4 cm above the carina. Enteric tube terminates in the gastric cardia. No frank interstitial edema. Small bilateral pleural effusions, right greater than left. The heart is normal in size. Defibrillator pad overlying the left hemithorax. Cervical spine fixation hardware, incompletely visualized. IMPRESSION: Left IJ venous catheter terminates in the upper SVC. Otherwise unchanged. Electronically Signed   By: Julian Hy M.D.   On: 11/30/2020 02:14   DG CHEST PORT 1 VIEW  Result Date: 11/29/2020 CLINICAL DATA:  Altered mental status. NG tube  placement verification. EXAM: PORTABLE CHEST 1 VIEW COMPARISON:  Chest x-ray dated 11/28/2020. FINDINGS: Enteric tube appears adequately positioned in the stomach. Heart size and mediastinal contours are stable. Lungs are clear. No pleural effusion is seen. IMPRESSION: Enteric tube appears adequately positioned in the stomach. No active disease. Electronically Signed   By: Franki Cabot M.D.   On: 11/29/2020 09:51   DG CHEST PORT 1 VIEW  Result Date: 11/28/2020 CLINICAL DATA:  75 year old male status post nasogastric tube placement EXAM: PORTABLE CHEST 1 VIEW COMPARISON:  Prior radiograph obtained earlier today FINDINGS: Interval placement of a nasogastric tube. The tube appears well positioned overlying the gastric fundus. Inspiratory volumes are low with bibasilar atelectasis. No evidence of pneumothorax or large effusion. Incompletely imaged dilated small bowel in the upper abdomen suggests underlying bowel obstruction. IMPRESSION: Well-positioned nasogastric tube. Electronically Signed   By: Jacqulynn Cadet M.D.   On: 11/28/2020 10:15   DG ABD ACUTE 2+V W 1V CHEST  Result Date: 11/28/2020 CLINICAL DATA:  Nausea, vomiting, abdominal pain EXAM: DG ABDOMEN ACUTE WITH 1 VIEW CHEST COMPARISON:  08/28/2018 FINDINGS: Relatively low lung volumes with crowding of infrahilar bronchovascular structures Heart size upper limits normal for technique. No definite effusion. No pneumothorax. No free air. Multiple gas dilated small bowel loops in the abdomen. The colon is decompressed. Aortic Atherosclerosis (ICD10-170.0). Mild spondylitic changes in the lumbar spine. IMPRESSION: 1. Dilated small bowel loops suggesting distal small bowel obstruction. Electronically Signed   By: Lucrezia Europe M.D.   On: 11/28/2020 08:51   ECHOCARDIOGRAM COMPLETE  Result Date: 11/28/2020    ECHOCARDIOGRAM REPORT   Patient Name:   NASR DOOMS Date of Exam: 11/28/2020 Medical Rec #:  [id number]       Height:       71.0 in Accession #:     [id number]      Weight:  195.1 lb Date of Birth:  08-31-1945       BSA:          2.086 m Patient Age:    64 years        BP:           167/82 mmHg Patient Gender: M               HR:           65 bpm. Exam Location:  [facility] Procedure: 2D Echo, Cardiac Doppler and Color Doppler Indications:    R94.31 Abnormal EKG  History:        Patient has prior history of Echocardiogram examinations, most                 recent 08/23/2017. Risk Factors:Diabetes and Hypertension.  Sonographer:    Bernadene Person RDCS Referring Phys: [place]  1. Left ventricular ejection fraction, by estimation, is 55 to 60%. The left ventricle has normal function. The left ventricle has no regional wall motion abnormalities. There is moderate concentric left ventricular hypertrophy. Left ventricular diastolic parameters are consistent with Grade I diastolic dysfunction (impaired relaxation).  2. Right ventricular systolic function is normal. The right ventricular size is normal. Tricuspid regurgitation signal is inadequate for assessing PA pressure.  3. The mitral valve is normal in structure. Trivial mitral valve regurgitation. No evidence of mitral stenosis.  4. The aortic valve is tricuspid. Aortic valve regurgitation is mild.  5. Aortic dilatation noted. There is mild dilatation of the aortic root, measuring 43 mm. Comparison(s): A prior study was performed on 08/23/2017. Slight increase in aortic root. FINDINGS  Left Ventricle: Left ventricular ejection fraction, by estimation, is 55 to 60%. The left ventricle has normal function. The left ventricle has no regional wall motion abnormalities. The left ventricular internal cavity size was normal in size. There is  moderate concentric left ventricular hypertrophy. Left ventricular diastolic parameters are consistent with Grade I diastolic dysfunction (impaired relaxation). Right Ventricle: The right ventricular size is normal. No increase in right ventricular wall  thickness. Right ventricular systolic function is normal. Tricuspid regurgitation signal is inadequate for assessing PA pressure. Left Atrium: Left atrial size was normal in size. Right Atrium: Right atrial size was normal in size. Pericardium: There is no evidence of pericardial effusion. Mitral Valve: The mitral valve is normal in structure. Trivial mitral valve regurgitation. No evidence of mitral valve stenosis. Tricuspid Valve: The tricuspid valve is normal in structure. Tricuspid valve regurgitation is not demonstrated. Aortic Valve: The aortic valve is tricuspid. Aortic valve regurgitation is mild. Aortic regurgitation PHT measures 1048 msec. Pulmonic Valve: The pulmonic valve was not well visualized. Pulmonic valve regurgitation is mild. No evidence of pulmonic stenosis. Aorta: Aortic dilatation noted. There is mild dilatation of the aortic root, measuring 43 mm. IAS/Shunts: The atrial septum is grossly normal.  LEFT VENTRICLE PLAX 2D LVIDd:         4.97 cm  Diastology LVIDs:         3.34 cm  LV e' medial:    5.40 cm/s LV PW:         1.38 cm  LV E/e' medial:  5.9 LV IVS:        1.39 cm  LV e' lateral:   6.23 cm/s LVOT diam:     2.20 cm  LV E/e' lateral: 5.1 LV SV:         100 LV SV Index:   48 LVOT  Area:     3.80 cm  RIGHT VENTRICLE RV S prime:     14.30 cm/s TAPSE (M-mode): 1.9 cm LEFT ATRIUM             Index       RIGHT ATRIUM          Index LA diam:        2.50 cm 1.20 cm/m  RA Area:     8.74 cm LA Vol (A2C):   33.2 ml 15.91 ml/m RA Volume:   11.90 ml 5.70 ml/m LA Vol (A4C):   32.7 ml 15.67 ml/m LA Biplane Vol: 33.5 ml 16.06 ml/m  AORTIC VALVE LVOT Vmax:   108.00 cm/s LVOT Vmean:  68.500 cm/s LVOT VTI:    0.262 m AI PHT:      1048 msec  AORTA Ao Root diam: 4.30 cm Ao Asc diam:  3.80 cm MITRAL VALVE MV Area (PHT): 1.87 cm    SHUNTS MV Decel Time: 405 msec    Systemic VTI:  0.26 m MV E velocity: 31.70 cm/s  Systemic Diam: 2.20 cm MV A velocity: 72.00 cm/s MV E/A ratio:  0.44 Rudean Haskell MD  Electronically signed by Rudean Haskell MD Signature Date/Time: 11/28/2020/3:42:26 PM    Final      Assessment and Plan:   Cardiac arrest, based on available information looks to have been bradycardic at the time, not necessarily in VT, in the setting of hypoxia, obtunded, persistent encephalopathy, K 3.3, low sats, bradycardia at 46 treated with atropine, responded to 1 min CPR and epi, post arrest idioventricular rhythm, troponins 263, 306, 285. Currently on IV Levophed, vasopressin, amiodarone, and fentanyl. BP has stabilized. HR 50-60's. Would continue amio for now until he is weaned off fentanyl and pressors. No indication for acute cath at this point. Check limited echo for LVEF and repeat ekg this am.   History of NSVT  HTN on amlodipine and telmisartan PTA  CVA 08/2017  Hyponatremia 118 on admission corrected.  DM2n setting of SBO  SBO NG tube in place  CKD Crt 1.3   Risk Assessment/Risk Scores:     TIMI Risk Score for Unstable Angina or Non-ST Elevation MI:   The patient's TIMI risk score is 3, which indicates a 13% risk of all cause mortality, new or recurrent myocardial infarction or need for urgent revascularization in the next 14 days.   For questions or updates, please contact [place] Please consult [email or web address] for contact info under   Electrolytes improved with treatment, NG tube placed with initial conservative management of SBO per surgical team.  Coral Else, PA-C 11/30/2020 9:53 AM   Attending note:  Patient seen and examined.  I reviewed the chart as well as prior history, also previous notes from Dr. Harl Bowie.  Mr. Greenlaw is now admitted with SBO in association with encephalopathy and severe hyponatremia presentation.  Electrolytes improved with treatment, NG tube placed with initial conservative management of SBO by surgical team.  Yesterday had bradycardic arrest as described above, had also been having more frequent runs of  NSVT and per my review of telemetry he has had episodes of SVT as well.  Precipitant for event not clear but he has had altered mental status and was hypoxic.  Treated with atropine and epinephrine, CPR for 1 minute with ROSC.  No shocks required.  Now intubated and on Levophed, vasopressin, amiodarone, and fentanyl.  Does open his eyes and follow some commands.  Hemodynamically stable this morning.  Patient is afebrile, heart rate is in the 60s in sinus rhythm by telemetry.  Systolic blood pressure [id number].  Lungs clear anteriorly.  Cardiac exam with RRR and no gallop.  No peripheral edema.  Pertinent lab work includes sodium 130, potassium 4.5, BUN 22, creatinine 1.3, lactic acid 1.3, high-sensitivity troponin I flat in the 200-300 range, WBC 12.9.  Last ECG with possible idioventricular rhythm.  Has otherwise been in sinus.  Chest x-ray shows small bilateral pleural effusions without edema, left IJ catheter in place and ETT 4 cm above the carina.  Discussed with Dr. Denton Brick.  Continue supportive measures, currently weaning off pressors.  PCCM service following with rounds pending.  From a cardiac perspective we will get a follow-up limited echocardiogram for reassessment of LVEF after recent event.  Continue IV amiodarone for the time being, most likely will be able to discontinue depending on clinical course in the next 24 to 48 hours.  Urgent ischemic testing not indicated at this time.  May be best to move him to the critical care service at [facility] for further management and work-up.  Particularly, if surgical intervention for SBO is ultimately considered.  Satira Sark, M.D., F.A.C.C.

## 2020-11-30 NOTE — Progress Notes (Addendum)
Initial Nutrition Assessment  DOCUMENTATION CODES:      INTERVENTION:   RD will follow pt progression.  If pt becomes appropriate for enteral feeding: Recommend initiate Vital HP @ 40 ml/hr via OGT and increase by 10 ml every 6 hours to goal rate of 70 ml/hr (1680 ml).   Tube feeding regimen provides 1680 kcal (100% of needs), 146 grams of protein, and 1404 ml of H2O.    NUTRITION DIAGNOSIS:   Inadequate oral intake related to inability to eat as evidenced by NPO status.   GOAL:  Provide needs based on ASPEN/SCCM guidelines   MONITOR:  TF tolerance, Labs, I & O's  REASON FOR ASSESSMENT:   Ventilator    ASSESSMENT: Patient is a 75 yo male with hx of HTN, DM2, GERD, HOH, Hyponatremia. He presents with SBO (Per CT findings), acute encephalopathy.  Patient is currently intubated on ventilator 8/28 @ 2300. Requiring pressor support. OGT for decompression currently. Surgery following.   MV: 10.6 L/min Temp (24hrs), Avg:98.7 F (37.1 C), Min:96.7 F (35.9 C), Max:100.2 F (37.9 C)  Precedex   Medications reviewed and include: dulcolax, insulin IVF- NS '@100'$  ml/hr  Drips: Maxipime   Intake/Output Summary (Last 24 hours) at 11/30/2020 1050 Last data filed at 11/30/2020 0616 Gross per 24 hour  Intake 3514.55 ml  Output 1751 ml  Net 1763.55 ml     Labs: BMP Latest Ref Rng & Units 11/30/2020 11/29/2020 11/29/2020  Glucose 70 - 99 mg/dL 242(H) 186(H) 172(H)  BUN 8 - 23 mg/dL '22 20 20  '$ Creatinine 0.61 - 1.24 mg/dL 1.30(H) 1.19 1.21  Sodium 135 - 145 mmol/L 130(L) 132(L) 131(L)  Potassium 3.5 - 5.1 mmol/L 4.5 3.9 3.3(L)  Chloride 98 - 111 mmol/L 100 99 96(L)  CO2 22 - 32 mmol/L 21(L) 21(L) 25  Calcium 8.9 - 10.3 mg/dL 7.3(L) 7.7(L) 8.4(L)     NUTRITION - FOCUSED PHYSICAL EXAM: Unable to complete Nutrition-Focused physical exam at this time.    Diet Order:   Diet Order             Diet NPO time specified  Diet effective now                   EDUCATION  NEEDS:  Not appropriate for education at this time  Skin:  Skin Assessment: Reviewed RN Assessment  Last BM:  8/28  Height:   Ht Readings from Last 1 Encounters:  11/29/20 '5\' 11"'$  (1.803 m)    Weight:   Wt Readings from Last 1 Encounters:  11/30/20 89.9 kg    Ideal Body Weight:   78 kg  BMI:  Body mass index is 27.64 kg/m.  Estimated Nutritional Needs:   Kcal:  [id number]  Protein:  133-148 gr  Fluid:  >1700 ml daily  Colman Cater MS,RD,CSG,LDN Contact: Shea Evans

## 2020-11-30 NOTE — Progress Notes (Signed)
ANTICOAGULATION CONSULT NOTE  Pharmacy Consult for Heparin Indication: chest pain/ACS  No Known Allergies  Patient Measurements: Height: '5\' 11"'$  (180.3 cm) Weight: 89.9 kg (198 lb 3.1 oz) IBW/kg (Calculated) : 75.3   Vital Signs: Temp: 101.5 F (38.6 C) (08/29 1947) Temp Source: Axillary (08/29 1947) BP: 165/62 (08/29 2000) Pulse Rate: 93 (08/29 2134)  Labs: Recent Labs    11/29/20 0023 11/29/20 0023 11/29/20 2243 11/30/20 0224 11/30/20 0225 11/30/20 0503 11/30/20 0730 11/30/20 0954 11/30/20 2231  HGB 14.5  --  14.7  --  13.6  --   --   --   --   HCT 43.1  --  44.5  --  41.8  --   --   --   --   PLT 303  --  415*  --  377  --   --   --   --   HEPARINUNFRC  --   --   --   --   --   --   --  0.15* <0.10*  CREATININE 1.21  1.22  --  1.19 1.30*  --   --   --   --   --   TROPONINIHS  --    < > 116* 263*  --  306* 285*  --   --    < > = values in this interval not displayed.     Estimated Creatinine Clearance: 51.5 mL/min (A) (by C-G formula based on SCr of 1.3 mg/dL (H)).  Assessment: 75 y.o. male s/p cardiac arrest, possible ACS, for heparin  Goal of Therapy:  Heparin level 0.3-0.7 units/ml Monitor platelets by anticoagulation protocol: Yes   Plan:  Heparin 2000 units IV bolus, then increase heparin 1500 units/hr Follow-up am labs.  Phillis Knack, PharmD, BCPS  11/30/2020 11:34 PM

## 2020-11-30 NOTE — Code Documentation (Signed)
Code called at 2233. Patient hypotensive and bradycardic with pulse at this point 2233. Atropine given at 2235. Pulse lost at 2237, Compressions started.  Epinephrine given at 2238. Pulse felt at 2238.  First attempt at intubation 2240... unsuccessful due to NG curling. NG removed. '10mg'$  of Etomidate given 2242. Patient successfully intubated 2245 with positive color change noted.  New OG placement 2247 by ED MD.

## 2020-11-30 NOTE — Progress Notes (Signed)
Patient having frequent ectopy with runs of sustained vtach. MD Adefeso notified. Orders for BMP and Magnesium labs ordered.

## 2020-11-30 NOTE — Consult Note (Addendum)
NAME:  Albert Garrison, MRN:  [id number], DOB:  27-Oct-1945, LOS: 2 ADMISSION DATE:  11/27/2020, CONSULTATION DATE:11/30/20   REFERRING MD:  Epokapae/ [facility], CHIEF COMPLAINT:  post cardiac arrest   History of Present Illness:  34 yowm remote smoker originally admit with nausea/ vomiting diarrhea with Na 118 and glycosuria/ ketonuria with w/u c/w bowel obstruction and became bradycardic pm 8/28 not resp to atropine > 1 min cpr, better p epi and intubation> levophed dep and vent dep post arrest with cards/pulmonary consulations req am 8/29 and preparations to transfer to [facility].  Pertinent  Medical History  CKD II Hyponatremia - prior admit in 2015  Depression  CVA HOH HTN NSVT  OSA - does not use CPAP  Former Smoker - quit 1977, 21 pack years  Former ETOH - quit 2015  DM II  GERD  Lower GIB - thought secondary to polyp  Rheumatic Fever - childhood Heart Murmur     Significant Hospital Events: Including procedures, antibiotic start and stop dates in addition to other pertinent events   ET 8/28  L IJ 8/29   Interim History / Subjective:  Stabilize over the day on relatively low fio2 and titrating levophed down with ok uop  Objective   Blood pressure 139/64, pulse 65, temperature 99.4 F (37.4 C), temperature source Axillary, resp. rate 18, height '5\' 11"'$  (1.803 m), weight 89.9 kg, SpO2 100 %.    Vent Mode: PRVC FiO2 (%):  [40 %-100 %] 40 % Set Rate:  [18 bmp-22 bmp] 18 bmp Vt Set:  [600 mL] 600 mL PEEP:  [5 cmH20] 5 cmH20 Plateau Pressure:  [17 cmH20] 17 cmH20   Intake/Output Summary (Last 24 hours) at 11/30/2020 1622 Last data filed at 11/30/2020 1609 Gross per 24 hour  Intake 4324.04 ml  Output 1951 ml  Net 2373.04 ml   Filed Weights   11/28/20 0447 11/29/20 0326 11/30/20 0500  Weight: 88.5 kg 83.9 kg 89.9 kg    Examination: General: ederly wm sedated on vent HENT: oral et/og Lungs: distant bs bialterally Cardiovascular: RRR no s3 Abdomen: mod distended decreased  bowel sounds Extremities: cool s edema Neuro: sedated on vent but breathing over it      I personally reviewed images and agree with radiology impression as follows:  CXR:   portable  8/29 Left IJ venous catheter terminates in the upper SVC. My impression:  no as dz   Resolved Hospital Problem list     Assessment & Plan:  1) acute resp failure likely hypoxemic and hypercarbic related to SBO with ? Vasovagal cardiac arrest but no apparent lung injury  >>>  still breathing some over vent though back up rate a bit high so drop to PRVC 15 to avoid air trapping/ alkalosis   2) circulatory shock s/p intubation with cvp reading around 15 in absence of apparent compartment syndrome though will likely be at risk if he 3rd spaces more fluid into abd  >> for now follow cvp/uop  for fluid rx  3) hyponatremia trending much better on NS infusion and with correction of serum glucose  4) underlying bowel obst, may need lap but for now OG suction  5) s/p bradycardic arrest/ cards following   6) AODM poorly controlled  >>> SSI / keep hydrated.    Labs   CBC: Recent Labs  Lab 11/27/20 0943 11/28/20 0429 11/29/20 0023 11/29/20 2243 11/30/20 0225  WBC 14.3* 12.3* 11.9* 15.8* 12.9*  NEUTROABS 11.7*  --   --   --   --  HGB 14.8 15.6 14.5 14.7 13.6  HCT 42.5 44.2 43.1 44.5 41.8  MCV 89.3 88.0 90.9 93.7 93.7  PLT 232 299 303 415* [id number]    Basic Metabolic Panel: Recent Labs  Lab 11/27/20 1918 11/28/20 0429 11/29/20 0023 11/29/20 2243 11/30/20 0224  NA 123* 126* 131*  130* 132* 130*  K 3.7 3.7 3.3*  3.3* 3.9 4.5  CL 93* 93* 96*  96* 99 100  CO2 23 21* 25  24 21* 21*  GLUCOSE 232* 314* 172*  174* 186* 242*  BUN '16 17 20  20 20 22  '$ CREATININE 1.20 1.07 1.21  1.22 1.19 1.30*  CALCIUM 8.0* 8.6* 8.4*  8.3* 7.7* 7.3*  MG 1.8  --  2.4 2.5*  --   PHOS 2.0*  --  2.3*  --  2.6   GFR: Estimated Creatinine Clearance: 51.5 mL/min (A) (by C-G formula based on SCr of 1.3 mg/dL  (H)). Recent Labs  Lab 11/28/20 0429 11/29/20 0023 11/29/20 2243 11/30/20 0225 11/30/20 0503 11/30/20 0850  PROCALCITON  --   --  1.47  --   --   --   WBC 12.3* 11.9* 15.8* 12.9*  --   --   LATICACIDVEN  --   --  5.0* 3.6* 3.9* 1.3    Liver Function Tests: Recent Labs  Lab 11/27/20 0943 11/29/20 0023 11/30/20 0224  AST 19  --  32  ALT 16  --  21  ALKPHOS 51  --  44  BILITOT 1.8*  --  1.4*  PROT 7.0  --  5.6*  ALBUMIN 3.6 3.2* 2.7*   Recent Labs  Lab 11/27/20 0943  LIPASE 19   No results for input(s): AMMONIA in the last 168 hours.  ABG    Component Value Date/Time   PHART 7.358 11/30/2020 0530   PCO2ART 29.3 (L) 11/30/2020 0530   PO2ART 115 (H) 11/30/2020 0530   HCO3 18.2 (L) 11/30/2020 0530   ACIDBASEDEF 8.3 (H) 11/30/2020 0530   O2SAT 98.5 11/30/2020 0530     Coagulation Profile: No results for input(s): INR, PROTIME in the last 168 hours.  Cardiac Enzymes: No results for input(s): CKTOTAL, CKMB, CKMBINDEX, TROPONINI in the last 168 hours.  HbA1C: Hgb A1c MFr Bld  Date/Time Value Ref Range Status  11/27/2020 09:43 AM 8.6 (H) 4.8 - 5.6 % Final    Comment:    (NOTE) Pre diabetes:          5.7%-6.4%  Diabetes:              >6.4%  Glycemic control for   <7.0% adults with diabetes   08/22/2017 10:20 PM 7.1 (H) 4.8 - 5.6 % Final    Comment:    (NOTE) Pre diabetes:          5.7%-6.4% Diabetes:              >6.4% Glycemic control for   <7.0% adults with diabetes     CBG: Recent Labs  Lab 11/29/20 1805 11/30/20 0214 11/30/20 0611 11/30/20 1118 11/30/20 1606  GLUCAP 201* 259* 259* 276* 210*       Past Medical History:  He,  has a past medical history of Arthritis, BPH (benign prostatic hyperplasia), CKD (chronic kidney disease), stage II, Complicated UTI (urinary tract infection) (03/2014), Depression, GERD (gastroesophageal reflux disease), History of pneumonia, History of stroke, HOH (hard of hearing), Hypertension, Hyponatremia, Lower  GI bleed (2017), NSVT (nonsustained ventricular tachycardia) ([place]), Rheumatic fever, Sleep apnea, and Type 2 diabetes  mellitus ([place]).   Surgical History:   Past Surgical History:  Procedure Laterality Date   CARPAL TUNNEL RELEASE Left 2010   CARPAL TUNNEL RELEASE Right 07/13/2017   Procedure: CARPAL TUNNEL RELEASE;  Surgeon: Carole Civil, MD;  Location: [facility];  Service: Orthopedics;  Laterality: Right;   CERVICAL SPINE SURGERY  2010   COLONOSCOPY WITH PROPOFOL N/A 11/30/2015   Procedure: COLONOSCOPY WITH PROPOFOL;  Surgeon: Daneil Dolin, MD;  Location: [facility];  Service: Endoscopy;  Laterality: N/A;   KNEE ARTHROSCOPY  [place] surgery - broken nose     POLYPECTOMY  11/30/2015   Procedure: POLYPECTOMY;  Surgeon: Daneil Dolin, MD;  Location: [facility];  Service: Endoscopy;;  polyp at ascending colon, rectal polyp   TRANSURETHRAL INCISION OF PROSTATE N/A 01/20/2015   Procedure: TRANSURETHRAL INCISION OF THE PROSTATE (TUIP);  Surgeon: Irine Seal, MD;  Location: [facility];  Service: Urology;  Laterality: N/A;     Social History:   reports that he quit smoking about 45 years ago. His smoking use included cigarettes. He has a 21.00 pack-year smoking history. He has never used smokeless tobacco. He reports current alcohol use of about 3.0 standard drinks per week. He reports that he does not use drugs.   Family History:  His family history includes Heart disease in his sister; Hypertension in his sister.   Allergies No Known Allergies   Home Medications  Prior to Admission medications   Medication Sig Start Date End Date Taking? Authorizing Provider  amLODipine (NORVASC) 10 MG tablet Take 1 tablet (10 mg total) by mouth daily. 08/25/17  Yes Kathie Dike, MD  aspirin 325 MG tablet Take 1 tablet (325 mg total) by mouth daily. 08/25/17  Yes Kathie Dike, MD  atorvastatin (LIPITOR) 40 MG tablet Take 40 mg by mouth daily.   Yes [provider]  metFORMIN (GLUCOPHAGE) 500 MG tablet Take 500 mg by mouth daily with breakfast.   Yes [provider]  naproxen (NAPROSYN) 500 MG tablet Take 1 tablet (500 mg total) by mouth 2 (two) times daily. Patient taking differently: Take 500 mg by mouth 2 (two) times daily as needed for mild pain or moderate pain. 06/24/20  Yes Idol, Almyra Free, PA-C  telmisartan (MICARDIS) 40 MG tablet Take 40 mg by mouth daily.  10/16/18  Yes [provider]      The patient is critically ill with multiple organ systems failure and requires high complexity decision making for assessment and support, frequent evaluation and titration of therapies, application of advanced monitoring technologies and extensive interpretation of multiple databases. Critical Care Time devoted to patient care services described in this note is 45  minutes.   Christinia Gully, MD Pulmonary and [place] [phone]   After 7:00 pm call Elink  [phone]

## 2020-11-30 NOTE — Progress Notes (Signed)
Hospital Day 2 Subjective: Patient evaluated and examined at bedside this morning. Overnight he became hypotensive, bradycardic and was given atropine and rescue breaths. Developed sustained VT and pulse was lost. Chest compressions given along with IV epinephrine with return of spontaneous circulation in 1 min. Patient now on mechanical ventilation with OG tube in place with continued dark output. Patient reported to have a bowel movement yesterday. Patient will awake to voice and cooperate with examination.  Objective: Vital signs in last 24 hours: Temp:  [96.7 F (35.9 C)-100.2 F (37.9 C)] 98.7 F (37.1 C) (08/29 0600) Pulse Rate:  [29-95] 58 (08/29 0800) Resp:  [16-35] 18 (08/29 0800) BP: (99-209)/(43-146) 112/56 (08/29 0800) SpO2:  [93 %-100 %] 100 % (08/29 0800) FiO2 (%):  [55 %-100 %] 55 % (08/29 0800) Weight:  [89.9 kg] 89.9 kg (08/29 0500) Last BM Date: 11/27/20  Intake/Output from previous day: 08/28 0701 - 08/29 0700 In: 4169.3 [I.V.:2856.6; IV Piggyback:1312.7] Out: 1751 [Urine:1050; Emesis/NG output:700; Stool:1] Intake/Output this shift: No intake/output data recorded.  General appearance: patient in bed with eyes closed, in no acute distress Resp: patient mechanically ventillated Cardio: regular rate and rhythm, S1, S2 normal, no murmur, click, rub or gallop GI: OG tube in place, bowel sounds present, distended, non-tender, tympanic to percussion Skin: warm and dry Neurologic: patient opens eyes to voice and responds to commands to squeeze fingers and move toes  Lab Results:  Recent Labs    11/29/20 2243 11/30/20 0225  WBC 15.8* 12.9*  HGB 14.7 13.6  HCT 44.5 41.8  PLT 415* 377   BMET Recent Labs    11/29/20 2243 11/30/20 0224  NA 132* 130*  K 3.9 4.5  CL 99 100  CO2 21* 21*  GLUCOSE 186* 242*  BUN 20 22  CREATININE 1.19 1.30*  CALCIUM 7.7* 7.3*   PT/INR No results for input(s): LABPROT, INR in the last 72 hours.  Studies/Results: DG Chest  1 View  Result Date: 11/29/2020 CLINICAL DATA:  Status post intubation. EXAM: CHEST  1 VIEW COMPARISON:  Chest radiograph dated 11/29/2020. FINDINGS: Endotracheal tube approximately 7 cm above the carina. Enteric tube extends below the diaphragm with tip in the left upper abdomen likely in the gastric fundus. Bibasilar streaky atelectasis. Developing infiltrate is less likely. No focal consolidation, pleural effusion or pneumothorax. Stable cardiac silhouette. Probable hiatal hernia. No acute osseous pathology. Cervical fusion hardware. IMPRESSION: Endotracheal tube above the carina. Electronically Signed   By: Anner Crete M.D.   On: 11/29/2020 23:10   CT HEAD WO CONTRAST (5MM)  Result Date: 11/29/2020 CLINICAL DATA:  Encephalopathy EXAM: CT HEAD WITHOUT CONTRAST TECHNIQUE: Contiguous axial images were obtained from the base of the skull through the vertex without intravenous contrast. COMPARISON:  08/22/2017 FINDINGS: Brain: There is no mass, hemorrhage or extra-axial collection. There is generalized atrophy without lobar predilection. Hypodensity of the white matter is most commonly associated with chronic microvascular disease. 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 Vascular: No abnormal hyperdensity of the major intracranial arteries or dural venous sinuses. No intracranial atherosclerosis. Skull: The visualized skull base, calvarium and extracranial soft tissues are normal. Sinuses/Orbits: No fluid levels or advanced mucosal thickening of the visualized paranasal sinuses. No mastoid or middle ear effusion. The orbits are normal. IMPRESSION: 1. No acute intracranial abnormality. 2. Generalized atrophy and chronic microvascular ischemia. Electronically Signed   By: Ulyses Jarred M.D.   On: 11/29/2020 19:31   CT ABDOMEN PELVIS W CONTRAST  Result Date: 11/29/2020 CLINICAL DATA:  Bowel obstruction suspected. C.o decreased appetite, nausea, poor p.o. intake. He states that he  was constipated for the last few days but last night began experiencing intermittent diarrhea. HX CKD, HTN, GI bleed, DM EXAM: CT ABDOMEN AND PELVIS WITH CONTRAST TECHNIQUE: Multidetector CT imaging of the abdomen and pelvis was performed using the standard protocol following bolus administration of intravenous contrast. CONTRAST:  63m OMNIPAQUE IOHEXOL 350 MG/ML SOLN COMPARISON:  None. FINDINGS: Lines and tubes: Enteric tube noted coursing below the hemidiaphragm with tip within the gastric lumen and side port at the gastroesophageal junction. Foley catheter tip and balloon terminate within the urinary bladder. Lower chest: Trace bilateral pleural effusions, left greater than right. Hepatobiliary: The hepatic parenchyma is diffusely hypodense compared to the splenic parenchyma consistent with fatty infiltration. No focal liver abnormality. No gallstones, gallbladder wall thickening, or pericholecystic fluid. No biliary dilatation. Pancreas: Diffusely atrophic. No focal lesion. Otherwise normal pancreatic contour. No surrounding inflammatory changes. No main pancreatic ductal dilatation. Spleen: Normal in size without focal abnormality. Adrenals/Urinary Tract: No adrenal nodule bilaterally. Bilateral kidneys enhance symmetrically. Bilateral renal cortical scarring. Pericentimeter fluid density lesion within the right kidney likely represents a simple renal cyst. Subcentimeter hypodensity within left kidney too small to characterize. No hydronephrosis. No hydroureter. The urinary bladder is decompressed. Stomach/Bowel: Stomach is within normal limits. Diffuse fluid dilatation of the proximal and mid small bowel with a likely transition point within the right mid abdomen (5:42). The distal small bowel is decompressed. The large bowel is decompressed. No evidence of large bowel wall thickening or dilatation. Diffuse left colon and sigmoid diverticulosis. Appendix appears normal. Vascular/Lymphatic: No abdominal aorta  or iliac aneurysm. Severe atherosclerotic plaque of the aorta and its branches. No abdominal, pelvic, or inguinal lymphadenopathy. Reproductive: Prostate is unremarkable. Other: Trace free fluid within the pelvis. No intraperitoneal free gas. No organized fluid collection. Musculoskeletal: Bilateral trace volume inguinal hernias. No suspicious lytic or blastic osseous lesions. No acute displaced fracture. Limited evaluation of the visualized ribs due to motion artifact. Multilevel degenerative changes of the spine. IMPRESSION: 1. Small-bowel obstruction with a likely transition point within the right mid abdomen. 2. Enteric tube with tip within the gastric lumen side port but side port at the gastroesophageal junction. Consider advancing by 3 cm. 3. Colonic diverticulosis with no acute diverticulitis. 4. Bilateral trace pleural effusions. 5.  Aortic Atherosclerosis (ICD10-I70.0). Electronically Signed   By: MIven FinnM.D.   On: 11/29/2020 20:20   DG CHEST PORT 1 VIEW  Result Date: 11/30/2020 CLINICAL DATA:  Status post PICC line placement EXAM: PORTABLE CHEST 1 VIEW COMPARISON:  11/29/2020 FINDINGS: Left IJ venous catheter terminates in the upper SVC. Endotracheal tube terminates 4 cm above the carina. Enteric tube terminates in the gastric cardia. No frank interstitial edema. Small bilateral pleural effusions, right greater than left. The heart is normal in size. Defibrillator pad overlying the left hemithorax. Cervical spine fixation hardware, incompletely visualized. IMPRESSION: Left IJ venous catheter terminates in the upper SVC. Otherwise unchanged. Electronically Signed   By: SJulian HyM.D.   On: 11/30/2020 02:14   DG CHEST PORT 1 VIEW  Result Date: 11/29/2020 CLINICAL DATA:  Altered mental status. NG tube placement verification. EXAM: PORTABLE CHEST 1 VIEW COMPARISON:  Chest x-ray dated 11/28/2020. FINDINGS: Enteric tube appears adequately positioned in the stomach. Heart size and  mediastinal contours are stable. Lungs are clear. No pleural effusion is seen. IMPRESSION: Enteric tube appears adequately positioned in the stomach.  No active disease. Electronically Signed   By: Franki Cabot M.D.   On: 11/29/2020 09:51   DG CHEST PORT 1 VIEW  Result Date: 11/28/2020 CLINICAL DATA:  75 year old male status post nasogastric tube placement EXAM: PORTABLE CHEST 1 VIEW COMPARISON:  Prior radiograph obtained earlier today FINDINGS: Interval placement of a nasogastric tube. The tube appears well positioned overlying the gastric fundus. Inspiratory volumes are low with bibasilar atelectasis. No evidence of pneumothorax or large effusion. Incompletely imaged dilated small bowel in the upper abdomen suggests underlying bowel obstruction. IMPRESSION: Well-positioned nasogastric tube. Electronically Signed   By: Jacqulynn Cadet M.D.   On: 11/28/2020 10:15   ECHOCARDIOGRAM COMPLETE  Result Date: 11/28/2020    ECHOCARDIOGRAM REPORT   Patient Name:   DELWIN HUNDT Date of Exam: 11/28/2020 Medical Rec #:  [id number]       Height:       71.0 in Accession #:    [id number]      Weight:       195.1 lb Date of Birth:  1946/02/08       BSA:          2.086 m Patient Age:    35 years        BP:           167/82 mmHg Patient Gender: M               HR:           65 bpm. Exam Location:  [facility] Procedure: 2D Echo, Cardiac Doppler and Color Doppler Indications:    R94.31 Abnormal EKG  History:        Patient has prior history of Echocardiogram examinations, most                 recent 08/23/2017. Risk Factors:Diabetes and Hypertension.  Sonographer:    Bernadene Person RDCS Referring Phys: [place]  1. Left ventricular ejection fraction, by estimation, is 55 to 60%. The left ventricle has normal function. The left ventricle has no regional wall motion abnormalities. There is moderate concentric left ventricular hypertrophy. Left ventricular diastolic parameters are consistent with Grade  I diastolic dysfunction (impaired relaxation).  2. Right ventricular systolic function is normal. The right ventricular size is normal. Tricuspid regurgitation signal is inadequate for assessing PA pressure.  3. The mitral valve is normal in structure. Trivial mitral valve regurgitation. No evidence of mitral stenosis.  4. The aortic valve is tricuspid. Aortic valve regurgitation is mild.  5. Aortic dilatation noted. There is mild dilatation of the aortic root, measuring 43 mm. Comparison(s): A prior study was performed on 08/23/2017. Slight increase in aortic root. FINDINGS  Left Ventricle: Left ventricular ejection fraction, by estimation, is 55 to 60%. The left ventricle has normal function. The left ventricle has no regional wall motion abnormalities. The left ventricular internal cavity size was normal in size. There is  moderate concentric left ventricular hypertrophy. Left ventricular diastolic parameters are consistent with Grade I diastolic dysfunction (impaired relaxation). Right Ventricle: The right ventricular size is normal. No increase in right ventricular wall thickness. Right ventricular systolic function is normal. Tricuspid regurgitation signal is inadequate for assessing PA pressure. Left Atrium: Left atrial size was normal in size. Right Atrium: Right atrial size was normal in size. Pericardium: There is no evidence of pericardial effusion. Mitral Valve: The mitral valve is normal in structure. Trivial mitral valve regurgitation. No evidence of mitral valve stenosis. Tricuspid Valve: The tricuspid valve  is normal in structure. Tricuspid valve regurgitation is not demonstrated. Aortic Valve: The aortic valve is tricuspid. Aortic valve regurgitation is mild. Aortic regurgitation PHT measures 1048 msec. Pulmonic Valve: The pulmonic valve was not well visualized. Pulmonic valve regurgitation is mild. No evidence of pulmonic stenosis. Aorta: Aortic dilatation noted. There is mild dilatation of the  aortic root, measuring 43 mm. IAS/Shunts: The atrial septum is grossly normal.  LEFT VENTRICLE PLAX 2D LVIDd:         4.97 cm  Diastology LVIDs:         3.34 cm  LV e' medial:    5.40 cm/s LV PW:         1.38 cm  LV E/e' medial:  5.9 LV IVS:        1.39 cm  LV e' lateral:   6.23 cm/s LVOT diam:     2.20 cm  LV E/e' lateral: 5.1 LV SV:         100 LV SV Index:   48 LVOT Area:     3.80 cm  RIGHT VENTRICLE RV S prime:     14.30 cm/s TAPSE (M-mode): 1.9 cm LEFT ATRIUM             Index       RIGHT ATRIUM          Index LA diam:        2.50 cm 1.20 cm/m  RA Area:     8.74 cm LA Vol (A2C):   33.2 ml 15.91 ml/m RA Volume:   11.90 ml 5.70 ml/m LA Vol (A4C):   32.7 ml 15.67 ml/m LA Biplane Vol: 33.5 ml 16.06 ml/m  AORTIC VALVE LVOT Vmax:   108.00 cm/s LVOT Vmean:  68.500 cm/s LVOT VTI:    0.262 m AI PHT:      1048 msec  AORTA Ao Root diam: 4.30 cm Ao Asc diam:  3.80 cm MITRAL VALVE MV Area (PHT): 1.87 cm    SHUNTS MV Decel Time: 405 msec    Systemic VTI:  0.26 m MV E velocity: 31.70 cm/s  Systemic Diam: 2.20 cm MV A velocity: 72.00 cm/s MV E/A ratio:  0.44 Rudean Haskell MD Electronically signed by Rudean Haskell MD Signature Date/Time: 11/28/2020/3:42:26 PM    Final     Anti-infectives: Anti-infectives (From admission, onward)    Start     Dose/Rate Route Frequency Ordered Stop   11/30/20 1000  vancomycin (VANCOREADY) IVPB 750 mg/150 mL        750 mg 150 mL/hr over 60 Minutes Intravenous Every 12 hours 11/30/20 0043     11/30/20 0130  vancomycin (VANCOREADY) IVPB 1500 mg/300 mL        1,500 mg 150 mL/hr over 120 Minutes Intravenous  Once 11/30/20 0043 11/30/20 0511   11/30/20 0130  ceFEPIme (MAXIPIME) 2 g in sodium chloride 0.9 % 100 mL IVPB        2 g 200 mL/hr over 30 Minutes Intravenous Every 12 hours 11/30/20 0043         Assessment/Plan: 75 y.o. male with SBO complicated by multiple comorbidities including: Patient Active Problem List   Diagnosis Date Noted   SBO (small bowel  obstruction) ([place]) 11/28/2020   Acute CVA (cerebrovascular accident) ([place]) 08/24/2017   Altered mental status 08/23/2017   Sleep apnea 08/23/2017   Elevated troponin 08/23/2017   Abnormal CT of the head 08/23/2017   Hypertension 08/23/2017   Type 2 diabetes mellitus ([place]) 08/23/2017   Syncope 08/23/2017  S/P carpal tunnel release right 07/13/17 07/27/2017   AKI (acute kidney injury) ([place]) 11/29/2015   Lower GI bleed 11/28/2015   Rectal bleed 11/28/2015   Rectal bleeding 11/28/2015   Benign prostatic hypertrophy with urinary retention 01/20/2015   Urinary retention 05/21/2014   ARF (acute renal failure) ([place]) 03/26/2014   IDDM (insulin dependent diabetes mellitus) ([place]) 03/26/2014   UTI (lower urinary tract infection) 03/24/2014   Hyponatremia 03/24/2014   Acute encephalopathy 03/24/2014   -CT reveals small bowel obstruction with fluid dilatation of the proximal and mid small bowel, distal small bowel and large bowel are decompressed, no evidence of large bowel wall thickening or dilatation -OG tube in place with continued output, will continue to monitor, will also track bowel movements and abdominal distention -More aggressive intervention not indicated at this time -Surgery will continue to follow   LOS: 2 days    Redmond Baseman [facility] 11/30/2020

## 2020-11-30 NOTE — Progress Notes (Signed)
Patient Demographics:    Albert Garrison, is a 75 y.o. male, DOB - [birth year], [id number]  Admit date - 11/27/2020   Admitting Physician Kynley Metzger Denton Brick, MD  Outpatient Primary MD for the patient is Celene Squibb, MD  LOS - 2   No chief complaint on file.       Subjective:    Albert Garrison today has no fevers,   11/30/20-update -Overnight apparently patient became hypotensive and bradycardic -Apparently had SVTs and possible nonsustained V. Tach (arrhythmias) -Received atropine -Went into asystole at 10:37 PM- -Successful resuscitative efforts and subsequent intubation -Patient required IV Levophed and vasopressin for pressure support post intubation--- as of 6 PM on 11/30/2020 patient has been weaned off vasopressin remains on Levophed for pressure support -Was found to have elevated troponins--without frank ACS type findings -SBO persistently-OG tube -Attempted transfer out of PCCM at [facility] given complexity of his findings  Assessment  & Plan :    Principal Problem:   SBO (small bowel obstruction) ([place]) Active Problems:   Hyponatremia   Cardiac arrest ([place])   Other specified cardiac arrhythmias--- sleep apnea- arrhythmias when he desaturates    Endotracheally intubated   Shock circulatory ([place])   Acute encephalopathy   Type 2 diabetes mellitus ([place])   Acute respiratory failure with hypoxia and hypercarbia (HCC)  Brief Summary:- 75 y.o. male  of  BPH, diabetes mellitus, GERD presents to the ED with generalized weakness, nausea without vomiting and diarrhea of 24-hour duration was admitted on 11/27/2020 with symptomatic hyponatremia with a sodium of 118, post admission developed clinical and radiological signs consistent with SBO, NG was placed on 11/28/2020  A/p  1) status postcardiac arrest--- currently intubated (intubated 11/29/20) and sedated -Continue IV fentanyl for sedation -Vent  settings :PRVC/40%/PEEP 5/RR-18/TV 600 -Pulmonary critical care consult appreciated  2) cardiac arrhythmias-- Apparently had NSVTs and possible nonsustained V. Tach (arrhythmias) -EF from echo of 11/28/2020 was 55 to 60%, repeat echo from 11/30/2020 with EF of 65 to 70% no regional wall motion normalities cardiology consult appreciated -Continue IV amiodarone and IV heparin  3) hypotension/shock---  cardiac Vs  sepsis -Patient has been weaned off vasopressin -Currently on Levophed for pressure support -Okay to continue antibiotics pending culture data -Continue IV fluids  4)Symptomatic Hyponatremia--- suspect due to diarrhea/GI losses sodium is 118>>120>>121>>123>>126>>130 -Continue IV fluids -Monitor BMP and adjust IV fluid rate and consistency appropriately   5)SBO--- clinical and radiological findings of SBO -N.p.o. -Intubated 11/30/2019 and OG tube placed to replace prior NG tube -Discussed with general surgeon -Dr Arnoldo Morale Notified Gen surgeon Dr Georgette Dover at [facility] patient will be coming to [place] surgery recommends conservative management at this time, may need operative intervention if fails conservative measures and if more hemodynamically stable  6)DM2 -A1C is 8.6--- reflecting uncontrolled DM with hyperglycemia PTA Hold Metformin as he has SBO  Use Novolog/Humalog Sliding scale insulin with Accu-Cheks/Fingersticks as ordered    7)Social/Ethics--plan of care discussed with patient and patient's Maternal aunt Ms. Abundio Miu and his brother [name]) Mr Quaran Quertermous and as well his son Prabhnoor Glenney --patient is a full code   8)History of recurrent AKI's----no definite CKD - renally adjust medications, avoid nephrotoxic agents / dehydration  / hypotension  9)H/o HTN-currently hypotensive requiring pressor PTA was on amlodipine and  Avapro -both on hold due to hypotension  10) BPH with LUTs and urinary retention--- post void bladder scan with over 750 mL of  urine on 11/28/20 -Indwelling Foley catheter placed on 11/28/2020 -We will start p.o. Flomax when oral intake resumes  11)OSA--patient has obstructive sleep apnea with significant apneic episodes when he sleeps--  arrhythmias when he desaturates  -Would benefit from sleep study post discharge  -Prophylaxis-IV heparin for DVT prophylaxis on IV Protonix for GI prophylaxis  Procedures:- ET tube 11/29/2020 -Cardiac resuscitation 11/29/2020 -Left IJ central line 11/29/2020 -OG tube -Foley 11/28/2020  CRITICAL CARE Performed by: Roxan Hockey   Total critical care time: 47 minutes  Critical care time was exclusive of separately billable procedures and treating other patients. - -Vent settings :PRVC/40%/PEEP 5/RR-18/TV 600 -Continue IV Levophed for pressure support, IV heparin and IV amiodarone -Patient has been weaned off IV vasopressin Critical care was necessary to treat or prevent imminent or life-threatening deterioration.  Critical care was time spent personally by me on the following activities: development of treatment plan with patient and/or surrogate as well as nursing, discussions with consultants, evaluation of patient's response to treatment, examination of patient, obtaining history from patient or surrogate, ordering and performing treatments and interventions, ordering and review of laboratory studies, ordering and review of radiographic studies, pulse oximetry and re-evaluation of patient's condition.    Disposition/--transfer to the PCCM service at [facility] for higher level of care-patient will need ongoing general surgery and cardiology involvement   Dispo: The patient is from: Home              Anticipated d/c is to: Most likely SNF              Anticipated d/c date is: >3 days              Patient currently is not medically stable to d/c. Barriers: Not Clinically Stable-   Code Status : -  Code Status: Full Code   Family Communication:   (patient is alert, awake  and coherent)  Discussed with His Aunt Ms Abundio Miu-- at [phone] -Discussed with patient's Maternal aunt Ms. Abundio Miu and his brother [name]) Mr Khaalis Ismaili and as well his son Tykim Alarcon Consults  :  Gen Surgery/PCCM and cardiology  DVT Prophylaxis  :   - SCDs  SCDs Start: 11/27/20 1854 Place TED hose Start: 11/27/20 1854 -IV heparin   Lab Results  Component Value Date   PLT 377 11/30/2020    Inpatient Medications  Scheduled Meds:  bisacodyl  10 mg Rectal Once   bisacodyl  10 mg Rectal QHS   Chlorhexidine Gluconate Cloth  6 each Topical Daily   insulin aspart  0-9 Units Subcutaneous Q4H   sodium chloride flush  3 mL Intravenous Q12H   sodium chloride flush  3 mL Intravenous Q12H   Continuous Infusions:  sodium chloride 100 mL/hr at 11/30/20 1813   sodium chloride     sodium chloride Stopped (11/30/20 0009)   amiodarone 30 mg/hr (11/30/20 1813)   ceFEPime (MAXIPIME) IV Stopped (11/30/20 0941)   dexmedetomidine (PRECEDEX) IV infusion Stopped (11/29/20 2227)   fentaNYL infusion INTRAVENOUS 150 mcg/hr (11/30/20 1813)   heparin 1,250 Units/hr (11/30/20 1137)   norepinephrine (LEVOPHED) Adult infusion 8 mcg/min (11/30/20 1813)   vancomycin Stopped (11/30/20 1207)   vasopressin Stopped (11/30/20 0937)   PRN Meds:.sodium chloride, acetaminophen **OR** acetaminophen, bisacodyl, labetalol, LORazepam, ondansetron **OR** ondansetron (  ZOFRAN) IV, sodium chloride flush    Anti-infectives (From admission, onward)    Start     Dose/Rate Route Frequency Ordered Stop   11/30/20 1000  vancomycin (VANCOREADY) IVPB 750 mg/150 mL        750 mg 150 mL/hr over 60 Minutes Intravenous Every 12 hours 11/30/20 0043     11/30/20 0130  vancomycin (VANCOREADY) IVPB 1500 mg/300 mL        1,500 mg 150 mL/hr over 120 Minutes Intravenous  Once 11/30/20 0043 11/30/20 0511   11/30/20 0130  ceFEPIme (MAXIPIME) 2 g in sodium chloride 0.9 % 100 mL IVPB        2 g 200 mL/hr over 30 Minutes  Intravenous Every 12 hours 11/30/20 0043           Objective:   Vitals:   11/30/20 1554 11/30/20 1558 11/30/20 1600 11/30/20 1700  BP:   (!) 117/58 134/61  Pulse: 64  71 67  Resp: 18  18 (!) 21  Temp:  99.4 F (37.4 C)    TempSrc:  Axillary    SpO2: 100%  100% 100%  Weight:      Height:        Wt Readings from Last 3 Encounters:  11/30/20 89.9 kg  06/24/20 86.2 kg  11/08/18 77.1 kg    Intake/Output Summary (Last 24 hours) at 11/30/2020 1844 Last data filed at 11/30/2020 1813 Gross per 24 hour  Intake 5188.24 ml  Output 2851 ml  Net 2337.24 ml   Physical Exam  Gen:-Intubated and sedated  HEENT-ET tube and OG tube  Neck-left-sided IJ central line Lungs-  CTAB , fair symmetrical air movement CV- S1, S2 normal, regular  Abd-diminished bowel sounds, abdomen is less distended Extremity/Skin:- No  edema, pedal pulses present  NeuroPsych-intubated and sedated  GU-Foley in situ   Data Review:   Micro Results Recent Results (from the past 240 hour(s))  Resp Panel by RT-PCR (Flu A&B, Covid) Nasopharyngeal Swab     Status: None   Collection Time: 11/27/20  9:16 AM   Specimen: Nasopharyngeal Swab; Nasopharyngeal(NP) swabs in vial transport medium  Result Value Ref Range Status   SARS Coronavirus 2 by RT PCR NEGATIVE NEGATIVE Final    Comment: (NOTE) SARS-CoV-2 target nucleic acids are NOT DETECTED.  The SARS-CoV-2 RNA is generally detectable in upper respiratory specimens during the acute phase of infection. The lowest concentration of SARS-CoV-2 viral copies this assay can detect is 138 copies/mL. A negative result does not preclude SARS-Cov-2 infection and should not be used as the sole basis for treatment or other patient management decisions. A negative result may occur with  improper specimen collection/handling, submission of specimen other than nasopharyngeal swab, presence of viral mutation(s) within the areas targeted by this assay, and inadequate number of  viral copies(<138 copies/mL). A negative result must be combined with clinical observations, patient history, and epidemiological information. The expected result is Negative.  Fact Sheet for Patients:  EntrepreneurPulse.com.au  Fact Sheet for Healthcare Providers:  IncredibleEmployment.be  This test is no t yet approved or cleared by the [place] FDA and  has been authorized for detection and/or diagnosis of SARS-CoV-2 by FDA under an Emergency Use Authorization (EUA). This EUA will remain  in effect (meaning this test can be used) for the duration of the COVID-19 declaration under Section 564(b)(1) of the Act, 21 U.S.C.section 360bbb-3(b)(1), unless the authorization is terminated  or revoked sooner.       Influenza A by PCR  NEGATIVE NEGATIVE Final   Influenza B by PCR NEGATIVE NEGATIVE Final    Comment: (NOTE) The Xpert Xpress SARS-CoV-2/FLU/RSV plus assay is intended as an aid in the diagnosis of influenza from Nasopharyngeal swab specimens and should not be used as a sole basis for treatment. Nasal washings and aspirates are unacceptable for Xpert Xpress SARS-CoV-2/FLU/RSV testing.  Fact Sheet for Patients: EntrepreneurPulse.com.au  Fact Sheet for Healthcare Providers: IncredibleEmployment.be  This test is not yet approved or cleared by the [place] FDA and has been authorized for detection and/or diagnosis of SARS-CoV-2 by FDA under an Emergency Use Authorization (EUA). This EUA will remain in effect (meaning this test can be used) for the duration of the COVID-19 declaration under Section 564(b)(1) of the Act, 21 U.S.C. section 360bbb-3(b)(1), unless the authorization is terminated or revoked.  Performed at [facility], [address], [place], [place] [address]   MRSA Next Gen by PCR, Nasal     Status: None   Collection Time: 11/27/20 12:15 PM   Specimen: Nasal Mucosa; Nasal Swab   Result Value Ref Range Status   MRSA by PCR Next Gen NOT DETECTED NOT DETECTED Final    Comment: (NOTE) The GeneXpert MRSA Assay (FDA approved for NASAL specimens only), is one component of a comprehensive MRSA colonization surveillance program. It is not intended to diagnose MRSA infection nor to guide or monitor treatment for MRSA infections. Test performance is not FDA approved in patients less than 21 years old. Performed at [facility], [address], [place], [place] [address]   Culture, blood (Routine X 2) w Reflex to ID Panel     Status: None (Preliminary result)   Collection Time: 11/30/20 12:35 PM   Specimen: Right Antecubital; Blood  Result Value Ref Range Status   Specimen Description   Final    RIGHT ANTECUBITAL BOTTLES DRAWN AEROBIC AND ANAEROBIC   Special Requests   Final    Blood Culture adequate volume Performed at [facility], [address], [place], [place] [address]    Culture PENDING  Incomplete   Report Status PENDING  Incomplete  Culture, blood (Routine X 2) w Reflex to ID Panel     Status: None (Preliminary result)   Collection Time: 11/30/20 12:35 PM   Specimen: BLOOD LEFT HAND  Result Value Ref Range Status   Specimen Description   Final    BLOOD LEFT HAND BOTTLES DRAWN AEROBIC AND ANAEROBIC   Special Requests   Final    Blood Culture adequate volume Performed at [facility], [address] [place], [place] [address]    Culture PENDING  Incomplete   Report Status PENDING  Incomplete    Radiology Reports DG Chest 1 View  Result Date: 11/29/2020 CLINICAL DATA:  Status post intubation. EXAM: CHEST  1 VIEW COMPARISON:  Chest radiograph dated 11/29/2020. FINDINGS: Endotracheal tube approximately 7 cm above the carina. Enteric tube extends below the diaphragm with tip in the left upper abdomen likely in the gastric fundus. Bibasilar streaky atelectasis. Developing infiltrate is less likely. No focal consolidation, pleural effusion or pneumothorax.  Stable cardiac silhouette. Probable hiatal hernia. No acute osseous pathology. Cervical fusion hardware. IMPRESSION: Endotracheal tube above the carina. Electronically Signed   By: Anner Crete M.D.   On: 11/29/2020 23:10   CT HEAD WO CONTRAST (5MM)  Result Date: 11/29/2020 CLINICAL DATA:  Encephalopathy EXAM: CT HEAD WITHOUT CONTRAST TECHNIQUE: Contiguous axial images were obtained from the base of the skull through the vertex without intravenous contrast. COMPARISON:  08/22/2017 FINDINGS: Brain: There is no mass, hemorrhage or extra-axial collection. There is generalized atrophy without lobar predilection. Hypodensity of the white matter is most commonly associated with chronic microvascular disease. 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 Vascular: No abnormal hyperdensity of the major intracranial arteries or dural venous sinuses. No intracranial atherosclerosis. Skull: The visualized skull base, calvarium and extracranial soft tissues are normal. Sinuses/Orbits: No fluid levels or advanced mucosal thickening of the visualized paranasal sinuses. No mastoid or middle ear effusion. The orbits are normal. IMPRESSION: 1. No acute intracranial abnormality. 2. Generalized atrophy and chronic microvascular ischemia. Electronically Signed   By: Ulyses Jarred M.D.   On: 11/29/2020 19:31   CT ABDOMEN PELVIS W CONTRAST  Result Date: 11/29/2020 CLINICAL DATA:  Bowel obstruction suspected. C.o decreased appetite, nausea, poor p.o. intake. He states that he was constipated for the last few days but last night began experiencing intermittent diarrhea. HX CKD, HTN, GI bleed, DM EXAM: CT ABDOMEN AND PELVIS WITH CONTRAST TECHNIQUE: Multidetector CT imaging of the abdomen and pelvis was performed using the standard protocol following bolus administration of intravenous contrast. CONTRAST:  49m OMNIPAQUE IOHEXOL 350 MG/ML SOLN COMPARISON:  None. FINDINGS: Lines and tubes: Enteric tube noted  coursing below the hemidiaphragm with tip within the gastric lumen and side port at the gastroesophageal junction. Foley catheter tip and balloon terminate within the urinary bladder. Lower chest: Trace bilateral pleural effusions, left greater than right. Hepatobiliary: The hepatic parenchyma is diffusely hypodense compared to the splenic parenchyma consistent with fatty infiltration. No focal liver abnormality. No gallstones, gallbladder wall thickening, or pericholecystic fluid. No biliary dilatation. Pancreas: Diffusely atrophic. No focal lesion. Otherwise normal pancreatic contour. No surrounding inflammatory changes. No main pancreatic ductal dilatation. Spleen: Normal in size without focal abnormality. Adrenals/Urinary Tract: No adrenal nodule bilaterally. Bilateral kidneys enhance symmetrically. Bilateral renal cortical scarring. Pericentimeter fluid density lesion within the right kidney likely represents a simple renal cyst. Subcentimeter hypodensity within left kidney too small to characterize. No hydronephrosis. No hydroureter. The urinary bladder is decompressed. Stomach/Bowel: Stomach is within normal limits. Diffuse fluid dilatation of the proximal and mid small bowel with a likely transition point within the right mid abdomen (5:42). The distal small bowel is decompressed. The large bowel is decompressed. No evidence of large bowel wall thickening or dilatation. Diffuse left colon and sigmoid diverticulosis. Appendix appears normal. Vascular/Lymphatic: No abdominal aorta or iliac aneurysm. Severe atherosclerotic plaque of the aorta and its branches. No abdominal, pelvic, or inguinal lymphadenopathy. Reproductive: Prostate is unremarkable. Other: Trace free fluid within the pelvis. No intraperitoneal free gas. No organized fluid collection. Musculoskeletal: Bilateral trace volume inguinal hernias. No suspicious lytic or blastic osseous lesions. No acute displaced fracture. Limited evaluation of the  visualized ribs due to motion artifact. Multilevel degenerative changes of the spine. IMPRESSION: 1. Small-bowel obstruction with a likely transition point within the right mid abdomen. 2. Enteric tube with tip within the gastric lumen side port but side port at the gastroesophageal junction. Consider advancing by 3 cm. 3. Colonic diverticulosis with no acute diverticulitis. 4. Bilateral trace pleural effusions. 5.  Aortic Atherosclerosis (ICD10-I70.0). Electronically Signed   By: MIven FinnM.D.   On: 11/29/2020 20:20   DG CHEST PORT 1 VIEW  Result Date: 11/30/2020 CLINICAL DATA:  Status post PICC line placement EXAM: PORTABLE CHEST 1 VIEW COMPARISON:  11/29/2020 FINDINGS: Left IJ venous catheter terminates in the upper SVC. Endotracheal tube terminates 4 cm above the carina.  Enteric tube terminates in the gastric cardia. No frank interstitial edema. Small bilateral pleural effusions, right greater than left. The heart is normal in size. Defibrillator pad overlying the left hemithorax. Cervical spine fixation hardware, incompletely visualized. IMPRESSION: Left IJ venous catheter terminates in the upper SVC. Otherwise unchanged. Electronically Signed   By: Julian Hy M.D.   On: 11/30/2020 02:14   DG CHEST PORT 1 VIEW  Result Date: 11/29/2020 CLINICAL DATA:  Altered mental status. NG tube placement verification. EXAM: PORTABLE CHEST 1 VIEW COMPARISON:  Chest x-ray dated 11/28/2020. FINDINGS: Enteric tube appears adequately positioned in the stomach. Heart size and mediastinal contours are stable. Lungs are clear. No pleural effusion is seen. IMPRESSION: Enteric tube appears adequately positioned in the stomach. No active disease. Electronically Signed   By: Franki Cabot M.D.   On: 11/29/2020 09:51   DG CHEST PORT 1 VIEW  Result Date: 11/28/2020 CLINICAL DATA:  75 year old male status post nasogastric tube placement EXAM: PORTABLE CHEST 1 VIEW COMPARISON:  Prior radiograph obtained earlier  today FINDINGS: Interval placement of a nasogastric tube. The tube appears well positioned overlying the gastric fundus. Inspiratory volumes are low with bibasilar atelectasis. No evidence of pneumothorax or large effusion. Incompletely imaged dilated small bowel in the upper abdomen suggests underlying bowel obstruction. IMPRESSION: Well-positioned nasogastric tube. Electronically Signed   By: Jacqulynn Cadet M.D.   On: 11/28/2020 10:15   DG ABD ACUTE 2+V W 1V CHEST  Result Date: 11/28/2020 CLINICAL DATA:  Nausea, vomiting, abdominal pain EXAM: DG ABDOMEN ACUTE WITH 1 VIEW CHEST COMPARISON:  08/28/2018 FINDINGS: Relatively low lung volumes with crowding of infrahilar bronchovascular structures Heart size upper limits normal for technique. No definite effusion. No pneumothorax. No free air. Multiple gas dilated small bowel loops in the abdomen. The colon is decompressed. Aortic Atherosclerosis (ICD10-170.0). Mild spondylitic changes in the lumbar spine. IMPRESSION: 1. Dilated small bowel loops suggesting distal small bowel obstruction. Electronically Signed   By: Lucrezia Europe M.D.   On: 11/28/2020 08:51   ECHOCARDIOGRAM COMPLETE  Result Date: 11/28/2020    ECHOCARDIOGRAM REPORT   Patient Name:   KAID SPOHR Date of Exam: 11/28/2020 Medical Rec #:  [id number]       Height:       71.0 in Accession #:    [id number]      Weight:       195.1 lb Date of Birth:  February 19, 1946       BSA:          2.086 m Patient Age:    1 years        BP:           167/82 mmHg Patient Gender: M               HR:           65 bpm. Exam Location:  [facility] Procedure: 2D Echo, Cardiac Doppler and Color Doppler Indications:    R94.31 Abnormal EKG  History:        Patient has prior history of Echocardiogram examinations, most                 recent 08/23/2017. Risk Factors:Diabetes and Hypertension.  Sonographer:    Bernadene Person RDCS Referring Phys: [place]  1. Left ventricular ejection fraction, by  estimation, is 55 to 60%. The left ventricle has normal function. The left ventricle has no regional wall motion abnormalities. There is moderate concentric left ventricular hypertrophy. Left  ventricular diastolic parameters are consistent with Grade I diastolic dysfunction (impaired relaxation).  2. Right ventricular systolic function is normal. The right ventricular size is normal. Tricuspid regurgitation signal is inadequate for assessing PA pressure.  3. The mitral valve is normal in structure. Trivial mitral valve regurgitation. No evidence of mitral stenosis.  4. The aortic valve is tricuspid. Aortic valve regurgitation is mild.  5. Aortic dilatation noted. There is mild dilatation of the aortic root, measuring 43 mm. Comparison(s): A prior study was performed on 08/23/2017. Slight increase in aortic root. FINDINGS  Left Ventricle: Left ventricular ejection fraction, by estimation, is 55 to 60%. The left ventricle has normal function. The left ventricle has no regional wall motion abnormalities. The left ventricular internal cavity size was normal in size. There is  moderate concentric left ventricular hypertrophy. Left ventricular diastolic parameters are consistent with Grade I diastolic dysfunction (impaired relaxation). Right Ventricle: The right ventricular size is normal. No increase in right ventricular wall thickness. Right ventricular systolic function is normal. Tricuspid regurgitation signal is inadequate for assessing PA pressure. Left Atrium: Left atrial size was normal in size. Right Atrium: Right atrial size was normal in size. Pericardium: There is no evidence of pericardial effusion. Mitral Valve: The mitral valve is normal in structure. Trivial mitral valve regurgitation. No evidence of mitral valve stenosis. Tricuspid Valve: The tricuspid valve is normal in structure. Tricuspid valve regurgitation is not demonstrated. Aortic Valve: The aortic valve is tricuspid. Aortic valve regurgitation is  mild. Aortic regurgitation PHT measures 1048 msec. Pulmonic Valve: The pulmonic valve was not well visualized. Pulmonic valve regurgitation is mild. No evidence of pulmonic stenosis. Aorta: Aortic dilatation noted. There is mild dilatation of the aortic root, measuring 43 mm. IAS/Shunts: The atrial septum is grossly normal.  LEFT VENTRICLE PLAX 2D LVIDd:         4.97 cm  Diastology LVIDs:         3.34 cm  LV e' medial:    5.40 cm/s LV PW:         1.38 cm  LV E/e' medial:  5.9 LV IVS:        1.39 cm  LV e' lateral:   6.23 cm/s LVOT diam:     2.20 cm  LV E/e' lateral: 5.1 LV SV:         100 LV SV Index:   48 LVOT Area:     3.80 cm  RIGHT VENTRICLE RV S prime:     14.30 cm/s TAPSE (M-mode): 1.9 cm LEFT ATRIUM             Index       RIGHT ATRIUM          Index LA diam:        2.50 cm 1.20 cm/m  RA Area:     8.74 cm LA Vol (A2C):   33.2 ml 15.91 ml/m RA Volume:   11.90 ml 5.70 ml/m LA Vol (A4C):   32.7 ml 15.67 ml/m LA Biplane Vol: 33.5 ml 16.06 ml/m  AORTIC VALVE LVOT Vmax:   108.00 cm/s LVOT Vmean:  68.500 cm/s LVOT VTI:    0.262 m AI PHT:      1048 msec  AORTA Ao Root diam: 4.30 cm Ao Asc diam:  3.80 cm MITRAL VALVE MV Area (PHT): 1.87 cm    SHUNTS MV Decel Time: 405 msec    Systemic VTI:  0.26 m MV E velocity: 31.70 cm/s  Systemic Diam: 2.20 cm MV A  velocity: 72.00 cm/s MV E/A ratio:  0.44 Rudean Haskell MD Electronically signed by Rudean Haskell MD Signature Date/Time: 11/28/2020/3:42:26 PM    Final    ECHOCARDIOGRAM LIMITED  Result Date: 11/30/2020    ECHOCARDIOGRAM LIMITED REPORT   Patient Name:   GAMBLE KNICELEY Date of Exam: 11/30/2020 Medical Rec #:  [id number]       Height:       71.0 in Accession #:    [id number]      Weight:       198.2 lb Date of Birth:  1945-06-19       BSA:          2.100 m Patient Age:    75 years        BP:           106/54 mmHg Patient Gender: M               HR:           66 bpm. Exam Location:  [facility] Procedure: Limited Echo, Cardiac Doppler and Limited  Color Doppler Indications:    eval LVEF  History:        Patient has prior history of Echocardiogram examinations. Risk                 Factors:Diabetes and Hypertension. Ventricular Tachycardia                 I47.2.  Sonographer:    Alvino Chapel RCS Referring Phys: [id number] [facility]  Sonographer Comments: Patient on mechanical ventilator at time of echo. IMPRESSIONS  1. Left ventricular ejection fraction, by estimation, is 65 to 70%. The left ventricle has normal function. The left ventricle has no regional wall motion abnormalities. There is mild left ventricular hypertrophy. Left ventricular diastolic parameters are indeterminate.  2. Right ventricular systolic function is normal. The right ventricular size is normal. Tricuspid regurgitation signal is inadequate for assessing PA pressure.  3. The mitral valve is grossly normal.  4. Aortic valve regurgitation is mild.  5. Aortic dilatation noted. There is mild dilatation of the aortic root, measuring 42 mm. Comparison(s): Prior images reviewed side by side. Limited study. LVEF remains normal range. FINDINGS  Left Ventricle: Left ventricular ejection fraction, by estimation, is 65 to 70%. The left ventricle has normal function. The left ventricle has no regional wall motion abnormalities. The left ventricular internal cavity size was normal in size. There is  mild left ventricular hypertrophy. Left ventricular diastolic parameters are indeterminate. Right Ventricle: The right ventricular size is normal. No increase in right ventricular wall thickness. Right ventricular systolic function is normal. Tricuspid regurgitation signal is inadequate for assessing PA pressure. Pericardium: Presence of pericardial fat pad. Mitral Valve: The mitral valve is grossly normal. There is mild calcification of the mitral valve leaflet(s). Aortic Valve: Aortic valve regurgitation is mild. Aorta: Aortic dilatation noted. There is mild dilatation of the aortic root, measuring 42  mm. Venous: IVC assessment for right atrial pressure unable to be performed due to mechanical ventilation. IAS/Shunts: No atrial level shunt detected by color flow Doppler. LEFT VENTRICLE PLAX 2D LVIDd:         4.40 cm LVIDs:         2.70 cm LV PW:         1.30 cm LV IVS:        1.30 cm LVOT diam:     2.30 cm LVOT Area:     4.15 cm  LEFT ATRIUM         Index LA diam:    2.00 cm 0.95 cm/m   AORTA Ao Root diam: 4.20 cm MITRAL VALVE MV Area (PHT): 2.33 cm    SHUNTS MV Decel Time: 326 msec    Systemic Diam: 2.30 cm MV E velocity: 62.10 cm/s MV A velocity: 82.70 cm/s MV E/A ratio:  0.75 Rozann Lesches MD Electronically signed by Rozann Lesches MD Signature Date/Time: 11/30/2020/2:13:35 PM    Final      CBC Recent Labs  Lab 11/27/20 [id number] 11/28/20 0429 11/29/20 0023 11/29/20 2243 11/30/20 0225  WBC 14.3* 12.3* 11.9* 15.8* 12.9*  HGB 14.8 15.6 14.5 14.7 13.6  HCT 42.5 44.2 43.1 44.5 41.8  PLT 232 299 303 415* 377  MCV 89.3 88.0 90.9 93.7 93.7  MCH 31.1 31.1 30.6 30.9 30.5  MCHC 34.8 35.3 33.6 33.0 32.5  RDW 12.9 12.9 13.2 13.1 13.3  LYMPHSABS 1.3  --   --   --   --   MONOABS 1.1*  --   --   --   --   EOSABS 0.0  --   --   --   --   BASOSABS 0.0  --   --   --   --    Chemistries  Recent Labs  Lab 11/27/20 0943 11/27/20 1250 11/27/20 1918 11/28/20 0429 11/29/20 0023 11/29/20 2243 11/30/20 0224  NA 118*   < > 123* 126* 131*  130* 132* 130*  K 4.0  --  3.7 3.7 3.3*  3.3* 3.9 4.5  CL 88*  --  93* 93* 96*  96* 99 100  CO2 21*  --  23 21* 25  24 21* 21*  GLUCOSE 266*  --  232* 314* 172*  174* 186* 242*  BUN 20  --  '16 17 20  20 20 22  '$ CREATININE 1.27*  --  1.20 1.07 1.21  1.22 1.19 1.30*  CALCIUM 8.7*  --  8.0* 8.6* 8.4*  8.3* 7.7* 7.3*  MG  --   --  1.8  --  2.4 2.5*  --   AST 19  --   --   --   --   --  32  ALT 16  --   --   --   --   --  21  ALKPHOS 51  --   --   --   --   --  44  BILITOT 1.8*  --   --   --   --   --  1.4*   < > = values in this interval not displayed.    ------------------------------------------------------------------------------------------------------------------ No results for input(s): CHOL, HDL, LDLCALC, TRIG, CHOLHDL, LDLDIRECT in the last 72 hours.  Lab Results  Component Value Date   HGBA1C 8.6 (H) 11/27/2020   ------------------------------------------------------------------------------------------------------------------ No results for input(s): TSH, T4TOTAL, T3FREE, THYROIDAB in the last 72 hours.  Invalid input(s): FREET3 ------------------------------------------------------------------------------------------------------------------ No results for input(s): VITAMINB12, FOLATE, FERRITIN, TIBC, IRON, RETICCTPCT in the last 72 hours.  Coagulation profile No results for input(s): INR, PROTIME in the last 168 hours.  No results for input(s): DDIMER in the last 72 hours.  Cardiac Enzymes No results for input(s): CKMB, TROPONINI, MYOGLOBIN in the last 168 hours.  Invalid input(s): CK ------------------------------------------------------------------------------------------------------------------ No results found for: BNP  Roxan Hockey M.D on 11/30/2020 at 6:44 PM  Go to [email or web address] - for contact info  [facility] - Office  [phone]

## 2020-11-30 NOTE — Progress Notes (Signed)
Inpatient Diabetes Program Recommendations  AACE/ADA: New Consensus Statement on Inpatient Glycemic Control (2015)  Target Ranges:  Prepandial:   less than 140 mg/dL      Peak postprandial:   less than 180 mg/dL (1-2 hours)      Critically ill patients:  140 - 180 mg/dL   Lab Results  Component Value Date   GLUCAP 276 (H) 11/30/2020   HGBA1C 8.6 (H) 11/27/2020   Review of Glycemic Control Results for Albert Garrison, Albert Garrison (MRN [id number]) as of 11/30/2020 11:32  Ref. Range 11/29/2020 06:42 11/29/2020 11:12 11/29/2020 18:05 11/30/2020 02:14 11/30/2020 06:11 11/30/2020 11:18  Glucose-Capillary Latest Ref Range: 70 - 99 mg/dL 206 (H) 268 (H) 201 (H) 259 (H) 259 (H) 276 (H)   Diabetes history: DM 2 Outpatient Diabetes medications: Metformin 500 mg Daily Current orders for Inpatient glycemic control:  Novolog 0-6 units tid + hs  Vital HP 40 ml/hour ordered  Inpatient Diabetes Program Recommendations:    While intubated: -   Change Novolog 0-9 units Q4 hours -   Add Levemir 5 units  May need addition of Tube Feed Coverage once Tube feeds start  Thanks,  Tama Headings RN, MSN, BC-ADM Inpatient Diabetes Coordinator Team Pager [phone] (8a-5p)

## 2020-11-30 NOTE — Progress Notes (Signed)
ANTICOAGULATION CONSULT NOTE - Initial Consult  Pharmacy Consult for Heparin Indication: chest pain/ACS  No Known Allergies  Patient Measurements: Height: '5\' 11"'$  (180.3 cm) Weight: 83.9 kg (184 lb 15.5 oz) IBW/kg (Calculated) : 75.3  Vital Signs: Temp: 100.2 F (37.9 C) (08/29 0018) Temp Source: Axillary (08/28 1620) BP: 112/47 (08/29 0000) Pulse Rate: 81 (08/29 0018)  Labs: Recent Labs    11/28/20 0429 11/29/20 0023 11/29/20 2243  HGB 15.6 14.5 14.7  HCT 44.2 43.1 44.5  PLT 299 303 415*  CREATININE 1.07 1.21  1.22 1.19  TROPONINIHS  --   --  116*    Estimated Creatinine Clearance: 56.2 mL/min (by C-G formula based on SCr of 1.19 mg/dL).   Medical History: Past Medical History:  Diagnosis Date   Arthritis    BPH (benign prostatic hyperplasia)    CKD (chronic kidney disease), stage II    Complicated UTI (urinary tract infection) Dec.2015   Depression    hx. of   Diabetes mellitus without complication (HCC)    diet controlled   Foley catheter in place 03/20/2014   GERD (gastroesophageal reflux disease)    occasionally   Heart murmur    hx. of at 17 yrs. old   HOH (hard of hearing)    Hypertension    Lower GI bleed 2017   a. ? due to polyp.   Pneumonia    hx. of   Rheumatic fever    when 4 yrs. old   Sleep apnea    does not use c-pap machine   Sodium (Na) deficiency    managed in nursing center12/2015-Feb-2016    Medications:  Scheduled:   aspirin  300 mg Rectal Once   bisacodyl  10 mg Rectal Once   bisacodyl  10 mg Rectal QHS   Chlorhexidine Gluconate Cloth  6 each Topical Daily   enoxaparin (LOVENOX) injection  40 mg Subcutaneous Q24H   insulin aspart  0-5 Units Subcutaneous QHS   insulin aspart  0-6 Units Subcutaneous TID WC   sodium chloride flush  3 mL Intravenous Q12H   sodium chloride flush  3 mL Intravenous Q12H    Assessment: 75 y.o. male s/p cardiac arrest, possible ACS, for heparin  Goal of Therapy:  Heparin level 0.3-0.7  units/ml Monitor platelets by anticoagulation protocol: Yes   Plan:  D/C Lovenox Heparin 4000 units IV bolus, then start heparin 1000 units/hr Check heparin level in 6 hours.   Caryl Pina 11/30/2020,1:43 AM

## 2020-12-01 ENCOUNTER — Inpatient Hospital Stay (HOSPITAL_COMMUNITY): Payer: Medicare Other

## 2020-12-01 DIAGNOSIS — J9601 Acute respiratory failure with hypoxia: Secondary | ICD-10-CM | POA: Diagnosis not present

## 2020-12-01 DIAGNOSIS — G9340 Encephalopathy, unspecified: Secondary | ICD-10-CM | POA: Diagnosis not present

## 2020-12-01 DIAGNOSIS — Z7189 Other specified counseling: Secondary | ICD-10-CM

## 2020-12-01 DIAGNOSIS — I469 Cardiac arrest, cause unspecified: Secondary | ICD-10-CM | POA: Diagnosis not present

## 2020-12-01 DIAGNOSIS — Z66 Do not resuscitate: Secondary | ICD-10-CM

## 2020-12-01 DIAGNOSIS — K56609 Unspecified intestinal obstruction, unspecified as to partial versus complete obstruction: Secondary | ICD-10-CM | POA: Diagnosis not present

## 2020-12-01 LAB — POCT I-STAT 7, (LYTES, BLD GAS, ICA,H+H)
Acid-base deficit: 5 mmol/L — ABNORMAL HIGH (ref 0.0–2.0)
Bicarbonate: 18.7 mmol/L — ABNORMAL LOW (ref 20.0–28.0)
Calcium, Ion: 1.13 mmol/L — ABNORMAL LOW (ref 1.15–1.40)
HCT: 32 % — ABNORMAL LOW (ref 39.0–52.0)
Hemoglobin: 10.9 g/dL — ABNORMAL LOW (ref 13.0–17.0)
O2 Saturation: 93 %
Patient temperature: 98.6
Potassium: 3.5 mmol/L (ref 3.5–5.1)
Sodium: 136 mmol/L (ref 135–145)
TCO2: 20 mmol/L — ABNORMAL LOW (ref 22–32)
pCO2 arterial: 30 mmHg — ABNORMAL LOW (ref 32.0–48.0)
pH, Arterial: 7.404 (ref 7.350–7.450)
pO2, Arterial: 65 mmHg — ABNORMAL LOW (ref 83.0–108.0)

## 2020-12-01 LAB — RENAL FUNCTION PANEL
Albumin: 1.9 g/dL — ABNORMAL LOW (ref 3.5–5.0)
Anion gap: 9 (ref 5–15)
BUN: 22 mg/dL (ref 8–23)
CO2: 19 mmol/L — ABNORMAL LOW (ref 22–32)
Calcium: 7.4 mg/dL — ABNORMAL LOW (ref 8.9–10.3)
Chloride: 105 mmol/L (ref 98–111)
Creatinine, Ser: 1.33 mg/dL — ABNORMAL HIGH (ref 0.61–1.24)
GFR, Estimated: 55 mL/min — ABNORMAL LOW (ref >60)
Glucose, Bld: 209 mg/dL — ABNORMAL HIGH (ref 70–99)
Phosphorus: 1.8 mg/dL — ABNORMAL LOW (ref 2.5–4.6)
Potassium: 3.6 mmol/L (ref 3.5–5.1)
Sodium: 133 mmol/L — ABNORMAL LOW (ref 135–145)

## 2020-12-01 LAB — COMPREHENSIVE METABOLIC PANEL
ALT: 17 U/L (ref 0–44)
AST: 21 U/L (ref 15–41)
Albumin: 1.9 g/dL — ABNORMAL LOW (ref 3.5–5.0)
Alkaline Phosphatase: 33 U/L — ABNORMAL LOW (ref 38–126)
Anion gap: 8 (ref 5–15)
BUN: 23 mg/dL (ref 8–23)
CO2: 20 mmol/L — ABNORMAL LOW (ref 22–32)
Calcium: 7.3 mg/dL — ABNORMAL LOW (ref 8.9–10.3)
Chloride: 105 mmol/L (ref 98–111)
Creatinine, Ser: 1.27 mg/dL — ABNORMAL HIGH (ref 0.61–1.24)
GFR, Estimated: 59 mL/min — ABNORMAL LOW (ref >60)
Glucose, Bld: 199 mg/dL — ABNORMAL HIGH (ref 70–99)
Potassium: 3.4 mmol/L — ABNORMAL LOW (ref 3.5–5.1)
Sodium: 133 mmol/L — ABNORMAL LOW (ref 135–145)
Total Bilirubin: 1 mg/dL (ref 0.3–1.2)
Total Protein: 4.1 g/dL — ABNORMAL LOW (ref 6.5–8.1)

## 2020-12-01 LAB — GLUCOSE, CAPILLARY
Glucose-Capillary: 117 mg/dL — ABNORMAL HIGH (ref 70–99)
Glucose-Capillary: 135 mg/dL — ABNORMAL HIGH (ref 70–99)
Glucose-Capillary: 146 mg/dL — ABNORMAL HIGH (ref 70–99)
Glucose-Capillary: 148 mg/dL — ABNORMAL HIGH (ref 70–99)
Glucose-Capillary: 151 mg/dL — ABNORMAL HIGH (ref 70–99)
Glucose-Capillary: 155 mg/dL — ABNORMAL HIGH (ref 70–99)
Glucose-Capillary: 186 mg/dL — ABNORMAL HIGH (ref 70–99)

## 2020-12-01 LAB — CBC
HCT: 35.8 % — ABNORMAL LOW (ref 39.0–52.0)
Hemoglobin: 11.9 g/dL — ABNORMAL LOW (ref 13.0–17.0)
MCH: 30.3 pg (ref 26.0–34.0)
MCHC: 33.2 g/dL (ref 30.0–36.0)
MCV: 91.1 fL (ref 80.0–100.0)
Platelets: 306 10*3/uL (ref 150–400)
RBC: 3.93 MIL/uL — ABNORMAL LOW (ref 4.22–5.81)
RDW: 13.7 % (ref 11.5–15.5)
WBC: 10.8 10*3/uL — ABNORMAL HIGH (ref 4.0–10.5)
nRBC: 0.2 % (ref 0.0–0.2)

## 2020-12-01 LAB — HEPARIN LEVEL (UNFRACTIONATED)
Heparin Unfractionated: <0.1 IU/mL — ABNORMAL LOW (ref 0.30–0.70)
Heparin Unfractionated: 0.15 IU/mL — ABNORMAL LOW (ref 0.30–0.70)

## 2020-12-01 MED ORDER — POTASSIUM CHLORIDE 20 MEQ PO PACK: 40.0000 meq | PACK | Freq: Once | ORAL | Status: DC

## 2020-12-01 MED ORDER — MIDAZOLAM HCL 2 MG/2ML IJ SOLN
2.0000 mg | Freq: Once | INTRAMUSCULAR | Status: AC
  Administered 2020-12-01: 2 mg via INTRAVENOUS
  Filled 2020-12-01: qty 2

## 2020-12-01 MED ORDER — POTASSIUM CHLORIDE 10 MEQ/50ML IV SOLN
10.0000 meq | Freq: Once | INTRAVENOUS | Status: AC
  Administered 2020-12-01: 10 meq via INTRAVENOUS
  Filled 2020-12-01: qty 50

## 2020-12-01 MED ORDER — ACETAMINOPHEN 650 MG RE SUPP
650.0000 mg | RECTAL | Status: DC | PRN
  Administered 2020-12-01 – 2020-12-03 (×2): 650 mg via RECTAL
  Filled 2020-12-01: qty 1

## 2020-12-01 MED ORDER — SODIUM CHLORIDE 0.9 % IV SOLN
1.5000 g | Freq: Four times a day (QID) | INTRAVENOUS | Status: DC
  Filled 2020-12-01 (×3): qty 4

## 2020-12-01 MED ORDER — POTASSIUM CHLORIDE 10 MEQ/50ML IV SOLN
10.0000 meq | INTRAVENOUS | Status: AC
  Administered 2020-12-01 (×3): 10 meq via INTRAVENOUS
  Filled 2020-12-01 (×3): qty 50

## 2020-12-01 MED ORDER — HEPARIN BOLUS VIA INFUSION
2000.0000 [IU] | Freq: Once | INTRAVENOUS | Status: AC
  Administered 2020-12-01: 2000 [IU] via INTRAVENOUS
  Filled 2020-12-01: qty 2000

## 2020-12-01 MED ORDER — IPRATROPIUM-ALBUTEROL 0.5-2.5 (3) MG/3ML IN SOLN
3.0000 mL | Freq: Once | RESPIRATORY_TRACT | Status: AC
  Administered 2020-12-01: 3 mL via RESPIRATORY_TRACT
  Filled 2020-12-01: qty 3

## 2020-12-01 MED ORDER — AMPICILLIN-SULBACTAM SODIUM 1.5 (1-0.5) G IJ SOLR
1.5000 g | Freq: Three times a day (TID) | INTRAMUSCULAR | Status: DC
  Administered 2020-12-01 – 2020-12-02 (×3): 1.5 g via INTRAVENOUS
  Filled 2020-12-01 (×4): qty 4

## 2020-12-01 MED ORDER — ACETAMINOPHEN 325 MG PO TABS
650.0000 mg | ORAL_TABLET | ORAL | Status: DC | PRN
  Administered 2020-12-11 – 2020-12-21 (×4): 650 mg
  Filled 2020-12-01 (×4): qty 2

## 2020-12-01 MED ORDER — HEPARIN BOLUS VIA INFUSION
2500.0000 [IU] | Freq: Once | INTRAVENOUS | Status: AC
  Administered 2020-12-01: 2500 [IU] via INTRAVENOUS
  Filled 2020-12-01: qty 2500

## 2020-12-01 NOTE — Progress Notes (Signed)
PT Cancellation Note  Patient Details Name: VANDER YAP MRN: [id number] DOB: [birth year]   Cancelled Treatment:    Reason Eval/Treat Not Completed: Patient not medically ready (Pt extubated 2hrs ago and per RN not yet appropriate for therapy)   Aleecia Tapia B Mirl Hillery 12/01/2020, 12:58 PM Bayard Males, PT Acute Rehabilitation Services Pager: [phone] Office: [phone]

## 2020-12-01 NOTE — Procedures (Signed)
Extubation Procedure Note  Patient Details:   Name: Albert Garrison DOB: 12/17/1945 MRN: [id number]   Airway Documentation:    Vent end date: 12/01/20 Vent end time: 1045   Evaluation  O2 sats: stable throughout Complications: No apparent complications Patient did tolerate procedure well. Bilateral Breath Sounds: Clear, Diminished   Yes  Patient extubated per MD order. Positive cuff leak. No stridor noted. Patient placed on 6L [place] with sats 88-90%. MD okay with sat goal of 88% and above. Patient placed on salter at [place] with humidification. RN at bedside.  Herbie Saxon Carolyna Yerian 12/01/2020, 10:51 AM

## 2020-12-01 NOTE — Progress Notes (Signed)
ANTICOAGULATION CONSULT NOTE -   Pharmacy Consult for Heparin Indication: chest pain/ACS  No Known Allergies  Patient Measurements: Height: 5' 11" (180.3 cm) Weight: 91.2 kg (201 lb 1 oz) IBW/kg (Calculated) : 75.3  Vital Signs: Temp: 100.6 F (38.1 C) (08/30 1556) Temp Source: Axillary (08/30 1556) BP: 160/80 (08/30 1900) Pulse Rate: 79 (08/30 1900)  Labs: Recent Labs    11/29/20 2243 11/30/20 0224 11/30/20 0225 11/30/20 0503 11/30/20 0730 11/30/20 0954 11/30/20 2231 12/01/20 0410 12/01/20 0521 12/01/20 0540 12/01/20 0844 12/01/20 1941  HGB 14.7  --  13.6  --   --   --   --  11.9* 10.9*  --   --   --   HCT 44.5  --  41.8  --   --   --   --  35.8* 32.0*  --   --   --   PLT 415*  --  377  --   --   --   --  306  --   --   --   --   HEPARINUNFRC  --   --   --   --   --    < > <0.10*  --   --   --  <0.10* 0.15*  CREATININE 1.19 1.30*  --   --   --   --   --  1.33*  --  1.27*  --   --   TROPONINIHS 116* 263*  --  306* 285*  --   --   --   --   --   --   --    < > = values in this interval not displayed.     Estimated Creatinine Clearance: 57.2 mL/min (A) (by C-G formula based on SCr of 1.27 mg/dL (H)).   Medical History: Past Medical History:  Diagnosis Date   Arthritis    BPH (benign prostatic hyperplasia)    CKD (chronic kidney disease), stage II    Complicated UTI (urinary tract infection) 03/2014   Depression    GERD (gastroesophageal reflux disease)    History of pneumonia    History of stroke    HOH (hard of hearing)    Hypertension    Hyponatremia    Lower GI bleed 2017   a. ? due to polyp.   NSVT (nonsustained ventricular tachycardia) (HCC)    Rheumatic fever    Childhood   Sleep apnea    Does not use CPAP   Type 2 diabetes mellitus (HCC)     Medications:  Scheduled:   bisacodyl  10 mg Rectal Once   bisacodyl  10 mg Rectal QHS   chlorhexidine gluconate (MEDLINE KIT)  15 mL Mouth Rinse BID   Chlorhexidine Gluconate Cloth  6 each Topical  Daily   insulin aspart  0-9 Units Subcutaneous Q4H   mouth rinse  15 mL Mouth Rinse 10 times per day   sodium chloride flush  3 mL Intravenous Q12H   sodium chloride flush  3 mL Intravenous Q12H    Assessment: 75 y.o. male s/p cardiac arrest. He is on heparin for possible ACS. Possible cath on 8/31 -heparin level= 0.15 after heparin bolus and increase to 1800 units/hr  Goal of Therapy:  Heparin level 0.3-0.7 units/ml Monitor platelets by anticoagulation protocol: Yes   Plan:  Rebolus heparin 2000 units IV once Increase heparin infusion to 2050 units/hr. Check heparin level in 8 hours and daily Monitor H&H and s/s of bleeding.  Thank you for allowing  pharmacy to participate in this patient's care.  Hildred Laser, PharmD Clinical Pharmacist **Pharmacist phone directory can now be found on [email or web address] ([email or web address]).  Listed under [place].

## 2020-12-01 NOTE — Progress Notes (Addendum)
NAME:  Albert Garrison, MRN:  [id number], DOB:  20-Feb-1946, LOS: 3 ADMISSION DATE:  11/27/2020, CONSULTATION DATE:  8/29 REFERRING MD:  Dr. Denton Brick, CHIEF COMPLAINT:  Diarrhea   History of Present Illness:  75 y/o M who presented to [facility] on 8/26 with reports of diarrhea.   Per chart, he reported diarrhea for several days with associated nausea, poor intake and decreased appetite.  Reportedly has had 4 COVID vaccines.  Initial work up notable for hyponatremia with initial Na+ of 118, AKI, glucosuria (>500 with 20 ketones), WBC 14.3. RVP was negative.   He was altered on admission thought secondary to symptomatic hyponatremia.  He developed nausea with vomiting on 8/26 requiring zofran.  He did have a BM that day (documented as [place] 1).  There were concerns for abdominal distention & XRAY was ordered 8/27 which showed multiple dilated small bowel loops concerning for distal SBO.  He was seen by surgery and recommended for conservative therapy.  NGT was placed and patient was made NPO. Subsequent CT of the Abd/Pelvis showed small bowel obstruction with likely transition point in the right mid abdomen, diverticulosis without acute diverticulitis, trace bilateral pleural effusions. ECHO completed 8/27 which showed grade I diastolic dysfunction, normal EF. Per notes, he became more agitated / pulling NGT multiple times 8/28 overnight.  He was started on precedex per [facility].  He was documented as having more frequent ectopy - VT during the day 8/28. Late PM 8/28, he was noted to have a rhythm change with PVC's, LBBB.  He became unresponsive with agonal respirations. CODE BLUE was called, ACLS initiated.  Patient was intubated.  ROSC was achieved after approximately 5 minutes resuscitation efforts. There was concerns for possible STEMI and he was started on Heparin infusion & amiodarone. Cardiology was consulted. Troponin peaked at 306.  Per notes, he was felt to need non-emergent cardiac evaluation once recovered  from acute illness.    Given complexity of issues, [facility] requested transfer to [facility] 8/29.    Pertinent  Medical History  CKD II Hyponatremia - prior admit in 2015  Depression  CVA HOH HTN NSVT  OSA - does not use CPAP  Former Smoker - quit 1977, 21 pack years  Former ETOH - quit 2015  DM II  GERD  Lower GIB - thought secondary to polyp  Rheumatic Fever - childhood Heart Murmur   Significant Hospital Events: Including procedures, antibiotic start and stop dates in addition to other pertinent events   8/26 Admit 8/26 N/V 8/27 KUB with concern for distal SBO. ECHO with LVEF 55-60%, no RWMA, grade I diastolic dysfunction [id number] CT ABD with small bowel obstruction with likely transition point in the right mid abdomen.  CT Head negative.  8/28 cardiac arrest >>NSVT with somewhat more sustained runs, progressive IVCD by ECG, reportedly bradycardia and unresponsiveness.  He was treated with atropine and epinephrine, had compressions under a minute with ROSC, no shocks delivered.  He was intubated and did require initiation of pressors including Levophed and vasopressin, also started on amiodarone 8/29 Transfer to [facility] for surgical / cardiac ongoing evaluation   Interim History / Subjective:  No complaints. Weaning well on PSV 5/5.   Objective   Blood pressure 107/63, pulse 66, temperature 98.2 F (36.8 C), temperature source Axillary, resp. rate 20, height '5\' 11"'$  (1.803 m), weight 91.2 kg, SpO2 98 %.    Vent Mode: PSV;CPAP FiO2 (%):  [40 %] 40 % Set Rate:  [14 bmp-18 bmp] 14 bmp Vt  Set:  [600 mL] 600 mL PEEP:  [5 cmH20] 5 cmH20 Pressure Support:  [8 cmH20] 8 cmH20 Plateau Pressure:  [13 cmH20-17 cmH20] 13 cmH20   Intake/Output Summary (Last 24 hours) at 12/01/2020 0854 Last data filed at 12/01/2020 0600 Gross per 24 hour  Intake 4826.93 ml  Output 2270 ml  Net 2556.93 ml    Filed Weights   11/29/20 0326 11/30/20 0500 12/01/20 0434  Weight: 83.9 kg 89.9 kg 91.2 kg     Examination:  General:  elderly appearing gentleman in NAD on vent Neuro:  Alert, interacts appropriately.  HEENT:  [place]/AT, No JVD noted, PERRL.  Cardiovascular:  RRR, no MRG. Frequent PVC on telemetry. Lungs:  Clear bilateral breath sounds.  Abdomen:  Soft, non-distended. Normoactive bowel sounds.  Musculoskeletal:  No acute deformity or ROM limitation.  Skin:  Intact, MMM  CXR 8/30 personally reviewed: tubes and lines stable. Interstitial prominence improved. Stable small left pleural effusion.    Resolved Hospital Problem list     Assessment & Plan:   NSVT  Bradycardic Arrest  Multiple electrolyte disturbances.vs precedex related. Following commands post arrest.  Circulatory shock -Levophed weaned off -Back in NSR now, frequent PVC. Plan to stop amiodarone 9/1 if no recurrent AF.  -Cardiology recommending non-urgent ischemic testing -Heparin infusion for now  Acute resp failure hypoxic secondary to cardiac arrest. - Weaning to expected extubation this morning.   Nausea / Vomiting with SBO: Concern for transition point on CT abd/pelvis  -Surgery following - Continue NG to LIS - No indication for surgical intervention.   Fever: no obvious source. ? Aspiration during arrest. Cultures pending. Very mild leukocytosis.  - Monitor off antibiotics. Low threshold to initiate.   Symptomatic Hyponatremia  -Improved with normal saline  -Continue NS, decrease to 87m/Hr as he is 6L postitive Trend NA and Cl  AKI  -Nonoliguric  -Monitor lytes daily for  DM II  Poorly controlled, admit A1c 8.6.   -SSI  -hold home metformin    Best Practice (right click and "Reselect all SmartList Selections" daily)   Diet/type: NPO DVT prophylaxis: prophylactic heparin  GI prophylaxis: PPI Lines: Central line and yes and it is still needed Foley:  Yes, and it is still needed Code Status:  full code Last date of multidisciplinary goals of care discussion [NA]  Critical care time 39  minutes   PGeorgann Housekeeper AGACNP-BC LButlerfor personal pager PCCM on call pager ([phone] 7pm. Please call Elink 7p-7a. [phone] 12/01/2020 9:14 AM   '

## 2020-12-01 NOTE — Progress Notes (Addendum)
Subjective: No acute changes. Off pressors, hemodynamically stable. No bowel movements overnight. Minimal NG output.    Objective: Vital signs in last 24 hours: Temp:  [98.2 F (36.8 C)-101.7 F (38.7 C)] 98.2 F (36.8 C) (08/30 0719) Pulse Rate:  [56-95] 64 (08/30 0645) Resp:  [14-23] 14 (08/30 0645) BP: (91-165)/(46-90) 107/63 (08/30 0645) SpO2:  [93 %-100 %] 95 % (08/30 0645) FiO2 (%):  [40 %-55 %] 40 % (08/30 0426) Weight:  [91.2 kg] 91.2 kg (08/30 0434) Last BM Date: 11/30/20  Intake/Output from previous day: 08/29 0701 - 08/30 0700 In: 4826.9 [I.V.:3526; IV Piggyback:1300.9] Out: 2270 [Urine:2120; Emesis/NG output:150] Intake/Output this shift: No intake/output data recorded.  PE: General: resting in bed, sedated Resp: intubated, on vent HEENT: NG in place to suction with clear gastric contents in tubing Abdomen: mildly distended but soft and compressible Extremities: warm and well-perfused   Lab Results:  Recent Labs    11/29/20 2243 11/30/20 0225 12/01/20 0521  WBC 15.8* 12.9*  --   HGB 14.7 13.6 10.9*  HCT 44.5 41.8 32.0*  PLT 415* 377  --    BMET Recent Labs    11/30/20 0224 12/01/20 0521 12/01/20 0540  NA 130* 136 133*  K 4.5 3.5 3.4*  CL 100  --  105  CO2 21*  --  20*  GLUCOSE 242*  --  199*  BUN 22  --  23  CREATININE 1.30*  --  1.27*  CALCIUM 7.3*  --  7.3*   PT/INR No results for input(s): LABPROT, INR in the last 72 hours. CMP     Component Value Date/Time   NA 133 (L) 12/01/2020 0540   K 3.4 (L) 12/01/2020 0540   CL 105 12/01/2020 0540   CO2 20 (L) 12/01/2020 0540   GLUCOSE 199 (H) 12/01/2020 0540   BUN 23 12/01/2020 0540   CREATININE 1.27 (H) 12/01/2020 0540   CALCIUM 7.3 (L) 12/01/2020 0540   CALCIUM 8.2 (L) 03/26/2014 0556   PROT 4.1 (L) 12/01/2020 0540   ALBUMIN 1.9 (L) 12/01/2020 0540   AST 21 12/01/2020 0540   ALT 17 12/01/2020 0540   ALKPHOS 33 (L) 12/01/2020 0540   BILITOT 1.0 12/01/2020 0540   GFRNONAA  59 (L) 12/01/2020 0540   GFRAA 41 (L) 08/28/2018 1217   Lipase     Component Value Date/Time   LIPASE 19 11/27/2020 0943       Studies/Results: DG Chest 1 View  Result Date: 11/29/2020 CLINICAL DATA:  Status post intubation. EXAM: CHEST  1 VIEW COMPARISON:  Chest radiograph dated 11/29/2020. FINDINGS: Endotracheal tube approximately 7 cm above the carina. Enteric tube extends below the diaphragm with tip in the left upper abdomen likely in the gastric fundus. Bibasilar streaky atelectasis. Developing infiltrate is less likely. No focal consolidation, pleural effusion or pneumothorax. Stable cardiac silhouette. Probable hiatal hernia. No acute osseous pathology. Cervical fusion hardware. IMPRESSION: Endotracheal tube above the carina. Electronically Signed   By: Anner Crete M.D.   On: 11/29/2020 23:10   DG Abd 1 View  Result Date: 11/30/2020 CLINICAL DATA:  Bowel obstruction EXAM: ABDOMEN - 1 VIEW COMPARISON:  CT 11/29/2020 FINDINGS: Esophageal tube tip looped over the stomach. Persistent dilatation of small bowel measuring up to 5 cm consistent with bowel obstruction. IMPRESSION: Persistent dilatation of small bowel consistent with obstruction. Electronically Signed   By: Donavan Foil M.D.   On: 11/30/2020 23:44   CT HEAD WO CONTRAST (5MM)  Result Date:  11/29/2020 CLINICAL DATA:  Encephalopathy EXAM: CT HEAD WITHOUT CONTRAST TECHNIQUE: Contiguous axial images were obtained from the base of the skull through the vertex without intravenous contrast. COMPARISON:  08/22/2017 FINDINGS: Brain: There is no mass, hemorrhage or extra-axial collection. There is generalized atrophy without lobar predilection. Hypodensity of the white matter is most commonly associated with chronic microvascular disease. 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 Vascular: No abnormal hyperdensity of the major intracranial arteries or dural venous sinuses. No intracranial atherosclerosis.  Skull: The visualized skull base, calvarium and extracranial soft tissues are normal. Sinuses/Orbits: No fluid levels or advanced mucosal thickening of the visualized paranasal sinuses. No mastoid or middle ear effusion. The orbits are normal. IMPRESSION: 1. No acute intracranial abnormality. 2. Generalized atrophy and chronic microvascular ischemia. Electronically Signed   By: Ulyses Jarred M.D.   On: 11/29/2020 19:31   CT ABDOMEN PELVIS W CONTRAST  Result Date: 11/29/2020 CLINICAL DATA:  Bowel obstruction suspected. C.o decreased appetite, nausea, poor p.o. intake. He states that he was constipated for the last few days but last night began experiencing intermittent diarrhea. HX CKD, HTN, GI bleed, DM EXAM: CT ABDOMEN AND PELVIS WITH CONTRAST TECHNIQUE: Multidetector CT imaging of the abdomen and pelvis was performed using the standard protocol following bolus administration of intravenous contrast. CONTRAST:  31m OMNIPAQUE IOHEXOL 350 MG/ML SOLN COMPARISON:  None. FINDINGS: Lines and tubes: Enteric tube noted coursing below the hemidiaphragm with tip within the gastric lumen and side port at the gastroesophageal junction. Foley catheter tip and balloon terminate within the urinary bladder. Lower chest: Trace bilateral pleural effusions, left greater than right. Hepatobiliary: The hepatic parenchyma is diffusely hypodense compared to the splenic parenchyma consistent with fatty infiltration. No focal liver abnormality. No gallstones, gallbladder wall thickening, or pericholecystic fluid. No biliary dilatation. Pancreas: Diffusely atrophic. No focal lesion. Otherwise normal pancreatic contour. No surrounding inflammatory changes. No main pancreatic ductal dilatation. Spleen: Normal in size without focal abnormality. Adrenals/Urinary Tract: No adrenal nodule bilaterally. Bilateral kidneys enhance symmetrically. Bilateral renal cortical scarring. Pericentimeter fluid density lesion within the right kidney likely  represents a simple renal cyst. Subcentimeter hypodensity within left kidney too small to characterize. No hydronephrosis. No hydroureter. The urinary bladder is decompressed. Stomach/Bowel: Stomach is within normal limits. Diffuse fluid dilatation of the proximal and mid small bowel with a likely transition point within the right mid abdomen (5:42). The distal small bowel is decompressed. The large bowel is decompressed. No evidence of large bowel wall thickening or dilatation. Diffuse left colon and sigmoid diverticulosis. Appendix appears normal. Vascular/Lymphatic: No abdominal aorta or iliac aneurysm. Severe atherosclerotic plaque of the aorta and its branches. No abdominal, pelvic, or inguinal lymphadenopathy. Reproductive: Prostate is unremarkable. Other: Trace free fluid within the pelvis. No intraperitoneal free gas. No organized fluid collection. Musculoskeletal: Bilateral trace volume inguinal hernias. No suspicious lytic or blastic osseous lesions. No acute displaced fracture. Limited evaluation of the visualized ribs due to motion artifact. Multilevel degenerative changes of the spine. IMPRESSION: 1. Small-bowel obstruction with a likely transition point within the right mid abdomen. 2. Enteric tube with tip within the gastric lumen side port but side port at the gastroesophageal junction. Consider advancing by 3 cm. 3. Colonic diverticulosis with no acute diverticulitis. 4. Bilateral trace pleural effusions. 5.  Aortic Atherosclerosis (ICD10-I70.0). Electronically Signed   By: MIven FinnM.D.   On: 11/29/2020 20:20   DG CHEST PORT 1 VIEW  Result Date: 11/30/2020 CLINICAL DATA:  Hypoxia  EXAM: PORTABLE CHEST 1 VIEW COMPARISON:  11/30/2020, 11/29/2020, 11/28/2020 FINDINGS: Endotracheal tube tip about 3.9 cm superior to carina. Esophageal tube tip overlies the stomach. Left IJ central venous catheter tip over the venous confluence. Probable small left-sided pleural effusion. Increased left basilar  airspace disease. Increasing hazy right base airspace disease. Stable cardiomediastinal silhouette IMPRESSION: 1. Similar appearance of support lines and tubes. 2. Small left-sided pleural effusion with increasing airspace disease at left base. Slight increased right infrahilar airspace disease. Electronically Signed   By: Donavan Foil M.D.   On: 11/30/2020 23:46   DG CHEST PORT 1 VIEW  Result Date: 11/30/2020 CLINICAL DATA:  Status post PICC line placement EXAM: PORTABLE CHEST 1 VIEW COMPARISON:  11/29/2020 FINDINGS: Left IJ venous catheter terminates in the upper SVC. Endotracheal tube terminates 4 cm above the carina. Enteric tube terminates in the gastric cardia. No frank interstitial edema. Small bilateral pleural effusions, right greater than left. The heart is normal in size. Defibrillator pad overlying the left hemithorax. Cervical spine fixation hardware, incompletely visualized. IMPRESSION: Left IJ venous catheter terminates in the upper SVC. Otherwise unchanged. Electronically Signed   By: Julian Hy M.D.   On: 11/30/2020 02:14   DG CHEST PORT 1 VIEW  Result Date: 11/29/2020 CLINICAL DATA:  Altered mental status. NG tube placement verification. EXAM: PORTABLE CHEST 1 VIEW COMPARISON:  Chest x-ray dated 11/28/2020. FINDINGS: Enteric tube appears adequately positioned in the stomach. Heart size and mediastinal contours are stable. Lungs are clear. No pleural effusion is seen. IMPRESSION: Enteric tube appears adequately positioned in the stomach. No active disease. Electronically Signed   By: Franki Cabot M.D.   On: 11/29/2020 09:51   ECHOCARDIOGRAM LIMITED  Result Date: 11/30/2020    ECHOCARDIOGRAM LIMITED REPORT   Patient Name:   Albert Garrison Date of Exam: 11/30/2020 Medical Rec #:  [id number]       Height:       71.0 in Accession #:    [id number]      Weight:       198.2 lb Date of Birth:  1945/12/22       BSA:          2.100 m Patient Age:    75 years        BP:           106/54  mmHg Patient Gender: M               HR:           66 bpm. Exam Location:  [facility] Procedure: Limited Echo, Cardiac Doppler and Limited Color Doppler Indications:    eval LVEF  History:        Patient has prior history of Echocardiogram examinations. Risk                 Factors:Diabetes and Hypertension. Ventricular Tachycardia                 I47.2.  Sonographer:    Alvino Chapel RCS Referring Phys: [id number] [facility]  Sonographer Comments: Patient on mechanical ventilator at time of echo. IMPRESSIONS  1. Left ventricular ejection fraction, by estimation, is 65 to 70%. The left ventricle has normal function. The left ventricle has no regional wall motion abnormalities. There is mild left ventricular hypertrophy. Left ventricular diastolic parameters are indeterminate.  2. Right ventricular systolic function is normal. The right ventricular size is normal. Tricuspid regurgitation signal is inadequate for assessing PA pressure.  3.  The mitral valve is grossly normal.  4. Aortic valve regurgitation is mild.  5. Aortic dilatation noted. There is mild dilatation of the aortic root, measuring 42 mm. Comparison(s): Prior images reviewed side by side. Limited study. LVEF remains normal range. FINDINGS  Left Ventricle: Left ventricular ejection fraction, by estimation, is 65 to 70%. The left ventricle has normal function. The left ventricle has no regional wall motion abnormalities. The left ventricular internal cavity size was normal in size. There is  mild left ventricular hypertrophy. Left ventricular diastolic parameters are indeterminate. Right Ventricle: The right ventricular size is normal. No increase in right ventricular wall thickness. Right ventricular systolic function is normal. Tricuspid regurgitation signal is inadequate for assessing PA pressure. Pericardium: Presence of pericardial fat pad. Mitral Valve: The mitral valve is grossly normal. There is mild calcification of the mitral valve leaflet(s).  Aortic Valve: Aortic valve regurgitation is mild. Aorta: Aortic dilatation noted. There is mild dilatation of the aortic root, measuring 42 mm. Venous: IVC assessment for right atrial pressure unable to be performed due to mechanical ventilation. IAS/Shunts: No atrial level shunt detected by color flow Doppler. LEFT VENTRICLE PLAX 2D LVIDd:         4.40 cm LVIDs:         2.70 cm LV PW:         1.30 cm LV IVS:        1.30 cm LVOT diam:     2.30 cm LVOT Area:     4.15 cm  LEFT ATRIUM         Index LA diam:    2.00 cm 0.95 cm/m   AORTA Ao Root diam: 4.20 cm MITRAL VALVE MV Area (PHT): 2.33 cm    SHUNTS MV Decel Time: 326 msec    Systemic Diam: 2.30 cm MV E velocity: 62.10 cm/s MV A velocity: 82.70 cm/s MV E/A ratio:  0.75 Rozann Lesches MD Electronically signed by Rozann Lesches MD Signature Date/Time: 11/30/2020/2:13:35 PM    Final       Assessment/Plan 75 yo male with a likely partial small bowel obstruction, transferred after bradycardic arrest. Abdominal exam remains benign. No acidosis or clinical findings to suggest bowel ischemia. Continue NG tube to low intermittent suction. No indication for urgent surgical intervention. Remainder of care per primary team. Surgery will continue to follow.    LOS: 3 days    Michaelle Birks, MD [facility] Surgery General, Hepatobiliary and Pancreatic Surgery 12/01/20 7:48 AM

## 2020-12-01 NOTE — Progress Notes (Signed)
[facility] Progress Note Patient Name: Albert Garrison DOB: [birth year] MRN: [id number]   Date of Service  12/01/2020  HPI/Events of Note  Bedside RN reports that she's not able to place NG tube despite multiple attempts, day shift tried as well and was unsuccessful. Patient is not nauseated or vomiting, he has bowel sounds and movements.  eICU Interventions  RN told it's okay to defer further attempts until A.M.        Frederik Pear 12/01/2020, 10:36 PM

## 2020-12-01 NOTE — Consult Note (Signed)
Palliative Care Consult Note                                  Date: 12/01/2020   Patient Name: Albert Garrison  DOB: 1945/10/14  MRN: [id number]  Age / Sex: 75 y.o., male  PCP: Celene Squibb, MD Referring Physician: Maryjane Hurter, MD  Reason for Consultation: Establishing goals of care  HPI/Patient Profile: Palliative Care consult requested for goals of care discussion in this 75 y.o. male  with past medical history of diabetes, BPH, CKD II, depression, tobacco and alcohol use, stroke, hypertension, GERD, hard of hearing, and sleep apnea (no CPAP use). He was admitted on 11/27/2020 from home with complaints of generalized weakness, nausea and vomiting x24 hours. During work-up abdominal x-ray showed concern for distal SBO. Surgery evaluated with recommendations for medical management and NG placement. 11/29/2020 he suffered a cardiac arrest with ROSC under a minute. Remains intubated and being followed by Cardiology for suspected STEMI. Patient was transferred to [facility] on 11/30/2020 for further evaluation and successfully extubated 12/01/2020.   Past Medical History:  Diagnosis Date  . Arthritis   . BPH (benign prostatic hyperplasia)   . CKD (chronic kidney disease), stage II   . Complicated UTI (urinary tract infection) 03/2014  . Depression   . GERD (gastroesophageal reflux disease)   . History of pneumonia   . History of stroke   . HOH (hard of hearing)   . Hypertension   . Hyponatremia   . Lower GI bleed 2017   a. ? due to polyp.  Marland Kitchen NSVT (nonsustained ventricular tachycardia) ([place])   . Rheumatic fever    Childhood  . Sleep apnea    Does not use CPAP  . Type 2 diabetes mellitus (HCC)      Subjective:   This NP Osborne Oman reviewed medical records, received report from team, assessed the patient and then met at the patient's bedside with patient to discuss diagnosis, prognosis, GOC, EOL wishes disposition and options.  Mr.  Albert Garrison is awake, alert and oriented to all questions. Denies pain. Able to follow commands.    Concept of Palliative Care was introduced as specialized medical care for people and their families living with serious illness.  It focuses on providing relief from the symptoms and stress of a serious illness.  The goal is to improve quality of life for both the patient and the family. Values and goals of care important to patient and family were attempted to be elicited.  I created space and opportunity for patient to explore state of health prior to admission, thoughts, and feelings. He states he does not remember what happened from the time he came into the hospital, however is able to recap that he was told by the medical team.   Prior to admission he states he was independent with all ADLs. Driving and ambulatory without assistive devices.   We discussed His current illness and what it means in the larger context of His on-going co-morbidities. Natural disease trajectory and expectations were discussed.  Isayah verbalized understanding of current illness and co-morbidities. He states "everyone gets sick" and he guesses he was sicker than he thought. States he is grateful the care from the medical team and his goal is to continue with all care with hopes he will eventually return home and to baseline.   I discussed the importance of continued conversation with family and  their medical providers regarding overall plan of care and treatment options, ensuring decisions are within the context of the patients values and GOCs.  Questions and concerns were addressed.  Patient was encouraged to call with questions or concerns.  PMT will continue to support holistically as needed.  Life Review Patient lives in his home alone. He has a brother who he is close with. His aunt [name] lives near him and involved in his life. 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 Enjoys [employer] and being outside.     Objective:   Primary Diagnoses: Present on Admission: . Hyponatremia . Acute encephalopathy . SBO (small bowel obstruction) ([place]) . Cardiac arrest ([place]) . Other specified cardiac arrhythmias--- sleep apnea- arrhythmias when he desaturates  . Endotracheally intubated   Scheduled Meds: . bisacodyl  10 mg Rectal Once  . bisacodyl  10 mg Rectal QHS  . chlorhexidine gluconate (MEDLINE KIT)  15 mL Mouth Rinse BID  . Chlorhexidine Gluconate Cloth  6 each Topical Daily  . insulin aspart  0-9 Units Subcutaneous Q4H  . mouth rinse  15 mL Mouth Rinse 10 times per day  . pantoprazole (PROTONIX) IV  40 mg Intravenous Q24H  . sodium chloride flush  3 mL Intravenous Q12H  . sodium chloride flush  3 mL Intravenous Q12H    Continuous Infusions: . sodium chloride 100 mL/hr at 12/01/20 1100  . sodium chloride    . sodium chloride Stopped (11/30/20 0009)  . amiodarone 30 mg/hr (12/01/20 1100)  . fentaNYL infusion INTRAVENOUS Stopped (12/01/20 0852)  . heparin 1,800 Units/hr (12/01/20 1111)    PRN Meds: sodium chloride, acetaminophen **OR** acetaminophen, bisacodyl, ondansetron **OR** ondansetron (ZOFRAN) IV, sodium chloride flush  No Known Allergies  Review of Systems  Respiratory:  Positive for shortness of breath.   Neurological:  Positive for weakness.  Unless otherwise noted, a complete review of systems is negative.  Physical Exam General: NAD, elderly appearing  Cardiovascular: regular rate and rhythm Pulmonary: clear ant fields Abdomen: soft, nontender,mild distention, hypoactive Extremities: no edema, no joint deformities Skin: no rashes, warm and dry Neurological:   Vital Signs:  BP (!) 150/74   Pulse 90   Temp (!) 100.6 F (38.1 C) (Oral)   Resp (!) 37   Ht _0  (1.803 m)   Wt 91.2 kg   SpO2 94%   BMI 28.04 kg/m  Pain Scale: CPOT POSS *See Group Information*: 2-Acceptable,Slightly drowsy, easily aroused Pain Score: 0-No pain  SpO2: SpO2: 94 % O2  Device:SpO2: 94 % O2 Flow Rate: .O2 Flow Rate (L/min): 6 L/min  IO: Intake/output summary:  Intake/Output Summary (Last 24 hours) at 12/01/2020 1353 Last data filed at 12/01/2020 1200 Gross per 24 hour  Intake 4550.59 ml  Output 1895 ml  Net 2655.59 ml    LBM: Last BM Date: 11/30/20 Baseline Weight: Weight: 87 kg Most recent weight: Weight: 91.2 kg      Palliative Assessment/Data: PPS 30%   Advanced Care Planning:   Primary Decision Maker: HCPOA-John Hopfer his brother  Code Status/Advance Care Planning: Full code  A discussion was had today regarding advanced directives. Concepts specific to code status, artifical feeding and hydration, continued IV antibiotics and rehospitalization was had.    I discussed at length patient's full code status with consideration to his current illness, recent arrest, and co-morbidities. Mr. Skillman verbalized understanding expressing wishes to remain a full code with hopes of improving.   He states his brother Vandy Tsuchiya is his  HCPOA and provides permission to call. Attempts made however unable to reach and unable to leave voicemail (full). He states his aunt is his additional support.   Patient expresses goals to continue with all treatments aggressively. He is hopeful for improvement.    Assessment & Plan:   SUMMARY OF RECOMMENDATIONS   Full Code-as confirmed by patient  Continue with current plan of care, he is open to all medical interventions with hopes of improving.  Attempted to contact patient's brother however unable to reach. Not able to leave voicemail due to being full.  PMT will continue to support and follow as needed. Please call team line with urgent needs.  Palliative Prophylaxis:  Aspiration and Frequent Pain Assessment  Additional Recommendations (Limitations, Scope, Preferences): Full Scope Treatment  Psycho-social/Spiritual:  Desire for further Chaplaincy support: no Additional Recommendations:  ongoing  discussions   Prognosis:  Guarded   Discharge Planning:  To Be Determined    Patient expressed understanding and was in agreement with this plan.   Time In: 1355 Time Out: 1445 Time Total: 50 min.   Visit consisted of counseling and education dealing with the complex and emotionally intense issues of symptom management and palliative care in the setting of serious and potentially life-threatening illness.Greater than 50%  of this time was spent counseling and coordinating care related to the above assessment and plan.  Signed by:  Alda Lea, AGPCNP-BC Palliative Medicine Team  Phone: [phone] Pager: [phone] Amion: Bjorn Pippin   Thank you for allowing the Palliative Medicine Team to assist in the care of this patient. Please utilize secure chat with additional questions, if there is no response within 30 minutes please call the above phone number. Palliative Medicine Team providers are available by phone from 7am to 5pm daily and can be reached through the team cell phone.  Should this patient require assistance outside of these hours, please call the patient's attending physician.

## 2020-12-01 NOTE — Progress Notes (Signed)
Pharmacy Antibiotic Note  Albert Garrison is a 75 y.o. male admitted on 11/27/2020 with aspiration pneumonia.  Pharmacy has been consulted for Unasyn dosing. CrCL - 57 ml/min  Plan: Start Unasyn 1.5 gm IV q8hr Monitor renal function, clinical status, and C&S.  Height: '5\' 11"'$  (180.3 cm) Weight: 91.2 kg (201 lb 1 oz) IBW/kg (Calculated) : 75.3  Temp (24hrs), Avg:99.7 F (37.6 C), Min:98.2 F (36.8 C), Max:101.7 F (38.7 C)  Recent Labs  Lab 11/28/20 0429 11/29/20 0023 11/29/20 2243 11/30/20 0224 11/30/20 0225 11/30/20 0503 11/30/20 0850 12/01/20 0410 12/01/20 0540  WBC 12.3* 11.9* 15.8*  --  12.9*  --   --  10.8*  --   CREATININE 1.07 1.21  1.22 1.19 1.30*  --   --   --  1.33* 1.27*  LATICACIDVEN  --   --  5.0*  --  3.6* 3.9* 1.3  --   --     Estimated Creatinine Clearance: 57.2 mL/min (A) (by C-G formula based on SCr of 1.27 mg/dL (H)).    No Known Allergies  Antimicrobials this admission: Cefepime 8/29 >>8/30 Vanco 8/29 >>8/30 Unasyn 8/30>>  Thank you for allowing pharmacy to be a part of this patient's care.  Alanda Slim, PharmD, [facility] Clinical Pharmacist Please see AMION for all Pharmacists' Contact Phone Numbers 12/01/2020, 10:54 AM

## 2020-12-01 NOTE — Progress Notes (Addendum)
Progress Note  Patient Name: LEONIDAS BOATENG Date of Encounter: 12/01/2020  Primary Cardiologist: Carlyle Dolly, MD       Subjective   Intubated and following commands however plan is for extubation this AM.   Inpatient Medications    Scheduled Meds:  bisacodyl  10 mg Rectal Once   bisacodyl  10 mg Rectal QHS   chlorhexidine gluconate (MEDLINE KIT)  15 mL Mouth Rinse BID   Chlorhexidine Gluconate Cloth  6 each Topical Daily   insulin aspart  0-9 Units Subcutaneous Q4H   mouth rinse  15 mL Mouth Rinse 10 times per day   pantoprazole (PROTONIX) IV  40 mg Intravenous Q24H   sodium chloride flush  3 mL Intravenous Q12H   sodium chloride flush  3 mL Intravenous Q12H   Continuous Infusions:  sodium chloride 100 mL/hr at 12/01/20 0838   sodium chloride     sodium chloride Stopped (11/30/20 0009)   amiodarone 30 mg/hr (12/01/20 0838)   fentaNYL infusion INTRAVENOUS 125 mcg/hr (12/01/20 0600)   heparin 1,500 Units/hr (11/30/20 2349)   potassium chloride 10 mEq (12/01/20 0751)   PRN Meds: sodium chloride, acetaminophen **OR** acetaminophen, bisacodyl, ondansetron **OR** ondansetron (ZOFRAN) IV, sodium chloride flush   Vital Signs    Vitals:   12/01/20 0630 12/01/20 0645 12/01/20 0719 12/01/20 0754  BP: 100/61 107/63    Pulse: 61 64  66  Resp: $Remo'14 14  20  'wMSgf$ Temp:   98.2 F (36.8 C)   TempSrc:   Axillary   SpO2: 95% 95%  98%  Weight:      Height:        Intake/Output Summary (Last 24 hours) at 12/01/2020 1008 Last data filed at 12/01/2020 0600 Gross per 24 hour  Intake 4189.3 ml  Output 2270 ml  Net 1919.3 ml   Filed Weights   11/29/20 0326 11/30/20 0500 12/01/20 0434  Weight: 83.9 kg 89.9 kg 91.2 kg    Physical Exam   General: Well developed, well nourished, NAD Neck: Negative for carotid bruits. No JVD Lungs:Clear to ausculation bilaterally. ETT in place Cardiovascular: RRR with S1 S2. No murmurs Abdomen: Soft, non-tender, non-distended. No obvious  abdominal masses. Extremities: No edema. Radial pulses 2+ bilaterally Neuro: Intubated however follows commands  Labs    Chemistry Recent Labs  Lab 11/27/20 0943 11/27/20 1250 11/30/20 0224 12/01/20 0410 12/01/20 0521 12/01/20 0540  NA 118*   < > 130* 133* 136 133*  K 4.0   < > 4.5 3.6 3.5 3.4*  CL 88*   < > 100 105  --  105  CO2 21*   < > 21* 19*  --  20*  GLUCOSE 266*   < > 242* 209*  --  199*  BUN 20   < > 22 22  --  23  CREATININE 1.27*   < > 1.30* 1.33*  --  1.27*  CALCIUM 8.7*   < > 7.3* 7.4*  --  7.3*  PROT 7.0  --  5.6*  --   --  4.1*  ALBUMIN 3.6   < > 2.7* 1.9*  --  1.9*  AST 19  --  32  --   --  21  ALT 16  --  21  --   --  17  ALKPHOS 51  --  44  --   --  33*  BILITOT 1.8*  --  1.4*  --   --  1.0  GFRNONAA 59*   < >  57* 55*  --  59*  ANIONGAP 9   < > 9 9  --  8   < > = values in this interval not displayed.     Hematology Recent Labs  Lab 11/29/20 2243 11/30/20 0225 12/01/20 0410 12/01/20 0521  WBC 15.8* 12.9* 10.8*  --   RBC 4.75 4.46 3.93*  --   HGB 14.7 13.6 11.9* 10.9*  HCT 44.5 41.8 35.8* 32.0*  MCV 93.7 93.7 91.1  --   MCH 30.9 30.5 30.3  --   MCHC 33.0 32.5 33.2  --   RDW 13.1 13.3 13.7  --   PLT 415* 377 306  --     Cardiac EnzymesNo results for input(s): TROPONINI in the last 168 hours. No results for input(s): TROPIPOC in the last 168 hours.   BNPNo results for input(s): BNP, PROBNP in the last 168 hours.   DDimer No results for input(s): DDIMER in the last 168 hours.   Radiology    DG Chest 1 View  Result Date: 11/29/2020 CLINICAL DATA:  Status post intubation. EXAM: CHEST  1 VIEW COMPARISON:  Chest radiograph dated 11/29/2020. FINDINGS: Endotracheal tube approximately 7 cm above the carina. Enteric tube extends below the diaphragm with tip in the left upper abdomen likely in the gastric fundus. Bibasilar streaky atelectasis. Developing infiltrate is less likely. No focal consolidation, pleural effusion or pneumothorax. Stable  cardiac silhouette. Probable hiatal hernia. No acute osseous pathology. Cervical fusion hardware. IMPRESSION: Endotracheal tube above the carina. Electronically Signed   By: Anner Crete M.D.   On: 11/29/2020 23:10   DG Abd 1 View  Result Date: 11/30/2020 CLINICAL DATA:  Bowel obstruction EXAM: ABDOMEN - 1 VIEW COMPARISON:  CT 11/29/2020 FINDINGS: Esophageal tube tip looped over the stomach. Persistent dilatation of small bowel measuring up to 5 cm consistent with bowel obstruction. IMPRESSION: Persistent dilatation of small bowel consistent with obstruction. Electronically Signed   By: Donavan Foil M.D.   On: 11/30/2020 23:44   CT HEAD WO CONTRAST (5MM)  Result Date: 11/29/2020 CLINICAL DATA:  Encephalopathy EXAM: CT HEAD WITHOUT CONTRAST TECHNIQUE: Contiguous axial images were obtained from the base of the skull through the vertex without intravenous contrast. COMPARISON:  08/22/2017 FINDINGS: Brain: There is no mass, hemorrhage or extra-axial collection. There is generalized atrophy without lobar predilection. Hypodensity of the white matter is most commonly associated with chronic microvascular disease. 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 Vascular: No abnormal hyperdensity of the major intracranial arteries or dural venous sinuses. No intracranial atherosclerosis. Skull: The visualized skull base, calvarium and extracranial soft tissues are normal. Sinuses/Orbits: No fluid levels or advanced mucosal thickening of the visualized paranasal sinuses. No mastoid or middle ear effusion. The orbits are normal. IMPRESSION: 1. No acute intracranial abnormality. 2. Generalized atrophy and chronic microvascular ischemia. Electronically Signed   By: Ulyses Jarred M.D.   On: 11/29/2020 19:31   CT ABDOMEN PELVIS W CONTRAST  Result Date: 11/29/2020 CLINICAL DATA:  Bowel obstruction suspected. C.o decreased appetite, nausea, poor p.o. intake. He states that he was constipated for  the last few days but last night began experiencing intermittent diarrhea. HX CKD, HTN, GI bleed, DM EXAM: CT ABDOMEN AND PELVIS WITH CONTRAST TECHNIQUE: Multidetector CT imaging of the abdomen and pelvis was performed using the standard protocol following bolus administration of intravenous contrast. CONTRAST:  30mL OMNIPAQUE IOHEXOL 350 MG/ML SOLN COMPARISON:  None. FINDINGS: Lines and tubes: Enteric tube noted coursing below the  hemidiaphragm with tip within the gastric lumen and side port at the gastroesophageal junction. Foley catheter tip and balloon terminate within the urinary bladder. Lower chest: Trace bilateral pleural effusions, left greater than right. Hepatobiliary: The hepatic parenchyma is diffusely hypodense compared to the splenic parenchyma consistent with fatty infiltration. No focal liver abnormality. No gallstones, gallbladder wall thickening, or pericholecystic fluid. No biliary dilatation. Pancreas: Diffusely atrophic. No focal lesion. Otherwise normal pancreatic contour. No surrounding inflammatory changes. No main pancreatic ductal dilatation. Spleen: Normal in size without focal abnormality. Adrenals/Urinary Tract: No adrenal nodule bilaterally. Bilateral kidneys enhance symmetrically. Bilateral renal cortical scarring. Pericentimeter fluid density lesion within the right kidney likely represents a simple renal cyst. Subcentimeter hypodensity within left kidney too small to characterize. No hydronephrosis. No hydroureter. The urinary bladder is decompressed. Stomach/Bowel: Stomach is within normal limits. Diffuse fluid dilatation of the proximal and mid small bowel with a likely transition point within the right mid abdomen (5:42). The distal small bowel is decompressed. The large bowel is decompressed. No evidence of large bowel wall thickening or dilatation. Diffuse left colon and sigmoid diverticulosis. Appendix appears normal. Vascular/Lymphatic: No abdominal aorta or iliac aneurysm.  Severe atherosclerotic plaque of the aorta and its branches. No abdominal, pelvic, or inguinal lymphadenopathy. Reproductive: Prostate is unremarkable. Other: Trace free fluid within the pelvis. No intraperitoneal free gas. No organized fluid collection. Musculoskeletal: Bilateral trace volume inguinal hernias. No suspicious lytic or blastic osseous lesions. No acute displaced fracture. Limited evaluation of the visualized ribs due to motion artifact. Multilevel degenerative changes of the spine. IMPRESSION: 1. Small-bowel obstruction with a likely transition point within the right mid abdomen. 2. Enteric tube with tip within the gastric lumen side port but side port at the gastroesophageal junction. Consider advancing by 3 cm. 3. Colonic diverticulosis with no acute diverticulitis. 4. Bilateral trace pleural effusions. 5.  Aortic Atherosclerosis (ICD10-I70.0). Electronically Signed   By: Iven Finn M.D.   On: 11/29/2020 20:20   DG CHEST PORT 1 VIEW  Result Date: 12/01/2020 CLINICAL DATA:  Respiratory failure EXAM: PORTABLE CHEST 1 VIEW COMPARISON:  Previous day FINDINGS: Stable appearance of endotracheal tube midthoracic trachea. Cervical hardware partially visualized, grossly unchanged. Is a left jugular central venous catheter with tip terminating the SVC. Stable appearance of the cardiomediastinal silhouette. Probable small left pleural. No pneumothorax. Suspect right middle lobe opacity, similar. IMPRESSION: Stable appearance of lines and tubes. Similar appearance of airspace disease in the right middle lobe. Small left pleural effusion. Electronically Signed   By: Albin Felling M.D.   On: 12/01/2020 08:24   DG CHEST PORT 1 VIEW  Result Date: 11/30/2020 CLINICAL DATA:  Hypoxia EXAM: PORTABLE CHEST 1 VIEW COMPARISON:  11/30/2020, 11/29/2020, 11/28/2020 FINDINGS: Endotracheal tube tip about 3.9 cm superior to carina. Esophageal tube tip overlies the stomach. Left IJ central venous catheter tip over  the venous confluence. Probable small left-sided pleural effusion. Increased left basilar airspace disease. Increasing hazy right base airspace disease. Stable cardiomediastinal silhouette IMPRESSION: 1. Similar appearance of support lines and tubes. 2. Small left-sided pleural effusion with increasing airspace disease at left base. Slight increased right infrahilar airspace disease. Electronically Signed   By: Donavan Foil M.D.   On: 11/30/2020 23:46   DG CHEST PORT 1 VIEW  Result Date: 11/30/2020 CLINICAL DATA:  Status post PICC line placement EXAM: PORTABLE CHEST 1 VIEW COMPARISON:  11/29/2020 FINDINGS: Left IJ venous catheter terminates in the upper SVC. Endotracheal tube terminates 4 cm above the carina. Enteric tube terminates  in the gastric cardia. No frank interstitial edema. Small bilateral pleural effusions, right greater than left. The heart is normal in size. Defibrillator pad overlying the left hemithorax. Cervical spine fixation hardware, incompletely visualized. IMPRESSION: Left IJ venous catheter terminates in the upper SVC. Otherwise unchanged. Electronically Signed   By: Julian Hy M.D.   On: 11/30/2020 02:14   ECHOCARDIOGRAM LIMITED  Result Date: 11/30/2020    ECHOCARDIOGRAM LIMITED REPORT   Patient Name:   AZIZ SLAPE Date of Exam: 11/30/2020 Medical Rec #:  [id number]       Height:       71.0 in Accession #:    [id number]      Weight:       198.2 lb Date of Birth:  1945/09/20       BSA:          2.100 m Patient Age:    36 years        BP:           106/54 mmHg Patient Gender: M               HR:           66 bpm. Exam Location:  [facility] Procedure: Limited Echo, Cardiac Doppler and Limited Color Doppler Indications:    eval LVEF  History:        Patient has prior history of Echocardiogram examinations. Risk                 Factors:Diabetes and Hypertension. Ventricular Tachycardia                 I47.2.  Sonographer:    Alvino Chapel RCS Referring Phys: [id number] [facility]   Sonographer Comments: Patient on mechanical ventilator at time of echo. IMPRESSIONS  1. Left ventricular ejection fraction, by estimation, is 65 to 70%. The left ventricle has normal function. The left ventricle has no regional wall motion abnormalities. There is mild left ventricular hypertrophy. Left ventricular diastolic parameters are indeterminate.  2. Right ventricular systolic function is normal. The right ventricular size is normal. Tricuspid regurgitation signal is inadequate for assessing PA pressure.  3. The mitral valve is grossly normal.  4. Aortic valve regurgitation is mild.  5. Aortic dilatation noted. There is mild dilatation of the aortic root, measuring 42 mm. Comparison(s): Prior images reviewed side by side. Limited study. LVEF remains normal range. FINDINGS  Left Ventricle: Left ventricular ejection fraction, by estimation, is 65 to 70%. The left ventricle has normal function. The left ventricle has no regional wall motion abnormalities. The left ventricular internal cavity size was normal in size. There is  mild left ventricular hypertrophy. Left ventricular diastolic parameters are indeterminate. Right Ventricle: The right ventricular size is normal. No increase in right ventricular wall thickness. Right ventricular systolic function is normal. Tricuspid regurgitation signal is inadequate for assessing PA pressure. Pericardium: Presence of pericardial fat pad. Mitral Valve: The mitral valve is grossly normal. There is mild calcification of the mitral valve leaflet(s). Aortic Valve: Aortic valve regurgitation is mild. Aorta: Aortic dilatation noted. There is mild dilatation of the aortic root, measuring 42 mm. Venous: IVC assessment for right atrial pressure unable to be performed due to mechanical ventilation. IAS/Shunts: No atrial level shunt detected by color flow Doppler. LEFT VENTRICLE PLAX 2D LVIDd:         4.40 cm LVIDs:         2.70 cm LV PW:  1.30 cm LV IVS:        1.30 cm LVOT  diam:     2.30 cm LVOT Area:     4.15 cm  LEFT ATRIUM         Index LA diam:    2.00 cm 0.95 cm/m   AORTA Ao Root diam: 4.20 cm MITRAL VALVE MV Area (PHT): 2.33 cm    SHUNTS MV Decel Time: 326 msec    Systemic Diam: 2.30 cm MV E velocity: 62.10 cm/s MV A velocity: 82.70 cm/s MV E/A ratio:  0.75 Rozann Lesches MD Electronically signed by Rozann Lesches MD Signature Date/Time: 11/30/2020/2:13:35 PM    Final     Telemetry    12/01/20 NSR with frequent PVCs, baseline artifact  - Personally Reviewed  ECG    No new tracing as of 12/01/20 - Personally Reviewed  Cardiac Studies   Echocardiogram 11/28/20:   1. Left ventricular ejection fraction, by estimation, is 55 to 60%. The  left ventricle has normal function. The left ventricle has no regional  wall motion abnormalities. There is moderate concentric left ventricular  hypertrophy. Left ventricular  diastolic parameters are consistent with Grade I diastolic dysfunction  (impaired relaxation).   2. Right ventricular systolic function is normal. The right ventricular  size is normal. Tricuspid regurgitation signal is inadequate for assessing  PA pressure.   3. The mitral valve is normal in structure. Trivial mitral valve  regurgitation. No evidence of mitral stenosis.   4. The aortic valve is tricuspid. Aortic valve regurgitation is mild.   5. Aortic dilatation noted. There is mild dilatation of the aortic root,  measuring 43 mm.   08/2017 echo Study Conclusions   - Left ventricle: Anbormal septal motion The cavity size was   normal. Wall thickness was increased in a pattern of moderate   LVH. Systolic function was normal. The estimated ejection   fraction was in the range of 55% to 60%. Wall motion was normal;   there were no regional wall motion abnormalities. Left   ventricular diastolic function parameters were normal. - Aortic valve: There was mild regurgitation. - Left atrium: The atrium was mildly dilated. - Atrial septum: No  defect or patent foramen ovale was identified.   08/2017 carotid US  IMPRESSION: 1. Mild (1-49%) stenosis proximal left internal carotid artery secondary to heterogenous atherosclerotic plaque. 2. No significant atherosclerotic plaque or stenosis in the right internal carotid artery. 3. The bilateral vertebral arteries are patent with normal antegrade flow.  Patient Profile     75 y.o. male with a hx of CVA 2019, NSVT, DM2, hyponatremia, hypertension, and CKD stage II who is being seen 11/30/2020 for the evaluation of reported cardiac arrest at the request of Dr.Emokpae.  Assessment & Plan    1. Cardiac arrest: -Pt transferred from [facility] yesterday after a bradycardic event with frequent NSVT and SVT prior to the event. Precipitant for event not clear but he has had altered mental status and was hypoxic. He was treated with atropine and epinephrine, CPR for 1 minute with ROSC. No shocks required. He was intubated and transferred to [facility] from [facility] on Levophed, vasopressin, amiodarone, and fentanyl. Plan at that time was to continue IV amiodarone for now with the expectation to discontinue in the next 24-48H.  -HsT, 263>>306>>285 -Limited echocardiogram repeated after event yesterday that showed stable LVEF at 65-70% with no regional wall motion abnormalities, mild LVH, mild AR and aortic dilatation measuring 66mm.  -Plan for ischemic evaluation once  stable from acute event  -Levoped has since been weaned off  -Continue IV Heparin for now  -Would plan to continue amiodarone today given electrolyte abnormalities.  -Keep K+ >4.0 and Mg+ >2.0  2. Acute respiratory failure: -In the setting of recent cardiac arrest -Currently weaning with hopeful plan to extubate today   3. CKD stage III: -Creatinine, 1.27 today  -Baseline appears to be in the 1.2 range   4. SBO: -Per abdominal CT>>surgery following  -NG in place -No indication for surgical intervention at this time   5. Hypokalemia: -K+,  3.4 today  -Replaced per CCM  6. DM2: -SSI   Signed, Kathyrn Drown NP-C [facility] Pager: [phone] 12/01/2020, 10:08 AM     For questions or updates, please contact   Please consult [email or web address] for contact info under Cardiology/STEMI.   Personally seen and examined. Agree with APP above with the following comments: Briefly 75 yo M s/p cardiac arrest.  Still having ectopy on amiodarone but without bradycardia.  AIVR/wide complexed from peri-arrest are concerning for ischemic etiology and warrant pre-discharge ischemic eval if feasible.  Patient is aware and alert but not fully to situation; he notes that he does not have an family he would like [place] to call.  Will plan to see early AM 12/02/20 for re-discussion of LHC.  NPO at midnight order placed for potential procedure 12/02/20.  Rudean Haskell, MD [place], [address] [place], [place] [address] [phone]  12:41 PM

## 2020-12-01 NOTE — Progress Notes (Addendum)
[place] Progress Note Patient Name: EZRA BOYAN DOB: 11-Mar-1946 MRN: [id number]   Date of Service  12/01/2020  HPI/Events of Note  K+ 3.4, corrected calcium 9.0, creatinine 1.27.  eICU Interventions  K+ replaced per E-Link replacement protocol.        Kerry Kass Leilanie Rauda 12/01/2020, 6:21 AM

## 2020-12-01 NOTE — Progress Notes (Signed)
ANTICOAGULATION CONSULT NOTE -   Pharmacy Consult for Heparin Indication: chest pain/ACS  No Known Allergies  Patient Measurements: Height: _0  (180.3 cm) Weight: 91.2 kg (201 lb 1 oz) IBW/kg (Calculated) : 75.3  Vital Signs: Temp: 98.2 F (36.8 C) (08/30 0719) Temp Source: Axillary (08/30 0719) BP: 107/63 (08/30 0645) Pulse Rate: 66 (08/30 0754)  Labs: Recent Labs    11/29/20 2243 11/30/20 0224 11/30/20 0225 11/30/20 0503 11/30/20 0730 11/30/20 9628 11/30/20 2231 12/01/20 0410 12/01/20 0521 12/01/20 0540 12/01/20 0844  HGB 14.7  --  13.6  --   --   --   --  11.9* 10.9*  --   --   HCT 44.5  --  41.8  --   --   --   --  35.8* 32.0*  --   --   PLT 415*  --  377  --   --   --   --  306  --   --   --   HEPARINUNFRC  --   --   --   --   --  0.15* <0.10*  --   --   --  <0.10*  CREATININE 1.19 1.30*  --   --   --   --   --  1.33*  --  1.27*  --   TROPONINIHS 116* 263*  --  306* 285*  --   --   --   --   --   --      Estimated Creatinine Clearance: 57.2 mL/min (A) (by C-G formula based on SCr of 1.27 mg/dL (H)).   Medical History: Past Medical History:  Diagnosis Date   Arthritis    BPH (benign prostatic hyperplasia)    CKD (chronic kidney disease), stage II    Complicated UTI (urinary tract infection) 03/2014   Depression    GERD (gastroesophageal reflux disease)    History of pneumonia    History of stroke    HOH (hard of hearing)    Hypertension    Hyponatremia    Lower GI bleed 2017   a. ? due to polyp.   NSVT (nonsustained ventricular tachycardia) (HCC)    Rheumatic fever    Childhood   Sleep apnea    Does not use CPAP   Type 2 diabetes mellitus (HCC)     Medications:  Scheduled:   bisacodyl  10 mg Rectal Once   bisacodyl  10 mg Rectal QHS   chlorhexidine gluconate (MEDLINE KIT)  15 mL Mouth Rinse BID   Chlorhexidine Gluconate Cloth  6 each Topical Daily   insulin aspart  0-9 Units Subcutaneous Q4H   mouth rinse  15 mL Mouth Rinse 10 times  per day   pantoprazole (PROTONIX) IV  40 mg Intravenous Q24H   sodium chloride flush  3 mL Intravenous Q12H   sodium chloride flush  3 mL Intravenous Q12H    Assessment: 75 y.o. male s/p cardiac arrest. Heparin started for possible ACS. Heparin level remains subtherapeutic at <0.1 after bolus and dose increase. Verified heparin drip is running at the correct rate. No reports of issues with the line or bleeding per nursing. Will repeat bolus and increase maintenance rate. CBC was within normal limits.  Goal of Therapy:  Heparin level 0.3-0.7 units/ml Monitor platelets by anticoagulation protocol: Yes   Plan:  Rebolus heparin 2500 units IV once Increase heparin infusion to 1800 units/hr. Check heparin level in 8 hours and daily Monitor H&H and s/s of bleeding.  Thank you for allowing pharmacy to participate in this patient's care.  Reatha Harps, PharmD PGY1 Pharmacy Resident 12/01/2020 10:40 AM Check [email or web address] for unit specific pharmacy number

## 2020-12-02 ENCOUNTER — Inpatient Hospital Stay (HOSPITAL_COMMUNITY): Payer: Medicare Other

## 2020-12-02 DIAGNOSIS — E871 Hypo-osmolality and hyponatremia: Secondary | ICD-10-CM | POA: Diagnosis not present

## 2020-12-02 DIAGNOSIS — J9601 Acute respiratory failure with hypoxia: Secondary | ICD-10-CM | POA: Diagnosis not present

## 2020-12-02 DIAGNOSIS — K56609 Unspecified intestinal obstruction, unspecified as to partial versus complete obstruction: Secondary | ICD-10-CM | POA: Diagnosis not present

## 2020-12-02 DIAGNOSIS — G9340 Encephalopathy, unspecified: Secondary | ICD-10-CM | POA: Diagnosis not present

## 2020-12-02 LAB — CBC
HCT: 36.2 % — ABNORMAL LOW (ref 39.0–52.0)
Hemoglobin: 11.9 g/dL — ABNORMAL LOW (ref 13.0–17.0)
MCH: 30.1 pg (ref 26.0–34.0)
MCHC: 32.9 g/dL (ref 30.0–36.0)
MCV: 91.6 fL (ref 80.0–100.0)
Platelets: 308 10*3/uL (ref 150–400)
RBC: 3.95 MIL/uL — ABNORMAL LOW (ref 4.22–5.81)
RDW: 13.9 % (ref 11.5–15.5)
WBC: 11.1 10*3/uL — ABNORMAL HIGH (ref 4.0–10.5)
nRBC: 0 % (ref 0.0–0.2)

## 2020-12-02 LAB — MAGNESIUM: Magnesium: 2.4 mg/dL (ref 1.7–2.4)

## 2020-12-02 LAB — BASIC METABOLIC PANEL
Anion gap: 13 (ref 5–15)
BUN: 16 mg/dL (ref 8–23)
CO2: 20 mmol/L — ABNORMAL LOW (ref 22–32)
Calcium: 7.7 mg/dL — ABNORMAL LOW (ref 8.9–10.3)
Chloride: 105 mmol/L (ref 98–111)
Creatinine, Ser: 1.27 mg/dL — ABNORMAL HIGH (ref 0.61–1.24)
GFR, Estimated: 59 mL/min — ABNORMAL LOW (ref >60)
Glucose, Bld: 131 mg/dL — ABNORMAL HIGH (ref 70–99)
Potassium: 3.5 mmol/L (ref 3.5–5.1)
Sodium: 138 mmol/L (ref 135–145)

## 2020-12-02 LAB — GLUCOSE, CAPILLARY
Glucose-Capillary: 144 mg/dL — ABNORMAL HIGH (ref 70–99)
Glucose-Capillary: 130 mg/dL — ABNORMAL HIGH (ref 70–99)
Glucose-Capillary: 151 mg/dL — ABNORMAL HIGH (ref 70–99)
Glucose-Capillary: 134 mg/dL — ABNORMAL HIGH (ref 70–99)
Glucose-Capillary: 122 mg/dL — ABNORMAL HIGH (ref 70–99)
Glucose-Capillary: 112 mg/dL — ABNORMAL HIGH (ref 70–99)

## 2020-12-02 LAB — PHOSPHORUS
Phosphorus: 1.3 mg/dL — ABNORMAL LOW (ref 2.5–4.6)
Phosphorus: 1.3 mg/dL — ABNORMAL LOW (ref 2.5–4.6)

## 2020-12-02 LAB — HEPARIN LEVEL (UNFRACTIONATED)
Heparin Unfractionated: 0.27 IU/mL — ABNORMAL LOW (ref 0.30–0.70)
Heparin Unfractionated: 0.34 IU/mL (ref 0.30–0.70)

## 2020-12-02 MED ORDER — POTASSIUM PHOSPHATES 15 MMOLE/5ML IV SOLN
45.0000 mmol | Freq: Once | INTRAVENOUS | Status: AC
  Administered 2020-12-02: 45 mmol via INTRAVENOUS
  Filled 2020-12-02: qty 15

## 2020-12-02 MED ORDER — AMIODARONE HCL IN DEXTROSE 360-4.14 MG/200ML-% IV SOLN
30.0000 mg/h | INTRAVENOUS | Status: DC
  Administered 2020-12-02 – 2020-12-04 (×5): 30 mg/h via INTRAVENOUS
  Filled 2020-12-02 (×4): qty 200

## 2020-12-02 MED ORDER — METOPROLOL TARTRATE 5 MG/5ML IV SOLN
5.0000 mg | Freq: Three times a day (TID) | INTRAVENOUS | Status: DC
  Administered 2020-12-02 – 2020-12-03 (×6): 5 mg via INTRAVENOUS
  Filled 2020-12-02 (×6): qty 5

## 2020-12-02 MED ORDER — AMLODIPINE BESYLATE 10 MG PO TABS: 10.0000 mg | ORAL_TABLET | Freq: Every day | ORAL | Status: DC

## 2020-12-02 MED ORDER — INSULIN ASPART 100 UNIT/ML IJ SOLN
0.0000 [IU] | Freq: Three times a day (TID) | INTRAMUSCULAR | Status: DC
  Administered 2020-12-02 – 2020-12-03 (×2): 2 [IU] via SUBCUTANEOUS

## 2020-12-02 MED ORDER — INSULIN ASPART 100 UNIT/ML IJ SOLN: 0.0000 [IU] | Freq: Every day | INTRAMUSCULAR | Status: DC

## 2020-12-02 MED ORDER — HYDRALAZINE HCL 20 MG/ML IJ SOLN
10.0000 mg | INTRAMUSCULAR | Status: AC | PRN
  Administered 2020-12-02 – 2020-12-03 (×3): 20 mg via INTRAVENOUS
  Administered 2020-12-04: 10 mg via INTRAVENOUS
  Administered 2020-12-04: 20 mg via INTRAVENOUS
  Filled 2020-12-02 (×5): qty 1

## 2020-12-02 MED ORDER — SODIUM CHLORIDE 0.9 % IV SOLN
1.5000 g | Freq: Four times a day (QID) | INTRAVENOUS | Status: AC
  Administered 2020-12-02 – 2020-12-04 (×10): 1.5 g via INTRAVENOUS
  Filled 2020-12-02 (×12): qty 4

## 2020-12-02 NOTE — Evaluation (Signed)
Clinical/Bedside Swallow Evaluation Patient Details  Name: Albert Garrison MRN: [id number] Date of Birth: 1946-02-18  Today's Date: 12/02/2020 Time: SLP Start Time (ACUTE ONLY): 1506 SLP Stop Time (ACUTE ONLY): [id number] SLP Time Calculation (min) (ACUTE ONLY): 9 min  Past Medical History:  Past Medical History:  Diagnosis Date   Arthritis    BPH (benign prostatic hyperplasia)    CKD (chronic kidney disease), stage II    Complicated UTI (urinary tract infection) 03/2014   Depression    GERD (gastroesophageal reflux disease)    History of pneumonia    History of stroke    HOH (hard of hearing)    Hypertension    Hyponatremia    Lower GI bleed 2017   a. ? due to polyp.   NSVT (nonsustained ventricular tachycardia) (HCC)    Rheumatic fever    Childhood   Sleep apnea    Does not use CPAP   Type 2 diabetes mellitus ([place])    Past Surgical History:  Past Surgical History:  Procedure Laterality Date   CARPAL TUNNEL RELEASE Left 2010   CARPAL TUNNEL RELEASE Right 07/13/2017   Procedure: CARPAL TUNNEL RELEASE;  Surgeon: Carole Civil, MD;  Location: [facility];  Service: Orthopedics;  Laterality: Right;   CERVICAL SPINE SURGERY  2010   COLONOSCOPY WITH PROPOFOL N/A 11/30/2015   Procedure: COLONOSCOPY WITH PROPOFOL;  Surgeon: Daneil Dolin, MD;  Location: [facility];  Service: Endoscopy;  Laterality: N/A;   KNEE ARTHROSCOPY  [place] surgery - broken nose     POLYPECTOMY  11/30/2015   Procedure: POLYPECTOMY;  Surgeon: Daneil Dolin, MD;  Location: [facility];  Service: Endoscopy;;  polyp at ascending colon, rectal polyp   TRANSURETHRAL INCISION OF PROSTATE N/A 01/20/2015   Procedure: TRANSURETHRAL INCISION OF THE PROSTATE (TUIP);  Surgeon: Irine Seal, MD;  Location: [facility];  Service: Urology;  Laterality: N/A;   HPI:  75 y.o. male presented to [facility] ED with diarrhea, decreased p.o. intake and nausea. Pt admitted with acute encephalopathy and  symptomatic hyponatremia. Abdominal distension 8/27. CT abd/pelvis (8/28) (+) SBO and NGT placed. 8/28 pt went into cardiac arrest, concern for possible STEMI. Transferred to [facility] for further treatment. CT head (8/28) revealed no acute intracranial abnormality. Chest xray (8/30) noted "appearance of airspace disease in the right middle lobe. Small left pleural effusion". Hosptial admission complicated by continued impulsivity and agitation with pt pulling out multiple NGT's despite restraints.  ETT: 8/28-8/30. PMHx significant for CKD, BPH, DMII, Hx of CVA, HTN, and GERD. SLE (08/23/2017) revealed cognitive communication deficits post CVA.   Assessment / Plan / Recommendation Clinical Impression  Pt seen for bedside swallow evaluation to determine possible diet advancement pending improvement in bowel function, per PA note (8/31). Pt alert and participative, but confused, having difficulty following commands for completion of thorough oral mechanism examination. Labial and lingual ROM/symmetry appeared [facility]. Congested cough noted at baseline prior to trials this date. Ice chips and thin liquids via cup and straw sips trialed only, per PA instruction, given NPO status. Ice chips were unremarkable for s/sx of aspiration, but with progression to consecutive straw sips, overt coughing noted, which did not improve with single small controlled cup sips. Impaired attention and confusion may also impact swallow function as pt required intermittent verbal cues to remain attentive to PO trials. Recommend NPO with ice chips after oral care if medically appropriate. SLP will continue to follow with  PO trials and probable instrumental assessment pending status of bowel function.  SLP Visit Diagnosis: Dysphagia, unspecified (R13.10)    Aspiration Risk  Moderate aspiration risk    Diet Recommendation NPO;Ice chips PRN after oral care   Liquid Administration via: Spoon Medication Administration: Via alternative  means Supervision: Full supervision/cueing for compensatory strategies Compensations: Slow rate;Minimize environmental distractions Postural Changes: Seated upright at 90 degrees    Other  Recommendations Oral Care Recommendations: Oral care QID;Staff/trained caregiver to provide oral care   Follow up Recommendations Other (comment) (TBD)      Frequency and Duration min 2x/week  2 weeks       Prognosis Prognosis for Safe Diet Advancement: Good Barriers to Reach Goals: Cognitive deficits      Swallow Study   General Date of Onset: 12/02/20 HPI: 75 y.o. male presented to [facility] ED with diarrhea, decreased p.o. intake and nausea. Pt admitted with acute encephalopathy and symptomatic hyponatremia. Abdominal distension 8/27. CT abd/pelvis (8/28) (+) SBO and NGT placed. 8/28 pt went into cardiac arrest, concern for possible STEMI. Transferred to [facility] for further treatment. CT head (8/28) revealed no acute intracranial abnormality. Chest xray (8/30) noted "appearance of airspace disease in the right middle lobe. Small left pleural effusion". Hosptial admission complicated by continued impulsivity and agitation with pt pulling out multiple NGT's despite restraints.  ETT: 8/28-8/30. PMHx significant for CKD, BPH, DMII, Hx of CVA, HTN, and GERD. SLE (08/23/2017) revealed cognitive communication deficits post CVA. Type of Study: Bedside Swallow Evaluation Previous Swallow Assessment: none  noted in EMR Diet Prior to this Study: NPO Temperature Spikes Noted: Yes (100.6) Respiratory Status: Nasal cannula History of Recent Intubation: Yes Length of Intubations (days): 2 days Date extubated: 12/01/20 Behavior/Cognition: Alert;Confused;Requires cueing Oral Cavity Assessment: Within Functional Limits Oral Care Completed by SLP: No Oral Cavity - Dentition: Poor condition;Missing dentition;Adequate natural dentition Self-Feeding Abilities: Needs assist Patient Positioning: Upright in bed;Postural control  adequate for testing Baseline Vocal Quality: Normal Volitional Cough: Strong;Congested    Oral/Motor/Sensory Function Overall Oral Motor/Sensory Function: Generalized oral weakness Facial ROM: Within Functional Limits Facial Symmetry: Within Functional Limits Lingual ROM: Within Functional Limits Lingual Symmetry: Within Functional Limits   Ice Chips Ice chips: Within functional limits Presentation: Spoon   Thin Liquid Thin Liquid: Impaired Presentation: Cup;Straw Pharyngeal  Phase Impairments: Cough - Immediate;Wet Vocal Quality    Nectar Thick Nectar Thick Liquid: Not tested   Honey Thick Honey Thick Liquid: Not tested   Puree Puree: Not tested   Solid     Solid: Not tested     Ellwood Dense, [place], [place] Office Number: [phone]  [name] 12/02/2020,3:58 PM

## 2020-12-02 NOTE — Progress Notes (Addendum)
Progress Note     Subjective: He complains of nausea but no emesis. He is passing flatus. No recent BM. A&Ox3 this am and he denies prior history of obstruction or abdominal surgery.   Objective: Vital signs in last 24 hours: Temp:  [98.2 F (36.8 C)-100.6 F (38.1 C)] 98.2 F (36.8 C) (08/31 0725) Pulse Rate:  [62-92] 87 (08/31 0645) Resp:  [18-37] 34 (08/31 0645) BP: (115-196)/(53-142) 154/79 (08/31 0645) SpO2:  [86 %-98 %] 92 % (08/31 0645) Weight:  [91.2 kg-91.5 kg] 91.5 kg (08/31 0500) Last BM Date: 11/30/20  Intake/Output from previous day: 08/30 0701 - 08/31 0700 In: 3392.2 [I.V.:3117.9; IV Piggyback:274.3] Out: 1725 [Urine:1725] Intake/Output this shift: No intake/output data recorded.  PE: General: pleasant, WD, male who is laying in bed in NAD HEENT: head is normocephalic, atraumatic. Mouth is pink and moist Heart: Palpable radial and pedal pulses bilaterally Lungs: mildly increased respiratory effort on supplemental O2 via [place] Abd: soft, NT. Mild to moderate distension. No hernias. No surgical scars visible. Bowel sounds present but hypoactive MSK: mild bilateral UE edema Skin: warm and dry with no masses, lesions, or rashes Psych: A&Ox3 with an appropriate affect.    Lab Results:  Recent Labs    12/01/20 0410 12/01/20 0521 12/02/20 0300  WBC 10.8*  --  11.1*  HGB 11.9* 10.9* 11.9*  HCT 35.8* 32.0* 36.2*  PLT 306  --  308   BMET Recent Labs    12/01/20 0540 12/02/20 0300  NA 133* 138  K 3.4* 3.5  CL 105 105  CO2 20* 20*  GLUCOSE 199* 131*  BUN 23 16  CREATININE 1.27* 1.27*  CALCIUM 7.3* 7.7*   PT/INR No results for input(s): LABPROT, INR in the last 72 hours. CMP     Component Value Date/Time   NA 138 12/02/2020 0300   K 3.5 12/02/2020 0300   CL 105 12/02/2020 0300   CO2 20 (L) 12/02/2020 0300   GLUCOSE 131 (H) 12/02/2020 0300   BUN 16 12/02/2020 0300   CREATININE 1.27 (H) 12/02/2020 0300   CALCIUM 7.7 (L) 12/02/2020 0300    CALCIUM 8.2 (L) 03/26/2014 0556   PROT 4.1 (L) 12/01/2020 0540   ALBUMIN 1.9 (L) 12/01/2020 0540   AST 21 12/01/2020 0540   ALT 17 12/01/2020 0540   ALKPHOS 33 (L) 12/01/2020 0540   BILITOT 1.0 12/01/2020 0540   GFRNONAA 59 (L) 12/02/2020 0300   GFRAA 41 (L) 08/28/2018 1217   Lipase     Component Value Date/Time   LIPASE 19 11/27/2020 0943       Studies/Results: DG Abd 1 View  Result Date: 11/30/2020 CLINICAL DATA:  Bowel obstruction EXAM: ABDOMEN - 1 VIEW COMPARISON:  CT 11/29/2020 FINDINGS: Esophageal tube tip looped over the stomach. Persistent dilatation of small bowel measuring up to 5 cm consistent with bowel obstruction. IMPRESSION: Persistent dilatation of small bowel consistent with obstruction. Electronically Signed   By: Donavan Foil M.D.   On: 11/30/2020 23:44   DG CHEST PORT 1 VIEW  Result Date: 12/01/2020 CLINICAL DATA:  Respiratory failure EXAM: PORTABLE CHEST 1 VIEW COMPARISON:  Previous day FINDINGS: Stable appearance of endotracheal tube midthoracic trachea. Cervical hardware partially visualized, grossly unchanged. Is a left jugular central venous catheter with tip terminating the SVC. Stable appearance of the cardiomediastinal silhouette. Probable small left pleural. No pneumothorax. Suspect right middle lobe opacity, similar. IMPRESSION: Stable appearance of lines and tubes. Similar appearance of airspace disease in the right middle lobe. Small  left pleural effusion. Electronically Signed   By: Albin Felling M.D.   On: 12/01/2020 08:24   DG CHEST PORT 1 VIEW  Result Date: 11/30/2020 CLINICAL DATA:  Hypoxia EXAM: PORTABLE CHEST 1 VIEW COMPARISON:  11/30/2020, 11/29/2020, 11/28/2020 FINDINGS: Endotracheal tube tip about 3.9 cm superior to carina. Esophageal tube tip overlies the stomach. Left IJ central venous catheter tip over the venous confluence. Probable small left-sided pleural effusion. Increased left basilar airspace disease. Increasing hazy right base  airspace disease. Stable cardiomediastinal silhouette IMPRESSION: 1. Similar appearance of support lines and tubes. 2. Small left-sided pleural effusion with increasing airspace disease at left base. Slight increased right infrahilar airspace disease. Electronically Signed   By: Donavan Foil M.D.   On: 11/30/2020 23:46   ECHOCARDIOGRAM LIMITED  Result Date: 11/30/2020    ECHOCARDIOGRAM LIMITED REPORT   Patient Name:   Albert Garrison Date of Exam: 11/30/2020 Medical Rec #:  [id number]       Height:       71.0 in Accession #:    [id number]      Weight:       198.2 lb Date of Birth:  05-Feb-1946       BSA:          2.100 m Patient Age:    75 years        BP:           106/54 mmHg Patient Gender: M               HR:           66 bpm. Exam Location:  [facility] Procedure: Limited Echo, Cardiac Doppler and Limited Color Doppler Indications:    eval LVEF  History:        Patient has prior history of Echocardiogram examinations. Risk                 Factors:Diabetes and Hypertension. Ventricular Tachycardia                 I47.2.  Sonographer:    Alvino Chapel RCS Referring Phys: [id number] [facility]  Sonographer Comments: Patient on mechanical ventilator at time of echo. IMPRESSIONS  1. Left ventricular ejection fraction, by estimation, is 65 to 70%. The left ventricle has normal function. The left ventricle has no regional wall motion abnormalities. There is mild left ventricular hypertrophy. Left ventricular diastolic parameters are indeterminate.  2. Right ventricular systolic function is normal. The right ventricular size is normal. Tricuspid regurgitation signal is inadequate for assessing PA pressure.  3. The mitral valve is grossly normal.  4. Aortic valve regurgitation is mild.  5. Aortic dilatation noted. There is mild dilatation of the aortic root, measuring 42 mm. Comparison(s): Prior images reviewed side by side. Limited study. LVEF remains normal range. FINDINGS  Left Ventricle: Left ventricular ejection  fraction, by estimation, is 65 to 70%. The left ventricle has normal function. The left ventricle has no regional wall motion abnormalities. The left ventricular internal cavity size was normal in size. There is  mild left ventricular hypertrophy. Left ventricular diastolic parameters are indeterminate. Right Ventricle: The right ventricular size is normal. No increase in right ventricular wall thickness. Right ventricular systolic function is normal. Tricuspid regurgitation signal is inadequate for assessing PA pressure. Pericardium: Presence of pericardial fat pad. Mitral Valve: The mitral valve is grossly normal. There is mild calcification of the mitral valve leaflet(s). Aortic Valve: Aortic valve regurgitation is mild. Aorta: Aortic dilatation noted.  There is mild dilatation of the aortic root, measuring 42 mm. Venous: IVC assessment for right atrial pressure unable to be performed due to mechanical ventilation. IAS/Shunts: No atrial level shunt detected by color flow Doppler. LEFT VENTRICLE PLAX 2D LVIDd:         4.40 cm LVIDs:         2.70 cm LV PW:         1.30 cm LV IVS:        1.30 cm LVOT diam:     2.30 cm LVOT Area:     4.15 cm  LEFT ATRIUM         Index LA diam:    2.00 cm 0.95 cm/m   AORTA Ao Root diam: 4.20 cm MITRAL VALVE MV Area (PHT): 2.33 cm    SHUNTS MV Decel Time: 326 msec    Systemic Diam: 2.30 cm MV E velocity: 62.10 cm/s MV A velocity: 82.70 cm/s MV E/A ratio:  0.75 Rozann Lesches MD Electronically signed by Rozann Lesches MD Signature Date/Time: 11/30/2020/2:13:35 PM    Final     Anti-infectives: Anti-infectives (From admission, onward)    Start     Dose/Rate Route Frequency Ordered Stop   12/01/20 1630  ampicillin-sulbactam (UNASYN) 1.5 g in sodium chloride 0.9 % 100 mL IVPB  Status:  Discontinued        1.5 g 200 mL/hr over 30 Minutes Intravenous Every 6 hours 12/01/20 1531 12/01/20 1534   12/01/20 1630  ampicillin-sulbactam (UNASYN) 1.5 g in sodium chloride 0.9 % 100 mL IVPB         1.5 g 200 mL/hr over 30 Minutes Intravenous Every 8 hours 12/01/20 1534     11/30/20 1000  vancomycin (VANCOREADY) IVPB 750 mg/150 mL  Status:  Discontinued        750 mg 150 mL/hr over 60 Minutes Intravenous Every 12 hours 11/30/20 0043 12/01/20 0937   11/30/20 0130  vancomycin (VANCOREADY) IVPB 1500 mg/300 mL        1,500 mg 150 mL/hr over 120 Minutes Intravenous  Once 11/30/20 0043 11/30/20 0511   11/30/20 0130  ceFEPIme (MAXIPIME) 2 g in sodium chloride 0.9 % 100 mL IVPB  Status:  Discontinued        2 g 200 mL/hr over 30 Minutes Intravenous Every 12 hours 11/30/20 0043 12/01/20 [id number]        Assessment/Plan Likely SBO - OGT out yesterday with extubation. 150 ml out prior to that. 700 ml out day prior - given Bm 2 days ago and no emesis, will defer further NG placement attempts for now and repeat abd xray - consider placement of NG with radiology assistance pending xray and if worsening N/V. Patient has history of nose surgery - repeat abd xray today - Remain NPO for now  Addendum: have order SLP for swallow eval in preparation for possible diet advancement should bowel function continue to improve  FEN: NPO ID: [email or web address] VTE: heparin  Bradycardic arrest Acute hypoxic respiratory failure Aspiration pneumonia AKI  DM2    LOS: 4 days    Winferd Humphrey, [facility] 12/02/2020, 8:46 AM Please see Amion for pager number during day hours 7:00am-4:30pm

## 2020-12-02 NOTE — Evaluation (Signed)
Physical Therapy Evaluation Patient Details Name: Albert Garrison MRN: [id number] DOB: 10/05/1945 Today's Date: 12/02/2020   History of Present Illness  75 y.o. male presenting to [facility] ED 8/29 with diarrhea, decreased p.o. intake and nausea. Patient admitted with acute encephalopathy and symptomatic hyponatremia. Abdominal distension 8/27. CT abd (+) SBO and NGT placed. Hosptial admission complicated by continued impulsivity and agitation with patient pulling out multiple NGT's despite restraints. 8/28 wide QRS with frequent consecutive PVCs and LBBB. Bradycardic and hypotensive followed by loss of pulse. Code blue called. ROSC achieved after several mins of CPR. Troponin peaked at 309. Concern for possible STEMI. Transferred to [facility] for further treatment. PRVC 8/28-8/30. Cardiology following. PMHx significant for CKD, BPH, DMII, Hx of CVA, HTN, and GERD.  Clinical Impression  Pt admitted with/for acute encephalopathy and symptomatic hyponatremia.  Pt presently needing moderate assist for mobility and still not back to baseline mentation..  Pt currently limited functionally due to the problems listed. ( See problems list.)   Pt will benefit from PT to maximize function and safety in order to get ready for next venue listed below.     Follow Up Recommendations CIR;Supervision/Assistance - 24 hour    Equipment Recommendations  Other (comment) (TBA)    Recommendations for Other Services Rehab consult     Precautions / Restrictions Precautions Precautions: Fall Precaution Comments: Monitor BP Restrictions Weight Bearing Restrictions: No      Mobility  Bed Mobility Overal bed mobility: Needs Assistance Bed Mobility: Supine to Sit     Supine to sit: Mod assist     General bed mobility comments: up in the chair on arrival    Transfers Overall transfer level: Needs assistance Equipment used: 1 person hand held assist Transfers: Sit to/from Stand;Stand Pivot Transfers Sit to Stand:  Mod assist Stand pivot transfers: Mod assist       General transfer comment: cues for direction/hand placement, redirection.  assist forward and boost.  stability with transfer.  Ambulation/Gait             General Gait Details: march in place stability assisted.  Stairs            Wheelchair Mobility    Modified Rankin (Stroke Patients Only)       Balance Overall balance assessment: Needs assistance Sitting-balance support: Single extremity supported;Bilateral upper extremity supported;Feet supported;Feet unsupported;No upper extremity supported Sitting balance-Leahy Scale: Fair Sitting balance - Comments: ability to accept minimal challenge perched Edge of chair. Postural control: Posterior lean Standing balance support: Bilateral upper extremity supported;During functional activity Standing balance-Leahy Scale: Poor Standing balance comment: Reliant on external assist.                             Pertinent Vitals/Pain Pain Assessment: Faces Faces Pain Scale: Hurts a little bit Pain Location: back Pain Descriptors / Indicators: Aching;Discomfort Pain Intervention(s): Monitored during session    Home Living Family/patient expects to be discharged to:: Private residence Living Arrangements: Alone Available Help at Discharge: Family Type of Home: Apartment Home Access: Level entry (1st floor)     Home Layout: One level Home Equipment: None      Prior Function Level of Independence: Independent         Comments: I with ADLs/IADLs without AD; drives; enjoys classic cars and going to church     Hand Dominance   Dominant Hand: Right    Extremity/Trunk Assessment   Upper Extremity Assessment Upper Extremity  Assessment: Generalized weakness (R>L)    Lower Extremity Assessment Lower Extremity Assessment: Overall WFL for tasks assessed;Generalized weakness    Cervical / Trunk Assessment Cervical / Trunk Assessment: Normal   Communication   Communication: No difficulties  Cognition Arousal/Alertness: Awake/alert Behavior During Therapy: Flat affect Overall Cognitive Status: Impaired/Different from baseline Area of Impairment: Orientation;Attention;Following commands;Safety/judgement;Awareness;Problem solving                 Orientation Level: Disoriented to;Time;Situation;Place Current Attention Level: Sustained   Following Commands: Follows one step commands consistently;Follows one step commands with increased time Safety/Judgement: Decreased awareness of safety;Decreased awareness of deficits Awareness: Emergent Problem Solving: Slow processing General Comments: Patient requires cues for safety/sequencing; mildly impulsive      General Comments General comments (skin integrity, edema, etc.): BP in the 150's over 80's, sats mid 90's on RA    Exercises     Assessment/Plan    PT Assessment Patient needs continued PT services  PT Problem List Decreased strength;Decreased activity tolerance;Decreased balance;Decreased mobility;Cardiopulmonary status limiting activity;Decreased knowledge of use of DME       PT Treatment Interventions Gait training;Stair training;Functional mobility training;Therapeutic activities;Balance training;Patient/family education    PT Goals (Current goals can be found in the Care Plan section)  Acute Rehab PT Goals Patient Stated Goal: To get better. PT Goal Formulation: With patient Time For Goal Achievement: 12/16/20 Potential to Achieve Goals: Good    Frequency Min 3X/week   Barriers to discharge        Co-evaluation               AM-PAC PT "6 Clicks" Mobility  Outcome Measure Help needed turning from your back to your side while in a flat bed without using bedrails?: A Lot Help needed moving from lying on your back to sitting on the side of a flat bed without using bedrails?: A Lot Help needed moving to and from a bed to a chair (including a  wheelchair)?: A Lot Help needed standing up from a chair using your arms (e.g., wheelchair or bedside chair)?: A Lot Help needed to walk in hospital room?: A Lot Help needed climbing 3-5 steps with a railing? : A Lot 6 Click Score: 12    End of Session   Activity Tolerance: Patient tolerated treatment well Patient left: in chair Nurse Communication: Mobility status PT Visit Diagnosis: Unsteadiness on feet (R26.81);Other abnormalities of gait and mobility (R26.89);Muscle weakness (generalized) (M62.81);Difficulty in walking, not elsewhere classified (R26.2)    Time: [id number] PT Time Calculation (min) (ACUTE ONLY): 21 min   Charges:   PT Evaluation $PT Eval Moderate Complexity: 1 Mod          12/02/2020  Ginger Carne., PT Acute Rehabilitation Services [phone]  (pager) [phone]  (office)  Tessie Fass Chibueze Beasley 12/02/2020, 2:34 PM

## 2020-12-02 NOTE — Progress Notes (Addendum)
[facility] Progress Note Patient Name: Albert Garrison DOB: Aug 13, 1945 MRN: [id number]   Date of Service  12/02/2020  HPI/Events of Note  Patient with sub-optimal blood pressure control, his home Amlodipine was not continued here in the hospital.  eICU Interventions  Amlodipine 10 mg po daily ordered (first dose now), PRN iv Hydralazine also ordered. Electrolyte results are pending.        Kerry Kass Dermot Gremillion 12/02/2020, 2:54 AM

## 2020-12-02 NOTE — Progress Notes (Addendum)
NAME:  Albert Garrison, MRN:  [id number], DOB:  1946/02/04, LOS: 4 ADMISSION DATE:  11/27/2020, CONSULTATION DATE:  8/29 REFERRING MD:  Dr. Denton Brick, CHIEF COMPLAINT:  Diarrhea   History of Present Illness:  75 y/o M who presented to [facility] on 8/26 with reports of diarrhea.   Per chart, he reported diarrhea for several days with associated nausea, poor intake and decreased appetite.  Reportedly has had 4 COVID vaccines.  Initial work up notable for hyponatremia with initial Na+ of 118, AKI, glucosuria (>500 with 20 ketones), WBC 14.3. RVP was negative.   He was altered on admission thought secondary to symptomatic hyponatremia.  He developed nausea with vomiting on 8/26 requiring zofran.  He did have a BM that day (documented as [place] 1).  There were concerns for abdominal distention & XRAY was ordered 8/27 which showed multiple dilated small bowel loops concerning for distal SBO.  He was seen by surgery and recommended for conservative therapy.  NGT was placed and patient was made NPO. Subsequent CT of the Abd/Pelvis showed small bowel obstruction with likely transition point in the right mid abdomen, diverticulosis without acute diverticulitis, trace bilateral pleural effusions. ECHO completed 8/27 which showed grade I diastolic dysfunction, normal EF. Per notes, he became more agitated / pulling NGT multiple times 8/28 overnight.  He was started on precedex per [facility].  He was documented as having more frequent ectopy - VT during the day 8/28. Late PM 8/28, he was noted to have a rhythm change with PVC's, LBBB.  He became unresponsive with agonal respirations. CODE BLUE was called, ACLS initiated.  Patient was intubated.  ROSC was achieved after approximately 5 minutes resuscitation efforts. There was concerns for possible STEMI and he was started on Heparin infusion & amiodarone. Cardiology was consulted. Troponin peaked at 306.  Per notes, he was felt to need non-emergent cardiac evaluation once recovered  from acute illness.     Pertinent  Medical History  CKD II Hyponatremia - prior admit in 2015  Depression  CVA HOH HTN NSVT  OSA - does not use CPAP  Former Smoker - quit 1977, 21 pack years  Former ETOH - quit 2015  DM II  GERD  Lower GIB - thought secondary to polyp  Rheumatic Fever - childhood Heart Murmur   Significant Hospital Events: Including procedures, antibiotic start and stop dates in addition to other pertinent events   8/26 Admit 8/26 N/V 8/27 KUB with concern for distal SBO. ECHO with LVEF 55-60%, no RWMA, grade I diastolic dysfunction [id number] CT ABD with small bowel obstruction with likely transition point in the right mid abdomen.  CT Head negative.  8/28 cardiac arrest >>NSVT with somewhat more sustained runs, progressive IVCD by ECG, reportedly bradycardia and unresponsiveness.  He was treated with atropine and epinephrine, had compressions under a minute with ROSC, no shocks delivered.  He was intubated and did require initiation of pressors including Levophed and vasopressin, also started on amiodarone 8/29 Transfer to [facility] for surgical / cardiac ongoing evaluation 8/29 Echo LVEF 65-70%, otherwise grossly normal.  8/30 extubated   Interim History / Subjective:  Extubated yesterday. Some hypoxia post extubation, which resolved with [place].  Nursing staff unable to replace NGT despite multiple attempts.   Objective   Blood pressure (!) 154/79, pulse 87, temperature 98.2 F (36.8 C), temperature source Oral, resp. rate (!) 34, height '5\' 11"'$  (1.803 m), weight 91.5 kg, SpO2 92 %.        Intake/Output Summary (  Last 24 hours) at 12/02/2020 0804 Last data filed at 12/02/2020 0600 Gross per 24 hour  Intake 3265.21 ml  Output 1725 ml  Net 1540.21 ml    Filed Weights   12/01/20 0434 12/01/20 1000 12/02/20 0500  Weight: 91.2 kg 91.2 kg 91.5 kg    Examination:  General:  elderly appearing male in NAD Neuro:  Awake, alert, answers orientation questions  appropriately.  HEENT:  [place]/AT, PERRL, no JVD Cardiovascular:  RRR, no MRG. Intermittent PVC on tele Lungs:  Clear Abdomen:  Soft, some distension, hypoactive bowel sounds.  Musculoskeletal:  No acute deformity or ROM limitation  Skin:  intact MMM   Resolved Hospital Problem list   Circulatory shock Acute resp failure hypoxic   Assessment & Plan:   NSVT  Bradycardic Arrest  Multiple electrolyte disturbances.vs precedex related. Following commands post arrest.  -Back in NSR now, frequent PVC. Cards recommends continuing amiodarone for now.   -LHC pending, likely 9/1. -Heparin infusion for now  Nausea / Vomiting with SBO: Concern for transition point on CT abd/pelvis  - Surgery following - OGT pulled when extubated, RN staff unable to place NGT despite multiple attempts.  - No indication for surgical intervention.   Fever: no obvious source. ? Aspiration during arrest. Cultures pending. Very mild leukocytosis.  - Unasyn to cover aspiration  Symptomatic Hyponatremia  -Continue NS, decrease to [facility] as he conintues to be positive. Trend NA and Cl  AKI  -Nonoliguric  -Monitor lytes daily for  DM II  Poorly controlled, admit A1c 8.6.   -SSI  -hold home metformin   HTN - BP creeping up overnight. On amlodipine and telmisartan at home - Currently NPO, will schedule metoprolol IV until he can take orals.   Will plan to transfer patient to floor/[facility]H for 9/1  Best Practice (right click and "Reselect all SmartList Selections" daily)   Diet/type: NPO DVT prophylaxis: prophylactic heparin  GI prophylaxis: PPI Lines: Central line and yes and it is still needed Foley:  Yes, and it is still needed Code Status:  full code Last date of multidisciplinary goals of care discussion [NA]   Georgann Housekeeper, AGACNP-BC [place] for personal pager PCCM on call pager [phone] until 7pm. Please call Elink 7p-7a. [phone]  12/02/2020 8:04  AM   '

## 2020-12-02 NOTE — Progress Notes (Signed)
ANTICOAGULATION CONSULT NOTE   Pharmacy Consult for Heparin Indication: chest pain/ACS  No Known Allergies  Patient Measurements: Height: 5\' 11"  (180.3 cm) Weight: 91.5 kg (201 lb 11.5 oz) IBW/kg (Calculated) : 75.3  Vital Signs: Temp: 98.4 F (36.9 C) (08/31 0404) Temp Source: Oral (08/31 0404) BP: 160/71 (08/31 0315) Pulse Rate: 85 (08/31 0315)  Labs: Recent Labs    11/30/20 0224 11/30/20 0225 11/30/20 0503 11/30/20 0730 11/30/20 7408 12/01/20 0410 12/01/20 0521 12/01/20 0540 12/01/20 0844 12/01/20 1941 12/02/20 0300 12/02/20 0536  HGB  --  13.6  --   --   --  11.9* 10.9*  --   --   --  11.9*  --   HCT  --  41.8  --   --   --  35.8* 32.0*  --   --   --  36.2*  --   PLT  --  377  --   --   --  306  --   --   --   --  308  --   HEPARINUNFRC  --   --   --   --    < >  --   --   --  <0.10* 0.15*  --  0.27*  CREATININE 1.30*  --   --   --   --  1.33*  --  1.27*  --   --  1.27*  --   TROPONINIHS 263*  --  306* 285*  --   --   --   --   --   --   --   --    < > = values in this interval not displayed.     Estimated Creatinine Clearance: 57.3 mL/min (A) (by C-G formula based on SCr of 1.27 mg/dL (H)).   Medical History: Past Medical History:  Diagnosis Date   Arthritis    BPH (benign prostatic hyperplasia)    CKD (chronic kidney disease), stage II    Complicated UTI (urinary tract infection) 03/2014   Depression    GERD (gastroesophageal reflux disease)    History of pneumonia    History of stroke    HOH (hard of hearing)    Hypertension    Hyponatremia    Lower GI bleed 2017   a. ? due to polyp.   NSVT (nonsustained ventricular tachycardia) (HCC)    Rheumatic fever    Childhood   Sleep apnea    Does not use CPAP   Type 2 diabetes mellitus (HCC)     Medications:  Scheduled:   amLODipine  10 mg Per Tube Daily   bisacodyl  10 mg Rectal Once   bisacodyl  10 mg Rectal QHS   chlorhexidine gluconate (MEDLINE KIT)  15 mL Mouth Rinse BID   Chlorhexidine  Gluconate Cloth  6 each Topical Daily   insulin aspart  0-9 Units Subcutaneous Q4H   mouth rinse  15 mL Mouth Rinse 10 times per day   sodium chloride flush  3 mL Intravenous Q12H   sodium chloride flush  3 mL Intravenous Q12H    Assessment: 75 y.o. male s/p cardiac arrest. Heparin started for possible ACS. Heparin level remains subtherapeutic at <0.1 after bolus and dose increase. Verified heparin drip is running at the correct rate. No reports of issues with the line or bleeding per nursing. Will repeat bolus and increase maintenance rate. CBC was within normal limits.  8/31 AM update:  Heparin level just below goal CBC stable  Goal  of Therapy:  Heparin level 0.3-0.7 units/ml Monitor platelets by anticoagulation protocol: Yes   Plan:  Inc heparin to 2200 units/hr 1400 heparin level  Narda Bonds, PharmD, BCPS Clinical Pharmacist Phone: [phone]

## 2020-12-02 NOTE — Progress Notes (Addendum)
Progress Note  Patient Name: Albert Garrison Date of Encounter: 12/02/2020  Primary Cardiologist: Carlyle Dolly, MD       Subjective   Extubated today. Remains fairly altered. Confused overnight. No specific complaints on exam   Inpatient Medications    Scheduled Meds:  amLODipine  10 mg Per Tube Daily   bisacodyl  10 mg Rectal Once   bisacodyl  10 mg Rectal QHS   chlorhexidine gluconate (MEDLINE KIT)  15 mL Mouth Rinse BID   Chlorhexidine Gluconate Cloth  6 each Topical Daily   insulin aspart  0-9 Units Subcutaneous Q4H   mouth rinse  15 mL Mouth Rinse 10 times per day   sodium chloride flush  3 mL Intravenous Q12H   sodium chloride flush  3 mL Intravenous Q12H   Continuous Infusions:  sodium chloride 100 mL/hr at 12/02/20 1245   sodium chloride     sodium chloride Stopped (11/30/20 0009)   amiodarone 30 mg/hr (12/02/20 0600)   ampicillin-sulbactam (UNASYN) IV Stopped (12/02/20 0014)   heparin 2,200 Units/hr (12/02/20 0629)   potassium PHOSPHATE IVPB (in mmol) 86 mL/hr at 12/02/20 0600   PRN Meds: sodium chloride, acetaminophen **OR** acetaminophen, bisacodyl, hydrALAZINE, ondansetron **OR** ondansetron (ZOFRAN) IV, sodium chloride flush   Vital Signs    Vitals:   12/02/20 0615 12/02/20 0630 12/02/20 0645 12/02/20 0725  BP: (!) 157/77 (!) 196/99 (!) 154/79   Pulse: 76 85 87   Resp: (!) 30 (!) 31 (!) 34   Temp:    98.2 F (36.8 C)  TempSrc:    Oral  SpO2: 94% 90% 92%   Weight:      Height:        Intake/Output Summary (Last 24 hours) at 12/02/2020 0751 Last data filed at 12/02/2020 0600 Gross per 24 hour  Intake 3392.18 ml  Output 1725 ml  Net 1667.18 ml   Filed Weights   12/01/20 0434 12/01/20 1000 12/02/20 0500  Weight: 91.2 kg 91.2 kg 91.5 kg    Physical Exam   General: Well developed, well nourished, NAD Neck: Negative for carotid bruits. No JVD Lungs:Clear to ausculation bilaterally. Breathing is unlabored. Cardiovascular: RRR with S1  S2. No murmurs Abdomen: Soft, non-tender, non-distended. No obvious abdominal masses. Extremities: No edema. Radial pulses 2+ bilaterally Neuro: Alert and oriented to person and place. No focal deficits. No facial asymmetry. MAE spontaneously. Psych: Responds to questions somewhat appropriately with normal affect.    Labs    Chemistry Recent Labs  Lab 11/27/20 [phone] 11/27/20 1250 11/30/20 0224 12/01/20 0410 12/01/20 0521 12/01/20 0540 12/02/20 0300  NA 118*   < > 130* 133* 136 133* 138  K 4.0   < > 4.5 3.6 3.5 3.4* 3.5  CL 88*   < > 100 105  --  105 105  CO2 21*   < > 21* 19*  --  20* 20*  GLUCOSE 266*   < > 242* 209*  --  199* 131*  BUN 20   < > 22 22  --  23 16  CREATININE 1.27*   < > 1.30* 1.33*  --  1.27* 1.27*  CALCIUM 8.7*   < > 7.3* 7.4*  --  7.3* 7.7*  PROT 7.0  --  5.6*  --   --  4.1*  --   ALBUMIN 3.6   < > 2.7* 1.9*  --  1.9*  --   AST 19  --  32  --   --  21  --   ALT 16  --  21  --   --  17  --   ALKPHOS 51  --  44  --   --  33*  --   BILITOT 1.8*  --  1.4*  --   --  1.0  --   GFRNONAA 59*   < > 57* 55*  --  59* 59*  ANIONGAP 9   < > 9 9  --  8 13   < > = values in this interval not displayed.     Hematology Recent Labs  Lab 11/30/20 0225 12/01/20 0410 12/01/20 0521 12/02/20 0300  WBC 12.9* 10.8*  --  11.1*  RBC 4.46 3.93*  --  3.95*  HGB 13.6 11.9* 10.9* 11.9*  HCT 41.8 35.8* 32.0* 36.2*  MCV 93.7 91.1  --  91.6  MCH 30.5 30.3  --  30.1  MCHC 32.5 33.2  --  32.9  RDW 13.3 13.7  --  13.9  PLT 377 306  --  308    Cardiac EnzymesNo results for input(s): TROPONINI in the last 168 hours. No results for input(s): TROPIPOC in the last 168 hours.   BNPNo results for input(s): BNP, PROBNP in the last 168 hours.   DDimer No results for input(s): DDIMER in the last 168 hours.   Radiology    DG Abd 1 View  Result Date: 11/30/2020 CLINICAL DATA:  Bowel obstruction EXAM: ABDOMEN - 1 VIEW COMPARISON:  CT 11/29/2020 FINDINGS: Esophageal tube tip looped  over the stomach. Persistent dilatation of small bowel measuring up to 5 cm consistent with bowel obstruction. IMPRESSION: Persistent dilatation of small bowel consistent with obstruction. Electronically Signed   By: Donavan Foil M.D.   On: 11/30/2020 23:44   DG CHEST PORT 1 VIEW  Result Date: 12/01/2020 CLINICAL DATA:  Respiratory failure EXAM: PORTABLE CHEST 1 VIEW COMPARISON:  Previous day FINDINGS: Stable appearance of endotracheal tube midthoracic trachea. Cervical hardware partially visualized, grossly unchanged. Is a left jugular central venous catheter with tip terminating the SVC. Stable appearance of the cardiomediastinal silhouette. Probable small left pleural. No pneumothorax. Suspect right middle lobe opacity, similar. IMPRESSION: Stable appearance of lines and tubes. Similar appearance of airspace disease in the right middle lobe. Small left pleural effusion. Electronically Signed   By: Albin Felling M.D.   On: 12/01/2020 08:24   DG CHEST PORT 1 VIEW  Result Date: 11/30/2020 CLINICAL DATA:  Hypoxia EXAM: PORTABLE CHEST 1 VIEW COMPARISON:  11/30/2020, 11/29/2020, 11/28/2020 FINDINGS: Endotracheal tube tip about 3.9 cm superior to carina. Esophageal tube tip overlies the stomach. Left IJ central venous catheter tip over the venous confluence. Probable small left-sided pleural effusion. Increased left basilar airspace disease. Increasing hazy right base airspace disease. Stable cardiomediastinal silhouette IMPRESSION: 1. Similar appearance of support lines and tubes. 2. Small left-sided pleural effusion with increasing airspace disease at left base. Slight increased right infrahilar airspace disease. Electronically Signed   By: Donavan Foil M.D.   On: 11/30/2020 23:46   ECHOCARDIOGRAM LIMITED  Result Date: 11/30/2020    ECHOCARDIOGRAM LIMITED REPORT   Patient Name:   Albert Garrison Date of Exam: 11/30/2020 Medical Rec #:  [id number]       Height:       71.0 in Accession #:    [id number]       Weight:       198.2 lb Date of Birth:  Jun 05, 1945       BSA:  2.100 m Patient Age:    74 years        BP:           106/54 mmHg Patient Gender: M               HR:           66 bpm. Exam Location:  [facility] Procedure: Limited Echo, Cardiac Doppler and Limited Color Doppler Indications:    eval LVEF  History:        Patient has prior history of Echocardiogram examinations. Risk                 Factors:Diabetes and Hypertension. Ventricular Tachycardia                 I47.2.  Sonographer:    Alvino Chapel RCS Referring Phys: [id number] [facility]  Sonographer Comments: Patient on mechanical ventilator at time of echo. IMPRESSIONS  1. Left ventricular ejection fraction, by estimation, is 65 to 70%. The left ventricle has normal function. The left ventricle has no regional wall motion abnormalities. There is mild left ventricular hypertrophy. Left ventricular diastolic parameters are indeterminate.  2. Right ventricular systolic function is normal. The right ventricular size is normal. Tricuspid regurgitation signal is inadequate for assessing PA pressure.  3. The mitral valve is grossly normal.  4. Aortic valve regurgitation is mild.  5. Aortic dilatation noted. There is mild dilatation of the aortic root, measuring 42 mm. Comparison(s): Prior images reviewed side by side. Limited study. LVEF remains normal range. FINDINGS  Left Ventricle: Left ventricular ejection fraction, by estimation, is 65 to 70%. The left ventricle has normal function. The left ventricle has no regional wall motion abnormalities. The left ventricular internal cavity size was normal in size. There is  mild left ventricular hypertrophy. Left ventricular diastolic parameters are indeterminate. Right Ventricle: The right ventricular size is normal. No increase in right ventricular wall thickness. Right ventricular systolic function is normal. Tricuspid regurgitation signal is inadequate for assessing PA pressure. Pericardium: Presence of  pericardial fat pad. Mitral Valve: The mitral valve is grossly normal. There is mild calcification of the mitral valve leaflet(s). Aortic Valve: Aortic valve regurgitation is mild. Aorta: Aortic dilatation noted. There is mild dilatation of the aortic root, measuring 42 mm. Venous: IVC assessment for right atrial pressure unable to be performed due to mechanical ventilation. IAS/Shunts: No atrial level shunt detected by color flow Doppler. LEFT VENTRICLE PLAX 2D LVIDd:         4.40 cm LVIDs:         2.70 cm LV PW:         1.30 cm LV IVS:        1.30 cm LVOT diam:     2.30 cm LVOT Area:     4.15 cm  LEFT ATRIUM         Index LA diam:    2.00 cm 0.95 cm/m   AORTA Ao Root diam: 4.20 cm MITRAL VALVE MV Area (PHT): 2.33 cm    SHUNTS MV Decel Time: 326 msec    Systemic Diam: 2.30 cm MV E velocity: 62.10 cm/s MV A velocity: 82.70 cm/s MV E/A ratio:  0.75 Rozann Lesches MD Electronically signed by Rozann Lesches MD Signature Date/Time: 11/30/2020/2:13:35 PM    Final     Telemetry    12/02/20 NSR with frequent PVCs/NSVT - Personally Reviewed  ECG    No new tracing as of 12/02/20 - Personally Reviewed  Cardiac  Studies   Echocardiogram 11/28/20:    1. Left ventricular ejection fraction, by estimation, is 55 to 60%. The  left ventricle has normal function. The left ventricle has no regional  wall motion abnormalities. There is moderate concentric left ventricular  hypertrophy. Left ventricular  diastolic parameters are consistent with Grade I diastolic dysfunction  (impaired relaxation).   2. Right ventricular systolic function is normal. The right ventricular  size is normal. Tricuspid regurgitation signal is inadequate for assessing  PA pressure.   3. The mitral valve is normal in structure. Trivial mitral valve  regurgitation. No evidence of mitral stenosis.   4. The aortic valve is tricuspid. Aortic valve regurgitation is mild.   5. Aortic dilatation noted. There is mild dilatation of the aortic  root,  measuring 43 mm.   08/2017 echo Study Conclusions   - Left ventricle: Anbormal septal motion The cavity size was   normal. Wall thickness was increased in a pattern of moderate   LVH. Systolic function was normal. The estimated ejection   fraction was in the range of 55% to 60%. Wall motion was normal;   there were no regional wall motion abnormalities. Left   ventricular diastolic function parameters were normal. - Aortic valve: There was mild regurgitation. - Left atrium: The atrium was mildly dilated. - Atrial septum: No defect or patent foramen ovale was identified.   08/2017 carotid US   IMPRESSION: 1. Mild (1-49%) stenosis proximal left internal carotid artery secondary to heterogenous atherosclerotic plaque. 2. No significant atherosclerotic plaque or stenosis in the right internal carotid artery. 3. The bilateral vertebral arteries are patent with normal antegrade flow.   Patient Profile     75 y.o. male with a hx of CVA 2019, NSVT, DM2, hyponatremia, hypertension, and CKD stage II who is being seen 11/30/2020 for the evaluation of reported cardiac arrest at the request of [place]    1. Cardiac arrest: -Pt transferred from [facility] after a bradycardic event with frequent NSVT and SVT prior to the event. Precipitant for event unclear however he was hypoxic and altered. Treated with atropine and epinephrine, CPR for 1 minute with ROSC. No shocks required. He was intubated and transferred to [facility] from [facility] on Levophed, vasopressin, amiodarone, and fentanyl.  -Continues to have frequent ectopy with PVCs and NSVT worrisome for ischemic etiology. Plan for cath however patient remains fairly altered on exam and therefore cannot consent for procedure. Will attempt to call his brother, [name] once again. Will tentatively plan for cath tomorrow. -HsT, [phone] -Limited echocardiogram showed stable LVEF at 65-70% with no regional wall motion abnormalities, mild  LVH, mild AR and aortic dilatation measuring 78mm.  -Continue IV Heparin -Continue amiodarone -Keep K+ >4.0 and Mg+ >2.0 -Denies chest pain    2. Acute respiratory failure: -In the setting of recent cardiac arrest -Extubated 12/01/20 to [place] -Saturating well    3. CKD stage III: -Creatinine, 1.27 today  -Baseline appears to be in the 1.2 range    4. SBO: -Per abdominal CT>>surgery following  -NG to be replaced today  -No indication for surgical intervention at this time    5. Hypokalemia: -K+, 3.5 today  -Replaced per CCM   6. DM2: -SSI   Signed, Kathyrn Drown NP-C [facility] Pager: [phone] 12/02/2020, 7:51 AM     For questions or updates, please contact   Please consult [email or web address] for contact info under Cardiology/STEMI.  Personally seen and examined. Agree with APP above with the following comments:  Briefly 75 yo M with frequent ectopy and bradycardia leading to arrest. Patient notes is able to discuss his brother but is still a bit out of it.  No CP, SOB.   Family is at bedside today Exam notable for hypoactive bowel sounds, IRIR.  No leg swelling. Labs notable for creatinine 1.2 stable creatinine. Hypokalemia has been repleted.  Personally reviewed relevant tests; still have frequent, polymorphic PVCs and short runs of NSVT 5-6 beats Would recommend - continue IV heparin - continue IV amiodarone - getting XR today for SBO - will defer LHC for today; ischemic evaluation would be reasonable given his arrest and ectopy - Patient is presently NPO with NG tube replacement for today - ideally we would wait until bowel recovery prior to [facility] for PO loading of DAPT and maintenance - if change in clinical syndrome, Cangrelor and Asa could be used and time sensitive LHC could be pursued - discussed with patient and family; attempted to call brother Merrily Pew but his voicemail is full and not accepting new messages.  Rudean Haskell, MD [id number] [place], [place] [address] [phone]  11:24 AM

## 2020-12-02 NOTE — Evaluation (Signed)
Occupational Therapy Evaluation Patient Details Name: Albert Garrison MRN: [id number] DOB: [birth year] Today's Date: 12/02/2020    History of Present Illness 75 y.o. male presenting to [facility] ED 8/29 with diarrhea, decreased p.o. intake and nausea. Patient admitted with acute encephalopathy and symptomatic hyponatremia. Abdominal distension 8/27. CT abd (+) SBO and NGT placed. Hosptial admission complicated by continued impulsivity and agitation with patient pulling out multiple NGT's despite restraints. 8/28 wide QRS with frequent consecutive PVCs and LBBB. Bradycardic and hypotensive followed by loss of pulse. Code blue called. ROSC achieved after several mins of CPR. Troponin peaked at 309. Concern for possible STEMI. Transferred to [facility] for further treatment. PRVC 8/28-8/30. Cardiology following. PMHx significant for CKD, BPH, DMII, Hx of CVA, HTN, and GERD.   Clinical Impression   PTA patient was living alone in a ground floor apartment and was grossly I with ADLs/IADLs without AD. Patient currently functioning below baseline demonstrating observed ADLs including LB dressing and toileting with Min to Mod A +2. Patient also limited by deficits listed below including generalized weakness, decreased cognition, decreased balance, decreased activity tolerance and decreased cardiopulmonary status and would benefit from continued acute OT services in prep for safe d/c to next level of care. Given patient CLOF and deficits, recommendation for CIR. OT will continue to follow acutely.      Follow Up Recommendations  CIR    Equipment Recommendations  Other (comment) (Defer to next level of care.)    Recommendations for Other Services Rehab consult     Precautions / Restrictions Precautions Precautions: Fall Precaution Comments: Monitor BP Restrictions Weight Bearing Restrictions: No      Mobility Bed Mobility Overal bed mobility: Needs Assistance Bed Mobility: Supine to Sit     Supine to  sit: Mod assist     General bed mobility comments: Mod A to elevate trunk with HHA. Increased time/effort.    Transfers Overall transfer level: Needs assistance Equipment used: 2 person hand held assist Transfers: Sit to/from [facility] Sit to Stand: Mod assist;+2 physical assistance;+2 safety/equipment Stand pivot transfers: Mod assist;+2 physical assistance;+2 safety/equipment       General transfer comment: Mod A +2 for sit to stand from EOB and for stand-pivot to BSC > recliner. Patient with narrow BOS requiring cues to wide. Shuffle-like steps and posterior bias noted.    Balance Overall balance assessment: Needs assistance Sitting-balance support: Single extremity supported;Bilateral upper extremity supported;Feet supported Sitting balance-Leahy Scale: Fair   Postural control: Posterior lean Standing balance support: Bilateral upper extremity supported;During functional activity Standing balance-Leahy Scale: Poor Standing balance comment: Reliant on +2 external assist.                           ADL either performed or assessed with clinical judgement   ADL Overall ADL's : Needs assistance/impaired Eating/Feeding: NPO   Grooming: Minimal assistance;Sitting Grooming Details (indicate cue type and reason): Washed face seated EOB with set-up assist             Lower Body Dressing: Maximal assistance Lower Body Dressing Details (indicate cue type and reason): Max A to don footwear seated EOB. Toilet Transfer: Moderate assistance;+2 for physical assistance;+2 for safety/equipment Toilet Transfer Details (indicate cue type and reason): HHA and Mod A +2 for stand-pivot to [facility]. Posterior bias with cues for upright posture. Toileting- Water quality scientist and Hygiene: Sit to/from stand;Moderate assistance;+2 for physical assistance;+2 for safety/equipment Toileting - Clothing Manipulation Details (indicate cue type and  reason): Mod A +2 for  hygiene/clothing management.       General ADL Comments: Patient greatly limited by generalized weakness, decreased cog, and decreased activity tolerance.     Vision Baseline Vision/History: 1 Wears glasses Ability to See in Adequate Light: 0 Adequate Patient Visual Report: No change from baseline Vision Assessment?: No apparent visual deficits     Perception     Praxis      Pertinent Vitals/Pain Pain Assessment: No/denies pain     Hand Dominance Right   Extremity/Trunk Assessment Upper Extremity Assessment Upper Extremity Assessment: Generalized weakness;Difficult to assess due to impaired cognition   Lower Extremity Assessment Lower Extremity Assessment: Defer to PT evaluation   Cervical / Trunk Assessment Cervical / Trunk Assessment: Normal   Communication Communication Communication: No difficulties   Cognition Arousal/Alertness: Awake/alert Behavior During Therapy: Impulsive (Mildly) Overall Cognitive Status: Impaired/Different from baseline Area of Impairment: Orientation;Attention;Following commands;Safety/judgement;Awareness;Problem solving                 Orientation Level: Disoriented to;Time;Situation Current Attention Level: Sustained   Following Commands: Follows one step commands consistently;Follows one step commands with increased time Safety/Judgement: Decreased awareness of safety;Decreased awareness of deficits Awareness: Emergent Problem Solving: Slow processing General Comments: Patient requires cues for safety/sequencing; mildly impulsive   General Comments  Aunt present at bedside upon entry. Able to confirm PLOF and home set-up. Elevated BP with systolic in low [id number]. SpO2 >90% on 2L upon entry. Titrated to room air with slight drop. Returned to 2L at [employer] of session.    Exercises     Shoulder Instructions      Home Living Family/patient expects to be discharged to:: Private residence Living Arrangements: Alone Available  Help at Discharge: Family Type of Home: Apartment Home Access: Level entry (1st floor)     Home Layout: One level     Bathroom Shower/Tub: Corporate investment banker: Standard     Home Equipment: None          Prior Functioning/Environment Level of Independence: Independent        Comments: I with ADLs/IADLs without AD; drives; enjoys classic cars and going to church        OT Problem List: Decreased strength;Decreased activity tolerance;Impaired balance (sitting and/or standing);Decreased cognition;Decreased safety awareness;Decreased knowledge of use of DME or AE;Cardiopulmonary status limiting activity      OT Treatment/Interventions: Self-care/ADL training;Therapeutic exercise;Energy conservation;DME and/or AE instruction;Therapeutic activities;Cognitive remediation/compensation;Patient/family education;Balance training    OT Goals(Current goals can be found in the care plan section) Acute Rehab OT Goals Patient Stated Goal: To get better. OT Goal Formulation: With patient/family Time For Goal Achievement: 12/16/20 Potential to Achieve Goals: Good ADL Goals Pt Will Perform Grooming: with modified independence;standing Pt Will Perform Upper Body Dressing: Independently Pt Will Perform Lower Body Dressing: with modified independence;sit to/from stand Pt Will Transfer to Toilet: with modified independence;ambulating Pt Will Perform Toileting - Clothing Manipulation and hygiene: with modified independence;sit to/from stand Additional ADL Goal #1: Patient will follow 1-2 step verbal commands with 95% accuracy in prep for ADLs. Additional ADL Goal #2: Patient will score <4/28 on SBT indicating increased cognition in prep for safe return to PLOF.  OT Frequency: Min 2X/week   Barriers to D/C: Decreased caregiver support  Lives alone       Co-evaluation              AM-PAC OT "6 Clicks" Daily Activity     Outcome Measure Help from another person  eating meals?: Total (NPO) Help from another person taking care of personal grooming?: A Little Help from another person toileting, which includes using toliet, bedpan, or urinal?: A Lot Help from another person bathing (including washing, rinsing, drying)?: A Lot Help from another person to put on and taking off regular upper body clothing?: A Little Help from another person to put on and taking off regular lower body clothing?: A Lot 6 Click Score: 13   End of Session Equipment Utilized During Treatment: Gait belt;Oxygen (2L via [place]) Nurse Communication: Mobility status  Activity Tolerance: Patient tolerated treatment well;Patient limited by fatigue Patient left: in chair;with call bell/phone within reach;with chair alarm set;with restraints reapplied  OT Visit Diagnosis: Unsteadiness on feet (R26.81);Muscle weakness (generalized) (M62.81);Other symptoms and signs involving cognitive function                Time: [id number] OT Time Calculation (min): 31 min Charges:  OT General Charges $OT Visit: 1 Visit OT Evaluation $OT Eval Moderate Complexity: 1 Mod OT Treatments $Self Care/Home Management : 8-22 mins  Leyli Kevorkian H. OTR/L Supplemental OT, Department of rehab services [phone]  Laquasha Groome R H. 12/02/2020, 12:55 PM

## 2020-12-02 NOTE — Progress Notes (Signed)
   Daily Progress Note   Patient Name: Albert Garrison       Date: 12/02/2020 DOB: [birth year]  Age: 75 y.o. MRN#: [id number] Attending Physician: Maryjane Hurter, MD Primary Care Physician: Celene Squibb, MD Admit Date: 11/27/2020  Reason for Consultation/Follow-up: Establishing goals of care  Subjective: Chart Reviewed. Updates Received. Patient Assessed.   Patient awake and alert. He is confused today compared to yesterday. Alert to self, time, and his family Albert Garrison).  He denies pain.   I provided updates to aunt. She shared [place] with phone service.   She shares patient has 4 children however they do not have a good relationship and do not communicate often. She and patient's brother are his main contact and support system. She plans to reach out to patient's brother and provide updates. Albert Garrison confirms patient has always expressed wishes for time trial intubation but would not wish to be on machines long-term.   All questions answered and support provided.    Length of Stay: 4 days  Vital Signs: BP (!) 149/73   Pulse 66   Temp 98.2 F (36.8 C) (Oral)   Resp (!) 28   Ht '5\' 11"'$  (1.803 m)   Wt 91.5 kg   SpO2 100%   BMI 28.13 kg/m  SpO2: SpO2: 100 % O2 Device: O2 Device: Nasal Cannula O2 Flow Rate: O2 Flow Rate (L/min): 2 L/min  Physical Exam: Awake, alert, confused, elderly appearing RRR Diminished bilaterally Will follow simple commands Alert to self and year, family member               Palliative Care Assessment & Plan  HPI:  Palliative Care consult requested for goals of care discussion in this 75 y.o. male  with past medical history of diabetes, BPH, CKD II, depression, tobacco and alcohol use, stroke, hypertension, GERD, hard of hearing, and sleep apnea (no CPAP use). He was admitted on 11/27/2020 from home with complaints of generalized weakness, nausea and vomiting x24 hours. During work-up  abdominal x-ray showed concern for distal SBO. Surgery evaluated with recommendations for medical management and NG placement. 11/29/2020 he suffered a cardiac arrest with ROSC under a minute. Remains intubated and being followed by Cardiology for suspected STEMI. Patient was transferred to [facility] on 11/30/2020 for further evaluation and successfully extubated 12/01/2020.   Code Status: Full code  Goals of Care/Recommendations: Continue with current plan of care.  Attempted to reach brother again, however unable to reach.  PMT will continue to follow and support.   Prognosis: Guarded   Discharge Planning: To Be Determined  Thank you for allowing the Palliative Medicine Team to assist in the care of this patient.  Time Total: 40 min.   Visit consisted of counseling and education dealing with the complex and emotionally intense issues of symptom management and palliative care in the setting of serious and potentially life-threatening illness.Greater than 50%  of this time was spent counseling and coordinating care related to the above assessment and plan.  Alda Lea, AGPCNP-BC  Palliative Medicine Team [phone]

## 2020-12-02 NOTE — Progress Notes (Signed)
Inpatient Rehab Admissions Coordinator Note:   Per therapy recommendations patient was screened for CIR candidacy by Michel Santee, PT, DPT. At this time, pt appears to be a potential candidate for CIR. I will request an order for rehab consult for full assessment, per our protocol.  Please contact me any with questions.Shann Medal, PT, DPT [phone] 12/02/20 4:03 PM

## 2020-12-02 NOTE — Progress Notes (Signed)
ANTICOAGULATION CONSULT NOTE   Pharmacy Consult for Heparin Indication: chest pain/ACS  No Known Allergies  Patient Measurements: Height: 5\' 11"  (180.3 cm) Weight: 91.5 kg (201 lb 11.5 oz) IBW/kg (Calculated) : 75.3  Vital Signs: Temp: 98.2 F (36.8 C) (08/31 1145) Temp Source: Oral (08/31 1145) BP: 161/74 (08/31 1200) Pulse Rate: 73 (08/31 1200)  Labs: Recent Labs    11/30/20 0224 11/30/20 0225 11/30/20 0503 11/30/20 0730 11/30/20 0954 12/01/20 0410 12/01/20 0521 12/01/20 0540 12/01/20 0844 12/01/20 1941 12/02/20 0300 12/02/20 0536 12/02/20 1448  HGB  --  13.6  --   --   --  11.9* 10.9*  --   --   --  11.9*  --   --   HCT  --  41.8  --   --   --  35.8* 32.0*  --   --   --  36.2*  --   --   PLT  --  377  --   --   --  306  --   --   --   --  308  --   --   HEPARINUNFRC  --   --   --   --    < >  --   --   --    < > 0.15*  --  0.27* 0.34  CREATININE 1.30*  --   --   --   --  1.33*  --  1.27*  --   --  1.27*  --   --   TROPONINIHS 263*  --  306* 285*  --   --   --   --   --   --   --   --   --    < > = values in this interval not displayed.     Estimated Creatinine Clearance: 57.3 mL/min (A) (by C-G formula based on SCr of 1.27 mg/dL (H)).   Medical History: Past Medical History:  Diagnosis Date   Arthritis    BPH (benign prostatic hyperplasia)    CKD (chronic kidney disease), stage II    Complicated UTI (urinary tract infection) 03/2014   Depression    GERD (gastroesophageal reflux disease)    History of pneumonia    History of stroke    HOH (hard of hearing)    Hypertension    Hyponatremia    Lower GI bleed 2017   a. ? due to polyp.   NSVT (nonsustained ventricular tachycardia) (HCC)    Rheumatic fever    Childhood   Sleep apnea    Does not use CPAP   Type 2 diabetes mellitus (HCC)     Medications:  Scheduled:   bisacodyl  10 mg Rectal Once   bisacodyl  10 mg Rectal QHS   chlorhexidine gluconate (MEDLINE KIT)  15 mL Mouth Rinse BID    Chlorhexidine Gluconate Cloth  6 each Topical Daily   insulin aspart  0-15 Units Subcutaneous TID WC   insulin aspart  0-5 Units Subcutaneous QHS   mouth rinse  15 mL Mouth Rinse 10 times per day   metoprolol tartrate  5 mg Intravenous Q8H   sodium chloride flush  3 mL Intravenous Q12H   sodium chloride flush  3 mL Intravenous Q12H    Assessment: 75 y.o. male s/p cardiac arrest. Heparin started for possible ACS. Heparin level is therapeutic at 0.34 after bolus and dose increase.  No reports of issues with the line or bleeding per nursing. Will increase maintenance  rate by 50 units/hr to stay within goal. CBC was within normal limits. Plans for left heart cath 9/1. Will follow cardiology recommendations for anticoagulation after the procedure.  Goal of Therapy:  Heparin level 0.3-0.7 units/ml Monitor platelets by anticoagulation protocol: Yes   Plan:  Inc heparin to 2250 units/hr Heparin level and CBC daily  Thank you for allowing pharmacy to participate in this patient's care.  Reatha Harps, PharmD PGY1 Pharmacy Resident 12/02/2020 3:36 PM Check [email or web address] for unit specific pharmacy number

## 2020-12-02 NOTE — Progress Notes (Signed)
[facility] ADULT ICU REPLACEMENT PROTOCOL   The patient does apply for the [facility] Adult ICU Electrolyte Replacment Protocol based on the criteria listed below:   1.Exclusion criteria: TCTS patients, ECMO patients and Hypothermia Protocol, and   Dialysis patients 2. Is GFR >/= 30 ml/min? Yes.    Patient's GFR today is 59 3. Is SCr </= 2? Yes.   Patient's SCr is 1.27 mg/dL 4. Did SCr increase >/= 0.5 in 24 hours? No. 5.Pt's weight >40kg  Yes.   6. Abnormal electrolyte(s):  K 3.5, Phos 1.3  7. Electrolytes replaced per protocol 8.  Call MD STAT for K+ </= 2.5, Phos </= 1, or Mag </= 1 Physician:  Lowella Bandy R Deniqua Perry 12/02/2020 4:16 AM

## 2020-12-03 ENCOUNTER — Inpatient Hospital Stay: Payer: Self-pay

## 2020-12-03 DIAGNOSIS — I469 Cardiac arrest, cause unspecified: Secondary | ICD-10-CM | POA: Diagnosis not present

## 2020-12-03 DIAGNOSIS — K56609 Unspecified intestinal obstruction, unspecified as to partial versus complete obstruction: Secondary | ICD-10-CM | POA: Diagnosis not present

## 2020-12-03 DIAGNOSIS — J9601 Acute respiratory failure with hypoxia: Secondary | ICD-10-CM | POA: Diagnosis not present

## 2020-12-03 LAB — CBC
HCT: 37.1 % — ABNORMAL LOW (ref 39.0–52.0)
Hemoglobin: 12.4 g/dL — ABNORMAL LOW (ref 13.0–17.0)
MCH: 30.1 pg (ref 26.0–34.0)
MCHC: 33.4 g/dL (ref 30.0–36.0)
MCV: 90 fL (ref 80.0–100.0)
Platelets: 340 10*3/uL (ref 150–400)
RBC: 4.12 MIL/uL — ABNORMAL LOW (ref 4.22–5.81)
RDW: 14.1 % (ref 11.5–15.5)
WBC: 11 10*3/uL — ABNORMAL HIGH (ref 4.0–10.5)
nRBC: 0 % (ref 0.0–0.2)

## 2020-12-03 LAB — BASIC METABOLIC PANEL
Anion gap: 12 (ref 5–15)
Anion gap: 9 (ref 5–15)
BUN: 11 mg/dL (ref 8–23)
BUN: 9 mg/dL (ref 8–23)
CO2: 20 mmol/L — ABNORMAL LOW (ref 22–32)
CO2: 23 mmol/L (ref 22–32)
Calcium: 7.5 mg/dL — ABNORMAL LOW (ref 8.9–10.3)
Calcium: 8 mg/dL — ABNORMAL LOW (ref 8.9–10.3)
Chloride: 107 mmol/L (ref 98–111)
Chloride: 106 mmol/L (ref 98–111)
Creatinine, Ser: 1.24 mg/dL (ref 0.61–1.24)
Creatinine, Ser: 1.12 mg/dL (ref 0.61–1.24)
GFR, Estimated: >60 mL/min (ref >60)
GFR, Estimated: >60 mL/min (ref >60)
Glucose, Bld: 152 mg/dL — ABNORMAL HIGH (ref 70–99)
Glucose, Bld: 142 mg/dL — ABNORMAL HIGH (ref 70–99)
Potassium: 3.5 mmol/L (ref 3.5–5.1)
Potassium: 4.3 mmol/L (ref 3.5–5.1)
Sodium: 139 mmol/L (ref 135–145)
Sodium: 138 mmol/L (ref 135–145)

## 2020-12-03 LAB — CULTURE, RESPIRATORY W GRAM STAIN: Culture: NORMAL

## 2020-12-03 LAB — GLUCOSE, CAPILLARY
Glucose-Capillary: 147 mg/dL — ABNORMAL HIGH (ref 70–99)
Glucose-Capillary: 156 mg/dL — ABNORMAL HIGH (ref 70–99)
Glucose-Capillary: 170 mg/dL — ABNORMAL HIGH (ref 70–99)
Glucose-Capillary: 141 mg/dL — ABNORMAL HIGH (ref 70–99)
Glucose-Capillary: 128 mg/dL — ABNORMAL HIGH (ref 70–99)

## 2020-12-03 LAB — PHOSPHORUS
Phosphorus: 1.5 mg/dL — ABNORMAL LOW (ref 2.5–4.6)
Phosphorus: 2.2 mg/dL — ABNORMAL LOW (ref 2.5–4.6)

## 2020-12-03 LAB — HEPARIN LEVEL (UNFRACTIONATED): Heparin Unfractionated: 0.33 IU/mL (ref 0.30–0.70)

## 2020-12-03 LAB — MAGNESIUM: Magnesium: 2 mg/dL (ref 1.7–2.4)

## 2020-12-03 MED ORDER — INSULIN ASPART 100 UNIT/ML IJ SOLN
0.0000 [IU] | INTRAMUSCULAR | Status: DC
  Administered 2020-12-03: 3 [IU] via SUBCUTANEOUS
  Administered 2020-12-03 – 2020-12-04 (×2): 4 [IU] via SUBCUTANEOUS
  Administered 2020-12-04 (×2): 3 [IU] via SUBCUTANEOUS
  Administered 2020-12-04: 2 [IU] via SUBCUTANEOUS
  Administered 2020-12-04: 3 [IU] via SUBCUTANEOUS
  Administered 2020-12-05: 11 [IU] via SUBCUTANEOUS
  Administered 2020-12-05 (×4): 4 [IU] via SUBCUTANEOUS
  Administered 2020-12-05: 7 [IU] via SUBCUTANEOUS
  Administered 2020-12-06: 3 [IU] via SUBCUTANEOUS
  Administered 2020-12-06: 4 [IU] via SUBCUTANEOUS
  Administered 2020-12-06: 7 [IU] via SUBCUTANEOUS
  Administered 2020-12-06: 4 [IU] via SUBCUTANEOUS
  Administered 2020-12-06: 7 [IU] via SUBCUTANEOUS
  Administered 2020-12-07 (×2): 4 [IU] via SUBCUTANEOUS
  Administered 2020-12-07: 7 [IU] via SUBCUTANEOUS
  Administered 2020-12-07 (×3): 4 [IU] via SUBCUTANEOUS
  Administered 2020-12-08: 3 [IU] via SUBCUTANEOUS
  Administered 2020-12-08: 11 [IU] via SUBCUTANEOUS
  Administered 2020-12-08 (×2): 7 [IU] via SUBCUTANEOUS
  Administered 2020-12-08 – 2020-12-09 (×2): 4 [IU] via SUBCUTANEOUS
  Administered 2020-12-09: 8 [IU] via SUBCUTANEOUS
  Administered 2020-12-09 (×2): 7 [IU] via SUBCUTANEOUS

## 2020-12-03 MED ORDER — POTASSIUM CHLORIDE 10 MEQ/100ML IV SOLN
10.0000 meq | INTRAVENOUS | Status: AC
  Administered 2020-12-03 (×4): 10 meq via INTRAVENOUS
  Filled 2020-12-03 (×4): qty 100

## 2020-12-03 MED ORDER — ALBUTEROL SULFATE (2.5 MG/3ML) 0.083% IN NEBU
2.5000 mg | INHALATION_SOLUTION | Freq: Once | RESPIRATORY_TRACT | Status: AC | PRN
  Administered 2020-12-03: 2.5 mg via RESPIRATORY_TRACT
  Filled 2020-12-03: qty 3

## 2020-12-03 MED ORDER — SODIUM PHOSPHATES 45 MMOLE/15ML IV SOLN
15.0000 mmol | Freq: Once | INTRAVENOUS | Status: AC
  Administered 2020-12-04: 15 mmol via INTRAVENOUS
  Filled 2020-12-03: qty 5

## 2020-12-03 MED ORDER — ORAL CARE MOUTH RINSE
15.0000 mL | Freq: Two times a day (BID) | OROMUCOSAL | Status: DC
  Administered 2020-12-03 – 2020-12-23 (×41): 15 mL via OROMUCOSAL

## 2020-12-03 MED ORDER — MAGNESIUM SULFATE IN D5W 1-5 GM/100ML-% IV SOLN
1.0000 g | Freq: Once | INTRAVENOUS | Status: AC
  Administered 2020-12-03: 1 g via INTRAVENOUS
  Filled 2020-12-03: qty 100

## 2020-12-03 MED ORDER — POTASSIUM PHOSPHATES 15 MMOLE/5ML IV SOLN: 30.0000 mmol | Freq: Once | INTRAVENOUS | Status: DC

## 2020-12-03 MED ORDER — ENOXAPARIN SODIUM 40 MG/0.4ML IJ SOSY
40.0000 mg | PREFILLED_SYRINGE | INTRAMUSCULAR | Status: DC
  Administered 2020-12-03 – 2020-12-22 (×20): 40 mg via SUBCUTANEOUS
  Filled 2020-12-03 (×20): qty 0.4

## 2020-12-03 MED ORDER — TRAVASOL 10 % IV SOLN
INTRAVENOUS | Status: DC
  Filled 2020-12-03: qty 528

## 2020-12-03 MED ORDER — POTASSIUM PHOSPHATES 15 MMOLE/5ML IV SOLN
45.0000 mmol | Freq: Once | INTRAVENOUS | Status: AC
  Administered 2020-12-03: 45 mmol via INTRAVENOUS
  Filled 2020-12-03: qty 15

## 2020-12-03 MED FILL — Medication: Qty: 1 | Status: AC

## 2020-12-03 NOTE — Progress Notes (Addendum)
Progress Note  Patient Name: Albert Garrison Date of Encounter: 12/03/2020  Primary Cardiologist:   Dina Rich, MD   Subjective   Confused.  Denies pain or SOB.   Inpatient Medications    Scheduled Meds:  bisacodyl  10 mg Rectal Once   bisacodyl  10 mg Rectal QHS   chlorhexidine gluconate (MEDLINE KIT)  15 mL Mouth Rinse BID   Chlorhexidine Gluconate Cloth  6 each Topical Daily   insulin aspart  0-15 Units Subcutaneous TID WC   insulin aspart  0-5 Units Subcutaneous QHS   mouth rinse  15 mL Mouth Rinse q12n4p   metoprolol tartrate  5 mg Intravenous Q8H   sodium chloride flush  3 mL Intravenous Q12H   sodium chloride flush  3 mL Intravenous Q12H   Continuous Infusions:  sodium chloride Stopped (12/02/20 2236)   sodium chloride     sodium chloride Stopped (11/30/20 0009)   amiodarone 30 mg/hr (12/03/20 0030)   ampicillin-sulbactam (UNASYN) IV 1.5 g (12/03/20 0408)   heparin 2,250 Units/hr (12/03/20 0030)   potassium PHOSPHATE IVPB (in mmol)     PRN Meds: sodium chloride, acetaminophen **OR** acetaminophen, bisacodyl, hydrALAZINE, ondansetron **OR** ondansetron (ZOFRAN) IV, sodium chloride flush   Vital Signs    Vitals:   12/03/20 0410 12/03/20 0530 12/03/20 0700 12/03/20 0809  BP:  (!) 161/71 (!) 169/78   Pulse:  61 68   Resp:  (!) 28 (!) 29   Temp:    98 F (36.7 C)  TempSrc:    Oral  SpO2:  94% 95%   Weight: 95 kg     Height:        Intake/Output Summary (Last 24 hours) at 12/03/2020 0822 Last data filed at 12/03/2020 0320 Gross per 24 hour  Intake 1461.84 ml  Output 1950 ml  Net -488.16 ml   Filed Weights   12/01/20 1000 12/02/20 0500 12/03/20 0410  Weight: 91.2 kg 91.5 kg 95 kg    Telemetry    NSR - Personally Reviewed  ECG    NA - Personally Reviewed  Physical Exam   GEN: No acute distress.   Neck: No  JVD Cardiac: RRR, no murmurs, rubs, or gallops.  Respiratory: Clear  to auscultation bilaterally. GI: Soft, nontender, mildly  distended  MS:    Mild diffuse edema; No deformity. Neuro:  Nonfocal  Psych: Normal affect   Labs    Chemistry Recent Labs  Lab 11/27/20 7373 11/27/20 1250 11/30/20 0224 12/01/20 0410 12/01/20 0521 12/01/20 0540 12/02/20 0300 12/03/20 0217  NA 118*   < > 130* 133*   < > 133* 138 139  K 4.0   < > 4.5 3.6   < > 3.4* 3.5 3.5  CL 88*   < > 100 105  --  105 105 107  CO2 21*   < > 21* 19*  --  20* 20* 20*  GLUCOSE 266*   < > 242* 209*  --  199* 131* 152*  BUN 20   < > 22 22  --  23 16 11   CREATININE 1.27*   < > 1.30* 1.33*  --  1.27* 1.27* 1.24  CALCIUM 8.7*   < > 7.3* 7.4*  --  7.3* 7.7* 7.5*  PROT 7.0  --  5.6*  --   --  4.1*  --   --   ALBUMIN 3.6   < > 2.7* 1.9*  --  1.9*  --   --   AST 19  --  32  --   --  21  --   --   ALT 16  --  21  --   --  17  --   --   ALKPHOS 51  --  44  --   --  33*  --   --   BILITOT 1.8*  --  1.4*  --   --  1.0  --   --   GFRNONAA 59*   < > 57* 55*  --  59* 59* >60  ANIONGAP 9   < > 9 9  --  $R'8 13 12   'in$ < > = values in this interval not displayed.     Hematology Recent Labs  Lab 12/01/20 0410 12/01/20 0521 12/02/20 0300 12/03/20 0217  WBC 10.8*  --  11.1* 11.0*  RBC 3.93*  --  3.95* 4.12*  HGB 11.9* 10.9* 11.9* 12.4*  HCT 35.8* 32.0* 36.2* 37.1*  MCV 91.1  --  91.6 90.0  MCH 30.3  --  30.1 30.1  MCHC 33.2  --  32.9 33.4  RDW 13.7  --  13.9 14.1  PLT 306  --  308 340    Cardiac EnzymesNo results for input(s): TROPONINI in the last 168 hours. No results for input(s): TROPIPOC in the last 168 hours.   BNPNo results for input(s): BNP, PROBNP in the last 168 hours.   DDimer No results for input(s): DDIMER in the last 168 hours.   Radiology    DG Abd Portable 1V  Result Date: 12/02/2020 CLINICAL DATA:  Diarrhea and emesis, small-bowel obstruction EXAM: PORTABLE ABDOMEN - 1 VIEW COMPARISON:  Abdominal radiographs 11/30/2020, CT abdomen/pelvis 11/29/2020 FINDINGS: There are gas distended loops of small bowel throughout the abdomen  measuring up to 4.9 cm in diameter. The appearance is overall similar to the prior radiograph. There is no definite free intraperitoneal air, though evaluation is limited by single supine radiograph. There is no gross organomegaly or abnormal soft tissue calcification. There is degenerative change of the lumbar spine. IMPRESSION: Gas distended loops of small bowel throughout the abdomen measuring up to 4.9 cm, grossly similar to the prior radiograph. Electronically Signed   By: Valetta Mole M.D.   On: 12/02/2020 13:28    Cardiac Studies   ECHO:   1. Left ventricular ejection fraction, by estimation, is 65 to 70%. The  left ventricle has normal function. The left ventricle has no regional  wall motion abnormalities. There is mild left ventricular hypertrophy.  Left ventricular diastolic parameters  are indeterminate.   2. Right ventricular systolic function is normal. The right ventricular  size is normal. Tricuspid regurgitation signal is inadequate for assessing  PA pressure.   3. The mitral valve is grossly normal.   4. Aortic valve regurgitation is mild.   5. Aortic dilatation noted. There is mild dilatation of the aortic root,  measuring 42 mm.   Patient Profile     75 y.o. male with a hx of CVA 2019, NSVT, DM2, hyponatremia, hypertension, and CKD stage II who is being seen 11/30/2020 for the evaluation of reported cardiac arrest at the request of Dr.Emokpae  Assessment & Plan    Cardiac arrest:      Eventually we plan left heart cath pending improvement in other acute medical issues.    However, still confused and not taking PO.  I would suspect that he might not be ready until the beginning of next week.    PVCs:  Has been on IV  amiodarone.  Still with brief runs of multifocal VT.  Potassium is borderline and Mag is normal.  Supplement potassium to keep above 4.       Acute respiratory failure:  Extubated 8/30.     CKD stage III:   Creat is stable/down.     SBO:     Conservative  therapy per primary team.  NG came out and not able to be replaced .  Positive bowel sounds and he had a BM.     For questions or updates, please contact [place] Please consult [email or web address] for contact info under Cardiology/STEMI.   Signed, Minus Breeding, MD  12/03/2020, 8:22 AM

## 2020-12-03 NOTE — Progress Notes (Signed)
PHARMACY - TOTAL PARENTERAL NUTRITION CONSULT NOTE   Indication: Prolonged ileus  Patient Measurements: Height: '5\' 11"'$  (180.3 cm) Weight: 95 kg (209 lb 7 oz) IBW/kg (Calculated) : 75.3 TPN AdjBW (KG): 87.2 Body mass index is 29.21 kg/m. Usual Weight: 190 lbs  Assessment: 75 yo male presented on 11/27/2020 to [facility] with diarrhea and CT concerning for SBO. Patient with VT and cardiac arrest on 8/28. While patient was intubated NG tube was unable to be placed despite multiple attempts due to history of nose surgery. Speech evaluation on 8/31 with recommendation for NPO due to swallow status impacted by mental status. Pharmacy consulted to start TPN for prolonged ileus and partial SBO.   Glucose / Insulin: T2DM. A1c 8.6. Received 5 units / 24 hrs. CBGs 112-152. - on metformin pta.  Electrolytes: K 3.5 (s/p ~66 mEq K with K phos; goal >/= 4.0), Cl 107, CO2 20, CocCa 9.18, Phos 1.5 (s/p 45 mmol K phos), Mg 2.0 (goal >/= 2.0) Renal: Scr 1.24 - at baseline. BUN 11.  Hepatic: LFTs wnl on 8/30. Albumin 1.9. Tbili 1.0. No recent TG.  Intake / Output; MIVF: UOP 0.9 ml/kg/hr. LBM 8/31. Pos 8.7 L. No MIVF. GI Imaging: - 8/27 Abd x-ray: suggestive of distal SBO - 8/28 CT Abd: SBO with transition point in R mid abdomen - 8/29 Abd x-ray: persistent dilation of SB consistent with obstruction 8/31 Abd x-ray: gas distended loops of SB throughout (measuring 4.9 cm), similar to previous GI Surgeries / Procedures: None this admission  Central access: PICC to be placed 9/1  TPN start date: 9/1  Nutritional Goals: Goal TPN rate is 90 mL/hr (provides 118 g of protein and 2151 kcals per day)  RD Assessment: Estimated Needs Total Energy Estimated Needs: 2100-2300 Total Protein Estimated Needs: 110-130 grams Total Fluid Estimated Needs: >/= 2.0 L since admit  Current Nutrition:  NPO TPN - starting today  Plan:  Start TPN at 17m/hr at 1800 - this will provide 53g AA and 956 kcal Electrolytes in TPN: Na  592m/L, K 5087mL, Ca 5mE38m, Mg 5mEq39m and Phos 20mmo13m Cl:Ac 1:1 Supplement Kcl 10 mEq IV x 4 and Mg 1g x1 (discussed with Dr. HochrePercival Spanisheep K >/= 4.0 and Mg >/= 2.0 Continue phos replacement already ordered per MD - recheck phos 6 hs after completion of infusion and replace as needed  Add standard MVI and trace elements to TPN Add insulin 15 units to TPN (~0.11 units per 1g of dextrose in TPN) Initiate Resistant q4h SSI and adjust as needed  Monitor TPN labs on Mon/Thurs, recheck TPN labs tomorrow  Wenzel Backlund Cristela FeltmD, BCPS Clinical Pharmacist 12/03/2020 10:32 AM

## 2020-12-03 NOTE — Progress Notes (Signed)
[facility] ADULT ICU REPLACEMENT PROTOCOL   The patient does apply for the [facility] Adult ICU Electrolyte Replacment Protocol based on the criteria listed below:   1.Exclusion criteria: TCTS patients, ECMO patients and Hypothermia Protocol, and   Dialysis patients 2. Is GFR >/= 30 ml/min? Yes.    Patient's GFR today is >60 3. Is SCr </= 2? Yes.   Patient's SCr is 1.24 mg/dL 4. Did SCr increase >/= 0.5 in 24 hours? No. 5.Pt's weight >40kg  Yes.   6. Abnormal electrolyte(s):  K 3.5, Phos 1.5  7. Electrolytes replaced per protocol 8.  Call MD STAT for K+ </= 2.5, Phos </= 1, or Mag </= 1 Physician:  Lowella Bandy R Jayleena Stille 12/03/2020 5:24 AM

## 2020-12-03 NOTE — Progress Notes (Signed)
Physical Therapy Treatment Patient Details Name: Albert Garrison MRN: [id number] DOB: [birth year] Today's Date: 12/03/2020    History of Present Illness 75 y.o. male presenting to [facility] ED 8/29 with diarrhea, decreased p.o. intake and nausea. Patient admitted with acute encephalopathy and symptomatic hyponatremia. Abdominal distension 8/27. CT abd (+) SBO and NGT placed. Hosptial admission complicated by continued impulsivity and agitation with patient pulling out multiple NGT's despite restraints. 8/28 wide QRS with frequent consecutive PVCs and LBBB. Bradycardic and hypotensive followed by loss of pulse. Code blue called. ROSC achieved after several mins of CPR. Troponin peaked at 309. Concern for possible STEMI. Transferred to [facility] for further treatment. PRVC 8/28-8/30. Cardiology following. PMHx significant for CKD, BPH, DMII, Hx of CVA, HTN, and GERD.    PT Comments    Pt eager to get OOB today, once transitioning toward EOB, needing to get to [facility].  Pt still quite confused and needing direction cues and redirection to task.  Emphasis on transition, scooting, transfers and progression of gait stability and stamina.    Follow Up Recommendations  CIR;Supervision/Assistance - 24 hour     Equipment Recommendations  Other (comment) (TBA)    Recommendations for Other Services       Precautions / Restrictions Precautions Precautions: Fall    Mobility  Bed Mobility Overal bed mobility: Needs Assistance       Supine to sit: Min assist     General bed mobility comments: cues for direction, up via R elbow and min stability assist up until pt able to push up with R UE.    Transfers Overall transfer level: Needs assistance   Transfers: Sit to/from Stand;Stand Pivot Transfers Sit to Stand: Min assist Stand pivot transfers: Min guard       General transfer comment: cues for hand placement, stability assist  Ambulation/Gait Ambulation/Gait assistance: Min assist;+2 physical  assistance Gait Distance (Feet): 60 Feet Assistive device: IV Pole Gait Pattern/deviations: Step-through pattern   Gait velocity interpretation: <1.31 ft/sec, indicative of household ambulator General Gait Details: short, mildly unsteady steps, stability assist given at upper trunk   Stairs             Wheelchair Mobility    Modified Rankin (Stroke Patients Only)       Balance Overall balance assessment: Needs assistance Sitting-balance support: Single extremity supported;Feet supported;No upper extremity supported Sitting balance-Leahy Scale: Fair       Standing balance-Leahy Scale: Poor Standing balance comment: Reliant on external assist or at least 1 UE                            Cognition Arousal/Alertness: Awake/alert Behavior During Therapy: Impulsive;WFL for tasks assessed/performed Overall Cognitive Status: Impaired/Different from baseline Area of Impairment: Orientation;Attention;Following commands;Safety/judgement;Awareness;Problem solving                 Orientation Level: Disoriented to;Time;Situation;Place Current Attention Level: Sustained   Following Commands: Follows one step commands consistently;Follows one step commands with increased time Safety/Judgement: Decreased awareness of safety;Decreased awareness of deficits Awareness: Emergent Problem Solving: Slow processing        Exercises Other Exercises Other Exercises: warm up LE ROM exercise prior to mobility    General Comments        Pertinent Vitals/Pain Pain Assessment: Faces Faces Pain Scale: No hurt Pain Intervention(s): Monitored during session    Home Living  Prior Function            PT Goals (current goals can now be found in the care plan section) Acute Rehab PT Goals Patient Stated Goal: To get better. PT Goal Formulation: With patient Time For Goal Achievement: 12/16/20 Potential to Achieve Goals: Good Progress  towards PT goals: Progressing toward goals    Frequency    Min 3X/week      PT Plan Current plan remains appropriate    Co-evaluation              AM-PAC PT "6 Clicks" Mobility   Outcome Measure  Help needed turning from your back to your side while in a flat bed without using bedrails?: A Little Help needed moving from lying on your back to sitting on the side of a flat bed without using bedrails?: A Little Help needed moving to and from a bed to a chair (including a wheelchair)?: A Little Help needed standing up from a chair using your arms (e.g., wheelchair or bedside chair)?: A Little Help needed to walk in hospital room?: A Lot Help needed climbing 3-5 steps with a railing? : A Lot 6 Click Score: 16    End of Session   Activity Tolerance: Patient tolerated treatment well Patient left: in bed;with call bell/phone within reach;with bed alarm set;with nursing/sitter in room Nurse Communication: Mobility status PT Visit Diagnosis: Unsteadiness on feet (R26.81);Other abnormalities of gait and mobility (R26.89);Muscle weakness (generalized) (M62.81);Difficulty in walking, not elsewhere classified (R26.2)     Time: [id number] PT Time Calculation (min) (ACUTE ONLY): 25 min  Charges:  $Gait Training: 8-22 mins $Therapeutic Activity: 8-22 mins                     12/03/2020  Ginger Carne., PT Acute Rehabilitation Services [phone]  (pager) [phone]  (office)   Tessie Fass Jhordan Kinter 12/03/2020, 7:23 PM

## 2020-12-03 NOTE — Progress Notes (Addendum)
PROGRESS NOTE  KROSS SWALLOWS [id number] DOB: [birth year] DOA: 11/27/2020 PCP: Celene Squibb, MD   LOS: 5 days   Brief Narrative / Interim history: 75 year old male with history of hypertension, hyperlipidemia, former smoker, DM2, history of lower GI bleed, chronic kidney disease stage II comes into the hospital with nausea, poor p.o. intake and decreased appetite.  He presented initially to [facility] and was admitted 8/26.  He was found to be hyponatremic with a sodium of 118, had acute kidney injury and leukocytosis.  He was confused on admission thought to be secondary to hyponatremia.  Abdominal x-ray on hospital day #2 showed small bowel obstruction, and general surgery was consulted.  CT scan of the abdomen and pelvis showed a transition point in the right mid abdomen.  Hospital course was complicated by increased ectopy on telemetry and eventually had cardiac arrest late PM 8/28 when he became unresponsive and had agonal respirations.  CODE BLUE was called and ROSC was achieved in approximately 5 minutes.  There was concern about new onset left bundle branch block/STEMI and he was started on heparin, amiodarone and cardiology was consulted.  He was transferred to [facility] events 8/26 Admit 8/26 N/V 8/27 KUB with concern for distal SBO. ECHO with LVEF 55-60%, no RWMA, grade I diastolic dysfunction 9/48 CT ABD with small bowel obstruction with likely transition point in the right mid abdomen.  CT Head negative.  8/28 cardiac arrest >>NSVT with somewhat more sustained runs, progressive IVCD by ECG, reportedly bradycardia and unresponsiveness.  He was treated with atropine and epinephrine, had compressions under a minute with ROSC, no shocks delivered.  He was intubated and did require initiation of pressors including Levophed and vasopressin, also started on amiodarone 8/29 [facility] cardiac ongoing evaluation 8/29 Echo LVEF 65-70%, otherwise  grossly normal.  8/30 extubated  9/1-TRH took over   Subjective / 24h Interval events: He is confused this morning, alert but does not know where he is and has no significant insight into his hospitalization  Assessment & Plan: Principal Problem NSVT, bradycardic arrest, concern for LBBB/STEMI-cardiology consulted, following.  This may have been due to multiple electrolyte disturbances versus Precedex related -Cardiology following, recommending continuing amiodarone for now.  He was initially on heparin but now he is on Lovenox subcu for DVT prophylaxis -Cardiology plans to do left heart cath for ischemic evaluation however his mental status needs to be better  Active Problems Small bowel obstruction-with concern for transition point on the CT scan of the abdomen and pelvis.  He had an OGT which was pulled when extubated, currently without nausea and vomiting so we will leave the NG tube out -Apparently patient had a bowel movement last night, general surgery following -Speech to see today, okay for ice chips/sips from surgery standpoint  Acute kidney injury on CKD stage II-Baseline creatinine around 1.2, currently at baseline  Fever- no obvious source. ? Aspiration during arrest. Cultures pending. Very mild leukocytosis.  - Unasyn to cover aspiration, plan for total of 5 days   Symptomatic Hyponatremia  -Hyponatremia resolved, sodium 139 this morning.  Continue to monitor.  Hypoxic respiratory failure in the setting of cardiac arrest-extubated 8/30.  Wean off oxygen as tolerated  DM II - Poorly controlled, admit A1c 8.6.   -SSI   CBG (last 3)  Recent Labs    12/02/20 1922 12/02/20 2214 12/03/20 0807  GLUCAP 122* 112* 147*    HTN - continue schedule metoprolol  IV until he can take orals.      Scheduled Meds:  bisacodyl  10 mg Rectal Once   bisacodyl  10 mg Rectal QHS   chlorhexidine gluconate (MEDLINE KIT)  15 mL Mouth Rinse BID   Chlorhexidine Gluconate Cloth  6 each  Topical Daily   enoxaparin (LOVENOX) injection  40 mg Subcutaneous Q24H   insulin aspart  0-15 Units Subcutaneous TID WC   insulin aspart  0-5 Units Subcutaneous QHS   mouth rinse  15 mL Mouth Rinse q12n4p   metoprolol tartrate  5 mg Intravenous Q8H   sodium chloride flush  3 mL Intravenous Q12H   sodium chloride flush  3 mL Intravenous Q12H   Continuous Infusions:  sodium chloride Stopped (12/03/20 0511)   sodium chloride Stopped (12/03/20 1101)   sodium chloride Stopped (11/30/20 0009)   amiodarone 30 mg/hr (12/03/20 1114)   ampicillin-sulbactam (UNASYN) IV 200 mL/hr at 12/03/20 1114   magnesium sulfate bolus IVPB     potassium chloride     potassium PHOSPHATE IVPB (in mmol) 86 mL/hr at 12/03/20 1114   PRN Meds:.sodium chloride, acetaminophen **OR** acetaminophen, bisacodyl, hydrALAZINE, ondansetron **OR** ondansetron (ZOFRAN) IV, sodium chloride flush  Diet Orders (From admission, onward)     Start     Ordered   12/02/20 0001  Diet NPO time specified  Diet effective midnight        12/01/20 1242            DVT prophylaxis: enoxaparin (LOVENOX) injection 40 mg Start: 12/03/20 1600 SCDs Start: 11/27/20 1854 Place TED hose Start: 11/27/20 1854     Code Status: Full Code  Family Communication: No family at bedside  Status is: Inpatient  Remains inpatient appropriate because:Inpatient level of care appropriate due to severity of illness  Dispo: The patient is from: Home              Anticipated d/c is to: SNF              Patient currently is not medically stable to d/c.   Difficult to place patient No  Level of care: Progressive  Consultants:  PCCM General surgery Cardiology   Procedures:  2D echo:   1. Left ventricular ejection fraction, by estimation, is 65 to 70%. The left ventricle has normal function. The left ventricle has no regional wall motion abnormalities. There is mild left ventricular hypertrophy. Left ventricular diastolic parameters are  indeterminate.   2. Right ventricular systolic function is normal. The right ventricular size is normal. Tricuspid regurgitation signal is inadequate for assessing PA pressure.   3. The mitral valve is grossly normal.   4. Aortic valve regurgitation is mild.   5. Aortic dilatation noted. There is mild dilatation of the aortic root, measuring 42 mm.  Microbiology  Respiratory culture 8/30-normal respiratory flora, no staph or Pseudomonas  Antimicrobials: Unasyn    Objective: Vitals:   12/03/20 0800 12/03/20 0809 12/03/20 0900 12/03/20 1000  BP: (!) 163/81   (!) 166/78  Pulse: (!) 58  63 64  Resp: (!) 22  (!) 28 (!) 22  Temp:  98 F (36.7 C)    TempSrc:  Oral    SpO2: 92%  93% 95%  Weight:      Height:        Intake/Output Summary (Last 24 hours) at 12/03/2020 1124 Last data filed at 12/03/2020 1114 Gross per 24 hour  Intake 1642.03 ml  Output 1950 ml  Net -307.97 ml   [employer]  12/01/20 1000 12/02/20 0500 12/03/20 0410  Weight: 91.2 kg 91.5 kg 95 kg    Examination:  Constitutional: NAD, mild agitation noted Eyes: no scleral icterus ENMT: Mucous membranes are moist.  Neck: normal, supple Respiratory: clear to auscultation bilaterally, no wheezing, no crackles. Normal respiratory effort.  Cardiovascular: Regular rate and rhythm, no murmurs / rubs / gallops. No LE edema. Good peripheral pulses Abdomen: Mild distention noted, no tenderness, no guarding or rebound. Bowel sounds positive.  Musculoskeletal: no clubbing / cyanosis.  Skin: no rashes Neurologic: Nonfocal  Data Reviewed: I have independently reviewed following labs and imaging studies   CBC: Recent Labs  Lab 11/27/20 0943 11/28/20 0429 11/29/20 2243 11/30/20 0225 12/01/20 0410 12/01/20 0521 12/02/20 0300 12/03/20 0217  WBC 14.3*   < > 15.8* 12.9* 10.8*  --  11.1* 11.0*  NEUTROABS 11.7*  --   --   --   --   --   --   --   HGB 14.8   < > 14.7 13.6 11.9* 10.9* 11.9* 12.4*  HCT 42.5   < > 44.5  41.8 35.8* 32.0* 36.2* 37.1*  MCV 89.3   < > 93.7 93.7 91.1  --  91.6 90.0  PLT 232   < > 415* 377 306  --  308 340   < > = values in this interval not displayed.   Basic Metabolic Panel: Recent Labs  Lab 11/27/20 1918 11/28/20 0429 11/29/20 0023 11/29/20 2243 11/30/20 0224 12/01/20 0410 12/01/20 0521 12/01/20 0540 12/02/20 0300 12/02/20 2000 12/03/20 0217  NA 123*   < > 131*  130* 132* 130* 133* 136 133* 138  --  139  K 3.7   < > 3.3*  3.3* 3.9 4.5 3.6 3.5 3.4* 3.5  --  3.5  CL 93*   < > 96*  96* 99 100 105  --  105 105  --  107  CO2 23   < > 25  24 21* 21* 19*  --  20* 20*  --  20*  GLUCOSE 232*   < > 172*  174* 186* 242* 209*  --  199* 131*  --  152*  BUN 16   < > $R'20  20 20 22 22  'Bo$ --  23 16  --  11  CREATININE 1.20   < > 1.21  1.22 1.19 1.30* 1.33*  --  1.27* 1.27*  --  1.24  CALCIUM 8.0*   < > 8.4*  8.3* 7.7* 7.3* 7.4*  --  7.3* 7.7*  --  7.5*  MG 1.8  --  2.4 2.5*  --   --   --   --  2.4  --  2.0  PHOS 2.0*  --  2.3*  --  2.6 1.8*  --   --  1.3* 1.3* 1.5*   < > = values in this interval not displayed.   Liver Function Tests: Recent Labs  Lab 11/27/20 0943 11/29/20 0023 11/30/20 0224 12/01/20 0410 12/01/20 0540  AST 19  --  32  --  21  ALT 16  --  21  --  17  ALKPHOS 51  --  44  --  33*  BILITOT 1.8*  --  1.4*  --  1.0  PROT 7.0  --  5.6*  --  4.1*  ALBUMIN 3.6 3.2* 2.7* 1.9* 1.9*   Coagulation Profile: No results for input(s): INR, PROTIME in the last 168 hours. HbA1C: No results for input(s): HGBA1C in the last 72  hours. CBG: Recent Labs  Lab 12/02/20 1144 12/02/20 1604 12/02/20 1922 12/02/20 2214 12/03/20 0807  GLUCAP 151* 134* 122* 112* 147*    Recent Results (from the past 240 hour(s))  Resp Panel by RT-PCR (Flu A&B, Covid) Nasopharyngeal Swab     Status: None   Collection Time: 11/27/20  9:16 AM   Specimen: Nasopharyngeal Swab; Nasopharyngeal(NP) swabs in vial transport medium  Result Value Ref Range Status   SARS Coronavirus 2 by RT  PCR NEGATIVE NEGATIVE Final    Comment: (NOTE) SARS-CoV-2 target nucleic acids are NOT DETECTED.  The SARS-CoV-2 RNA is generally detectable in upper respiratory specimens during the acute phase of infection. The lowest concentration of SARS-CoV-2 viral copies this assay can detect is 138 copies/mL. A negative result does not preclude SARS-Cov-2 infection and should not be used as the sole basis for treatment or other patient management decisions. A negative result may occur with  improper specimen collection/handling, submission of specimen other than nasopharyngeal swab, presence of viral mutation(s) within the areas targeted by this assay, and inadequate number of viral copies(<138 copies/mL). A negative result must be combined with clinical observations, patient history, and epidemiological information. The expected result is Negative.  Fact Sheet for Patients:  EntrepreneurPulse.com.au  Fact Sheet for Healthcare Providers:  IncredibleEmployment.be  This test is no t yet approved or cleared by the [place] FDA and  has been authorized for detection and/or diagnosis of SARS-CoV-2 by FDA under an Emergency Use Authorization (EUA). This EUA will remain  in effect (meaning this test can be used) for the duration of the COVID-19 declaration under Section 564(b)(1) of the Act, 21 U.S.C.section 360bbb-3(b)(1), unless the authorization is terminated  or revoked sooner.       Influenza A by PCR NEGATIVE NEGATIVE Final   Influenza B by PCR NEGATIVE NEGATIVE Final    Comment: (NOTE) The Xpert Xpress SARS-CoV-2/FLU/RSV plus assay is intended as an aid in the diagnosis of influenza from Nasopharyngeal swab specimens and should not be used as a sole basis for treatment. Nasal washings and aspirates are unacceptable for Xpert Xpress SARS-CoV-2/FLU/RSV testing.  Fact Sheet for Patients: EntrepreneurPulse.com.au  Fact Sheet for  Healthcare Providers: IncredibleEmployment.be  This test is not yet approved or cleared by the [place] FDA and has been authorized for detection and/or diagnosis of SARS-CoV-2 by FDA under an Emergency Use Authorization (EUA). This EUA will remain in effect (meaning this test can be used) for the duration of the COVID-19 declaration under Section 564(b)(1) of the Act, 21 U.S.C. section 360bbb-3(b)(1), unless the authorization is terminated or revoked.  Performed at [facility], [address], [place], [place] [address]   MRSA Next Gen by PCR, Nasal     Status: None   Collection Time: 11/27/20 12:15 PM   Specimen: Nasal Mucosa; Nasal Swab  Result Value Ref Range Status   MRSA by PCR Next Gen NOT DETECTED NOT DETECTED Final    Comment: (NOTE) The GeneXpert MRSA Assay (FDA approved for NASAL specimens only), is one component of a comprehensive MRSA colonization surveillance program. It is not intended to diagnose MRSA infection nor to guide or monitor treatment for MRSA infections. Test performance is not FDA approved in patients less than 43 years old. Performed at [facility], [address], [place], [place] [address]   Culture, blood (Routine X 2) w Reflex to ID Panel     Status: None (Preliminary result)   Collection Time: 11/30/20 12:35 PM   Specimen: Right  Antecubital; Blood  Result Value Ref Range Status   Specimen Description   Final    RIGHT ANTECUBITAL BOTTLES DRAWN AEROBIC AND ANAEROBIC   Special Requests Blood Culture adequate volume  Final   Culture   Final    NO GROWTH 3 DAYS Performed at [facility], [address] [place], [place] [address]    Report Status PENDING  Incomplete  Culture, blood (Routine X 2) w Reflex to ID Panel     Status: None (Preliminary result)   Collection Time: 11/30/20 12:35 PM   Specimen: BLOOD LEFT HAND  Result Value Ref Range Status   Specimen Description   Final    BLOOD LEFT HAND BOTTLES DRAWN AEROBIC AND  ANAEROBIC   Special Requests Blood Culture adequate volume  Final   Culture   Final    NO GROWTH 3 DAYS Performed at [facility], [address] [place], [place] [address]    Report Status PENDING  Incomplete  Culture, Respiratory w Gram Stain     Status: None   Collection Time: 12/01/20  8:30 AM   Specimen: Tracheal Aspirate  Result Value Ref Range Status   Specimen Description TRACHEAL ASPIRATE  Final   Special Requests NONE  Final   Gram Stain   Final    ABUNDANT WBC PRESENT, PREDOMINANTLY PMN ABUNDANT GRAM NEGATIVE RODS FEW GRAM POSITIVE COCCI IN CHAINS    Culture   Final    RARE Normal respiratory flora-no Staph aureus or Pseudomonas seen Performed at [facility], [place] [address] [place],  [address]    Report Status 12/03/2020 FINAL  Final     Radiology Studies: [place] EKG SITE RITE  Result Date: 12/03/2020 If Site Rite image not attached, placement could not be confirmed due to current cardiac rhythm.    Marzetta Board, MD, PhD [facility]  Between 7 am - 7 pm I am available, please contact me via Amion (for emergencies) or Securechat (non urgent messages)  Between 7 pm - 7 am I am not available, please contact night coverage MD/APP via Amion

## 2020-12-03 NOTE — Progress Notes (Signed)
  Speech Language Pathology Treatment: Dysphagia  Patient Details Name: Albert Garrison MRN: [id number] DOB: [birth year] Today's Date: 12/03/2020 Time: [id number] SLP Time Calculation (min) (ACUTE ONLY): 15 min  Assessment / Plan / Recommendation Clinical Impression  Pt was seen for dysphagia treatment. He was alert and cooperative, but confused and his responses were often unrelated to questions. Pt tolerated individual sips of thin liquids via cup and ice chips without overt s/sx of aspiration. Oral phase was [facility]. Pt demonstrated coughing with thin liquids via tsp (administered by SLP), and inconsistently with thin liquids via straw and with consecutive swallows of thin liquids via cup. Pt may have ice chips and sips of clear liquids as allowed by surgery.    HPI HPI: 75 y.o. male presented to [facility] ED with diarrhea, decreased p.o. intake and nausea. Pt admitted with acute encephalopathy and symptomatic hyponatremia. Abdominal distension 8/27. CT abd/pelvis (8/28) (+) SBO and NGT placed. 8/28 pt went into cardiac arrest, concern for possible STEMI. Transferred to [facility] for further treatment. CT head (8/28) revealed no acute intracranial abnormality. Chest xray (8/30) noted "appearance of airspace disease in the right middle lobe. Small left pleural effusion". Hosptial admission complicated by continued impulsivity and agitation with pt pulling out multiple NGT's despite restraints.  ETT: 8/28-8/30. PMHx significant for CKD, BPH, DMII, Hx of CVA, HTN, and GERD. SLE (08/23/2017) revealed cognitive communication deficits post CVA.      SLP Plan  Continue with current plan of care       Recommendations  Diet recommendations:  (Pt may have ice chips and sips of clear liquids following oral care) Liquids provided via: Cup;No straw Medication Administration: Via alternative means Compensations: Slow rate;Minimize environmental distractions (individual swallows; avoid consecutive swallows)                 Oral Care Recommendations: Oral care QID;Staff/trained caregiver to provide oral care Follow up Recommendations: Other (comment) (TBD) SLP Visit Diagnosis: Dysphagia, unspecified (R13.10) Plan: Continue with current plan of care       Dannie Hattabaugh I. Hardin Negus, [place], [place] Office number [phone] Pager [phone]                Horton Marshall 12/03/2020, 1:10 PM

## 2020-12-03 NOTE — Progress Notes (Signed)
Progress Note     Subjective: At time of my exam he is a little more confused today than yesterday - states year is 2023 and repeats answers to my questions. He denies nausea or emesis. He confirms charted BM. He denies abdominal pain   Objective: Vital signs in last 24 hours: Temp:  [98.2 F (36.8 C)-99.9 F (37.7 C)] 99.2 F (37.3 C) (09/01 0329) Pulse Rate:  [61-78] 61 (09/01 0530) Resp:  [18-37] 28 (09/01 0530) BP: (132-175)/(53-93) 161/71 (09/01 0530) SpO2:  [90 %-100 %] 94 % (09/01 0530) Weight:  [95 kg] 95 kg (09/01 0410) Last BM Date: 11/30/20  Intake/Output from previous day: 08/31 0701 - 09/01 0700 In: 1686.3 [I.V.:994.9; IV Piggyback:691.4] Out: 1950 [Urine:1950] Intake/Output this shift: No intake/output data recorded.  PE: General: pleasant, WD, male who is laying in bed in NAD HEENT: head is normocephalic, atraumatic. Mouth is pink and moist Heart: Palpable radial and pedal pulses bilaterally Lungs: respiratory effort nonlabored- [place] not in nares at time of my exam Abd: soft, NT. Mild to moderate distension stable from yesterday. Bowel sounds present  MSK: all 4 extremities symmetrical bilaterally without edema Skin: warm and dry with no masses, lesions, or rashes Psych: A&Ox3 with an appropriate affect.    Lab Results:  Recent Labs    12/02/20 0300 12/03/20 0217  WBC 11.1* 11.0*  HGB 11.9* 12.4*  HCT 36.2* 37.1*  PLT 308 340    BMET Recent Labs    12/02/20 0300 12/03/20 0217  NA 138 139  K 3.5 3.5  CL 105 107  CO2 20* 20*  GLUCOSE 131* 152*  BUN 16 11  CREATININE 1.27* 1.24  CALCIUM 7.7* 7.5*    PT/INR No results for input(s): LABPROT, INR in the last 72 hours. CMP     Component Value Date/Time   NA 139 12/03/2020 0217   K 3.5 12/03/2020 0217   CL 107 12/03/2020 0217   CO2 20 (L) 12/03/2020 0217   GLUCOSE 152 (H) 12/03/2020 0217   BUN 11 12/03/2020 0217   CREATININE 1.24 12/03/2020 0217   CALCIUM 7.5 (L) 12/03/2020 0217    CALCIUM 8.2 (L) 03/26/2014 0556   PROT 4.1 (L) 12/01/2020 0540   ALBUMIN 1.9 (L) 12/01/2020 0540   AST 21 12/01/2020 0540   ALT 17 12/01/2020 0540   ALKPHOS 33 (L) 12/01/2020 0540   BILITOT 1.0 12/01/2020 0540   GFRNONAA >60 12/03/2020 0217   GFRAA 41 (L) 08/28/2018 1217   Lipase     Component Value Date/Time   LIPASE 19 11/27/2020 0943       Studies/Results: DG Abd Portable 1V  Result Date: 12/02/2020 CLINICAL DATA:  Diarrhea and emesis, small-bowel obstruction EXAM: PORTABLE ABDOMEN - 1 VIEW COMPARISON:  Abdominal radiographs 11/30/2020, CT abdomen/pelvis 11/29/2020 FINDINGS: There are gas distended loops of small bowel throughout the abdomen measuring up to 4.9 cm in diameter. The appearance is overall similar to the prior radiograph. There is no definite free intraperitoneal air, though evaluation is limited by single supine radiograph. There is no gross organomegaly or abnormal soft tissue calcification. There is degenerative change of the lumbar spine. IMPRESSION: Gas distended loops of small bowel throughout the abdomen measuring up to 4.9 cm, grossly similar to the prior radiograph. Electronically Signed   By: Valetta Mole M.D.   On: 12/02/2020 13:28    Anti-infectives: Anti-infectives (From admission, onward)    Start     Dose/Rate Route Frequency Ordered Stop   12/02/20 1511  ampicillin-sulbactam (UNASYN) 1.5 g in sodium chloride 0.9 % 100 mL IVPB        1.5 g 200 mL/hr over 30 Minutes Intravenous Every 6 hours 12/02/20 1324     12/01/20 1630  ampicillin-sulbactam (UNASYN) 1.5 g in sodium chloride 0.9 % 100 mL IVPB  Status:  Discontinued        1.5 g 200 mL/hr over 30 Minutes Intravenous Every 6 hours 12/01/20 1531 12/01/20 1534   12/01/20 1630  ampicillin-sulbactam (UNASYN) 1.5 g in sodium chloride 0.9 % 100 mL IVPB  Status:  Discontinued        1.5 g 200 mL/hr over 30 Minutes Intravenous Every 8 hours 12/01/20 1534 12/02/20 1324   11/30/20 1000  vancomycin  (VANCOREADY) IVPB 750 mg/150 mL  Status:  Discontinued        750 mg 150 mL/hr over 60 Minutes Intravenous Every 12 hours 11/30/20 0043 12/01/20 0937   11/30/20 0130  vancomycin (VANCOREADY) IVPB 1500 mg/300 mL        1,500 mg 150 mL/hr over 120 Minutes Intravenous  Once 11/30/20 0043 11/30/20 0511   11/30/20 0130  ceFEPIme (MAXIPIME) 2 g in sodium chloride 0.9 % 100 mL IVPB  Status:  Discontinued        2 g 200 mL/hr over 30 Minutes Intravenous Every 12 hours 11/30/20 0043 12/01/20 0937        Assessment/Plan SBO - OGT out 8/30 with extubation. Repeated failed NG placement attempts in setting of h/o nose surgery - no nausea/emesis and BM yesterday - SLP evaluated and following with reccs for NPO/ice chips with swallow status likely impacted by his mental status. From bowel function standpoint he is okay for sips of clears and ice chips - at this point he has been NPO for ~5-6 days. Bowel function improving but given his course ongoing ileus/sbo not unexpected and aspiration risk per SLP. Recommend TPN and will continue to follow bowel function  FEN: NPO/ice chips ID: [email or web address] VTE: heparin gtt  Bradycardic arrest Acute hypoxic respiratory failure Aspiration pneumonia AKI  DM2    LOS: 5 days    Winferd Humphrey, [facility] 12/03/2020, 7:35 AM Please see Amion for pager number during day hours 7:00am-4:30pm

## 2020-12-04 ENCOUNTER — Inpatient Hospital Stay (HOSPITAL_COMMUNITY): Payer: Medicare Other

## 2020-12-04 DIAGNOSIS — I469 Cardiac arrest, cause unspecified: Secondary | ICD-10-CM | POA: Diagnosis not present

## 2020-12-04 DIAGNOSIS — G9340 Encephalopathy, unspecified: Secondary | ICD-10-CM | POA: Diagnosis not present

## 2020-12-04 DIAGNOSIS — J9601 Acute respiratory failure with hypoxia: Secondary | ICD-10-CM | POA: Diagnosis not present

## 2020-12-04 DIAGNOSIS — K56609 Unspecified intestinal obstruction, unspecified as to partial versus complete obstruction: Secondary | ICD-10-CM | POA: Diagnosis not present

## 2020-12-04 DIAGNOSIS — Z515 Encounter for palliative care: Secondary | ICD-10-CM | POA: Diagnosis not present

## 2020-12-04 LAB — COMPREHENSIVE METABOLIC PANEL
ALT: 28 U/L (ref 0–44)
AST: 32 U/L (ref 15–41)
Albumin: 2.1 g/dL — ABNORMAL LOW (ref 3.5–5.0)
Alkaline Phosphatase: 44 U/L (ref 38–126)
Anion gap: 11 (ref 5–15)
BUN: 8 mg/dL (ref 8–23)
CO2: 22 mmol/L (ref 22–32)
Calcium: 7.8 mg/dL — ABNORMAL LOW (ref 8.9–10.3)
Chloride: 104 mmol/L (ref 98–111)
Creatinine, Ser: 1.14 mg/dL (ref 0.61–1.24)
GFR, Estimated: >60 mL/min (ref >60)
Glucose, Bld: 114 mg/dL — ABNORMAL HIGH (ref 70–99)
Potassium: 3.3 mmol/L — ABNORMAL LOW (ref 3.5–5.1)
Sodium: 137 mmol/L (ref 135–145)
Total Bilirubin: 0.7 mg/dL (ref 0.3–1.2)
Total Protein: 4.9 g/dL — ABNORMAL LOW (ref 6.5–8.1)

## 2020-12-04 LAB — PHOSPHORUS
Phosphorus: 2.5 mg/dL (ref 2.5–4.6)
Phosphorus: 2.1 mg/dL — ABNORMAL LOW (ref 2.5–4.6)

## 2020-12-04 LAB — GLUCOSE, CAPILLARY
Glucose-Capillary: 108 mg/dL — ABNORMAL HIGH (ref 70–99)
Glucose-Capillary: 126 mg/dL — ABNORMAL HIGH (ref 70–99)
Glucose-Capillary: 122 mg/dL — ABNORMAL HIGH (ref 70–99)
Glucose-Capillary: 122 mg/dL — ABNORMAL HIGH (ref 70–99)
Glucose-Capillary: 153 mg/dL — ABNORMAL HIGH (ref 70–99)

## 2020-12-04 LAB — MAGNESIUM
Magnesium: 1.9 mg/dL (ref 1.7–2.4)
Magnesium: 2.1 mg/dL (ref 1.7–2.4)

## 2020-12-04 LAB — POTASSIUM: Potassium: 3.8 mmol/L (ref 3.5–5.1)

## 2020-12-04 LAB — TRIGLYCERIDES: Triglycerides: 120 mg/dL (ref <150)

## 2020-12-04 MED ORDER — ONDANSETRON HCL 4 MG/2ML IJ SOLN
4.0000 mg | Freq: Four times a day (QID) | INTRAMUSCULAR | Status: DC | PRN
  Administered 2020-12-05: 4 mg via INTRAVENOUS
  Filled 2020-12-04 (×2): qty 2

## 2020-12-04 MED ORDER — MENTHOL 3 MG MT LOZG
1.0000 | LOZENGE | OROMUCOSAL | Status: DC | PRN
  Filled 2020-12-04: qty 9

## 2020-12-04 MED ORDER — POTASSIUM CHLORIDE 10 MEQ/100ML IV SOLN
10.0000 meq | INTRAVENOUS | Status: AC
  Administered 2020-12-04 (×4): 10 meq via INTRAVENOUS
  Filled 2020-12-04 (×4): qty 100

## 2020-12-04 MED ORDER — METOPROLOL TARTRATE 12.5 MG HALF TABLET
12.5000 mg | ORAL_TABLET | Freq: Two times a day (BID) | ORAL | Status: DC
  Administered 2020-12-04 – 2020-12-06 (×4): 12.5 mg
  Filled 2020-12-04 (×4): qty 1

## 2020-12-04 MED ORDER — PROSOURCE TF PO LIQD
45.0000 mL | Freq: Two times a day (BID) | ORAL | Status: DC
  Administered 2020-12-04 – 2020-12-17 (×25): 45 mL
  Filled 2020-12-04 (×29): qty 45

## 2020-12-04 MED ORDER — OSMOLITE 1.5 CAL PO LIQD
1000.0000 mL | ORAL | Status: DC
  Administered 2020-12-04 – 2020-12-16 (×10): 1000 mL
  Filled 2020-12-04 (×21): qty 1000

## 2020-12-04 MED ORDER — MAGNESIUM SULFATE IN D5W 1-5 GM/100ML-% IV SOLN
1.0000 g | Freq: Once | INTRAVENOUS | Status: AC
  Administered 2020-12-04: 1 g via INTRAVENOUS
  Filled 2020-12-04: qty 100

## 2020-12-04 MED ORDER — GUAIFENESIN-DM 100-10 MG/5ML PO SYRP
10.0000 mL | ORAL_SOLUTION | ORAL | Status: DC | PRN
  Administered 2020-12-04: 10 mL via ORAL
  Filled 2020-12-04 (×2): qty 10

## 2020-12-04 MED ORDER — METOPROLOL TARTRATE 12.5 MG HALF TABLET
12.5000 mg | ORAL_TABLET | Freq: Two times a day (BID) | ORAL | Status: DC
  Administered 2020-12-04: 12.5 mg via ORAL
  Filled 2020-12-04: qty 1

## 2020-12-04 MED ORDER — LORAZEPAM 2 MG/ML IJ SOLN
0.5000 mg | INTRAMUSCULAR | Status: DC | PRN
  Administered 2020-12-04 – 2020-12-12 (×5): 0.5 mg via INTRAVENOUS
  Filled 2020-12-04 (×6): qty 1

## 2020-12-04 MED ORDER — ONDANSETRON HCL 4 MG PO TABS
4.0000 mg | ORAL_TABLET | Freq: Four times a day (QID) | ORAL | Status: DC | PRN
  Administered 2020-12-11: 4 mg
  Filled 2020-12-04: qty 1

## 2020-12-04 NOTE — Progress Notes (Signed)
Physical Therapy Treatment Patient Details Name: Albert Garrison MRN: [id number] DOB: 1945/10/27 Today's Date: 12/04/2020    History of Present Illness 75 y.o. male presenting to [facility] ED 8/29 with diarrhea, decreased p.o. intake and nausea. Patient admitted with acute encephalopathy and symptomatic hyponatremia. Abdominal distension 8/27. CT abd (+) SBO and NGT placed. Hosptial admission complicated by continued impulsivity and agitation with patient pulling out multiple NGT's despite restraints. 8/28 wide QRS with frequent consecutive PVCs and LBBB. Bradycardic and hypotensive followed by loss of pulse. Code blue called. ROSC achieved after several mins of CPR. Troponin peaked at 309. Concern for possible STEMI. Transferred to [facility] for further treatment. PRVC 8/28-8/30. Cardiology following. PMHx significant for CKD, BPH, DMII, Hx of CVA, HTN, and GERD.    PT Comments    Pt still rather confused, just caught trying to "escape" prior to arrival, having pulled most lines.  Emphasis on reorienting, transition to EOB, work on sit to stand and progression of gait without AD, RW not available at the time.  Discussed with RN, ambulating in the unit with a RW this weekend.    Follow Up Recommendations  CIR;Supervision/Assistance - 24 hour     Equipment Recommendations  Other (comment) (TBA)    Recommendations for Other Services       Precautions / Restrictions Precautions Precautions: Fall    Mobility  Bed Mobility Overal bed mobility: Needs Assistance Bed Mobility: Supine to Sit;Sit to Supine     Supine to sit: Min assist Sit to supine: Min guard   General bed mobility comments: cues for direction, up via L elbow and min stability assist up until pt able to push up with R UE.    Transfers Overall transfer level: Needs assistance   Transfers: Sit to/from Stand Sit to Stand: Min guard         General transfer comment: cues for hand placement, stability  assist  Ambulation/Gait Ambulation/Gait assistance: Min assist;+2 safety/equipment Gait Distance (Feet): 120 Feet (then 80 feet after standing rest) Assistive device: 1 person hand held assist;None Gait Pattern/deviations: Step-through pattern   Gait velocity interpretation: <1.8 ft/sec, indicate of risk for recurrent falls General Gait Details: short, mildly unsteady steps, stability assist given at upper trunk.  pt drifted and was mildly more unsteady with scanning   Stairs             Wheelchair Mobility    Modified Rankin (Stroke Patients Only)       Balance Overall balance assessment: Needs assistance Sitting-balance support: Single extremity supported;No upper extremity supported Sitting balance-Leahy Scale: Fair     Standing balance support: Single extremity supported;Bilateral upper extremity supported;During functional activity Standing balance-Leahy Scale: Poor Standing balance comment: Reliant on external assist or at least 1 UE                            Cognition Arousal/Alertness: Awake/alert Behavior During Therapy: Impulsive;Restless Overall Cognitive Status: Impaired/Different from baseline                   Orientation Level: Place;Time;Situation Current Attention Level: Sustained   Following Commands: Follows one step commands consistently Safety/Judgement: Decreased awareness of deficits;Decreased awareness of safety Awareness: Emergent Problem Solving: Slow processing        Exercises      General Comments General comments (skin integrity, edema, etc.): vss      Pertinent Vitals/Pain Pain Assessment: Faces Faces Pain Scale: No hurt Pain Intervention(s):  Monitored during session    Home Living                      Prior Function            PT Goals (current goals can now be found in the care plan section) Acute Rehab PT Goals PT Goal Formulation: With patient Time For Goal Achievement:  12/16/20 Potential to Achieve Goals: Good Progress towards PT goals: Progressing toward goals    Frequency    Min 3X/week      PT Plan Current plan remains appropriate    Co-evaluation              AM-PAC PT "6 Clicks" Mobility   Outcome Measure  Help needed turning from your back to your side while in a flat bed without using bedrails?: A Little Help needed moving from lying on your back to sitting on the side of a flat bed without using bedrails?: A Little Help needed moving to and from a bed to a chair (including a wheelchair)?: A Little Help needed standing up from a chair using your arms (e.g., wheelchair or bedside chair)?: A Little Help needed to walk in hospital room?: A Little Help needed climbing 3-5 steps with a railing? : A Lot 6 Click Score: 17    End of Session   Activity Tolerance: Patient tolerated treatment well Patient left: in bed;with call bell/phone within reach;with bed alarm set;with nursing/sitter in room Nurse Communication: Mobility status PT Visit Diagnosis: Unsteadiness on feet (R26.81);Other abnormalities of gait and mobility (R26.89);Muscle weakness (generalized) (M62.81);Difficulty in walking, not elsewhere classified (R26.2)     Time: [id number] PT Time Calculation (min) (ACUTE ONLY): 32 min  Charges:  $Gait Training: 8-22 mins $Therapeutic Activity: 8-22 mins                     12/04/2020  Ginger Carne., PT Acute Rehabilitation Services [phone]  (pager) [phone]  (office)   Tessie Fass Netha Dafoe 12/04/2020, 3:23 PM

## 2020-12-04 NOTE — Progress Notes (Signed)
PROGRESS NOTE  Albert Garrison YQM:[id number] DOB: September 04, 1945 DOA: 11/27/2020 PCP: Celene Squibb, MD   LOS: 6 days   Brief Narrative / Interim history: 75 year old male with history of hypertension, hyperlipidemia, former smoker, DM2, history of lower GI bleed, chronic kidney disease stage II comes into the hospital with nausea, poor p.o. intake and decreased appetite.  He presented initially to [facility] and was admitted 8/26.  He was found to be hyponatremic with a sodium of 118, had acute kidney injury and leukocytosis.  He was confused on admission thought to be secondary to hyponatremia.  Abdominal x-ray on hospital day #2 showed small bowel obstruction, and general surgery was consulted.  CT scan of the abdomen and pelvis showed a transition point in the right mid abdomen.  Hospital course was complicated by increased ectopy on telemetry and eventually had cardiac arrest late PM 8/28 when he became unresponsive and had agonal respirations.  CODE BLUE was called and ROSC was achieved in approximately 5 minutes.  There was concern about new onset left bundle branch block/STEMI and he was started on heparin, amiodarone and cardiology was consulted.  He was transferred to [facility] events 8/26 Admit 8/26 N/V 8/27 KUB with concern for distal SBO. ECHO with LVEF 55-60%, no RWMA, grade I diastolic dysfunction 8/89 CT ABD with small bowel obstruction with likely transition point in the right mid abdomen.  CT Head negative.  8/28 cardiac arrest >>NSVT with somewhat more sustained runs, progressive IVCD by ECG, reportedly bradycardia and unresponsiveness.  He was treated with atropine and epinephrine, had compressions under a minute with ROSC, no shocks delivered.  He was intubated and did require initiation of pressors including Levophed and vasopressin, also started on amiodarone 8/29 Transfer to [facility] for surgical / cardiac ongoing evaluation 8/29 Echo LVEF 65-70%, otherwise  grossly normal.  8/30 extubated  9/1-TRH took over   Subjective / 24h Interval events: Remains confused this morning.  Smiling, no significant complaints  Assessment & Plan: Principal Problem NSVT, bradycardic arrest, concern for LBBB/STEMI-cardiology consulted, following.  This may have been due to multiple electrolyte disturbances versus Precedex related -Cardiology following, recommending continuing amiodarone for now.  He was initially on heparin but now he is on Lovenox subcu for DVT prophylaxis -Cardiology plans to do left heart cath for ischemic evaluation however his mental status needs to be better  Active Problems Small bowel obstruction-with concern for transition point on the CT scan of the abdomen and pelvis.  He had an OGT which was pulled when extubated, currently without nausea and vomiting so we will leave the NG tube out -Apparently patient had a bowel movement last night, general surgery following -His small bowel appears to be slowly resolving, started having bowel movements.  Failed SLP evaluation, getting a core track today, start tube feeds  Acute kidney injury on CKD stage II-Baseline creatinine around 1.2, currently at baseline  Fever- no obvious source. ? Aspiration during arrest. Cultures pending. Very mild leukocytosis.  - Unasyn to cover aspiration, plan for total of 5 days, today's last day   Symptomatic Hyponatremia  -Hyponatremia resolved, sodium stable today within normal limits  Hypoxic respiratory failure in the setting of cardiac arrest-extubated 8/30.  Wean off oxygen as tolerated  DM II - Poorly controlled, admit A1c 8.6.   -SSI   CBG (last 3)  Recent Labs    12/03/20 2309 12/04/20 0341 12/04/20 0710  GLUCAP 128* 108* 126*     HTN -  continue schedule metoprolol IV until he can take orals.      Scheduled Meds:  bisacodyl  10 mg Rectal QHS   chlorhexidine gluconate (MEDLINE KIT)  15 mL Mouth Rinse BID   Chlorhexidine Gluconate Cloth   6 each Topical Daily   enoxaparin (LOVENOX) injection  40 mg Subcutaneous Q24H   insulin aspart  0-20 Units Subcutaneous Q4H   mouth rinse  15 mL Mouth Rinse q12n4p   sodium chloride flush  3 mL Intravenous Q12H   sodium chloride flush  3 mL Intravenous Q12H   Continuous Infusions:  sodium chloride Stopped (12/03/20 0511)   sodium chloride 10 mL/hr at 12/04/20 0600   sodium chloride Stopped (11/30/20 0009)   amiodarone 30 mg/hr (12/04/20 0628)   ampicillin-sulbactam (UNASYN) IV 1.5 g (12/04/20 0838)   PRN Meds:.sodium chloride, acetaminophen **OR** acetaminophen, bisacodyl, hydrALAZINE, ondansetron **OR** ondansetron (ZOFRAN) IV, sodium chloride flush  Diet Orders (From admission, onward)     Start     Ordered   12/04/20 0802  Diet clear liquid Room service appropriate? Yes; Fluid consistency: Thin  Diet effective now       Question Answer Comment  Room service appropriate? Yes   Fluid consistency: Thin      12/04/20 0801            DVT prophylaxis: enoxaparin (LOVENOX) injection 40 mg Start: 12/03/20 1600 SCDs Start: 11/27/20 1854 Place TED hose Start: 11/27/20 1854     Code Status: Full Code  Family Communication: No family at bedside  Status is: Inpatient  Remains inpatient appropriate because:Inpatient level of care appropriate due to severity of illness  Dispo: The patient is from: Home              Anticipated d/c is to: SNF              Patient currently is not medically stable to d/c.   Difficult to place patient No  Level of care: Progressive  Consultants:  PCCM General surgery Cardiology   Procedures:  2D echo:   1. Left ventricular ejection fraction, by estimation, is 65 to 70%. The left ventricle has normal function. The left ventricle has no regional wall motion abnormalities. There is mild left ventricular hypertrophy. Left ventricular diastolic parameters are indeterminate.   2. Right ventricular systolic function is normal. The right  ventricular size is normal. Tricuspid regurgitation signal is inadequate for assessing PA pressure.   3. The mitral valve is grossly normal.   4. Aortic valve regurgitation is mild.   5. Aortic dilatation noted. There is mild dilatation of the aortic root, measuring 42 mm.  Microbiology  Respiratory culture 8/30-normal respiratory flora, no staph or Pseudomonas  Antimicrobials: Unasyn    Objective: Vitals:   12/04/20 0713 12/04/20 0800 12/04/20 0900 12/04/20 1000  BP:  (!) 168/74 (!) 165/80 (!) 181/94  Pulse:  64 63 67  Resp:  (!) 25 (!) 28 (!) 26  Temp: 97.9 F (36.6 C)     TempSrc: Oral     SpO2:  94% 94% 94%  Weight:      Height:        Intake/Output Summary (Last 24 hours) at 12/04/2020 1041 Last data filed at 12/04/2020 1000 Gross per 24 hour  Intake 2281.04 ml  Output 3275 ml  Net -993.96 ml    Filed Weights   12/02/20 0500 12/03/20 0410 12/04/20 0425  Weight: 91.5 kg 95 kg 89.3 kg    Examination:  Constitutional: No distress, pleasant  Eyes: No scleral icterus ENMT: mmm Neck: normal, supple Respiratory: CTA bilaterally, no wheezing, no crackles, normal respiratory effort Cardiovascular: Regular rate and rhythm, no murmurs, no peripheral edema Abdomen: No tenderness, no guarding or rebound, bowel sounds positive Musculoskeletal: no clubbing / cyanosis.  Skin: No new rashes Neurologic: Alert to self, exam nonfocal  Data Reviewed: I have independently reviewed following labs and imaging studies   CBC: Recent Labs  Lab 11/29/20 2243 11/30/20 0225 12/01/20 0410 12/01/20 0521 12/02/20 0300 12/03/20 0217  WBC 15.8* 12.9* 10.8*  --  11.1* 11.0*  HGB 14.7 13.6 11.9* 10.9* 11.9* 12.4*  HCT 44.5 41.8 35.8* 32.0* 36.2* 37.1*  MCV 93.7 93.7 91.1  --  91.6 90.0  PLT 415* 377 306  --  308 740    Basic Metabolic Panel: Recent Labs  Lab 11/29/20 0023 11/29/20 2243 11/30/20 0224 12/01/20 0540 12/02/20 0300 12/02/20 2000 12/03/20 0217 12/03/20 2115  12/04/20 0259  NA 131*  130* 132*   < > 133* 138  --  139 138 137  K 3.3*  3.3* 3.9   < > 3.4* 3.5  --  3.5 4.3 3.3*  CL 96*  96* 99   < > 105 105  --  107 106 104  CO2 25  24 21*   < > 20* 20*  --  20* 23 22  GLUCOSE 172*  174* 186*   < > 199* 131*  --  152* 142* 114*  BUN $Re'20  20 20   'zxP$ < > 23 16  --  $R'11 9 8  'qP$ CREATININE 1.21  1.22 1.19   < > 1.27* 1.27*  --  1.24 1.12 1.14  CALCIUM 8.4*  8.3* 7.7*   < > 7.3* 7.7*  --  7.5* 8.0* 7.8*  MG 2.4 2.5*  --   --  2.4  --  2.0  --  1.9  PHOS 2.3*  --    < >  --  1.3* 1.3* 1.5* 2.2* 2.5   < > = values in this interval not displayed.    Liver Function Tests: Recent Labs  Lab 11/29/20 0023 11/30/20 0224 12/01/20 0410 12/01/20 0540 12/04/20 0259  AST  --  32  --  21 32  ALT  --  21  --  17 28  ALKPHOS  --  44  --  33* 44  BILITOT  --  1.4*  --  1.0 0.7  PROT  --  5.6*  --  4.1* 4.9*  ALBUMIN 3.2* 2.7* 1.9* 1.9* 2.1*    Coagulation Profile: No results for input(s): INR, PROTIME in the last 168 hours. HbA1C: No results for input(s): HGBA1C in the last 72 hours. CBG: Recent Labs  Lab 12/03/20 1628 12/03/20 2026 12/03/20 2309 12/04/20 0341 12/04/20 0710  GLUCAP 170* 141* 128* 108* 126*     Recent Results (from the past 240 hour(s))  Resp Panel by RT-PCR (Flu A&B, Covid) Nasopharyngeal Swab     Status: None   Collection Time: 11/27/20  9:16 AM   Specimen: Nasopharyngeal Swab; Nasopharyngeal(NP) swabs in vial transport medium  Result Value Ref Range Status   SARS Coronavirus 2 by RT PCR NEGATIVE NEGATIVE Final    Comment: (NOTE) SARS-CoV-2 target nucleic acids are NOT DETECTED.  The SARS-CoV-2 RNA is generally detectable in upper respiratory specimens during the acute phase of infection. The lowest concentration of SARS-CoV-2 viral copies this assay can detect is 138 copies/mL. A negative result does not preclude SARS-Cov-2 infection and should  not be used as the sole basis for treatment or other patient management  decisions. A negative result may occur with  improper specimen collection/handling, submission of specimen other than nasopharyngeal swab, presence of viral mutation(s) within the areas targeted by this assay, and inadequate number of viral copies(<138 copies/mL). A negative result must be combined with clinical observations, patient history, and epidemiological information. The expected result is Negative.  Fact Sheet for Patients:  BloggerCourse.com  Fact Sheet for Healthcare Providers:  SeriousBroker.it  This test is no t yet approved or cleared by the [place] FDA and  has been authorized for detection and/or diagnosis of SARS-CoV-2 by FDA under an Emergency Use Authorization (EUA). This EUA will remain  in effect (meaning this test can be used) for the duration of the COVID-19 declaration under Section 564(b)(1) of the Act, 21 U.S.C.section 360bbb-3(b)(1), unless the authorization is terminated  or revoked sooner.       Influenza A by PCR NEGATIVE NEGATIVE Final   Influenza B by PCR NEGATIVE NEGATIVE Final    Comment: (NOTE) The Xpert Xpress SARS-CoV-2/FLU/RSV plus assay is intended as an aid in the diagnosis of influenza from Nasopharyngeal swab specimens and should not be used as a sole basis for treatment. Nasal washings and aspirates are unacceptable for Xpert Xpress SARS-CoV-2/FLU/RSV testing.  Fact Sheet for Patients: BloggerCourse.com  Fact Sheet for Healthcare Providers: SeriousBroker.it  This test is not yet approved or cleared by the [place] FDA and has been authorized for detection and/or diagnosis of SARS-CoV-2 by FDA under an Emergency Use Authorization (EUA). This EUA will remain in effect (meaning this test can be used) for the duration of the COVID-19 declaration under Section 564(b)(1) of the Act, 21 U.S.C. section 360bbb-3(b)(1), unless the  authorization is terminated or revoked.  Performed at [facility], [address], [place], [place] [address]   MRSA Next Gen by PCR, Nasal     Status: None   Collection Time: 11/27/20 12:15 PM   Specimen: Nasal Mucosa; Nasal Swab  Result Value Ref Range Status   MRSA by PCR Next Gen NOT DETECTED NOT DETECTED Final    Comment: (NOTE) The GeneXpert MRSA Assay (FDA approved for NASAL specimens only), is one component of a comprehensive MRSA colonization surveillance program. It is not intended to diagnose MRSA infection nor to guide or monitor treatment for MRSA infections. Test performance is not FDA approved in patients less than 37 years old. Performed at [facility], [address], [place], [place] [address]   Culture, blood (Routine X 2) w Reflex to ID Panel     Status: None (Preliminary result)   Collection Time: 11/30/20 12:35 PM   Specimen: Right Antecubital; Blood  Result Value Ref Range Status   Specimen Description   Final    RIGHT ANTECUBITAL BOTTLES DRAWN AEROBIC AND ANAEROBIC   Special Requests Blood Culture adequate volume  Final   Culture   Final    NO GROWTH 4 DAYS Performed at [facility], [address] [place], [place] [address]    Report Status PENDING  Incomplete  Culture, blood (Routine X 2) w Reflex to ID Panel     Status: None (Preliminary result)   Collection Time: 11/30/20 12:35 PM   Specimen: BLOOD LEFT HAND  Result Value Ref Range Status   Specimen Description   Final    BLOOD LEFT HAND BOTTLES DRAWN AEROBIC AND ANAEROBIC   Special Requests Blood Culture adequate volume  Final   Culture  Final    NO GROWTH 4 DAYS Performed at [facility], [address] [place], [place] [address]    Report Status PENDING  Incomplete  Culture, Respiratory w Gram Stain     Status: None   Collection Time: 12/01/20  8:30 AM   Specimen: Tracheal Aspirate  Result Value Ref Range Status   Specimen Description TRACHEAL ASPIRATE  Final   Special Requests NONE   Final   Gram Stain   Final    ABUNDANT WBC PRESENT, PREDOMINANTLY PMN ABUNDANT GRAM NEGATIVE RODS FEW GRAM POSITIVE COCCI IN CHAINS    Culture   Final    RARE Normal respiratory flora-no Staph aureus or Pseudomonas seen Performed at [facility], [address], [place], [place] [address]    Report Status 12/03/2020 FINAL  Final      Radiology Studies: No results found.   Marzetta Board, MD, PhD [facility]  Between 7 am - 7 pm I am available, please contact me via Amion (for emergencies) or Securechat (non urgent messages)  Between 7 pm - 7 am I am not available, please contact night coverage MD/APP via Amion

## 2020-12-04 NOTE — Plan of Care (Addendum)
Attempted calling patient's brother mutiple times, no one answers the phone, voice mail is full.  Discussed with staff RN today, reports his brother is coming this PM, will notify us when he arrives. Plan for cardiac cath risk versus benefit discussion when patient's brother is available.

## 2020-12-04 NOTE — Procedures (Signed)
Cortrak  Person Inserting Tube:  Ashunti Schofield, RD Tube Type:  Cortrak - 43 inches Tube Size:  10 Tube Location:  Right nare Initial Placement:  Stomach Secured by: Bridle Technique Used to Measure Tube Placement:  Marking at nare/corner of mouth Cortrak Secured At:  75 cm  Consult received to place a Cortrak feeding tube.   X-ray is required, abdominal x-ray has been ordered by the Cortrak team. Please confirm tube placement before using the Cortrak tube.   If the tube becomes dislodged please keep the tube and contact the Cortrak team at [email or web address] (password [email or web address]) for replacement.  If after hours and replacement cannot be delayed, place a NG tube and confirm placement with an abdominal x-ray.   Mariana Single MS, RD, LDN, CNSC Clinical Nutrition Pager listed in [place]

## 2020-12-04 NOTE — Progress Notes (Signed)
Inpatient Rehabilitation Admissions Coordinator   Inpatient rehab consult received. I will follow up Monday to assist with planning dispo when appropriate.  Danne Baxter, RN, MSN Rehab Admissions Coordinator [phone] 12/04/2020 2:21 PM

## 2020-12-04 NOTE — Plan of Care (Signed)

## 2020-12-04 NOTE — Progress Notes (Signed)
Progress Note     Subjective: Still a little confused but alert and oriented to self and situation. He does not remember me from yesterday Continues to have bowel movements. No nausea or emesis. He denies abdominal pain.  Objective: Vital signs in last 24 hours: Temp:  [97.9 F (36.6 C)-99.1 F (37.3 C)] 97.9 F (36.6 C) (09/02 0713) Pulse Rate:  [57-90] 65 (09/02 0700) Resp:  [16-30] 17 (09/02 0700) BP: (109-193)/(61-91) 193/71 (09/02 0700) SpO2:  [92 %-96 %] 94 % (09/02 0700) Weight:  [89.3 kg] 89.3 kg (09/02 0425) Last BM Date: 12/04/20  Intake/Output from previous day: 09/01 0701 - 09/02 0700 In: 2281 [I.V.:879.3; IV Piggyback:1401.7] Out: 2875 [Urine:2875] Intake/Output this shift: No intake/output data recorded.  PE: General: pleasant, WD, male who is laying in bed in NAD HEENT: head is normocephalic, atraumatic. Mouth is pink and moist Heart: Palpable radial and pedal pulses bilaterally Lungs: respiratory effort nonlabored- on room air Abd: soft, NT. Mild distension. Bowel sounds present  MSK: all 4 extremities symmetrical bilaterally without edema Skin: warm and dry with no masses, lesions, or rashes Psych: A&Ox2 with an appropriate affect.    Lab Results:  Recent Labs    12/02/20 0300 12/03/20 0217  WBC 11.1* 11.0*  HGB 11.9* 12.4*  HCT 36.2* 37.1*  PLT 308 340    BMET Recent Labs    12/03/20 2115 12/04/20 0259  NA 138 137  K 4.3 3.3*  CL 106 104  CO2 23 22  GLUCOSE 142* 114*  BUN 9 8  CREATININE 1.12 1.14  CALCIUM 8.0* 7.8*    PT/INR No results for input(s): LABPROT, INR in the last 72 hours. CMP     Component Value Date/Time   NA 137 12/04/2020 0259   K 3.3 (L) 12/04/2020 0259   CL 104 12/04/2020 0259   CO2 22 12/04/2020 0259   GLUCOSE 114 (H) 12/04/2020 0259   BUN 8 12/04/2020 0259   CREATININE 1.14 12/04/2020 0259   CALCIUM 7.8 (L) 12/04/2020 0259   CALCIUM 8.2 (L) 03/26/2014 0556   PROT 4.9 (L) 12/04/2020 0259   ALBUMIN  2.1 (L) 12/04/2020 0259   AST 32 12/04/2020 0259   ALT 28 12/04/2020 0259   ALKPHOS 44 12/04/2020 0259   BILITOT 0.7 12/04/2020 0259   GFRNONAA >60 12/04/2020 0259   GFRAA 41 (L) 08/28/2018 1217   Lipase     Component Value Date/Time   LIPASE 19 11/27/2020 0943       Studies/Results: DG Abd Portable 1V  Result Date: 12/02/2020 CLINICAL DATA:  Diarrhea and emesis, small-bowel obstruction EXAM: PORTABLE ABDOMEN - 1 VIEW COMPARISON:  Abdominal radiographs 11/30/2020, CT abdomen/pelvis 11/29/2020 FINDINGS: There are gas distended loops of small bowel throughout the abdomen measuring up to 4.9 cm in diameter. The appearance is overall similar to the prior radiograph. There is no definite free intraperitoneal air, though evaluation is limited by single supine radiograph. There is no gross organomegaly or abnormal soft tissue calcification. There is degenerative change of the lumbar spine. IMPRESSION: Gas distended loops of small bowel throughout the abdomen measuring up to 4.9 cm, grossly similar to the prior radiograph. Electronically Signed   By: Valetta Mole M.D.   On: 12/02/2020 13:28   [place] EKG SITE RITE  Result Date: 12/03/2020 If Site Rite image not attached, placement could not be confirmed due to current cardiac rhythm.   Anti-infectives: Anti-infectives (From admission, onward)    Start     Dose/Rate Route Frequency Ordered  Stop   12/02/20 1511  ampicillin-sulbactam (UNASYN) 1.5 g in sodium chloride 0.9 % 100 mL IVPB        1.5 g 200 mL/hr over 30 Minutes Intravenous Every 6 hours 12/02/20 1324 12/04/20 2359   12/01/20 1630  ampicillin-sulbactam (UNASYN) 1.5 g in sodium chloride 0.9 % 100 mL IVPB  Status:  Discontinued        1.5 g 200 mL/hr over 30 Minutes Intravenous Every 6 hours 12/01/20 1531 12/01/20 1534   12/01/20 1630  ampicillin-sulbactam (UNASYN) 1.5 g in sodium chloride 0.9 % 100 mL IVPB  Status:  Discontinued        1.5 g 200 mL/hr over 30 Minutes Intravenous Every  8 hours 12/01/20 1534 12/02/20 1324   11/30/20 1000  vancomycin (VANCOREADY) IVPB 750 mg/150 mL  Status:  Discontinued        750 mg 150 mL/hr over 60 Minutes Intravenous Every 12 hours 11/30/20 0043 12/01/20 0937   11/30/20 0130  vancomycin (VANCOREADY) IVPB 1500 mg/300 mL        1,500 mg 150 mL/hr over 120 Minutes Intravenous  Once 11/30/20 0043 11/30/20 0511   11/30/20 0130  ceFEPIme (MAXIPIME) 2 g in sodium chloride 0.9 % 100 mL IVPB  Status:  Discontinued        2 g 200 mL/hr over 30 Minutes Intravenous Every 12 hours 11/30/20 0043 12/01/20 0937        Assessment/Plan SBO - OGT out 8/30 with extubation. Repeated failed NG placement attempts in setting of h/o nose surgery - no nausea/emesis and having bowel movements - from bowel function standpoint he can continue to advance his diet starting with clear liquid diet and advancing to full liquids today as tolerated pending continued clearance/eval from SLP standpoint.  - given duration of NPO status would still recommend cortrak/Tfs to supplement nutrition while his diet is being advanced  FEN: clear liquid diet ID: [email or web address] VTE: heparin gtt  Bradycardic arrest Acute hypoxic respiratory failure Aspiration pneumonia AKI  DM2    LOS: 6 days    Winferd Humphrey, [facility] Surgery 12/04/2020, 8:59 AM Please see Amion for pager number during day hours 7:00am-4:30pm

## 2020-12-04 NOTE — Progress Notes (Signed)
  Speech Language Pathology Treatment: Dysphagia  Patient Details Name: Albert Garrison MRN: [id number] DOB: 04/17/1945 Today's Date: 12/04/2020 Time: 1726-1750 SLP Time Calculation (min) (ACUTE ONLY): 24 min  Assessment / Plan / Recommendation Clinical Impression  Pt was seen for dysphagia treatment. Pt's RN reported that the pt has been demonstrating coughing with p.o. intake, but that coughing has also been noted at baseline. Pt stated that he has been symptomatic of dysphagia prior to admission. Pt's family arrived during the session and denied consistent symptoms of dysphagia, but stated that the pt has been coughing "like he has allergies". Pt demonstrated inconsistent coughing with thin liquids via cup and straw and with dual consistency boluses. Pt tolerated 1/2 tsp boluses of puree and nectar thick liquids without overt s/sx of aspiration. Mastication was prolonged, but oral clearance was adequate. Secondary swallows were noted with purees, suggesting possible pharyngeal residue. Pt was advanced to clear liquids since yesterday's session. Considering his current presentation, a puree diet with nectar thick liquids is recommended at this time. SLP will continue to follow pt and complete MBS if symptoms persist.    HPI HPI: 75 y.o. male presented to [facility] ED with diarrhea, decreased p.o. intake and nausea. Pt admitted with acute encephalopathy and symptomatic hyponatremia. Abdominal distension 8/27. CT abd/pelvis (8/28) (+) SBO and NGT placed. 8/28 pt went into cardiac arrest, concern for possible STEMI. Transferred to [facility] for further treatment. CT head (8/28) revealed no acute intracranial abnormality. Chest xray (8/30) noted "appearance of airspace disease in the right middle lobe. Small left pleural effusion". Hosptial admission complicated by continued impulsivity and agitation with pt pulling out multiple NGT's despite restraints.  ETT: 8/28-8/30. PMHx significant for CKD, BPH, DMII, Hx of CVA,  HTN, and GERD. SLE (08/23/2017) revealed cognitive communication deficits post CVA.      SLP Plan  Continue with current plan of care       Recommendations  Diet recommendations: Dysphagia 1 (puree);Nectar-thick liquid Liquids provided via: Cup;Straw Medication Administration: Via alternative means Compensations: Slow rate;Minimize environmental distractions (individual swallows; avoid consecutive swallows)                Oral Care Recommendations: Staff/trained caregiver to provide oral care;Oral care BID Follow up Recommendations: Other (comment) (TBD) SLP Visit Diagnosis: Dysphagia, unspecified (R13.10) Plan: Continue with current plan of care       Donnamarie Shankles I. Hardin Negus, [place], [place] Office number [phone] Pager [phone]                 Albert Garrison 12/04/2020, 6:00 PM

## 2020-12-04 NOTE — Progress Notes (Signed)
         Chart reviewed. Patient assessed.   No acute distress noted. Patient remains pleasantly confused. Alert to self only. No family at the bedside.   Attempts made to reach brother at all documented numbers. Unable to reach or leave message. I also attempted to contact patient's aunt, [name] and unable to reach.   From previous discussions with patient and his aunt, goals will remain to continue with all treatments, full scope care until able to further discuss with patient's brother/decision maker or patient's mentation improves.   Plan -Continue current plan of care -PMT will continue to support and follow. Will continue attempts to reach brother for ongoing goals of care. Please call with urgent needs  Time Total: 20 min.   Visit consisted of counseling and education dealing with the complex and emotionally intense issues of symptom management and palliative care in the setting of serious and potentially life-threatening illness.Greater than 50%  of this time was spent counseling and coordinating care related to the above assessment and plan.  Alda Lea, AGPCNP-BC  Palliative Medicine Team [phone]

## 2020-12-04 NOTE — Progress Notes (Signed)
1000- patient complaints of aching right sided chest pain 5/10 with subjective shortness of breath.  Room Air with saturations >95% RR 18 EKG performed - NSR Gherghe MD and Hochrein MD aware.

## 2020-12-04 NOTE — Progress Notes (Addendum)
 Progress Note  Patient Name: Albert Garrison Date of Encounter: 12/04/2020  Primary Cardiologist:   Branch, Jonathan, MD   Subjective   He has vague symptoms of chest pain but he is tangential and not answering questions very well.  No distress or obvious pain.    Inpatient Medications    Scheduled Meds:  bisacodyl  10 mg Rectal QHS   chlorhexidine gluconate (MEDLINE KIT)  15 mL Mouth Rinse BID   Chlorhexidine Gluconate Cloth  6 each Topical Daily   enoxaparin (LOVENOX) injection  40 mg Subcutaneous Q24H   insulin aspart  0-20 Units Subcutaneous Q4H   mouth rinse  15 mL Mouth Rinse q12n4p   sodium chloride flush  3 mL Intravenous Q12H   sodium chloride flush  3 mL Intravenous Q12H   Continuous Infusions:  sodium chloride Stopped (12/03/20 0511)   sodium chloride 10 mL/hr at 12/04/20 0600   sodium chloride Stopped (11/30/20 0009)   amiodarone 30 mg/hr (12/04/20 0628)   ampicillin-sulbactam (UNASYN) IV 1.5 g (12/04/20 0838)   PRN Meds: sodium chloride, acetaminophen **OR** acetaminophen, bisacodyl, hydrALAZINE, ondansetron **OR** ondansetron (ZOFRAN) IV, sodium chloride flush   Vital Signs    Vitals:   12/04/20 0713 12/04/20 0800 12/04/20 0900 12/04/20 1000  BP:  (!) 168/74 (!) 165/80 (!) 181/94  Pulse:  64 63 67  Resp:  (!) 25 (!) 28 (!) 26  Temp: 97.9 F (36.6 C)     TempSrc: Oral     SpO2:  94% 94% 94%  Weight:      Height:        Intake/Output Summary (Last 24 hours) at 12/04/2020 1031 Last data filed at 12/04/2020 1000 Gross per 24 hour  Intake 2281.04 ml  Output 3275 ml  Net -993.96 ml   Filed Weights   12/02/20 0500 12/03/20 0410 12/04/20 0425  Weight: 91.5 kg 95 kg 89.3 kg    Telemetry    NSR, PVCs, brief runs of NSVT- Personally Reviewed  ECG    NSR, no acute ST T wave changes.  - Personally Reviewed  Physical Exam   GENERAL:  Well appearing NECK:  No jugular venous distention, waveform within normal limits, carotid upstroke brisk and  symmetric, no bruits, no thyromegaly LUNGS:  Clear to auscultation bilaterally CHEST:  Unremarkable HEART:  PMI not displaced or sustained,S1 and S2 within normal limits, no S3, no S4, no clicks, no rubs, no murmurs ABD:  Flat, positive bowel sounds normal in frequency in pitch, no bruits, no rebound, no guarding, no midline pulsatile mass, no hepatomegaly, no splenomegaly EXT:  2 plus pulses throughout, no edema, no cyanosis no clubbing   Labs    Chemistry Recent Labs  Lab 11/30/20 0224 12/01/20 0410 12/01/20 0521 12/01/20 0540 12/02/20 0300 12/03/20 0217 12/03/20 2115 12/04/20 0259  NA 130* 133*   < > 133*   < > 139 138 137  K 4.5 3.6   < > 3.4*   < > 3.5 4.3 3.3*  CL 100 105  --  105   < > 107 106 104  CO2 21* 19*  --  20*   < > 20* 23 22  GLUCOSE 242* 209*  --  199*   < > 152* 142* 114*  BUN 22 22  --  23   < > 11 9 8  CREATININE 1.30* 1.33*  --  1.27*   < > 1.24 1.12 1.14  CALCIUM 7.3* 7.4*  --  7.3*   < >   7.5* 8.0* 7.8*  PROT 5.6*  --   --  4.1*  --   --   --  4.9*  ALBUMIN 2.7* 1.9*  --  1.9*  --   --   --  2.1*  AST 32  --   --  21  --   --   --  32  ALT 21  --   --  17  --   --   --  28  ALKPHOS 44  --   --  33*  --   --   --  44  BILITOT 1.4*  --   --  1.0  --   --   --  0.7  GFRNONAA 57* 55*  --  59*   < > >60 >60 >60  ANIONGAP 9 9  --  8   < > 12 9 11   < > = values in this interval not displayed.     Hematology Recent Labs  Lab 12/01/20 0410 12/01/20 0521 12/02/20 0300 12/03/20 0217  WBC 10.8*  --  11.1* 11.0*  RBC 3.93*  --  3.95* 4.12*  HGB 11.9* 10.9* 11.9* 12.4*  HCT 35.8* 32.0* 36.2* 37.1*  MCV 91.1  --  91.6 90.0  MCH 30.3  --  30.1 30.1  MCHC 33.2  --  32.9 33.4  RDW 13.7  --  13.9 14.1  PLT 306  --  308 340    Cardiac EnzymesNo results for input(s): TROPONINI in the last 168 hours. No results for input(s): TROPIPOC in the last 168 hours.   BNPNo results for input(s): BNP, PROBNP in the last 168 hours.   DDimer No results for input(s):  DDIMER in the last 168 hours.   Radiology    US EKG SITE RITE  Result Date: 12/03/2020 If Site Rite image not attached, placement could not be confirmed due to current cardiac rhythm.   Cardiac Studies   ECHO:   1. Left ventricular ejection fraction, by estimation, is 65 to 70%. The  left ventricle has normal function. The left ventricle has no regional  wall motion abnormalities. There is mild left ventricular hypertrophy.  Left ventricular diastolic parameters  are indeterminate.   2. Right ventricular systolic function is normal. The right ventricular  size is normal. Tricuspid regurgitation signal is inadequate for assessing  PA pressure.   3. The mitral valve is grossly normal.   4. Aortic valve regurgitation is mild.   5. Aortic dilatation noted. There is mild dilatation of the aortic root,  measuring 42 mm.   Patient Profile     75 y.o. male with a hx of CVA 2019, NSVT, DM2, hyponatremia, hypertension, and CKD stage II who is being seen 11/30/2020 for the evaluation of reported cardiac arrest at the request of Dr.Emokpae  Assessment & Plan    Cardiac arrest:    Plan cardiac cath and we will tentatively put him on the board for cath on Tuesday.  However, his brother likely will need to participate in consent.    (Tuesday Dr. Berry 9am)   PVCs:   I am going to stop the IV amiodarone.  I will again supplement potassium and mag.  He was taken off of beta blocker but I am going to try to restart a low dose via tube.    Acute respiratory failure:  Extubated 8/30.    CKD stage III:     Creat is down.   SBO:     Gut   seems to be working now and will be able to receive meds via tube.   For questions or updates, please contact [place] Please consult [email or web address] for contact info under Cardiology/STEMI.   Signed, Minus Breeding, MD  12/04/2020, 10:31 AM

## 2020-12-04 NOTE — Progress Notes (Signed)
Social worker and MD notified of patient trying to leave AMA.  Patient states his "nerves" were making him restless and felt like he was "trapped".  PIVx2 self removed, catheter intact.  See new orders

## 2020-12-04 NOTE — Progress Notes (Signed)
Nutrition Follow-up  DOCUMENTATION CODES:   Not applicable  INTERVENTION:   Initiate tube feeds via Cortrak tube: - Start Osmolite 1.5 @ 20 ml/hr and advance by 10 ml q 8 hours to goal rate of 60 ml/hr (1440 ml/day) - ProSource TF 45 ml BID  Tube feeding regimen at goal provides 2240 kcal, 112 grams of protein, and 1097 ml of H2O.   NUTRITION DIAGNOSIS:   Inadequate oral intake related to inability to eat as evidenced by NPO status.  Ongoing, pt now on clear liquid diet with minimal PO intake  GOAL:   Patient will meet greater than or equal to 90% of their needs  Progressing with initiation of tube feeds  MONITOR:   PO intake, Diet advancement, Labs, Weight trends, TF tolerance, I & O's  REASON FOR ASSESSMENT:   Ventilator    ASSESSMENT:   75 year old male with PMH of HTN, T2DM, GERD, HOH, hyponatremia, CKD stage II, depression, CVA, OSA, GERD. Pt presented with SBO, acute encephalopathy. Pt required intubation after brief cardiac arrest and initiation of pressor support.  8/28 - intubated 8/30 - extubated 9/02 - diet advanced to clear liquids, Cortrak placed (tip gastric)  Discussed pt with RN and during ICU rounds. Pt currently on clear liquid diet with minimal PO intake. Pt working with SLP on diet advancement.   Noted Surgery has signed off with recommendations to advance diet as tolerated but if PO intake is poor or swallowing is limited, may need to supplement with tube feeds. Cortrak placed today and tube feeds to start at trickle rate and slowly advance to goal. RN aware.  Pt remains altered but less so than previous days.  Admit weight: 87 kg Current weight: 89.3 kg  Medications reviewed and include: dulcolax, SSI q 4 hours, amiodarone drip, IV abx, IV potassium chloride 10 mEq x 4 runs  Labs reviewed: potassium 3.3 CBG's: 108-170 x 24 hours  UOP: 2875 ml x 24 hours I/O's: +7.6 L since admit  NUTRITION - FOCUSED PHYSICAL EXAM:  Flowsheet Row Most  Recent Value  Orbital Region Mild depletion  Upper Arm Region No depletion  Thoracic and Lumbar Region No depletion  Buccal Region Mild depletion  Temple Region Mild depletion  Clavicle Bone Region Moderate depletion  Clavicle and Acromion Bone Region Moderate depletion  Scapular Bone Region Mild depletion  Dorsal Hand Mild depletion  Patellar Region No depletion  Anterior Thigh Region Mild depletion  Posterior Calf Region No depletion  Edema (RD Assessment) None  Hair Reviewed  Eyes Reviewed  Mouth Reviewed  Skin Reviewed  Nails Reviewed       Diet Order:   Diet Order             Diet clear liquid Room service appropriate? Yes; Fluid consistency: Thin  Diet effective now                   EDUCATION NEEDS:   No education needs have been identified at this time  Skin:  Skin Assessment: Reviewed RN Assessment  Last BM:  12/04/20 small type 6  Height:   Ht Readings from Last 1 Encounters:  11/29/20 '5\' 11"'$  (1.803 m)    Weight:   Wt Readings from Last 1 Encounters:  12/04/20 89.3 kg    BMI:  Body mass index is 27.46 kg/m.  Estimated Nutritional Needs:   Kcal:  2100-2300  Protein:  110-130 grams  Fluid:  >/= 2.0 L since admit    Gustavus Bryant, MS, RD,  LDN Inpatient Clinical Dietitian Please see AMiON for contact information.
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
## 2020-12-05 ENCOUNTER — Inpatient Hospital Stay (HOSPITAL_COMMUNITY): Payer: Medicare Other

## 2020-12-05 DIAGNOSIS — K56609 Unspecified intestinal obstruction, unspecified as to partial versus complete obstruction: Secondary | ICD-10-CM | POA: Diagnosis not present

## 2020-12-05 DIAGNOSIS — E871 Hypo-osmolality and hyponatremia: Secondary | ICD-10-CM | POA: Diagnosis not present

## 2020-12-05 DIAGNOSIS — I469 Cardiac arrest, cause unspecified: Secondary | ICD-10-CM | POA: Diagnosis not present

## 2020-12-05 DIAGNOSIS — G9340 Encephalopathy, unspecified: Secondary | ICD-10-CM | POA: Diagnosis not present

## 2020-12-05 DIAGNOSIS — J9601 Acute respiratory failure with hypoxia: Secondary | ICD-10-CM | POA: Diagnosis not present

## 2020-12-05 LAB — BASIC METABOLIC PANEL WITH GFR
Anion gap: 7 (ref 5–15)
BUN: 17 mg/dL (ref 8–23)
CO2: 24 mmol/L (ref 22–32)
Calcium: 8.2 mg/dL — ABNORMAL LOW (ref 8.9–10.3)
Chloride: 105 mmol/L (ref 98–111)
Creatinine, Ser: 1.19 mg/dL (ref 0.61–1.24)
GFR, Estimated: >60 mL/min (ref >60)
Glucose, Bld: 234 mg/dL — ABNORMAL HIGH (ref 70–99)
Potassium: 4 mmol/L (ref 3.5–5.1)
Sodium: 136 mmol/L (ref 135–145)

## 2020-12-05 LAB — GLUCOSE, CAPILLARY
Glucose-Capillary: 165 mg/dL — ABNORMAL HIGH (ref 70–99)
Glucose-Capillary: 168 mg/dL — ABNORMAL HIGH (ref 70–99)
Glucose-Capillary: 173 mg/dL — ABNORMAL HIGH (ref 70–99)
Glucose-Capillary: 261 mg/dL — ABNORMAL HIGH (ref 70–99)
Glucose-Capillary: 179 mg/dL — ABNORMAL HIGH (ref 70–99)
Glucose-Capillary: 208 mg/dL — ABNORMAL HIGH (ref 70–99)

## 2020-12-05 LAB — CULTURE, BLOOD (ROUTINE X 2)
Culture: NO GROWTH
Culture: NO GROWTH
Special Requests: ADEQUATE
Special Requests: ADEQUATE

## 2020-12-05 LAB — MAGNESIUM
Magnesium: 1.9 mg/dL (ref 1.7–2.4)
Magnesium: 2 mg/dL (ref 1.7–2.4)

## 2020-12-05 LAB — PHOSPHORUS
Phosphorus: 2.6 mg/dL (ref 2.5–4.6)
Phosphorus: 3.3 mg/dL (ref 2.5–4.6)

## 2020-12-05 MED ORDER — GADOBUTROL 1 MMOL/ML IV SOLN
10.0000 mL | Freq: Once | INTRAVENOUS | Status: AC | PRN
  Administered 2020-12-05: 10 mL via INTRAVENOUS

## 2020-12-05 NOTE — Progress Notes (Signed)
Progress Note  Patient Name: Albert Garrison Date of Encounter: 12/05/2020  [facility] Cardiologist: Carlyle Dolly, MD   Subjective   Currently seems quite comfortable.  No chest pain no shortness of breath.  NG tube in place.  States that he feels well as long as he is here.  Pleasant.  Inpatient Medications    Scheduled Meds:  bisacodyl  10 mg Rectal QHS   chlorhexidine gluconate (MEDLINE KIT)  15 mL Mouth Rinse BID   Chlorhexidine Gluconate Cloth  6 each Topical Daily   enoxaparin (LOVENOX) injection  40 mg Subcutaneous Q24H   feeding supplement (PROSource TF)  45 mL Per Tube BID   insulin aspart  0-20 Units Subcutaneous Q4H   mouth rinse  15 mL Mouth Rinse q12n4p   metoprolol tartrate  12.5 mg Per Tube BID   sodium chloride flush  3 mL Intravenous Q12H   sodium chloride flush  3 mL Intravenous Q12H   Continuous Infusions:  sodium chloride Stopped (12/03/20 0511)   sodium chloride Stopped (12/04/20 1802)   sodium chloride Stopped (11/30/20 0009)   feeding supplement (OSMOLITE 1.5 CAL) 40 mL/hr at 12/05/20 0902   PRN Meds: sodium chloride, acetaminophen **OR** acetaminophen, bisacodyl, guaiFENesin-dextromethorphan, LORazepam, menthol-cetylpyridinium, ondansetron **OR** ondansetron (ZOFRAN) IV, sodium chloride flush   Vital Signs    Vitals:   12/05/20 0030 12/05/20 0325 12/05/20 0440 12/05/20 0720  BP: (!) 147/73  (!) 142/76 (!) 141/97  Pulse: 79  79   Resp:    (!) 21  Temp: (!) 97.3 F (36.3 C)  98.3 F (36.8 C) 98.2 F (36.8 C)  TempSrc: Oral  Oral Oral  SpO2:   96% 96%  Weight:  101.5 kg 101.3 kg   Height:        Intake/Output Summary (Last 24 hours) at 12/05/2020 1009 Last data filed at 12/05/2020 0900 Gross per 24 hour  Intake 901.5 ml  Output 1700 ml  Net -798.5 ml   Last 3 Weights 12/05/2020 12/05/2020 12/04/2020  Weight (lbs) 223 lb 5.2 oz 223 lb 12.3 oz 196 lb 13.9 oz  Weight (kg) 101.3 kg 101.5 kg 89.3 kg      Telemetry    Short intermittent  bursts of PVCs, sometimes 3-5 in succession.  No atrial fibrillation, no pauses, no sustained ventricular tachycardia.- Personally Reviewed  ECG    No new- Personally Reviewed  Physical Exam   GEN: No acute distress.   Neck: No JVD Cardiac: RRR, no murmurs, rubs, or gallops.  Respiratory: Clear to auscultation bilaterally. GI: Soft, nontender, non-distended  MS: No edema; No deformity. Neuro:  Nonfocal, pleasant, potentially mildly confused Psych: Normal affect   Labs    High Sensitivity Troponin:   Recent Labs  Lab 11/29/20 2243 11/30/20 0224 11/30/20 0503 11/30/20 0730  TROPONINIHS 116* 263* 306* 285*      Chemistry Recent Labs  Lab 11/30/20 0224 12/01/20 0410 12/01/20 0521 12/01/20 0540 12/02/20 0300 12/03/20 0217 12/03/20 2115 12/04/20 0259 12/04/20 1821  NA 130* 133*   < > 133*   < > 139 138 137  --   K 4.5 3.6   < > 3.4*   < > 3.5 4.3 3.3* 3.8  CL 100 105  --  105   < > 107 106 104  --   CO2 21* 19*  --  20*   < > 20* 23 22  --   GLUCOSE 242* 209*  --  199*   < > 152* 142* 114*  --  BUN 22 22  --  23   < > $R'11 9 8  'jT$ --   CREATININE 1.30* 1.33*  --  1.27*   < > 1.24 1.12 1.14  --   CALCIUM 7.3* 7.4*  --  7.3*   < > 7.5* 8.0* 7.8*  --   PROT 5.6*  --   --  4.1*  --   --   --  4.9*  --   ALBUMIN 2.7* 1.9*  --  1.9*  --   --   --  2.1*  --   AST 32  --   --  21  --   --   --  32  --   ALT 21  --   --  17  --   --   --  28  --   ALKPHOS 44  --   --  33*  --   --   --  44  --   BILITOT 1.4*  --   --  1.0  --   --   --  0.7  --   GFRNONAA 57* 55*  --  59*   < > >60 >60 >60  --   ANIONGAP 9 9  --  8   < > $R'12 9 11  'Pf$ --    < > = values in this interval not displayed.     Hematology Recent Labs  Lab 12/01/20 0410 12/01/20 0521 12/02/20 0300 12/03/20 0217  WBC 10.8*  --  11.1* 11.0*  RBC 3.93*  --  3.95* 4.12*  HGB 11.9* 10.9* 11.9* 12.4*  HCT 35.8* 32.0* 36.2* 37.1*  MCV 91.1  --  91.6 90.0  MCH 30.3  --  30.1 30.1  MCHC 33.2  --  32.9 33.4  RDW 13.7   --  13.9 14.1  PLT 306  --  308 340    BNPNo results for input(s): BNP, PROBNP in the last 168 hours.   DDimer No results for input(s): DDIMER in the last 168 hours.   Radiology    DG Abd Portable 1V  Result Date: 12/04/2020 CLINICAL DATA:  Feeding tube placement. EXAM: PORTABLE ABDOMEN - 1 VIEW COMPARISON:  December 02, 2020. FINDINGS: The bowel gas pattern is normal. Distal tip of feeding tube is seen in expected position of distal stomach. No radio-opaque calculi or other significant radiographic abnormality are seen. IMPRESSION: Distal tip of feeding tube seen in expected position of distal stomach. Electronically Signed   By: Marijo Conception M.D.   On: 12/04/2020 11:16   [place] EKG SITE RITE  Result Date: 12/03/2020 If Site Rite image not attached, placement could not be confirmed due to current cardiac rhythm.   Cardiac Studies   Echo with EF 65 to 70%  Patient Profile     75 y.o. male with prior stroke 2019 nonsustained ventricular tachycardia diabetes hyponatremia hypertension chronic kidney disease stage II who was seen originally on 11/30/2020 for the evaluation of reported cardiac arrest  Assessment & Plan    Cardiac arrest nonsustained ventricular tachycardia, reported bradycardia unresponsiveness treated with atropine epinephrine compressions under a minute no shocks delivered - Plan is for cardiac catheterization on Tuesday.  He was placed on the cath board by Dr. Percival Spanish.  Dr. Percival Spanish does state that his brother will likely have to participate in consent.  Tuesday 9 AM Dr. Gwenlyn Found.  PVCs - IV amiodarone has been stopped.  He is being supplemented for potassium and magnesium.  Low-dose beta-blocker  was tried via NG tube.  Acute respiratory failure - Extubated August 30.  Chronic kidney disease stage III - Creatinine decreased.  Improved  Small bowel obstruction - Gut seems to be working better.  Hyponatremia - Admitted with nausea sodium of 118 confusion.  Anoxic  encephalopathy - Confusion was noted post hypoperfusion.      For questions or updates, please contact [place] Please consult [email or web address] for contact info under        Signed, Candee Furbish, MD  12/05/2020, 10:09 AM

## 2020-12-05 NOTE — Progress Notes (Signed)
Nurse paged tiad md d/t pt's vtach and SVT

## 2020-12-05 NOTE — Progress Notes (Signed)
     Chart Reviewed. Updates received. Patient assessed.   Patient awake and alert in bed. Remains confused but is alert to self, year. When asked about location he states he is at [place] [facility] [address] and that he was transferred from [facility]. He verbalized understanding and expressed "oh yeah they told me I was sent here because my heart was bad!"   Although patient remains intermittently confused he does answer some questions appropriately. He is able to share that he lives alone, his brother lives in the [place], and that his son Gerald Stabs and him have been working on Warden/ranger their relationship.   He asks me to call him [name]. States his brother is his primary contact however, he knows he is hard to reach and if needed provides permission to speak with his son "if absolutely necessary".   I attempt to discuss goals of care. He states he would like to continue to receive treatment. States he would not wish to be back on any form of life-support.   Given patient's confusion would need to discuss goals of care further with his brother before making a DNR or can re-discuss once mentation improves.   Assessment -NAD, ill appearing -clear bilaterally -RRR -Alert to self, year, and some history. Mood appropriate, follows commands   Plan -Continue with current plan of care -Patient states he would want to be a DNR, however in the setting of confusion further goals of care with family would be warranted for changes. -Additional attempts to reach brother. PMT will re-attempt Monday if no answer will defer discussions to patient's son.   Time Total: 40 min.   Visit consisted of counseling and education dealing with the complex and emotionally intense issues of symptom management and palliative care in the setting of serious and potentially life-threatening illness.Greater than 50%  of this time was spent counseling and coordinating care related to the above assessment and  plan.  Alda Lea, AGPCNP-BC  Palliative Medicine Team [phone]

## 2020-12-05 NOTE — Progress Notes (Signed)
PROGRESS NOTE  Albert Garrison NGE:[id number] DOB: 1946-02-24 DOA: 11/27/2020 PCP: Celene Squibb, MD   LOS: 7 days   Brief Narrative / Interim history: 75 year old male with history of hypertension, hyperlipidemia, former smoker, DM2, history of lower GI bleed, chronic kidney disease stage II comes into the hospital with nausea, poor p.o. intake and decreased appetite.  He presented initially to [facility] and was admitted 8/26.  He was found to be hyponatremic with a sodium of 118, had acute kidney injury and leukocytosis.  He was confused on admission thought to be secondary to hyponatremia.  Abdominal x-ray on hospital day #2 showed small bowel obstruction, and general surgery was consulted.  CT scan of the abdomen and pelvis showed a transition point in the right mid abdomen.  Hospital course was complicated by increased ectopy on telemetry and eventually had cardiac arrest late PM 8/28 when he became unresponsive and had agonal respirations.  CODE BLUE was called and ROSC was achieved in approximately 5 minutes.  There was concern about new onset left bundle branch block/STEMI and he was started on heparin, amiodarone and cardiology was consulted.  He was transferred to [facility] events 8/26 Admit 8/26 N/V 8/27 KUB with concern for distal SBO. ECHO with LVEF 55-60%, no RWMA, grade I diastolic dysfunction 4/01 CT ABD with small bowel obstruction with likely transition point in the right mid abdomen.  CT Head negative.  8/28 cardiac arrest >>NSVT with somewhat more sustained runs, progressive IVCD by ECG, reportedly bradycardia and unresponsiveness.  He was treated with atropine and epinephrine, had compressions under a minute with ROSC, no shocks delivered.  He was intubated and did require initiation of pressors including Levophed and vasopressin, also started on amiodarone 8/29 Transfer to [facility] for surgical / cardiac ongoing evaluation 8/29 Echo LVEF 65-70%, otherwise  grossly normal.  8/30 extubated  9/1-TRH took over   Subjective / 24h Interval events: Remains altered.  He has no complaints for me, no chest pain, no abdominal pain, no nausea or vomiting but seems to be quite confused  Assessment & Plan: Principal Problem NSVT, bradycardic arrest, concern for LBBB/STEMI-cardiology consulted, following.  This may have been due to multiple electrolyte disturbances versus Precedex related -Cardiology following, recommending continuing amiodarone for now.  He was initially on heparin but now he is on Lovenox subcu for DVT prophylaxis -Cardiology plans to do left heart cath for ischemic evaluation however his mental status needs to be better.  I think the plans are for Tuesday  Active Problems Small bowel obstruction-with concern for transition point on the CT scan of the abdomen and pelvis.  General surgery consulted and followed patient while hospitalized.  He eventually improved with conservative management but failed swallow eval due to altered mental status and now has a core track tube.  Small bowel obstruction resolved and he is now tolerating tube feeds.  Acute metabolic encephalopathy-he has been extubated for a few days and more more alert however persistently confused.  Not sure how the hypoperfusion during the cardiac arrest affected him, will obtain an MRI of the brain with and without -Closely monitor mental status, currently dependent on tube feeds  Acute kidney injury on CKD stage II-Baseline creatinine around 1.2, currently at baseline  Fever- no obvious source. ? Aspiration during arrest. Cultures pending. Very mild leukocytosis.  -He is status post 5 days of Unasyn   Symptomatic Hyponatremia  -Hyponatremia resolved, sodium stable today within normal limits  Hypoxic respiratory failure  in the setting of cardiac arrest-extubated 8/30.  Wean off oxygen as tolerated  DM II - Poorly controlled, admit A1c 8.6.   -SSI   CBG (last 3)   Recent Labs    12/05/20 0431 12/05/20 0720 12/05/20 1107  GLUCAP 168* 173* 261*     HTN - continue schedule metoprolol IV until he can take orals.      Scheduled Meds:  bisacodyl  10 mg Rectal QHS   chlorhexidine gluconate (MEDLINE KIT)  15 mL Mouth Rinse BID   Chlorhexidine Gluconate Cloth  6 each Topical Daily   enoxaparin (LOVENOX) injection  40 mg Subcutaneous Q24H   feeding supplement (PROSource TF)  45 mL Per Tube BID   insulin aspart  0-20 Units Subcutaneous Q4H   mouth rinse  15 mL Mouth Rinse q12n4p   metoprolol tartrate  12.5 mg Per Tube BID   sodium chloride flush  3 mL Intravenous Q12H   sodium chloride flush  3 mL Intravenous Q12H   Continuous Infusions:  sodium chloride Stopped (12/03/20 0511)   sodium chloride Stopped (12/04/20 1802)   sodium chloride Stopped (11/30/20 0009)   feeding supplement (OSMOLITE 1.5 CAL) 40 mL/hr at 12/05/20 0902   PRN Meds:.sodium chloride, acetaminophen **OR** acetaminophen, bisacodyl, guaiFENesin-dextromethorphan, LORazepam, menthol-cetylpyridinium, ondansetron **OR** ondansetron (ZOFRAN) IV, sodium chloride flush  Diet Orders (From admission, onward)     Start     Ordered   12/04/20 1902  DIET - DYS 1 Room service appropriate? Yes with Assist; Fluid consistency: Nectar Thick  Diet effective now       Question Answer Comment  Room service appropriate? Yes with Assist   Fluid consistency: Nectar Thick      12/04/20 1902            DVT prophylaxis: enoxaparin (LOVENOX) injection 40 mg Start: 12/03/20 1600 SCDs Start: 11/27/20 1854 Place TED hose Start: 11/27/20 1854     Code Status: Full Code  Family Communication: No family at bedside  Status is: Inpatient  Remains inpatient appropriate because:Inpatient level of care appropriate due to severity of illness  Dispo: The patient is from: Home              Anticipated d/c is to: SNF              Patient currently is not medically stable to d/c.   Difficult to  place patient No  Level of care: Progressive  Consultants:  PCCM General surgery Cardiology   Procedures:  2D echo:   1. Left ventricular ejection fraction, by estimation, is 65 to 70%. The left ventricle has normal function. The left ventricle has no regional wall motion abnormalities. There is mild left ventricular hypertrophy. Left ventricular diastolic parameters are indeterminate.   2. Right ventricular systolic function is normal. The right ventricular size is normal. Tricuspid regurgitation signal is inadequate for assessing PA pressure.   3. The mitral valve is grossly normal.   4. Aortic valve regurgitation is mild.   5. Aortic dilatation noted. There is mild dilatation of the aortic root, measuring 42 mm.  Microbiology  Respiratory culture 8/30-normal respiratory flora, no staph or Pseudomonas  Antimicrobials: Unasyn    Objective: Vitals:   12/05/20 0325 12/05/20 0440 12/05/20 0720 12/05/20 1125  BP:  (!) 142/76 (!) 141/97 139/74  Pulse:  79  75  Resp:   (!) 21 20  Temp:  98.3 F (36.8 C) 98.2 F (36.8 C) 98.2 F (36.8 C)  TempSrc:  Oral Oral Oral  SpO2:  96% 96% 98%  Weight: 101.5 kg 101.3 kg    Height:        Intake/Output Summary (Last 24 hours) at 12/05/2020 1334 Last data filed at 12/05/2020 1207 Gross per 24 hour  Intake 1125.5 ml  Output 2624 ml  Net -1498.5 ml    Filed Weights   12/04/20 0425 12/05/20 0325 12/05/20 0440  Weight: 89.3 kg 101.5 kg 101.3 kg    Examination:  Constitutional: No distress Eyes: Anicteric ENMT: Moist mucous membranes Neck: normal, supple Respiratory: Clear bilaterally without wheezing or crackles, normal respiratory effort Cardiovascular: Regular rate and rhythm, no murmurs, no edema Abdomen: Soft, NT, ND, bowel sounds positive Musculoskeletal: no clubbing / cyanosis.  Skin: No rashes seen Neurologic: Alert to self, no focal deficits  Data Reviewed: I have independently reviewed following labs and imaging  studies   CBC: Recent Labs  Lab 11/29/20 2243 11/30/20 0225 12/01/20 0410 12/01/20 0521 12/02/20 0300 12/03/20 0217  WBC 15.8* 12.9* 10.8*  --  11.1* 11.0*  HGB 14.7 13.6 11.9* 10.9* 11.9* 12.4*  HCT 44.5 41.8 35.8* 32.0* 36.2* 37.1*  MCV 93.7 93.7 91.1  --  91.6 90.0  PLT 415* 377 306  --  308 741    Basic Metabolic Panel: Recent Labs  Lab 12/01/20 0540 12/02/20 0300 12/02/20 2000 12/03/20 0217 12/03/20 2115 12/04/20 0259 12/04/20 1821 12/05/20 0431  NA 133* 138  --  139 138 137  --   --   K 3.4* 3.5  --  3.5 4.3 3.3* 3.8  --   CL 105 105  --  107 106 104  --   --   CO2 20* 20*  --  20* 23 22  --   --   GLUCOSE 199* 131*  --  152* 142* 114*  --   --   BUN 23 16  --  $R'11 9 8  'aF$ --   --   CREATININE 1.27* 1.27*  --  1.24 1.12 1.14  --   --   CALCIUM 7.3* 7.7*  --  7.5* 8.0* 7.8*  --   --   MG  --  2.4  --  2.0  --  1.9 2.1 1.9  PHOS  --  1.3*   < > 1.5* 2.2* 2.5 2.1* 2.6   < > = values in this interval not displayed.    Liver Function Tests: Recent Labs  Lab 11/29/20 0023 11/30/20 0224 12/01/20 0410 12/01/20 0540 12/04/20 0259  AST  --  32  --  21 32  ALT  --  21  --  17 28  ALKPHOS  --  44  --  33* 44  BILITOT  --  1.4*  --  1.0 0.7  PROT  --  5.6*  --  4.1* 4.9*  ALBUMIN 3.2* 2.7* 1.9* 1.9* 2.1*    Coagulation Profile: No results for input(s): INR, PROTIME in the last 168 hours. HbA1C: No results for input(s): HGBA1C in the last 72 hours. CBG: Recent Labs  Lab 12/04/20 1920 12/05/20 0130 12/05/20 0431 12/05/20 0720 12/05/20 1107  GLUCAP 153* 165* 168* 173* 261*     Recent Results (from the past 240 hour(s))  Resp Panel by RT-PCR (Flu A&B, Covid) Nasopharyngeal Swab     Status: None   Collection Time: 11/27/20  9:16 AM   Specimen: Nasopharyngeal Swab; Nasopharyngeal(NP) swabs in vial transport medium  Result Value Ref Range Status   SARS Coronavirus 2 by RT PCR NEGATIVE NEGATIVE Final  Comment: (NOTE) SARS-CoV-2 target nucleic acids are  NOT DETECTED.  The SARS-CoV-2 RNA is generally detectable in upper respiratory specimens during the acute phase of infection. The lowest concentration of SARS-CoV-2 viral copies this assay can detect is 138 copies/mL. A negative result does not preclude SARS-Cov-2 infection and should not be used as the sole basis for treatment or other patient management decisions. A negative result may occur with  improper specimen collection/handling, submission of specimen other than nasopharyngeal swab, presence of viral mutation(s) within the areas targeted by this assay, and inadequate number of viral copies(<138 copies/mL). A negative result must be combined with clinical observations, patient history, and epidemiological information. The expected result is Negative.  Fact Sheet for Patients:  EntrepreneurPulse.com.au  Fact Sheet for Healthcare Providers:  IncredibleEmployment.be  This test is no t yet approved or cleared by the [place] FDA and  has been authorized for detection and/or diagnosis of SARS-CoV-2 by FDA under an Emergency Use Authorization (EUA). This EUA will remain  in effect (meaning this test can be used) for the duration of the COVID-19 declaration under Section 564(b)(1) of the Act, 21 U.S.C.section 360bbb-3(b)(1), unless the authorization is terminated  or revoked sooner.       Influenza A by PCR NEGATIVE NEGATIVE Final   Influenza B by PCR NEGATIVE NEGATIVE Final    Comment: (NOTE) The Xpert Xpress SARS-CoV-2/FLU/RSV plus assay is intended as an aid in the diagnosis of influenza from Nasopharyngeal swab specimens and should not be used as a sole basis for treatment. Nasal washings and aspirates are unacceptable for Xpert Xpress SARS-CoV-2/FLU/RSV testing.  Fact Sheet for Patients: EntrepreneurPulse.com.au  Fact Sheet for Healthcare Providers: IncredibleEmployment.be  This test is not  yet approved or cleared by the [place] FDA and has been authorized for detection and/or diagnosis of SARS-CoV-2 by FDA under an Emergency Use Authorization (EUA). This EUA will remain in effect (meaning this test can be used) for the duration of the COVID-19 declaration under Section 564(b)(1) of the Act, 21 U.S.C. section 360bbb-3(b)(1), unless the authorization is terminated or revoked.  Performed at [facility], [address] [place], [place] [address]   MRSA Next Gen by PCR, Nasal     Status: None   Collection Time: 11/27/20 12:15 PM   Specimen: Nasal Mucosa; Nasal Swab  Result Value Ref Range Status   MRSA by PCR Next Gen NOT DETECTED NOT DETECTED Final    Comment: (NOTE) The GeneXpert MRSA Assay (FDA approved for NASAL specimens only), is one component of a comprehensive MRSA colonization surveillance program. It is not intended to diagnose MRSA infection nor to guide or monitor treatment for MRSA infections. Test performance is not FDA approved in patients less than 96 years old. Performed at [facility], [address], [place], [place] [address]   Culture, blood (Routine X 2) w Reflex to ID Panel     Status: None   Collection Time: 11/30/20 12:35 PM   Specimen: Right Antecubital; Blood  Result Value Ref Range Status   Specimen Description   Final    RIGHT ANTECUBITAL BOTTLES DRAWN AEROBIC AND ANAEROBIC   Special Requests Blood Culture adequate volume  Final   Culture   Final    NO GROWTH 5 DAYS Performed at [facility], [address], [place], [place] [address]    Report Status 12/05/2020 FINAL  Final  Culture, blood (Routine X 2) w Reflex to ID Panel     Status: None   Collection Time: 11/30/20  12:35 PM   Specimen: BLOOD LEFT HAND  Result Value Ref Range Status   Specimen Description   Final    BLOOD LEFT HAND BOTTLES DRAWN AEROBIC AND ANAEROBIC   Special Requests Blood Culture adequate volume  Final   Culture   Final    NO GROWTH 5 DAYS Performed at  [facility], [address], [place], [place] [address]    Report Status 12/05/2020 FINAL  Final  Culture, Respiratory w Gram Stain     Status: None   Collection Time: 12/01/20  8:30 AM   Specimen: Tracheal Aspirate  Result Value Ref Range Status   Specimen Description TRACHEAL ASPIRATE  Final   Special Requests NONE  Final   Gram Stain   Final    ABUNDANT WBC PRESENT, PREDOMINANTLY PMN ABUNDANT GRAM NEGATIVE RODS FEW GRAM POSITIVE COCCI IN CHAINS    Culture   Final    RARE Normal respiratory flora-no Staph aureus or Pseudomonas seen Performed at [facility], [address] [place], [place] [address]    Report Status 12/03/2020 FINAL  Final      Radiology Studies: No results found.   Marzetta Board, MD, PhD [facility]  Between 7 am - 7 pm I am available, please contact me via Amion (for emergencies) or Securechat (non urgent messages)  Between 7 pm - 7 am I am not available, please contact night coverage MD/APP via Amion

## 2020-12-05 NOTE — Plan of Care (Signed)
  Problem: Nutrition: Goal: Adequate nutrition will be maintained Outcome: Progressing   Problem: Elimination: Goal: Will not experience complications related to bowel motility Outcome: Progressing Goal: Will not experience complications related to urinary retention Outcome: Progressing   

## 2020-12-06 ENCOUNTER — Inpatient Hospital Stay (HOSPITAL_COMMUNITY): Payer: Medicare Other

## 2020-12-06 DIAGNOSIS — J9601 Acute respiratory failure with hypoxia: Secondary | ICD-10-CM | POA: Diagnosis not present

## 2020-12-06 DIAGNOSIS — I469 Cardiac arrest, cause unspecified: Secondary | ICD-10-CM | POA: Diagnosis not present

## 2020-12-06 DIAGNOSIS — K56609 Unspecified intestinal obstruction, unspecified as to partial versus complete obstruction: Secondary | ICD-10-CM | POA: Diagnosis not present

## 2020-12-06 LAB — CBC
HCT: 36.2 % — ABNORMAL LOW (ref 39.0–52.0)
Hemoglobin: 12 g/dL — ABNORMAL LOW (ref 13.0–17.0)
MCH: 29.8 pg (ref 26.0–34.0)
MCHC: 33.1 g/dL (ref 30.0–36.0)
MCV: 89.8 fL (ref 80.0–100.0)
Platelets: 364 10*3/uL (ref 150–400)
RBC: 4.03 MIL/uL — ABNORMAL LOW (ref 4.22–5.81)
RDW: 14.2 % (ref 11.5–15.5)
WBC: 10.9 10*3/uL — ABNORMAL HIGH (ref 4.0–10.5)
nRBC: 0 % (ref 0.0–0.2)

## 2020-12-06 LAB — MAGNESIUM: Magnesium: 2 mg/dL (ref 1.7–2.4)

## 2020-12-06 LAB — COMPREHENSIVE METABOLIC PANEL
ALT: 31 U/L (ref 0–44)
AST: 33 U/L (ref 15–41)
Albumin: 2.1 g/dL — ABNORMAL LOW (ref 3.5–5.0)
Alkaline Phosphatase: 42 U/L (ref 38–126)
Anion gap: 7 (ref 5–15)
BUN: 15 mg/dL (ref 8–23)
CO2: 27 mmol/L (ref 22–32)
Calcium: 8.2 mg/dL — ABNORMAL LOW (ref 8.9–10.3)
Chloride: 104 mmol/L (ref 98–111)
Creatinine, Ser: 1.17 mg/dL (ref 0.61–1.24)
GFR, Estimated: >60 mL/min (ref >60)
Glucose, Bld: 112 mg/dL — ABNORMAL HIGH (ref 70–99)
Potassium: 3.7 mmol/L (ref 3.5–5.1)
Sodium: 138 mmol/L (ref 135–145)
Total Bilirubin: 0.5 mg/dL (ref 0.3–1.2)
Total Protein: 5 g/dL — ABNORMAL LOW (ref 6.5–8.1)

## 2020-12-06 LAB — GLUCOSE, CAPILLARY
Glucose-Capillary: 109 mg/dL — ABNORMAL HIGH (ref 70–99)
Glucose-Capillary: 125 mg/dL — ABNORMAL HIGH (ref 70–99)
Glucose-Capillary: 167 mg/dL — ABNORMAL HIGH (ref 70–99)
Glucose-Capillary: 179 mg/dL — ABNORMAL HIGH (ref 70–99)
Glucose-Capillary: 183 mg/dL — ABNORMAL HIGH (ref 70–99)
Glucose-Capillary: 214 mg/dL — ABNORMAL HIGH (ref 70–99)
Glucose-Capillary: 221 mg/dL — ABNORMAL HIGH (ref 70–99)

## 2020-12-06 LAB — PHOSPHORUS: Phosphorus: 3.5 mg/dL (ref 2.5–4.6)

## 2020-12-06 MED ORDER — CHLORHEXIDINE GLUCONATE 0.12 % MT SOLN
OROMUCOSAL | Status: AC
  Filled 2020-12-06: qty 15

## 2020-12-06 MED ORDER — AMLODIPINE BESYLATE 10 MG PO TABS
10.0000 mg | ORAL_TABLET | Freq: Every day | ORAL | Status: DC
  Administered 2020-12-06 – 2020-12-23 (×18): 10 mg via ORAL
  Filled 2020-12-06 (×18): qty 1

## 2020-12-06 MED ORDER — METOPROLOL TARTRATE 25 MG PO TABS
25.0000 mg | ORAL_TABLET | Freq: Two times a day (BID) | ORAL | Status: DC
  Administered 2020-12-06 – 2020-12-18 (×24): 25 mg
  Filled 2020-12-06 (×25): qty 1

## 2020-12-06 MED ORDER — POTASSIUM CHLORIDE 20 MEQ PO PACK
40.0000 meq | PACK | Freq: Once | ORAL | Status: AC
  Administered 2020-12-06: 40 meq via ORAL
  Filled 2020-12-06: qty 2

## 2020-12-06 MED ORDER — GUAIFENESIN-DM 100-10 MG/5ML PO SYRP
10.0000 mL | ORAL_SOLUTION | ORAL | Status: DC | PRN
  Administered 2020-12-06 – 2020-12-21 (×5): 10 mL
  Filled 2020-12-06 (×5): qty 10

## 2020-12-06 MED ORDER — POTASSIUM CHLORIDE CRYS ER 20 MEQ PO TBCR: 40.0000 meq | EXTENDED_RELEASE_TABLET | Freq: Once | ORAL | Status: AC

## 2020-12-06 NOTE — Progress Notes (Signed)
   Shared Decision Making/Informed Consent  The risks [stroke (1 in 1000), death (1 in 1000), kidney failure [usually temporary] (1 in 500), bleeding (1 in 200), allergic reaction [possibly serious] (1 in 200)], benefits (diagnostic support and management of coronary artery disease) and alternatives of a cardiac catheterization were discussed in detail with Albert Garrison 's health care power of attorney /Brother Albert Garrison over the phone [phone]  and he is willing to proceed with above procedure for the patient. Phone conversation was witnessed by nursing staff Ms Bettina Gavia R. Fowler.   Patient is scheduled for cardiac cath on 12/08/20, NPO after midnight. Orders placed.

## 2020-12-06 NOTE — Plan of Care (Addendum)
Called patient's nurse this morning, who reports patient's son should be called for any consent, unclear who is patient's health care proxy at this time, advised RN to clarify with primary team.   Per palliative note, patient stated "his brother is his primary contact however, he knows he is hard to reach and if needed provides permission to speak with his son "if absolutely necessary". "  Called patient's brother, no answer; called patient's son, voice mail left for all back. Patient's HCP/family still has not consented at this time for upcoming cardiac cath on 12/08/20. Will continue try get get in contact with family.   Hospitalist MD reports patient's HOPA is son Albert Garrison. Called son again, no one answers the phone. Advised RN to page cardiology team back when son is available for [place] to get consent for cardiac cath on Tuesday.   RN and hospitalist clarified that patient's HOPE is brother Albert Garrison.

## 2020-12-06 NOTE — Progress Notes (Addendum)
Progress Note  Patient Name: Albert Garrison Date of Encounter: 12/06/2020  Stout Cardiologist: Carlyle Dolly, MD   Subjective   Sleeping comfortably . No CP, no SOB. NGT.   Inpatient Medications    Scheduled Meds:  bisacodyl  10 mg Rectal QHS   chlorhexidine gluconate (MEDLINE KIT)  15 mL Mouth Rinse BID   Chlorhexidine Gluconate Cloth  6 each Topical Daily   enoxaparin (LOVENOX) injection  40 mg Subcutaneous Q24H   feeding supplement (PROSource TF)  45 mL Per Tube BID   insulin aspart  0-20 Units Subcutaneous Q4H   mouth rinse  15 mL Mouth Rinse q12n4p   metoprolol tartrate  12.5 mg Per Tube BID   sodium chloride flush  3 mL Intravenous Q12H   sodium chloride flush  3 mL Intravenous Q12H   Continuous Infusions:  sodium chloride Stopped (12/03/20 0511)   sodium chloride Stopped (12/04/20 1802)   sodium chloride Stopped (11/30/20 0009)   feeding supplement (OSMOLITE 1.5 CAL) 60 mL/hr at 12/05/20 1758   PRN Meds: sodium chloride, acetaminophen **OR** acetaminophen, bisacodyl, guaiFENesin-dextromethorphan, LORazepam, menthol-cetylpyridinium, ondansetron **OR** ondansetron (ZOFRAN) IV, sodium chloride flush   Vital Signs    Vitals:   12/06/20 0300 12/06/20 0405 12/06/20 0411 12/06/20 0712  BP:  (!) 160/79  (!) 177/85  Pulse: 68 62  63  Resp: (!) $RemoveB'24 20  16  'vDShlsLw$ Temp:  97.9 F (36.6 C)  97.6 F (36.4 C)  TempSrc:  Oral  Oral  SpO2: 97% 95%  98%  Weight:   88.3 kg   Height:        Intake/Output Summary (Last 24 hours) at 12/06/2020 0952 Last data filed at 12/06/2020 0900 Gross per 24 hour  Intake 654 ml  Output 2889 ml  Net -2235 ml   Last 3 Weights 12/06/2020 12/05/2020 12/05/2020  Weight (lbs) 194 lb 10.7 oz 223 lb 5.2 oz 223 lb 12.3 oz  Weight (kg) 88.3 kg 101.3 kg 101.5 kg      Telemetry    Short intermittent bursts of PVCs, sometimes 3-5 in succession, unchanged from prior.  No atrial fibrillation, no pauses, no sustained ventricular tachycardia.-  Personally Reviewed  ECG    No new- Personally Reviewed  Physical Exam   GEN: Well nourished, well developed, in no acute distress HEENT: normal, NGT Neck: no JVD, carotid bruits, or masses Cardiac: RRR; no murmurs, rubs, or gallops,no edema  Respiratory:  clear to auscultation bilaterally, normal work of breathing GI: soft, nontender, nondistended, + BS MS: no deformity or atrophy Skin: warm and dry, no rash Neuro:  Alert and Oriented x 3, Strength and sensation are intact Psych: euthymic mood, full affect   Labs    High Sensitivity Troponin:   Recent Labs  Lab 11/29/20 2243 11/30/20 0224 11/30/20 0503 11/30/20 0730  TROPONINIHS 116* 263* 306* 285*      Chemistry Recent Labs  Lab 12/01/20 0540 12/02/20 0300 12/04/20 0259 12/04/20 1821 12/05/20 2057 12/06/20 0300  NA 133*   < > 137  --  136 138  K 3.4*   < > 3.3* 3.8 4.0 3.7  CL 105   < > 104  --  105 104  CO2 20*   < > 22  --  24 27  GLUCOSE 199*   < > 114*  --  234* 112*  BUN 23   < > 8  --  17 15  CREATININE 1.27*   < > 1.14  --  1.19 1.17  CALCIUM 7.3*   < > 7.8*  --  8.2* 8.2*  PROT 4.1*  --  4.9*  --   --  5.0*  ALBUMIN 1.9*  --  2.1*  --   --  2.1*  AST 21  --  32  --   --  33  ALT 17  --  28  --   --  31  ALKPHOS 33*  --  44  --   --  42  BILITOT 1.0  --  0.7  --   --  0.5  GFRNONAA 59*   < > >60  --  >60 >60  ANIONGAP 8   < > 11  --  7 7   < > = values in this interval not displayed.     Hematology Recent Labs  Lab 12/02/20 0300 12/03/20 0217 12/06/20 0300  WBC 11.1* 11.0* 10.9*  RBC 3.95* 4.12* 4.03*  HGB 11.9* 12.4* 12.0*  HCT 36.2* 37.1* 36.2*  MCV 91.6 90.0 89.8  MCH 30.1 30.1 29.8  MCHC 32.9 33.4 33.1  RDW 13.9 14.1 14.2  PLT 308 340 364    BNPNo results for input(s): BNP, PROBNP in the last 168 hours.   DDimer No results for input(s): DDIMER in the last 168 hours.   Radiology    MR BRAIN W WO CONTRAST  Result Date: 12/05/2020 CLINICAL DATA:  Anoxic brain damage.  Additional history provided: Status post cardiac arrest. ROSC after 5 minutes. EXAM: MRI HEAD WITHOUT AND WITH CONTRAST TECHNIQUE: Multiplanar, multiecho pulse sequences of the brain and surrounding structures were obtained without and with intravenous contrast. CONTRAST:  57mL GADAVIST GADOBUTROL 1 MMOL/ML IV SOLN COMPARISON:  Head CT 11/29/2020.  MRI brain and MRA head 08/23/2017. FINDINGS: Brain: Mild generalized cerebral and cerebellar atrophy. Redemonstrated chronic small-vessel infarct within the left corona radiata/basal ganglia with associated chronic hemosiderin deposition at this site. Redemonstrated chronic lacunar infarct within the right thalamus. There is no acute infarct. No evidence of an intracranial mass. No extra-axial fluid collection. No midline shift. No pathologic intracranial enhancement. 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 Flow voids otherwise preserved within the proximal large arterial vessels. Skull and upper cervical spine: No focal suspicious marrow lesion. Sinuses/Orbits: Visualized orbits show no acute finding. Near complete fluid opacification of the bilateral sphenoid sinuses. Fluid opacification of the posterior left ethmoid air cells. Small bilateral maxillary sinus mucous retention cysts. Other: Trace fluid within the bilateral mastoid air cells. IMPRESSION: No evidence of acute intracranial abnormality. Specifically, there is no evidence of acute anoxic brain injury at this time. Redemonstrated chronic small vessel infarct within the right corona radiata/basal ganglia with chronic hemosiderin deposition at this site. Redemonstrated chronic lacunar infarct within the right thalamus. 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 Paranasal sinus disease, as described. Bilateral mastoid effusions. Electronically Signed   By: Kellie Simmering D.O.   On: 12/05/2020 17:09   DG Abd Portable  1V  Result Date: 12/04/2020 CLINICAL DATA:  Feeding tube placement. EXAM: PORTABLE ABDOMEN - 1 VIEW COMPARISON:  December 02, 2020. FINDINGS: The bowel gas pattern is normal. Distal tip of feeding tube is seen in expected position of distal stomach. No radio-opaque calculi or other significant radiographic abnormality are seen. IMPRESSION: Distal tip of feeding tube seen in expected position of distal stomach. Electronically Signed   By: Marijo Conception M.D.   On: 12/04/2020 11:16  Cardiac Studies   Echo with EF 65 to 70%  Patient Profile     75 y.o. male with prior stroke 2019 nonsustained ventricular tachycardia diabetes hyponatremia hypertension chronic kidney disease stage II who was seen originally on 11/30/2020 for the evaluation of reported cardiac arrest  Assessment & Plan    Cardiac arrest nonsustained ventricular tachycardia, reported bradycardia unresponsiveness treated with atropine epinephrine compressions under a minute no shocks delivered - Plan is for cardiac catheterization on Tuesday.  He was placed on the cath board by Dr. Percival Spanish.  Dr. Percival Spanish does state that his brother will likely have to participate in consent.  Tuesday 9 AM Dr. Gwenlyn Found.  PVCs/NSVT -Short bursts of multifocal PVCs noted in succession sometimes up to 5 beats.  No sustained runs of VT. - IV amiodarone has been stopped.  He is being supplemented for potassium and magnesium.  Will increase low dose beta-blocker to $RemoveBeforeD'25mg'[id number]$  BID via NG tube.  Acute respiratory failure - Extubated August 30.  Chronic kidney disease stage III - Creatinine decreased.  Improved  Small bowel obstruction - Gut seems to be working better.  Hyponatremia - Admitted with nausea sodium of 118 confusion.  Anoxic encephalopathy - Confusion was noted post hypoperfusion.  Reviewed notes from nursing/APP.  He will need to have his power of attorney/brother/ son aid and consent for cardiac catheterization tentatively planned for  Tuesday.  They are working on this.  [name] his son [phone]. His son is amenable with proceeding with cath apparently but was here late last night and did not get to speak with cardiology.   John his brother is unreachable, lives in the [place].      For questions or updates, please contact [place] Please consult [email or web address] for contact info under        Signed, Candee Furbish, MD  12/06/2020, 9:52 AM

## 2020-12-06 NOTE — Progress Notes (Signed)
PROGRESS NOTE  Albert [place] ZLD:[id number] DOB: 1945/08/11 DOA: 11/27/2020 PCP: Albert Squibb, MD   LOS: 8 days   Brief Narrative / Interim history: 75 year old male with history of hypertension, hyperlipidemia, former smoker, DM2, history of lower GI bleed, chronic kidney disease stage II comes into the hospital with nausea, poor p.o. intake and decreased appetite.  He presented initially to [facility] and was admitted 8/26.  He was found to be hyponatremic with a sodium of 118, had acute kidney injury and leukocytosis.  He was confused on admission thought to be secondary to hyponatremia.  Abdominal x-ray on hospital day #2 showed small bowel obstruction, and general surgery was consulted.  CT scan of the abdomen and pelvis showed a transition point in the right mid abdomen.  Hospital course was complicated by increased ectopy on telemetry and eventually had cardiac arrest late PM 8/28 when he became unresponsive and had agonal respirations.  CODE BLUE was called and ROSC was achieved in approximately 5 minutes.  There was concern about new onset left bundle branch block/STEMI and he was started on heparin, amiodarone and cardiology was consulted.  He was transferred to [facility] events 8/26 Admit 8/26 N/V 8/27 KUB with concern for distal SBO. ECHO with LVEF 55-60%, no RWMA, grade I diastolic dysfunction 9/03 CT ABD with small bowel obstruction with likely transition point in the right mid abdomen.  CT Head negative.  8/28 cardiac arrest >>NSVT with somewhat more sustained runs, progressive IVCD by ECG, reportedly bradycardia and unresponsiveness.  He was treated with atropine and epinephrine, had compressions under a minute with ROSC, no shocks delivered.  He was intubated and did require initiation of pressors including Levophed and vasopressin, also started on amiodarone 8/29 Transfer to [facility] for surgical / cardiac ongoing evaluation 8/29 Echo LVEF 65-70%, otherwise  grossly normal.  8/30 extubated  9/1-TRH took over   Subjective / 24h Interval events: Mildly confused but appropriate for most part.  No chest pain, no abdominal pain, no nausea or vomiting.  RN reports that he had some nausea last night so tube feeds were on hold  Assessment & Plan: Principal Problem NSVT, bradycardic arrest, concern for LBBB/STEMI-cardiology consulted, following.  This may have been due to multiple electrolyte disturbances versus Precedex related -Cardiology following, recommending continuing amiodarone for now.  He was initially on heparin but now he is on Lovenox subcu for DVT prophylaxis -Cardiology plans to do left heart cath for ischemic evaluation however his mental status needs to be better.  I think the plans are for Tuesday but son Albert [place] needs to be able to be contacted via phone  Active Problems Small bowel obstruction-with concern for transition point on the CT scan of the abdomen and pelvis.  General surgery consulted and followed patient while hospitalized.  He eventually improved with conservative management but failed swallow eval due to altered mental status and now has a core track tube.  -Repeat abdominal x-ray today due to reported nausea/vomiting.  Last bowel movement last night.  Acute metabolic encephalopathy-he has been extubated for a few days and more more alert however persistently confused.  MRI of the brain 9/3 without evidence for anoxic brain injury -Closely monitor mental status, currently dependent on tube feeds  Acute kidney injury on CKD stage II-Baseline creatinine around 1.2, currently at baseline  Fever- no obvious source. ? Aspiration during arrest. -He is status post 5 days of Unasyn, now monitored off antibiotics   Symptomatic Hyponatremia  -  Hyponatremia resolved, sodium stable today within normal limits  Hypoxic respiratory failure in the setting of cardiac arrest-extubated 8/30.  Wean off oxygen as tolerated  DM II - Poorly  controlled, admit A1c 8.6.   -SSI   CBG (last 3)  Recent Labs    12/06/20 0408 12/06/20 0711 12/06/20 1121  GLUCAP 109* 125* 214*     HTN -continue metoprolol     Scheduled Meds:  amLODipine  10 mg Oral Daily   bisacodyl  10 mg Rectal QHS   chlorhexidine gluconate (MEDLINE KIT)  15 mL Mouth Rinse BID   Chlorhexidine Gluconate Cloth  6 each Topical Daily   enoxaparin (LOVENOX) injection  40 mg Subcutaneous Q24H   feeding supplement (PROSource TF)  45 mL Per Tube BID   insulin aspart  0-20 Units Subcutaneous Q4H   mouth rinse  15 mL Mouth Rinse q12n4p   metoprolol tartrate  25 mg Per Tube BID   sodium chloride flush  3 mL Intravenous Q12H   sodium chloride flush  3 mL Intravenous Q12H   Continuous Infusions:  sodium chloride Stopped (12/03/20 0511)   sodium chloride Stopped (12/04/20 1802)   sodium chloride Stopped (11/30/20 0009)   feeding supplement (OSMOLITE 1.5 CAL) 60 mL/hr at 12/05/20 1758   PRN Meds:.sodium chloride, acetaminophen **OR** acetaminophen, bisacodyl, guaiFENesin-dextromethorphan, LORazepam, menthol-cetylpyridinium, ondansetron **OR** ondansetron (ZOFRAN) IV, sodium chloride flush  Diet Orders (From admission, onward)     Start     Ordered   12/04/20 1902  DIET - DYS 1 Room service appropriate? Yes with Assist; Fluid consistency: Nectar Thick  Diet effective now       Question Answer Comment  Room service appropriate? Yes with Assist   Fluid consistency: Nectar Thick      12/04/20 1902            DVT prophylaxis: enoxaparin (LOVENOX) injection 40 mg Start: 12/03/20 1600 SCDs Start: 11/27/20 1854 Place TED hose Start: 11/27/20 1854     Code Status: Full Code  Family Communication: No family at bedside, discussed with son Albert G[place] over the phone last night  Status is: Inpatient  Remains inpatient appropriate because:Inpatient level of care appropriate due to severity of illness  Dispo: The patient is from: Home              Anticipated  d/c is to: SNF              Patient currently is not medically stable to d/c.   Difficult to place patient No  Level of care: Progressive  Consultants:  PCCM General surgery Cardiology   Procedures:  2D echo:   1. Left ventricular ejection fraction, by estimation, is 65 to 70%. The left ventricle has normal function. The left ventricle has no regional wall motion abnormalities. There is mild left ventricular hypertrophy. Left ventricular diastolic parameters are indeterminate.   2. Right ventricular systolic function is normal. The right ventricular size is normal. Tricuspid regurgitation signal is inadequate for assessing PA pressure.   3. The mitral valve is grossly normal.   4. Aortic valve regurgitation is mild.   5. Aortic dilatation noted. There is mild dilatation of the aortic root, measuring 42 mm.  Microbiology  Respiratory culture 8/30-normal respiratory flora, no staph or Pseudomonas  Antimicrobials: Unasyn    Objective: Vitals:   12/06/20 0405 12/06/20 0411 12/06/20 0712 12/06/20 1120  BP: (!) 160/79  (!) 177/85 (!) 150/87  Pulse: 62  63 61  Resp: 20  16 20  Temp: 97.9 F (36.6 C)  97.6 F (36.4 C) 97.6 F (36.4 C)  TempSrc: Oral  Oral Oral  SpO2: 95%  98% 97%  Weight:  88.3 kg    Height:        Intake/Output Summary (Last 24 hours) at 12/06/2020 1129 Last data filed at 12/06/2020 1007 Gross per 24 hour  Intake 654 ml  Output 3814 ml  Net -3160 ml    Filed Weights   12/05/20 0325 12/05/20 0440 12/06/20 0411  Weight: 101.5 kg 101.3 kg 88.3 kg    Examination:  Constitutional: NAD Eyes: No icterus ENMT: MMM Neck: normal, supple Respiratory: Clear bilaterally, no wheezing or crackles heard Cardiovascular: Regular rate and rhythm, no edema Abdomen: Soft, NT, ND, bowel sounds positive Musculoskeletal: no clubbing / cyanosis.  Skin: No rashes seen Neurologic: Alert to self, equal strength, no focal deficits  Data Reviewed: I have independently  reviewed following labs and imaging studies   CBC: Recent Labs  Lab 11/30/20 0225 12/01/20 0410 12/01/20 0521 12/02/20 0300 12/03/20 0217 12/06/20 0300  WBC 12.9* 10.8*  --  11.1* 11.0* 10.9*  HGB 13.6 11.9* 10.9* 11.9* 12.4* 12.0*  HCT 41.8 35.8* 32.0* 36.2* 37.1* 36.2*  MCV 93.7 91.1  --  91.6 90.0 89.8  PLT 377 306  --  308 340 967    Basic Metabolic Panel: Recent Labs  Lab 12/03/20 0217 12/03/20 2115 12/04/20 0259 12/04/20 1821 12/05/20 0431 12/05/20 1723 12/05/20 2057 12/06/20 0300  NA 139 138 137  --   --   --  136 138  K 3.5 4.3 3.3* 3.8  --   --  4.0 3.7  CL 107 106 104  --   --   --  105 104  CO2 20* 23 22  --   --   --  24 27  GLUCOSE 152* 142* 114*  --   --   --  234* 112*  BUN _0 --   --   --  17 15  CREATININE 1.24 1.12 1.14  --   --   --  1.19 1.17  CALCIUM 7.5* 8.0* 7.8*  --   --   --  8.2* 8.2*  MG 2.0  --  1.9 2.1 1.9 2.0  --  2.0  PHOS 1.5* 2.2* 2.5 2.1* 2.6 3.3  --  3.5    Liver Function Tests: Recent Labs  Lab 11/30/20 0224 12/01/20 0410 12/01/20 0540 12/04/20 0259 12/06/20 0300  AST 32  --  21 32 33  ALT 21  --  _1 ALKPHOS 44  --  33* 44 42  BILITOT 1.4*  --  1.0 0.7 0.5  PROT 5.6*  --  4.1* 4.9* 5.0*  ALBUMIN 2.7* 1.9* 1.9* 2.1* 2.1*    Coagulation Profile: No results for input(s): INR, PROTIME in the last 168 hours. HbA1C: No results for input(s): HGBA1C in the last 72 hours. CBG: Recent Labs  Lab 12/05/20 2014 12/06/20 0018 12/06/20 0408 12/06/20 0711 12/06/20 1121  GLUCAP 208* 221* 109* 125* 214*     Recent Results (from the past 240 hour(s))  Resp Panel by RT-PCR (Flu A&B, Covid) Nasopharyngeal Swab     Status: None   Collection Time: 11/27/20  9:16 AM   Specimen: Nasopharyngeal Swab; Nasopharyngeal(NP) swabs in vial transport medium  Result Value Ref Range Status   SARS Coronavirus 2 by RT PCR NEGATIVE NEGATIVE Final    Comment: (NOTE) SARS-CoV-2 target nucleic acids are NOT  DETECTED.  The  SARS-CoV-2 RNA is generally detectable in upper respiratory specimens during the acute phase of infection. The lowest concentration of SARS-CoV-2 viral copies this assay can detect is 138 copies/mL. A negative result does not preclude SARS-Cov-2 infection and should not be used as the sole basis for treatment or other patient management decisions. A negative result may occur with  improper specimen collection/handling, submission of specimen other than nasopharyngeal swab, presence of viral mutation(s) within the areas targeted by this assay, and inadequate number of viral copies(<138 copies/mL). A negative result must be combined with clinical observations, patient history, and epidemiological information. The expected result is Negative.  Fact Sheet for Patients:  EntrepreneurPulse.com.au  Fact Sheet for Healthcare Providers:  IncredibleEmployment.be  This test is no t yet approved or cleared by the [place] FDA and  has been authorized for detection and/or diagnosis of SARS-CoV-2 by FDA under an Emergency Use Authorization (EUA). This EUA will remain  in effect (meaning this test can be used) for the duration of the COVID-19 declaration under Section 564(b)(1) of the Act, 21 U.S.C.section 360bbb-3(b)(1), unless the authorization is terminated  or revoked sooner.       Influenza A by PCR NEGATIVE NEGATIVE Final   Influenza B by PCR NEGATIVE NEGATIVE Final    Comment: (NOTE) The Xpert Xpress SARS-CoV-2/FLU/RSV plus assay is intended as an aid in the diagnosis of influenza from Nasopharyngeal swab specimens and should not be used as a sole basis for treatment. Nasal washings and aspirates are unacceptable for Xpert Xpress SARS-CoV-2/FLU/RSV testing.  Fact Sheet for Patients: EntrepreneurPulse.com.au  Fact Sheet for Healthcare Providers: IncredibleEmployment.be  This test is not yet approved or  cleared by the [place] FDA and has been authorized for detection and/or diagnosis of SARS-CoV-2 by FDA under an Emergency Use Authorization (EUA). This EUA will remain in effect (meaning this test can be used) for the duration of the COVID-19 declaration under Section 564(b)(1) of the Act, 21 U.S.C. section 360bbb-3(b)(1), unless the authorization is terminated or revoked.  Performed at [facility], [address], [place], [place] [address]   MRSA Next Gen by PCR, Nasal     Status: None   Collection Time: 11/27/20 12:15 PM   Specimen: Nasal Mucosa; Nasal Swab  Result Value Ref Range Status   MRSA by PCR Next Gen NOT DETECTED NOT DETECTED Final    Comment: (NOTE) The GeneXpert MRSA Assay (FDA approved for NASAL specimens only), is one component of a comprehensive MRSA colonization surveillance program. It is not intended to diagnose MRSA infection nor to guide or monitor treatment for MRSA infections. Test performance is not FDA approved in patients less than 44 years old. Performed at [facility], [address] [place], [place] [address]   Culture, blood (Routine X 2) w Reflex to ID Panel     Status: None   Collection Time: 11/30/20 12:35 PM   Specimen: Right Antecubital; Blood  Result Value Ref Range Status   Specimen Description   Final    RIGHT ANTECUBITAL BOTTLES DRAWN AEROBIC AND ANAEROBIC   Special Requests Blood Culture adequate volume  Final   Culture   Final    NO GROWTH 5 DAYS Performed at [facility], [address], [place], [place] [address]    Report Status 12/05/2020 FINAL  Final  Culture, blood (Routine X 2) w Reflex to ID Panel     Status: None   Collection Time: 11/30/20 12:35 PM   Specimen: BLOOD LEFT HAND  Result Value Ref Range Status   Specimen Description   Final    BLOOD LEFT HAND BOTTLES DRAWN AEROBIC AND ANAEROBIC   Special Requests Blood Culture adequate volume  Final   Culture   Final    NO GROWTH 5 DAYS Performed at [facility], [address], [place], [place] [address]    Report Status 12/05/2020 FINAL  Final  Culture, Respiratory w Gram Stain     Status: None   Collection Time: 12/01/20  8:30 AM   Specimen: Tracheal Aspirate  Result Value Ref Range Status   Specimen Description TRACHEAL ASPIRATE  Final   Special Requests NONE  Final   Gram Stain   Final    ABUNDANT WBC PRESENT, PREDOMINANTLY PMN ABUNDANT GRAM NEGATIVE RODS FEW GRAM POSITIVE COCCI IN CHAINS    Culture   Final    RARE Normal respiratory flora-no Staph aureus or Pseudomonas seen Performed at [facility], [address] [place], [place] [address]    Report Status 12/03/2020 FINAL  Final      Radiology Studies: MR BRAIN W WO CONTRAST  Result Date: 12/05/2020 CLINICAL DATA:  Anoxic brain damage. Additional history provided: Status post cardiac arrest. ROSC after 5 minutes. EXAM: MRI HEAD WITHOUT AND WITH CONTRAST TECHNIQUE: Multiplanar, multiecho pulse sequences of the brain and surrounding structures were obtained without and with intravenous contrast. CONTRAST:  4m GADAVIST GADOBUTROL 1 MMOL/ML IV SOLN COMPARISON:  Head CT 11/29/2020.  MRI brain and MRA head 08/23/2017. FINDINGS: Brain: Mild generalized cerebral and cerebellar atrophy. Redemonstrated chronic small-vessel infarct within the left corona radiata/basal ganglia with associated chronic hemosiderin deposition at this site. Redemonstrated chronic lacunar infarct within the right thalamus. There is no acute infarct. No evidence of an intracranial mass. No extra-axial fluid collection. No midline shift. No pathologic intracranial enhancement. 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 Flow voids otherwise preserved within the proximal large arterial vessels. Skull and upper cervical spine: No focal suspicious marrow lesion. Sinuses/Orbits: Visualized orbits show no acute finding. Near complete fluid opacification of  the bilateral sphenoid sinuses. Fluid opacification of the posterior left ethmoid air cells. Small bilateral maxillary sinus mucous retention cysts. Other: Trace fluid within the bilateral mastoid air cells. IMPRESSION: No evidence of acute intracranial abnormality. Specifically, there is no evidence of acute anoxic brain injury at this time. Redemonstrated chronic small vessel infarct within the right corona radiata/basal ganglia with chronic hemosiderin deposition at this site. Redemonstrated chronic lacunar infarct within the right thalamus. 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 Paranasal sinus disease, as described. Bilateral mastoid effusions. Electronically Signed   By: KKellie SimmeringD.O.   On: 12/05/2020 17:09     CMarzetta Board MD, PhD [facility]  Between 7 am - 7 pm I am available, please contact me via Amion (for emergencies) or Securechat (non urgent messages)  Between 7 pm - 7 am I am not available, please contact night coverage MD/APP via Amion

## 2020-12-07 DIAGNOSIS — K56609 Unspecified intestinal obstruction, unspecified as to partial versus complete obstruction: Secondary | ICD-10-CM | POA: Diagnosis not present

## 2020-12-07 DIAGNOSIS — I469 Cardiac arrest, cause unspecified: Secondary | ICD-10-CM | POA: Diagnosis not present

## 2020-12-07 DIAGNOSIS — G9340 Encephalopathy, unspecified: Secondary | ICD-10-CM | POA: Diagnosis not present

## 2020-12-07 DIAGNOSIS — J9601 Acute respiratory failure with hypoxia: Secondary | ICD-10-CM | POA: Diagnosis not present

## 2020-12-07 LAB — COMPREHENSIVE METABOLIC PANEL
ALT: 35 U/L (ref 0–44)
AST: 34 U/L (ref 15–41)
Albumin: 2.4 g/dL — ABNORMAL LOW (ref 3.5–5.0)
Alkaline Phosphatase: 47 U/L (ref 38–126)
Anion gap: 7 (ref 5–15)
BUN: 14 mg/dL (ref 8–23)
CO2: 26 mmol/L (ref 22–32)
Calcium: 8.4 mg/dL — ABNORMAL LOW (ref 8.9–10.3)
Chloride: 100 mmol/L (ref 98–111)
Creatinine, Ser: 1.08 mg/dL (ref 0.61–1.24)
GFR, Estimated: >60 mL/min (ref >60)
Glucose, Bld: 208 mg/dL — ABNORMAL HIGH (ref 70–99)
Potassium: 4 mmol/L (ref 3.5–5.1)
Sodium: 133 mmol/L — ABNORMAL LOW (ref 135–145)
Total Bilirubin: 0.6 mg/dL (ref 0.3–1.2)
Total Protein: 5.8 g/dL — ABNORMAL LOW (ref 6.5–8.1)

## 2020-12-07 LAB — GLUCOSE, CAPILLARY
Glucose-Capillary: 170 mg/dL — ABNORMAL HIGH (ref 70–99)
Glucose-Capillary: 196 mg/dL — ABNORMAL HIGH (ref 70–99)
Glucose-Capillary: 182 mg/dL — ABNORMAL HIGH (ref 70–99)
Glucose-Capillary: 206 mg/dL — ABNORMAL HIGH (ref 70–99)
Glucose-Capillary: 198 mg/dL — ABNORMAL HIGH (ref 70–99)

## 2020-12-07 LAB — TRIGLYCERIDES: Triglycerides: 134 mg/dL (ref <150)

## 2020-12-07 LAB — MAGNESIUM: Magnesium: 2 mg/dL (ref 1.7–2.4)

## 2020-12-07 LAB — PHOSPHORUS: Phosphorus: 2.8 mg/dL (ref 2.5–4.6)

## 2020-12-07 MED ORDER — ASPIRIN 81 MG PO CHEW
81.0000 mg | CHEWABLE_TABLET | ORAL | Status: AC
  Administered 2020-12-08: 81 mg via ORAL
  Filled 2020-12-07: qty 1

## 2020-12-07 MED ORDER — SODIUM CHLORIDE 0.9% FLUSH: 3.0000 mL | INTRAVENOUS | Status: DC | PRN

## 2020-12-07 MED ORDER — SODIUM CHLORIDE 0.9 % WEIGHT BASED INFUSION: 3.0000 mL/kg/h | INTRAVENOUS | Status: DC

## 2020-12-07 MED ORDER — SODIUM CHLORIDE 0.9% FLUSH
3.0000 mL | Freq: Two times a day (BID) | INTRAVENOUS | Status: DC
  Administered 2020-12-07 – 2020-12-08 (×2): 3 mL via INTRAVENOUS

## 2020-12-07 MED ORDER — SODIUM CHLORIDE 0.9 % WEIGHT BASED INFUSION
1.0000 mL/kg/h | INTRAVENOUS | Status: DC
  Administered 2020-12-08: 1 mL/kg/h via INTRAVENOUS

## 2020-12-07 MED ORDER — SODIUM CHLORIDE 0.9 % IV SOLN: 250.0000 mL | INTRAVENOUS | Status: DC | PRN

## 2020-12-07 NOTE — Care Management Important Message (Signed)
Important Message  Patient Details  Name: Albert G[place] MRN: [id number] Date of Birth: February 12, 1946   Medicare Important Message Given:  Yes     [name] 12/07/2020, 8:51 AM

## 2020-12-07 NOTE — Progress Notes (Signed)
PROGRESS NOTE  Albert Garrison DXA:[id number] DOB: October 11, 1945 DOA: 11/27/2020 PCP: Celene Squibb, MD   LOS: 9 days   Brief Narrative / Interim history: 75 year old male with history of hypertension, hyperlipidemia, former smoker, DM2, history of lower GI bleed, chronic kidney disease stage II comes into the hospital with nausea, poor p.o. intake and decreased appetite.  He presented initially to [facility] and was admitted 8/26.  He was found to be hyponatremic with a sodium of 118, had acute kidney injury and leukocytosis.  He was confused on admission thought to be secondary to hyponatremia.  Abdominal x-ray on hospital day #2 showed small bowel obstruction, and general surgery was consulted.  CT scan of the abdomen and pelvis showed a transition point in the right mid abdomen.  Hospital course was complicated by increased ectopy on telemetry and eventually had cardiac arrest late PM 8/28 when he became unresponsive and had agonal respirations.  CODE BLUE was called and ROSC was achieved in approximately 5 minutes.  There was concern about new onset left bundle branch block/STEMI and he was started on heparin, amiodarone and cardiology was consulted.  He was transferred to [facility] events 8/26 Admit 8/26 N/V 8/27 KUB with concern for distal SBO. ECHO with LVEF 55-60%, no RWMA, grade I diastolic dysfunction 2/09 CT ABD with small bowel obstruction with likely transition point in the right mid abdomen.  CT Head negative.  8/28 cardiac arrest >>NSVT with somewhat more sustained runs, progressive IVCD by ECG, reportedly bradycardia and unresponsiveness.  He was treated with atropine and epinephrine, had compressions under a minute with ROSC, no shocks delivered.  He was intubated and did require initiation of pressors including Levophed and vasopressin, also started on amiodarone 8/29 Transfer to [facility] for surgical / cardiac ongoing evaluation 8/29 Echo LVEF 65-70%, otherwise  grossly normal.  8/30 extubated  9/1-TRH took over   Subjective / 24h Interval events: Mildly confused but appropriate for most part.  No chest pain, no abdominal pain, no nausea or vomiting.  RN reports that he had some nausea last night so tube feeds were on hold  Assessment & Plan: Principal Problem NSVT, bradycardic arrest, concern for LBBB/STEMI-shortly after admission patient developed nonsustained VT, left bundle branch block and eventually had cardiac arrest requiring resuscitation.  Cardiology consulted, he was initially placed on amiodarone and now eventually transition to metoprolol.  He was placed on heparin for few days and now this has been discontinued. -He will undergo a left heart catheterization on 9/6  Active Problems Small bowel obstruction-with concern for transition point on the CT scan of the abdomen and pelvis.  General surgery consulted and followed patient while hospitalized.  He eventually improved with conservative management but failed swallow eval due to altered mental status.  Now has a core track tube.   Dysphagia-continue to work with speech, core track tube for now  Acute metabolic encephalopathy-due to persistent confusion he underwent an MRI of the brain on 9/3 without evidence for anoxic brain injury.  Gradually improving  Acute kidney injury on CKD stage II-Baseline creatinine around 1.2, currently at baseline.  Fever-possibly due to aspiration during arrest, he is status post 5 days of Unasyn, now monitored off antibiotics   Symptomatic Hyponatremia -Hyponatremia resolved, sodium stable today within normal limits  Hypoxic respiratory failure in the setting of cardiac arrest-extubated 8/30.  Wean off oxygen as tolerated  DM II - Poorly controlled, admit A1c 8.6.   -SSI   CBG (  last 3)  Recent Labs    12/06/20 2353 12/07/20 0420 12/07/20 0746  GLUCAP 183* 170* 196*     HTN -continue metoprolol     Scheduled Meds:  amLODipine  10 mg Oral  Daily   bisacodyl  10 mg Rectal QHS   chlorhexidine gluconate (MEDLINE KIT)  15 mL Mouth Rinse BID   Chlorhexidine Gluconate Cloth  6 each Topical Daily   enoxaparin (LOVENOX) injection  40 mg Subcutaneous Q24H   feeding supplement (PROSource TF)  45 mL Per Tube BID   insulin aspart  0-20 Units Subcutaneous Q4H   mouth rinse  15 mL Mouth Rinse q12n4p   metoprolol tartrate  25 mg Per Tube BID   sodium chloride flush  3 mL Intravenous Q12H   sodium chloride flush  3 mL Intravenous Q12H   Continuous Infusions:  sodium chloride Stopped (12/03/20 0511)   sodium chloride Stopped (12/04/20 1802)   sodium chloride Stopped (11/30/20 0009)   feeding supplement (OSMOLITE 1.5 CAL) 40 mL/hr at 12/06/20 1729   PRN Meds:.sodium chloride, acetaminophen **OR** acetaminophen, bisacodyl, guaiFENesin-dextromethorphan, LORazepam, menthol-cetylpyridinium, ondansetron **OR** ondansetron (ZOFRAN) IV, sodium chloride flush  Diet Orders (From admission, onward)     Start     Ordered   12/04/20 1902  DIET - DYS 1 Room service appropriate? Yes with Assist; Fluid consistency: Nectar Thick  Diet effective now       Question Answer Comment  Room service appropriate? Yes with Assist   Fluid consistency: Nectar Thick      12/04/20 1902            DVT prophylaxis: enoxaparin (LOVENOX) injection 40 mg Start: 12/03/20 1600 SCDs Start: 11/27/20 1854 Place TED hose Start: 11/27/20 1854     Code Status: Full Code  Family Communication: Discussed with brother at bedside on 9/4  Status is: Inpatient  Remains inpatient appropriate because:Inpatient level of care appropriate due to severity of illness  Dispo: The patient is from: Home              Anticipated d/c is to: SNF              Patient currently is not medically stable to d/c.   Difficult to place patient No  Level of care: Progressive  Consultants:  PCCM General surgery Cardiology   Procedures:  2D echo:   1. Left ventricular ejection  fraction, by estimation, is 65 to 70%. The left ventricle has normal function. The left ventricle has no regional wall motion abnormalities. There is mild left ventricular hypertrophy. Left ventricular diastolic parameters are indeterminate.   2. Right ventricular systolic function is normal. The right ventricular size is normal. Tricuspid regurgitation signal is inadequate for assessing PA pressure.   3. The mitral valve is grossly normal.   4. Aortic valve regurgitation is mild.   5. Aortic dilatation noted. There is mild dilatation of the aortic root, measuring 42 mm.  Microbiology  Respiratory culture 8/30-normal respiratory flora, no staph or Pseudomonas  Antimicrobials: Unasyn    Objective: Vitals:   12/07/20 0811 12/07/20 0832 12/07/20 0926 12/07/20 1000  BP: 134/89 134/86 134/86   Pulse:  83 84   Resp: $Remo'18 20 20   'WqUDv$ Temp: 97.6 F (36.4 C) 97.7 F (36.5 C) 97.7 F (36.5 C)   TempSrc: Oral     SpO2: 94%   96%  Weight:      Height:        Intake/Output Summary (Last 24 hours) at 12/07/2020 1038  Last data filed at 12/07/2020 1001 Gross per 24 hour  Intake 1832.5 ml  Output 2650 ml  Net -817.5 ml    Filed Weights   12/05/20 0440 12/06/20 0411 12/07/20 0400  Weight: 101.3 kg 88.3 kg 85.8 kg    Examination:  Constitutional: No distress, sitting in the chair Eyes: Sclerae anicteric ENMT: Moist mucous membranes Neck: normal, supple Respiratory: Lungs are clear bilaterally, no wheezing or crackles heard Cardiovascular: Regular rate and rhythm, no edema Abdomen: Soft, nontender, nondistended, bowel sounds positive Musculoskeletal: no clubbing / cyanosis.  Skin: No rashes seen Neurologic: Equal strength, no focal deficits  Data Reviewed: I have independently reviewed following labs and imaging studies   CBC: Recent Labs  Lab 12/01/20 0410 12/01/20 0521 12/02/20 0300 12/03/20 0217 12/06/20 0300  WBC 10.8*  --  11.1* 11.0* 10.9*  HGB 11.9* 10.9* 11.9* 12.4* 12.0*   HCT 35.8* 32.0* 36.2* 37.1* 36.2*  MCV 91.1  --  91.6 90.0 89.8  PLT 306  --  308 340 311    Basic Metabolic Panel: Recent Labs  Lab 12/03/20 2115 12/04/20 0259 12/04/20 1821 12/05/20 0431 12/05/20 1723 12/05/20 2057 12/06/20 0300 12/07/20 0552  NA 138 137  --   --   --  136 138 133*  K 4.3 3.3* 3.8  --   --  4.0 3.7 4.0  CL 106 104  --   --   --  105 104 100  CO2 23 22  --   --   --  $R'24 27 26  'sY$ GLUCOSE 142* 114*  --   --   --  234* 112* 208*  BUN 9 8  --   --   --  $R'17 15 14  'uM$ CREATININE 1.12 1.14  --   --   --  1.19 1.17 1.08  CALCIUM 8.0* 7.8*  --   --   --  8.2* 8.2* 8.4*  MG  --  1.9 2.1 1.9 2.0  --  2.0 2.0  PHOS 2.2* 2.5 2.1* 2.6 3.3  --  3.5 2.8    Liver Function Tests: Recent Labs  Lab 12/01/20 0410 12/01/20 0540 12/04/20 0259 12/06/20 0300 12/07/20 0552  AST  --  21 32 33 34  ALT  --  $R'17 28 31 'ol$ 35  ALKPHOS  --  33* 44 42 47  BILITOT  --  1.0 0.7 0.5 0.6  PROT  --  4.1* 4.9* 5.0* 5.8*  ALBUMIN 1.9* 1.9* 2.1* 2.1* 2.4*    Coagulation Profile: No results for input(s): INR, PROTIME in the last 168 hours. HbA1C: No results for input(s): HGBA1C in the last 72 hours. CBG: Recent Labs  Lab 12/06/20 1605 12/06/20 2049 12/06/20 2353 12/07/20 0420 12/07/20 0746  GLUCAP 167* 179* 183* 170* 196*     Recent Results (from the past 240 hour(s))  MRSA Next Gen by PCR, Nasal     Status: None   Collection Time: 11/27/20 12:15 PM   Specimen: Nasal Mucosa; Nasal Swab  Result Value Ref Range Status   MRSA by PCR Next Gen NOT DETECTED NOT DETECTED Final    Comment: (NOTE) The GeneXpert MRSA Assay (FDA approved for NASAL specimens only), is one component of a comprehensive MRSA colonization surveillance program. It is not intended to diagnose MRSA infection nor to guide or monitor treatment for MRSA infections. Test performance is not FDA approved in patients less than 25 years old. Performed at [facility], [address] [place], [place] [address]  Culture, blood (Routine X 2) w Reflex to ID Panel     Status: None   Collection Time: 11/30/20 12:35 PM   Specimen: Right Antecubital; Blood  Result Value Ref Range Status   Specimen Description   Final    RIGHT ANTECUBITAL BOTTLES DRAWN AEROBIC AND ANAEROBIC   Special Requests Blood Culture adequate volume  Final   Culture   Final    NO GROWTH 5 DAYS Performed at [facility], [address], [place], [place] [address]    Report Status 12/05/2020 FINAL  Final  Culture, blood (Routine X 2) w Reflex to ID Panel     Status: None   Collection Time: 11/30/20 12:35 PM   Specimen: BLOOD LEFT HAND  Result Value Ref Range Status   Specimen Description   Final    BLOOD LEFT HAND BOTTLES DRAWN AEROBIC AND ANAEROBIC   Special Requests Blood Culture adequate volume  Final   Culture   Final    NO GROWTH 5 DAYS Performed at [facility], [address], [place], [place] [address]    Report Status 12/05/2020 FINAL  Final  Culture, Respiratory w Gram Stain     Status: None   Collection Time: 12/01/20  8:30 AM   Specimen: Tracheal Aspirate  Result Value Ref Range Status   Specimen Description TRACHEAL ASPIRATE  Final   Special Requests NONE  Final   Gram Stain   Final    ABUNDANT WBC PRESENT, PREDOMINANTLY PMN ABUNDANT GRAM NEGATIVE RODS FEW GRAM POSITIVE COCCI IN CHAINS    Culture   Final    RARE Normal respiratory flora-no Staph aureus or Pseudomonas seen Performed at [facility], [place] [address] [place], [place] [address]    Report Status 12/03/2020 FINAL  Final      Radiology Studies: DG Abd Portable 1V  Result Date: 12/06/2020 CLINICAL DATA:  Small bowel obstruction. EXAM: PORTABLE ABDOMEN - 1 VIEW COMPARISON:  Abdominal radiographs dated 12/04/2020. FINDINGS: An enteric tube terminates in the stomach. No definite small bowel dilatation is identified. Air-fluid levels and free intraperitoneal air cannot be excluded on the supine exam. Gas overlies the rectum. Degenerative  changes are seen in the lumbar spine. IMPRESSION: Nonobstructive bowel gas pattern. Electronically Signed   By: Zerita Boers M.D.   On: 12/06/2020 12:38     Marzetta Board, MD, PhD [facility]  Between 7 am - 7 pm I am available, please contact me via Amion (for emergencies) or Securechat (non urgent messages)  Between 7 pm - 7 am I am not available, please contact night coverage MD/APP via Amion

## 2020-12-07 NOTE — Progress Notes (Signed)
Inpatient Rehabilitation Admissions Coordinator   I spoke with pt's brother, [name] by phone. He states he has pt's health care POA and document was scanned in when he was on 3 M. I asked him to bring a copy in tomorrow when he plans to be here for his brother's cath. Patient lacks the caregiver supports to admit to CIR, therefore he will need SNF. He was previously at [facility] SNF before going to his senior apartment per brother. I have updated acute team an TOC. We will sign off at this time.  Danne Baxter, RN, MSN Rehab Admissions Coordinator [phone] 12/07/2020 1:11 PM

## 2020-12-07 NOTE — Progress Notes (Signed)
  Speech Language Pathology Treatment: Dysphagia  Patient Details Name: Albert Garrison MRN: [id number] DOB: [birth year] Today's Date: 12/07/2020 Time: [id number] SLP Time Calculation (min) (ACUTE ONLY): 13 min  Assessment / Plan / Recommendation Clinical Impression  Pt alert and consenting to consume POs this am for skilled dysphagia treatment. Thin liquids via cup sips were significant for immediate and delayed coughing in 2/3 trials. NTL via cup were without s/sx, however pt requires cueing for small sips. Puree and simulated dysphagia 2 solids consumed via teaspoon with multiple swallows noted via palpation, more so with dysphagia 2 solids. Limited trials administered this date due to ultimate pt refusal. Given consistent s/sx of aspiration with thin liquids across multiple treatment sessions with possible impact of reduced attention/mentation, recommend MBS to further assess swallow physiology. Radiology unable to accommodate today due to reduced staffing. Will continue efforts to schedule. Son and RN notified. Will f/u.   HPI HPI: 75 y.o. male presented to [facility] ED with diarrhea, decreased p.o. intake and nausea. Pt admitted with acute encephalopathy and symptomatic hyponatremia. Abdominal distension 8/27. CT abd/pelvis (8/28) (+) SBO and NGT placed. 8/28 pt went into cardiac arrest, concern for possible STEMI. Transferred to [facility] for further treatment. CT head (8/28) revealed no acute intracranial abnormality. Chest xray (8/30) noted "appearance of airspace disease in the right middle lobe. Small left pleural effusion". Hosptial admission complicated by continued impulsivity and agitation with pt pulling out multiple NGT's despite restraints.  ETT: 8/28-8/30. PMHx significant for CKD, BPH, DMII, Hx of CVA, HTN, and GERD. SLE (08/23/2017) revealed cognitive communication deficits post CVA.      SLP Plan  Continue with current plan of care;MBS       Recommendations  Diet recommendations: Dysphagia  1 (puree);Nectar-thick liquid Liquids provided via: Cup;Straw Medication Administration: Via alternative means Supervision: Full supervision/cueing for compensatory strategies Compensations: Minimize environmental distractions;Slow rate;Small sips/bites;Other (Comment) (individual sips/swallows) Postural Changes and/or Swallow Maneuvers: Seated upright 90 degrees                Oral Care Recommendations: Oral care BID;Staff/trained caregiver to provide oral care Follow up Recommendations: Other (comment) (TBD) SLP Visit Diagnosis: Dysphagia, unspecified (R13.10) Plan: Continue with current plan of care;MBS       [place], [place], [place] Office Number: [phone]  Albert Garrison 12/07/2020, 12:04 PM

## 2020-12-07 NOTE — Progress Notes (Signed)
Physical Therapy Treatment Patient Details Name: Albert Garrison MRN: [id number] DOB: [birth year] Today's Date: 12/07/2020    History of Present Illness 75 y.o. male presenting to [facility] ED 8/29 with diarrhea, decreased p.o. intake and nausea. Transferred to [facility]. Patient admitted with acute encephalopathy and symptomatic hyponatremia. Abdominal distension 8/27. CT abd (+) SBO and NGT placed. Hosptial admission complicated by continued impulsivity and agitation with patient pulling out multiple NGT's despite restraints. 8/28 wide QRS with frequent consecutive PVCs and LBBB. Bradycardic and hypotensive followed by loss of pulse. Code blue called. ROSC achieved after several mins of CPR. Troponin peaked at 309. Concern for possible STEMI with cardiac cath scheduled 12/08/20.  PRVC 8/28-8/30. Cardiology following. PMHx significant for CKD, BPH, DMII, Hx of CVA, HTN, and GERD.    PT Comments    Pt more lethargic today and had received Ativan.  Required increased assistance and chair follow for rest breaks.  Requiring multimodal cues to initiate and mod A for balance.  Continue plan of care.     Follow Up Recommendations  CIR;Supervision/Assistance - 24 hour     Equipment Recommendations  Other (comment) (TBA next venue)    Recommendations for Other Services Rehab consult     Precautions / Restrictions Precautions Precautions: Fall Restrictions Weight Bearing Restrictions: No    Mobility  Bed Mobility Overal bed mobility: Needs Assistance Bed Mobility: Supine to Sit;Sit to Supine     Supine to sit: Min assist;+2 for safety/equipment Sit to supine: Min assist;+2 for safety/equipment   General bed mobility comments: Cues for direction with min A to lift trunk and steady .  Min A for legs back to bed    Transfers Overall transfer level: Needs assistance Equipment used: Rolling walker (2 wheeled) Transfers: Sit to/from Stand Sit to Stand: Min assist         General transfer comment:  Cues for hand placement with min A to steady, cues for controlled descent with hands to lower; performed x 2  Ambulation/Gait Ambulation/Gait assistance: Mod assist;+2 safety/equipment Gait Distance (Feet): 100 Feet (100'x2) Assistive device: Rolling walker (2 wheeled) Gait Pattern/deviations: Step-to pattern;Trunk flexed;Drifts right/left Gait velocity: decreased   General Gait Details: Pt needing assist to initiate and maintain forward momentum. Tends to push RW forward and difficult to correct.  Tends to lean L requiring assist and cues.  Had chair follow.  Very unsteady today.   Stairs             Wheelchair Mobility    Modified Rankin (Stroke Patients Only)       Balance Overall balance assessment: Needs assistance Sitting-balance support: Bilateral upper extremity supported Sitting balance-Leahy Scale: Poor     Standing balance support: Bilateral upper extremity supported Standing balance-Leahy Scale: Poor Standing balance comment: Requiring RW with posterior lean initially then L with gait                            Cognition Arousal/Alertness: Lethargic Behavior During Therapy: Impulsive;Restless Overall Cognitive Status: Impaired/Different from baseline Area of Impairment: Orientation;Attention;Following commands;Safety/judgement;Awareness;Problem solving;Memory                 Orientation Level: Disoriented to;Place;Time;Situation Current Attention Level: Focused Memory: Decreased short-term memory;Decreased recall of precautions Following Commands: Follows one step commands inconsistently Safety/Judgement: Decreased awareness of deficits;Decreased awareness of safety Awareness: Intellectual Problem Solving: Slow processing;Decreased initiation;Difficulty sequencing;Requires verbal cues;Requires tactile cues General Comments: Pt received Ativan earlier and is lethargic at times.  Not able to carry on appropriate conversation.  Multiple  cues to not pull NG tube      Exercises      General Comments General comments (skin integrity, edema, etc.): VSS      Pertinent Vitals/Pain Pain Assessment: No/denies pain    Home Living                      Prior Function            PT Goals (current goals can now be found in the care plan section) Acute Rehab PT Goals Patient Stated Goal: To get better. PT Goal Formulation: With patient Time For Goal Achievement: 12/16/20 Potential to Achieve Goals: Good Progress towards PT goals: Progressing toward goals    Frequency    Min 3X/week      PT Plan Current plan remains appropriate    Co-evaluation PT/OT/SLP Co-Evaluation/Treatment: Yes Reason for Co-Treatment: For patient/therapist safety (pt received Ativan earlier) PT goals addressed during session: Mobility/safety with mobility OT goals addressed during session: ADL's and self-care      AM-PAC PT "6 Clicks" Mobility   Outcome Measure  Help needed turning from your back to your side while in a flat bed without using bedrails?: A Little Help needed moving from lying on your back to sitting on the side of a flat bed without using bedrails?: A Little Help needed moving to and from a bed to a chair (including a wheelchair)?: A Lot Help needed standing up from a chair using your arms (e.g., wheelchair or bedside chair)?: A Little Help needed to walk in hospital room?: A Lot Help needed climbing 3-5 steps with a railing? : A Lot 6 Click Score: 15    End of Session Equipment Utilized During Treatment: Gait belt Activity Tolerance: Patient tolerated treatment well Patient left: in bed;with call bell/phone within reach;with bed alarm set Nurse Communication: Mobility status (Redness on L forearm) PT Visit Diagnosis: Unsteadiness on feet (R26.81);Other abnormalities of gait and mobility (R26.89);Muscle weakness (generalized) (M62.81);Difficulty in walking, not elsewhere classified (R26.2)     Time:  [id number] PT Time Calculation (min) (ACUTE ONLY): 23 min  Charges:  $Gait Training: 8-22 mins                     Abran Richard, PT Acute Rehab Services Pager [phone] [facility] [place] 12/07/2020, 11:31 AM

## 2020-12-07 NOTE — Progress Notes (Signed)
Occupational Therapy Treatment Patient Details Name: Albert Garrison MRN: [id number] DOB: 26-Sep-1945 Today's Date: 12/07/2020    History of present illness 75 y.o. male presenting to [facility] ED 8/29 with diarrhea, decreased p.o. intake and nausea. Transferred to [facility] Patient admitted with acute encephalopathy and symptomatic hyponatremia. Abdominal distension 8/27. CT abd (+) SBO and NGT placed. Hosptial admission complicated by continued impulsivity and agitation with patient pulling out multiple NGT's despite restraints. 8/28 wide QRS with frequent consecutive PVCs and LBBB. Bradycardic and hypotensive followed by loss of pulse. Code blue called. ROSC achieved after several mins of CPR. Troponin peaked at 309. Concern for possible STEMI with cardiac cath scheduled 12/08/20.  PRVC 8/28-8/30. Cardiology following. PMHx significant for CKD, BPH, DMII, Hx of CVA, HTN, and GERD.   OT comments  Pt continues to demonstrate significant cognitive impairment. Requires multimodal cues to follow commands, attempting to remove cortrak, with nonsensical conversation. Pt perseverative when combing hair, moderate assist for UB dressing. Pt ambulated in hall with up to mod assist to steer walker. Updated d/c recommendation to SNF.  Follow Up Recommendations  SNF;Supervision/Assistance - 24 hour    Equipment Recommendations  Other (comment) (defer to next venue)    Recommendations for Other Services      Precautions / Restrictions Precautions Precautions: Fall       Mobility Bed Mobility Overal bed mobility: Needs Assistance Bed Mobility: Supine to Sit;Sit to Supine     Supine to sit: Min assist;+2 for safety/equipment Sit to supine: Min assist;+2 for safety/equipment   General bed mobility comments: Cues for direction with min A to lift trunk and steady .  Min A for legs back to bed    Transfers Overall transfer level: Needs assistance Equipment used: Rolling walker (2 wheeled) Transfers: Sit  to/from Stand Sit to Stand: Min assist         General transfer comment: multimodal cues for hand placement with min A to steady, cues for controlled descent with hands to lower; performed x 2    Balance Overall balance assessment: Needs assistance Sitting-balance support: Bilateral upper extremity supported Sitting balance-Leahy Scale: Poor   Postural control: Posterior lean Standing balance support: Bilateral upper extremity supported Standing balance-Leahy Scale: Poor Standing balance comment: Requiring RW with posterior lean initially then L with gait                           ADL either performed or assessed with clinical judgement   ADL Overall ADL's : Needs assistance/impaired     Grooming: Brushing hair;Sitting Grooming Details (indicate cue type and reason): perseverated on combing same spot on his head         Upper Body Dressing : Sitting;Moderate assistance Upper Body Dressing Details (indicate cue type and reason): front opening gown                 Functional mobility during ADLs: Moderate assistance;Rolling walker;+2 for safety/equipment       Vision       Perception     Praxis      Cognition Arousal/Alertness: Lethargic Behavior During Therapy: Impulsive;Restless Overall Cognitive Status: Impaired/Different from baseline Area of Impairment: Orientation;Attention;Following commands;Safety/judgement;Awareness;Problem solving;Memory                 Orientation Level: Disoriented to;Place;Time;Situation Current Attention Level: Focused Memory: Decreased short-term memory;Decreased recall of precautions Following Commands: Follows one step commands inconsistently Safety/Judgement: Decreased awareness of deficits;Decreased awareness of safety Awareness: Intellectual  Problem Solving: Slow processing;Decreased initiation;Difficulty sequencing;Requires verbal cues;Requires tactile cues General Comments: Pt received Ativan  earlier and is lethargic at times.  Not able to carry on appropriate conversation.  Multiple cues to not pull NG tube        Exercises     Shoulder Instructions       General Comments VSS    Pertinent Vitals/ Pain       Pain Assessment: Faces Faces Pain Scale: No hurt  Home Living                                          Prior Functioning/Environment              Frequency  Min 2X/week        Progress Toward Goals  OT Goals(current goals can now be found in the care plan section)  Progress towards OT goals: Progressing toward goals  Acute Rehab OT Goals Patient Stated Goal: To get better. OT Goal Formulation: With patient/family Time For Goal Achievement: 12/16/20 Potential to Achieve Goals: Good  Plan Discharge plan needs to be updated    Co-evaluation    PT/OT/SLP Co-Evaluation/Treatment: Yes Reason for Co-Treatment: For patient/therapist safety;Necessary to address cognition/behavior during functional activity PT goals addressed during session: Mobility/safety with mobility OT goals addressed during session: ADL's and self-care      AM-PAC OT "6 Clicks" Daily Activity     Outcome Measure   Help from another person eating meals?: Total Help from another person taking care of personal grooming?: A Little Help from another person toileting, which includes using toliet, bedpan, or urinal?: A Lot Help from another person bathing (including washing, rinsing, drying)?: A Lot Help from another person to put on and taking off regular upper body clothing?: A Little Help from another person to put on and taking off regular lower body clothing?: A Lot 6 Click Score: 13    End of Session Equipment Utilized During Treatment: Gait belt;Rolling walker  OT Visit Diagnosis: Unsteadiness on feet (R26.81);Muscle weakness (generalized) (M62.81);Other symptoms and signs involving cognitive function   Activity Tolerance Patient tolerated treatment  well;Patient limited by fatigue   Patient Left in bed;with call bell/phone within reach;with bed alarm set   Nurse Communication Mobility status (red area on L arm)        Time: 1043-1106 OT Time Calculation (min): 23 min  Charges: OT General Charges $OT Visit: 1 Visit OT Treatments $Self Care/Home Management : 8-22 mins  Nestor Lewandowsky, OTR/L Acute Rehabilitation Services Pager: [phone] Office: [phone]   Malka So 12/07/2020, 1:13 PM

## 2020-12-07 NOTE — Progress Notes (Signed)
Progress Note  Patient Name: Albert Garrison Date of Encounter: 12/07/2020  [facility] Cardiologist: Carlyle Dolly, MD   Subjective   Sitting in chair. No CP, no SOB. NGT.   Inpatient Medications    Scheduled Meds:  amLODipine  10 mg Oral Daily   bisacodyl  10 mg Rectal QHS   chlorhexidine gluconate (MEDLINE KIT)  15 mL Mouth Rinse BID   Chlorhexidine Gluconate Cloth  6 each Topical Daily   enoxaparin (LOVENOX) injection  40 mg Subcutaneous Q24H   feeding supplement (PROSource TF)  45 mL Per Tube BID   insulin aspart  0-20 Units Subcutaneous Q4H   mouth rinse  15 mL Mouth Rinse q12n4p   metoprolol tartrate  25 mg Per Tube BID   sodium chloride flush  3 mL Intravenous Q12H   sodium chloride flush  3 mL Intravenous Q12H   Continuous Infusions:  sodium chloride Stopped (12/03/20 0511)   sodium chloride Stopped (12/04/20 1802)   sodium chloride Stopped (11/30/20 0009)   feeding supplement (OSMOLITE 1.5 CAL) 40 mL/hr at 12/06/20 1729   PRN Meds: sodium chloride, acetaminophen **OR** acetaminophen, bisacodyl, guaiFENesin-dextromethorphan, LORazepam, menthol-cetylpyridinium, ondansetron **OR** ondansetron (ZOFRAN) IV, sodium chloride flush   Vital Signs    Vitals:   12/06/20 2100 12/07/20 0000 12/07/20 0400 12/07/20 0423  BP: 135/68 (!) 159/70 (!) 190/77 129/67  Pulse:  63 80 78  Resp:  (!) 22 20   Temp:  98.5 F (36.9 C) 98.2 F (36.8 C)   TempSrc:  Oral Oral   SpO2:  97% 97% 98%  Weight:   85.8 kg   Height:        Intake/Output Summary (Last 24 hours) at 12/07/2020 0747 Last data filed at 12/07/2020 0300 Gross per 24 hour  Intake 1712.5 ml  Output 3575 ml  Net -1862.5 ml    Last 3 Weights 12/07/2020 12/06/2020 12/05/2020  Weight (lbs) 189 lb 1.6 oz 194 lb 10.7 oz 223 lb 5.2 oz  Weight (kg) 85.775 kg 88.3 kg 101.3 kg      Telemetry    Short intermittent bursts of PVCs, sometimes 3-5 in succession, unchanged from prior.  No atrial fibrillation, no pauses, no  sustained ventricular tachycardia.- Personally Reviewed  ECG    No new- Personally Reviewed  Physical Exam   GEN: Well nourished, well developed, in no acute distress HEENT: normal, NGT Neck: no JVD, carotid bruits, or masses Cardiac: RRR; no murmurs, rubs, or gallops,no edema  Respiratory:  clear to auscultation bilaterally, normal work of breathing GI: soft, nontender, nondistended, + BS, NG tube in place MS: no deformity or atrophy Skin: warm and dry, no rash Neuro:  Alert and Oriented x 3, Strength and sensation are intact Psych: euthymic mood, full affect   Labs    High Sensitivity Troponin:   Recent Labs  Lab 11/29/20 2243 11/30/20 0224 11/30/20 0503 11/30/20 0730  TROPONINIHS 116* 263* 306* 285*       Chemistry Recent Labs  Lab 12/04/20 0259 12/04/20 1821 12/05/20 2057 12/06/20 0300 12/07/20 0552  NA 137  --  136 138 133*  K 3.3*   < > 4.0 3.7 4.0  CL 104  --  105 104 100  CO2 22  --  $R'24 27 26  'wo$ GLUCOSE 114*  --  234* 112* 208*  BUN 8  --  $R'17 15 14  'AZ$ CREATININE 1.14  --  1.19 1.17 1.08  CALCIUM 7.8*  --  8.2* 8.2* 8.4*  PROT 4.9*  --   --  5.0* 5.8*  ALBUMIN 2.1*  --   --  2.1* 2.4*  AST 32  --   --  33 34  ALT 28  --   --  31 35  ALKPHOS 44  --   --  42 47  BILITOT 0.7  --   --  0.5 0.6  GFRNONAA >60  --  >60 >60 >60  ANIONGAP 11  --  $R'7 7 7   'qG$ < > = values in this interval not displayed.      Hematology Recent Labs  Lab 12/02/20 0300 12/03/20 0217 12/06/20 0300  WBC 11.1* 11.0* 10.9*  RBC 3.95* 4.12* 4.03*  HGB 11.9* 12.4* 12.0*  HCT 36.2* 37.1* 36.2*  MCV 91.6 90.0 89.8  MCH 30.1 30.1 29.8  MCHC 32.9 33.4 33.1  RDW 13.9 14.1 14.2  PLT 308 340 364     BNPNo results for input(s): BNP, PROBNP in the last 168 hours.   DDimer No results for input(s): DDIMER in the last 168 hours.   Radiology    Albert BRAIN W WO CONTRAST  Result Date: 12/05/2020 CLINICAL DATA:  Anoxic brain damage. Additional history provided: Status post cardiac  arrest. ROSC after 5 minutes. EXAM: MRI HEAD WITHOUT AND WITH CONTRAST TECHNIQUE: Multiplanar, multiecho pulse sequences of the brain and surrounding structures were obtained without and with intravenous contrast. CONTRAST:  38mL GADAVIST GADOBUTROL 1 MMOL/ML IV SOLN COMPARISON:  Head CT 11/29/2020.  MRI brain and MRA head 08/23/2017. FINDINGS: Brain: Mild generalized cerebral and cerebellar atrophy. Redemonstrated chronic small-vessel infarct within the left corona radiata/basal ganglia with associated chronic hemosiderin deposition at this site. Redemonstrated chronic lacunar infarct within the right thalamus. There is no acute infarct. No evidence of an intracranial mass. No extra-axial fluid collection. No midline shift. No pathologic intracranial enhancement. 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 Flow voids otherwise preserved within the proximal large arterial vessels. Skull and upper cervical spine: No focal suspicious marrow lesion. Sinuses/Orbits: Visualized orbits show no acute finding. Near complete fluid opacification of the bilateral sphenoid sinuses. Fluid opacification of the posterior left ethmoid air cells. Small bilateral maxillary sinus mucous retention cysts. Other: Trace fluid within the bilateral mastoid air cells. IMPRESSION: No evidence of acute intracranial abnormality. Specifically, there is no evidence of acute anoxic brain injury at this time. Redemonstrated chronic small vessel infarct within the right corona radiata/basal ganglia with chronic hemosiderin deposition at this site. Redemonstrated chronic lacunar infarct within the right thalamus. 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 Paranasal sinus disease, as described. Bilateral mastoid effusions. Electronically Signed   By: Kellie Simmering D.O.   On: 12/05/2020 17:09   DG Abd Portable 1V  Result Date: 12/06/2020 CLINICAL DATA:  Small bowel  obstruction. EXAM: PORTABLE ABDOMEN - 1 VIEW COMPARISON:  Abdominal radiographs dated 12/04/2020. FINDINGS: An enteric tube terminates in the stomach. No definite small bowel dilatation is identified. Air-fluid levels and free intraperitoneal air cannot be excluded on the supine exam. Gas overlies the rectum. Degenerative changes are seen in the lumbar spine. IMPRESSION: Nonobstructive bowel gas pattern. Electronically Signed   By: Zerita Boers M.D.   On: 12/06/2020 12:38    Cardiac Studies   Echo with EF 65 to 70%  Patient Profile     75 y.o. male with prior stroke 2019 nonsustained ventricular tachycardia diabetes hyponatremia hypertension chronic kidney disease stage II who was seen originally on 11/30/2020 for the  evaluation of reported cardiac arrest  Assessment & Plan    Cardiac arrest nonsustained ventricular tachycardia, reported bradycardia unresponsiveness treated with atropine epinephrine compressions under a minute no shocks delivered - Plan is for cardiac catheterization tomorrow.  -cardiac catheterization were discussed in detail with Albert Garrison 's health care power of attorney Albert Garrison Albert Garrison over the phone [phone]  and he is willing to proceed with above procedure for the patient. Phone conversation was witnessed by nursing staff Ms Bettina Gavia R. Fowler.  - Echo is normal  PVCs/NSVT -Short bursts of multifocal PVCs noted in succession sometimes up to 5 beats.  No sustained runs of VT. - IV amiodarone has been stopped.  He is being supplemented for potassium and magnesium.  on low dose beta-blocker to $RemoveBeforeD'25mg'OuXSAEeopqXUjs$  BID via NG tube.  Acute respiratory failure - Extubated August 30.  Chronic kidney disease stage III - Creatinine decreased.  Improved  Small bowel obstruction - Gut seems to be working better.  Hyponatremia - Admitted with nausea sodium of 118 confusion.  Anoxic encephalopathy - Confusion was noted post hypoperfusion.   Albert Garrison [phone]. His  Garrison is amenable with proceeding with cath apparently but was here late last night and did not get to speak with cardiology.  Brother Albert Garrison - phone [phone]        For questions or updates, please contact [place] Please consult [email or web address] for contact info under        Signed, Lorane Cousar [place], MD  12/07/2020, 7:47 AM

## 2020-12-07 NOTE — Plan of Care (Signed)
  Problem: Health Behavior/Discharge Planning: Goal: Ability to manage health-related needs will improve Outcome: Progressing   Problem: Clinical Measurements: Goal: Ability to maintain clinical measurements within normal limits will improve Outcome: Progressing   Problem: Activity: Goal: Risk for activity intolerance will decrease Outcome: Progressing   

## 2020-12-07 NOTE — Progress Notes (Signed)
CN was called to pts room. Pt is attempting to remove foley and NG tube. Pt was able to tell nurse his name and asked to tell nurse where he was, he stated with " yes, in a box".   This nurse is currently sitting with pt until further MD orders.  Bedside nurse aware.

## 2020-12-08 ENCOUNTER — Inpatient Hospital Stay (HOSPITAL_COMMUNITY): Payer: Medicare Other

## 2020-12-08 ENCOUNTER — Encounter (HOSPITAL_COMMUNITY)
Admission: EM | Disposition: A | Discharge status: Skilled Nursing Facility | Payer: Self-pay | Source: Home / Self Care | Attending: Internal Medicine

## 2020-12-08 DIAGNOSIS — I469 Cardiac arrest, cause unspecified: Secondary | ICD-10-CM | POA: Diagnosis not present

## 2020-12-08 DIAGNOSIS — E871 Hypo-osmolality and hyponatremia: Secondary | ICD-10-CM | POA: Diagnosis not present

## 2020-12-08 DIAGNOSIS — G9340 Encephalopathy, unspecified: Secondary | ICD-10-CM | POA: Diagnosis not present

## 2020-12-08 DIAGNOSIS — K56609 Unspecified intestinal obstruction, unspecified as to partial versus complete obstruction: Secondary | ICD-10-CM | POA: Diagnosis not present

## 2020-12-08 DIAGNOSIS — J9601 Acute respiratory failure with hypoxia: Secondary | ICD-10-CM | POA: Diagnosis not present

## 2020-12-08 LAB — GLUCOSE, CAPILLARY
Glucose-Capillary: 104 mg/dL — ABNORMAL HIGH (ref 70–99)
Glucose-Capillary: 137 mg/dL — ABNORMAL HIGH (ref 70–99)
Glucose-Capillary: 153 mg/dL — ABNORMAL HIGH (ref 70–99)
Glucose-Capillary: 228 mg/dL — ABNORMAL HIGH (ref 70–99)
Glucose-Capillary: 234 mg/dL — ABNORMAL HIGH (ref 70–99)
Glucose-Capillary: 255 mg/dL — ABNORMAL HIGH (ref 70–99)
Glucose-Capillary: 292 mg/dL — ABNORMAL HIGH (ref 70–99)

## 2020-12-08 SURGERY — LEFT HEART CATH AND CORONARY ANGIOGRAPHY
Anesthesia: LOCAL

## 2020-12-08 NOTE — Progress Notes (Signed)
This nurse received a call from pt's son, Albert Garrison and daughter in law Penryn, inquiring about pt and his procedures. This nurse asked about a password and they explained they were point of contact. This nurse explained they were listed on the chart but were not POA, I would give them generalized information regarding pt's procedure. Albert Garrison became verbally hostile and states: he is the closet relative to the pt and Nisaiah Rybinski is not POA, as that paper work expired in 2016. This nurse explained I do not have any paperwork stating this and all I am able to do is go by what is in the chart. Albert Garrison remained hostile and began cursing at this nurse stating again, he was POA and pt's brother just wants his property and his car. This nurse explained this was not a problem we could handle on night shift but he could speak with CM and palliative on tomorrow to discuss POA. This made him more irate and he wanted to speak with CN. This nurse spoke with CN who then spoke with Albert Garrison and [place]. Albert Garrison remained verbally hostile and demanding information about his dad. CN explained HIPPA and he would need a password set up on the chart. He wanted to arrange over the phone and CN explained he would need to set it up in person. He states he is calling board of nursing and would like to speak with supervisor, CN went to transfer and he hung up the phone.

## 2020-12-08 NOTE — Progress Notes (Signed)
Patient's brother presented [place] paperwork. Copy was placed in paper chart and Epic.

## 2020-12-08 NOTE — Progress Notes (Signed)
Patient reporting shortness of breath. Patient's O2 sats 93-96% on room air. Notified MD

## 2020-12-08 NOTE — Progress Notes (Signed)
PROGRESS NOTE  Albert Garrison YIF:[id number] DOB: [birth year] DOA: 11/27/2020 PCP: Celene Squibb, MD   LOS: 10 days   Brief Narrative / Interim history: 75 year old male with history of hypertension, hyperlipidemia, former smoker, DM2, history of lower GI bleed, chronic kidney disease stage II comes into the hospital with nausea, poor p.o. intake and decreased appetite.  He presented initially to [facility] and was admitted 8/26.  He was found to be hyponatremic with a sodium of 118, had acute kidney injury and leukocytosis.  He was confused on admission thought to be secondary to hyponatremia.  Abdominal x-ray on hospital day #2 showed small bowel obstruction, and general surgery was consulted.  CT scan of the abdomen and pelvis showed a transition point in the right mid abdomen.  Hospital course was complicated by increased ectopy on telemetry and eventually had cardiac arrest late PM 8/28 when he became unresponsive and had agonal respirations.  CODE BLUE was called and ROSC was achieved in approximately 5 minutes.  There was concern about new onset left bundle branch block/STEMI and he was started on heparin, amiodarone and cardiology was consulted.  He was transferred to [facility] events 8/26 Admit 8/26 N/V 8/27 KUB with concern for distal SBO. ECHO with LVEF 55-60%, no RWMA, grade I diastolic dysfunction 8/67 CT ABD with small bowel obstruction with likely transition point in the right mid abdomen.  CT Head negative.  8/28 cardiac arrest >>NSVT with somewhat more sustained runs, progressive IVCD by ECG, reportedly bradycardia and unresponsiveness.  He was treated with atropine and epinephrine, had compressions under a minute with ROSC, no shocks delivered.  He was intubated and did require initiation of pressors including Levophed and vasopressin, also started on amiodarone 8/29 Transfer to [facility] for surgical / cardiac ongoing evaluation 8/29 Echo LVEF 65-70%, otherwise  grossly normal.  8/30 extubated  9/1-TRH took over   Subjective / 24h Interval events: Remains confused, but appropriate.  He denies any chest pain, denies any shortness of breath  Assessment & Plan: Principal Problem NSVT, bradycardic arrest, concern for LBBB/STEMI-shortly after admission patient developed nonsustained VT, left bundle branch block and eventually had cardiac arrest requiring resuscitation.  Cardiology consulted, he was initially placed on amiodarone and now eventually transition to metoprolol.  He was placed on heparin for few days and now this has been discontinued and is only on Lovenox for DVT prophylaxis -Plans are in place for cardiac cath today 5/6 however there is confusion about who should give consent/is the true POA, brother of the patient versus son of the patient.  Active Problems Small bowel obstruction-SBO was early in his hospital course, with concern for transition point on the CT scan of the abdomen and pelvis.  General surgery consulted and followed patient while hospitalized.  He eventually improved with conservative management but failed swallow eval due to altered mental status.  Now has a core track tube and SBO has resolved  Dysphagia-continue to work with speech, core track tube for now  Acute metabolic encephalopathy-due to persistent confusion he underwent an MRI of the brain on 9/3 without evidence for anoxic brain injury.  Gradually improving  Acute kidney injury on CKD stage II-Baseline creatinine around 1.2, currently at baseline.  Fever-possibly due to aspiration during arrest, he is status post 5 days of Unasyn, now monitored off antibiotics   Symptomatic Hyponatremia -Hyponatremia overall stable now, continue to monitor  Hypoxic respiratory failure in the setting of cardiac arrest-extubated 8/30.  Wean  off oxygen as tolerated  DM II - Poorly controlled, admit A1c 8.6.   -SSI   CBG (last 3)  Recent Labs    12/08/20 0019 12/08/20 0422  12/08/20 0742  GLUCAP 228* 255* 104*     HTN -continue metoprolol     Scheduled Meds:  amLODipine  10 mg Oral Daily   bisacodyl  10 mg Rectal QHS   chlorhexidine gluconate (MEDLINE KIT)  15 mL Mouth Rinse BID   Chlorhexidine Gluconate Cloth  6 each Topical Daily   enoxaparin (LOVENOX) injection  40 mg Subcutaneous Q24H   feeding supplement (PROSource TF)  45 mL Per Tube BID   insulin aspart  0-20 Units Subcutaneous Q4H   mouth rinse  15 mL Mouth Rinse q12n4p   metoprolol tartrate  25 mg Per Tube BID   sodium chloride flush  3 mL Intravenous Q12H   sodium chloride flush  3 mL Intravenous Q12H   sodium chloride flush  3 mL Intravenous Q12H   Continuous Infusions:  sodium chloride Stopped (12/03/20 0511)   sodium chloride Stopped (12/04/20 1802)   sodium chloride Stopped (11/30/20 0009)   sodium chloride     sodium chloride 1 mL/kg/hr (12/08/20 0441)   feeding supplement (OSMOLITE 1.5 CAL) Stopped (12/08/20 0000)   PRN Meds:.sodium chloride, sodium chloride, acetaminophen **OR** acetaminophen, bisacodyl, guaiFENesin-dextromethorphan, LORazepam, menthol-cetylpyridinium, ondansetron **OR** ondansetron (ZOFRAN) IV, sodium chloride flush, sodium chloride flush  Diet Orders (From admission, onward)     Start     Ordered   12/08/20 0001  Diet NPO time specified Except for: Sips with Meds  Diet effective midnight       Comments: NPO for solid foods after midnight, may have clear liquids until 5am, then NPO (this would be for inpatients and outpatients)  Question:  Except for  Answer:  Sips with Meds   12/07/20 2058            DVT prophylaxis: enoxaparin (LOVENOX) injection 40 mg Start: 12/03/20 1600 SCDs Start: 11/27/20 1854 Place TED hose Start: 11/27/20 1854     Code Status: Full Code  Family Communication: Discussed with brother at bedside on 9/4  Status is: Inpatient  Remains inpatient appropriate because:Inpatient level of care appropriate due to severity of  illness  Dispo: The patient is from: Home              Anticipated d/c is to: SNF              Patient currently is not medically stable to d/c.   Difficult to place patient No  Level of care: Progressive  Consultants:  PCCM General surgery Cardiology   Procedures:  2D echo:   1. Left ventricular ejection fraction, by estimation, is 65 to 70%. The left ventricle has normal function. The left ventricle has no regional wall motion abnormalities. There is mild left ventricular hypertrophy. Left ventricular diastolic parameters are indeterminate.   2. Right ventricular systolic function is normal. The right ventricular size is normal. Tricuspid regurgitation signal is inadequate for assessing PA pressure.   3. The mitral valve is grossly normal.   4. Aortic valve regurgitation is mild.   5. Aortic dilatation noted. There is mild dilatation of the aortic root, measuring 42 mm.  Microbiology  Respiratory culture 8/30-normal respiratory flora, no staph or Pseudomonas  Antimicrobials: Unasyn    Objective: Vitals:   12/07/20 2236 12/08/20 0405 12/08/20 0500 12/08/20 0803  BP: (!) 151/85 (!) 155/78  (!) 145/77  Pulse: 82 64  66  Resp:  20  20  Temp:    (!) 97.3 F (36.3 C)  TempSrc:    Oral  SpO2:  95%    Weight:   84.7 kg   Height:        Intake/Output Summary (Last 24 hours) at 12/08/2020 0856 Last data filed at 12/08/2020 0400 Gross per 24 hour  Intake 1292.33 ml  Output 2700 ml  Net -1407.67 ml    Filed Weights   12/06/20 0411 12/07/20 0400 12/08/20 0500  Weight: 88.3 kg 85.8 kg 84.7 kg    Examination:  Constitutional: No distress, in bed, alert Eyes: No scleral icterus ENMT: mmm Neck: normal, supple Respiratory: Lungs are clear bilaterally, no wheezing, no crackles, moves air well Cardiovascular: Regular rate and rhythm, no edema Abdomen: Soft, nontender, nondistended, positive bowel sounds Musculoskeletal: no clubbing / cyanosis.  Skin: No rashes  seen Neurologic: Nonfocal, equal strength  Data Reviewed: I have independently reviewed following labs and imaging studies   CBC: Recent Labs  Lab 12/02/20 0300 12/03/20 0217 12/06/20 0300  WBC 11.1* 11.0* 10.9*  HGB 11.9* 12.4* 12.0*  HCT 36.2* 37.1* 36.2*  MCV 91.6 90.0 89.8  PLT 308 340 696    Basic Metabolic Panel: Recent Labs  Lab 12/03/20 2115 12/04/20 0259 12/04/20 1821 12/05/20 0431 12/05/20 1723 12/05/20 2057 12/06/20 0300 12/07/20 0552  NA 138 137  --   --   --  136 138 133*  K 4.3 3.3* 3.8  --   --  4.0 3.7 4.0  CL 106 104  --   --   --  105 104 100  CO2 23 22  --   --   --  $R'24 27 26  'Ed$ GLUCOSE 142* 114*  --   --   --  234* 112* 208*  BUN 9 8  --   --   --  $R'17 15 14  'Du$ CREATININE 1.12 1.14  --   --   --  1.19 1.17 1.08  CALCIUM 8.0* 7.8*  --   --   --  8.2* 8.2* 8.4*  MG  --  1.9 2.1 1.9 2.0  --  2.0 2.0  PHOS 2.2* 2.5 2.1* 2.6 3.3  --  3.5 2.8    Liver Function Tests: Recent Labs  Lab 12/04/20 0259 12/06/20 0300 12/07/20 0552  AST 32 33 34  ALT 28 31 35  ALKPHOS 44 42 47  BILITOT 0.7 0.5 0.6  PROT 4.9* 5.0* 5.8*  ALBUMIN 2.1* 2.1* 2.4*    Coagulation Profile: No results for input(s): INR, PROTIME in the last 168 hours. HbA1C: No results for input(s): HGBA1C in the last 72 hours. CBG: Recent Labs  Lab 12/07/20 1600 12/07/20 2036 12/08/20 0019 12/08/20 0422 12/08/20 0742  GLUCAP 206* 198* 228* 255* 104*     Recent Results (from the past 240 hour(s))  Culture, blood (Routine X 2) w Reflex to ID Panel     Status: None   Collection Time: 11/30/20 12:35 PM   Specimen: Right Antecubital; Blood  Result Value Ref Range Status   Specimen Description   Final    RIGHT ANTECUBITAL BOTTLES DRAWN AEROBIC AND ANAEROBIC   Special Requests Blood Culture adequate volume  Final   Culture   Final    NO GROWTH 5 DAYS Performed at [facility], [address], [place], [place] [address]    Report Status 12/05/2020 FINAL  Final  Culture, blood  (Routine X 2) w Reflex to ID Panel  Status: None   Collection Time: 11/30/20 12:35 PM   Specimen: BLOOD LEFT HAND  Result Value Ref Range Status   Specimen Description   Final    BLOOD LEFT HAND BOTTLES DRAWN AEROBIC AND ANAEROBIC   Special Requests Blood Culture adequate volume  Final   Culture   Final    NO GROWTH 5 DAYS Performed at [facility], [address], [place], [place] [address]    Report Status 12/05/2020 FINAL  Final  Culture, Respiratory w Gram Stain     Status: None   Collection Time: 12/01/20  8:30 AM   Specimen: Tracheal Aspirate  Result Value Ref Range Status   Specimen Description TRACHEAL ASPIRATE  Final   Special Requests NONE  Final   Gram Stain   Final    ABUNDANT WBC PRESENT, PREDOMINANTLY PMN ABUNDANT GRAM NEGATIVE RODS FEW GRAM POSITIVE COCCI IN CHAINS    Culture   Final    RARE Normal respiratory flora-no Staph aureus or Pseudomonas seen Performed at [facility], [place] [address], [place], [place] [address]    Report Status 12/03/2020 FINAL  Final      Radiology Studies: No results found.   Marzetta Board, MD, PhD [facility]  Between 7 am - 7 pm I am available, please contact me via Amion (for emergencies) or Securechat (non urgent messages)  Between 7 pm - 7 am I am not available, please contact night coverage MD/APP via Amion

## 2020-12-08 NOTE — Progress Notes (Signed)
Daily Progress Note   Patient Name: Albert Garrison       Date: 12/08/2020 DOB: June 04, 1945  Age: 75 y.o. MRN#: [id number] Attending Physician: Caren Griffins, MD Primary Care Physician: Celene Squibb, MD Admit Date: 11/27/2020  Reason for Consultation/Follow-up: Establishing goals of care  Subjective: Chart Reviewed. Updates Received. Patient Assessed.   Patient remains confused. Awake and alert. Coretrak in place. No acute distress noted.   Met with patient's brother/HCPOA Harrison Mons) and his wife. Updates provided. We discussed at length patient's current illness and co-morbidities. Brother shares patient's son has been trying to gain HCPOA for financial gains. Patient and his son (as well as his 3 other children) did not have the closest or best relationship. This is similar to what patient expressed to me during the first days of his admission.   Brother has brought in [employer] paperwork. This has been reviewed in detail. There was a question of expiration, however advanced directives do not expire they can only become revoked by the patient or if a more recent updated copy has been completed. John verbalized understanding.   We discussed patient's condition, disease trajectory, and prognosis. Family is clear in agreement of delaying heart cath given patient's continued confusion and inability to follow commands.   I created space and opportunity to discuss patient's full code status with consideration of his current illness. Education provided on what code situation would look like in an emergent event. Brother verbalizes understanding and expresses patient would not want life-prolonging measures given his current quality of life. He request and confirms wishes for DNR/DNI. He shares patient has been independent, driving, and living along. He knows he would be ok with temporary rehab however, would not be for long-term.   We discussed patient's nutrition and current use of Coretrak. John  states he is in agreement with temporary feedings however, knows patient would not want any forms of long-term artificial feeding/PEG.   Family is clear in expressed goals to continue to treat the treatable allowing patient  every opportunity  to show some improvement. If patient was to further decline or show no significant improvement family would then consider comfort focused care. Family is hopeful for some improvement, rehab, with an eventual goal of returning home with support.    Length of Stay: 10 days  Vital Signs: BP (!) 141/74 (BP Location: Right Arm)   Pulse 60   Temp 97.7 F (36.5 C) (Oral)   Resp 18   Ht $R'5\' 11"'wD$  (1.803 m)   Wt 84.7 kg   SpO2 95%   BMI 26.04 kg/m  SpO2: SpO2: 95 % O2 Device: O2 Device: Room Air O2 Flow Rate: O2 Flow Rate (L/min): 2 L/min  Physical Exam: Awake, alert, confused, ill appearing Coretrak in place, 2L/[place] RRR Will not follow commands                Palliative Care Assessment & Plan   Code Status: DNR  Goals of Care/Recommendations: DNR/DNI-as requested and confirmed by brother [name] (POA) No artificial feedings/life-sustaining measures Brother, [name] is Economist. Documents on chart and also scanned into epic under ACP tab  Continue with current plan of care allowing patient every opportunity to improve. If no improvement or further decline family would then consider comfort focused care.  Outpatient palliative support at minimum.  PMT will continue to support and follow as needed. Goals clear and set. May not see daily. Please call with urgent needs.   Prognosis: Guarded-Poor  Discharge Planning: To Be Determined  Thank you for allowing the Palliative Medicine Team to assist in the care of this patient.  Time Total: 65 min.   Visit consisted of counseling and education dealing with the complex and emotionally intense issues of symptom management and palliative care in the setting of serious and potentially life-threatening  illness.Greater than 50%  of this time was spent counseling and coordinating care related to the above assessment and plan.  Alda Lea, AGPCNP-BC  Palliative Medicine Team [phone]

## 2020-12-08 NOTE — TOC Progression Note (Signed)
Transition of Care [facility]) - Progression Note    Patient Details  Name: JAMAHRI FRINGER MRN: [id number] Date of Birth: 1945/09/13  Transition of Care [facility]) CM/SW [place], [place] Phone Number: 12/08/2020, 3:37 PM  Clinical Narrative:     CSW is informed pt son has questions about POA. CSW called son and discussed that a new healthcare power of attorney is not able to be complete unless pt has capacity to make healthcare decisions and sign POA paper work. CSW explained he would clarify current POA as pt son states he was told that POA paper work that designated his brother as [name] was actually expired. He explains he was told this by RN when pt was in ICU.   CSW looked on pt's chart; HCPOA paper work designates pt's brother Zuri Merriman as [place]. There is no expiration date for paper work. There is an expiration date listed for the period that the notary's signature/stamp is valid. HCPOA paperwork was signed within the period that notary's signature was valid for. CSW explained this to pt son. He is very understanding and appreciates the clarity.     Expected Discharge Plan: [place] Barriers to Discharge: Continued Medical Work up  Expected Discharge Plan and Services Expected Discharge Plan: [place] arrangements for the past 2 months: [place]                                       Social Determinants of Health (SDOH) Interventions    Readmission Risk Interventions No flowsheet data found.

## 2020-12-08 NOTE — Plan of Care (Signed)
  Problem: Safety: Goal: Non-violent Restraint(s) Outcome: Progressing   
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
## 2020-12-08 NOTE — TOC Initial Note (Addendum)
Transition of Care [facility]) - Initial/Assessment Note    Patient Details  Name: Albert Garrison MRN: [id number] Date of Birth: 27-Nov-1945  Transition of Care [facility]) CM/SW Contact:    Bethann Berkshire, [place] Phone Number: 12/08/2020, 1:35 PM  Clinical Narrative:                  CSW called pt brother to discuss discharge plans. CSW explained recommendation for SNF. Brother is agreeable to SNF workup; he prefers [employer]. Before this admission pt was living in an independent living facility. He was driving and completing all ADLS by himself. Pt is from [place]; brother lives in [place]. Brother is only aware of pt having 1 [employer] covid vaccine. TOC will fax bed requests once pt is closer to dc and coretrak is removed.   Expected Discharge Plan: Skilled Nursing Facility Barriers to Discharge: Continued Medical Work up   Patient Goals and CMS Choice     Choice offered to / list presented to : Sibling  Expected Discharge Plan and Services Expected Discharge Plan: [place]       Living arrangements for the past 2 months: [place]                                      Prior Living Arrangements/Services Living arrangements for the past 2 months: [place] Lives with:: Self          Need for Family Participation in Patient Care: Yes (Comment) Care giver support system in place?: No (comment)      Activities of Daily Living Home Assistive Devices/Equipment: None ADL Screening (condition at time of admission) Patient's cognitive ability adequate to safely complete daily activities?: No Is the patient deaf or have difficulty hearing?: Yes Does the patient have difficulty seeing, even when wearing glasses/contacts?: No Does the patient have difficulty concentrating, remembering, or making decisions?: No Patient able to express need for assistance with ADLs?: Yes Does the patient have difficulty dressing or  bathing?: No Independently performs ADLs?: Yes (appropriate for developmental age) Does the patient have difficulty walking or climbing stairs?: No Weakness of Legs: None Weakness of Arms/Hands: None  Permission Sought/Granted                  Emotional Assessment       Orientation: : Oriented to Self, Oriented to Place Alcohol / Substance Use: Not Applicable Psych Involvement: No (comment)  Admission diagnosis:  Hyponatremia [E87.1] SBO (small bowel obstruction) ([place]) [K56.609] Patient Active Problem List   Diagnosis Date Noted   Acute hypoxemic respiratory failure ([place]) 11/30/2020   Shock circulatory ([place]) 11/30/2020   Cardiac arrest ([place]) 11/30/2020   Other specified cardiac arrhythmias--- sleep apnea- arrhythmias when he desaturates  11/30/2020   Endotracheally intubated 11/30/2020   SBO (small bowel obstruction) ([place]) 11/28/2020   Acute CVA (cerebrovascular accident) ([place]) 08/24/2017   Altered mental status 08/23/2017   Sleep apnea 08/23/2017   Elevated troponin 08/23/2017   Abnormal CT of the head 08/23/2017   Hypertension 08/23/2017   Type 2 diabetes mellitus ([place]) 08/23/2017   Syncope 08/23/2017   S/P carpal tunnel release right 07/13/17 07/27/2017   AKI (acute kidney injury) ([place]) 11/29/2015   Lower GI bleed 11/28/2015   Rectal bleed 11/28/2015   Rectal bleeding 11/28/2015   Benign prostatic hypertrophy with urinary retention 01/20/2015   Urinary retention 05/21/2014  ARF (acute renal failure) ([place]) 03/26/2014   IDDM (insulin dependent diabetes mellitus) ([place]) 03/26/2014   UTI (lower urinary tract infection) 03/24/2014   Hyponatremia 03/24/2014   Acute encephalopathy 03/24/2014   PCP:  Celene Squibb, MD Pharmacy:   [facility], [place] [address] Phone: [phone] Fax: [phone]     Social Determinants of Health (SDOH) Interventions    Readmission Risk Interventions No flowsheet  data found.

## 2020-12-08 NOTE — NC FL2 (Signed)
[place] LEVEL OF CARE SCREENING TOOL     IDENTIFICATION  Patient Name: Albert Garrison Birthdate: June 19, 1945 Sex: male Admission Date (Current Location): 11/27/2020  [facility] and [place] Number:      Facility and Address:  [facility], [place] [address] [place], [place] [address]      Provider Number: [id number]  Attending Physician Name and Address:  Caren Griffins, MD  Relative Name and Phone Number:  [place], [place] (Brother)   [phone] (Mobile)    Current Level of Care: Hospital Recommended Level of Care: [place] Prior Approval Number:    Date Approved/Denied:   PASRR Number: [id number]  Discharge Plan: SNF    Current Diagnoses: Patient Active Problem List   Diagnosis Date Noted   Acute hypoxemic respiratory failure ([place]) 11/30/2020   Shock circulatory ([place]) 11/30/2020   Cardiac arrest ([place]) 11/30/2020   Other specified cardiac arrhythmias--- sleep apnea- arrhythmias when he desaturates  11/30/2020   Endotracheally intubated 11/30/2020   SBO (small bowel obstruction) ([place]) 11/28/2020   Acute CVA (cerebrovascular accident) ([place]) 08/24/2017   Altered mental status 08/23/2017   Sleep apnea 08/23/2017   Elevated troponin 08/23/2017   Abnormal CT of the head 08/23/2017   Hypertension 08/23/2017   Type 2 diabetes mellitus ([place]) 08/23/2017   Syncope 08/23/2017   S/P carpal tunnel release right 07/13/17 07/27/2017   AKI (acute kidney injury) ([place]) 11/29/2015   Lower GI bleed 11/28/2015   Rectal bleed 11/28/2015   Rectal bleeding 11/28/2015   Benign prostatic hypertrophy with urinary retention 01/20/2015   Urinary retention 05/21/2014   ARF (acute renal failure) ([place]) 03/26/2014   IDDM (insulin dependent diabetes mellitus) ([place]) 03/26/2014   UTI (lower urinary tract infection) 03/24/2014   Hyponatremia 03/24/2014   Acute encephalopathy 03/24/2014    Orientation RESPIRATION BLADDER Height & Weight      Self, Place  Normal Incontinent, Indwelling catheter Weight: 186 lb 11.7 oz (84.7 kg) Height:  _0  (180.3 cm)  BEHAVIORAL SYMPTOMS/MOOD NEUROLOGICAL BOWEL NUTRITION STATUS      Continent Diet (see d/c summary)  AMBULATORY STATUS COMMUNICATION OF NEEDS Skin   Extensive Assist Verbally Normal                       Personal Care Assistance Level of Assistance  Bathing, Feeding, Dressing Bathing Assistance: Maximum assistance Feeding assistance: Limited assistance Dressing Assistance: Maximum assistance     Functional Limitations Info  Sight, Hearing, Speech Sight Info: Adequate Hearing Info: Impaired Speech Info: Adequate    SPECIAL CARE FACTORS FREQUENCY  PT (By licensed PT), OT (By licensed OT)     PT Frequency: 5x/week OT Frequency: 5x/week            Contractures Contractures Info: Not present    Additional Factors Info  Code Status, Allergies Code Status Info: DNR Allergies Info: no known allergies           Current Medications (12/08/2020):  This is the current hospital active medication list Current Facility-Administered Medications  Medication Dose Route Frequency Provider Last Rate Last Admin   0.9 %  sodium chloride infusion  250 mL Intravenous PRN Roxan Hockey, MD   Stopped at 12/04/20 1802   0.9 %  sodium chloride infusion  250 mL Intravenous Continuous Roxan Hockey, MD   Held at 11/30/20 0009   acetaminophen (TYLENOL) suppository 650 mg  650 mg Rectal Q4H PRN Frederik Pear, MD   650 mg at  12/03/20 0300   Or   acetaminophen (TYLENOL) tablet 650 mg  650 mg Per Tube Q4H PRN Frederik Pear, MD       amLODipine (NORVASC) tablet 10 mg  10 mg Oral Daily Caren Griffins, MD   10 mg at 12/08/20 0934   bisacodyl (DULCOLAX) suppository 10 mg  10 mg Rectal Daily PRN Roxan Hockey, MD   10 mg at 11/29/20 1705   bisacodyl (DULCOLAX) suppository 10 mg  10 mg Rectal QHS Emokpae, Courage, MD   10 mg at 12/07/20 2236   chlorhexidine gluconate  (MEDLINE KIT) (PERIDEX) 0.12 % solution 15 mL  15 mL Mouth Rinse BID Emokpae, Courage, MD   15 mL at 12/08/20 0934   Chlorhexidine Gluconate Cloth 2 % PADS 6 each  6 each Topical Daily Emokpae, Courage, MD   6 each at 12/08/20 0935   enoxaparin (LOVENOX) injection 40 mg  40 mg Subcutaneous Q24H Minus Breeding, MD   40 mg at 12/07/20 1504   feeding supplement (OSMOLITE 1.5 CAL) liquid 1,000 mL  1,000 mL Per Tube Continuous Caren Griffins, MD   Stopped at 12/08/20 0000   feeding supplement (PROSource TF) liquid 45 mL  45 mL Per Tube BID Caren Griffins, MD   45 mL at 12/07/20 2200   guaiFENesin-dextromethorphan (ROBITUSSIN DM) 100-10 MG/5ML syrup 10 mL  10 mL Per Tube Q4H PRN Caren Griffins, MD   10 mL at 12/07/20 1850   insulin aspart (novoLOG) injection 0-20 Units  0-20 Units Subcutaneous Q4H Henri Medal, RPH   4 Units at 12/08/20 1224   LORazepam (ATIVAN) injection 0.5 mg  0.5 mg Intravenous Q4H PRN Caren Griffins, MD   0.5 mg at 12/07/20 1032   MEDLINE mouth rinse  15 mL Mouth Rinse q12n4p Caren Griffins, MD   15 mL at 12/08/20 1225   menthol-cetylpyridinium (CEPACOL) lozenge 3 mg  1 lozenge Oral PRN Caren Griffins, MD       metoprolol tartrate (LOPRESSOR) tablet 25 mg  25 mg Per Tube BID Jerline Pain, MD   25 mg at 12/08/20 0934   ondansetron (ZOFRAN) tablet 4 mg  4 mg Per Tube Q6H PRN Henri Medal, RPH       Or   ondansetron [facility]) injection 4 mg  4 mg Intravenous Q6H PRN Henri Medal, RPH   4 mg at 12/05/20 0856   sodium chloride flush (NS) 0.9 % injection 3 mL  3 mL Intravenous Q12H Emokpae, Courage, MD   3 mL at 12/07/20 2200   sodium chloride flush (NS) 0.9 % injection 3 mL  3 mL Intravenous Q12H Emokpae, Courage, MD   3 mL at 12/07/20 2201   sodium chloride flush (NS) 0.9 % injection 3 mL  3 mL Intravenous PRN Roxan Hockey, MD   3 mL at 12/02/20 0312     Discharge Medications: Please see discharge summary for a list of discharge medications.  Relevant  Imaging Results:  Relevant Lab Results:   Additional Information SSN [id number] Pt has 1 johnson and [place] Selin Eisler, LCSW

## 2020-12-08 NOTE — Progress Notes (Signed)
Nutrition Follow-up  DOCUMENTATION CODES:   Not applicable  INTERVENTION:   -Continue Osmolite 1.5 @ 60 ml/hr via cortrak tube  45 ml Prosource TF BID  Tube feeding regimen provides 2240 kcal, 112 grams of protein, and 1097 ml of H2O.   NUTRITION DIAGNOSIS:   Inadequate oral intake related to inability to eat as evidenced by NPO status.  Ongoing  GOAL:   Patient will meet greater than or equal to 90% of their needs  Progressing  MONITOR:   PO intake, Diet advancement, Labs, Weight trends, TF tolerance, I & O's  REASON FOR ASSESSMENT:   Ventilator    ASSESSMENT:   75 year old male with PMH of HTN, T2DM, GERD, HOH, hyponatremia, CKD stage II, depression, CVA, OSA, GERD. Pt presented with SBO, acute encephalopathy. Pt required intubation after brief cardiac arrest and initiation of pressor support.  8/28 - intubated 8/30 - extubated 9/02 - diet advanced to clear liquids, Cortrak placed (tip gastric) 9/5- s/p BSE- advanced to dysphagia 1 diet with nectar thick liquids  Reviewed I/O's: -1.4 L x 24 hours and +1.3 L since admission  UOP: 2.7 L x 24 hours  Case discussed with nurse tech. TF and diet are currently held for planned heart cath today.   Spoke with pt at bedside, who was pleasant and in good spirits today. He reports he has not eaten anything today secondary to NPO status. Per pt, the only things he ate yesterday was "a tube of food that was not appetizing".   Discussed rationale for NPO status and plan to continue PO diet and cortrak tube. Recommend pt continue TF until he can demonstrate 75% of estimated oral intake via PO.   Labs reviewed: CBGS: 104-228 (inpatient orders for glycemic control are 0-20 units insulin aspart every 4 hours).    Diet Order:   Diet Order             DIET - DYS 1 Room service appropriate? Yes; Fluid consistency: Nectar Thick  Diet effective now                   EDUCATION NEEDS:   No education needs have been  identified at this time  Skin:  Skin Assessment: Reviewed RN Assessment  Last BM:  12/07/20  Height:   Ht Readings from Last 1 Encounters:  11/29/20 '5\' 11"'$  (1.803 m)    Weight:   Wt Readings from Last 1 Encounters:  12/08/20 84.7 kg   BMI:  Body mass index is 26.04 kg/m.  Estimated Nutritional Needs:   Kcal:  2100-2300  Protein:  110-130 grams  Fluid:  >/= 2.0 L since admit    Loistine Chance, RD, LDN, [place] Registered Dietitian II Certified Diabetes Care and Education Specialist Please refer to [facility] for RD and/or RD on-call/weekend/after hours pager

## 2020-12-09 ENCOUNTER — Inpatient Hospital Stay (HOSPITAL_COMMUNITY): Payer: Medicare Other

## 2020-12-09 DIAGNOSIS — I469 Cardiac arrest, cause unspecified: Secondary | ICD-10-CM | POA: Diagnosis not present

## 2020-12-09 DIAGNOSIS — K56609 Unspecified intestinal obstruction, unspecified as to partial versus complete obstruction: Secondary | ICD-10-CM | POA: Diagnosis not present

## 2020-12-09 LAB — CBC
HCT: 38.1 % — ABNORMAL LOW (ref 39.0–52.0)
Hemoglobin: 12.7 g/dL — ABNORMAL LOW (ref 13.0–17.0)
MCH: 30 pg (ref 26.0–34.0)
MCHC: 33.3 g/dL (ref 30.0–36.0)
MCV: 89.9 fL (ref 80.0–100.0)
Platelets: 500 10*3/uL — ABNORMAL HIGH (ref 150–400)
RBC: 4.24 MIL/uL (ref 4.22–5.81)
RDW: 13.5 % (ref 11.5–15.5)
WBC: 14.1 10*3/uL — ABNORMAL HIGH (ref 4.0–10.5)
nRBC: 0 % (ref 0.0–0.2)

## 2020-12-09 LAB — BASIC METABOLIC PANEL
Anion gap: 7 (ref 5–15)
BUN: 19 mg/dL (ref 8–23)
CO2: 24 mmol/L (ref 22–32)
Calcium: 8.3 mg/dL — ABNORMAL LOW (ref 8.9–10.3)
Chloride: 100 mmol/L (ref 98–111)
Creatinine, Ser: 1.18 mg/dL (ref 0.61–1.24)
GFR, Estimated: >60 mL/min (ref >60)
Glucose, Bld: 262 mg/dL — ABNORMAL HIGH (ref 70–99)
Potassium: 4.3 mmol/L (ref 3.5–5.1)
Sodium: 131 mmol/L — ABNORMAL LOW (ref 135–145)

## 2020-12-09 LAB — GLUCOSE, CAPILLARY
Glucose-Capillary: 204 mg/dL — ABNORMAL HIGH (ref 70–99)
Glucose-Capillary: 198 mg/dL — ABNORMAL HIGH (ref 70–99)
Glucose-Capillary: 280 mg/dL — ABNORMAL HIGH (ref 70–99)
Glucose-Capillary: 162 mg/dL — ABNORMAL HIGH (ref 70–99)
Glucose-Capillary: 280 mg/dL — ABNORMAL HIGH (ref 70–99)

## 2020-12-09 MED ORDER — INSULIN ASPART 100 UNIT/ML IJ SOLN
3.0000 [IU] | INTRAMUSCULAR | Status: DC
  Administered 2020-12-09 – 2020-12-13 (×21): 3 [IU] via SUBCUTANEOUS

## 2020-12-09 MED ORDER — INSULIN ASPART 100 UNIT/ML IJ SOLN
0.0000 [IU] | INTRAMUSCULAR | Status: DC
  Administered 2020-12-09: 8 [IU] via SUBCUTANEOUS
  Administered 2020-12-09: 2 [IU] via SUBCUTANEOUS
  Administered 2020-12-10 (×2): 3 [IU] via SUBCUTANEOUS
  Administered 2020-12-10 – 2020-12-11 (×5): 5 [IU] via SUBCUTANEOUS
  Administered 2020-12-11: 8 [IU] via SUBCUTANEOUS
  Administered 2020-12-11 (×2): 3 [IU] via SUBCUTANEOUS
  Administered 2020-12-11: 11 [IU] via SUBCUTANEOUS
  Administered 2020-12-12: 3 [IU] via SUBCUTANEOUS
  Administered 2020-12-12 (×3): 5 [IU] via SUBCUTANEOUS
  Administered 2020-12-12 (×2): 3 [IU] via SUBCUTANEOUS
  Administered 2020-12-13: 11 [IU] via SUBCUTANEOUS
  Administered 2020-12-13 (×2): 5 [IU] via SUBCUTANEOUS
  Administered 2020-12-13: 3 [IU] via SUBCUTANEOUS
  Administered 2020-12-13: 8 [IU] via SUBCUTANEOUS
  Administered 2020-12-14: 5 [IU] via SUBCUTANEOUS
  Administered 2020-12-14: 3 [IU] via SUBCUTANEOUS
  Administered 2020-12-14 (×2): 5 [IU] via SUBCUTANEOUS
  Administered 2020-12-14: 3 [IU] via SUBCUTANEOUS
  Administered 2020-12-14: 5 [IU] via SUBCUTANEOUS
  Administered 2020-12-15: 2 [IU] via SUBCUTANEOUS
  Administered 2020-12-15: 5 [IU] via SUBCUTANEOUS
  Administered 2020-12-15 (×3): 3 [IU] via SUBCUTANEOUS
  Administered 2020-12-15: 11 [IU] via SUBCUTANEOUS
  Administered 2020-12-16: 3 [IU] via SUBCUTANEOUS
  Administered 2020-12-16 (×2): 8 [IU] via SUBCUTANEOUS
  Administered 2020-12-16 (×2): 3 [IU] via SUBCUTANEOUS
  Administered 2020-12-16: 2 [IU] via SUBCUTANEOUS
  Administered 2020-12-17: 8 [IU] via SUBCUTANEOUS
  Administered 2020-12-17: 3 [IU] via SUBCUTANEOUS
  Administered 2020-12-17: 2 [IU] via SUBCUTANEOUS
  Administered 2020-12-17: 5 [IU] via SUBCUTANEOUS

## 2020-12-09 NOTE — Progress Notes (Signed)
Blood found in foley. Flushed the cathter and bag foley bag replaced. Attending made aware of finding. Will continue to monitor.

## 2020-12-09 NOTE — Consult Note (Signed)
   [facility] Inpatient Consult   12/09/2020  DEMAJ BATTERSBY 03/15/1946 [id number]  [place] [facility] [AC[facility]] Patient: [employer]  Primary Care Provider:  Celene Squibb, MD   Patient screened for length of stay hospitalization.  Review of patient's medical record reveals patient is being followed by the palliative care and inpatient [facility] team. Currently, transition of care is planned for a skilled nursing facility noted.  Plan:  Continue to follow progress and disposition to assess for post hospital care management needs.    For questions contact:   Natividad Brood, RN BSN [facility] Liaison  [phone] business mobile phone Toll free office [phone]  Fax number: [phone] [email or web address]

## 2020-12-09 NOTE — Progress Notes (Signed)
Modified Barium Swallow Progress Note  Patient Details  Name: Albert Garrison MRN: [id number] Date of Birth: [birth year]  Today's Date: 12/09/2020  Modified Barium Swallow completed.  Full report located under Chart Review in the Imaging Section.  Brief recommendations include the following:  Clinical Impression Pt presents with a moderate-severe oral and pharyngeal dysphagia, characterized as follows: Orally, pt demonstrates poor bolus formation and control, with premature spillage over the tongue base across consistencies. Pt was noted to piecemeal several boluses, swallowing small amounts of the bolus at one time. Oral residue was also noted, which then spilled posteriorly into the vallecular sinus.   Pharyngeal swallow is characterized by delayed swallow reflex, with trigger occurring at the level of the pyriform sinuses on nectar and honey thick liquids (cup sip), and at the vallecular sinus on honey (tsp) and puree. Epiglottic inversion was reduced, in large part by the presence of the Cortrak feeding tube. This resulted in vallecular residue across consistencies. This increases aspiration risk, as residue thins with saliva, or is added to by subsequent boluses and can potentially spill over the vallecular sinus and into the open airway.   SILENT ASPIRATION was seen on nectar thick liquids via teaspoon and cup sip. Flash penetration was noted during the swallow of honey thick liquids and puree. Pt was noted to be impulsive with cup sips, and has difficulty following commands due to confusion and hearing loss. Pt is at significantly high risk of aspiration of any consistency given. Puree and honey thick liquids may be presented in small bites/sips with 1:1 close supervision. Meds should be crushed in puree or given via Cortrak.   Swallow Evaluation Recommendations  SLP Diet Recommendations: Dysphagia 1 (Puree) solids;Honey thick liquids with FULL SUPERVISION   Liquid Administration via: Cup;No  straw;Spoon   Medication Administration: Crushed with puree   Supervision: Full assist for feeding;Staff to assist with self feeding;Full supervision/cueing for compensatory strategies   Compensations: Minimize environmental distractions;Slow rate;Small sips/bites   Postural Changes: Seated upright at 90 degrees;Remain semi-upright after after feeds/meals (Comment)   Oral Care Recommendations: Oral care QID   Other Recommendations: Order thickener from [place]. [name], [facility], [place] Speech Language Pathologist Office: [phone]  Shonna Chock 12/09/2020,2:05 PM

## 2020-12-09 NOTE — Progress Notes (Signed)
Physical Therapy Treatment Patient Details Name: Albert Garrison MRN: [id number] DOB: 1945-08-18 Today's Date: 12/09/2020    History of Present Illness 75 y.o. male presenting to [facility] ED 8/29 with diarrhea, decreased p.o. intake and nausea. Transferred to [facility]. Patient admitted with acute encephalopathy and symptomatic hyponatremia. Abdominal distension 8/27. CT abd (+) SBO and NGT placed. Hosptial admission complicated by continued impulsivity and agitation with patient pulling out multiple NGT's despite restraints. 8/28 wide QRS with frequent consecutive PVCs and LBBB. Bradycardic and hypotensive followed by loss of pulse. Code blue called. ROSC achieved after several mins of CPR. Troponin peaked at 309. Concern for possible STEMI with cardiac cath scheduled 12/08/20.  PRVC 8/28-8/30. Cardiology following. PMHx significant for CKD, BPH, DMII, Hx of CVA, HTN, and GERD.    PT Comments    Pt laying in bed naked on entry, agreeable to putting on gown and getting up to walk in room. Pt very disoriented to time, place and situation, able to follow single step commands, has difficulty sequencing and navigating in room. Easily distractible. Despite cognitive deficits pt mobility is improving. Pt is currently min guard for bed mobility and min A for transfers and ambulation. RN requested pt returned to bed at end of session. D/c plans remain appropriate. PT will continue to follow acutely.       Follow Up Recommendations  CIR;Supervision/Assistance - 24 hour     Equipment Recommendations  Rolling walker with 5" wheels       Precautions / Restrictions Precautions Precautions: Fall Restrictions Weight Bearing Restrictions: No    Mobility  Bed Mobility Overal bed mobility: Needs Assistance Bed Mobility: Supine to Sit;Sit to Supine     Supine to sit: Min guard;HOB elevated Sit to supine: Min guard   General bed mobility comments: min guard for safety, vc for sequencing    Transfers Overall  transfer level: Needs assistance Equipment used: Rolling walker (2 wheeled) Transfers: Sit to/from Stand Sit to Stand: Min assist         General transfer comment: contact guard assist for safety. good power up, increased effort for steadying  Ambulation/Gait Ambulation/Gait assistance: Min assist Gait Distance (Feet): 20 Feet (+40) Assistive device: Rolling walker (2 wheeled) Gait Pattern/deviations: Step-through pattern;Decreased step length - right;Decreased step length - left;Shuffle Gait velocity: decreased Gait velocity interpretation: <1.8 ft/sec, indicate of risk for recurrent falls General Gait Details: contact guard assist for safety, maximal multimodal cuing for navigation around obstacles with RW       Balance Overall balance assessment: Needs assistance Sitting-balance support: Bilateral upper extremity supported Sitting balance-Leahy Scale: Poor     Standing balance support: Bilateral upper extremity supported Standing balance-Leahy Scale: Poor                              Cognition Arousal/Alertness: Awake/alert Behavior During Therapy: Impulsive;Restless Overall Cognitive Status: Impaired/Different from baseline Area of Impairment: Orientation;Attention;Following commands;Safety/judgement;Awareness;Problem solving;Memory                 Orientation Level: Disoriented to;Place;Time;Situation Current Attention Level: Focused Memory: Decreased short-term memory;Decreased recall of precautions Following Commands: Follows one step commands inconsistently Safety/Judgement: Decreased awareness of deficits;Decreased awareness of safety Awareness: Intellectual Problem Solving: Slow processing;Decreased initiation;Difficulty sequencing;Requires verbal cues;Requires tactile cues General Comments: Pt very confused but awake and alert and able to follow commands for bed mobility and ambulation.         General Comments General comments (skin  integrity,  edema, etc.): VSS      Pertinent Vitals/Pain Pain Assessment: No/denies pain Faces Pain Scale: No hurt Pain Location: 'all over" Pain Descriptors / Indicators: Aching;Discomfort Pain Intervention(s): Limited activity within patient's tolerance;Monitored during session     PT Goals (current goals can now be found in the care plan section) Acute Rehab PT Goals Patient Stated Goal: To get better. PT Goal Formulation: With patient Time For Goal Achievement: 12/16/20 Potential to Achieve Goals: Good Progress towards PT goals: Progressing toward goals    Frequency    Min 3X/week      PT Plan Current plan remains appropriate       AM-PAC PT "6 Clicks" Mobility   Outcome Measure  Help needed turning from your back to your side while in a flat bed without using bedrails?: None Help needed moving from lying on your back to sitting on the side of a flat bed without using bedrails?: None Help needed moving to and from a bed to a chair (including a wheelchair)?: A Little Help needed standing up from a chair using your arms (e.g., wheelchair or bedside chair)?: A Little Help needed to walk in hospital room?: A Little Help needed climbing 3-5 steps with a railing? : A Lot 6 Click Score: 19    End of Session Equipment Utilized During Treatment: Gait belt Activity Tolerance: Patient tolerated treatment well Patient left: in bed;with call bell/phone within reach;with bed alarm set Nurse Communication: Mobility status PT Visit Diagnosis: Unsteadiness on feet (R26.81);Other abnormalities of gait and mobility (R26.89);Muscle weakness (generalized) (M62.81);Difficulty in walking, not elsewhere classified (R26.2)     Time: [id number] PT Time Calculation (min) (ACUTE ONLY): 22 min  Charges:  $Therapeutic Activity: 8-22 mins                     [name] B. Migdalia Dk PT, DPT Acute Rehabilitation Services Pager [phone] Office [phone]    [place] 12/09/2020, 4:27 PM

## 2020-12-09 NOTE — Progress Notes (Signed)
Notified by RN that pt had pulled on foley earlier this evening. Has had no urine output in past few hours. Has some blood with clots present. Rn has flushed but no return. Has 480 ml in bladder on scan.  Discussed with Urology, Dr. Diona Fanti, who recommended that foley be removed and replaced with 20 Fr. Foley. Current foley may be in urethra and therefore not draining properly. If replacing foley doesn't work will call Urology back to evaluate pt.

## 2020-12-09 NOTE — Progress Notes (Signed)
Inpatient Diabetes Program Recommendations  AACE/ADA: New Consensus Statement on Inpatient Glycemic Control (2015)  Target Ranges:  Prepandial:   less than 140 mg/dL      Peak postprandial:   less than 180 mg/dL (1-2 hours)      Critically ill patients:  140 - 180 mg/dL   Lab Results  Component Value Date   GLUCAP 198 (H) 12/09/2020   HGBA1C 8.6 (H) 11/27/2020    Review of Glycemic Control Results for DERWIN, POKORNEY (MRN [id number]) as of 12/09/2020 09:48  Ref. Range 12/08/2020 20:48 12/08/2020 23:51 12/09/2020 04:02 12/09/2020 07:35  Glucose-Capillary Latest Ref Range: 70 - 99 mg/dL 137 (H) 234 (H) 204 (H) 198 (H)   Diabetes history: Type 2 DM Outpatient Diabetes medications: Metformin 500 mg QAM Current orders for Inpatient glycemic control: Novolog 0-20 units Q4H Osmolite @ 60 ml/hr  Inpatient Diabetes Program Recommendations:    Consider adding Novolog 3 units Q4H (for tube feed coverage to be stopped or held in the event tube feeds held).   Thanks, Bronson Curb, MSN, RNC-OB Diabetes Coordinator [phone] (8a-5p)

## 2020-12-09 NOTE — Progress Notes (Signed)
Nutrition Follow-up  DOCUMENTATION CODES:   Not applicable  INTERVENTION:   -Continue Osmolite 1.5 @ 60 ml/hr via cortrak tube   45 ml Prosource TF BID   Tube feeding regimen provides 2240 kcal, 112 grams of protein, and 1097 ml of H2O.    NUTRITION DIAGNOSIS:   Inadequate oral intake related to inability to eat as evidenced by NPO status.  Ongoing  GOAL:   Patient will meet greater than or equal to 90% of their needs  Met with TF  MONITOR:   PO intake, Diet advancement, Labs, Weight trends, TF tolerance, I & O's  REASON FOR ASSESSMENT:   Ventilator    ASSESSMENT:   75 year old male with PMH of HTN, T2DM, GERD, HOH, hyponatremia, CKD stage II, depression, CVA, OSA, GERD. Pt presented with SBO, acute encephalopathy. Pt required intubation after brief cardiac arrest and initiation of pressor support.  8/28 - intubated 8/30 - extubated 9/02 - diet advanced to clear liquids, Cortrak placed (tip gastric) 9/5- s/p BSE- advanced to dysphagia 1 diet with nectar thick liquids 9/7- s/p MBSS- advanced to dysphagia 1 diet with nectar thick liquids  Reviewed I/O's: -1.4 L x 24 hours and -38 ml since admission  UOP: 1.6 L x 24 hours   Case discussed with nurse tech; breakfast tray unattempted. Pt refused to eat breakfast, as he wasn't hungry.   Pt opened his eyes when name was called, but too lethargic to hold a conversation. Intake remains poor; 0-30%. RD will continue with cortrak until pt able to demonstrate consistent adequate oral intake (>75% of estimated needs).   Palliative care following; pt and family does not desire permanent feeding tube placement.   Labs reviewed: CBGS: 198-280 (inpatient orders for glycemic control are 0-20 units insulin aspart every 4 hours).    Diet Order:   Diet Order             DIET - DYS 1 Room service appropriate? Yes; Fluid consistency: Honey Thick  Diet effective now                   EDUCATION NEEDS:   No education  needs have been identified at this time  Skin:  Skin Assessment: Reviewed RN Assessment  Last BM:  12/09/20  Height:   Ht Readings from Last 1 Encounters:  11/29/20 $RemoveB'5\' 11"'[id number]$  (1.803 m)    Weight:   Wt Readings from Last 1 Encounters:  12/09/20 87.5 kg   BMI:  Body mass index is 26.9 kg/m.  Estimated Nutritional Needs:   Kcal:  2100-2300  Protein:  110-130 grams  Fluid:  >/= 2.0 L since admit    Loistine Chance, RD, LDN, [place] Registered Dietitian II Certified Diabetes Care and Education Specialist Please refer to [facility] for RD and/or RD on-call/weekend/after hours pager

## 2020-12-09 NOTE — Progress Notes (Signed)
PROGRESS NOTE    Albert Garrison   YCX:[id number]  DOB: [birth year]  PCP: Celene Squibb, MD    DOA: 11/27/2020 LOS: 18    Brief Narrative / Hospital Course to Date:   75 year old male with history of hypertension, hyperlipidemia, former smoker, DM2, history of lower GI bleed, chronic kidney disease stage II who initially presented to [facility] with complaints of nausea, poor p.o. intake and decreased appetite.  Admitted on 8/26.  Patient was hyponatremic with sodium 118 with AKI and leukocytosis.  Abdominal x-ray on hospital day 2 showed a small bowel obstruction and general surgery was consulted.  CT scan abdomen pelvis showed a transition point in the right mid abdomen.    Hospital course complicated by increased ectopy on telemetry and ultimately patient had a cardiac arrest late evening of 8/28.  He became unresponsible and seen to have agonal respirations.  CODE BLUE was called and ROSC was achieved in about 5 minutes.  Due to concern for new onset left bundle branch block/STEMI, patient was started on heparin and amiodarone.  Cardiology was consulted and patient was transferred to [facility].  Significant events 8/26 Admit to [facility] 8/26 N/V 8/27 KUB with concern for distal SBO. ECHO with LVEF 55-60%, no RWMA, grade I diastolic dysfunction 4/97 CT ABD with small bowel obstruction with likely transition point in the right mid abdomen.  CT Head negative.  8/28 cardiac arrest >>NSVT with somewhat more sustained runs, progressive IVCD by ECG, reportedly bradycardia and unresponsiveness.  He was treated with atropine and epinephrine, had compressions under a minute with ROSC, no shocks delivered.  He was intubated and did require initiation of pressors including Levophed and vasopressin, also started on amiodarone 8/29 Transfer to [facility] for surgical / cardiac ongoing evaluation 8/29 Echo LVEF 65-70%, otherwise grossly normal.  8/30 extubated  9/1-TRH assumed care  Assessment &  Plan   Principal Problem:   SBO (small bowel obstruction) (HCC) Active Problems:   Hyponatremia   Acute encephalopathy   Type 2 diabetes mellitus ([place])   Acute hypoxemic respiratory failure (HCC)   Shock circulatory ([place])   Cardiac arrest ([place])   Other specified cardiac arrhythmias--- sleep apnea- arrhythmias when he desaturates    Endotracheally intubated   NSVT, bradycardic arrest, concern for LBBB/STEMI -  Status post CODE BLUE cardiac arrest requiring resuscitation. Cardiology following, follow-up recommendations. Initially on amiodarone, has transitioned to metoprolol. Treated with IV heparin for few days, discontinued. Lovenox for VTE prophylaxis Plans for cardiac cath pending POA approval.  Small bowel obstruction -resolved.  Present on admission with CT showing transition point in the right mid abdomen.  General surgery was consulted and followed patient throughout admission.  Obstruction improved with conservative management however patient failed swallow evaluation due to altered mental status.  Core track in place and on tube feeds for nutrition.  Dysphagia -SLP following, appreciate recommendations.  Continue with core track and tube feeds per RD  Acute metabolic encephalopathy -MRI brain was obtained on 9/3 due to persistent confusion, it showed no evidence for anoxic brain injury.  Mental status slowly improving.  AKI superimposed on CKD stage II -renal function now near baseline (creatinine approximately 1.2) -- Monitor renal function  Fever -resolved.  Thought due to aspiration during his cardiac arrest event.  He completed 5 days of Unasyn.  Monitor clinically off antibiotics.  Symptomatic hyponatremia -improved and sodium level improving.  Continue to monitor BMP.  Acute respiratory failure with hypoxia in the setting  of cardiac arrest -resolved.  Required intubation and was extubated on 8/30.  Monitor O2 sat and supplement to maintain sats above 90%, wean as  tolerated  Type 2 diabetes -uncontrolled, A1c 8.6%.  Continue sliding scale NovoLog.  Hypertension -continue metoprolol     Patient BMI: Body mass index is 26.9 kg/m.   DVT prophylaxis: enoxaparin (LOVENOX) injection 40 mg Start: 12/03/20 1600 SCDs Start: 11/27/20 1854 Place TED hose Start: 11/27/20 1854   Diet:  Diet Orders (From admission, onward)     Start     Ordered   12/09/20 1412  DIET - DYS 1 Room service appropriate? Yes; Fluid consistency: Honey Thick  Diet effective now       Question Answer Comment  Room service appropriate? Yes   Fluid consistency: Honey Thick      12/09/20 1411              Code Status: DNR   Subjective 12/09/20    Patient seen awake resting in bed this morning.  He was pleasantly confused.  Michela Pitcher he had been sick all afternoon.  Breakfast tray with dysphagia diet not touched.  States tolerating tube feeds and denies abdominal pain or nausea.  No other acute complaints or events reported.   Disposition Plan & Communication   Status is: Inpatient  Remains inpatient appropriate because:Inpatient level of care appropriate due to severity of illness  Dispo: The patient is from: Home              Anticipated d/c is to: SNF              Patient currently is not medically stable to d/c.   Difficult to place patient No    Consults, Procedures, Significant Events   Consultants:  PCCM General surgery Cardiology Palliative care  Procedures:  2D echo:  1. Left ventricular ejection fraction, by estimation, is 65 to 70%. The left ventricle has normal function. The left ventricle has no regional wall motion abnormalities. There is mild left ventricular hypertrophy. Left ventricular diastolic parameters are indeterminate.   2. Right ventricular systolic function is normal. The right ventricular size is normal. Tricuspid regurgitation signal is inadequate for assessing PA pressure.   3. The mitral valve is grossly normal.   4. Aortic  valve regurgitation is mild.   5. Aortic dilatation noted. There is mild dilatation of the aortic root, measuring 42 mm.  Antimicrobials:  Anti-infectives (From admission, onward)    Start     Dose/Rate Route Frequency Ordered Stop   12/02/20 1511  ampicillin-sulbactam (UNASYN) 1.5 g in sodium chloride 0.9 % 100 mL IVPB        1.5 g 200 mL/hr over 30 Minutes Intravenous Every 6 hours 12/02/20 1324 12/05/20 0903   12/01/20 1630  ampicillin-sulbactam (UNASYN) 1.5 g in sodium chloride 0.9 % 100 mL IVPB  Status:  Discontinued        1.5 g 200 mL/hr over 30 Minutes Intravenous Every 6 hours 12/01/20 1531 12/01/20 1534   12/01/20 1630  ampicillin-sulbactam (UNASYN) 1.5 g in sodium chloride 0.9 % 100 mL IVPB  Status:  Discontinued        1.5 g 200 mL/hr over 30 Minutes Intravenous Every 8 hours 12/01/20 1534 12/02/20 1324   11/30/20 1000  vancomycin (VANCOREADY) IVPB 750 mg/150 mL  Status:  Discontinued        750 mg 150 mL/hr over 60 Minutes Intravenous Every 12 hours 11/30/20 0043 12/01/20 0937   11/30/20 0130  vancomycin (VANCOREADY) IVPB 1500 mg/300 mL        1,500 mg 150 mL/hr over 120 Minutes Intravenous  Once 11/30/20 0043 11/30/20 0511   11/30/20 0130  ceFEPIme (MAXIPIME) 2 g in sodium chloride 0.9 % 100 mL IVPB  Status:  Discontinued        2 g 200 mL/hr over 30 Minutes Intravenous Every 12 hours 11/30/20 0043 12/01/20 0937         Micro    Objective   Vitals:   12/08/20 2101 12/09/20 0000 12/09/20 0738 12/09/20 1050  BP: (!) 168/73 131/76 (!) 145/78 130/78  Pulse: 78 64 70 71  Resp:  (!) _0 Temp:  98.6 F (37 C) 98.4 F (36.9 C) 98.3 F (36.8 C)  TempSrc:  Oral Oral Oral  SpO2:  94%    Weight:  87.5 kg    Height:        Intake/Output Summary (Last 24 hours) at 12/09/2020 1550 Last data filed at 12/09/2020 1521 Gross per 24 hour  Intake 112 ml  Output 1300 ml  Net -1188 ml   Filed Weights   12/07/20 0400 12/08/20 0500 12/09/20 0000  Weight: 85.8 kg 84.7  kg 87.5 kg    Physical Exam:  General exam: awake, alert, no acute distress, pleasantly confused HEENT: core track in place with continuous tube feeds running moist mucus membranes, hearing grossly normal  Respiratory system: Decreased breath sounds with poor inspiratory effort, normal respiratory effort. Cardiovascular system: normal S1/S2, RRR, no pedal edema.   Gastrointestinal system: soft, nontender and nondistended Central nervous system: no gross focal neurologic deficits, normal speech Extremities: moves all, no edema, normal tone Psychiatry: normal mood, congruent affect, abnormal judgment and insight due to confusion  Labs   Data Reviewed: I have personally reviewed following labs and imaging studies  CBC: Recent Labs  Lab 12/03/20 0217 12/06/20 0300 12/09/20 0150  WBC 11.0* 10.9* 14.1*  HGB 12.4* 12.0* 12.7*  HCT 37.1* 36.2* 38.1*  MCV 90.0 89.8 89.9  PLT 340 364 102*   Basic Metabolic Panel: Recent Labs  Lab 12/04/20 0259 12/04/20 1821 12/05/20 0431 12/05/20 1723 12/05/20 2057 12/06/20 0300 12/07/20 0552 12/09/20 0150  NA 137  --   --   --  136 138 133* 131*  K 3.3* 3.8  --   --  4.0 3.7 4.0 4.3  CL 104  --   --   --  105 104 100 100  CO2 22  --   --   --  _1 GLUCOSE 114*  --   --   --  234* 112* 208* 262*  BUN 8  --   --   --  _2 CREATININE 1.14  --   --   --  1.19 1.17 1.08 1.18  CALCIUM 7.8*  --   --   --  8.2* 8.2* 8.4* 8.3*  MG 1.9 2.1 1.9 2.0  --  2.0 2.0  --   PHOS 2.5 2.1* 2.6 3.3  --  3.5 2.8  --    GFR: Estimated Creatinine Clearance: 56.7 mL/min (by C-G formula based on SCr of 1.18 mg/dL). Liver Function Tests: Recent Labs  Lab 12/04/20 0259 12/06/20 0300 12/07/20 0552  AST 32 33 34  ALT 28 31 35  ALKPHOS 44 42 47  BILITOT 0.7 0.5 0.6  PROT 4.9* 5.0* 5.8*  ALBUMIN 2.1* 2.1* 2.4*   No results for input(s): LIPASE, AMYLASE in  the last 168 hours. No results for input(s): AMMONIA in the last 168  hours. Coagulation Profile: No results for input(s): INR, PROTIME in the last 168 hours. Cardiac Enzymes: No results for input(s): CKTOTAL, CKMB, CKMBINDEX, TROPONINI in the last 168 hours. BNP (last 3 results) No results for input(s): PROBNP in the last 8760 hours. HbA1C: No results for input(s): HGBA1C in the last 72 hours. CBG: Recent Labs  Lab 12/08/20 2048 12/08/20 2351 12/09/20 0402 12/09/20 0735 12/09/20 1053  GLUCAP 137* 234* 204* 198* 280*   Lipid Profile: Recent Labs    12/07/20 0552  TRIG 134   Thyroid Function Tests: No results for input(s): TSH, T4TOTAL, FREET4, T3FREE, THYROIDAB in the last 72 hours. Anemia Panel: No results for input(s): VITAMINB12, FOLATE, FERRITIN, TIBC, IRON, RETICCTPCT in the last 72 hours. Sepsis Labs: No results for input(s): PROCALCITON, LATICACIDVEN in the last 168 hours.  Recent Results (from the past 240 hour(s))  Culture, blood (Routine X 2) w Reflex to ID Panel     Status: None   Collection Time: 11/30/20 12:35 PM   Specimen: Right Antecubital; Blood  Result Value Ref Range Status   Specimen Description   Final    RIGHT ANTECUBITAL BOTTLES DRAWN AEROBIC AND ANAEROBIC   Special Requests Blood Culture adequate volume  Final   Culture   Final    NO GROWTH 5 DAYS Performed at [facility], [address], [place], [place] [address]    Report Status 12/05/2020 FINAL  Final  Culture, blood (Routine X 2) w Reflex to ID Panel     Status: None   Collection Time: 11/30/20 12:35 PM   Specimen: BLOOD LEFT HAND  Result Value Ref Range Status   Specimen Description   Final    BLOOD LEFT HAND BOTTLES DRAWN AEROBIC AND ANAEROBIC   Special Requests Blood Culture adequate volume  Final   Culture   Final    NO GROWTH 5 DAYS Performed at [facility], [address] [place], [place] [address]    Report Status 12/05/2020 FINAL  Final  Culture, Respiratory w Gram Stain     Status: None   Collection Time: 12/01/20  8:30 AM   Specimen:  Tracheal Aspirate  Result Value Ref Range Status   Specimen Description TRACHEAL ASPIRATE  Final   Special Requests NONE  Final   Gram Stain   Final    ABUNDANT WBC PRESENT, PREDOMINANTLY PMN ABUNDANT GRAM NEGATIVE RODS FEW GRAM POSITIVE COCCI IN CHAINS    Culture   Final    RARE Normal respiratory flora-no Staph aureus or Pseudomonas seen Performed at [facility], [place] [address], [place], [place] [address]    Report Status 12/03/2020 FINAL  Final      Imaging Studies   DG Swallowing Func-Speech Pathology  Result Date: 12/09/2020 Table formatting from the original result was not included. Objective Swallowing Evaluation: Type of Study: MBS-Modified Barium Swallow Study  Patient Details Name: AMERE BRICCO MRN: [id number] Date of Birth: [birth year] Today's Date: 12/09/2020 Time: SLP Start Time (ACUTE ONLY): 1300 -SLP Stop Time (ACUTE ONLY): 1325 SLP Time Calculation (min) (ACUTE ONLY): 25 min Past Medical History: Past Medical History: Diagnosis Date  Arthritis   BPH (benign prostatic hyperplasia)   CKD (chronic kidney disease), stage II   Complicated UTI (urinary tract infection) 03/2014  Depression   GERD (gastroesophageal reflux disease)   History of pneumonia   History of stroke   HOH (hard of hearing)   Hypertension  Hyponatremia   Lower GI bleed 2017  a. ? due to polyp.  NSVT (nonsustained ventricular tachycardia) (HCC)   Rheumatic fever   Childhood  Sleep apnea   Does not use CPAP  Type 2 diabetes mellitus ([place])  Past Surgical History: Past Surgical History: Procedure Laterality Date  CARPAL TUNNEL RELEASE Left 2010  CARPAL TUNNEL RELEASE Right 07/13/2017  Procedure: CARPAL TUNNEL RELEASE;  Surgeon: Carole Civil, MD;  Location: [facility] ORS;  Service: Orthopedics;  Laterality: Right;  CERVICAL SPINE SURGERY  2010  COLONOSCOPY WITH PROPOFOL N/A 11/30/2015  Procedure: COLONOSCOPY WITH PROPOFOL;  Surgeon: Daneil Dolin, MD;  Location: [facility] END[facility] SUITE;  Service: Endoscopy;  Laterality: N/A;   KNEE ARTHROSCOPY  [facility] surgery - broken nose    POLYPECTOMY  11/30/2015  Procedure: POLYPECTOMY;  Surgeon: Daneil Dolin, MD;  Location: [facility] END[facility] SU[facility];  Service: Endoscopy;;  polyp at ascending colon, rectal polyp  TRANSURETHRAL INCISION OF PROSTATE N/A 01/20/2015  Procedure: TRANSURETHRAL INCISION OF THE PROSTATE (TUIP);  Surgeon: Irine Seal, MD;  Location: [facility] ORS;  Service: Urology;  Laterality: N/A; HPI: 75 y.o. male presented to [facility] ED with diarrhea, decreased p.o. intake and nausea. Pt admitted with acute encephalopathy and symptomatic hyponatremia. Abdominal distension 8/27. CT abd/pelvis (8/28) (+) SBO and NGT placed. 8/28 pt went into cardiac arrest, concern for possible STEMI. Transferred to [facility] for further treatment. CT head (8/28) revealed no acute intracranial abnormality. Chest xray (8/30) noted "appearance of airspace disease in the right middle lobe. Small left pleural effusion". Hosptial admission complicated by continued impulsivity and agitation with pt pulling out multiple NGT's despite restraints.  ETT: 8/28-8/30. PMHx significant for CKD, BPH, DMII, Hx of CVA, HTN, and GERD. SLE (08/23/2017) revealed cognitive communication deficits post CVA.  Subjective: Pt seen in radiology for instrumental assessment of swallow function and safety. No family present. Assessment / Plan / Recommendation CHL IP CLINICAL IMPRESSIONS 12/09/2020 Clinical Impression Pt presents with a moderate-severe oral and pharyngeal dysphagia, characterized as follows: Orally, pt demonstrates poor bolus formation and control, with premature spillage over the tongue base across consistencies. Pt was noted to piecemeal several boluses, swallowing small amounts of the bolus at one time. Oral residue was also noted, which then spilled posteriorly into the vallecular sinus. Pharyngeal swallow is characterized by delayed swallow reflex, with trigger occurring at the level of the pyriform sinuses on nectar  and honey thick liquids (cup sip), and at the vallecular sinus on honey (tsp) and puree. Epiglottic inversion was reduced, in large part by the presence of the Cortrak feeding tube. This resulted in vallecular residue across consistencies. This increases aspiration risk, as residue thins with saliva, or is added to by subsequent boluses and can potentially spill over the vallecular sinus and into the open airway. SILENT ASPIRATION was seen on nectar thick liquids via teaspoon and cup sip. Flash penetration was noted during the swallow of honey thick liquids and puree. Pt was noted to be impulsive with cup sips, and has difficulty following commands due to confusion and hearing loss. Pt is at significantly high risk of aspiration of any consistency given. Puree and honey thick liquids may be presented in small bites/sips with 1:1 close supervision. Meds should be crushed in puree or given via Cortrak.  SLP Visit Diagnosis Dysphagia, oropharyngeal phase (R13.12)     Impact on safety and function Severe aspiration risk   CHL IP TREATMENT RECOMMENDATION 12/09/2020 Treatment Recommendations Therapy as outlined in  treatment plan below   Prognosis 12/09/2020 Prognosis for Safe Diet Advancement Fair Barriers to Reach Goals Cognitive deficits   CHL IP DIET RECOMMENDATION 12/09/2020 SLP Diet Recommendations Dysphagia 1 (Puree) solids;Honey thick liquids Liquid Administration via Cup;No straw;Spoon Medication Administration Crushed with puree Compensations Minimize environmental distractions;Slow rate;Small sips/bites Postural Changes Seated upright at 90 degrees;Remain semi-upright after after feeds/meals (Comment)   CHL IP OTHER RECOMMENDATIONS 12/09/2020   Oral Care Recommendations Oral care QID Other Recommendations Order thickener from pharmacy   CHL IP FOLLOW UP RECOMMENDATIONS 12/09/2020 Follow up Recommendations TBD   CHL IP FREQUENCY AND DURATION 12/09/2020 Speech Therapy Frequency (ACUTE ONLY) min 2x/week Treatment Duration 2  weeks      CHL IP ORAL PHASE 12/09/2020 Oral Phase Impaired  Oral - Honey Teaspoon Premature spillage;Other (Comment) Oral - Honey Cup Premature spillage;Other (Comment);Piecemeal swallowing Oral - Nectar Teaspoon Other (Comment);Premature spillage Oral - Nectar Cup Premature spillage;Other (Comment);Piecemeal swallowing Oral - Puree Piecemeal swallowing;Premature spillage   CHL IP PHARYNGEAL PHASE 12/09/2020 Pharyngeal Phase Impaired Pharyngeal- Honey Teaspoon Delayed swallow initiation-vallecula;Pharyngeal residue - valleculae;Penetration/Aspiration during swallow Pharyngeal Material enters airway, remains ABOVE vocal cords then ejected out Pharyngeal- Honey Cup Delayed swallow initiation-pyriform sinuses;Pharyngeal residue - valleculae;Penetration/Aspiration during swallow Pharyngeal Material enters airway, remains ABOVE vocal cords then ejected out Pharyngeal- Nectar Teaspoon Delayed swallow initiation-pyriform sinuses;Pharyngeal residue - valleculae;Penetration/Aspiration during swallow Pharyngeal Material enters airway, passes BELOW cords without attempt by patient to eject out (silent aspiration) Pharyngeal- Nectar Cup Delayed swallow initiation-pyriform sinuses;Pharyngeal residue - valleculae;Penetration/Aspiration during swallow Pharyngeal Material enters airway, passes BELOW cords without attempt by patient to eject out (silent aspiration) Pharyngeal- Puree Delayed swallow initiation-vallecula;Pharyngeal residue - valleculae;Penetration/Aspiration during swallow Pharyngeal Material enters airway, remains ABOVE vocal cords and not ejected out  CHL IP CERVICAL ESOPHAGEAL PHASE 12/09/2020 Cervical Esophageal Phase [facility] Celia B. Bueche, [place], [place] Speech Language Pathologist Office: [phone] Shonna Chock 12/09/2020, 2:00 PM                Medications   Scheduled Meds:  amLODipine  10 mg Oral Daily   bisacodyl  10 mg Rectal QHS   chlorhexidine gluconate (MEDLINE KIT)  15 mL Mouth Rinse BID    Chlorhexidine Gluconate Cloth  6 each Topical Daily   enoxaparin (LOVENOX) injection  40 mg Subcutaneous Q24H   feeding supplement (PROSource TF)  45 mL Per Tube BID   insulin aspart  0-20 Units Subcutaneous Q4H   mouth rinse  15 mL Mouth Rinse q12n4p   metoprolol tartrate  25 mg Per Tube BID   sodium chloride flush  3 mL Intravenous Q12H   sodium chloride flush  3 mL Intravenous Q12H   Continuous Infusions:  sodium chloride Stopped (12/04/20 1802)   sodium chloride Stopped (11/30/20 0009)   feeding supplement (OSMOLITE 1.5 CAL) 1,000 mL (12/09/20 0411)       LOS: 11 days    Time spent: 30 minutes    Ezekiel Slocumb, DO [facility]  12/09/2020, 3:50 PM      If 7PM-7AM, please contact night-coverage. How to contact the [facility] Attending or Consulting provider [place] or covering provider during after hours [place], for this patient?    Check the care team in [facility] and look for a) attending/consulting TR[facility] provider listed and b) the [facility] team listed Log into [email or web address] and use [place] universal password to access. If you do not have the password, please contact the hospital operator. Locate the [facility] provider you are looking for under  [facility] and page to a number that you can be directly reached. If you still have difficulty reaching the provider, please page the [facility] (Director on Call) for the Hospitalists listed on amion for assistance.

## 2020-12-09 NOTE — Progress Notes (Signed)
Palliative Medicine RN Note: Rec'd a call from Abundio Miu, pt's aunt, who needs a letter stating pt is admitted to hospital so she can try to pay his bills. Letter written and mailed to her at [place], [place], [place] [address].  Marjie Skiff Xai Frerking, RN, BSN, [facility] Palliative Medicine Team 12/09/2020 11:41 AM Office [phone]

## 2020-12-09 NOTE — Progress Notes (Signed)
Progress Note  Patient Name: Albert Garrison Date of Encounter: 12/09/2020  [facility] Cardiologist: Carlyle Dolly, MD   Subjective   Patient complains of nausea.  No CP or dyspnea  Inpatient Medications    Scheduled Meds:  amLODipine  10 mg Oral Daily   bisacodyl  10 mg Rectal QHS   chlorhexidine gluconate (MEDLINE KIT)  15 mL Mouth Rinse BID   Chlorhexidine Gluconate Cloth  6 each Topical Daily   enoxaparin (LOVENOX) injection  40 mg Subcutaneous Q24H   feeding supplement (PROSource TF)  45 mL Per Tube BID   insulin aspart  0-20 Units Subcutaneous Q4H   mouth rinse  15 mL Mouth Rinse q12n4p   metoprolol tartrate  25 mg Per Tube BID   sodium chloride flush  3 mL Intravenous Q12H   sodium chloride flush  3 mL Intravenous Q12H   Continuous Infusions:  sodium chloride Stopped (12/04/20 1802)   sodium chloride Stopped (11/30/20 0009)   feeding supplement (OSMOLITE 1.5 CAL) 1,000 mL (12/09/20 0411)   PRN Meds: sodium chloride, acetaminophen **OR** acetaminophen, bisacodyl, guaiFENesin-dextromethorphan, LORazepam, menthol-cetylpyridinium, ondansetron **OR** ondansetron (ZOFRAN) IV, sodium chloride flush   Vital Signs    Vitals:   12/08/20 2005 12/08/20 2101 12/09/20 0000 12/09/20 0738  BP: (!) 152/76 (!) 168/73 131/76 (!) 145/78  Pulse: 74 78 64 70  Resp: (!) 22  (!) 24 20  Temp: 98.5 F (36.9 C)  98.6 F (37 C) 98.4 F (36.9 C)  TempSrc: Oral  Oral Oral  SpO2: 97%  94%   Weight:   87.5 kg   Height:        Intake/Output Summary (Last 24 hours) at 12/09/2020 0915 Last data filed at 12/09/2020 0600 Gross per 24 hour  Intake 162 ml  Output 1550 ml  Net -1388 ml   Last 3 Weights 12/09/2020 12/08/2020 12/07/2020  Weight (lbs) 192 lb 14.4 oz 186 lb 11.7 oz 189 lb 1.6 oz  Weight (kg) 87.5 kg 84.7 kg 85.775 kg      Telemetry    Sinus with PVCs and NSVT - Personally Reviewed   Physical Exam   GEN: No acute distress.   Neck: No JVD Cardiac: RRR Respiratory:  Clear to auscultation bilaterally. GI: Soft, mildly tender MS: No edema Neuro:  Nonfocal  Psych: Normal affect   Labs    High Sensitivity Troponin:   Recent Labs  Lab 11/29/20 2243 11/30/20 0224 11/30/20 0503 11/30/20 0730  TROPONINIHS 116* 263* 306* 285*      Chemistry Recent Labs  Lab 12/04/20 0259 12/04/20 1821 12/06/20 0300 12/07/20 0552 12/09/20 0150  NA 137   < > 138 133* 131*  K 3.3*   < > 3.7 4.0 4.3  CL 104   < > 104 100 100  CO2 22   < > $R'27 26 24  'XL$ GLUCOSE 114*   < > 112* 208* 262*  BUN 8   < > $R'15 14 19  'oP$ CREATININE 1.14   < > 1.17 1.08 1.18  CALCIUM 7.8*   < > 8.2* 8.4* 8.3*  PROT 4.9*  --  5.0* 5.8*  --   ALBUMIN 2.1*  --  2.1* 2.4*  --   AST 32  --  33 34  --   ALT 28  --  31 35  --   ALKPHOS 44  --  42 47  --   BILITOT 0.7  --  0.5 0.6  --   GFRNONAA >60   < > >  60 >60 >60  ANIONGAP 11   < > $R'7 7 7   'GW$ < > = values in this interval not displayed.     Hematology Recent Labs  Lab 12/03/20 0217 12/06/20 0300 12/09/20 0150  WBC 11.0* 10.9* 14.1*  RBC 4.12* 4.03* 4.24  HGB 12.4* 12.0* 12.7*  HCT 37.1* 36.2* 38.1*  MCV 90.0 89.8 89.9  MCH 30.1 29.8 30.0  MCHC 33.4 33.1 33.3  RDW 14.1 14.2 13.5  PLT 340 364 500*    Patient Profile     75 y.o. male with history of CVA 2019, PVCs, NSVT, DM2, hyponatremia, hypertension, CKD stage II, KPH, sleep apnea (not on CPAP). Previously seen by cardiology - a) hx elevated troponin in setting of stroke (echo with normal LVEF at that time) and PVCs, brief NSVT previously seen on telemetry, managed conservatively. Presented to the hospital with SBO, hyponatremia (Na 118 on 8/26) and acute encephalopathy. During admission he developed NSVT then more sustained runs, progressive IVCD by EKG, then reportedly  bradycardia and unresponsiveness. He had compressions for under a minute with ROSC, no shocks delivered. He was intubated for hypoxic respiratory failure and required pressor support. He was transferred to [facility] for  further management. Hospital course also complicated by dysphagia (requiring CorTrak), persistent confusion, AKI on CKD stage II, fever (felt possibly due to aspiration during arrest s/p Unasyn). Troponin elevated to 306.   Assessment & Plan    1 history of cardiac arrest with nonsustained ventricular tachycardia followed by bradycardia-echocardiogram shows ejection fraction 65 to 70%, mild left ventricular hypertrophy, mild aortic insufficiency, mildly dilated aortic root.  Plan previously had been cardiac catheterization to exclude coronary disease.  However the patient remains extremely confused and is alert and oriented x1 (he feels that the year is 2006 and that he is in reasonable; stated he was going fishing this morning).  I do not think it is safe to proceed with catheterization at this point as I am concerned about his ability to cooperate.  Will not proceed for now.  2 confusion-in discussions with his daughter-in-law yesterday he apparently was having some confusion prior to his recent events.  I wonder if he has baseline dementia which has now worsened following recent episode at [facility].  3 nonsustained ventricular tachycardia-continue beta-blocker.  4 recent small bowel obstruction-management per primary service.  For questions or updates, please contact [place] Please consult [email or web address] for contact info under        Signed, Kirk Ruths, MD  12/09/2020, 9:15 AM

## 2020-12-10 DIAGNOSIS — E871 Hypo-osmolality and hyponatremia: Secondary | ICD-10-CM | POA: Diagnosis not present

## 2020-12-10 DIAGNOSIS — J9601 Acute respiratory failure with hypoxia: Secondary | ICD-10-CM | POA: Diagnosis not present

## 2020-12-10 DIAGNOSIS — I469 Cardiac arrest, cause unspecified: Secondary | ICD-10-CM | POA: Diagnosis not present

## 2020-12-10 DIAGNOSIS — G9340 Encephalopathy, unspecified: Secondary | ICD-10-CM | POA: Diagnosis not present

## 2020-12-10 DIAGNOSIS — K56609 Unspecified intestinal obstruction, unspecified as to partial versus complete obstruction: Secondary | ICD-10-CM | POA: Diagnosis not present

## 2020-12-10 LAB — CBC
HCT: 35.8 % — ABNORMAL LOW (ref 39.0–52.0)
Hemoglobin: 12.1 g/dL — ABNORMAL LOW (ref 13.0–17.0)
MCH: 30.2 pg (ref 26.0–34.0)
MCHC: 33.8 g/dL (ref 30.0–36.0)
MCV: 89.3 fL (ref 80.0–100.0)
Platelets: 460 10*3/uL — ABNORMAL HIGH (ref 150–400)
RBC: 4.01 MIL/uL — ABNORMAL LOW (ref 4.22–5.81)
RDW: 13.7 % (ref 11.5–15.5)
WBC: 15.7 10*3/uL — ABNORMAL HIGH (ref 4.0–10.5)
nRBC: 0 % (ref 0.0–0.2)

## 2020-12-10 LAB — COMPREHENSIVE METABOLIC PANEL
ALT: 22 U/L (ref 0–44)
AST: 19 U/L (ref 15–41)
Albumin: 2.4 g/dL — ABNORMAL LOW (ref 3.5–5.0)
Alkaline Phosphatase: 50 U/L (ref 38–126)
Anion gap: 7 (ref 5–15)
BUN: 22 mg/dL (ref 8–23)
CO2: 24 mmol/L (ref 22–32)
Calcium: 8.1 mg/dL — ABNORMAL LOW (ref 8.9–10.3)
Chloride: 103 mmol/L (ref 98–111)
Creatinine, Ser: 1.1 mg/dL (ref 0.61–1.24)
GFR, Estimated: >60 mL/min (ref >60)
Glucose, Bld: 240 mg/dL — ABNORMAL HIGH (ref 70–99)
Potassium: 4.2 mmol/L (ref 3.5–5.1)
Sodium: 134 mmol/L — ABNORMAL LOW (ref 135–145)
Total Bilirubin: 0.5 mg/dL (ref 0.3–1.2)
Total Protein: 5.7 g/dL — ABNORMAL LOW (ref 6.5–8.1)

## 2020-12-10 LAB — MAGNESIUM: Magnesium: 2 mg/dL (ref 1.7–2.4)

## 2020-12-10 LAB — GLUCOSE, CAPILLARY
Glucose-Capillary: 160 mg/dL — ABNORMAL HIGH (ref 70–99)
Glucose-Capillary: 183 mg/dL — ABNORMAL HIGH (ref 70–99)
Glucose-Capillary: 219 mg/dL — ABNORMAL HIGH (ref 70–99)
Glucose-Capillary: 225 mg/dL — ABNORMAL HIGH (ref 70–99)
Glucose-Capillary: 233 mg/dL — ABNORMAL HIGH (ref 70–99)
Glucose-Capillary: 235 mg/dL — ABNORMAL HIGH (ref 70–99)

## 2020-12-10 LAB — PHOSPHORUS: Phosphorus: 2.8 mg/dL (ref 2.5–4.6)

## 2020-12-10 NOTE — Progress Notes (Signed)
20Fr foley placed with urine output. Peri care done prior and pt was thoroughly cleaned after with foley care as well. Pt continues to have bloody drainage from tip of penis. MD aware

## 2020-12-10 NOTE — Progress Notes (Signed)
Occupational Therapy Treatment Patient Details Name: Albert Garrison MRN: [id number] DOB: 09-24-1945 Today's Date: 12/10/2020    History of present illness 75 y.o. male presenting to [facility] ED 8/29 with diarrhea, decreased p.o. intake and nausea. Transferred to [facility]. Patient admitted with acute encephalopathy and symptomatic hyponatremia. Abdominal distension 8/27. CT abd (+) SBO and NGT placed. Hosptial admission complicated by continued impulsivity and agitation with patient pulling out multiple NGT's despite restraints. 8/28 wide QRS with frequent consecutive PVCs and LBBB. Bradycardic and hypotensive followed by loss of pulse. Code blue called. ROSC achieved after several mins of CPR. Troponin peaked at 309. Concern for possible STEMI with cardiac cath scheduled 12/08/20.  PRVC 8/28-8/30. Cardiology following. PMHx significant for CKD, BPH, DMII, Hx of CVA, HTN, and GERD.   OT comments  Pt reporting need to sit on BSC, min assist with RW, declined walking to bathroom, but did not have BM. Pt participated in grooming seated at EOB with min assist and ate a few bites of pureed food with encouragement. Pt remains disoriented, worked on using wall calendar. Pt without complaints.   Follow Up Recommendations  SNF;Supervision/Assistance - 24 hour    Equipment Recommendations  Other (comment) (defer to next venue)    Recommendations for Other Services      Precautions / Restrictions Precautions Precautions: Fall       Mobility Bed Mobility Overal bed mobility: Needs Assistance Bed Mobility: Supine to Sit;Sit to Supine     Supine to sit: Min guard;HOB elevated Sit to supine: Min guard   General bed mobility comments: min guard for safety, vc for sequencing    Transfers Overall transfer level: Needs assistance Equipment used: Rolling walker (2 wheeled) Transfers: Sit to/from [facility] Sit to Stand: Min assist Stand pivot transfers: Min assist       General transfer  comment: steadying assist    Balance Overall balance assessment: Needs assistance Sitting-balance support: Single extremity supported Sitting balance-Leahy Scale: Poor   Postural control: Posterior lean Standing balance support: Bilateral upper extremity supported Standing balance-Leahy Scale: Poor                             ADL either performed or assessed with clinical judgement   ADL Overall ADL's : Needs assistance/impaired Eating/Feeding: Minimal assistance;Sitting Eating/Feeding Details (indicate cue type and reason): at EOB Grooming: Wash/dry hands;Sitting;Minimal assistance                   Toilet Transfer: Minimal assistance;Stand-pivot;BSC;RW   Toileting- Clothing Manipulation and Hygiene: Total assistance;Sit to/from stand               Vision       Perception     Praxis      Cognition Arousal/Alertness: Awake/alert Behavior During Therapy: Impulsive Overall Cognitive Status: Impaired/Different from baseline Area of Impairment: Orientation;Attention;Following commands;Safety/judgement;Awareness;Problem solving;Memory                 Orientation Level: Disoriented to;Place;Time;Situation Current Attention Level: Focused Memory: Decreased short-term memory;Decreased recall of precautions Following Commands: Follows one step commands inconsistently Safety/Judgement: Decreased awareness of deficits;Decreased awareness of safety Awareness: Intellectual Problem Solving: Slow processing;Decreased initiation;Difficulty sequencing;Requires verbal cues;Requires tactile cues          Exercises     Shoulder Instructions       General Comments      Pertinent Vitals/ Pain       Pain Assessment: Faces Faces Pain  Scale: No hurt  Home Living                                          Prior Functioning/Environment              Frequency  Min 2X/week        Progress Toward Goals  OT Goals(current  goals can now be found in the care plan section)  Progress towards OT goals: Not progressing toward goals - comment (impaired cognition)  Acute Rehab OT Goals Patient Stated Goal: To get better. OT Goal Formulation: With patient/family Time For Goal Achievement: 12/16/20 Potential to Achieve Goals: [place]  Plan Discharge plan remains appropriate    Co-evaluation                 AM-PAC OT "6 Clicks" Daily Activity     Outcome Measure   Help from another person eating meals?: A Little Help from another person taking care of personal grooming?: A Lot Help from another person toileting, which includes using toliet, bedpan, or urinal?: Total Help from another person bathing (including washing, rinsing, drying)?: A Lot Help from another person to put on and taking off regular upper body clothing?: A Little Help from another person to put on and taking off regular lower body clothing?: Total 6 Click Score: 12    End of Session Equipment Utilized During Treatment: Gait belt;Rolling walker  OT Visit Diagnosis: Unsteadiness on feet (R26.81);Muscle weakness (generalized) (M62.81);Other symptoms and signs involving cognitive function   Activity Tolerance Patient tolerated treatment well   Patient Left in bed;with call bell/phone within reach;with bed alarm set   Nurse Communication          Time: [id number] OT Time Calculation (min): 18 min  Charges: OT General Charges $OT Visit: 1 Visit OT Treatments $Self Care/Home Management : 8-22 mins  Albert Garrison, OTR/L Acute Rehabilitation Services Pager: [phone] Office: [phone]    Albert Garrison 12/10/2020, 3:12 PM

## 2020-12-10 NOTE — Progress Notes (Signed)
PROGRESS NOTE    Albert Garrison   CMK:[id number]  DOB: January 16, 1946  PCP: Celene Squibb, MD    DOA: 11/27/2020 LOS: 12    Brief Narrative / Hospital Course to Date:   75 year old male with history of hypertension, hyperlipidemia, former smoker, DM2, history of lower GI bleed, chronic kidney disease stage II who initially presented to [facility] with complaints of nausea, poor p.o. intake and decreased appetite.  Admitted on 8/26.  Patient was hyponatremic with sodium 118 with AKI and leukocytosis.  Abdominal x-ray on hospital day 2 showed a small bowel obstruction and general surgery was consulted.  CT scan abdomen pelvis showed a transition point in the right mid abdomen.    Hospital course complicated by increased ectopy on telemetry and ultimately patient had a cardiac arrest late evening of 8/28.  He became unresponsible and seen to have agonal respirations.  CODE BLUE was called and ROSC was achieved in about 5 minutes.  Due to concern for new onset left bundle branch block/STEMI, patient was started on heparin and amiodarone.  Cardiology was consulted and patient was transferred to [facility].  Significant events 8/26 Admit to [facility] 8/26 N/V 8/27 KUB with concern for distal SBO. ECHO with LVEF 55-60%, no RWMA, grade I diastolic dysfunction 5/69 CT ABD with small bowel obstruction with likely transition point in the right mid abdomen.  CT Head negative.  8/28 cardiac arrest >>NSVT with somewhat more sustained runs, progressive IVCD by ECG, reportedly bradycardia and unresponsiveness.  He was treated with atropine and epinephrine, had compressions under a minute with ROSC, no shocks delivered.  He was intubated and did require initiation of pressors including Levophed and vasopressin, also started on amiodarone 8/29 Transfer to [facility] for surgical / cardiac ongoing evaluation 8/29 Echo LVEF 65-70%, otherwise grossly normal.  8/30 extubated  9/1-TRH assumed care  Assessment &  Plan   Principal Problem:   SBO (small bowel obstruction) (HCC) Active Problems:   Hyponatremia   Acute encephalopathy   Type 2 diabetes mellitus ([place])   Acute hypoxemic respiratory failure (HCC)   Shock circulatory ([place])   Cardiac arrest ([place])   Other specified cardiac arrhythmias--- sleep apnea- arrhythmias when he desaturates    Endotracheally intubated   NSVT, bradycardic arrest, concern for LBBB/STEMI -  Status post CODE BLUE cardiac arrest requiring resuscitation. Cardiology following, follow-up recommendations. Initially on amiodarone, has transitioned to metoprolol. Treated with IV heparin for few days, discontinued. Lovenox for VTE prophylaxis Plans for cardiac cath pending POA approval.  Small bowel obstruction -resolved.  Present on admission with CT showing transition point in the right mid abdomen.  General surgery was consulted and followed patient throughout admission.  Obstruction improved with conservative management however patient failed swallow evaluation due to altered mental status.  Core track in place and on tube feeds for nutrition.  Dysphagia -SLP following, appreciate recommendations.  Continue with core track and tube feeds per RD  Leukocytosis - WBC up past couple days 14.1 >> 15.7 today.  Check procal tomorrow with AM labs.  Pt afebrile.  Concern for ongoing aspiration.  No respiratory symptoms reported.  Acute metabolic encephalopathy -MRI brain was obtained on 9/3 due to persistent confusion, it showed no evidence for anoxic brain injury.  Mental status slowly improving.  AKI superimposed on CKD stage II -renal function now near baseline (creatinine approximately 1.2) -- Monitor renal function  Fever -resolved.  Thought due to aspiration during his cardiac arrest event.  He  completed 5 days of Unasyn.  Monitor clinically off antibiotics.  Symptomatic hyponatremia -improved and sodium level improving.  Continue to monitor BMP.  Acute respiratory  failure with hypoxia in the setting of cardiac arrest -resolved.  Required intubation and was extubated on 8/30.  Monitor O2 sat and supplement to maintain sats above 90%, wean as tolerated  Type 2 diabetes -uncontrolled, A1c 8.6%.  Continue sliding scale NovoLog.  Hypertension -continue metoprolol  Hematuria - onset reported by RN on 9/7 due to pt pulling at Foley.  Foley was replaced with 20 Fr on 9/8.  Some blood noted at urethral meatus after replacement likely urethral injury.   --Monitor   Patient BMI: Body mass index is 27.06 kg/m.   DVT prophylaxis: enoxaparin (LOVENOX) injection 40 mg Start: 12/03/20 1600 SCDs Start: 11/27/20 1854 Place TED hose Start: 11/27/20 1854   Diet:  Diet Orders (From admission, onward)     Start     Ordered   12/09/20 1412  DIET - DYS 1 Room service appropriate? Yes; Fluid consistency: Honey Thick  Diet effective now       Question Answer Comment  Room service appropriate? Yes   Fluid consistency: Honey Thick      12/09/20 1411              Code Status: DNR   Subjective 12/10/20    Patient seen awake resting in bed this morning.  He is in good spirits.  Reports he feels well.  Denies having pain or nausea/vomiting or abdominal pain.  Seems tolerating tube feeds well.     Disposition Plan & Communication   Status is: Inpatient  Remains inpatient appropriate because:Inpatient level of care appropriate due to severity of illness.  Gerald discussions ongoing.  Requiring NG tube feeds.  Required SNF placement.  Dispo: The patient is from: Home              Anticipated d/c is to: SNF              Patient currently is not medically stable to d/c.   Difficult to place patient No    Consults, Procedures, Significant Events   Consultants:  PCCM General surgery Cardiology Palliative care  Procedures:  2D echo:  1. Left ventricular ejection fraction, by estimation, is 65 to 70%. The left ventricle has normal function. The left  ventricle has no regional wall motion abnormalities. There is mild left ventricular hypertrophy. Left ventricular diastolic parameters are indeterminate.   2. Right ventricular systolic function is normal. The right ventricular size is normal. Tricuspid regurgitation signal is inadequate for assessing PA pressure.   3. The mitral valve is grossly normal.   4. Aortic valve regurgitation is mild.   5. Aortic dilatation noted. There is mild dilatation of the aortic root, measuring 42 mm.  Antimicrobials:  Anti-infectives (From admission, onward)    Start     Dose/Rate Route Frequency Ordered Stop   12/02/20 1511  ampicillin-sulbactam (UNASYN) 1.5 g in sodium chloride 0.9 % 100 mL IVPB        1.5 g 200 mL/hr over 30 Minutes Intravenous Every 6 hours 12/02/20 1324 12/05/20 0903   12/01/20 1630  ampicillin-sulbactam (UNASYN) 1.5 g in sodium chloride 0.9 % 100 mL IVPB  Status:  Discontinued        1.5 g 200 mL/hr over 30 Minutes Intravenous Every 6 hours 12/01/20 1531 12/01/20 1534   12/01/20 1630  ampicillin-sulbactam (UNASYN) 1.5 g in sodium chloride 0.9 % 100  mL IVPB  Status:  Discontinued        1.5 g 200 mL/hr over 30 Minutes Intravenous Every 8 hours 12/01/20 1534 12/02/20 1324   11/30/20 1000  vancomycin (VANCOREADY) IVPB 750 mg/150 mL  Status:  Discontinued        750 mg 150 mL/hr over 60 Minutes Intravenous Every 12 hours 11/30/20 0043 12/01/20 0937   11/30/20 0130  vancomycin (VANCOREADY) IVPB 1500 mg/300 mL        1,500 mg 150 mL/hr over 120 Minutes Intravenous  Once 11/30/20 0043 11/30/20 0511   11/30/20 0130  ceFEPIme (MAXIPIME) 2 g in sodium chloride 0.9 % 100 mL IVPB  Status:  Discontinued        2 g 200 mL/hr over 30 Minutes Intravenous Every 12 hours 11/30/20 0043 12/01/20 0937         Micro    Objective   Vitals:   12/09/20 1940 12/10/20 0000 12/10/20 0400 12/10/20 0946  BP: 130/65 120/76 136/85 124/69  Pulse:  70 70 68  Resp: (!) 23 (!) $Remo'22 18 20  'GPAYD$ Temp: 98.2 F  (36.8 C) 98.9 F (37.2 C) 98.5 F (36.9 C)   TempSrc: Oral Oral Oral   SpO2: 98% 97% 98%   Weight:   88 kg   Height:        Intake/Output Summary (Last 24 hours) at 12/10/2020 1751 Last data filed at 12/10/2020 1400 Gross per 24 hour  Intake 570 ml  Output 1310 ml  Net -740 ml   Filed Weights   12/08/20 0500 12/09/20 0000 12/10/20 0400  Weight: 84.7 kg 87.5 kg 88 kg    Physical Exam:  General exam: awake, alert, no acute distress, pleasantly confused HEENT: core track in place with continuous tube feeds running, moist mucus membranes, hearing grossly normal  Respiratory system: lungs clear on exam limited by pt speaking, normal respiratory effort, on room air. Cardiovascular system: normal S1/S2, RRR, no pedal edema.   Gastrointestinal system: soft, nontender and nondistended Central nervous system: no gross focal neurologic deficits, normal speech Extremities: moves all, no edema, normal tone Psychiatry: normal mood, congruent affect, abnormal judgment and insight due to confusion  Labs   Data Reviewed: I have personally reviewed following labs and imaging studies  CBC: Recent Labs  Lab 12/06/20 0300 12/09/20 0150 12/10/20 0446  WBC 10.9* 14.1* 15.7*  HGB 12.0* 12.7* 12.1*  HCT 36.2* 38.1* 35.8*  MCV 89.8 89.9 89.3  PLT 364 500* 017*   Basic Metabolic Panel: Recent Labs  Lab 12/05/20 0431 12/05/20 1723 12/05/20 2057 12/06/20 0300 12/07/20 0552 12/09/20 0150 12/10/20 0446  NA  --   --  136 138 133* 131* 134*  K  --   --  4.0 3.7 4.0 4.3 4.2  CL  --   --  105 104 100 100 103  CO2  --   --  $R'24 27 26 24 24  'fg$ GLUCOSE  --   --  234* 112* 208* 262* 240*  BUN  --   --  $R'17 15 14 19 22  'RG$ CREATININE  --   --  1.19 1.17 1.08 1.18 1.10  CALCIUM  --   --  8.2* 8.2* 8.4* 8.3* 8.1*  MG 1.9 2.0  --  2.0 2.0  --  2.0  PHOS 2.6 3.3  --  3.5 2.8  --  2.8   GFR: Estimated Creatinine Clearance: 60.8 mL/min (by C-G formula based on SCr of 1.1 mg/dL). Liver Function  Tests: Recent  Labs  Lab 12/04/20 0259 12/06/20 0300 12/07/20 0552 12/10/20 0446  AST 32 33 34 19  ALT 28 31 35 22  ALKPHOS 44 42 47 50  BILITOT 0.7 0.5 0.6 0.5  PROT 4.9* 5.0* 5.8* 5.7*  ALBUMIN 2.1* 2.1* 2.4* 2.4*   No results for input(s): LIPASE, AMYLASE in the last 168 hours. No results for input(s): AMMONIA in the last 168 hours. Coagulation Profile: No results for input(s): INR, PROTIME in the last 168 hours. Cardiac Enzymes: No results for input(s): CKTOTAL, CKMB, CKMBINDEX, TROPONINI in the last 168 hours. BNP (last 3 results) No results for input(s): PROBNP in the last 8760 hours. HbA1C: No results for input(s): HGBA1C in the last 72 hours. CBG: Recent Labs  Lab 12/10/20 0032 12/10/20 0412 12/10/20 0932 12/10/20 1313 12/10/20 1749  GLUCAP 160* 225* 235* 233* 183*   Lipid Profile: No results for input(s): CHOL, HDL, LDLCALC, TRIG, CHOLHDL, LDLDIRECT in the last 72 hours.  Thyroid Function Tests: No results for input(s): TSH, T4TOTAL, FREET4, T3FREE, THYROIDAB in the last 72 hours. Anemia Panel: No results for input(s): VITAMINB12, FOLATE, FERRITIN, TIBC, IRON, RETICCTPCT in the last 72 hours. Sepsis Labs: No results for input(s): PROCALCITON, LATICACIDVEN in the last 168 hours.  Recent Results (from the past 240 hour(s))  Culture, Respiratory w Gram Stain     Status: None   Collection Time: 12/01/20  8:30 AM   Specimen: Tracheal Aspirate  Result Value Ref Range Status   Specimen Description TRACHEAL ASPIRATE  Final   Special Requests NONE  Final   Gram Stain   Final    ABUNDANT WBC PRESENT, PREDOMINANTLY PMN ABUNDANT GRAM NEGATIVE RODS FEW GRAM POSITIVE COCCI IN CHAINS    Culture   Final    RARE Normal respiratory flora-no Staph aureus or Pseudomonas seen Performed at [facility], [address], [place], [place] [address]    Report Status 12/03/2020 FINAL  Final      Imaging Studies   DG Swallowing Func-Speech Pathology  Result Date:  12/09/2020 Table formatting from the original result was not included. Objective Swallowing Evaluation: Type of Study: MBS-Modified Barium Swallow Study  Patient Details Name: DARROW BARREIRO MRN: [id number] Date of Birth: 11-23-1945 Today's Date: 12/09/2020 Time: SLP Start Time (ACUTE ONLY): 1300 -SLP Stop Time (ACUTE ONLY): 1325 SLP Time Calculation (min) (ACUTE ONLY): 25 min Past Medical History: Past Medical History: Diagnosis Date  Arthritis   BPH (benign prostatic hyperplasia)   CKD (chronic kidney disease), stage II   Complicated UTI (urinary tract infection) 03/2014  Depression   GERD (gastroesophageal reflux disease)   History of pneumonia   History of stroke   HOH (hard of hearing)   Hypertension   Hyponatremia   Lower GI bleed 2017  a. ? due to polyp.  NSVT (nonsustained ventricular tachycardia) (HCC)   Rheumatic fever   Childhood  Sleep apnea   Does not use CPAP  Type 2 diabetes mellitus ([place])  Past Surgical History: Past Surgical History: Procedure Laterality Date  CARPAL TUNNEL RELEASE Left 2010  CARPAL TUNNEL RELEASE Right 07/13/2017  Procedure: CARPAL TUNNEL RELEASE;  Surgeon: Carole Civil, MD;  Location: [facility];  Service: Orthopedics;  Laterality: Right;  CERVICAL SPINE SURGERY  2010  COLONOSCOPY WITH PROPOFOL N/A 11/30/2015  Procedure: COLONOSCOPY WITH PROPOFOL;  Surgeon: Daneil Dolin, MD;  Location: [facility];  Service: Endoscopy;  Laterality: N/A;  KNEE ARTHROSCOPY  [place]  Nose surgery - broken  nose    POLYPECTOMY  11/30/2015  Procedure: POLYPECTOMY;  Surgeon: Daneil Dolin, MD;  Location: [facility];  Service: Endoscopy;;  polyp at ascending colon, rectal polyp  TRANSURETHRAL INCISION OF PROSTATE N/A 01/20/2015  Procedure: TRANSURETHRAL INCISION OF THE PROSTATE (TUIP);  Surgeon: Irine Seal, MD;  Location: [facility];  Service: Urology;  Laterality: N/A; HPI: 75 y.o. male presented to [facility] ED with diarrhea, decreased p.o. intake and nausea. Pt admitted with acute  encephalopathy and symptomatic hyponatremia. Abdominal distension 8/27. CT abd/pelvis (8/28) (+) SBO and NGT placed. 8/28 pt went into cardiac arrest, concern for possible STEMI. Transferred to [facility] for further treatment. CT head (8/28) revealed no acute intracranial abnormality. Chest xray (8/30) noted "appearance of airspace disease in the right middle lobe. Small left pleural effusion". Hosptial admission complicated by continued impulsivity and agitation with pt pulling out multiple NGT's despite restraints.  ETT: 8/28-8/30. PMHx significant for CKD, BPH, DMII, Hx of CVA, HTN, and GERD. SLE (08/23/2017) revealed cognitive communication deficits post CVA.  Subjective: Pt seen in radiology for instrumental assessment of swallow function and safety. No family present. Assessment / Plan / Recommendation CHL IP CLINICAL IMPRESSIONS 12/09/2020 Clinical Impression Pt presents with a moderate-severe oral and pharyngeal dysphagia, characterized as follows: Orally, pt demonstrates poor bolus formation and control, with premature spillage over the tongue base across consistencies. Pt was noted to piecemeal several boluses, swallowing small amounts of the bolus at one time. Oral residue was also noted, which then spilled posteriorly into the vallecular sinus. Pharyngeal swallow is characterized by delayed swallow reflex, with trigger occurring at the level of the pyriform sinuses on nectar and honey thick liquids (cup sip), and at the vallecular sinus on honey (tsp) and puree. Epiglottic inversion was reduced, in large part by the presence of the Cortrak feeding tube. This resulted in vallecular residue across consistencies. This increases aspiration risk, as residue thins with saliva, or is added to by subsequent boluses and can potentially spill over the vallecular sinus and into the open airway. SILENT ASPIRATION was seen on nectar thick liquids via teaspoon and cup sip. Flash penetration was noted during the swallow of  honey thick liquids and puree. Pt was noted to be impulsive with cup sips, and has difficulty following commands due to confusion and hearing loss. Pt is at significantly high risk of aspiration of any consistency given. Puree and honey thick liquids may be presented in small bites/sips with 1:1 close supervision. Meds should be crushed in puree or given via Cortrak.  SLP Visit Diagnosis Dysphagia, oropharyngeal phase (R13.12)     Impact on safety and function Severe aspiration risk   CHL IP TREATMENT RECOMMENDATION 12/09/2020 Treatment Recommendations Therapy as outlined in treatment plan below   Prognosis 12/09/2020 Prognosis for Safe Diet Advancement Fair Barriers to Reach Goals Cognitive deficits   CHL IP DIET RECOMMENDATION 12/09/2020 SLP Diet Recommendations Dysphagia 1 (Puree) solids;Honey thick liquids Liquid Administration via Cup;No straw;Spoon Medication Administration Crushed with puree Compensations Minimize environmental distractions;Slow rate;Small sips/bites Postural Changes Seated upright at 90 degrees;Remain semi-upright after after feeds/meals (Comment)   CHL IP OTHER RECOMMENDATIONS 12/09/2020   Oral Care Recommendations Oral care QID Other Recommendations Order thickener from pharmacy   CHL IP FOLLOW UP RECOMMENDATIONS 12/09/2020 Follow up Recommendations TBD   CHL IP FREQUENCY AND DURATION 12/09/2020 Speech Therapy Frequency (ACUTE ONLY) min 2x/week Treatment Duration 2 weeks      CHL IP ORAL PHASE 12/09/2020 Oral Phase Impaired  Oral - Honey Teaspoon  Premature spillage;Other (Comment) Oral - Honey Cup Premature spillage;Other (Comment);Piecemeal swallowing Oral - Nectar Teaspoon Other (Comment);Premature spillage Oral - Nectar Cup Premature spillage;Other (Comment);Piecemeal swallowing Oral - Puree Piecemeal swallowing;Premature spillage   CHL IP PHARYNGEAL PHASE 12/09/2020 Pharyngeal Phase Impaired Pharyngeal- Honey Teaspoon Delayed swallow initiation-vallecula;Pharyngeal residue -  valleculae;Penetration/Aspiration during swallow Pharyngeal Material enters airway, remains ABOVE vocal cords then ejected out Pharyngeal- Honey Cup Delayed swallow initiation-pyriform sinuses;Pharyngeal residue - valleculae;Penetration/Aspiration during swallow Pharyngeal Material enters airway, remains ABOVE vocal cords then ejected out Pharyngeal- Nectar Teaspoon Delayed swallow initiation-pyriform sinuses;Pharyngeal residue - valleculae;Penetration/Aspiration during swallow Pharyngeal Material enters airway, passes BELOW cords without attempt by patient to eject out (silent aspiration) Pharyngeal- Nectar Cup Delayed swallow initiation-pyriform sinuses;Pharyngeal residue - valleculae;Penetration/Aspiration during swallow Pharyngeal Material enters airway, passes BELOW cords without attempt by patient to eject out (silent aspiration) Pharyngeal- Puree Delayed swallow initiation-vallecula;Pharyngeal residue - valleculae;Penetration/Aspiration during swallow Pharyngeal Material enters airway, remains ABOVE vocal cords and not ejected out  CHL IP CERVICAL ESOPHAGEAL PHASE 12/09/2020 Cervical Esophageal Phase [facility] Celia [address]. [place], [place], [place] Speech Language Pathologist Office: [phone] Shonna Chock 12/09/2020, 2:00 PM                Medications   Scheduled Meds:  amLODipine  10 mg Oral Daily   bisacodyl  10 mg Rectal QHS   chlorhexidine gluconate (MEDLINE KIT)  15 mL Mouth Rinse BID   Chlorhexidine Gluconate Cloth  6 each Topical Daily   enoxaparin (LOVENOX) injection  40 mg Subcutaneous Q24H   feeding supplement (PROSource TF)  45 mL Per Tube BID   insulin aspart  0-15 Units Subcutaneous Q4H   insulin aspart  3 Units Subcutaneous Q4H   mouth rinse  15 mL Mouth Rinse q12n4p   metoprolol tartrate  25 mg Per Tube BID   sodium chloride flush  3 mL Intravenous Q12H   sodium chloride flush  3 mL Intravenous Q12H   Continuous Infusions:  sodium chloride Stopped (12/04/20 1802)   sodium chloride  Stopped (11/30/20 0009)   feeding supplement (OSMOLITE 1.5 CAL) 1,000 mL (12/10/20 1703)       LOS: 12 days    Time spent: 30 minutes     Ezekiel Slocumb, DO [facility]  12/10/2020, 5:51 PM      If 7PM-7AM, please contact night-coverage. How to contact the [facility] Attending or Consulting provider [place] or covering provider during after hours [place], for this patient?    Check the care team in [facility] and look for a) attending/consulting TR[facility] provider listed and b) the [facility] team listed Log into [email or web address] and use [place]'s universal password to access. If you do not have the password, please contact the hospital operator. Locate the [facility] provider you are looking for under [facility] and page to a number that you can be directly reached. If you still have difficulty reaching the provider, please page the [facility] (Director on Call) for the Hospitalists listed on amion for assistance.

## 2020-12-10 NOTE — Progress Notes (Signed)
    Chart reviewed. Patient assessed at the bedside. No family present.  Patient remains confused. No acute distress noted. Alert to self only.   I spoke at length with patient's aunt, Albert Garrison (as requested by brother, Albert Garrison). Updates provided. Family is aware patient will not be able to return home in current state. Family is arranging for temporary hold on living arrangements.   They are remaining hopeful for some improvement with realistic understanding patient most likely will not return to baseline and with possible underlying dementia that has surfaced during this admission.   Education provided on dementia in elderly and expectations. Family verbalized understanding.   All questions answered and support provided.   Plan -Continue to treat the treatable. Family is remaining hopeful for improvement. If no improvement or further decline, would then consider focusing on his comfort. Goal is for SNF rehab.  -PMT will continue to support and follow as needed. Please call with urgent needs.   Time Total: 40 min.   Visit consisted of counseling and education dealing with the complex and emotionally intense issues of symptom management and palliative care in the setting of serious and potentially life-threatening illness.Greater than 50%  of this time was spent counseling and coordinating care related to the above assessment and plan.  Alda Lea, AGPCNP-BC  Palliative Medicine Team [phone]

## 2020-12-10 NOTE — Progress Notes (Signed)
Progress Note  Patient Name: Albert Garrison Date of Encounter: 12/10/2020  [facility] Cardiologist: Carlyle Dolly, MD   Subjective   No CP or dyspnea  Inpatient Medications    Scheduled Meds:  amLODipine  10 mg Oral Daily   bisacodyl  10 mg Rectal QHS   chlorhexidine gluconate (MEDLINE KIT)  15 mL Mouth Rinse BID   Chlorhexidine Gluconate Cloth  6 each Topical Daily   enoxaparin (LOVENOX) injection  40 mg Subcutaneous Q24H   feeding supplement (PROSource TF)  45 mL Per Tube BID   insulin aspart  0-15 Units Subcutaneous Q4H   insulin aspart  3 Units Subcutaneous Q4H   mouth rinse  15 mL Mouth Rinse q12n4p   metoprolol tartrate  25 mg Per Tube BID   sodium chloride flush  3 mL Intravenous Q12H   sodium chloride flush  3 mL Intravenous Q12H   Continuous Infusions:  sodium chloride Stopped (12/04/20 1802)   sodium chloride Stopped (11/30/20 0009)   feeding supplement (OSMOLITE 1.5 CAL) 1,000 mL (12/10/20 0047)   PRN Meds: sodium chloride, acetaminophen **OR** acetaminophen, bisacodyl, guaiFENesin-dextromethorphan, LORazepam, menthol-cetylpyridinium, ondansetron **OR** ondansetron (ZOFRAN) IV, sodium chloride flush   Vital Signs    Vitals:   12/09/20 1940 12/10/20 0000 12/10/20 0400 12/10/20 0946  BP: 130/65 120/76 136/85 124/69  Pulse:  70 70 68  Resp: (!) 23 (!) $Remo'22 18 20  'Kfnba$ Temp: 98.2 F (36.8 C) 98.9 F (37.2 C) 98.5 F (36.9 C)   TempSrc: Oral Oral Oral   SpO2: 98% 97% 98%   Weight:   88 kg   Height:        Intake/Output Summary (Last 24 hours) at 12/10/2020 1030 Last data filed at 12/10/2020 0946 Gross per 24 hour  Intake 90 ml  Output 1650 ml  Net -1560 ml    Last 3 Weights 12/10/2020 12/09/2020 12/08/2020  Weight (lbs) 194 lb 0.1 oz 192 lb 14.4 oz 186 lb 11.7 oz  Weight (kg) 88 kg 87.5 kg 84.7 kg      Telemetry    Sinus with PVCs - Personally Reviewed   Physical Exam   GEN: NAD Neck: Supple Cardiac: RRR, no rub Respiratory: CTA GI: Soft,  ND MS: No edema Neuro: A and O x1 Psych: Normal affect   Labs    High Sensitivity Troponin:   Recent Labs  Lab 11/29/20 2243 11/30/20 0224 11/30/20 0503 11/30/20 0730  TROPONINIHS 116* 263* 306* 285*       Chemistry Recent Labs  Lab 12/06/20 0300 12/07/20 0552 12/09/20 0150 12/10/20 0446  NA 138 133* 131* 134*  K 3.7 4.0 4.3 4.2  CL 104 100 100 103  CO2 $Re'27 26 24 24  'Uyt$ GLUCOSE 112* 208* 262* 240*  BUN $Re'15 14 19 22  'PNP$ CREATININE 1.17 1.08 1.18 1.10  CALCIUM 8.2* 8.4* 8.3* 8.1*  PROT 5.0* 5.8*  --  5.7*  ALBUMIN 2.1* 2.4*  --  2.4*  AST 33 34  --  19  ALT 31 35  --  22  ALKPHOS 42 47  --  50  BILITOT 0.5 0.6  --  0.5  GFRNONAA >60 >60 >60 >60  ANIONGAP $RemoveB'7 7 7 7      'xkSVawMM$ Hematology Recent Labs  Lab 12/06/20 0300 12/09/20 0150 12/10/20 0446  WBC 10.9* 14.1* 15.7*  RBC 4.03* 4.24 4.01*  HGB 12.0* 12.7* 12.1*  HCT 36.2* 38.1* 35.8*  MCV 89.8 89.9 89.3  MCH 29.8 30.0 30.2  MCHC 33.1 33.3 33.8  RDW 14.2 13.5 13.7  PLT 364 500* 460*     Patient Profile     75 y.o. male with history of CVA 2019, PVCs, NSVT, DM2, hyponatremia, hypertension, CKD stage II, KPH, sleep apnea (not on CPAP). Previously seen by cardiology - a) hx elevated troponin in setting of stroke (echo with normal LVEF at that time) and PVCs, brief NSVT previously seen on telemetry, managed conservatively. Presented to the hospital with SBO, hyponatremia (Na 118 on 8/26) and acute encephalopathy. During admission he developed NSVT then more sustained runs, progressive IVCD by EKG, then reportedly  bradycardia and unresponsiveness. He had compressions for under a minute with ROSC, no shocks delivered. He was intubated for hypoxic respiratory failure and required pressor support. He was transferred to [facility] for further management. Hospital course also complicated by dysphagia (requiring CorTrak), persistent confusion, AKI on CKD stage II, fever (felt possibly due to aspiration during arrest s/p Unasyn). Troponin  elevated to 306.   Assessment & Plan    1 history of cardiac arrest with nonsustained ventricular tachycardia followed by bradycardia-echocardiogram shows ejection fraction 65 to 70%, mild left ventricular hypertrophy, mild aortic insufficiency, mildly dilated aortic root.  Plan previously had been cardiac catheterization to exclude coronary disease.  However patient remains confused and alert and oriented x1.  He pulled his Foley catheter out last evening.  I remain concerned about his ability to cooperate with cardiac catheterization.  We will treat medically for now.    2 confusion-there appears to be some baseline dementia which was exacerbated by previous events at [facility].  His improvement appears to have leveled off.  If he does not continue to improve it is not clear to me that cardiac catheterization would be beneficial.  3 nonsustained ventricular tachycardia-continue beta-blocker.  4 recent small bowel obstruction-management per primary service.  For questions or updates, please contact [place] Please consult [email or web address] for contact info under        Signed, Kirk Ruths, MD  12/10/2020, 10:30 AM

## 2020-12-10 NOTE — Progress Notes (Addendum)
Pt found to be pulling on foley catheter during the dayshift. Pt had not put out any urine from catheter since. Bladder scan showed 436m and 4836m Bloody drainage coming from the tip of the penis, flushing the catheter did not produce any urine return. MD notified with new orders for a 20Fr catheter.

## 2020-12-10 NOTE — Care Management Important Message (Signed)
Important Message  Patient Details  Name: Albert Garrison MRN: [id number] Date of Birth: Aug 29, 1945   Medicare Important Message Given:  Yes     [name] 12/10/2020, 8:44 AM

## 2020-12-10 NOTE — TOC Progression Note (Signed)
Transition of Care [facility]) - Progression Note    Patient Details  Name: Albert Garrison MRN: [id number] Date of Birth: [birth year]  [facility] of [facility] CM/SW [place], [place] Phone Number: 12/10/2020, 3:42 PM  Clinical Narrative:    CSW contacted pt brother who shared that he would like his brother to go to [facility] center bc it is close to many family members in that area. CSW faxed pt out and will follow up with bed availability.    Expected Discharge Plan: [place] Barriers to Discharge: Continued Medical Work up  Expected Discharge Plan and Services Expected Discharge Plan: [place] arrangements for the past 2 months: [place]                                       Social Determinants of Health (SDOH) Interventions    Readmission Risk Interventions No flowsheet data found.

## 2020-12-11 DIAGNOSIS — G9340 Encephalopathy, unspecified: Secondary | ICD-10-CM | POA: Diagnosis not present

## 2020-12-11 DIAGNOSIS — J9601 Acute respiratory failure with hypoxia: Secondary | ICD-10-CM | POA: Diagnosis not present

## 2020-12-11 DIAGNOSIS — K56609 Unspecified intestinal obstruction, unspecified as to partial versus complete obstruction: Secondary | ICD-10-CM | POA: Diagnosis not present

## 2020-12-11 DIAGNOSIS — E871 Hypo-osmolality and hyponatremia: Secondary | ICD-10-CM | POA: Diagnosis not present

## 2020-12-11 DIAGNOSIS — I469 Cardiac arrest, cause unspecified: Secondary | ICD-10-CM | POA: Diagnosis not present

## 2020-12-11 LAB — GLUCOSE, CAPILLARY
Glucose-Capillary: 172 mg/dL — ABNORMAL HIGH (ref 70–99)
Glucose-Capillary: 184 mg/dL — ABNORMAL HIGH (ref 70–99)
Glucose-Capillary: 219 mg/dL — ABNORMAL HIGH (ref 70–99)
Glucose-Capillary: 285 mg/dL — ABNORMAL HIGH (ref 70–99)
Glucose-Capillary: 307 mg/dL — ABNORMAL HIGH (ref 70–99)

## 2020-12-11 LAB — CBC
HCT: 34.7 % — ABNORMAL LOW (ref 39.0–52.0)
Hemoglobin: 11.4 g/dL — ABNORMAL LOW (ref 13.0–17.0)
MCH: 30 pg (ref 26.0–34.0)
MCHC: 32.9 g/dL (ref 30.0–36.0)
MCV: 91.3 fL (ref 80.0–100.0)
Platelets: 485 10*3/uL — ABNORMAL HIGH (ref 150–400)
RBC: 3.8 MIL/uL — ABNORMAL LOW (ref 4.22–5.81)
RDW: 13.8 % (ref 11.5–15.5)
WBC: 11.7 10*3/uL — ABNORMAL HIGH (ref 4.0–10.5)
nRBC: 0 % (ref 0.0–0.2)

## 2020-12-11 NOTE — Progress Notes (Signed)
Physical Therapy Treatment Patient Details Name: Albert Garrison MRN: [id number] DOB: [birth year] Today's Date: 12/11/2020    History of Present Illness 75 y.o. male presenting to [facility] ED 8/29 with diarrhea, decreased p.o. intake and nausea. Transferred to [facility]. Patient admitted with acute encephalopathy and symptomatic hyponatremia. Abdominal distension 8/27. CT abd (+) SBO and NGT placed. Hosptial admission complicated by continued impulsivity and agitation with patient pulling out multiple NGT's despite restraints. 8/28 wide QRS with frequent consecutive PVCs and LBBB. Bradycardic and hypotensive followed by loss of pulse. Code blue called. ROSC achieved after several mins of CPR. Troponin peaked at 309. Concern for possible STEMI with cardiac cath scheduled 12/08/20.  PRVC 8/28-8/30. Cardiology following. PMHx significant for CKD, BPH, DMII, Hx of CVA, HTN, and GERD.    PT Comments    Pt continues to be disoriented to time, situation, and location. He was unable to identify his birth year also today. He needs repeated simple multi-modal cues to sequence all tasks and direct him with mobility. Performed multiple sit to stand transfers from recliner, cuing pt on improving technique for increased independence. Ambulated up to ~50 ft with a RW and min guard-minA before fatiguing this date. He continues to display a slow, shuffling gait pattern and is at high risk for falls. Will continue to follow acutely. Updated d/c recs to SNF as pt was denied by CIR due to lack of necessary caregiver support at d/c.    Follow Up Recommendations  Supervision/Assistance - 24 hour;SNF (Patient lacks caregiver support to admit to CIR)     Equipment Recommendations  Rolling walker with 5" wheels    Recommendations for Other Services       Precautions / Restrictions Precautions Precautions: Fall Restrictions Weight Bearing Restrictions: No    Mobility  Bed Mobility Overal bed mobility: Needs Assistance Bed  Mobility: Sit to Supine       Sit to supine: Min guard;HOB elevated   General bed mobility comments: min guard for safety, vc for sequencing    Transfers Overall transfer level: Needs assistance Equipment used: Rolling walker (2 wheeled) Transfers: Sit to/from Stand Sit to Stand: Min assist         General transfer comment: Sit to stand from recliner x5 reps in a row with cues for hand placement and gaining momentum, minA.  Ambulation/Gait Ambulation/Gait assistance: Min assist;Min guard Gait Distance (Feet): 50 Feet Assistive device: Rolling walker (2 wheeled) Gait Pattern/deviations: Step-through pattern;Decreased step length - right;Decreased step length - left;Shuffle;Decreased stride length Gait velocity: decreased Gait velocity interpretation: <1.31 ft/sec, indicative of household ambulator General Gait Details: Pt with slow, shuffling gait with pt not following cues to increase step length or foot clearance. Min guard assist majority of time, but intermittent minA for steadying. Cues throughout for directing pt.   Stairs             Wheelchair Mobility    Modified Rankin (Stroke Patients Only) Modified Rankin (Stroke Patients Only) Pre-Morbid Rankin Score: No symptoms Modified Rankin: Moderately severe disability     Balance Overall balance assessment: Needs assistance Sitting-balance support: Bilateral upper extremity supported Sitting balance-Leahy Scale: Poor     Standing balance support: Bilateral upper extremity supported Standing balance-Leahy Scale: Poor Standing balance comment: Reliant on UE support.                            Cognition Arousal/Alertness: Awake/alert Behavior During Therapy: Impulsive Overall Cognitive Status: Impaired/Different from  baseline Area of Impairment: Orientation;Attention;Following commands;Safety/judgement;Awareness;Problem solving;Memory                 Orientation Level: Disoriented  to;Place;Time;Situation;Person Current Attention Level: Focused Memory: Decreased short-term memory;Decreased recall of precautions Following Commands: Follows one step commands inconsistently Safety/Judgement: Decreased awareness of deficits;Decreased awareness of safety Awareness: Intellectual Problem Solving: Slow processing;Decreased initiation;Difficulty sequencing;Requires verbal cues;Requires tactile cues General Comments: Pt very confused but awake and alert and follows commands with delay. Needs repeated commands at times. Pt unable to identify current year/date or situation. When provided list to choose from to identify his current location, he said he was at a school. Pt able to tell me his name and that he was born [birth year] but stated he was born in the year "[birth year]". Poor awareness into deficits and safety, needing directing throughout session.      Exercises      General Comments        Pertinent Vitals/Pain Pain Assessment: Faces Faces Pain Scale: No hurt Pain Intervention(s): Monitored during session    Home Living                      Prior Function            PT Goals (current goals can now be found in the care plan section) Acute Rehab PT Goals Patient Stated Goal: to sleep PT Goal Formulation: With patient Time For Goal Achievement: 12/16/20 Potential to Achieve Goals: Good Progress towards PT goals: Progressing toward goals    Frequency    Min 2X/week      PT Plan Discharge plan needs to be updated;Frequency needs to be updated    Co-evaluation              AM-PAC PT "6 Clicks" Mobility   Outcome Measure  Help needed turning from your back to your side while in a flat bed without using bedrails?: None Help needed moving from lying on your back to sitting on the side of a flat bed without using bedrails?: A Little Help needed moving to and from a bed to a chair (including a wheelchair)?: A Little Help needed standing up from a chair  using your arms (e.g., wheelchair or bedside chair)?: A Little Help needed to walk in hospital room?: A Little Help needed climbing 3-5 steps with a railing? : A Lot 6 Click Score: 18    End of Session Equipment Utilized During Treatment: Gait belt Activity Tolerance: Patient tolerated treatment well Patient left: in bed;with call bell/phone within reach;with bed alarm set Nurse Communication: Mobility status PT Visit Diagnosis: Unsteadiness on feet (R26.81);Other abnormalities of gait and mobility (R26.89);Muscle weakness (generalized) (M62.81);Difficulty in walking, not elsewhere classified (R26.2)     Time: [id number] PT Time Calculation (min) (ACUTE ONLY): 14 min  Charges:  $Therapeutic Activity: 8-22 mins                     Moishe Spice, PT, DPT Acute Rehabilitation Services  Pager: [phone] Office: [place] 12/11/2020, 2:52 PM

## 2020-12-11 NOTE — Progress Notes (Signed)
     Chart Reviewed. Patient examined at the bedside. No family present. He is awake and alert. Able to answer some orientation questions correctly (name, dob, location-knew he was in the hospital but not the exact name or city). Alert to family and able to speak about his brother and aunt by name. Mentation seems somewhat improving. Follows simple commands with some redirection. Coretrak remains in place, appetite poor with only a few bites and sips.   Family updated. Goals remain clear, continue to treat the treatable with watchful waiting.   Education provided on nutrition, risk of aspiration, and concerns if patient's appetite does not improve over time.   All questions answered and support provided.   Plan -Continue with current plan of care. Family is remaining hopeful with watchful waiting. Goal currently for SNF rehab if appropriate.  -PMT will continue to support and follow as needed. Please call for urgent needs.   Time Total: 35 min.   Visit consisted of counseling and education dealing with the complex and emotionally intense issues of symptom management and palliative care in the setting of serious and potentially life-threatening illness.Greater than 50%  of this time was spent counseling and coordinating care related to the above assessment and plan.  Alda Lea, AGPCNP-BC  Palliative Medicine Team [phone]

## 2020-12-11 NOTE — Progress Notes (Addendum)
  Speech Language Pathology Treatment: Dysphagia  Patient Details Name: ANIRUDDHA ROTHMEYER MRN: [id number] DOB: [birth year] Today's Date: 12/11/2020 Time: [id number] SLP Time Calculation (min) (ACUTE ONLY): 13 min  Assessment / Plan / Recommendation Clinical Impression  Pt was seen for dysphagia therapy after 9/7 MBS.  Cortrak still in place; pt asked if I could "cut it out," and related reasons for hospitalization to his nose and the tube.  Breakfast tray at bedside with most of items still on plate.  He accepted three bites of pureed fruit and drank half of a container of honey thick juice.  He coughed intermittently throughout session despite cues for precautions and closes 1:1 assistance. Breath sounds have been diminished but consistent for last three days.  He remains at risk for aspiration upon review of MBS and performance today - current oral diet poses the least risk.  Hopefully intake and overall swallowing function will improve as mentation improves.  SLP will continue to follow for dysphagia. D/W RN.   HPI HPI: 75 y.o. male presented to [facility] ED with diarrhea, decreased p.o. intake and nausea. Pt admitted with acute encephalopathy and symptomatic hyponatremia. Abdominal distension 8/27. CT abd/pelvis (8/28) (+) SBO and NGT placed. 8/28 pt went into cardiac arrest, concern for possible STEMI. Transferred to [facility] for further treatment. CT head (8/28) revealed no acute intracranial abnormality. Chest xray (8/30) noted "appearance of airspace disease in the right middle lobe. Small left pleural effusion". Hosptial admission complicated by continued impulsivity and agitation with pt pulling out multiple NGT's despite restraints.  ETT: 8/28-8/30. PMHx significant for CKD, BPH, DMII, Hx of CVA, HTN, and GERD. SLE (08/23/2017) revealed cognitive communication deficits post CVA.      SLP Plan  Continue with current plan of care       Recommendations  Diet recommendations: Dysphagia 1  (puree);Honey-thick liquid Liquids provided via: Cup Medication Administration: Via alternative means Supervision: Full supervision/cueing for compensatory strategies Compensations: Minimize environmental distractions;Slow rate;Small sips/bites Postural Changes and/or Swallow Maneuvers: Seated upright 90 degrees                SLP Visit Diagnosis: Dysphagia, oropharyngeal phase (R13.12) Plan: Continue with current plan of care       GO             Sherol Sabas L. Tivis Ringer, [place] Office number [phone] Pager [phone]    Juan Quam Laurice 12/11/2020, 10:11 AM

## 2020-12-11 NOTE — Progress Notes (Signed)
PROGRESS NOTE    Albert Garrison   DUK:[id number]  DOB: 1946/01/02  PCP: Celene Squibb, MD    DOA: 11/27/2020 LOS: 81    Brief Narrative / Hospital Course to Date:   75 year old male with history of hypertension, hyperlipidemia, former smoker, DM2, history of lower GI bleed, chronic kidney disease stage II who initially presented to [facility] with complaints of nausea, poor p.o. intake and decreased appetite.  Admitted on 8/26.  Patient was hyponatremic with sodium 118 with AKI and leukocytosis.  Abdominal x-ray on hospital day 2 showed a small bowel obstruction and general surgery was consulted.  CT scan abdomen pelvis showed a transition point in the right mid abdomen.    Hospital course complicated by increased ectopy on telemetry and ultimately patient had a cardiac arrest late evening of 8/28.  He became unresponsible and seen to have agonal respirations.  CODE BLUE was called and ROSC was achieved in about 5 minutes.  Due to concern for new onset left bundle branch block/STEMI, patient was started on heparin and amiodarone.  Cardiology was consulted and patient was transferred to [facility].  Significant events 8/26 Admit to [facility] 8/26 N/V 8/27 KUB with concern for distal SBO. ECHO with LVEF 55-60%, no RWMA, grade I diastolic dysfunction 7/54 CT ABD with small bowel obstruction with likely transition point in the right mid abdomen.  CT Head negative.  8/28 cardiac arrest >>NSVT with somewhat more sustained runs, progressive IVCD by ECG, reportedly bradycardia and unresponsiveness.  He was treated with atropine and epinephrine, had compressions under a minute with ROSC, no shocks delivered.  He was intubated and did require initiation of pressors including Levophed and vasopressin, also started on amiodarone 8/29 Transfer to [facility] for surgical / cardiac ongoing evaluation 8/29 Echo LVEF 65-70%, otherwise grossly normal.  8/30 extubated  9/1-TRH assumed care  Assessment &  Plan   Principal Problem:   SBO (small bowel obstruction) (HCC) Active Problems:   Hyponatremia   Acute encephalopathy   Type 2 diabetes mellitus ([place])   Acute hypoxemic respiratory failure (HCC)   Shock circulatory ([place])   Cardiac arrest ([place])   Other specified cardiac arrhythmias--- sleep apnea- arrhythmias when he desaturates    Endotracheally intubated   NSVT, bradycardic arrest, concern for LBBB/STEMI -  Status post CODE BLUE cardiac arrest requiring resuscitation. Cardiology following, follow-up recommendations. Initially on amiodarone, has transitioned to metoprolol. Treated with IV heparin for few days, discontinued. Lovenox for VTE prophylaxis Plans for cardiac cath pending POA approval.  Small bowel obstruction -resolved.  Present on admission with CT showing transition point in the right mid abdomen.  General surgery was consulted and followed patient throughout admission.  Obstruction improved with conservative management however patient failed swallow evaluation due to altered mental status.  Core track in place and on tube feeds for nutrition.  Dysphagia -SLP following, appreciate recommendations.  Continue with core track and tube feeds per RD  Leukocytosis - WBC up on 9/7-8 at 14.1 >> 15.7, but improved to 11.7 today.  Pt afebrile.  Concern for ongoing aspiration.  No respiratory symptoms reported.  Acute metabolic encephalopathy -MRI brain was obtained on 9/3 due to persistent confusion, it showed no evidence for anoxic brain injury.  Mental status slowly improving but pt remains confused.  AKI superimposed on CKD stage II -renal function now near baseline (creatinine approximately 1.2) -- Monitor renal function  Fever -resolved.  Thought due to aspiration during his cardiac arrest event.  He completed 5 days of Unasyn.  Monitor clinically off antibiotics.  Symptomatic hyponatremia -improved and sodium level improving.  Continue to monitor BMP.  Acute  respiratory failure with hypoxia in the setting of cardiac arrest -resolved.  Required intubation and was extubated on 8/30.  Monitor O2 sat and supplement to maintain sats above 90%, wean as tolerated  Type 2 diabetes -uncontrolled, A1c 8.6%.  Continue sliding scale NovoLog.  Hypertension -continue metoprolol  Hematuria - onset reported by RN on 9/7 due to pt pulling at Foley.  Foley was replaced with 20 Fr on 9/8.  Some blood noted at urethral meatus after replacement likely urethral injury.   --Monitor   Patient BMI: Body mass index is 27.03 kg/m.   DVT prophylaxis: enoxaparin (LOVENOX) injection 40 mg Start: 12/03/20 1600 SCDs Start: 11/27/20 1854 Place TED hose Start: 11/27/20 1854   Diet:  Diet Orders (From admission, onward)     Start     Ordered   12/09/20 1412  DIET - DYS 1 Room service appropriate? Yes; Fluid consistency: Honey Thick  Diet effective now       Question Answer Comment  Room service appropriate? Yes   Fluid consistency: Honey Thick      12/09/20 1411              Code Status: DNR   Subjective 12/11/20    Patient seen awake resting in bed this morning.  Reports he doesn't feel well today.  Says he feels congested.  Denies fever/chills, SOB or CP.  Denies stomach trouble.  Noted to have a wet sounding cough twice during encounter.    Disposition Plan & Communication   Status is: Inpatient  Remains inpatient appropriate because:Inpatient level of care appropriate due to severity of illness.  [place] discussions ongoing.  Requiring NG tube feeds.  Required SNF placement.  Dispo: The patient is from: Home              Anticipated d/c is to: SNF              Patient currently is not medically stable to d/c.   Difficult to place patient No    Consults, Procedures, Significant Events   Consultants:  PCCM General surgery Cardiology Palliative care  Procedures:  2D echo:  1. Left ventricular ejection fraction, by estimation, is 65 to 70%. The  left ventricle has normal function. The left ventricle has no regional wall motion abnormalities. There is mild left ventricular hypertrophy. Left ventricular diastolic parameters are indeterminate.   2. Right ventricular systolic function is normal. The right ventricular size is normal. Tricuspid regurgitation signal is inadequate for assessing PA pressure.   3. The mitral valve is grossly normal.   4. Aortic valve regurgitation is mild.   5. Aortic dilatation noted. There is mild dilatation of the aortic root, measuring 42 mm.  Antimicrobials:  Anti-infectives (From admission, onward)    Start     Dose/Rate Route Frequency Ordered Stop   12/02/20 1511  ampicillin-sulbactam (UNASYN) 1.5 g in sodium chloride 0.9 % 100 mL IVPB        1.5 g 200 mL/hr over 30 Minutes Intravenous Every 6 hours 12/02/20 1324 12/05/20 0903   12/01/20 1630  ampicillin-sulbactam (UNASYN) 1.5 g in sodium chloride 0.9 % 100 mL IVPB  Status:  Discontinued        1.5 g 200 mL/hr over 30 Minutes Intravenous Every 6 hours 12/01/20 1531 12/01/20 1534   12/01/20 1630  ampicillin-sulbactam (UNASYN) 1.5  g in sodium chloride 0.9 % 100 mL IVPB  Status:  Discontinued        1.5 g 200 mL/hr over 30 Minutes Intravenous Every 8 hours 12/01/20 1534 12/02/20 1324   11/30/20 1000  vancomycin (VANCOREADY) IVPB 750 mg/150 mL  Status:  Discontinued        750 mg 150 mL/hr over 60 Minutes Intravenous Every 12 hours 11/30/20 0043 12/01/20 0937   11/30/20 0130  vancomycin (VANCOREADY) IVPB 1500 mg/300 mL        1,500 mg 150 mL/hr over 120 Minutes Intravenous  Once 11/30/20 0043 11/30/20 0511   11/30/20 0130  ceFEPIme (MAXIPIME) 2 g in sodium chloride 0.9 % 100 mL IVPB  Status:  Discontinued        2 g 200 mL/hr over 30 Minutes Intravenous Every 12 hours 11/30/20 0043 12/01/20 0937         Micro    Objective   Vitals:   12/11/20 0004 12/11/20 0400 12/11/20 0425 12/11/20 1057  BP: 109/63 131/76  (!) 147/71  Pulse: 70 60     Resp: (!) 24 18    Temp: 97.6 F (36.4 C) 98 F (36.7 C)    TempSrc: Oral Oral    SpO2: 98% 96%    Weight: 87.9 kg  87.9 kg   Height:        Intake/Output Summary (Last 24 hours) at 12/11/2020 1448 Last data filed at 12/11/2020 0400 Gross per 24 hour  Intake 1677 ml  Output 1000 ml  Net 677 ml   Filed Weights   12/10/20 0400 12/11/20 0004 12/11/20 0425  Weight: 88 kg 87.9 kg 87.9 kg    Physical Exam:  General exam: awake, alert, no acute distress, pleasantly confused HEENT: core track in place with continuous tube feeds running, moist mucus membranes, hearing grossly normal  Respiratory system: clear b/l, no wheezes or rhonchi on exam limited by patient speaking, normal respiratory effort, on room air. Cardiovascular system: normal S1/S2, RRR, no pedal edema.   Gastrointestinal system: soft, nontender and nondistended Central nervous system: oriented to self only, no gross focal neurologic deficits, normal speech Psychiatry: normal mood, congruent affect, abnormal judgment and insight due to confusion  Labs   Data Reviewed: I have personally reviewed following labs and imaging studies  CBC: Recent Labs  Lab 12/06/20 0300 12/09/20 0150 12/10/20 0446 12/11/20 0401  WBC 10.9* 14.1* 15.7* 11.7*  HGB 12.0* 12.7* 12.1* 11.4*  HCT 36.2* 38.1* 35.8* 34.7*  MCV 89.8 89.9 89.3 91.3  PLT 364 500* 460* 952*   Basic Metabolic Panel: Recent Labs  Lab 12/05/20 0431 12/05/20 1723 12/05/20 2057 12/06/20 0300 12/07/20 0552 12/09/20 0150 12/10/20 0446  NA  --   --  136 138 133* 131* 134*  K  --   --  4.0 3.7 4.0 4.3 4.2  CL  --   --  105 104 100 100 103  CO2  --   --  $R'24 27 26 24 24  'ZV$ GLUCOSE  --   --  234* 112* 208* 262* 240*  BUN  --   --  $R'17 15 14 19 22  'zC$ CREATININE  --   --  1.19 1.17 1.08 1.18 1.10  CALCIUM  --   --  8.2* 8.2* 8.4* 8.3* 8.1*  MG 1.9 2.0  --  2.0 2.0  --  2.0  PHOS 2.6 3.3  --  3.5 2.8  --  2.8   GFR: Estimated Creatinine Clearance: 60.8 mL/min (by  C-G formula based on SCr of 1.1 mg/dL). Liver Function Tests: Recent Labs  Lab 12/06/20 0300 12/07/20 0552 12/10/20 0446  AST 33 34 19  ALT 31 35 22  ALKPHOS 42 47 50  BILITOT 0.5 0.6 0.5  PROT 5.0* 5.8* 5.7*  ALBUMIN 2.1* 2.4* 2.4*   No results for input(s): LIPASE, AMYLASE in the last 168 hours. No results for input(s): AMMONIA in the last 168 hours. Coagulation Profile: No results for input(s): INR, PROTIME in the last 168 hours. Cardiac Enzymes: No results for input(s): CKTOTAL, CKMB, CKMBINDEX, TROPONINI in the last 168 hours. BNP (last 3 results) No results for input(s): PROBNP in the last 8760 hours. HbA1C: No results for input(s): HGBA1C in the last 72 hours. CBG: Recent Labs  Lab 12/10/20 1749 12/10/20 2022 12/11/20 0002 12/11/20 0411 12/11/20 1043  GLUCAP 183* 219* 172* 219* 307*   Lipid Profile: No results for input(s): CHOL, HDL, LDLCALC, TRIG, CHOLHDL, LDLDIRECT in the last 72 hours.  Thyroid Function Tests: No results for input(s): TSH, T4TOTAL, FREET4, T3FREE, THYROIDAB in the last 72 hours. Anemia Panel: No results for input(s): VITAMINB12, FOLATE, FERRITIN, TIBC, IRON, RETICCTPCT in the last 72 hours. Sepsis Labs: No results for input(s): PROCALCITON, LATICACIDVEN in the last 168 hours.  No results found for this or any previous visit (from the past 240 hour(s)).     Imaging Studies   No results found.   Medications   Scheduled Meds:  amLODipine  10 mg Oral Daily   bisacodyl  10 mg Rectal QHS   chlorhexidine gluconate (MEDLINE KIT)  15 mL Mouth Rinse BID   Chlorhexidine Gluconate Cloth  6 each Topical Daily   enoxaparin (LOVENOX) injection  40 mg Subcutaneous Q24H   feeding supplement (PROSource TF)  45 mL Per Tube BID   insulin aspart  0-15 Units Subcutaneous Q4H   insulin aspart  3 Units Subcutaneous Q4H   mouth rinse  15 mL Mouth Rinse q12n4p   metoprolol tartrate  25 mg Per Tube BID   sodium chloride flush  3 mL Intravenous Q12H    sodium chloride flush  3 mL Intravenous Q12H   Continuous Infusions:  sodium chloride Stopped (12/04/20 1802)   sodium chloride Stopped (11/30/20 0009)   feeding supplement (OSMOLITE 1.5 CAL) 1,000 mL (12/11/20 1239)       LOS: 13 days    Time spent: 25 minutes     Ezekiel Slocumb, DO [facility]  12/11/2020, 2:48 PM      If 7PM-7AM, please contact night-coverage. How to contact the [facility] Attending or Consulting provider [place] or covering provider during after hours [place], for this patient?    Check the care team in [facility] and look for a) attending/consulting [facility] provider listed and b) the [facility] team listed Log into [email or web address] and use [place]'s universal password to access. If you do not have the password, please contact the hospital operator. Locate the [facility] provider you are looking for under [facility] and page to a number that you can be directly reached. If you still have difficulty reaching the provider, please page the [facility] (Director on Call) for the Hospitalists listed on amion for assistance.

## 2020-12-11 NOTE — Progress Notes (Signed)
Progress Note  Patient Name: Albert Garrison Date of Encounter: 12/11/2020  [facility] Cardiologist: Carlyle Dolly, MD   Subjective   No CP or dyspnea; complains of nausea  Inpatient Medications    Scheduled Meds:  amLODipine  10 mg Oral Daily   bisacodyl  10 mg Rectal QHS   chlorhexidine gluconate (MEDLINE KIT)  15 mL Mouth Rinse BID   Chlorhexidine Gluconate Cloth  6 each Topical Daily   enoxaparin (LOVENOX) injection  40 mg Subcutaneous Q24H   feeding supplement (PROSource TF)  45 mL Per Tube BID   insulin aspart  0-15 Units Subcutaneous Q4H   insulin aspart  3 Units Subcutaneous Q4H   mouth rinse  15 mL Mouth Rinse q12n4p   metoprolol tartrate  25 mg Per Tube BID   sodium chloride flush  3 mL Intravenous Q12H   sodium chloride flush  3 mL Intravenous Q12H   Continuous Infusions:  sodium chloride Stopped (12/04/20 1802)   sodium chloride Stopped (11/30/20 0009)   feeding supplement (OSMOLITE 1.5 CAL) 60 mL/hr at 12/11/20 0400   PRN Meds: sodium chloride, acetaminophen **OR** acetaminophen, bisacodyl, guaiFENesin-dextromethorphan, LORazepam, menthol-cetylpyridinium, ondansetron **OR** ondansetron (ZOFRAN) IV, sodium chloride flush   Vital Signs    Vitals:   12/11/20 0004 12/11/20 0400 12/11/20 0425 12/11/20 1057  BP: 109/63 131/76  (!) 147/71  Pulse: 70 60    Resp: (!) 24 18    Temp: 97.6 F (36.4 C) 98 F (36.7 C)    TempSrc: Oral Oral    SpO2: 98% 96%    Weight: 87.9 kg  87.9 kg   Height:        Intake/Output Summary (Last 24 hours) at 12/11/2020 1146 Last data filed at 12/11/2020 0400 Gross per 24 hour  Intake 2127 ml  Output 1310 ml  Net 817 ml    Last 3 Weights 12/11/2020 12/11/2020 12/10/2020  Weight (lbs) 193 lb 12.6 oz 193 lb 12.6 oz 194 lb 0.1 oz  Weight (kg) 87.9 kg 87.9 kg 88 kg      Telemetry    Sinus with PVCs - Personally Reviewed   Physical Exam   GEN: NAD WD Neck: Supple, no JVD Cardiac: RRR, no gallop Respiratory: CTA; no  wheeze GI: Soft, mildly distended MS: No edema Neuro: A and O x1; moves all ext Psych: Normal affect   Labs    High Sensitivity Troponin:   Recent Labs  Lab 11/29/20 2243 11/30/20 0224 11/30/20 0503 11/30/20 0730  TROPONINIHS 116* 263* 306* 285*       Chemistry Recent Labs  Lab 12/06/20 0300 12/07/20 0552 12/09/20 0150 12/10/20 0446  NA 138 133* 131* 134*  K 3.7 4.0 4.3 4.2  CL 104 100 100 103  CO2 $Re'27 26 24 24  'zpf$ GLUCOSE 112* 208* 262* 240*  BUN $Re'15 14 19 22  'Ggf$ CREATININE 1.17 1.08 1.18 1.10  CALCIUM 8.2* 8.4* 8.3* 8.1*  PROT 5.0* 5.8*  --  5.7*  ALBUMIN 2.1* 2.4*  --  2.4*  AST 33 34  --  19  ALT 31 35  --  22  ALKPHOS 42 47  --  50  BILITOT 0.5 0.6  --  0.5  GFRNONAA >60 >60 >60 >60  ANIONGAP $RemoveB'7 7 7 7      'gOdZKPlt$ Hematology Recent Labs  Lab 12/09/20 0150 12/10/20 0446 12/11/20 0401  WBC 14.1* 15.7* 11.7*  RBC 4.24 4.01* 3.80*  HGB 12.7* 12.1* 11.4*  HCT 38.1* 35.8* 34.7*  MCV 89.9 89.3  91.3  MCH 30.0 30.2 30.0  MCHC 33.3 33.8 32.9  RDW 13.5 13.7 13.8  PLT 500* 460* 485*     Patient Profile     75 y.o. male with history of CVA 2019, PVCs, NSVT, DM2, hyponatremia, hypertension, CKD stage II, KPH, sleep apnea (not on CPAP). Previously seen by cardiology - a) hx elevated troponin in setting of stroke (echo with normal LVEF at that time) and PVCs, brief NSVT previously seen on telemetry, managed conservatively. Presented to the hospital with SBO, hyponatremia (Na 118 on 8/26) and acute encephalopathy. During admission he developed NSVT then more sustained runs, progressive IVCD by EKG, then reportedly  bradycardia and unresponsiveness. He had compressions for under a minute with ROSC, no shocks delivered. He was intubated for hypoxic respiratory failure and required pressor support. He was transferred to [facility] for further management. Hospital course also complicated by dysphagia (requiring [employer]), persistent confusion, AKI on CKD stage II, fever (felt possibly due to  aspiration during arrest s/p Unasyn). Troponin elevated to 306.   Assessment & Plan    1 history of cardiac arrest with nonsustained ventricular tachycardia followed by bradycardia-echocardiogram shows ejection fraction 65 to 70%, mild left ventricular hypertrophy, mild aortic insufficiency, mildly dilated aortic root.  Plan previously had been cardiac catheterization to exclude coronary disease.  However he remains confused.  He is alert to person but not to place or time.  I have been concerned about his ability to cooperate during a cardiac catheterization.  Unless his mental status improved significantly I would favor medical therapy.    2 confusion-there appears to be some baseline dementia which was exacerbated by previous events at [facility].  His improvement appears to have leveled off.  If he does not continue to improve it is not clear to me that cardiac catheterization would be beneficial.  3 nonsustained ventricular tachycardia-continue beta-blocker.  4 recent small bowel obstruction-management per primary service.  We will see again on Monday to reassess but at this point I would lean towards conservative measures.  Palliative care following.  For questions or updates, please contact [place] Please consult [email or web address] for contact info under        Signed, Kirk Ruths, MD  12/11/2020, 11:46 AM

## 2020-12-12 DIAGNOSIS — K56609 Unspecified intestinal obstruction, unspecified as to partial versus complete obstruction: Secondary | ICD-10-CM | POA: Diagnosis not present

## 2020-12-12 LAB — GLUCOSE, CAPILLARY
Glucose-Capillary: 152 mg/dL — ABNORMAL HIGH (ref 70–99)
Glucose-Capillary: 171 mg/dL — ABNORMAL HIGH (ref 70–99)
Glucose-Capillary: 187 mg/dL — ABNORMAL HIGH (ref 70–99)
Glucose-Capillary: 216 mg/dL — ABNORMAL HIGH (ref 70–99)
Glucose-Capillary: 218 mg/dL — ABNORMAL HIGH (ref 70–99)
Glucose-Capillary: 229 mg/dL — ABNORMAL HIGH (ref 70–99)

## 2020-12-12 MED ORDER — CHLORHEXIDINE GLUCONATE 0.12 % MT SOLN
OROMUCOSAL | Status: AC
  Administered 2020-12-12: 15 mL via OROMUCOSAL
  Filled 2020-12-12: qty 15

## 2020-12-12 NOTE — Plan of Care (Signed)

## 2020-12-12 NOTE — Plan of Care (Signed)
Patient has improved in some areas and inother areas patient is still confused and pulling at lines. Restraints had to be applied.

## 2020-12-12 NOTE — Progress Notes (Signed)
PROGRESS NOTE    Albert Garrison   SPQ:[id number]  DOB: Sep 22, 1945  PCP: Celene Squibb, MD    DOA: 11/27/2020 LOS: 48    Brief Narrative / Hospital Course to Date:   75 year old male with history of hypertension, hyperlipidemia, former smoker, DM2, history of lower GI bleed, chronic kidney disease stage II who initially presented to [facility] with complaints of nausea, poor p.o. intake and decreased appetite.  Admitted on 8/26.  Patient was hyponatremic with sodium 118 with AKI and leukocytosis.  Abdominal x-ray on hospital day 2 showed a small bowel obstruction and general surgery was consulted.  CT scan abdomen pelvis showed a transition point in the right mid abdomen.    Hospital course complicated by increased ectopy on telemetry and ultimately patient had a cardiac arrest late evening of 8/28.  He became unresponsible and seen to have agonal respirations.  CODE BLUE was called and ROSC was achieved in about 5 minutes.  Due to concern for new onset left bundle branch block/STEMI, patient was started on heparin and amiodarone.  Cardiology was consulted and patient was transferred to [facility].  Significant events 8/26 Admit to [facility] 8/26 N/V 8/27 KUB with concern for distal SBO. ECHO with LVEF 55-60%, no RWMA, grade I diastolic dysfunction 3/33 CT ABD with small bowel obstruction with likely transition point in the right mid abdomen.  CT Head negative.  8/28 cardiac arrest >>NSVT with somewhat more sustained runs, progressive IVCD by ECG, reportedly bradycardia and unresponsiveness.  He was treated with atropine and epinephrine, had compressions under a minute with ROSC, no shocks delivered.  He was intubated and did require initiation of pressors including Levophed and vasopressin, also started on amiodarone 8/29 Transfer to [facility] for surgical / cardiac ongoing evaluation 8/29 Echo LVEF 65-70%, otherwise grossly normal.  8/30 extubated  9/1-TRH assumed care  Assessment &  Plan   Principal Problem:   SBO (small bowel obstruction) (HCC) Active Problems:   Hyponatremia   Acute encephalopathy   Type 2 diabetes mellitus ([place])   Acute hypoxemic respiratory failure (HCC)   Shock circulatory ([place])   Cardiac arrest ([place])   Other specified cardiac arrhythmias--- sleep apnea- arrhythmias when he desaturates    Endotracheally intubated   NSVT, bradycardic arrest, concern for LBBB/STEMI -  Status post CODE BLUE cardiac arrest requiring resuscitation. Cardiology following, follow-up recommendations. Initially on amiodarone, has transitioned to metoprolol. Treated with IV heparin for few days, discontinued. Lovenox for VTE prophylaxis Plans for cardiac cath on hold given his persistent encephalopathy, procedure would be very high risk with patient's mental status.    Small bowel obstruction -resolved.  Present on admission with CT showing transition point in the right mid abdomen.  General surgery was consulted.  Obstruction improved with conservative management however patient failed swallow evaluation due to altered mental status.  Core track in place and on tube feeds for nutrition.  Dysphagia -SLP following, appreciate recommendations.  Continue with core track and tube feeds per RD  Leukocytosis - WBC up on 9/7-8 at 14.1 >> 15.7, but improved to 11.7.  Pt afebrile.  Concern for ongoing aspiration.   Noted on rounds today somewhat wet-sounding cough.  Monitor closely.  Acute metabolic encephalopathy -MRI brain was obtained on 9/3 due to persistent confusion, it showed no evidence for anoxic brain injury.  Mental status slowly improving but pt remains confused, poor insight.  AKI superimposed on CKD stage II -renal function now near baseline (creatinine approximately 1.2) --  Monitor renal function  Fever -resolved.  Thought due to aspiration during his cardiac arrest event.  He completed 5 days of Unasyn.  Monitor clinically off antibiotics.  Symptomatic  hyponatremia -improved and sodium level improving.  Continue to monitor BMP.  Acute respiratory failure with hypoxia in the setting of cardiac arrest -resolved.  Required intubation and was extubated on 8/30.  Monitor O2 sat and supplement to maintain sats above 90%, wean as tolerated  Type 2 diabetes -uncontrolled, A1c 8.6%.  Continue sliding scale NovoLog.  Hypertension -continue metoprolol  Hematuria - onset reported by RN on 9/7 due to pt pulling at Foley.  Foley was replaced with 20 Fr on 9/8.  Some blood noted at urethral meatus after replacement likely urethral injury.   --Monitor   Patient BMI: Body mass index is 24.75 kg/m.   DVT prophylaxis: enoxaparin (LOVENOX) injection 40 mg Start: 12/03/20 1600 SCDs Start: 11/27/20 1854 Place TED hose Start: 11/27/20 1854   Diet:  Diet Orders (From admission, onward)     Start     Ordered   12/09/20 1412  DIET - DYS 1 Room service appropriate? Yes; Fluid consistency: Honey Thick  Diet effective now       Question Answer Comment  Room service appropriate? Yes   Fluid consistency: Honey Thick      12/09/20 1411              Code Status: DNR   Subjective 12/12/20    Patient awake resting in bed when seen this morning.  He states he does not feel well today but unable to give any specific physical complaints.  He wants to get out of the hospital, says his aunt lives right down the road.  He wants to go stay with her.  Wants to get up and walk around today as well.  No acute events reported.   Disposition Plan & Communication   Status is: Inpatient  Remains inpatient appropriate because:Inpatient level of care appropriate due to severity of illness.  Amite discussions ongoing.  Requiring NG tube feeds.  Required SNF placement.  Dispo: The patient is from: Home              Anticipated d/c is to: SNF              Patient currently is not medically stable to d/c.   Difficult to place patient No    Consults, Procedures,  Significant Events   Consultants:  PCCM General surgery Cardiology Palliative care  Procedures:  2D echo:  1. Left ventricular ejection fraction, by estimation, is 65 to 70%. The left ventricle has normal function. The left ventricle has no regional wall motion abnormalities. There is mild left ventricular hypertrophy. Left ventricular diastolic parameters are indeterminate.   2. Right ventricular systolic function is normal. The right ventricular size is normal. Tricuspid regurgitation signal is inadequate for assessing PA pressure.   3. The mitral valve is grossly normal.   4. Aortic valve regurgitation is mild.   5. Aortic dilatation noted. There is mild dilatation of the aortic root, measuring 42 mm.  Antimicrobials:  Anti-infectives (From admission, onward)    Start     Dose/Rate Route Frequency Ordered Stop   12/02/20 1511  ampicillin-sulbactam (UNASYN) 1.5 g in sodium chloride 0.9 % 100 mL IVPB        1.5 g 200 mL/hr over 30 Minutes Intravenous Every 6 hours 12/02/20 1324 12/05/20 0903   12/01/20 1630  ampicillin-sulbactam (UNASYN) 1.5 g  in sodium chloride 0.9 % 100 mL IVPB  Status:  Discontinued        1.5 g 200 mL/hr over 30 Minutes Intravenous Every 6 hours 12/01/20 1531 12/01/20 1534   12/01/20 1630  ampicillin-sulbactam (UNASYN) 1.5 g in sodium chloride 0.9 % 100 mL IVPB  Status:  Discontinued        1.5 g 200 mL/hr over 30 Minutes Intravenous Every 8 hours 12/01/20 1534 12/02/20 1324   11/30/20 1000  vancomycin (VANCOREADY) IVPB 750 mg/150 mL  Status:  Discontinued        750 mg 150 mL/hr over 60 Minutes Intravenous Every 12 hours 11/30/20 0043 12/01/20 0937   11/30/20 0130  vancomycin (VANCOREADY) IVPB 1500 mg/300 mL        1,500 mg 150 mL/hr over 120 Minutes Intravenous  Once 11/30/20 0043 11/30/20 0511   11/30/20 0130  ceFEPIme (MAXIPIME) 2 g in sodium chloride 0.9 % 100 mL IVPB  Status:  Discontinued        2 g 200 mL/hr over 30 Minutes Intravenous Every 12 hours  11/30/20 0043 12/01/20 0937         Micro    Objective   Vitals:   12/12/20 0009 12/12/20 0430 12/12/20 0435 12/12/20 0800  BP: 125/62 124/64  133/60  Pulse: 67 78  71  Resp: 16 14  (!) 24  Temp: (!) 97.5 F (36.4 C) 97.9 F (36.6 C)  98.4 F (36.9 C)  TempSrc: Oral Oral  Oral  SpO2: 97% 97%  95%  Weight:   80.5 kg   Height:        Intake/Output Summary (Last 24 hours) at 12/12/2020 1422 Last data filed at 12/12/2020 1359 Gross per 24 hour  Intake 500 ml  Output 1700 ml  Net -1200 ml   Filed Weights   12/11/20 0004 12/11/20 0425 12/12/20 0435  Weight: 87.9 kg 87.9 kg 80.5 kg    Physical Exam:  General exam: awake, alert, no acute distress, pleasantly confused HEENT: core track in place with continuous tube feeds running Respiratory system: clear b/l, no wheezes or rhonchi on exam limited by patient speaking, normal respiratory effort, on room air. Cardiovascular system: normal S1/S2, RRR, no pedal edema.   Gastrointestinal system: soft, nontender and nondistended Central nervous system: oriented to self and hospital but not time town or situation, no gross focal neurologic deficits, normal speech Psychiatry: normal mood, congruent affect, abnormal judgment and insight due to confusion  Labs   Data Reviewed: I have personally reviewed following labs and imaging studies  CBC: Recent Labs  Lab 12/06/20 0300 12/09/20 0150 12/10/20 0446 12/11/20 0401  WBC 10.9* 14.1* 15.7* 11.7*  HGB 12.0* 12.7* 12.1* 11.4*  HCT 36.2* 38.1* 35.8* 34.7*  MCV 89.8 89.9 89.3 91.3  PLT 364 500* 460* 035*   Basic Metabolic Panel: Recent Labs  Lab 12/05/20 1723 12/05/20 2057 12/06/20 0300 12/07/20 0552 12/09/20 0150 12/10/20 0446  NA  --  136 138 133* 131* 134*  K  --  4.0 3.7 4.0 4.3 4.2  CL  --  105 104 100 100 103  CO2  --  $R'24 27 26 24 24  'Kw$ GLUCOSE  --  234* 112* 208* 262* 240*  BUN  --  $R'17 15 14 19 22  'AH$ CREATININE  --  1.19 1.17 1.08 1.18 1.10  CALCIUM  --  8.2*  8.2* 8.4* 8.3* 8.1*  MG 2.0  --  2.0 2.0  --  2.0  PHOS 3.3  --  3.5 2.8  --  2.8   GFR: Estimated Creatinine Clearance: 60.8 mL/min (by C-G formula based on SCr of 1.1 mg/dL). Liver Function Tests: Recent Labs  Lab 12/06/20 0300 12/07/20 0552 12/10/20 0446  AST 33 34 19  ALT 31 35 22  ALKPHOS 42 47 50  BILITOT 0.5 0.6 0.5  PROT 5.0* 5.8* 5.7*  ALBUMIN 2.1* 2.4* 2.4*   No results for input(s): LIPASE, AMYLASE in the last 168 hours. No results for input(s): AMMONIA in the last 168 hours. Coagulation Profile: No results for input(s): INR, PROTIME in the last 168 hours. Cardiac Enzymes: No results for input(s): CKTOTAL, CKMB, CKMBINDEX, TROPONINI in the last 168 hours. BNP (last 3 results) No results for input(s): PROBNP in the last 8760 hours. HbA1C: No results for input(s): HGBA1C in the last 72 hours. CBG: Recent Labs  Lab 12/11/20 2121 12/12/20 0052 12/12/20 0331 12/12/20 0805 12/12/20 1118  GLUCAP 285* 171* 152* 218* 216*   Lipid Profile: No results for input(s): CHOL, HDL, LDLCALC, TRIG, CHOLHDL, LDLDIRECT in the last 72 hours.  Thyroid Function Tests: No results for input(s): TSH, T4TOTAL, FREET4, T3FREE, THYROIDAB in the last 72 hours. Anemia Panel: No results for input(s): VITAMINB12, FOLATE, FERRITIN, TIBC, IRON, RETICCTPCT in the last 72 hours. Sepsis Labs: No results for input(s): PROCALCITON, LATICACIDVEN in the last 168 hours.  No results found for this or any previous visit (from the past 240 hour(s)).     Imaging Studies   No results found.   Medications   Scheduled Meds:  amLODipine  10 mg Oral Daily   bisacodyl  10 mg Rectal QHS   chlorhexidine gluconate (MEDLINE KIT)  15 mL Mouth Rinse BID   Chlorhexidine Gluconate Cloth  6 each Topical Daily   enoxaparin (LOVENOX) injection  40 mg Subcutaneous Q24H   feeding supplement (PROSource TF)  45 mL Per Tube BID   insulin aspart  0-15 Units Subcutaneous Q4H   insulin aspart  3 Units  Subcutaneous Q4H   mouth rinse  15 mL Mouth Rinse q12n4p   metoprolol tartrate  25 mg Per Tube BID   sodium chloride flush  3 mL Intravenous Q12H   sodium chloride flush  3 mL Intravenous Q12H   Continuous Infusions:  sodium chloride Stopped (12/04/20 1802)   sodium chloride Stopped (11/30/20 0009)   feeding supplement (OSMOLITE 1.5 CAL) 60 mL/hr at 12/11/20 1800       LOS: 14 days    Time spent: 25 minutes with > 50% spent at bedside and in coordination of care     Ezekiel Slocumb, DO [facility]  12/12/2020, 2:22 PM      If 7PM-7AM, please contact night-coverage. How to contact the [facility] Attending or Consulting provider [place] or covering provider during after hours [place], for this patient?    Check the care team in [facility] and look for a) attending/consulting [facility] provider listed and b) the [facility] team listed Log into [email or web address] and use [place]'s universal password to access. If you do not have the password, please contact the hospital operator. Locate the [facility] provider you are looking for under [facility] and page to a number that you can be directly reached. If you still have difficulty reaching the provider, please page the [facility] (Director on Call) for the Hospitalists listed on amion for assistance.

## 2020-12-13 DIAGNOSIS — K56609 Unspecified intestinal obstruction, unspecified as to partial versus complete obstruction: Secondary | ICD-10-CM | POA: Diagnosis not present

## 2020-12-13 LAB — GLUCOSE, CAPILLARY
Glucose-Capillary: 136 mg/dL — ABNORMAL HIGH (ref 70–99)
Glucose-Capillary: 164 mg/dL — ABNORMAL HIGH (ref 70–99)
Glucose-Capillary: 209 mg/dL — ABNORMAL HIGH (ref 70–99)
Glucose-Capillary: 221 mg/dL — ABNORMAL HIGH (ref 70–99)
Glucose-Capillary: 258 mg/dL — ABNORMAL HIGH (ref 70–99)
Glucose-Capillary: 304 mg/dL — ABNORMAL HIGH (ref 70–99)

## 2020-12-13 LAB — CBC
HCT: 36.7 % — ABNORMAL LOW (ref 39.0–52.0)
Hemoglobin: 12 g/dL — ABNORMAL LOW (ref 13.0–17.0)
MCH: 29.8 pg (ref 26.0–34.0)
MCHC: 32.7 g/dL (ref 30.0–36.0)
MCV: 91.1 fL (ref 80.0–100.0)
Platelets: 547 10*3/uL — ABNORMAL HIGH (ref 150–400)
RBC: 4.03 MIL/uL — ABNORMAL LOW (ref 4.22–5.81)
RDW: 13.5 % (ref 11.5–15.5)
WBC: 8.9 10*3/uL (ref 4.0–10.5)
nRBC: 0 % (ref 0.0–0.2)

## 2020-12-13 MED ORDER — INSULIN ASPART 100 UNIT/ML IJ SOLN
4.0000 [IU] | INTRAMUSCULAR | Status: DC
  Administered 2020-12-13 – 2020-12-17 (×24): 4 [IU] via SUBCUTANEOUS

## 2020-12-13 NOTE — Progress Notes (Addendum)
PROGRESS NOTE    Albert Garrison   FWY:[id number]  DOB: [birth year]  PCP: Celene Squibb, MD    DOA: 11/27/2020 LOS: 64    Brief Narrative / Hospital Course to Date:   75 year old male with history of hypertension, hyperlipidemia, former smoker, DM2, history of lower GI bleed, chronic kidney disease stage II who initially presented to [facility] with complaints of nausea, poor p.o. intake and decreased appetite.  Admitted on 8/26.  Patient was hyponatremic with sodium 118 with AKI and leukocytosis.  Abdominal x-ray on hospital day 2 showed a small bowel obstruction and general surgery was consulted.  CT scan abdomen pelvis showed a transition point in the right mid abdomen.    Hospital course complicated by increased ectopy on telemetry and ultimately patient had a cardiac arrest late evening of 8/28.  He became unresponsible and seen to have agonal respirations.  CODE BLUE was called and ROSC was achieved in about 5 minutes.  Due to concern for new onset left bundle branch block/STEMI, patient was started on heparin and amiodarone.  Cardiology was consulted and patient was transferred to [facility].  Significant events 8/26 Admit to [facility] 8/26 N/V 8/27 KUB with concern for distal SBO. ECHO with LVEF 55-60%, no RWMA, grade I diastolic dysfunction 2/77 CT ABD with small bowel obstruction with likely transition point in the right mid abdomen.  CT Head negative.  8/28 cardiac arrest >>NSVT with somewhat more sustained runs, progressive IVCD by ECG, reportedly bradycardia and unresponsiveness.  He was treated with atropine and epinephrine, had compressions under a minute with ROSC, no shocks delivered.  He was intubated and did require initiation of pressors including Levophed and vasopressin, also started on amiodarone 8/29 Transfer to [facility] for surgical / cardiac ongoing evaluation 8/29 Echo LVEF 65-70%, otherwise grossly normal.  8/30 extubated  9/1-TRH assumed care  Assessment &  Plan   Principal Problem:   SBO (small bowel obstruction) (HCC) Active Problems:   Hyponatremia   Acute encephalopathy   Type 2 diabetes mellitus ([place])   Acute hypoxemic respiratory failure (HCC)   Shock circulatory ([place])   Cardiac arrest ([place])   Other specified cardiac arrhythmias--- sleep apnea- arrhythmias when he desaturates    Endotracheally intubated   NSVT, bradycardic arrest, concern for LBBB/STEMI -  Status post CODE BLUE cardiac arrest requiring resuscitation. Cardiology following, follow-up recommendations. Initially on amiodarone, has transitioned to metoprolol. Treated with IV heparin for few days, discontinued. Lovenox for VTE prophylaxis Plans for cardiac cath on hold given his persistent encephalopathy, procedure would be very high risk with patient's mental status.    Small bowel obstruction -resolved.  Present on admission with CT showing transition point in the right mid abdomen.  General surgery was consulted.  Obstruction improved with conservative management however patient failed swallow evaluation due to altered mental status.  Core track in place and on tube feeds for nutrition.  Dysphagia -SLP following, appreciate recommendations.   Continue with core track and tube feeds per RD. On dysphagia 1 diet with honey thick liquids. Family/POA have declined PEG tube for long term nutrition.  Leukocytosis - WBC up on 9/7-8 at 14.1 >> 15.7, but improved to 11.7.  Pt afebrile.  Concern for ongoing aspiration.   Noted on rounds 9/01-2010 somewhat wet-sounding cough.  Monitor closely.  Chest x-ray worsening or febrile.  Acute metabolic encephalopathy -MRI brain was obtained on 9/3 due to persistent confusion, it showed no evidence for anoxic brain injury.  Mental  status slowly improving but pt remains confused, poor insight.  AKI superimposed on CKD stage II -renal function now near baseline (creatinine approximately 1.2) -- Monitor renal function  Fever  -resolved.  Thought due to aspiration during his cardiac arrest event.  He completed 5 days of Unasyn.  Monitor clinically off antibiotics.  Symptomatic hyponatremia -improved and sodium level improving.  Continue to monitor BMP.  Acute respiratory failure with hypoxia in the setting of cardiac arrest -resolved.  Required intubation and was extubated on 8/30.  Monitor O2 sat and supplement to maintain sats above 90%, wean as tolerated  Type 2 diabetes -uncontrolled, A1c 8.6%.  Continue sliding scale NovoLog.  Hypertension -continue metoprolol  Hematuria - onset reported by RN on 9/7 due to pt pulling at Foley.  Foley was replaced with 20 Fr on 9/8.  Some blood noted at urethral meatus after replacement likely urethral injury.   --Monitor   Patient BMI: Body mass index is 24.75 kg/m.   DVT prophylaxis: enoxaparin (LOVENOX) injection 40 mg Start: 12/03/20 1600 SCDs Start: 11/27/20 1854 Place TED hose Start: 11/27/20 1854   Diet:  Diet Orders (From admission, onward)     Start     Ordered   12/09/20 1412  DIET - DYS 1 Room service appropriate? Yes; Fluid consistency: Honey Thick  Diet effective now       Question Answer Comment  Room service appropriate? Yes   Fluid consistency: Honey Thick      12/09/20 1411              Code Status: DNR   Subjective 12/13/20    Patient awake sitting up in bed when seen this morning.  He denies any acute complaints including fever chills, nausea vomiting or abdominal pain.  Asks when he will get out of the hospital.  Otherwise remains confused with poor insight to his condition.  Continues to have cough with deep inspiration during exam.   Disposition Plan & Communication   Status is: Inpatient  Remains inpatient appropriate because:Inpatient level of care appropriate due to severity of illness.  [place] discussions ongoing.  Requiring NG tube feeds.  Required SNF placement.  Dispo: The patient is from: Home              Anticipated d/c  is to: SNF              Patient currently is not medically stable to d/c.   Difficult to place patient No    Consults, Procedures, Significant Events   Consultants:  PCCM General surgery Cardiology Palliative care  Procedures:  2D echo:  1. Left ventricular ejection fraction, by estimation, is 65 to 70%. The left ventricle has normal function. The left ventricle has no regional wall motion abnormalities. There is mild left ventricular hypertrophy. Left ventricular diastolic parameters are indeterminate.   2. Right ventricular systolic function is normal. The right ventricular size is normal. Tricuspid regurgitation signal is inadequate for assessing PA pressure.   3. The mitral valve is grossly normal.   4. Aortic valve regurgitation is mild.   5. Aortic dilatation noted. There is mild dilatation of the aortic root, measuring 42 mm.  Antimicrobials:  Anti-infectives (From admission, onward)    Start     Dose/Rate Route Frequency Ordered Stop   12/02/20 1511  ampicillin-sulbactam (UNASYN) 1.5 g in sodium chloride 0.9 % 100 mL IVPB        1.5 g 200 mL/hr over 30 Minutes Intravenous Every 6 hours 12/02/20  1324 12/05/20 0903   12/01/20 1630  ampicillin-sulbactam (UNASYN) 1.5 g in sodium chloride 0.9 % 100 mL IVPB  Status:  Discontinued        1.5 g 200 mL/hr over 30 Minutes Intravenous Every 6 hours 12/01/20 1531 12/01/20 1534   12/01/20 1630  ampicillin-sulbactam (UNASYN) 1.5 g in sodium chloride 0.9 % 100 mL IVPB  Status:  Discontinued        1.5 g 200 mL/hr over 30 Minutes Intravenous Every 8 hours 12/01/20 1534 12/02/20 1324   11/30/20 1000  vancomycin (VANCOREADY) IVPB 750 mg/150 mL  Status:  Discontinued        750 mg 150 mL/hr over 60 Minutes Intravenous Every 12 hours 11/30/20 0043 12/01/20 0937   11/30/20 0130  vancomycin (VANCOREADY) IVPB 1500 mg/300 mL        1,500 mg 150 mL/hr over 120 Minutes Intravenous  Once 11/30/20 0043 11/30/20 0511   11/30/20 0130  ceFEPIme  (MAXIPIME) 2 g in sodium chloride 0.9 % 100 mL IVPB  Status:  Discontinued        2 g 200 mL/hr over 30 Minutes Intravenous Every 12 hours 11/30/20 0043 12/01/20 0937         Micro    Objective   Vitals:   12/13/20 0754 12/13/20 0857 12/13/20 0900 12/13/20 1159  BP: (!) 156/76 (!) 143/60 (!) 143/60 (!) 154/62  Pulse: 70  70 62  Resp: (!) 22   20  Temp: 97.7 F (36.5 C)   98.2 F (36.8 C)  TempSrc: Oral   Oral  SpO2: 98%   97%  Weight:      Height:        Intake/Output Summary (Last 24 hours) at 12/13/2020 1346 Last data filed at 12/13/2020 0804 Gross per 24 hour  Intake 2158 ml  Output 1625 ml  Net 533 ml   Filed Weights   12/11/20 0004 12/11/20 0425 12/12/20 0435  Weight: 87.9 kg 87.9 kg 80.5 kg    Physical Exam:  General exam: awake, alert, no acute distress, pleasantly confused HEENT: core track in place with continuous tube feeds running Respiratory system: clear b/l, normal respiratory effort, on room air, coughs on deep inspiration. Cardiovascular system: normal S1/S2, RRR, no pedal edema.   Gastrointestinal system: soft, nontender and nondistended Central nervous system: oriented to self and hospital but not time town or situation, no gross focal neurologic deficits, normal speech Psychiatry: normal mood, congruent affect, abnormal judgment and insight due to confusion  Labs   Data Reviewed: I have personally reviewed following labs and imaging studies  CBC: Recent Labs  Lab 12/09/20 0150 12/10/20 0446 12/11/20 0401 12/13/20 0156  WBC 14.1* 15.7* 11.7* 8.9  HGB 12.7* 12.1* 11.4* 12.0*  HCT 38.1* 35.8* 34.7* 36.7*  MCV 89.9 89.3 91.3 91.1  PLT 500* 460* 485* 517*   Basic Metabolic Panel: Recent Labs  Lab 12/07/20 0552 12/09/20 0150 12/10/20 0446  NA 133* 131* 134*  K 4.0 4.3 4.2  CL 100 100 103  CO2 _0 GLUCOSE 208* 262* 240*  BUN _1 CREATININE 1.08 1.18 1.10  CALCIUM 8.4* 8.3* 8.1*  MG 2.0  --  2.0  PHOS 2.8  --  2.8    GFR: Estimated Creatinine Clearance: 60.8 mL/min (by C-G formula based on SCr of 1.1 mg/dL). Liver Function Tests: Recent Labs  Lab 12/07/20 0552 12/10/20 0446  AST 34 19  ALT 35 22  ALKPHOS 47 50  BILITOT 0.6  0.5  PROT 5.8* 5.7*  ALBUMIN 2.4* 2.4*   No results for input(s): LIPASE, AMYLASE in the last 168 hours. No results for input(s): AMMONIA in the last 168 hours. Coagulation Profile: No results for input(s): INR, PROTIME in the last 168 hours. Cardiac Enzymes: No results for input(s): CKTOTAL, CKMB, CKMBINDEX, TROPONINI in the last 168 hours. BNP (last 3 results) No results for input(s): PROBNP in the last 8760 hours. HbA1C: No results for input(s): HGBA1C in the last 72 hours. CBG: Recent Labs  Lab 12/12/20 2006 12/13/20 0006 12/13/20 0442 12/13/20 0809 12/13/20 1146  GLUCAP 229* 164* 221* 304* 209*   Lipid Profile: No results for input(s): CHOL, HDL, LDLCALC, TRIG, CHOLHDL, LDLDIRECT in the last 72 hours.  Thyroid Function Tests: No results for input(s): TSH, T4TOTAL, FREET4, T3FREE, THYROIDAB in the last 72 hours. Anemia Panel: No results for input(s): VITAMINB12, FOLATE, FERRITIN, TIBC, IRON, RETICCTPCT in the last 72 hours. Sepsis Labs: No results for input(s): PROCALCITON, LATICACIDVEN in the last 168 hours.  No results found for this or any previous visit (from the past 240 hour(s)).     Imaging Studies   No results found.   Medications   Scheduled Meds:  amLODipine  10 mg Oral Daily   bisacodyl  10 mg Rectal QHS   chlorhexidine gluconate (MEDLINE KIT)  15 mL Mouth Rinse BID   Chlorhexidine Gluconate Cloth  6 each Topical Daily   enoxaparin (LOVENOX) injection  40 mg Subcutaneous Q24H   feeding supplement (PROSource TF)  45 mL Per Tube BID   insulin aspart  0-15 Units Subcutaneous Q4H   insulin aspart  4 Units Subcutaneous Q4H   mouth rinse  15 mL Mouth Rinse q12n4p   metoprolol tartrate  25 mg Per Tube BID   sodium chloride flush  3  mL Intravenous Q12H   sodium chloride flush  3 mL Intravenous Q12H   Continuous Infusions:  sodium chloride Stopped (12/04/20 1802)   sodium chloride Stopped (11/30/20 0009)   feeding supplement (OSMOLITE 1.5 CAL) 60 mL/hr at 12/11/20 1800       LOS: 15 days    Time spent: 25 minutes with > 50% spent at bedside and in coordination of care     Ezekiel Slocumb, DO [facility]  12/13/2020, 1:46 PM      If 7PM-7AM, please contact night-coverage. How to contact the [facility] Attending or Consulting provider [place] or covering provider during after hours [place], for this patient?    Check the care team in [facility] and look for a) attending/consulting [facility] provider listed and b) the [facility] team listed Log into [email or web address] and use [place]'s universal password to access. If you do not have the password, please contact the hospital operator. Locate the [facility] provider you are looking for under [facility] and page to a number that you can be directly reached. If you still have difficulty reaching the provider, please page the [facility] (Director on Call) for the Hospitalists listed on amion for assistance.

## 2020-12-13 NOTE — Plan of Care (Signed)

## 2020-12-13 NOTE — Progress Notes (Signed)
PT's brother and healthcare POA, Gaeton Melgarejo, called to inform this norse that he had seen DNR bracelet on his brother while visiting today and stated that pt should not be DNR. Stated someone had discussed this with him last Tuesday and he told them he wasn't ready for DNR yet but didn't want a peg tube placed. Also stated that he was having phone problems that wouldn't be fixed until Wednesday of this week and lived in the [place] but gave this nurse a cell phone number we could try to reach him at for additional questions. [phone]. Will notify on call provider.

## 2020-12-14 DIAGNOSIS — G9340 Encephalopathy, unspecified: Secondary | ICD-10-CM | POA: Diagnosis not present

## 2020-12-14 DIAGNOSIS — I469 Cardiac arrest, cause unspecified: Secondary | ICD-10-CM | POA: Diagnosis not present

## 2020-12-14 DIAGNOSIS — K56609 Unspecified intestinal obstruction, unspecified as to partial versus complete obstruction: Secondary | ICD-10-CM | POA: Diagnosis not present

## 2020-12-14 LAB — COMPREHENSIVE METABOLIC PANEL
ALT: 22 U/L (ref 0–44)
AST: 22 U/L (ref 15–41)
Albumin: 2.7 g/dL — ABNORMAL LOW (ref 3.5–5.0)
Alkaline Phosphatase: 59 U/L (ref 38–126)
Anion gap: 8 (ref 5–15)
BUN: 24 mg/dL — ABNORMAL HIGH (ref 8–23)
CO2: 26 mmol/L (ref 22–32)
Calcium: 8.8 mg/dL — ABNORMAL LOW (ref 8.9–10.3)
Chloride: 105 mmol/L (ref 98–111)
Creatinine, Ser: 1.14 mg/dL (ref 0.61–1.24)
GFR, Estimated: >60 mL/min (ref >60)
Glucose, Bld: 155 mg/dL — ABNORMAL HIGH (ref 70–99)
Potassium: 4.2 mmol/L (ref 3.5–5.1)
Sodium: 139 mmol/L (ref 135–145)
Total Bilirubin: 0.6 mg/dL (ref 0.3–1.2)
Total Protein: 6.6 g/dL (ref 6.5–8.1)

## 2020-12-14 LAB — LIPID PANEL
Cholesterol: 107 mg/dL (ref 0–200)
HDL: 27 mg/dL — ABNORMAL LOW (ref >40)
LDL Cholesterol: 57 mg/dL (ref 0–99)
Total CHOL/HDL Ratio: 4 RATIO
Triglycerides: 114 mg/dL (ref <150)
VLDL: 23 mg/dL (ref 0–40)

## 2020-12-14 LAB — PHOSPHORUS: Phosphorus: 3.7 mg/dL (ref 2.5–4.6)

## 2020-12-14 LAB — GLUCOSE, CAPILLARY
Glucose-Capillary: 183 mg/dL — ABNORMAL HIGH (ref 70–99)
Glucose-Capillary: 199 mg/dL — ABNORMAL HIGH (ref 70–99)
Glucose-Capillary: 206 mg/dL — ABNORMAL HIGH (ref 70–99)
Glucose-Capillary: 223 mg/dL — ABNORMAL HIGH (ref 70–99)
Glucose-Capillary: 231 mg/dL — ABNORMAL HIGH (ref 70–99)
Glucose-Capillary: 246 mg/dL — ABNORMAL HIGH (ref 70–99)

## 2020-12-14 LAB — MAGNESIUM: Magnesium: 2.1 mg/dL (ref 1.7–2.4)

## 2020-12-14 LAB — TRIGLYCERIDES: Triglycerides: 106 mg/dL (ref <150)

## 2020-12-14 MED ORDER — ASPIRIN 81 MG PO CHEW
81.0000 mg | CHEWABLE_TABLET | Freq: Every day | ORAL | Status: DC
  Administered 2020-12-14 – 2020-12-23 (×10): 81 mg via ORAL
  Filled 2020-12-14 (×10): qty 1

## 2020-12-14 MED ORDER — IRBESARTAN 300 MG PO TABS: 150.0000 mg | ORAL_TABLET | Freq: Every day | ORAL | Status: DC

## 2020-12-14 MED ORDER — ASPIRIN 81 MG PO CHEW: 81.0000 mg | CHEWABLE_TABLET | Freq: Once | ORAL | Status: DC

## 2020-12-14 MED ORDER — ATORVASTATIN CALCIUM 40 MG PO TABS
40.0000 mg | ORAL_TABLET | Freq: Every day | ORAL | Status: DC
  Administered 2020-12-14 – 2020-12-23 (×10): 40 mg via ORAL
  Filled 2020-12-14 (×10): qty 1

## 2020-12-14 MED ORDER — IRBESARTAN 75 MG PO TABS
75.0000 mg | ORAL_TABLET | Freq: Every day | ORAL | Status: DC
  Administered 2020-12-14 – 2020-12-18 (×5): 75 mg via ORAL
  Filled 2020-12-14 (×5): qty 1

## 2020-12-14 NOTE — Progress Notes (Signed)
  Speech Language Pathology Treatment: Dysphagia  Patient Details Name: Albert Garrison MRN: [id number] DOB: Mar 03, 1946 Today's Date: 12/14/2020 Time: 1030-1040 SLP Time Calculation (min) (ACUTE ONLY): 10 min  Assessment / Plan / Recommendation Clinical Impression  Pt demonstrates adequate arousal, still requires encouragement to accept PO though he does not complain about thickened liquids and states that he likes the juice SLP gave him. Pt was abel to masticate regular solids and follow with sips of honey. Intermittent coughing throughout session, though not immediately following swallowing. Recommend upgrading solids to dys 2/honey to make meals more appetizing. Will follow for further ability to upgrade. Poor nutritional intake seems to be more of a cognitive problem and may be ongoing.   HPI HPI: 75 y.o. male presented to [facility] ED with diarrhea, decreased p.o. intake and nausea. Pt admitted with acute encephalopathy and symptomatic hyponatremia. Abdominal distension 8/27. CT abd/pelvis (8/28) (+) SBO and NGT placed. 8/28 pt went into cardiac arrest, concern for possible STEMI. Transferred to [facility] for further treatment. CT head (8/28) revealed no acute intracranial abnormality. Chest xray (8/30) noted "appearance of airspace disease in the right middle lobe. Small left pleural effusion". Hosptial admission complicated by continued impulsivity and agitation with pt pulling out multiple NGT's despite restraints.  ETT: 8/28-8/30. PMHx significant for CKD, BPH, DMII, Hx of CVA, HTN, and GERD. SLE (08/23/2017) revealed cognitive communication deficits post CVA.      SLP Plan  Continue with current plan of care       Recommendations  Diet recommendations: Dysphagia 2 (fine chop);Honey-thick liquid Liquids provided via: Cup Medication Administration: Via alternative means Supervision: Full supervision/cueing for compensatory strategies Compensations: Minimize environmental distractions;Slow  rate;Small sips/bites Postural Changes and/or Swallow Maneuvers: Seated upright 90 degrees                Oral Care Recommendations: Oral care BID;Staff/trained caregiver to provide oral care Follow up Recommendations: Other (comment) Plan: Continue with current plan of care       GO               Albert [place], MA [place] Pager [phone] Office [phone]  Albert Garrison 12/14/2020, 12:46 PM

## 2020-12-14 NOTE — Progress Notes (Addendum)
PROGRESS NOTE    Albert Garrison   HYW:[id number]  DOB: 08/18/1945  PCP: Celene Squibb, MD    DOA: 11/27/2020 LOS: 54    Brief Narrative / Hospital Course to Date:   75 year old male with history of hypertension, hyperlipidemia, former smoker, DM2, history of lower GI bleed, chronic kidney disease stage II who initially presented to [facility] with complaints of nausea, poor p.o. intake and decreased appetite.  Admitted on 8/26.  Patient was hyponatremic with sodium 118 with AKI and leukocytosis.  Abdominal x-ray on hospital day 2 showed a small bowel obstruction and general surgery was consulted.  CT scan abdomen pelvis showed a transition point in the right mid abdomen.    Hospital course complicated by increased ectopy on telemetry and ultimately patient had a cardiac arrest late evening of 8/28.  He became unresponsible and seen to have agonal respirations.  CODE BLUE was called and ROSC was achieved in about 5 minutes.  Due to concern for new onset left bundle branch block/STEMI, patient was started on heparin and amiodarone.  Cardiology was consulted and patient was transferred to [facility].  Significant events 8/26 Admit to [facility] 8/26 N/V 8/27 KUB with concern for distal SBO. ECHO with LVEF 55-60%, no RWMA, grade I diastolic dysfunction 4/85 CT ABD with small bowel obstruction with likely transition point in the right mid abdomen.  CT Head negative.  8/28 cardiac arrest >>NSVT with somewhat more sustained runs, progressive IVCD by ECG, reportedly bradycardia and unresponsiveness.  He was treated with atropine and epinephrine, had compressions under a minute with ROSC, no shocks delivered.  He was intubated and did require initiation of pressors including Levophed and vasopressin, also started on amiodarone 8/29 Transfer to [facility] for surgical / cardiac ongoing evaluation 8/29 Echo LVEF 65-70%, otherwise grossly normal.  8/30 extubated  9/1-TRH assumed care   Pt remains  on tube feeds by [employer].  Family previously declined PEG, but want full code.  Long term nutrition needs to be addressed for discharge from hospital to SNF.  Assessment & Plan   Principal Problem:   SBO (small bowel obstruction) (HCC) Active Problems:   Hyponatremia   Acute encephalopathy   Type 2 diabetes mellitus ([place])   Acute hypoxemic respiratory failure (HCC)   Shock circulatory (HCC)   Cardiac arrest (HCC)   Other specified cardiac arrhythmias--- sleep apnea- arrhythmias when he desaturates    Endotracheally intubated   NSVT, bradycardic arrest, concern for LBBB/STEMI -  Status post CODE BLUE cardiac arrest requiring resuscitation. Cardiology following, follow-up recommendations. Initially on amiodarone, has transitioned to metoprolol. Treated with IV heparin for few days, discontinued. Lovenox for VTE prophylaxis Plans for cardiac cath on hold given his persistent encephalopathy, procedure would be very high risk with patient's mental status.    Small bowel obstruction -resolved.  Present on admission with CT showing transition point in the right mid abdomen.  General surgery was consulted.  Obstruction improved with conservative management however patient failed swallow evaluation due to altered mental status.  Core track in place and on tube feeds for nutrition.  Dysphagia -SLP following, appreciate recommendations.   Continue with core track and tube feeds per RD. On dysphagia 2 diet with honey thick liquids. --Pt eating minimally by mouth Family/POA have declined PEG tube for long term nutrition. --If pt to remain Full Code status, recommend PEG to continue tube feeds at SNF.  Leukocytosis - WBC up on 9/7-8 at 14.1 >> 15.7, but improved  to 11.7.  Pt afebrile.  Concern for ongoing aspiration.   Noted on rounds 9/01-2010 somewhat wet-sounding cough.  Monitor closely.  Chest x-ray worsening or febrile.  Acute metabolic encephalopathy -MRI brain was obtained on 9/3 due  to persistent confusion, it showed no evidence for anoxic brain injury.  Mental status slowly improving but pt remains confused, poor insight.  AKI superimposed on CKD stage II -renal function now near baseline (creatinine approximately 1.2) -- Monitor renal function  Fever -resolved.  Thought due to aspiration during his cardiac arrest event.  He completed 5 days of Unasyn.  Monitor clinically off antibiotics.  Symptomatic hyponatremia -improved and sodium level improving.  Continue to monitor BMP.  Acute respiratory failure with hypoxia in the setting of cardiac arrest -resolved.  Required intubation and was extubated on 8/30.  Monitor O2 sat and supplement to maintain sats above 90%, wean as tolerated  Type 2 diabetes -uncontrolled, A1c 8.6%.  Continue sliding scale NovoLog.  Hypertension -continue metoprolol  Hematuria - onset reported by RN on 9/7 due to pt pulling at Foley.  Foley was replaced with 20 Fr on 9/8.  Some blood noted at urethral meatus after replacement likely urethral injury.   --Monitor   Patient BMI: Body mass index is 23.71 kg/m.   DVT prophylaxis: enoxaparin (LOVENOX) injection 40 mg Start: 12/03/20 1600 SCDs Start: 11/27/20 1854 Place TED hose Start: 11/27/20 1854   Diet:  Diet Orders (From admission, onward)     Start     Ordered   12/14/20 1243  DIET DYS 2 Room service appropriate? Yes; Fluid consistency: Thin  Diet effective now       Question Answer Comment  Room service appropriate? Yes   Fluid consistency: Thin      12/14/20 1242              Code Status: Full Code   Subjective 12/14/20    Patient awake sitting up in bed when seen this morning.  He denies any acute complaints including fever chills, nausea vomiting or abdominal pain.  Asks when he will get out of the hospital.  Otherwise remains confused with poor insight to his condition.  Continues to have cough with deep inspiration during exam.   Disposition Plan & Communication    Status is: Inpatient  Remains inpatient appropriate because:Inpatient level of care appropriate due to severity of illness.  [place] discussions ongoing.  Requiring NG tube feeds.  Required SNF placement.  Dispo: The patient is from: Home              Anticipated d/c is to: SNF              Patient currently is not medically stable to d/c.   Difficult to place patient No    Consults, Procedures, Significant Events   Consultants:  PCCM General surgery Cardiology Palliative care  Procedures:  2D echo:  1. Left ventricular ejection fraction, by estimation, is 65 to 70%. The left ventricle has normal function. The left ventricle has no regional wall motion abnormalities. There is mild left ventricular hypertrophy. Left ventricular diastolic parameters are indeterminate.   2. Right ventricular systolic function is normal. The right ventricular size is normal. Tricuspid regurgitation signal is inadequate for assessing PA pressure.   3. The mitral valve is grossly normal.   4. Aortic valve regurgitation is mild.   5. Aortic dilatation noted. There is mild dilatation of the aortic root, measuring 42 mm.  Antimicrobials:  Anti-infectives (From admission,  onward)    Start     Dose/Rate Route Frequency Ordered Stop   12/02/20 1511  ampicillin-sulbactam (UNASYN) 1.5 g in sodium chloride 0.9 % 100 mL IVPB        1.5 g 200 mL/hr over 30 Minutes Intravenous Every 6 hours 12/02/20 1324 12/05/20 0903   12/01/20 1630  ampicillin-sulbactam (UNASYN) 1.5 g in sodium chloride 0.9 % 100 mL IVPB  Status:  Discontinued        1.5 g 200 mL/hr over 30 Minutes Intravenous Every 6 hours 12/01/20 1531 12/01/20 1534   12/01/20 1630  ampicillin-sulbactam (UNASYN) 1.5 g in sodium chloride 0.9 % 100 mL IVPB  Status:  Discontinued        1.5 g 200 mL/hr over 30 Minutes Intravenous Every 8 hours 12/01/20 1534 12/02/20 1324   11/30/20 1000  vancomycin (VANCOREADY) IVPB 750 mg/150 mL  Status:  Discontinued         750 mg 150 mL/hr over 60 Minutes Intravenous Every 12 hours 11/30/20 0043 12/01/20 0937   11/30/20 0130  vancomycin (VANCOREADY) IVPB 1500 mg/300 mL        1,500 mg 150 mL/hr over 120 Minutes Intravenous  Once 11/30/20 0043 11/30/20 0511   11/30/20 0130  ceFEPIme (MAXIPIME) 2 g in sodium chloride 0.9 % 100 mL IVPB  Status:  Discontinued        2 g 200 mL/hr over 30 Minutes Intravenous Every 12 hours 11/30/20 0043 12/01/20 0937         Micro    Objective   Vitals:   12/13/20 1950 12/14/20 0000 12/14/20 0400 12/14/20 0822  BP: (!) 145/72 (!) 157/76 (!) 142/71 (!) 152/78  Pulse: 70 68  85  Resp: _0 Temp: 98.1 F (36.7 C) 97.6 F (36.4 C) 97.6 F (36.4 C) 97.7 F (36.5 C)  TempSrc: Oral Oral Oral Oral  SpO2: 93% 97%  96%  Weight:   77.1 kg   Height:        Intake/Output Summary (Last 24 hours) at 12/14/2020 1301 Last data filed at 12/14/2020 0917 Gross per 24 hour  Intake 989.67 ml  Output 1900 ml  Net -910.33 ml   Filed Weights   12/11/20 0425 12/12/20 0435 12/14/20 0400  Weight: 87.9 kg 80.5 kg 77.1 kg    Physical Exam:  General exam: awake, alert, no acute distress, pleasantly confused HEENT: core track in place with continuous tube feeds running Respiratory system: clear b/l, normal respiratory effort, on room air, coughs on deep inspiration. Cardiovascular system: normal S1/S2, RRR, no pedal edema.   Gastrointestinal system: soft, nontender and nondistended Central nervous system: oriented to self and hospital but not time town or situation, no gross focal neurologic deficits, normal speech Psychiatry: normal mood, congruent affect, abnormal judgment and insight due to confusion  Labs   Data Reviewed: I have personally reviewed following labs and imaging studies  CBC: Recent Labs  Lab 12/09/20 0150 12/10/20 0446 12/11/20 0401 12/13/20 0156  WBC 14.1* 15.7* 11.7* 8.9  HGB 12.7* 12.1* 11.4* 12.0*  HCT 38.1* 35.8* 34.7* 36.7*  MCV 89.9 89.3  91.3 91.1  PLT 500* 460* 485* 597*   Basic Metabolic Panel: Recent Labs  Lab 12/09/20 0150 12/10/20 0446 12/14/20 0535  NA 131* 134* 139  K 4.3 4.2 4.2  CL 100 103 105  CO2 _1 GLUCOSE 262* 240* 155*  BUN 19 22 24*  CREATININE 1.18 1.10 1.14  CALCIUM 8.3* 8.1* 8.8*  MG  --  2.0 2.1  PHOS  --  2.8 3.7   GFR: Estimated Creatinine Clearance: 58.7 mL/min (by C-G formula based on SCr of 1.14 mg/dL). Liver Function Tests: Recent Labs  Lab 12/10/20 0446 12/14/20 0535  AST 19 22  ALT 22 22  ALKPHOS 50 59  BILITOT 0.5 0.6  PROT 5.7* 6.6  ALBUMIN 2.4* 2.7*   No results for input(s): LIPASE, AMYLASE in the last 168 hours. No results for input(s): AMMONIA in the last 168 hours. Coagulation Profile: No results for input(s): INR, PROTIME in the last 168 hours. Cardiac Enzymes: No results for input(s): CKTOTAL, CKMB, CKMBINDEX, TROPONINI in the last 168 hours. BNP (last 3 results) No results for input(s): PROBNP in the last 8760 hours. HbA1C: No results for input(s): HGBA1C in the last 72 hours. CBG: Recent Labs  Lab 12/13/20 2005 12/14/20 0011 12/14/20 0407 12/14/20 0818 12/14/20 1029  GLUCAP 258* 199* 183* 223* 246*   Lipid Profile: Recent Labs    12/14/20 0535  CHOL 107  HDL 27*  LDLCALC 57  TRIG 114  106  CHOLHDL 4.0    Thyroid Function Tests: No results for input(s): TSH, T4TOTAL, FREET4, T3FREE, THYROIDAB in the last 72 hours. Anemia Panel: No results for input(s): VITAMINB12, FOLATE, FERRITIN, TIBC, IRON, RETICCTPCT in the last 72 hours. Sepsis Labs: No results for input(s): PROCALCITON, LATICACIDVEN in the last 168 hours.  No results found for this or any previous visit (from the past 240 hour(s)).     Imaging Studies   No results found.   Medications   Scheduled Meds:  amLODipine  10 mg Oral Daily   aspirin  81 mg Oral Daily   atorvastatin  40 mg Oral Daily   bisacodyl  10 mg Rectal QHS   chlorhexidine gluconate (MEDLINE KIT)   15 mL Mouth Rinse BID   Chlorhexidine Gluconate Cloth  6 each Topical Daily   enoxaparin (LOVENOX) injection  40 mg Subcutaneous Q24H   feeding supplement (PROSource TF)  45 mL Per Tube BID   insulin aspart  0-15 Units Subcutaneous Q4H   insulin aspart  4 Units Subcutaneous Q4H   irbesartan  75 mg Oral Daily   mouth rinse  15 mL Mouth Rinse q12n4p   metoprolol tartrate  25 mg Per Tube BID   sodium chloride flush  3 mL Intravenous Q12H   sodium chloride flush  3 mL Intravenous Q12H   Continuous Infusions:  sodium chloride Stopped (12/04/20 1802)   sodium chloride Stopped (11/30/20 0009)   feeding supplement (OSMOLITE 1.5 CAL) 1,000 mL (12/14/20 1154)       LOS: 16 days    Time spent: 25 minutes with > 50% spent at bedside and in coordination of care     Ezekiel Slocumb, DO [facility]  12/14/2020, 1:01 PM      If 7PM-7AM, please contact night-coverage. How to contact the [facility] Attending or Consulting provider [place] or covering provider during after hours [place], for this patient?    Check the care team in [facility] and look for a) attending/consulting [facility] provider listed and b) the [facility] team listed Log into [email or web address] and use [place]'s universal password to access. If you do not have the password, please contact the hospital operator. Locate the [facility] provider you are looking for under [facility] and page to a number that you can be directly reached. If you still have difficulty reaching the provider, please page the [facility] (Director on  Call) for the Hospitalists listed on amion for assistance.

## 2020-12-14 NOTE — Progress Notes (Addendum)
Progress Note  Patient Name: Albert Garrison Date of Encounter: 12/14/2020  [facility] Cardiologist: Carlyle Dolly, MD   Subjective   Sitting upright in bed. Calm. Oriented to self and place, not time. No complaints of chest pain, SOB, palpitations, or abdominal pain.   Inpatient Medications    Scheduled Meds:  amLODipine  10 mg Oral Daily   bisacodyl  10 mg Rectal QHS   chlorhexidine gluconate (MEDLINE KIT)  15 mL Mouth Rinse BID   Chlorhexidine Gluconate Cloth  6 each Topical Daily   enoxaparin (LOVENOX) injection  40 mg Subcutaneous Q24H   feeding supplement (PROSource TF)  45 mL Per Tube BID   insulin aspart  0-15 Units Subcutaneous Q4H   insulin aspart  4 Units Subcutaneous Q4H   mouth rinse  15 mL Mouth Rinse q12n4p   metoprolol tartrate  25 mg Per Tube BID   sodium chloride flush  3 mL Intravenous Q12H   sodium chloride flush  3 mL Intravenous Q12H   Continuous Infusions:  sodium chloride Stopped (12/04/20 1802)   sodium chloride Stopped (11/30/20 0009)   feeding supplement (OSMOLITE 1.5 CAL) 60 mL/hr at 12/11/20 1800   PRN Meds: sodium chloride, acetaminophen **OR** acetaminophen, bisacodyl, guaiFENesin-dextromethorphan, LORazepam, menthol-cetylpyridinium, ondansetron **OR** ondansetron (ZOFRAN) IV, sodium chloride flush   Vital Signs    Vitals:   12/13/20 1159 12/13/20 1950 12/14/20 0000 12/14/20 0400  BP: (!) 154/62 (!) 145/72 (!) 157/76 (!) 142/71  Pulse: 62 70 68   Resp: $Remo'20 18 19 17  'daUZl$ Temp: 98.2 F (36.8 C) 98.1 F (36.7 C) 97.6 F (36.4 C) 97.6 F (36.4 C)  TempSrc: Oral Oral Oral Oral  SpO2: 97% 93% 97%   Weight:    77.1 kg  Height:        Intake/Output Summary (Last 24 hours) at 12/14/2020 0802 Last data filed at 12/14/2020 0501 Gross per 24 hour  Intake 724.67 ml  Output 1550 ml  Net -825.33 ml   Last 3 Weights 12/14/2020 12/12/2020 12/11/2020  Weight (lbs) 169 lb 15.6 oz 177 lb 7.5 oz 193 lb 12.6 oz  Weight (kg) 77.1 kg 80.5 kg 87.9 kg       Telemetry    Sinus rhythm, PVCs, 10 beat run of NSVT overnight - Personally Reviewed  ECG    No new tracings - Personally Reviewed  Physical Exam    GEN: No acute distress.   Neck: No JVD Cardiac: RRR, no murmurs, rubs, or gallops.  Respiratory: Clear to auscultation bilaterally. GI: Soft, nontender, non-distended  MS: No edema; No deformity. Neuro:  A&O x2, moving all extremities  Psych: Calm, wrist restraints in place  Labs    High Sensitivity Troponin:   Recent Labs  Lab 11/29/20 2243 11/30/20 0224 11/30/20 0503 11/30/20 0730  TROPONINIHS 116* 263* 306* 285*      Chemistry Recent Labs  Lab 12/09/20 0150 12/10/20 0446 12/14/20 0535  NA 131* 134* 139  K 4.3 4.2 4.2  CL 100 103 105  CO2 $Re'24 24 26  'SEJ$ GLUCOSE 262* 240* 155*  BUN 19 22 24*  CREATININE 1.18 1.10 1.14  CALCIUM 8.3* 8.1* 8.8*  PROT  --  5.7* 6.6  ALBUMIN  --  2.4* 2.7*  AST  --  19 22  ALT  --  22 22  ALKPHOS  --  50 60  BILITOT  --  0.5 0.6  GFRNONAA >60 >60 >60  ANIONGAP $RemoveB'7 7 8     'EBVPrCNt$ Hematology Recent Labs  Lab 12/10/20 0446 12/11/20 0401 12/13/20 0156  WBC 15.7* 11.7* 8.9  RBC 4.01* 3.80* 4.03*  HGB 12.1* 11.4* 12.0*  HCT 35.8* 34.7* 36.7*  MCV 89.3 91.3 91.1  MCH 30.2 30.0 29.8  MCHC 33.8 32.9 32.7  RDW 13.7 13.8 13.5  PLT 460* 485* 547*    BNPNo results for input(s): BNP, PROBNP in the last 168 hours.   DDimer No results for input(s): DDIMER in the last 168 hours.   Radiology    No results found.  Cardiac Studies   Limited echo 11/30/20: 1. Left ventricular ejection fraction, by estimation, is 65 to 70%. The  left ventricle has normal function. The left ventricle has no regional  wall motion abnormalities. There is mild left ventricular hypertrophy.  Left ventricular diastolic parameters  are indeterminate.   2. Right ventricular systolic function is normal. The right ventricular  size is normal. Tricuspid regurgitation signal is inadequate for assessing  PA  pressure.   3. The mitral valve is grossly normal.   4. Aortic valve regurgitation is mild.   5. Aortic dilatation noted. There is mild dilatation of the aortic root,  measuring 42 mm.   Comparison(s): Prior images reviewed side by side. Limited study. LVEF  remains normal range.   Echocardiogram 11/28/20: 1. Left ventricular ejection fraction, by estimation, is 55 to 60%. The  left ventricle has normal function. The left ventricle has no regional  wall motion abnormalities. There is moderate concentric left ventricular  hypertrophy. Left ventricular  diastolic parameters are consistent with Grade I diastolic dysfunction  (impaired relaxation).   2. Right ventricular systolic function is normal. The right ventricular  size is normal. Tricuspid regurgitation signal is inadequate for assessing  PA pressure.   3. The mitral valve is normal in structure. Trivial mitral valve  regurgitation. No evidence of mitral stenosis.   4. The aortic valve is tricuspid. Aortic valve regurgitation is mild.   5. Aortic dilatation noted. There is mild dilatation of the aortic root,  measuring 43 mm.   Patient Profile     75 y.o. male with a PMH of HTN, HLD, DM type 2, CVA in 2019, NSVT, OSA not on CPAP, and CKD stage 2 who is admitted with SBO, hyponatremia, and acute encephalopathy. During admission he developed NSVT then more sustained runs, progressive IVCD by EKG, then reportedly  bradycardia and unresponsiveness. He had compressions for under a minute with ROSC, no shocks delivered. He was intubated for hypoxic respiratory failure and required pressor support. He was transferred to [facility] for further management. Hospital course also complicated by dysphagia (requiring [employer]), persistent confusion, AKI on CKD stage II, fever (felt possibly due to aspiration during arrest s/p Unasyn). Troponin elevated to 306.   Assessment & Plan    1. Cardiac arrest with NSVT followed by bradycardia: he had brief CPR  with ROSC in 1 minute; no shocks. Was given atropine and epinephrine. He was intubated and pressors initiated. He was started on amiodarone>metoprolol for rhythm control. Echo post-arrest showed EF 65-70%, mild LVH, mild AI, mildly dilated aortic root. Planning for cardiac cath however patient remained confused. Medical therapy recommended. He completed 48 hours of IV heparin - Would add aspirin $RemoveBef'81mg'DcuUfaOEvl$  daily  - Will restart home statin - Continue Bblocker - Ischemic evaluation pending improvement in mental status  2. NSVT: brief run of NSVT (10 beats) overnight on telemetry. Patient asymptomatic - Continue Bblocker  3. HTN: BP persistently above goal. Home telmisartan remains on hold; Cr stable -  Will restart ARB - telmisartan not on formulary, will start irbesartan $RemoveBeforeDE'75mg'hseQorOVfBatpBT$  daily - Continue amlodipine and metoprolol  4. HLD: on atorvastatin prior to admission - Will check FLP for risk stratification - Will restart home atorvastatin  5. DM type 2: A1C 8.6 11/2020; on metformin at home - Continue ISS  Remainder of care per primary team: - SBO - Dysphagia - Leukocytosis - Encephalopathy - Hematuria       For questions or updates, please contact [place] Please consult [email or web address] for contact info under        Signed, Abigail Butts, PA-C  12/14/2020, 8:02 AM    Patient examined chart reviewed. MS still off EF remains normal by TTE Telemetry with only 10 beat run NSVT. Considering cath latter in week pending improvement in MS. On statin, beta blocker ASA 48 hours heparin done  SBO, Cortrak in place   Jenkins Rouge MD [facility]

## 2020-12-15 DIAGNOSIS — G9340 Encephalopathy, unspecified: Secondary | ICD-10-CM | POA: Diagnosis not present

## 2020-12-15 DIAGNOSIS — K56609 Unspecified intestinal obstruction, unspecified as to partial versus complete obstruction: Secondary | ICD-10-CM | POA: Diagnosis not present

## 2020-12-15 DIAGNOSIS — I469 Cardiac arrest, cause unspecified: Secondary | ICD-10-CM | POA: Diagnosis not present

## 2020-12-15 LAB — GLUCOSE, CAPILLARY
Glucose-Capillary: 137 mg/dL — ABNORMAL HIGH (ref 70–99)
Glucose-Capillary: 184 mg/dL — ABNORMAL HIGH (ref 70–99)
Glucose-Capillary: 191 mg/dL — ABNORMAL HIGH (ref 70–99)
Glucose-Capillary: 196 mg/dL — ABNORMAL HIGH (ref 70–99)
Glucose-Capillary: 219 mg/dL — ABNORMAL HIGH (ref 70–99)
Glucose-Capillary: 308 mg/dL — ABNORMAL HIGH (ref 70–99)

## 2020-12-15 MED ORDER — SODIUM BICARBONATE 650 MG PO TABS
650.0000 mg | ORAL_TABLET | Freq: Once | ORAL | Status: AC
  Administered 2020-12-15: 650 mg
  Filled 2020-12-15: qty 1

## 2020-12-15 MED ORDER — PANCRELIPASE (LIP-PROT-AMYL) 10440-39150 UNITS PO TABS
20880.0000 [IU] | ORAL_TABLET | Freq: Once | ORAL | Status: AC
  Administered 2020-12-15: 20880 [IU]
  Filled 2020-12-15: qty 2

## 2020-12-15 MED FILL — Midazolam HCl Inj 5 MG/5ML (Base Equivalent): INTRAMUSCULAR | Qty: 5 | Status: AC

## 2020-12-15 NOTE — Progress Notes (Signed)
As of midnight, patient with no urine output on this shift through Foley catheter.  Flushing catheter with sterile water with no additional output.  Foley catheter, inserted further and urine began to flow.  Peri and foley care completed prior to adjustment.

## 2020-12-15 NOTE — Progress Notes (Signed)
Ot treatment Pt. Was seen for OT to increase I with ADLs and mobility. Pt. Was confused but cooperative. Pt. Was restrained and had taken one mitten off. Pt. Restraints were unsecure and other mitten taken off for therapy and was reapplied post session. Pt. Requires extra time to follow directions. Pt. Does have decreased balance in standing and had posterior lean. Pt. Was able to preform adls sitting eob without lob. Acute OT to follow.    12/15/20 1300  OT Visit Information  Last OT Received On 12/15/20  Assistance Needed +1  History of Present Illness 75 y.o. male presenting to [facility] ED 8/29 with diarrhea, decreased p.o. intake and nausea. Transferred to [facility]. Patient admitted with acute encephalopathy and symptomatic hyponatremia. Abdominal distension 8/27. CT abd (+) SBO and NGT placed. Hosptial admission complicated by continued impulsivity and agitation with patient pulling out multiple NGT's despite restraints. 8/28 wide QRS with frequent consecutive PVCs and LBBB. Bradycardic and hypotensive followed by loss of pulse. Code blue called. ROSC achieved after several mins of CPR. Troponin peaked at 309. Concern for possible STEMI with cardiac cath scheduled 12/08/20.  PRVC 8/28-8/30. Cardiology following. PMHx significant for CKD, BPH, DMII, Hx of CVA, HTN, and GERD.  Precautions  Precautions Fall  Precaution Comments Monitor BP  Pain Assessment  Pain Assessment No/denies pain  Cognition  Arousal/Alertness Awake/alert  Behavior During Therapy Impulsive  Overall Cognitive Status Impaired/Different from baseline  Area of Impairment Orientation;Attention;Following commands;Safety/judgement;Awareness;Problem solving;Memory  Orientation Level Place;Time;Situation;Disoriented to  Memory Decreased short-term memory;Decreased recall of precautions  Following Commands Follows one step commands inconsistently  Safety/Judgement Decreased awareness of deficits;Decreased awareness of safety  Problem  Solving Slow processing;Decreased initiation;Difficulty sequencing;Requires verbal cues;Requires tactile cues  General Comments Pt. is confused but pleasant  Upper Extremity Assessment  Upper Extremity Assessment Overall WFL for tasks assessed  Lower Extremity Assessment  Lower Extremity Assessment Defer to PT evaluation  ADL  Overall ADL's  Needs assistance/impaired  Eating/Feeding Minimal assistance;Sitting  Grooming Wash/dry hands;Sitting;Minimal assistance  Upper Body Bathing Minimal assistance;Sitting  Lower Body Bathing Maximal assistance;Sit to/from stand  Upper Body Dressing  Sitting;Moderate assistance  Lower Body Dressing Moderate assistance;Sit to/from Retail buyer Moderate assistance;Stand-pivot;BSC;RW  Toileting- Clothing Manipulation and Hygiene Total assistance;Sit to/from stand  Functional mobility during ADLs Moderate assistance (mod a with stand pivot transfer)  General ADL Comments Pt. has diffiuculty following directions and needs extra processing time.  Bed Mobility  Overal bed mobility Needs Assistance  Bed Mobility Sit to Supine  Supine to sit Min guard;HOB elevated  Sit to supine Min guard;HOB elevated  Balance  Sitting balance-Leahy Scale Fair  Standing balance-Leahy Scale Poor  Restrictions  Weight Bearing Restrictions No  Vision- Assessment  Vision Assessment? No apparent visual deficits  Transfers  Overall transfer level Needs assistance  Equipment used Rolling walker (2 wheeled)  Transfers Sit to/from Stand  Sit to Stand Min assist  Stand pivot transfers Mod assist  General transfer comment cues to move feet  OT - End of Session  Equipment Utilized During Treatment Gait belt;Rolling walker  Activity Tolerance Patient tolerated treatment well  Patient left in bed;with call bell/phone within reach;with bed alarm set  Nurse Communication  (ok therapy)  OT Assessment/Plan  OT Plan Discharge plan remains appropriate  OT Visit Diagnosis  Unsteadiness on feet (R26.81);Muscle weakness (generalized) (M62.81);Other symptoms and signs involving cognitive function  OT Frequency (ACUTE ONLY) Min 2X/week  Follow Up Recommendations SNF;Supervision/Assistance - 24 hour  AM-PAC OT "6 Clicks" Daily  Activity Outcome Measure (Version 2)  Help from another person eating meals? 3  Help from another person taking care of personal grooming? 2  Help from another person toileting, which includes using toliet, bedpan, or urinal? 1  Help from another person bathing (including washing, rinsing, drying)? 2  Help from another person to put on and taking off regular upper body clothing? 3  Help from another person to put on and taking off regular lower body clothing? 2  6 Click Score 13  Progressive Mobility  What is the highest level of mobility based on the progressive mobility assessment? Level 3 (Stands with assist) - Balance while standing  and cannot march in place  Mobility Out of bed to chair with meals  OT Goal Progression  Progress towards OT goals Progressing toward goals  Acute Rehab OT Goals  Patient Stated Goal none stated  OT Goal Formulation Patient unable to participate in goal setting  Time For Goal Achievement 12/16/20  Potential to Achieve Goals Fair  ADL Goals  Pt Will Perform Grooming with modified independence;standing  Pt Will Perform Upper Body Dressing Independently  Pt Will Perform Lower Body Dressing with modified independence;sit to/from stand  Pt Will Transfer to Toilet with modified independence;ambulating  Pt Will Perform Toileting - Clothing Manipulation and hygiene with modified independence;sit to/from stand  Additional ADL Goal #1 Patient will follow 1-2 step verbal commands with 95% accuracy in prep for ADLs.  Additional ADL Goal #2 Patient will score <4/28 on SBT indicating increased cognition in prep for safe return to PLOF.  OT Time Calculation  OT Start Time (ACUTE ONLY) 1330  OT Stop Time (ACUTE ONLY)  1408  OT Time Calculation (min) 38 min  OT General Charges  $OT Visit 1 Visit  OT Treatments  $Self Care/Home Management  38-52 mins  Reece Packer OT/L

## 2020-12-15 NOTE — Progress Notes (Signed)
Patient cotrak clogged overnight.  Attempts at unclogging with flushes unsuccessful.  Protocol initiated for standing orders to unclog with Viokace and sodium bicarbonate.  Cortrak unclogged and flushing well.

## 2020-12-15 NOTE — Progress Notes (Signed)
PROGRESS NOTE    Albert Garrison   OVF:[id number]  DOB: [birth year]  PCP: Celene Squibb, MD    DOA: 11/27/2020 LOS: 50    Brief Narrative / Hospital Course to Date:   75 year old male with history of hypertension, hyperlipidemia, former smoker, DM2, history of lower GI bleed, chronic kidney disease stage II who initially presented to [facility] with complaints of nausea, poor p.o. intake and decreased appetite.  Admitted on 8/26.  Patient was hyponatremic with sodium 118 with AKI and leukocytosis.  Abdominal x-ray on hospital day 2 showed a small bowel obstruction and general surgery was consulted.  CT scan abdomen pelvis showed a transition point in the right mid abdomen.    Hospital course complicated by increased ectopy on telemetry and ultimately patient had a cardiac arrest late evening of 8/28.  He became unresponsible and seen to have agonal respirations.  CODE BLUE was called and ROSC was achieved in about 5 minutes.  Due to concern for new onset left bundle branch block/STEMI, patient was started on heparin and amiodarone.  Cardiology was consulted and patient was transferred to [facility].  Significant events 8/26 Admit to [facility] 8/26 N/V 8/27 KUB with concern for distal SBO. ECHO with LVEF 55-60%, no RWMA, grade I diastolic dysfunction 8/41 CT ABD with small bowel obstruction with likely transition point in the right mid abdomen.  CT Head negative.  8/28 cardiac arrest >>NSVT with somewhat more sustained runs, progressive IVCD by ECG, reportedly bradycardia and unresponsiveness.  He was treated with atropine and epinephrine, had compressions under a minute with ROSC, no shocks delivered.  He was intubated and did require initiation of pressors including Levophed and vasopressin, also started on amiodarone 8/29 Transfer to [facility] for surgical / cardiac ongoing evaluation 8/29 Echo LVEF 65-70%, otherwise grossly normal.  8/30 extubated  9/1-TRH assumed care   Pt remains  on tube feeds by CorTrack.  Family previously declined PEG, but want full code.  Long term nutrition needs to be addressed for discharge from hospital to SNF.  Assessment & Plan   Principal Problem:   SBO (small bowel obstruction) (HCC) Active Problems:   Hyponatremia   Acute encephalopathy   Type 2 diabetes mellitus ([place])   Acute hypoxemic respiratory failure (HCC)   Shock circulatory (HCC)   Cardiac arrest (HCC)   Other specified cardiac arrhythmias--- sleep apnea- arrhythmias when he desaturates    Endotracheally intubated   NSVT, bradycardic arrest, concern for LBBB/STEMI -  Status post CODE BLUE cardiac arrest requiring resuscitation. Cardiology following, follow-up recommendations. Initially on amiodarone, has transitioned to metoprolol. Treated with IV heparin for few days, discontinued. Lovenox for VTE prophylaxis Plans for cardiac cath on hold given his persistent encephalopathy, procedure would be very high risk with patient's mental status and inability to cooperate.    Small bowel obstruction -resolved.  Present on admission with CT showing transition point in the right mid abdomen.  General surgery was consulted.  Obstruction improved with conservative management however patient failed swallow evaluation due to altered mental status.  Core track in place and on tube feeds for nutrition.  Dysphagia -SLP following, appreciate recommendations.   Continue with core track and tube feeds per RD. On dysphagia 2 diet with honey thick liquids. --Pt eating minimally by mouth Early GOC discussions - family/POA declined PEG tube. 9/13 -siblings discussing PEG and will give [place] a decision soon  Leukocytosis -resolved.   WBC up on 9/7-8 at 14.1 >> 15.7, since  down trended.   Pt afebrile, but has silent aspiration so concern for pneumonitis/pneumonia.  Intermittently has wet sounding cough. Monitor closely.   Acute metabolic encephalopathy -MRI brain was obtained on 9/3 due to  persistent confusion, it showed no evidence for anoxic brain injury.  Mental status slowly improving but pt remains confused, impulsive, poor insight.  Requiring mittens to prevent pulling tubes. --Delirium precautions:     -Lights and TV off, minimize interruptions at night    -Blinds open and lights on during day    -Glasses/hearing aid with patient    -Frequent reorientation    -PT/OT when able    -Avoid sedation medications/Beers list medications  AKI superimposed on CKD stage II -renal function near baseline (creatinine approximately 1.2) -- Monitor renal function  Fever -resolved.  Thought due to aspiration during his cardiac arrest event.  He completed 5 days of Unasyn.  Monitor clinically off antibiotics.  Symptomatic hyponatremia -improved and sodium level improving.  Continue to monitor BMP.  Acute respiratory failure with hypoxia in the setting of cardiac arrest -resolved.  Required intubation and was extubated on 8/30.  Monitor O2 sat and supplement to maintain sats above 90%, wean as tolerated  Type 2 diabetes -uncontrolled, A1c 8.6%.  Continue sliding scale NovoLog.  Hypertension -continue metoprolol  Hematuria - onset reported by RN on 9/7 due to pt pulling at Foley.  Foley was replaced with 20 Fr on 9/8.  Some blood noted at urethral meatus after replacement likely urethral injury.   --Monitor   Patient BMI: Body mass index is 24.08 kg/m.   DVT prophylaxis: enoxaparin (LOVENOX) injection 40 mg Start: 12/03/20 1600 SCDs Start: 11/27/20 1854 Place TED hose Start: 11/27/20 1854   Diet:  Diet Orders (From admission, onward)     Start     Ordered   12/14/20 1243  DIET DYS 2 Room service appropriate? Yes; Fluid consistency: Thin  Diet effective now       Question Answer Comment  Room service appropriate? Yes   Fluid consistency: Thin      12/14/20 1242              Code Status: Full Code   Subjective 12/15/20    Patient awake sitting up in bed when seen  this morning.   Patient says he is feeling fine, asking for some water to drink. His Foley had to be replaced overnight.  Core track clogged overnight but was able to be resolved and functioning today without issues.  Seen later in the morning with sister visiting at bedside.  He knows who she is and carries on conversation although confused at times.  He can name the rest of his siblings also.  Patient siblings are discussing PEG tube placement.   Disposition Plan & Communication   Status is: Inpatient  Remains inpatient appropriate because:Inpatient level of care appropriate due to severity of illness.  [place] discussions ongoing.  Requiring NG tube feeds.  Needs PEG, family deciding.  Requires SNF placement.  Dispo: The patient is from: Home              Anticipated d/c is to: SNF              Patient currently is not medically stable to d/c.   Difficult to place patient No  Family communication - sister at bedside on rounds today. Spoke with brother/HPOA Harrison Mons today regarding PEG tube for nutrition, as pt cannot get d/c'd to rehab with CorTrak, and is  still aspirating, not able to get sufficient calories by mouth.   [name] will discuss with the rest of their siblings and let us know the decision.  Consults, Procedures, Significant Events   Consultants:  PCCM General surgery Cardiology Palliative care  Procedures:  2D echo:  1. Left ventricular ejection fraction, by estimation, is 65 to 70%. The left ventricle has normal function. The left ventricle has no regional wall motion abnormalities. There is mild left ventricular hypertrophy. Left ventricular diastolic parameters are indeterminate.   2. Right ventricular systolic function is normal. The right ventricular size is normal. Tricuspid regurgitation signal is inadequate for assessing PA pressure.   3. The mitral valve is grossly normal.   4. Aortic valve regurgitation is mild.   5. Aortic dilatation noted. There is mild  dilatation of the aortic root, measuring 42 mm.  Antimicrobials:  Anti-infectives (From admission, onward)    Start     Dose/Rate Route Frequency Ordered Stop   12/02/20 1511  ampicillin-sulbactam (UNASYN) 1.5 g in sodium chloride 0.9 % 100 mL IVPB        1.5 g 200 mL/hr over 30 Minutes Intravenous Every 6 hours 12/02/20 1324 12/05/20 0903   12/01/20 1630  ampicillin-sulbactam (UNASYN) 1.5 g in sodium chloride 0.9 % 100 mL IVPB  Status:  Discontinued        1.5 g 200 mL/hr over 30 Minutes Intravenous Every 6 hours 12/01/20 1531 12/01/20 1534   12/01/20 1630  ampicillin-sulbactam (UNASYN) 1.5 g in sodium chloride 0.9 % 100 mL IVPB  Status:  Discontinued        1.5 g 200 mL/hr over 30 Minutes Intravenous Every 8 hours 12/01/20 1534 12/02/20 1324   11/30/20 1000  vancomycin (VANCOREADY) IVPB 750 mg/150 mL  Status:  Discontinued        750 mg 150 mL/hr over 60 Minutes Intravenous Every 12 hours 11/30/20 0043 12/01/20 0937   11/30/20 0130  vancomycin (VANCOREADY) IVPB 1500 mg/300 mL        1,500 mg 150 mL/hr over 120 Minutes Intravenous  Once 11/30/20 0043 11/30/20 0511   11/30/20 0130  ceFEPIme (MAXIPIME) 2 g in sodium chloride 0.9 % 100 mL IVPB  Status:  Discontinued        2 g 200 mL/hr over 30 Minutes Intravenous Every 12 hours 11/30/20 0043 12/01/20 0937         Micro    Objective   Vitals:   12/15/20 0400 12/15/20 0855 12/15/20 0956 12/15/20 1200  BP: 128/72 (!) 143/71 123/68 122/74  Pulse:  75    Resp: _0 (!) 21  Temp: 97.9 F (36.6 C) 97.8 F (36.6 C)    TempSrc: Oral Oral    SpO2: 96% 94%    Weight: 78.3 kg     Height:        Intake/Output Summary (Last 24 hours) at 12/15/2020 1357 Last data filed at 12/15/2020 0412 Gross per 24 hour  Intake 1257 ml  Output 1100 ml  Net 157 ml   Filed Weights   12/12/20 0435 12/14/20 0400 12/15/20 0400  Weight: 80.5 kg 77.1 kg 78.3 kg    Physical Exam:  General exam: awake, alert, no acute distress HEENT: CorTrak  in place with continuous tube feeds running Respiratory system: CTAB, no wheezes or rhonchi, normal respiratory effort, on room air.  Noted no cough during exam today. Cardiovascular system: normal S1/S2, RRR, no pedal edema.   Gastrointestinal system: soft, nontender and nondistended Central nervous system:  oriented to self and hospital but not time town or situation, no gross focal neurologic deficits, normal speech Extremities: Mittens on bilateral hands, no peripheral edema Psychiatry: normal mood, congruent affect, abnormal judgment and insight due to confusion  Labs   Data Reviewed: I have personally reviewed following labs and imaging studies  CBC: Recent Labs  Lab 12/09/20 0150 12/10/20 0446 12/11/20 0401 12/13/20 0156  WBC 14.1* 15.7* 11.7* 8.9  HGB 12.7* 12.1* 11.4* 12.0*  HCT 38.1* 35.8* 34.7* 36.7*  MCV 89.9 89.3 91.3 91.1  PLT 500* 460* 485* 032*   Basic Metabolic Panel: Recent Labs  Lab 12/09/20 0150 12/10/20 0446 12/14/20 0535  NA 131* 134* 139  K 4.3 4.2 4.2  CL 100 103 105  CO2 _0 GLUCOSE 262* 240* 155*  BUN 19 22 24*  CREATININE 1.18 1.10 1.14  CALCIUM 8.3* 8.1* 8.8*  MG  --  2.0 2.1  PHOS  --  2.8 3.7   GFR: Estimated Creatinine Clearance: 58.7 mL/min (by C-G formula based on SCr of 1.14 mg/dL). Liver Function Tests: Recent Labs  Lab 12/10/20 0446 12/14/20 0535  AST 19 22  ALT 22 22  ALKPHOS 50 59  BILITOT 0.5 0.6  PROT 5.7* 6.6  ALBUMIN 2.4* 2.7*   No results for input(s): LIPASE, AMYLASE in the last 168 hours. No results for input(s): AMMONIA in the last 168 hours. Coagulation Profile: No results for input(s): INR, PROTIME in the last 168 hours. Cardiac Enzymes: No results for input(s): CKTOTAL, CKMB, CKMBINDEX, TROPONINI in the last 168 hours. BNP (last 3 results) No results for input(s): PROBNP in the last 8760 hours. HbA1C: No results for input(s): HGBA1C in the last 72 hours. CBG: Recent Labs  Lab 12/14/20 2000  12/15/20 0008 12/15/20 0404 12/15/20 0724 12/15/20 1109  GLUCAP 206* 184* 196* 191* 219*   Lipid Profile: Recent Labs    12/14/20 0535  CHOL 107  HDL 27*  LDLCALC 57  TRIG 114  106  CHOLHDL 4.0    Thyroid Function Tests: No results for input(s): TSH, T4TOTAL, FREET4, T3FREE, THYROIDAB in the last 72 hours. Anemia Panel: No results for input(s): VITAMINB12, FOLATE, FERRITIN, TIBC, IRON, RETICCTPCT in the last 72 hours. Sepsis Labs: No results for input(s): PROCALCITON, LATICACIDVEN in the last 168 hours.  No results found for this or any previous visit (from the past 240 hour(s)).     Imaging Studies   No results found.   Medications   Scheduled Meds:  amLODipine  10 mg Oral Daily   aspirin  81 mg Oral Daily   atorvastatin  40 mg Oral Daily   bisacodyl  10 mg Rectal QHS   chlorhexidine gluconate (MEDLINE KIT)  15 mL Mouth Rinse BID   Chlorhexidine Gluconate Cloth  6 each Topical Daily   enoxaparin (LOVENOX) injection  40 mg Subcutaneous Q24H   feeding supplement (PROSource TF)  45 mL Per Tube BID   insulin aspart  0-15 Units Subcutaneous Q4H   insulin aspart  4 Units Subcutaneous Q4H   irbesartan  75 mg Oral Daily   mouth rinse  15 mL Mouth Rinse q12n4p   metoprolol tartrate  25 mg Per Tube BID   sodium chloride flush  3 mL Intravenous Q12H   Continuous Infusions:  sodium chloride Stopped (11/30/20 0009)   feeding supplement (OSMOLITE 1.5 CAL) 1,000 mL (12/14/20 1154)       LOS: 17 days    Time spent: 30 minutes with > 50% spent  at bedside and in coordination of care     Ezekiel Slocumb, DO [facility]  12/15/2020, 1:57 PM      If 7PM-7AM, please contact night-coverage. How to contact the [facility] Attending or Consulting provider [place] or covering provider during after hours [place], for this patient?    Check the care team in [place] and look for a) attending/consulting [facility] provider listed and b) the [facility] team listed Log into [email or web address]  and use [place]'s universal password to access. If you do not have the password, please contact the hospital operator. Locate the [facility] provider you are looking for under [facility] and page to a number that you can be directly reached. If you still have difficulty reaching the provider, please page the [facility] (Director on Call) for the Hospitalists listed on amion for assistance.

## 2020-12-15 NOTE — Progress Notes (Signed)
Patient son, Albert Garrison would like someone from the hospitalist team to call with an update.  Phone number is [phone].  Per son, call twice if he does not answer on the first try as he may not hear it due to work.

## 2020-12-15 NOTE — Progress Notes (Signed)
Physical Therapy Treatment Patient Details Name: Albert Garrison MRN: [id number] DOB: 1945/05/14 Today's Date: 12/15/2020   History of Present Illness 75 y.o. male presenting to [facility] ED 8/29 with diarrhea, decreased p.o. intake and nausea. Transferred to [facility] Patient admitted with acute encephalopathy and symptomatic hyponatremia. Abdominal distension 8/27. CT abd (+) SBO and NGT placed. Hosptial admission complicated by continued impulsivity and agitation with patient pulling out multiple NGT's despite restraints. 8/28 wide QRS with frequent consecutive PVCs and LBBB. Bradycardic and hypotensive followed by loss of pulse. Code blue called. ROSC achieved after several mins of CPR. Troponin peaked at 309. Concern for possible STEMI with cardiac cath scheduled 12/08/20.  PRVC 8/28-8/30. Cardiology following. PMHx significant for CKD, BPH, DMII, Hx of CVA, HTN, and GERD.    PT Comments    Pt making slow, steady progress with mobility. Will continue to work toward progressing ambulation and improving balance.    Recommendations for follow up therapy are one component of a multi-disciplinary discharge planning process, led by the attending physician.  Recommendations may be updated based on patient status, additional functional criteria and insurance authorization.  Follow Up Recommendations  SNF     Equipment Recommendations  Rolling walker with 5" wheels    Recommendations for Other Services       Precautions / Restrictions Precautions Precautions: Fall Precaution Comments: Monitor BP     Mobility  Bed Mobility Overal bed mobility: Needs Assistance Bed Mobility: Supine to Sit;Sit to Supine     Supine to sit: Min guard;HOB elevated Sit to supine: Min guard;HOB elevated   General bed mobility comments: Assist for safety and verbal cuing for technique    Transfers Overall transfer level: Needs assistance Equipment used: Rolling walker (2 wheeled) Transfers: Sit to/from Stand Sit  to Stand: Min assist         General transfer comment: Assist to bring hips up and for balance.  Ambulation/Gait Ambulation/Gait assistance: Min assist Gait Distance (Feet): 60 Feet Assistive device: Rolling walker (2 wheeled) Gait Pattern/deviations: Step-through pattern;Decreased step length - right;Decreased step length - left;Shuffle;Trunk flexed;Narrow base of support Gait velocity: decreased Gait velocity interpretation: <1.31 ft/sec, indicative of household ambulator General Gait Details: Assist for balance. Verbal/tactile cues to stay closer to walker when turning.   Stairs             Wheelchair Mobility    Modified Rankin (Stroke Patients Only)       Balance Overall balance assessment: Needs assistance Sitting-balance support: No upper extremity supported Sitting balance-Leahy Scale: Fair     Standing balance support: Bilateral upper extremity supported Standing balance-Leahy Scale: Poor Standing balance comment: walker and min guard for static standing                            Cognition Arousal/Alertness: Awake/alert Behavior During Therapy: Impulsive Overall Cognitive Status: Impaired/Different from baseline Area of Impairment: Orientation;Attention;Following commands;Safety/judgement;Awareness;Problem solving;Memory                 Orientation Level: Place;Time;Situation;Disoriented to Current Attention Level: Sustained Memory: Decreased short-term memory;Decreased recall of precautions Following Commands: Follows one step commands inconsistently Safety/Judgement: Decreased awareness of deficits;Decreased awareness of safety Awareness: Intellectual Problem Solving: Slow processing;Decreased initiation;Difficulty sequencing;Requires verbal cues;Requires tactile cues General Comments: Pt. is confused but pleasant      Exercises      General Comments        Pertinent Vitals/Pain Pain Assessment: No/denies pain  Home  Living                      Prior Function            PT Goals (current goals can now be found in the care plan section) Progress towards PT goals: Progressing toward goals    Frequency    Min 2X/week      PT Plan Current plan remains appropriate    Co-evaluation              AM-PAC PT "6 Clicks" Mobility   Outcome Measure  Help needed turning from your back to your side while in a flat bed without using bedrails?: None Help needed moving from lying on your back to sitting on the side of a flat bed without using bedrails?: A Little Help needed moving to and from a bed to a chair (including a wheelchair)?: A Little Help needed standing up from a chair using your arms (e.g., wheelchair or bedside chair)?: A Little Help needed to walk in hospital room?: A Little Help needed climbing 3-5 steps with a railing? : Total 6 Click Score: 17    End of Session Equipment Utilized During Treatment: Gait belt Activity Tolerance: Patient tolerated treatment well Patient left: in bed;with call bell/phone within reach;with bed alarm set Nurse Communication: Mobility status;Other (comment) (pt with lunch present in room and supervision required) PT Visit Diagnosis: Unsteadiness on feet (R26.81);Other abnormalities of gait and mobility (R26.89);Muscle weakness (generalized) (M62.81);Difficulty in walking, not elsewhere classified (R26.2)     Time: [id number] PT Time Calculation (min) (ACUTE ONLY): 31 min  Charges:  $Gait Training: 23-37 mins                     [place] Pager [phone] Office [place] 12/15/2020, 1:50 PM

## 2020-12-15 NOTE — Progress Notes (Signed)
Progress Note  Patient Name: Albert Garrison Date of Encounter: 12/15/2020  [facility] Cardiologist: Carlyle Dolly, MD   Subjective   75 year old gentleman with a history of CVA, nonsustained ventricular tachycardia, diabetes mellitus was seen by Dr. Domenic Polite on August 29 following a cardiac arrest.  We have been following him since that time.  He has been confused and has not been a candidate for heart catheterization up to this point.  He is still fairly confused. Alert, oriented to person.  He did not know where he was.  We did talk about some Towns in [place].  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  Inpatient Medications    Scheduled Meds:  amLODipine  10 mg Oral Daily   aspirin  81 mg Oral Daily   atorvastatin  40 mg Oral Daily   bisacodyl  10 mg Rectal QHS   chlorhexidine gluconate (MEDLINE KIT)  15 mL Mouth Rinse BID   Chlorhexidine Gluconate Cloth  6 each Topical Daily   enoxaparin (LOVENOX) injection  40 mg Subcutaneous Q24H   feeding supplement (PROSource TF)  45 mL Per Tube BID   insulin aspart  0-15 Units Subcutaneous Q4H   insulin aspart  4 Units Subcutaneous Q4H   irbesartan  75 mg Oral Daily   mouth rinse  15 mL Mouth Rinse q12n4p   metoprolol tartrate  25 mg Per Tube BID   sodium chloride flush  3 mL Intravenous Q12H   Continuous Infusions:  sodium chloride Stopped (11/30/20 0009)   feeding supplement (OSMOLITE 1.5 CAL) 1,000 mL (12/14/20 1154)   PRN Meds: acetaminophen **OR** acetaminophen, bisacodyl, guaiFENesin-dextromethorphan, LORazepam, menthol-cetylpyridinium, ondansetron **OR** ondansetron (ZOFRAN) IV   Vital Signs    Vitals:   12/15/20 0000 12/15/20 0400 12/15/20 0855 12/15/20 0956  BP: 126/77 128/72 (!) 143/71 123/68  Pulse: 77  75   Resp: _0 Temp: 98.4 F (36.9 C) 97.9 F (36.6 C) 97.8 F (36.6 C)   TempSrc: Oral Oral Oral   SpO2: 95% 96% 94%   Weight:  78.3 kg    Height:        Intake/Output Summary (Last 24  hours) at 12/15/2020 1128 Last data filed at 12/15/2020 0412 Gross per 24 hour  Intake 1257 ml  Output 1100 ml  Net 157 ml   Last 3 Weights 12/15/2020 12/14/2020 12/12/2020  Weight (lbs) 172 lb 9.9 oz 169 lb 15.6 oz 177 lb 7.5 oz  Weight (kg) 78.3 kg 77.1 kg 80.5 kg      Telemetry     NSR with PVC s- Personally Reviewed  ECG     - Personally Reviewed  Physical Exam   GEN: No acute distress.  Pleasant , oriented to person  Neck: No JVD Cardiac: RRR, no murmurs, rubs, or gallops.  Respiratory: Clear to auscultation bilaterally. GI: Soft, nontender, non-distended  MS: No edema; No deformity. Neuro:  Nonfocal  Psych: Normal affect   Labs    High Sensitivity Troponin:   Recent Labs  Lab 11/29/20 2243 11/30/20 0224 11/30/20 0503 11/30/20 0730  TROPONINIHS 116* 263* 306* 285*      Chemistry Recent Labs  Lab 12/09/20 0150 12/10/20 0446 12/14/20 0535  NA 131* 134* 139  K 4.3 4.2 4.2  CL 100 103 105  CO2 _1 GLUCOSE 262* 240* 155*  BUN 19 22 24*  CREATININE 1.18 1.10 1.14  CALCIUM 8.3* 8.1* 8.8*  PROT  --  5.7* 6.6  ALBUMIN  --  2.4* 2.7*  AST  --  19 22  ALT  --  22 22  ALKPHOS  --  50 59  BILITOT  --  0.5 0.6  GFRNONAA >60 >60 >60  ANIONGAP _0 Hematology Recent Labs  Lab 12/10/20 0446 12/11/20 0401 12/13/20 0156  WBC 15.7* 11.7* 8.9  RBC 4.01* 3.80* 4.03*  HGB 12.1* 11.4* 12.0*  HCT 35.8* 34.7* 36.7*  MCV 89.3 91.3 91.1  MCH 30.2 30.0 29.8  MCHC 33.8 32.9 32.7  RDW 13.7 13.8 13.5  PLT 460* 485* 547*    BNPNo results for input(s): BNP, PROBNP in the last 168 hours.   DDimer No results for input(s): DDIMER in the last 168 hours.   Radiology    No results found.  Cardiac Studies   Limited echo 11/30/20: 1. Left ventricular ejection fraction, by estimation, is 65 to 70%. The  left ventricle has normal function. The left ventricle has no regional  wall motion abnormalities. There is mild left ventricular hypertrophy.  Left  ventricular diastolic parameters  are indeterminate.   2. Right ventricular systolic function is normal. The right ventricular  size is normal. Tricuspid regurgitation signal is inadequate for assessing  PA pressure.   3. The mitral valve is grossly normal.   4. Aortic valve regurgitation is mild.   5. Aortic dilatation noted. There is mild dilatation of the aortic root,  measuring 42 mm.   Comparison(s): Prior images reviewed side by side. Limited study. LVEF  remains normal range.    Echocardiogram 11/28/20: 1. Left ventricular ejection fraction, by estimation, is 55 to 60%. The  left ventricle has normal function. The left ventricle has no regional  wall motion abnormalities. There is moderate concentric left ventricular  hypertrophy. Left ventricular  diastolic parameters are consistent with Grade I diastolic dysfunction  (impaired relaxation).   2. Right ventricular systolic function is normal. The right ventricular  size is normal. Tricuspid regurgitation signal is inadequate for assessing  PA pressure.   3. The mitral valve is normal in structure. Trivial mitral valve  regurgitation. No evidence of mitral stenosis.   4. The aortic valve is tricuspid. Aortic valve regurgitation is mild.   5. Aortic dilatation noted. There is mild dilatation of the aortic root,  measuring 43 mm.   Patient Profile     75 y.o. male with a PMH of HTN, HLD, DM type 2, CVA in 2019, NSVT, OSA not on CPAP, and CKD stage 2 who is admitted with SBO, hyponatremia, and acute encephalopathy. During admission he developed NSVT then more sustained runs, progressive IVCD by EKG, then reportedly  bradycardia and unresponsiveness. He had compressions for under a minute with ROSC, no shocks delivered. He was intubated for hypoxic respiratory failure and required pressor support. He was transferred to [facility] for further management. Hospital course also complicated by dysphagia (requiring CorTrak), persistent confusion,  AKI on CKD stage II, fever (felt possibly due to aspiration during arrest s/p Unasyn). Troponin elevated to 306.   Assessment & Plan    1. Cardiac arrest with NSVT followed by bradycardia: he had brief CPR with ROSC in 1 minute; no shocks. Was given atropine and epinephrine. He was intubated and pressors initiated. He was started on amiodarone>metoprolol for rhythm control. Echo post-arrest showed EF 65-70%, mild LVH, mild AI, mildly dilated aortic root. Planning for cardiac cath however patient remained confused. Medical therapy recommended. He completed 48 hours of IV heparin - Continue aspirin and statin -  Continue Bblocker - Ischemic evaluation pending improvement in mental status   2. NSVT: brief run of NSVT (10 beats) overnight on telemetry. Patient asymptomatic - Continue Bblocker   3. HTN: BP persistently above goal. Home telmisartan remains on hold; Cr stable - Will restart ARB - telmisartan not on formulary, will start irbesartan 43m daily - Continue amlodipine and metoprolol   4. HLD: on atorvastatin prior to admission - Will check FLP for risk stratification - Will restart home atorvastatin   5. DM type 2: A1C 8.6 11/2020; on metformin at home - Continue ISS   Remainder of care per primary team: - SBO - Dysphagia - Leukocytosis - Encephalopathy - Hematuria     For questions or updates, please contact CMadridPlease consult [email or web address] for contact info under        Signed, KAbigail Butts PA-C  12/15/2020, 11:28 AM     Attending Note:   The patient was seen and examined.  Agree with assessment and plan as noted above.  Changes made to the above note as needed.  Patient seen and independently examined with KRoby Lofts PA .   We discussed all aspects of the encounter. I agree with the assessment and plan as stated above.     VT arrest:   he is still confused and is not a candidate for interventional procedure at this point   2.  Premature  ventricular contractions/nonsustained VT.  Continue current dose of beta-blocker.  We will sign off.   It appears that Mr. WDenkwill need to be discharged to a skilled nursing facility.  If and when he makes significant neurologic improvement, we can consider further work-up but at this point he is not a candidate for any invasive procedures.  He may follow up with Dr. BHarl Bowieif he improves neurologically .   Continue current medications.  He may follow-up with his primary medical doctor.   I have spent a total of 40 minutes with patient reviewing hospital  notes , telemetry, EKGs, labs and examining patient as well as establishing an assessment and plan that was discussed with the patient.  > 50% of time was spent in direct patient care.    PThayer Headings JBrooke Bonito, MD, FUpper Arlington Surgery Center Ltd Dba [facility]9/13/2022, 12:41 PM [address]  SJolivuePager [phone]

## 2020-12-16 DIAGNOSIS — K56609 Unspecified intestinal obstruction, unspecified as to partial versus complete obstruction: Secondary | ICD-10-CM | POA: Diagnosis not present

## 2020-12-16 DIAGNOSIS — J9601 Acute respiratory failure with hypoxia: Secondary | ICD-10-CM | POA: Diagnosis not present

## 2020-12-16 DIAGNOSIS — G9340 Encephalopathy, unspecified: Secondary | ICD-10-CM | POA: Diagnosis not present

## 2020-12-16 DIAGNOSIS — R630 Anorexia: Secondary | ICD-10-CM

## 2020-12-16 DIAGNOSIS — E871 Hypo-osmolality and hyponatremia: Secondary | ICD-10-CM | POA: Diagnosis not present

## 2020-12-16 DIAGNOSIS — E1169 Type 2 diabetes mellitus with other specified complication: Secondary | ICD-10-CM

## 2020-12-16 LAB — GLUCOSE, CAPILLARY
Glucose-Capillary: 149 mg/dL — ABNORMAL HIGH (ref 70–99)
Glucose-Capillary: 154 mg/dL — ABNORMAL HIGH (ref 70–99)
Glucose-Capillary: 193 mg/dL — ABNORMAL HIGH (ref 70–99)
Glucose-Capillary: 197 mg/dL — ABNORMAL HIGH (ref 70–99)
Glucose-Capillary: 290 mg/dL — ABNORMAL HIGH (ref 70–99)
Glucose-Capillary: 299 mg/dL — ABNORMAL HIGH (ref 70–99)

## 2020-12-16 LAB — CBC
HCT: 39.1 % (ref 39.0–52.0)
Hemoglobin: 12.9 g/dL — ABNORMAL LOW (ref 13.0–17.0)
MCH: 30.1 pg (ref 26.0–34.0)
MCHC: 33 g/dL (ref 30.0–36.0)
MCV: 91.4 fL (ref 80.0–100.0)
Platelets: 597 10*3/uL — ABNORMAL HIGH (ref 150–400)
RBC: 4.28 MIL/uL (ref 4.22–5.81)
RDW: 13.7 % (ref 11.5–15.5)
WBC: 8 10*3/uL (ref 4.0–10.5)
nRBC: 0 % (ref 0.0–0.2)

## 2020-12-16 NOTE — Progress Notes (Addendum)
PROGRESS NOTE    Albert Garrison   [id number]  DOB: Oct 06, 1945  DOA: 11/27/2020 PCP: Celene Squibb, MD   Brief Narrative:  Albert Garrison is a 75 year old male with hypertension, diabetes mellitus type 2, hyperlipidemia, chronic kidney disease stage II, history of lower GI bleed who presented to [facility] with nausea, diarrhea poor appetite and  subsequent poor oral intake.  Noted to be confused and hyponatremic with a sodium of 118., Cr of 1.27, WBC of 14.3. He was treated with IVF for symptomatic hyponatremia.  On the following day, found to have a distal small bowel obstruction (vs ileus). NG tube placed and general surgery consult requested.    On 8/28, the patient developed hypotension, bradycardia/ He was given Atropine, developed v tach and became pulseless . He was treated with epinephrine and regained ROSC in about 1 minute.  He was subsequently placed on Levophed vasopressin amiodarone and was intubated.   8/29 Transfer to [facility] for surgical / cardiac ongoing evaluation 8/29 Echo LVEF 65-70%, otherwise grossly normal.  8/30 extubated  9/1-TRH assumed care    Subjective: No complaints-thinks he is in [place].    Assessment & Plan:   Principal Problem:   SBO (small bowel obstruction)  -Has resolved with conservative management -Continues to require a core track and is receiving tube feeds  Active Problems: Dysphagia - Has been evaluated by SLP and is on a D2 diet with honey thick liquids -he has not been eating enough to sustain physiological needs and palliative care conversations have been started - Today the family has decided on obtaining a PEG tube - I have placed the request -Continue Osmolite at 60 cc/h along with Prosource  Cardiac arrest- bradycardia followed by NSVT followed - possible NSTEMI Acute respiratory failure -Treated with atropine, epinephrine and subsequently amiodarone and heparin infusions -required intubation from 8/20  8-8/30 - Amiodarone has been discontinued and he is now on metoprolol - He continues to have short bursts of V. Tach -11/30/2020 2D echo revealed an EF of 65 to 70% with mild LV hypertrophy and mild dilatation of the aortic root - Cardiac catheterization has been postponed for multiple reasons including continued altered mental status  Hypertension - Continue amlodipine, metoprolol and Iver Sartain    Hyponatremia AKI superimposed on CKD stage II - Resolved  Aspiration into the lungs with fevers - Suspected to have aspirated during cardiac arrest - He completed 5 days of Unasyn    Acute encephalopathy -Continues to have ongoing confusion although, according to notes he is steadily improving -MRI brain does not reveal any cause for his acute confusion -Continue delirium precautions    Type 2 diabetes mellitus -A1c 8.6 - He is receiving NovoLog 4 units every 4 hours in addition to sliding scale NovoLog -Metformin has been on hold  Hematuria - Secondary to pulling on his catheter on 9/7 - Continue to follow   Time spent in minutes: 35 DVT prophylaxis: enoxaparin (LOVENOX) injection 40 mg Start: 12/03/20 1600 SCDs Start: 11/27/20 1854 Place TED hose Start: 11/27/20 1854  Code Status: Full code Family Communication:  Level of Care: Level of care: Progressive Disposition Plan:  Status is: Inpatient  Remains inpatient appropriate because:IV treatments appropriate due to intensity of illness or inability to take PO  Dispo: The patient is from: Home              Anticipated d/c is to: SNF  Patient currently is not medically stable to d/c.   Difficult to place patient No      Consultants:  Cardiology PCCM Palliative care General surgery Procedures:  Intubation/ extubation Cor trac Antimicrobials:  Anti-infectives (From admission, onward)    Start     Dose/Rate Route Frequency Ordered Stop   12/02/20 1511  ampicillin-sulbactam (UNASYN) 1.5 g in sodium  chloride 0.9 % 100 mL IVPB        1.5 g 200 mL/hr over 30 Minutes Intravenous Every 6 hours 12/02/20 1324 12/05/20 0903   12/01/20 1630  ampicillin-sulbactam (UNASYN) 1.5 g in sodium chloride 0.9 % 100 mL IVPB  Status:  Discontinued        1.5 g 200 mL/hr over 30 Minutes Intravenous Every 6 hours 12/01/20 1531 12/01/20 1534   12/01/20 1630  ampicillin-sulbactam (UNASYN) 1.5 g in sodium chloride 0.9 % 100 mL IVPB  Status:  Discontinued        1.5 g 200 mL/hr over 30 Minutes Intravenous Every 8 hours 12/01/20 1534 12/02/20 1324   11/30/20 1000  vancomycin (VANCOREADY) IVPB 750 mg/150 mL  Status:  Discontinued        750 mg 150 mL/hr over 60 Minutes Intravenous Every 12 hours 11/30/20 0043 12/01/20 0937   11/30/20 0130  vancomycin (VANCOREADY) IVPB 1500 mg/300 mL        1,500 mg 150 mL/hr over 120 Minutes Intravenous  Once 11/30/20 0043 11/30/20 0511   11/30/20 0130  ceFEPIme (MAXIPIME) 2 g in sodium chloride 0.9 % 100 mL IVPB  Status:  Discontinued        2 g 200 mL/hr over 30 Minutes Intravenous Every 12 hours 11/30/20 0043 12/01/20 0937        Objective: Vitals:   12/16/20 0000 12/16/20 0400 12/16/20 0800 12/16/20 1200  BP: 120/83 139/85 131/87 133/71  Pulse:  80 86 71  Resp: (!) 22 (!) 24 18   Temp: 97.7 F (36.5 C) 98.1 F (36.7 C) 98.2 F (36.8 C) 98 F (36.7 C)  TempSrc: Oral Oral Oral Oral  SpO2: 100% 96% 98% 98%  Weight:  77.1 kg    Height:        Intake/Output Summary (Last 24 hours) at 12/16/2020 1450 Last data filed at 12/16/2020 1100 Gross per 24 hour  Intake 240 ml  Output 1925 ml  Net -1685 ml   Filed Weights   12/14/20 0400 12/15/20 0400 12/16/20 0400  Weight: 77.1 kg 78.3 kg 77.1 kg    Examination: General exam: Appears comfortable  HEENT: PERRLA, oral mucosa moist, no sclera icterus or thrush- cor trak is present Respiratory system: Clear to auscultation. Respiratory effort normal. Cardiovascular system: S1 & S2 heard, RRR.   Gastrointestinal  system: Abdomen soft, non-tender, nondistended. Normal bowel sounds. Central nervous system: Alert and oriented only to person. No focal neurological deficits. Extremities: No cyanosis, clubbing or edema Skin: No rashes or ulcers Psychiatry:  Mood & affect appropriate. Poor insight into medical condition    Data Reviewed: I have personally reviewed following labs and imaging studies  CBC: Recent Labs  Lab 12/10/20 0446 12/11/20 0401 12/13/20 0156 12/16/20 0320  WBC 15.7* 11.7* 8.9 8.0  HGB 12.1* 11.4* 12.0* 12.9*  HCT 35.8* 34.7* 36.7* 39.1  MCV 89.3 91.3 91.1 91.4  PLT 460* 485* 547* 606*   Basic Metabolic Panel: Recent Labs  Lab 12/10/20 0446 12/14/20 0535  NA 134* 139  K 4.2 4.2  CL 103 105  CO2 24 26  GLUCOSE  240* 155*  BUN 22 24*  CREATININE 1.10 1.14  CALCIUM 8.1* 8.8*  MG 2.0 2.1  PHOS 2.8 3.7   GFR: Estimated Creatinine Clearance: 58.7 mL/min (by C-G formula based on SCr of 1.14 mg/dL). Liver Function Tests: Recent Labs  Lab 12/10/20 0446 12/14/20 0535  AST 19 22  ALT 22 22  ALKPHOS 50 59  BILITOT 0.5 0.6  PROT 5.7* 6.6  ALBUMIN 2.4* 2.7*   No results for input(s): LIPASE, AMYLASE in the last 168 hours. No results for input(s): AMMONIA in the last 168 hours. Coagulation Profile: No results for input(s): INR, PROTIME in the last 168 hours. Cardiac Enzymes: No results for input(s): CKTOTAL, CKMB, CKMBINDEX, TROPONINI in the last 168 hours. BNP (last 3 results) No results for input(s): PROBNP in the last 8760 hours. HbA1C: No results for input(s): HGBA1C in the last 72 hours. CBG: Recent Labs  Lab 12/15/20 2004 12/16/20 0021 12/16/20 0438 12/16/20 0720 12/16/20 1159  GLUCAP 308* 154* 197* 193* 299*   Lipid Profile: Recent Labs    12/14/20 0535  CHOL 107  HDL 27*  LDLCALC 57  TRIG 114  106  CHOLHDL 4.0   Thyroid Function Tests: No results for input(s): TSH, T4TOTAL, FREET4, T3FREE, THYROIDAB in the last 72 hours. Anemia  Panel: No results for input(s): VITAMINB12, FOLATE, FERRITIN, TIBC, IRON, RETICCTPCT in the last 72 hours. Urine analysis:    Component Value Date/Time   COLORURINE YELLOW 11/27/2020 1035   APPEARANCEUR CLEAR 11/27/2020 1035   LABSPEC 1.016 11/27/2020 1035   PHURINE 6.0 11/27/2020 1035   GLUCOSEU >=500 (A) 11/27/2020 1035   HGBUR SMALL (A) 11/27/2020 1035   BILIRUBINUR NEGATIVE 11/27/2020 1035   KETONESUR 20 (A) 11/27/2020 1035   PROTEINUR 30 (A) 11/27/2020 1035   UROBILINOGEN 0.2 03/24/2014 1320   NITRITE NEGATIVE 11/27/2020 1035   LEUKOCYTESUR NEGATIVE 11/27/2020 1035   Sepsis Labs: $[email or web address]$ (procalcitonin:4,lacticidven:4) )No results found for this or any previous visit (from the past 240 hour(s)).       Radiology Studies: No results found.    Scheduled Meds:  amLODipine  10 mg Oral Daily   aspirin  81 mg Oral Daily   atorvastatin  40 mg Oral Daily   bisacodyl  10 mg Rectal QHS   chlorhexidine gluconate (MEDLINE KIT)  15 mL Mouth Rinse BID   Chlorhexidine Gluconate Cloth  6 each Topical Daily   enoxaparin (LOVENOX) injection  40 mg Subcutaneous Q24H   feeding supplement (PROSource TF)  45 mL Per Tube BID   insulin aspart  0-15 Units Subcutaneous Q4H   insulin aspart  4 Units Subcutaneous Q4H   irbesartan  75 mg Oral Daily   mouth rinse  15 mL Mouth Rinse q12n4p   metoprolol tartrate  25 mg Per Tube BID   sodium chloride flush  3 mL Intravenous Q12H   Continuous Infusions:  sodium chloride Stopped (11/30/20 0009)   feeding supplement (OSMOLITE 1.5 CAL) 60 mL/hr at 12/16/20 1251     LOS: 18 days      Debbe Odea, MD [facility] Pager: [email or web address] 12/16/2020, 2:50 PM

## 2020-12-16 NOTE — Progress Notes (Signed)
PT had 7 beat run of vtach. PT asymptomatic. MD notified.

## 2020-12-16 NOTE — Progress Notes (Signed)
  Speech Language Pathology Treatment: Dysphagia  Patient Details Name: Albert Garrison MRN: [id number] DOB: 07-07-1945 Today's Date: 12/16/2020 Time: [id number] SLP Time Calculation (min) (ACUTE ONLY): 29 min  Assessment / Plan / Recommendation Clinical Impression  Pt awake and alert, requesting water. Following oral care, pt was presented with individual ice chips, which resulted in delayed and inconsistent cough response. Trial of nectar thick liquid also resulted in delayed, inconsistent cough. Pt was noted to be very impulsive with nectar thick liquid via cup, despite mod cues to limit bolus size and rate. Pt tolerated trials of graham cracker, self feeding. He would probably be able to handle dys3 textures from a dysphagia standpoint, however, suspect pt's primary issue is cognitively based. Will continue dys 2 diet with honey thick liquids for now. Pt may benefit from instrumental study to objectively assess swallow function.   HPI HPI: 75 y.o. male presented to [facility] ED with diarrhea, decreased p.o. intake and nausea. Pt admitted with acute encephalopathy and symptomatic hyponatremia. Abdominal distension 8/27. CT abd/pelvis (8/28) (+) SBO and NGT placed. 8/28 pt went into cardiac arrest, concern for possible STEMI. Transferred to [facility] for further treatment. CT head (8/28) revealed no acute intracranial abnormality. Chest xray (8/30) noted "appearance of airspace disease in the right middle lobe. Small left pleural effusion". Hosptial admission complicated by continued impulsivity and agitation with pt pulling out multiple NGT's despite restraints.  ETT: 8/28-8/30. PMHx significant for CKD, BPH, DMII, Hx of CVA, HTN, and GERD. SLE (08/23/2017) revealed cognitive communication deficits post CVA.      SLP Plan  Continue with current plan of care       Recommendations  Diet recommendations: Dysphagia 2 (fine chop);Honey-thick liquid Liquids provided via: Cup Medication Administration: Via  alternative means Supervision: Full supervision/cueing for compensatory strategies Compensations: Minimize environmental distractions;Slow rate;Small sips/bites Postural Changes and/or Swallow Maneuvers: Seated upright 90 degrees;Upright 30-60 min after meal                Oral Care Recommendations: Oral care BID;Staff/trained caregiver to provide oral care Follow up Recommendations: Other (comment) (TBD) SLP Visit Diagnosis: Dysphagia, oropharyngeal phase (R13.12) Plan: Continue with current plan of care       GO               [name] [name], [facility], [name] Speech Language Pathologist Office: [phone]  Shonna Chock 12/16/2020, 11:20 AM

## 2020-12-16 NOTE — Plan of Care (Signed)

## 2020-12-16 NOTE — Progress Notes (Signed)
Nutrition Follow-up  DOCUMENTATION CODES:   Not applicable  INTERVENTION:   -Continue Osmolite 1.5 @ 60 ml/hr via cortrak tube   45 ml Prosource TF BID   Tube feeding regimen provides 2240 kcal, 112 grams of protein, and 1097 ml of H2O.   NUTRITION DIAGNOSIS:   Inadequate oral intake related to inability to eat as evidenced by NPO status.  Progressing; advanced to PO diet on 12/07/20  GOAL:   Patient will meet greater than or equal to 90% of their needs  Met with TF  MONITOR:   PO intake, Diet advancement, Labs, Weight trends, TF tolerance, I & O's  REASON FOR ASSESSMENT:   Ventilator    ASSESSMENT:   75 year old male with PMH of HTN, T2DM, GERD, HOH, hyponatremia, CKD stage II, depression, CVA, OSA, GERD. Pt presented with SBO, acute encephalopathy. Pt required intubation after brief cardiac arrest and initiation of pressor support.  8/28 - intubated 8/30 - extubated 9/02 - diet advanced to clear liquids, Cortrak placed (tip gastric) 9/5- s/p BSE- advanced to dysphagia 1 diet with nectar thick liquids 9/7- s/p MBSS- advanced to dysphagia 1 diet with nectar thick liquids 9/9- s/p BSE- advanced to dysphagia 2 diet with honey thick liquids  Reviewed I/O's: -1.7 L x 24 hours and -12.5 L since 12/02/20  UOP: 2.1 L x 24 hours  Spoke with pt at bedside, who was pleasantly confused at time of visit. He reports "I am not griping, I am happy to be here". Per pt, he did not eat anything today, but had a "good dinner" last night. His swallow is improving and he is doing well with the dysphagia 2 diet texture. Per SLP notes, pt could likely tolerate dysphagia 3 textures, however, intake will likely continue to be minimal secondary to mental status. Reviewed meal completions; noted mostly 10%, however, erratic (10-75%) over the past 48 hours).   Palliative care following; pt has consented to PEG placement.   Labs reviewed: CBGS: [id number] (inpatient orders for glycemic control are  0-15 units insulin aspart every 4 hours and 4 units insulin aspart every 4 hours).    Diet Order:   Diet Order             DIET DYS 2 Room service appropriate? Yes; Fluid consistency: Thin  Diet effective now                   EDUCATION NEEDS:   No education needs have been identified at this time  Skin:  Skin Assessment: Reviewed RN Assessment  Last BM:  12/16/20  Height:   Ht Readings from Last 1 Encounters:  11/29/20 $RemoveB'5\' 11"'oBDhzxlt$  (1.803 m)    Weight:   Wt Readings from Last 1 Encounters:  12/16/20 77.1 kg   BMI:  Body mass index is 23.71 kg/m.  Estimated Nutritional Needs:   Kcal:  2100-2300  Protein:  110-130 grams  Fluid:  >/= 2.0 L since admit    Loistine Chance, RD, LDN, [place] Registered Dietitian II Certified Diabetes Care and Education Specialist Please refer to [facility] for RD and/or RD on-call/weekend/after hours pager

## 2020-12-16 NOTE — Progress Notes (Signed)
Daily Progress Note   Patient Name: Albert Garrison       Date: 12/16/2020 DOB: 07/08/1945  Age: 74 y.o. MRN#: [id number] Attending Physician: Debbe Odea, MD Primary Care Physician: Celene Squibb, MD Admit Date: 11/27/2020  Reason for Consultation/Follow-up: Establishing goals of care  Subjective: Chart Reviewed. Updates Received. Patient Assessed.   Patient sitting up in the recliner. Awake and alert. Able to state name and date of birth appropriately. He does not know where he is. Is able to state [place]'s name [place] Denies pain. Does not appear to be distressed. Coretrak in place. No family present. Mittens in place.   I spoke at length with patient's brother and his wife. Updates provided. We reviewed previous goals of care discussion. Albert Garrison confirms wishes for full code sharing he would want all measures to allow patient an opportunity to thrive.   Detailed discussion regarding aspiration risk and nutrition. He understands Coretrak is temporary. Family shares previous experiences with their mom having a PEG and living in a SNF. Albert Garrison states his brother is a strong guy and he does not wish to "give up" on his brother yet. He states he would like for patient to have a PEG if required for a timed trial. He states he would not want him to live in a facility forever on tube feedings with no quality of life but would like to at least see if patient will show any improvement. Education provided on artificial nutrition in the setting of altered mental status and high risk of dislodgement if patient's mentation does not improve. Albert Garrison is a retired from [employer] and verbalizes his understanding and experience with such situations requiring re-insertion.   Family is clear in expressed goals to continue to treat the treatable. They are remaining hopeful for some improvement but also would not want Albert Garrison to suffer or be dependent the remainder of his life. Albert Garrison shares if  his brother's health were to decline or he showed no significant improvement over time family would then make the decision to focus on his comfort knowing they had at least made reasonable attempts.   All questions answered and support provided.  b  Length of Stay: 18 days  Vital Signs: BP 133/71   Pulse 71   Temp 98 F (36.7 C) (Oral)   Resp 18   Ht '5\' 11"'$  (1.803 m)   Wt 77.1 kg   SpO2 98%   BMI 23.71 kg/m  SpO2: SpO2: 98 % O2 Device: O2 Device: Room Air O2 Flow Rate: O2 Flow Rate (L/min): 2 L/min  Physical Exam: NAD, sitting up in recliner, ill appearing  RRR       Clear bilaterally Alert to self only          Palliative Care Assessment & Plan   Code Status: Full code  Goals of Care/Recommendations: Continue with current plan of care. Family is remaining hopeful for some improvement. Wishes to allow patient every opportunity to thrive, however if no improvement family would then consider focusing more on his comfort.  Detailed discussion and education regarding artificial nutrition/PEG. Family expressed wishes to proceed with PEG placement to allow patient the opportunity to show improvement. Would not want to keep long-term.  PMT will continue to support and follow.  Palliative will follow on as needed basis for now please call if any questions or concerns arise.    Prognosis: Guarded-Poor   Discharge Planning: [place] for rehab  with Palliative care service follow-up  Thank you for allowing the Palliative Medicine Team to assist in the care of this patient.  Time Total: 65 min.   Visit consisted of counseling and education dealing with the complex and emotionally intense issues of symptom management and palliative care in the setting of serious and potentially life-threatening illness.Greater than 50%  of this time was spent counseling and coordinating care related to the above assessment and plan.  Alda Lea, AGPCNP-BC  Palliative  Medicine Team [phone]

## 2020-12-17 ENCOUNTER — Inpatient Hospital Stay (HOSPITAL_COMMUNITY): Payer: Medicare Other

## 2020-12-17 DIAGNOSIS — K56609 Unspecified intestinal obstruction, unspecified as to partial versus complete obstruction: Secondary | ICD-10-CM | POA: Diagnosis not present

## 2020-12-17 DIAGNOSIS — J9601 Acute respiratory failure with hypoxia: Secondary | ICD-10-CM | POA: Diagnosis not present

## 2020-12-17 DIAGNOSIS — E871 Hypo-osmolality and hyponatremia: Secondary | ICD-10-CM | POA: Diagnosis not present

## 2020-12-17 DIAGNOSIS — G9340 Encephalopathy, unspecified: Secondary | ICD-10-CM | POA: Diagnosis not present

## 2020-12-17 LAB — COMPREHENSIVE METABOLIC PANEL
ALT: 21 U/L (ref 0–44)
AST: 19 U/L (ref 15–41)
Albumin: 2.8 g/dL — ABNORMAL LOW (ref 3.5–5.0)
Alkaline Phosphatase: 73 U/L (ref 38–126)
Anion gap: 12 (ref 5–15)
BUN: 34 mg/dL — ABNORMAL HIGH (ref 8–23)
CO2: 24 mmol/L (ref 22–32)
Calcium: 8.9 mg/dL (ref 8.9–10.3)
Chloride: 106 mmol/L (ref 98–111)
Creatinine, Ser: 1.26 mg/dL — ABNORMAL HIGH (ref 0.61–1.24)
GFR, Estimated: 59 mL/min — ABNORMAL LOW (ref >60)
Glucose, Bld: 119 mg/dL — ABNORMAL HIGH (ref 70–99)
Potassium: 4.4 mmol/L (ref 3.5–5.1)
Sodium: 142 mmol/L (ref 135–145)
Total Bilirubin: 0.7 mg/dL (ref 0.3–1.2)
Total Protein: 6.4 g/dL — ABNORMAL LOW (ref 6.5–8.1)

## 2020-12-17 LAB — GLUCOSE, CAPILLARY
Glucose-Capillary: 170 mg/dL — ABNORMAL HIGH (ref 70–99)
Glucose-Capillary: 132 mg/dL — ABNORMAL HIGH (ref 70–99)
Glucose-Capillary: 207 mg/dL — ABNORMAL HIGH (ref 70–99)
Glucose-Capillary: 258 mg/dL — ABNORMAL HIGH (ref 70–99)
Glucose-Capillary: 178 mg/dL — ABNORMAL HIGH (ref 70–99)
Glucose-Capillary: 137 mg/dL — ABNORMAL HIGH (ref 70–99)

## 2020-12-17 LAB — MAGNESIUM: Magnesium: 2.3 mg/dL (ref 1.7–2.4)

## 2020-12-17 LAB — PHOSPHORUS: Phosphorus: 4.1 mg/dL (ref 2.5–4.6)

## 2020-12-17 MED ORDER — ADULT MULTIVITAMIN W/MINERALS CH
1.0000 | ORAL_TABLET | Freq: Every day | ORAL | Status: DC
  Administered 2020-12-17 – 2020-12-23 (×7): 1 via ORAL
  Filled 2020-12-17 (×7): qty 1

## 2020-12-17 MED ORDER — INSULIN ASPART 100 UNIT/ML IJ SOLN
0.0000 [IU] | Freq: Three times a day (TID) | INTRAMUSCULAR | Status: DC
Start: 2020-12-17 — End: 2020-12-23
  Administered 2020-12-17: 3 [IU] via SUBCUTANEOUS
  Administered 2020-12-18: 5 [IU] via SUBCUTANEOUS
  Administered 2020-12-18: 3 [IU] via SUBCUTANEOUS
  Administered 2020-12-19: 5 [IU] via SUBCUTANEOUS
  Administered 2020-12-19 (×2): 3 [IU] via SUBCUTANEOUS
  Administered 2020-12-20 – 2020-12-21 (×4): 5 [IU] via SUBCUTANEOUS
  Administered 2020-12-21: 2 [IU] via SUBCUTANEOUS
  Administered 2020-12-22: 3 [IU] via SUBCUTANEOUS
  Administered 2020-12-22 – 2020-12-23 (×4): 5 [IU] via SUBCUTANEOUS

## 2020-12-17 MED ORDER — TAMSULOSIN HCL 0.4 MG PO CAPS
0.4000 mg | ORAL_CAPSULE | Freq: Every day | ORAL | Status: DC
  Administered 2020-12-17 – 2020-12-22 (×6): 0.4 mg via ORAL
  Filled 2020-12-17 (×6): qty 1

## 2020-12-17 MED ORDER — INSULIN ASPART 100 UNIT/ML IJ SOLN: 4.0000 [IU] | Freq: Three times a day (TID) | INTRAMUSCULAR | Status: DC

## 2020-12-17 MED ORDER — FREE WATER: 200.0000 mL | Freq: Three times a day (TID) | Status: DC

## 2020-12-17 MED ORDER — INSULIN GLARGINE-YFGN 100 UNIT/ML ~~LOC~~ SOLN: 10.0000 [IU] | Freq: Every day | SUBCUTANEOUS | Status: DC

## 2020-12-17 NOTE — Progress Notes (Signed)
SLP Note  Patient Details Name: Albert Garrison MRN: [id number] DOB: 03/09/1946   Will proceed with f/u MBS to determine ability to upgrade from restrictive diet given improvements in attention and mentation.         Albert Garrison, Katherene Ponto 12/17/2020, 7:41 AM

## 2020-12-17 NOTE — Progress Notes (Addendum)
PROGRESS NOTE    Albert Garrison   BWI:[id number]  DOB: 06-29-1945  DOA: 11/27/2020 PCP: Celene Squibb, MD   Brief Narrative:  Albert Garrison is a 75 year old male with hypertension, diabetes mellitus type 2, hyperlipidemia, chronic kidney disease stage II, history of lower GI bleed who presented to [facility] with nausea, diarrhea poor appetite and  subsequent poor oral intake.  Noted to be confused and hyponatremic with a sodium of 118., Cr of 1.27, WBC of 14.3. He was treated with IVF for symptomatic hyponatremia.  On the following day, found to have a distal small bowel obstruction (vs ileus). NG tube placed and general surgery consult requested.    On 8/28, the patient developed hypotension, bradycardia/ He was given Atropine, developed v tach and became pulseless . He was treated with epinephrine and regained ROSC in about 1 minute.  He was subsequently placed on Levophed vasopressin amiodarone and was intubated.   8/29 Transfer to [facility] for surgical / cardiac ongoing evaluation 8/29 Echo LVEF 65-70%, otherwise grossly normal.  8/30 extubated  9/1-TRH assumed care    Subjective: No complaints-remains mostly disoriented.    Assessment & Plan:   Principal Problem:   SBO (small bowel obstruction)  -Has resolved with conservative management  Active Problems: Dysphagia - Has been evaluated by SLP and is on a D2 diet with honey thick liquids -he has not been eating enough to sustain physiological needs and palliative care conversations have been started - His family has decided to pursue a PEG tube - I have placed the request -Continue Osmolite at 60 cc/h along with Prosource -Modified barium swallow was completed today-recommended to continue due to diet with honey thick liquids, crushed meds mixed with pures - Will dc cor track today to allow him to get hungry and follow calorie count through the weekend- have discussed this plan with his brother Albert Garrison  Cardiac  arrest- bradycardia followed by NSVT followed - possible NSTEMI Acute respiratory failure -Treated with atropine, epinephrine and subsequently amiodarone and heparin infusions -required intubation from 8/20 8-8/30 - Amiodarone has been discontinued and he is now on metoprolol - He continues to have short bursts of V. Tach -11/30/2020 2D echo revealed an EF of 65 to 70% with mild LV hypertrophy and mild dilatation of the aortic root - Cardiac catheterization has been postponed for multiple reasons including continued altered mental status  Aspiration into the lungs with fevers - Suspected to have aspirated during cardiac arrest - He completed 5 days of Unasyn    Acute encephalopathy-  -Continues to have ongoing confusion although, according to notes he is steadily improving  - He does continue to require mitts but able to follow commands -MRI brain did not reveal any cause for his acute confusion- ? If he has underlying dementia and now has hospital acquired delirium -Continue delirium precautions    Type 2 diabetes mellitus -A1c 8.6 - he is mostly receiving tube feeds but is not also being fed- He is receiving NovoLog 4 units every 4 hours in addition to sliding scale NovoLog - as we are stopping his tube feeds, will stop the 4 U Novolog and continue on sliding scale while we follow oral intake -Metformin has been on hold  Urinary retention-hematuria -Has had a Foley catheter since admission-we performed a voiding trial yesterday and he failed-required in and out cath twice -Foley being replaced today- start Flomax and give another week before another voiding trial - Hematuria secondary to  pulling on his catheter on 9/7 has resolved  Hypertension - Continue amlodipine, metoprolol and Irbesartan    Hyponatremia AKI superimposed on CKD stage II - Resolved  Time spent in minutes: 35 DVT prophylaxis: enoxaparin (LOVENOX) injection 40 mg Start: 12/03/20 1600 SCDs Start: 11/27/20  1854 Place TED hose Start: 11/27/20 1854  Code Status: Full code Family Communication:  Level of Care: Level of care: Progressive Disposition Plan:  Status is: Inpatient  Remains inpatient appropriate because:IV treatments appropriate due to intensity of illness or inability to take PO  Dispo: The patient is from: Home              Anticipated d/c is to: SNF              Patient currently is not medically stable to d/c.   Difficult to place patient No  Consultants:  Cardiology PCCM Palliative care General surgery Procedures:  Intubation/ extubation Cor trac Antimicrobials:  Anti-infectives (From admission, onward)    Start     Dose/Rate Route Frequency Ordered Stop   12/02/20 1511  ampicillin-sulbactam (UNASYN) 1.5 g in sodium chloride 0.9 % 100 mL IVPB        1.5 g 200 mL/hr over 30 Minutes Intravenous Every 6 hours 12/02/20 1324 12/05/20 0903   12/01/20 1630  ampicillin-sulbactam (UNASYN) 1.5 g in sodium chloride 0.9 % 100 mL IVPB  Status:  Discontinued        1.5 g 200 mL/hr over 30 Minutes Intravenous Every 6 hours 12/01/20 1531 12/01/20 1534   12/01/20 1630  ampicillin-sulbactam (UNASYN) 1.5 g in sodium chloride 0.9 % 100 mL IVPB  Status:  Discontinued        1.5 g 200 mL/hr over 30 Minutes Intravenous Every 8 hours 12/01/20 1534 12/02/20 1324   11/30/20 1000  vancomycin (VANCOREADY) IVPB 750 mg/150 mL  Status:  Discontinued        750 mg 150 mL/hr over 60 Minutes Intravenous Every 12 hours 11/30/20 0043 12/01/20 0937   11/30/20 0130  vancomycin (VANCOREADY) IVPB 1500 mg/300 mL        1,500 mg 150 mL/hr over 120 Minutes Intravenous  Once 11/30/20 0043 11/30/20 0511   11/30/20 0130  ceFEPIme (MAXIPIME) 2 g in sodium chloride 0.9 % 100 mL IVPB  Status:  Discontinued        2 g 200 mL/hr over 30 Minutes Intravenous Every 12 hours 11/30/20 0043 12/01/20 0937        Objective: Vitals:   12/17/20 0748 12/17/20 0800 12/17/20 1004 12/17/20 1140  BP: 136/73 138/77   118/67  Pulse: 75   65  Resp:  (!) 28  (!) 22  Temp: (!) 97.3 F (36.3 C)   (!) 97.3 F (36.3 C)  TempSrc: Oral   Oral  SpO2:      Weight:   79.2 kg   Height:        Intake/Output Summary (Last 24 hours) at 12/17/2020 1227 Last data filed at 12/17/2020 0900 Gross per 24 hour  Intake 1686 ml  Output 1350 ml  Net 336 ml    Filed Weights   12/16/20 0400 12/17/20 0247 12/17/20 1004  Weight: 77.1 kg 78.3 kg 79.2 kg    Examination: General exam: Appears comfortable - pleasantly confused- has mitts on HEENT: PERRLA, oral mucosa moist, no sclera icterus or thrush- cor track present Respiratory system: Clear to auscultation. Respiratory effort normal. Cardiovascular system: S1 & S2 heard, regular rate and rhythm Gastrointestinal system: Abdomen soft, non-tender,  nondistended. Normal bowel sounds   Central nervous system: Alert and oriented only to person. No focal neurological deficits. Follows commands  Extremities: No cyanosis, clubbing or edema Skin: No rashes or ulcers Psychiatry:  Mood & affect appropriate.  No insight into current medical situation    Data Reviewed: I have personally reviewed following labs and imaging studies  CBC: Recent Labs  Lab 12/11/20 0401 12/13/20 0156 12/16/20 0320  WBC 11.7* 8.9 8.0  HGB 11.4* 12.0* 12.9*  HCT 34.7* 36.7* 39.1  MCV 91.3 91.1 91.4  PLT 485* 547* 597*    Basic Metabolic Panel: Recent Labs  Lab 12/14/20 0535 12/17/20 0420  NA 139 142  K 4.2 4.4  CL 105 106  CO2 26 24  GLUCOSE 155* 119*  BUN 24* 34*  CREATININE 1.14 1.26*  CALCIUM 8.8* 8.9  MG 2.1 2.3  PHOS 3.7 4.1    GFR: Estimated Creatinine Clearance: 53.1 mL/min (A) (by C-G formula based on SCr of 1.26 mg/dL (H)). Liver Function Tests: Recent Labs  Lab 12/14/20 0535 12/17/20 0420  AST 22 19  ALT 22 21  ALKPHOS 59 73  BILITOT 0.6 0.7  PROT 6.6 6.4*  ALBUMIN 2.7* 2.8*    No results for input(s): LIPASE, AMYLASE in the last 168 hours. No results  for input(s): AMMONIA in the last 168 hours. Coagulation Profile: No results for input(s): INR, PROTIME in the last 168 hours. Cardiac Enzymes: No results for input(s): CKTOTAL, CKMB, CKMBINDEX, TROPONINI in the last 168 hours. BNP (last 3 results) No results for input(s): PROBNP in the last 8760 hours. HbA1C: No results for input(s): HGBA1C in the last 72 hours. CBG: Recent Labs  Lab 12/16/20 2046 12/17/20 0127 12/17/20 0432 12/17/20 0759 12/17/20 1213  GLUCAP 290* 170* 132* 207* 258*    Lipid Profile: No results for input(s): CHOL, HDL, LDLCALC, TRIG, CHOLHDL, LDLDIRECT in the last 72 hours.  Thyroid Function Tests: No results for input(s): TSH, T4TOTAL, FREET4, T3FREE, THYROIDAB in the last 72 hours. Anemia Panel: No results for input(s): VITAMINB12, FOLATE, FERRITIN, TIBC, IRON, RETICCTPCT in the last 72 hours. Urine analysis:    Component Value Date/Time   COLORURINE YELLOW 11/27/2020 1035   APPEARANCEUR CLEAR 11/27/2020 1035   LABSPEC 1.016 11/27/2020 1035   PHURINE 6.0 11/27/2020 1035   GLUCOSEU >=500 (A) 11/27/2020 1035   HGBUR SMALL (A) 11/27/2020 1035   BILIRUBINUR NEGATIVE 11/27/2020 1035   KETONESUR 20 (A) 11/27/2020 1035   PROTEINUR 30 (A) 11/27/2020 1035   UROBILINOGEN 0.2 03/24/2014 1320   NITRITE NEGATIVE 11/27/2020 1035   LEUKOCYTESUR NEGATIVE 11/27/2020 1035   Sepsis Labs: $[email or web address]$ (procalcitonin:4,lacticidven:4) )No results found for this or any previous visit (from the past 240 hour(s)).       Radiology Studies: DG Swallowing Func-Speech Pathology  Result Date: 12/17/2020 Table formatting from the original result was not included. Objective Swallowing Evaluation: Type of Study: MBS-Modified Barium Swallow Study  Patient Details Name: Albert Garrison MRN: [id number] Date of Birth: [birth year] Today's Date: 12/17/2020 Time: SLP Start Time (ACUTE ONLY): 3716 -SLP Stop Time (ACUTE ONLY): 9678 SLP Time Calculation (min) (ACUTE ONLY): 15 min Past  Medical History: Past Medical History: Diagnosis Date  Arthritis   BPH (benign prostatic hyperplasia)   CKD (chronic kidney disease), stage II   Complicated UTI (urinary tract infection) 03/2014  Depression   GERD (gastroesophageal reflux disease)   History of pneumonia   History of stroke   HOH (hard of hearing)   Hypertension   Hyponatremia  Lower GI bleed 2017  a. ? due to polyp.  NSVT (nonsustained ventricular tachycardia) (HCC)   Rheumatic fever   Childhood  Sleep apnea   Does not use CPAP  Type 2 diabetes mellitus ([place])  Past Surgical History: Past Surgical History: Procedure Laterality Date  CARPAL TUNNEL RELEASE Left 2010  CARPAL TUNNEL RELEASE Right 07/13/2017  Procedure: CARPAL TUNNEL RELEASE;  Surgeon: Carole Civil, MD;  Location: [facility]S;  Service: Orthopedics;  Laterality: Right;  CERVICAL SPINE SURGERY  2010  COLONOSCOPY WITH PROPOFOL N/A 11/30/2015  Procedure: COLONOSCOPY WITH PROPOFOL;  Surgeon: Daneil Dolin, MD;  Location: [facility]ITE;  Service: Endoscopy;  Laterality: N/A;  KNEE ARTHROSCOPY  [facility] surgery - broken nose    POLYPECTOMY  11/30/2015  Procedure: POLYPECTOMY;  Surgeon: Daneil Dolin, MD;  Location: [facility] SU[facility];  Service: Endoscopy;;  polyp at ascending colon, rectal polyp  TRANSURETHRAL INCISION OF PROSTATE N/A 01/20/2015  Procedure: TRANSURETHRAL INCISION OF THE PROSTATE (TUIP);  Surgeon: Irine Seal, MD;  Location: [facility] ORS;  Service: Urology;  Laterality: N/A; HPI: 75 y.o. male presented to [facility] ED with diarrhea, decreased p.o. intake and nausea. Pt admitted with acute encephalopathy and symptomatic hyponatremia. Abdominal distension 8/27. CT abd/pelvis (8/28) (+) SBO and NGT placed. 8/28 pt went into cardiac arrest, concern for possible STEMI. Transferred to [facility] for further treatment. CT head (8/28) revealed no acute intracranial abnormality. Chest xray (8/30) noted "appearance of airspace disease in the right middle lobe. Small left pleural  effusion". Hosptial admission complicated by continued impulsivity and agitation with pt pulling out multiple NGT's despite restraints.  ETT: 8/28-8/30. PMHx significant for CKD, BPH, DMII, Hx of CVA, HTN, and GERD. SLE (08/23/2017) revealed cognitive communication deficits post CVA.  Subjective: Pt seen in radiology for instrumental assessment of swallow function and safety. No family present. Assessment / Plan / Recommendation CHL IP CLINICAL IMPRESSIONS 12/17/2020 Clinical Impression Pt presents with ongoing dysphagia with several factors that could be persistently impacting effective swallowing. Pt has a prior ACDF with hardware present from C3-C6. His brother denies any knowledge of baseline impairment, but suspect pt may have had a mild change in swallowing since that surgery that he compensated for and can no longer discuss. He is noted to have decreased epiglottic deflection, with epiglottic movement restricted by posterior pharyngeal wall, keeping vestibule slightly open at the height of laryngeal elevation. Pt has instances of bolus penetratating the vestibule before hyoid excursion with thin and nectar thick liquids squeezing between the posterior process of the glottis during hyoid excursion. Pts sensation is absent and voice is slightly hoarse. Question the integrity of glottic competence given this finding. Pt was unable to sustain a chin tuck or achieve any other strategies gievn mentation and decreased neck ROM. Recommend pt continue a finely chopped diet given rapid intake of solids with partial mastication and spillage of solids to lower pharynx during ongoing masticaiton. Pt should also continue honey thick liquids. Discussed findings with brother. Recommend a repeat MBS a few weeks after NG tube removal for potential upgrade as presence of NG could be another complicating factor. If there is no improvement in airway protection with liquids after another 14 days approximately, would recommend  referral to ENT to examine larynx directly. SLP Visit Diagnosis Dysphagia, oropharyngeal phase (R13.12) Attention and concentration deficit following -- Frontal lobe and executive function deficit following -- Impact on safety and function Moderate aspiration risk   CHL IP TREATMENT  RECOMMENDATION 12/17/2020 Treatment Recommendations Therapy as outlined in treatment plan below   Prognosis 12/17/2020 Prognosis for Safe Diet Advancement Good Barriers to Reach Goals -- Barriers/Prognosis Comment -- CHL IP DIET RECOMMENDATION 12/17/2020 SLP Diet Recommendations Dysphagia 2 (Fine chop) solids;Honey thick liquids Liquid Administration via Cup;Straw;Spoon Medication Administration Crushed with puree Compensations Minimize environmental distractions;Slow rate;Small sips/bites Postural Changes Remain semi-upright after after feeds/meals (Comment)   CHL IP OTHER RECOMMENDATIONS 12/17/2020 Recommended Consults -- Oral Care Recommendations Oral care BID Other Recommendations --   [facility]L IP FOLLOW UP RECOMMENDATIONS 12/17/2020 Follow up Recommendations Skilled Nursing facility   [facility] IP FREQUENCY AND DURATION 12/17/2020 Speech Therapy Frequency (ACUTE ONLY) min 2x/week Treatment Duration 2 weeks      CHL IP ORAL PHASE 12/17/2020 Oral Phase Impaired Oral - Pudding Teaspoon -- Oral - Pudding Cup -- Oral - Honey Teaspoon NT Oral - Honey Cup WFL Oral - Nectar Teaspoon WFL Oral - Nectar Cup WFL Oral - Nectar Straw WFL Oral - Thin Teaspoon -- Oral - Thin Cup WFL Oral - Thin Straw WFL Oral - Puree WFL Oral - Mech Soft -- Oral - Regular Premature spillage;Decreased bolus cohesion;Delayed oral transit;Holding of bolus Oral - Multi-Consistency -- Oral - Pill -- Oral Phase - Comment --  CHL IP PHARYNGEAL PHASE 12/17/2020 Pharyngeal Phase Impaired Pharyngeal- Pudding Teaspoon -- Pharyngeal -- Pharyngeal- Pudding Cup -- Pharyngeal -- Pharyngeal- Honey Teaspoon NT Pharyngeal -- Pharyngeal- Honey Cup WFL Pharyngeal -- Pharyngeal- Nectar Teaspoon NT  Pharyngeal -- Pharyngeal- Nectar Cup Penetration/Aspiration before swallow;Penetration/Aspiration during swallow;Reduced epiglottic inversion;Reduced airway/laryngeal closure;Trace aspiration Pharyngeal Material enters airway, CONTACTS cords and not ejected out;Material enters airway, passes BELOW cords without attempt by patient to eject out (silent aspiration);Material enters airway, passes BELOW cords then ejected out Pharyngeal- Nectar Straw Penetration/Aspiration before swallow;Penetration/Aspiration during swallow;Reduced epiglottic inversion;Reduced airway/laryngeal closure;Trace aspiration Pharyngeal Material enters airway, CONTACTS cords and not ejected out;Material enters airway, passes BELOW cords without attempt by patient to eject out (silent aspiration) Pharyngeal- Thin Teaspoon -- Pharyngeal -- Pharyngeal- Thin Cup Reduced epiglottic inversion;Penetration/Aspiration before swallow;Penetration/Aspiration during swallow;Trace aspiration;Moderate aspiration;Reduced airway/laryngeal closure Pharyngeal Material enters airway, passes BELOW cords without attempt by patient to eject out (silent aspiration);Material enters airway, CONTACTS cords and then ejected out;Material enters airway, CONTACTS cords and not ejected out Pharyngeal- Thin Straw Reduced epiglottic inversion;Penetration/Aspiration before swallow;Penetration/Aspiration during swallow;Trace aspiration;Moderate aspiration;Reduced airway/laryngeal closure Pharyngeal Material enters airway, passes BELOW cords without attempt by patient to eject out (silent aspiration);Material enters airway, CONTACTS cords and then ejected out;Material enters airway, CONTACTS cords and not ejected out Pharyngeal- Puree WFL Pharyngeal -- Pharyngeal- Mechanical Soft -- Pharyngeal -- Pharyngeal- Regular Pharyngeal residue - valleculae;Pharyngeal residue - pyriform Pharyngeal -- Pharyngeal- Multi-consistency -- Pharyngeal -- Pharyngeal- Pill -- Pharyngeal -- Pharyngeal  Comment --  CHL IP CERVICAL ESOPHAGEAL PHASE 12/09/2020 Cervical Esophageal Phase WFL Pudding Teaspoon -- Pudding Cup -- Honey Teaspoon -- Honey Cup -- Nectar Teaspoon -- Nectar Cup -- Nectar Straw -- Thin Teaspoon -- Thin Cup -- Thin Straw -- Puree -- Mechanical Soft -- Regular -- Multi-consistency -- Pill -- Cervical Esophageal Comment -- DeBlois, Katherene Ponto 12/17/2020, 11:52 AM                 Scheduled Meds:  amLODipine  10 mg Oral Daily   aspirin  81 mg Oral Daily   atorvastatin  40 mg Oral Daily   bisacodyl  10 mg Rectal QHS   chlorhexidine gluconate (MEDLINE KIT)  15 mL Mouth Rinse BID   Chlorhexidine Gluconate Cloth  6 each Topical Daily   enoxaparin (LOVENOX) injection  40 mg Subcutaneous Q24H   feeding supplement (PROSource TF)  45 mL Per Tube BID   insulin aspart  0-15 Units Subcutaneous Q4H   insulin aspart  4 Units Subcutaneous Q4H   irbesartan  75 mg Oral Daily   mouth rinse  15 mL Mouth Rinse q12n4p   metoprolol tartrate  25 mg Per Tube BID   sodium chloride flush  3 mL Intravenous Q12H   Continuous Infusions:  sodium chloride Stopped (11/30/20 0009)   feeding supplement (OSMOLITE 1.5 CAL) 1,000 mL (12/16/20 2228)     LOS: 19 days      Debbe Odea, MD [facility] Pager: [email or web address] 12/17/2020, 12:27 PM

## 2020-12-17 NOTE — Progress Notes (Signed)
Physical Therapy Treatment Patient Details Name: Albert Garrison MRN: [id number] DOB: 08-17-1945 Today's Date: 12/17/2020   History of Present Illness 75 y.o. male presenting to [facility] ED 8/29 with diarrhea, decreased p.o. intake and nausea. Transferred to [facility]. Patient admitted with acute encephalopathy and symptomatic hyponatremia. Abdominal distension 8/27. CT abd (+) SBO and NGT placed. Hosptial admission complicated by continued impulsivity and agitation with patient pulling out multiple NGT's despite restraints. 8/28 wide QRS with frequent consecutive PVCs and LBBB. Bradycardic and hypotensive followed by loss of pulse. Code blue called. ROSC achieved after several mins of CPR. Troponin peaked at 309. Concern for possible STEMI with cardiac cath scheduled 12/08/20.  PRVC 8/28-8/30. Cardiology following. PMHx significant for CKD, BPH, DMII, Hx of CVA, HTN, and GERD.    PT Comments    Patient is pleasant, confused, mitts donned. He follows direction. Min guard for bed mobility, transfers with min assist. Ambulated 100 feet with RW and min assist. Unsteady with turning. Patient will continue to benefit from skilled PT while here to improve functional independence and safety with mobility.    Recommendations for follow up therapy are one component of a multi-disciplinary discharge planning process, led by the attending physician.  Recommendations may be updated based on patient status, additional functional criteria and insurance authorization.  Follow Up Recommendations  SNF;Supervision/Assistance - 24 hour     Equipment Recommendations  Rolling walker with 5" wheels    Recommendations for Other Services       Precautions / Restrictions Precautions Precautions: Fall Restrictions Weight Bearing Restrictions: No     Mobility  Bed Mobility Overal bed mobility: Needs Assistance Bed Mobility: Supine to Sit     Supine to sit: Min guard;HOB elevated     General bed mobility comments:  Assist for safety and verbal cuing for technique    Transfers Overall transfer level: Needs assistance Equipment used: Rolling walker (2 wheeled) Transfers: Sit to/from Stand Sit to Stand: Min assist         General transfer comment: Assist for balance.  Ambulation/Gait Ambulation/Gait assistance: Min assist Gait Distance (Feet): 100 Feet Assistive device: Rolling walker (2 wheeled) Gait Pattern/deviations: Decreased step length - right;Decreased step length - left Gait velocity: decreased   General Gait Details: Assist for balance. Verbal/tactile cues to stay closer to walker when turning.   Stairs             Wheelchair Mobility    Modified Rankin (Stroke Patients Only) Modified Rankin (Stroke Patients Only) Pre-Morbid Rankin Score: No symptoms Modified Rankin: Moderately severe disability     Balance Overall balance assessment: Needs assistance Sitting-balance support: Feet supported Sitting balance-Leahy Scale: Fair Sitting balance - Comments: able to sit unsupported, not able to accept challenge sitting edge of bed Postural control: Posterior lean Standing balance support: Bilateral upper extremity supported;During functional activity Standing balance-Leahy Scale: Fair Standing balance comment: walker and min guard for static standing                            Cognition Arousal/Alertness: Awake/alert Behavior During Therapy: WFL for tasks assessed/performed Overall Cognitive Status: Impaired/Different from baseline Area of Impairment: Orientation;Safety/judgement;Awareness;Problem solving                 Orientation Level: Disoriented to;Place;Time;Situation Current Attention Level: Sustained Memory: Decreased recall of precautions;Decreased short-term memory Following Commands: Follows one step commands consistently Safety/Judgement: Decreased awareness of deficits;Decreased awareness of safety Awareness: Intellectual Problem  Solving:  Requires verbal cues;Requires tactile cues General Comments: Pt. is confused but pleasant      Exercises      General Comments        Pertinent Vitals/Pain Pain Assessment: No/denies pain    Home Living                      Prior Function            PT Goals (current goals can now be found in the care plan section) Acute Rehab PT Goals Patient Stated Goal: none stated PT Goal Formulation: Patient unable to participate in goal setting Time For Goal Achievement: 12/31/20 Potential to Achieve Goals: Fair Progress towards PT goals: Progressing toward goals    Frequency    Min 2X/week      PT Plan Current plan remains appropriate    Co-evaluation              AM-PAC PT "6 Clicks" Mobility   Outcome Measure  Help needed turning from your back to your side while in a flat bed without using bedrails?: None Help needed moving from lying on your back to sitting on the side of a flat bed without using bedrails?: A Little Help needed moving to and from a bed to a chair (including a wheelchair)?: A Little Help needed standing up from a chair using your arms (e.g., wheelchair or bedside chair)?: A Little Help needed to walk in hospital room?: A Little Help needed climbing 3-5 steps with a railing? : A Lot 6 Click Score: 18    End of Session Equipment Utilized During Treatment: Gait belt Activity Tolerance: Patient tolerated treatment well;Patient limited by fatigue Patient left: in chair;with chair alarm set;with restraints reapplied Nurse Communication: Mobility status PT Visit Diagnosis: Unsteadiness on feet (R26.81);Other abnormalities of gait and mobility (R26.89);Muscle weakness (generalized) (M62.81);Difficulty in walking, not elsewhere classified (R26.2)     Time: [id number] PT Time Calculation (min) (ACUTE ONLY): 23 min  Charges:  $Gait Training: 8-22 mins $Therapeutic Activity: 8-22 mins                     [employer], PT,  [facility] 12/17/20,11:02 AM

## 2020-12-17 NOTE — Progress Notes (Signed)
Nutrition Follow-up  DOCUMENTATION CODES:   Not applicable  INTERVENTION:   -Initiate 96 hour calorie count per MD -D/c Osmolite and Prosource TF, as pt no longer has feeding access -Magic cup TID with meals, each supplement provides 290 kcal and 9 grams of protein  -MVI with minerals daily -Feeding assistance with meals  NUTRITION DIAGNOSIS:   Inadequate oral intake related to inability to eat as evidenced by NPO status.  Ongoing  GOAL:   Patient will meet greater than or equal to 90% of their needs  Progressing   MONITOR:   PO intake, Diet advancement, Labs, Weight trends, TF tolerance, I & O's  REASON FOR ASSESSMENT:   Ventilator    ASSESSMENT:   75 year old male with PMH of HTN, T2DM, GERD, HOH, hyponatremia, CKD stage II, depression, CVA, OSA, GERD. Pt presented with SBO, acute encephalopathy. Pt required intubation after brief cardiac arrest and initiation of pressor support.  8/28 - intubated 8/30 - extubated 9/02 - diet advanced to clear liquids, Cortrak placed (tip gastric) 9/5- s/p BSE- advanced to dysphagia 1 diet with nectar thick liquids 9/7- s/p MBSS- advanced to dysphagia 1 diet with nectar thick liquids 9/9- s/p BSE- advanced to dysphagia 2 diet with honey thick liquids 9/15- s/p MSS- continue with dysphagai 2 diet with honey thick liquids, cortrak removed per MD request, TF d/c  Reviewed I/O's: -9 ml x 24 hours and -12.2 L since 12/03/20  UOP: 1.6 L x 24 hours   Cortrak removed per MD and TF d/c. MD requesting calorie count through the weekend.   Intake remains variable. Noted 10-50% of meals today (Breakfast:358 kcals, 15 grams protein; Lunch: 46 kcals, 2 grams protein).   Medications reviewed and include dulcolax.   Labs reviewed: CBGS: [id number] (inpatient orders for glycemic control are 0-15 units insulin aspart TID with meals).   Diet Order:   Diet Order             DIET DYS 2 Room service appropriate? Yes; Fluid consistency: Honey  Thick  Diet effective now                   EDUCATION NEEDS:   No education needs have been identified at this time  Skin:  Skin Assessment: Reviewed RN Assessment  Last BM:  12/17/20  Height:   Ht Readings from Last 1 Encounters:  11/29/20 '5\' 11"'$  (1.803 m)    Weight:   Wt Readings from Last 1 Encounters:  12/17/20 79.2 kg   BMI:  Body mass index is 24.35 kg/m.  Estimated Nutritional Needs:   Kcal:  2100-2300  Protein:  110-130 grams  Fluid:  >/= 2.0 L since admit    Loistine Chance, RD, LDN, [place] Registered Dietitian II Certified Diabetes Care and Education Specialist Please refer to [facility] for RD and/or RD on-call/weekend/after hours pager

## 2020-12-17 NOTE — Progress Notes (Signed)
Modified Barium Swallow Progress Note  Patient Details  Name: Albert Garrison MRN: [id number] Date of Birth: [birth year]  Today's Date: 12/17/2020  Modified Barium Swallow completed.  Full report located under Chart Review in the Imaging Section.  Brief recommendations include the following:  Clinical Impression  Pt presents with ongoing dysphagia with several factors that could be persistently impacting effective swallowing. Pt has a prior ACDF with hardware present from C3-C6. His brother denies any knowledge of baseline impairment, but suspect pt may have had a mild change in swallowing since that surgery that he compensated for and can no longer discuss. He is noted to have decreased epiglottic deflection, with epiglottic movement restricted by posterior pharyngeal wall, keeping vestibule slightly open at the height of laryngeal elevation. Pt has instances of bolus penetratating the vestibule before hyoid excursion with thin and nectar thick liquids squeezing between the posterior process of the glottis during hyoid excursion. Pts sensation is absent and voice is slightly hoarse. Question the integrity of glottic competence given this finding. Pt was unable to sustain a chin tuck or achieve any other strategies gievn mentation and decreased neck ROM. Recommend pt continue a finely chopped diet given rapid intake of solids with partial mastication and spillage of solids to lower pharynx during ongoing masticaiton. Pt should also continue honey thick liquids. Discussed findings with brother. Recommend a repeat MBS a few weeks after NG tube removal for potential upgrade as presence of NG could be another complicating factor. If there is no improvement in airway protection with liquids after another 14 days approximately, would recommend referral to ENT to examine larynx directly.   Swallow Evaluation Recommendations       SLP Diet Recommendations: Dysphagia 2 (Fine chop) solids;Honey thick  liquids   Liquid Administration via: Cup;Straw;Spoon   Medication Administration: Crushed with puree   Supervision: Staff to assist with self feeding   Compensations: Minimize environmental distractions;Slow rate;Small sips/bites   Postural Changes: Remain semi-upright after after feeds/meals (Comment)   Oral Care Recommendations: Oral care BID        Cavion Faiola, Katherene Ponto 12/17/2020,11:51 AM

## 2020-12-18 DIAGNOSIS — K56609 Unspecified intestinal obstruction, unspecified as to partial versus complete obstruction: Secondary | ICD-10-CM | POA: Diagnosis not present

## 2020-12-18 LAB — GLUCOSE, CAPILLARY
Glucose-Capillary: 144 mg/dL — ABNORMAL HIGH (ref 70–99)
Glucose-Capillary: 150 mg/dL — ABNORMAL HIGH (ref 70–99)
Glucose-Capillary: 183 mg/dL — ABNORMAL HIGH (ref 70–99)
Glucose-Capillary: 212 mg/dL — ABNORMAL HIGH (ref 70–99)
Glucose-Capillary: 213 mg/dL — ABNORMAL HIGH (ref 70–99)
Glucose-Capillary: 223 mg/dL — ABNORMAL HIGH (ref 70–99)

## 2020-12-18 LAB — BASIC METABOLIC PANEL
Anion gap: 12 (ref 5–15)
BUN: 35 mg/dL — ABNORMAL HIGH (ref 8–23)
CO2: 24 mmol/L (ref 22–32)
Calcium: 8.9 mg/dL (ref 8.9–10.3)
Chloride: 101 mmol/L (ref 98–111)
Creatinine, Ser: 1.26 mg/dL — ABNORMAL HIGH (ref 0.61–1.24)
GFR, Estimated: 59 mL/min — ABNORMAL LOW (ref >60)
Glucose, Bld: 223 mg/dL — ABNORMAL HIGH (ref 70–99)
Potassium: 4.5 mmol/L (ref 3.5–5.1)
Sodium: 137 mmol/L (ref 135–145)

## 2020-12-18 MED ORDER — INSULIN ASPART 100 UNIT/ML IJ SOLN
4.0000 [IU] | Freq: Once | INTRAMUSCULAR | Status: AC
  Administered 2020-12-18: 4 [IU] via SUBCUTANEOUS

## 2020-12-18 MED ORDER — METOPROLOL TARTRATE 25 MG PO TABS
25.0000 mg | ORAL_TABLET | Freq: Two times a day (BID) | ORAL | Status: DC
  Administered 2020-12-18 – 2020-12-23 (×10): 25 mg via ORAL
  Filled 2020-12-18 (×10): qty 1

## 2020-12-18 NOTE — Progress Notes (Signed)
PROGRESS NOTE    LABRIAN TORREGROSSA   JKD:[id number]  DOB: 11-11-1945  DOA: 11/27/2020 PCP: Celene Squibb, MD   Brief Narrative:  Albert Garrison is a 75 year old male with hypertension, diabetes mellitus type 2, hyperlipidemia, chronic kidney disease stage II, history of lower GI bleed who presented to [facility] with nausea, diarrhea poor appetite and  subsequent poor oral intake.  Noted to be confused and hyponatremic with a sodium of 118., Cr of 1.27, WBC of 14.3. He was treated with IVF for symptomatic hyponatremia.  On the following day, found to have a distal small bowel obstruction (vs ileus). NG tube placed and general surgery consult requested.    On 8/28, the patient developed hypotension, bradycardia/ He was given Atropine, developed v tach and became pulseless . He was treated with epinephrine and regained ROSC in about 1 minute.  He was subsequently placed on Levophed vasopressin amiodarone and was intubated.   8/29 Transfer to [facility] for surgical / cardiac ongoing evaluation 8/29 Echo LVEF 65-70%, otherwise grossly normal.  8/30 extubated  9/1-TRH assumed care    Subjective: No complaints.     Assessment & Plan:   Principal Problem:   SBO (small bowel obstruction)  -Has resolved with conservative management  Active Problems: Dysphagia - Has been evaluated by SLP and is on a D2 diet with honey thick liquids -he has not been eating enough to sustain physiological needs and palliative care conversations have been started - His family has decided to pursue a PEG tube - I have placed the request -Continue Osmolite at 60 cc/h along with Prosource -Modified barium swallow was completed today-recommended to continue due to diet with honey thick liquids, crushed meds mixed with pures - 9/15> dc'd cor track today to allow him to get hungry - follow calorie count through the weekend- have discussed this plan with his brother Ilijah Doucet who is in agreement  Cardiac  arrest- bradycardia followed by NSVT followed - possible NSTEMI Acute respiratory failure -Treated with atropine, epinephrine and subsequently amiodarone and heparin infusions -required intubation from 8/20 8-8/30 - Amiodarone has been discontinued and he is now on metoprolol - He continues to have short bursts of V. Tach -11/30/2020 2D echo revealed an EF of 65 to 70% with mild LV hypertrophy and mild dilatation of the aortic root - Cardiac catheterization has been postponed for multiple reasons including continued altered mental status  Aspiration into the lungs with fevers - Suspected to have aspirated during cardiac arrest - He completed 5 days of Unasyn    Acute encephalopathy-  -Continues to have ongoing confusion although, according to notes he is steadily improving  - He does continue to require mitts but able to follow commands -MRI brain did not reveal any cause for his acute confusion- ? If he has underlying dementia and now has hospital acquired delirium -Continue delirium precautions    Type 2 diabetes mellitus -A1c 8.6 - he is mostly receiving tube feeds but is not also being fed- He is receiving NovoLog 4 units every 4 hours in addition to sliding scale NovoLog - as we are stopping his tube feeds, will stop the 4 U Novolog and continue on sliding scale while we follow oral intake -Metformin has been on hold  Urinary retention-hematuria - 9/15 Has had a Foley catheter since admission-we performed a voiding trial yesterday and he failed-required in and out cath twice- start Flomax and give another week before another voiding trial - Hematuria secondary  to pulling on his catheter on 9/7 has resolved  Hypertension - Continue amlodipine, metoprolol and Irbesartan    Hyponatremia AKI superimposed on CKD stage II - Resolved  Time spent in minutes: 35 DVT prophylaxis: enoxaparin (LOVENOX) injection 40 mg Start: 12/03/20 1600 SCDs Start: 11/27/20 1854 Place TED hose Start:  11/27/20 1854  Code Status: Full code Family Communication:  Level of Care: Level of care: Telemetry Medical Disposition Plan:  Status is: Inpatient  Remains inpatient appropriate because:IV treatments appropriate due to intensity of illness or inability to take PO  Dispo: The patient is from: Home              Anticipated d/c is to: SNF              Patient currently is not medically stable to d/c.   Difficult to place patient No  Consultants:  Cardiology PCCM Palliative care General surgery Procedures:  Intubation/ extubation Cor trac Antimicrobials:  Anti-infectives (From admission, onward)    Start     Dose/Rate Route Frequency Ordered Stop   12/02/20 1511  ampicillin-sulbactam (UNASYN) 1.5 g in sodium chloride 0.9 % 100 mL IVPB        1.5 g 200 mL/hr over 30 Minutes Intravenous Every 6 hours 12/02/20 1324 12/05/20 0903   12/01/20 1630  ampicillin-sulbactam (UNASYN) 1.5 g in sodium chloride 0.9 % 100 mL IVPB  Status:  Discontinued        1.5 g 200 mL/hr over 30 Minutes Intravenous Every 6 hours 12/01/20 1531 12/01/20 1534   12/01/20 1630  ampicillin-sulbactam (UNASYN) 1.5 g in sodium chloride 0.9 % 100 mL IVPB  Status:  Discontinued        1.5 g 200 mL/hr over 30 Minutes Intravenous Every 8 hours 12/01/20 1534 12/02/20 1324   11/30/20 1000  vancomycin (VANCOREADY) IVPB 750 mg/150 mL  Status:  Discontinued        750 mg 150 mL/hr over 60 Minutes Intravenous Every 12 hours 11/30/20 0043 12/01/20 0937   11/30/20 0130  vancomycin (VANCOREADY) IVPB 1500 mg/300 mL        1,500 mg 150 mL/hr over 120 Minutes Intravenous  Once 11/30/20 0043 11/30/20 0511   11/30/20 0130  ceFEPIme (MAXIPIME) 2 g in sodium chloride 0.9 % 100 mL IVPB  Status:  Discontinued        2 g 200 mL/hr over 30 Minutes Intravenous Every 12 hours 11/30/20 0043 12/01/20 0937        Objective: Vitals:   12/18/20 0020 12/18/20 0411 12/18/20 0811 12/18/20 1212  BP:  127/75  114/62  Pulse:  68    Resp:   20  (!) 23  Temp:  97.9 F (36.6 C) 97.8 F (36.6 C) 97.8 F (36.6 C)  TempSrc:  Oral Oral Oral  SpO2:  93%  98%  Weight: 78.4 kg     Height:        Intake/Output Summary (Last 24 hours) at 12/18/2020 1710 Last data filed at 12/18/2020 1300 Gross per 24 hour  Intake 700 ml  Output 1050 ml  Net -350 ml    Filed Weights   12/17/20 0247 12/17/20 1004 12/18/20 0020  Weight: 78.3 kg 79.2 kg 78.4 kg    Examination: General exam: Appears comfortable  HEENT: PERRLA, oral mucosa moist, no sclera icterus or thrush Respiratory system: Clear to auscultation. Respiratory effort normal. Cardiovascular system: S1 & S2 heard, regular rate and rhythm Gastrointestinal system: Abdomen soft, non-tender, nondistended. Normal bowel sounds  Central nervous system: Alert and oriented only to person- No focal neurological deficits. Extremities: No cyanosis, clubbing or edema Skin: No rashes or ulcers Psychiatry:  pleasantly confused    Data Reviewed: I have personally reviewed following labs and imaging studies  CBC: Recent Labs  Lab 12/13/20 0156 12/16/20 0320  WBC 8.9 8.0  HGB 12.0* 12.9*  HCT 36.7* 39.1  MCV 91.1 91.4  PLT 547* 597*    Basic Metabolic Panel: Recent Labs  Lab 12/14/20 0535 12/17/20 0420 12/18/20 0250  NA 139 142 137  K 4.2 4.4 4.5  CL 105 106 101  CO2 _0 GLUCOSE 155* 119* 223*  BUN 24* 34* 35*  CREATININE 1.14 1.26* 1.26*  CALCIUM 8.8* 8.9 8.9  MG 2.1 2.3  --   PHOS 3.7 4.1  --     GFR: Estimated Creatinine Clearance: 53.1 mL/min (A) (by C-G formula based on SCr of 1.26 mg/dL (H)). Liver Function Tests: Recent Labs  Lab 12/14/20 0535 12/17/20 0420  AST 22 19  ALT 22 21  ALKPHOS 59 73  BILITOT 0.6 0.7  PROT 6.6 6.4*  ALBUMIN 2.7* 2.8*    No results for input(s): LIPASE, AMYLASE in the last 168 hours. No results for input(s): AMMONIA in the last 168 hours. Coagulation Profile: No results for input(s): INR, PROTIME in the last 168  hours. Cardiac Enzymes: No results for input(s): CKTOTAL, CKMB, CKMBINDEX, TROPONINI in the last 168 hours. BNP (last 3 results) No results for input(s): PROBNP in the last 8760 hours. HbA1C: No results for input(s): HGBA1C in the last 72 hours. CBG: Recent Labs  Lab 12/18/20 0016 12/18/20 0410 12/18/20 0724 12/18/20 1213 12/18/20 1551  GLUCAP 212* 223* 150* 183* 213*    Lipid Profile: No results for input(s): CHOL, HDL, LDLCALC, TRIG, CHOLHDL, LDLDIRECT in the last 72 hours.  Thyroid Function Tests: No results for input(s): TSH, T4TOTAL, FREET4, T3FREE, THYROIDAB in the last 72 hours. Anemia Panel: No results for input(s): VITAMINB12, FOLATE, FERRITIN, TIBC, IRON, RETICCTPCT in the last 72 hours. Urine analysis:    Component Value Date/Time   COLORURINE YELLOW 11/27/2020 1035   APPEARANCEUR CLEAR 11/27/2020 1035   LABSPEC 1.016 11/27/2020 1035   PHURINE 6.0 11/27/2020 1035   GLUCOSEU >=500 (A) 11/27/2020 1035   HGBUR SMALL (A) 11/27/2020 1035   BILIRUBINUR NEGATIVE 11/27/2020 1035   KETONESUR 20 (A) 11/27/2020 1035   PROTEINUR 30 (A) 11/27/2020 1035   UROBILINOGEN 0.2 03/24/2014 1320   NITRITE NEGATIVE 11/27/2020 1035   LEUKOCYTESUR NEGATIVE 11/27/2020 1035   Sepsis Labs: _1 (procalcitonin:4,lacticidven:4) )No results found for this or any previous visit (from the past 240 hour(s)).       Radiology Studies: DG Swallowing Func-Speech Pathology  Result Date: 12/17/2020 Table formatting from the original result was not included. Objective Swallowing Evaluation: Type of Study: MBS-Modified Barium Swallow Study  Patient Details Name: HIGINIO GROW MRN: [id number] Date of Birth: 1945/08/01 Today's Date: 12/17/2020 Time: SLP Start Time (ACUTE ONLY): 3710 -SLP Stop Time (ACUTE ONLY): 6269 SLP Time Calculation (min) (ACUTE ONLY): 15 min Past Medical History: Past Medical History: Diagnosis Date  Arthritis   BPH (benign prostatic hyperplasia)   CKD (chronic kidney  disease), stage II   Complicated UTI (urinary tract infection) 03/2014  Depression   GERD (gastroesophageal reflux disease)   History of pneumonia   History of stroke   HOH (hard of hearing)   Hypertension   Hyponatremia   Lower GI bleed 2017  a. ? due  to polyp.  NSVT (nonsustained ventricular tachycardia) (HCC)   Rheumatic fever   Childhood  Sleep apnea   Does not use CPAP  Type 2 diabetes mellitus ([place])  Past Surgical History: Past Surgical History: Procedure Laterality Date  CARPAL TUNNEL RELEASE Left 2010  CARPAL TUNNEL RELEASE Right 07/13/2017  Procedure: CARPAL TUNNEL RELEASE;  Surgeon: Carole Civil, MD;  Location: [facility] ORS;  Service: Orthopedics;  Laterality: Right;  CERVICAL SPINE SURGERY  2010  COLONOSCOPY WITH PROPOFOL N/A 11/30/2015  Procedure: COLONOSCOPY WITH PROPOFOL;  Surgeon: Daneil Dolin, MD;  Location: [facility] ENDO SUITE;  Service: Endoscopy;  Laterality: N/A;  KNEE ARTHROSCOPY  [place] surgery - broken nose    POLYPECTOMY  11/30/2015  Procedure: POLYPECTOMY;  Surgeon: Daneil Dolin, MD;  Location: [facility] ENDO SUITE;  Service: Endoscopy;;  polyp at ascending colon, rectal polyp  TRANSURETHRAL INCISION OF PROSTATE N/A 01/20/2015  Procedure: TRANSURETHRAL INCISION OF THE PROSTATE (TUIP);  Surgeon: Irine Seal, MD;  Location: [facility] ORS;  Service: Urology;  Laterality: N/A; HPI: 75 y.o. male presented to [facility] ED with diarrhea, decreased p.o. intake and nausea. Pt admitted with acute encephalopathy and symptomatic hyponatremia. Abdominal distension 8/27. CT abd/pelvis (8/28) (+) SBO and NGT placed. 8/28 pt went into cardiac arrest, concern for possible STEMI. Transferred to [facility] for further treatment. CT head (8/28) revealed no acute intracranial abnormality. Chest xray (8/30) noted "appearance of airspace disease in the right middle lobe. Small left pleural effusion". Hosptial admission complicated by continued impulsivity and agitation with pt pulling out multiple NGT's despite  restraints.  ETT: 8/28-8/30. PMHx significant for CKD, BPH, DMII, Hx of CVA, HTN, and GERD. SLE (08/23/2017) revealed cognitive communication deficits post CVA.  Subjective: Pt seen in radiology for instrumental assessment of swallow function and safety. No family present. Assessment / Plan / Recommendation CHL IP CLINICAL IMPRESSIONS 12/17/2020 Clinical Impression Pt presents with ongoing dysphagia with several factors that could be persistently impacting effective swallowing. Pt has a prior ACDF with hardware present from C3-C6. His brother denies any knowledge of baseline impairment, but suspect pt may have had a mild change in swallowing since that surgery that he compensated for and can no longer discuss. He is noted to have decreased epiglottic deflection, with epiglottic movement restricted by posterior pharyngeal wall, keeping vestibule slightly open at the height of laryngeal elevation. Pt has instances of bolus penetratating the vestibule before hyoid excursion with thin and nectar thick liquids squeezing between the posterior process of the glottis during hyoid excursion. Pts sensation is absent and voice is slightly hoarse. Question the integrity of glottic competence given this finding. Pt was unable to sustain a chin tuck or achieve any other strategies gievn mentation and decreased neck ROM. Recommend pt continue a finely chopped diet given rapid intake of solids with partial mastication and spillage of solids to lower pharynx during ongoing masticaiton. Pt should also continue honey thick liquids. Discussed findings with brother. Recommend a repeat MBS a few weeks after NG tube removal for potential upgrade as presence of NG could be another complicating factor. If there is no improvement in airway protection with liquids after another 14 days approximately, would recommend referral to ENT to examine larynx directly. SLP Visit Diagnosis Dysphagia, oropharyngeal phase (R13.12) Attention and  concentration deficit following -- Frontal lobe and executive function deficit following -- Impact on safety and function Moderate aspiration risk   CHL IP TREATMENT RECOMMENDATION 12/17/2020 Treatment Recommendations Therapy as outlined in  treatment plan below   Prognosis 12/17/2020 Prognosis for Safe Diet Advancement Good Barriers to Reach Goals -- Barriers/Prognosis Comment -- CHL IP DIET RECOMMENDATION 12/17/2020 SLP Diet Recommendations Dysphagia 2 (Fine chop) solids;Honey thick liquids Liquid Administration via Cup;Straw;Spoon Medication Administration Crushed with puree Compensations Minimize environmental distractions;Slow rate;Small sips/bites Postural Changes Remain semi-upright after after feeds/meals (Comment)   CHL IP OTHER RECOMMENDATIONS 12/17/2020 Recommended Consults -- Oral Care Recommendations Oral care BID Other Recommendations --   CHL IP FOLLOW UP RECOMMENDATIONS 12/17/2020 Follow up Recommendations Skilled Nursing facility   [facility] IP FREQUENCY AND DURATION 12/17/2020 Speech Therapy Frequency (ACUTE ONLY) min 2x/week Treatment Duration 2 weeks      CHL IP ORAL PHASE 12/17/2020 Oral Phase Impaired Oral - Pudding Teaspoon -- Oral - Pudding Cup -- Oral - Honey Teaspoon NT Oral - Honey Cup WFL Oral - Nectar Teaspoon WFL Oral - Nectar Cup WFL Oral - Nectar Straw WFL Oral - Thin Teaspoon -- Oral - Thin Cup WFL Oral - Thin Straw WFL Oral - Puree WFL Oral - Mech Soft -- Oral - Regular Premature spillage;Decreased bolus cohesion;Delayed oral transit;Holding of bolus Oral - Multi-Consistency -- Oral - Pill -- Oral Phase - Comment --  CHL IP PHARYNGEAL PHASE 12/17/2020 Pharyngeal Phase Impaired Pharyngeal- Pudding Teaspoon -- Pharyngeal -- Pharyngeal- Pudding Cup -- Pharyngeal -- Pharyngeal- Honey Teaspoon NT Pharyngeal -- Pharyngeal- Honey Cup WFL Pharyngeal -- Pharyngeal- Nectar Teaspoon NT Pharyngeal -- Pharyngeal- Nectar Cup Penetration/Aspiration before swallow;Penetration/Aspiration during swallow;Reduced  epiglottic inversion;Reduced airway/laryngeal closure;Trace aspiration Pharyngeal Material enters airway, CONTACTS cords and not ejected out;Material enters airway, passes BELOW cords without attempt by patient to eject out (silent aspiration);Material enters airway, passes BELOW cords then ejected out Pharyngeal- Nectar Straw Penetration/Aspiration before swallow;Penetration/Aspiration during swallow;Reduced epiglottic inversion;Reduced airway/laryngeal closure;Trace aspiration Pharyngeal Material enters airway, CONTACTS cords and not ejected out;Material enters airway, passes BELOW cords without attempt by patient to eject out (silent aspiration) Pharyngeal- Thin Teaspoon -- Pharyngeal -- Pharyngeal- Thin Cup Reduced epiglottic inversion;Penetration/Aspiration before swallow;Penetration/Aspiration during swallow;Trace aspiration;Moderate aspiration;Reduced airway/laryngeal closure Pharyngeal Material enters airway, passes BELOW cords without attempt by patient to eject out (silent aspiration);Material enters airway, CONTACTS cords and then ejected out;Material enters airway, CONTACTS cords and not ejected out Pharyngeal- Thin Straw Reduced epiglottic inversion;Penetration/Aspiration before swallow;Penetration/Aspiration during swallow;Trace aspiration;Moderate aspiration;Reduced airway/laryngeal closure Pharyngeal Material enters airway, passes BELOW cords without attempt by patient to eject out (silent aspiration);Material enters airway, CONTACTS cords and then ejected out;Material enters airway, CONTACTS cords and not ejected out Pharyngeal- Puree WFL Pharyngeal -- Pharyngeal- Mechanical Soft -- Pharyngeal -- Pharyngeal- Regular Pharyngeal residue - valleculae;Pharyngeal residue - pyriform Pharyngeal -- Pharyngeal- Multi-consistency -- Pharyngeal -- Pharyngeal- Pill -- Pharyngeal -- Pharyngeal Comment --  CHL IP CERVICAL ESOPHAGEAL PHASE 12/09/2020 Cervical Esophageal Phase WFL Pudding Teaspoon -- Pudding Cup --  Honey Teaspoon -- Honey Cup -- Nectar Teaspoon -- Nectar Cup -- Nectar Straw -- Thin Teaspoon -- Thin Cup -- Thin Straw -- Puree -- Mechanical Soft -- Regular -- Multi-consistency -- Pill -- Cervical Esophageal Comment -- DeBlois, Katherene Ponto 12/17/2020, 11:52 AM                 Scheduled Meds:  amLODipine  10 mg Oral Daily   aspirin  81 mg Oral Daily   atorvastatin  40 mg Oral Daily   bisacodyl  10 mg Rectal QHS   chlorhexidine gluconate (MEDLINE KIT)  15 mL Mouth Rinse BID   Chlorhexidine Gluconate Cloth  6 each Topical Daily   enoxaparin (LOVENOX)  injection  40 mg Subcutaneous Q24H   insulin aspart  0-15 Units Subcutaneous TID WC   irbesartan  75 mg Oral Daily   mouth rinse  15 mL Mouth Rinse q12n4p   metoprolol tartrate  25 mg Per Tube BID   multivitamin with minerals  1 tablet Oral Daily   sodium chloride flush  3 mL Intravenous Q12H   tamsulosin  0.4 mg Oral QPC supper   Continuous Infusions:  sodium chloride Stopped (11/30/20 0009)     LOS: 20 days      Debbe Odea, MD [facility] Pager: [email or web address] 12/18/2020, 5:10 PM

## 2020-12-18 NOTE — Plan of Care (Signed)

## 2020-12-18 NOTE — TOC Progression Note (Addendum)
Transition of Care [facility]) - Progression Note    Patient Details  Name: DIYOR MCLEISH MRN: [id number] Date of Birth: 1945-07-22  Transition of Care [facility]) CM/SW Contact  Reece Agar, [place] Phone Number: 12/18/2020, 10:31 AM  Clinical Narrative:    [facility] declined pt but [facility]. CSW followed up with pt brother to provide update, no answer. CSW left a message and will follow up with pt family on DC plan.  CSW contacted pt brother to confirm, pt brother is wanting to stick with the [facility]. Pt brother wants CSW to try [facility] again, CSW will refax pt when tubes are out and restraints are off. CSW will continue to follow pt for DC needs.   Expected Discharge Plan: [place] Barriers to Discharge: Continued Medical Work up  Expected Discharge Plan and Services Expected Discharge Plan: [place] arrangements for the past 2 months: [place]                                       Social Determinants of Health (SDOH) Interventions    Readmission Risk Interventions No flowsheet data found.

## 2020-12-18 NOTE — Progress Notes (Addendum)
Calorie Count Note  96 hour calorie count ordered.  Diet: dysphagia 2 diet with honey thick liquids Supplements: Magic cup TID with meals, each supplement provides 290 kcal and 9 grams of protein   Pt alert, but pleasantly confused at time of visit. He tells this RD "today is a good day". He reports he is eating well, but does not remember eating breakfast. He reports he consumed "corn syrup" for dinner last night.   9/15 Breakfast: 358 kcals, 15 grams protein Lunch: 46 kcals, 2 grams protein Dinner: 194 kcals, 8 grams protein  Total intake: 598 kcal (28% of minimum estimated needs)  25 grams protein (23% of minimum estimated needs)  9/16 Breakfast: 255 kcals, 12 grams protein  Nutrition Dx: Inadequate oral intake related to inability to eat as evidenced by NPO status; advanced to PO diet on 12/07/20  Goal: Patient will meet greater than or equal to 90% of their needs; unmet  Intervention:   -Continue 96 hour calorie count per MD; RD will follow-up Monday, 12/21/20 for final results -Continue Magic cup TID with meals, each supplement provides 290 kcal and 9 grams of protein  -Continue MVI with minerals daily -Continue feeding assistance with meals  Loistine Chance, RD, LDN, [place] Registered Dietitian II Certified Diabetes Care and Education Specialist Please refer to [facility] for RD and/or RD on-call/weekend/after hours pager

## 2020-12-19 DIAGNOSIS — K56609 Unspecified intestinal obstruction, unspecified as to partial versus complete obstruction: Secondary | ICD-10-CM | POA: Diagnosis not present

## 2020-12-19 LAB — BASIC METABOLIC PANEL
Anion gap: 11 (ref 5–15)
BUN: 28 mg/dL — ABNORMAL HIGH (ref 8–23)
CO2: 23 mmol/L (ref 22–32)
Calcium: 8.5 mg/dL — ABNORMAL LOW (ref 8.9–10.3)
Chloride: 102 mmol/L (ref 98–111)
Creatinine, Ser: 1.27 mg/dL — ABNORMAL HIGH (ref 0.61–1.24)
GFR, Estimated: 59 mL/min — ABNORMAL LOW (ref >60)
Glucose, Bld: 192 mg/dL — ABNORMAL HIGH (ref 70–99)
Potassium: 4.1 mmol/L (ref 3.5–5.1)
Sodium: 136 mmol/L (ref 135–145)

## 2020-12-19 LAB — GLUCOSE, CAPILLARY
Glucose-Capillary: 174 mg/dL — ABNORMAL HIGH (ref 70–99)
Glucose-Capillary: 197 mg/dL — ABNORMAL HIGH (ref 70–99)
Glucose-Capillary: 190 mg/dL — ABNORMAL HIGH (ref 70–99)
Glucose-Capillary: 220 mg/dL — ABNORMAL HIGH (ref 70–99)
Glucose-Capillary: 272 mg/dL — ABNORMAL HIGH (ref 70–99)

## 2020-12-19 LAB — CBC
HCT: 35.4 % — ABNORMAL LOW (ref 39.0–52.0)
Hemoglobin: 11.8 g/dL — ABNORMAL LOW (ref 13.0–17.0)
MCH: 29.9 pg (ref 26.0–34.0)
MCHC: 33.3 g/dL (ref 30.0–36.0)
MCV: 89.8 fL (ref 80.0–100.0)
Platelets: 428 10*3/uL — ABNORMAL HIGH (ref 150–400)
RBC: 3.94 MIL/uL — ABNORMAL LOW (ref 4.22–5.81)
RDW: 13.2 % (ref 11.5–15.5)
WBC: 7.8 10*3/uL (ref 4.0–10.5)
nRBC: 0 % (ref 0.0–0.2)

## 2020-12-19 NOTE — Plan of Care (Signed)

## 2020-12-19 NOTE — Progress Notes (Signed)
PROGRESS NOTE    Albert Garrison   MOQ:[id number]  DOB: October 13, 1945  DOA: 11/27/2020 PCP: Celene Squibb, MD   Brief Narrative:  Albert Garrison is a 75 year old male with hypertension, diabetes mellitus type 2, hyperlipidemia, chronic kidney disease stage II, history of lower GI bleed who presented to [facility] with nausea, diarrhea poor appetite and  subsequent poor oral intake.  Noted to be confused and hyponatremic with a sodium of 118., Cr of 1.27, WBC of 14.3. He was treated with IVF for symptomatic hyponatremia.  On the following day, found to have a distal small bowel obstruction (vs ileus). NG tube placed and general surgery consult requested.    On 8/28, the patient developed hypotension, bradycardia/ He was given Atropine, developed v tach and became pulseless . He was treated with epinephrine and regained ROSC in about 1 minute.  He was subsequently placed on Levophed vasopressin amiodarone and was intubated.   8/29 Transfer to [facility] for surgical / cardiac ongoing evaluation 8/29 Echo LVEF 65-70%, otherwise grossly normal.  8/30 extubated  9/1-TRH assumed care    Subjective: He has no complaints today. States he had eggs for breakfast.    Assessment & Plan:   Principal Problem:   SBO (small bowel obstruction)  -Has resolved with conservative management  Active Problems: Dysphagia - Has been evaluated by SLP and is on a D2 diet with honey thick liquids -he has not been eating enough to sustain physiological needs and palliative care conversations have been started - His family has decided to pursue a PEG tube - I have placed the request -Continue Osmolite at 60 cc/h along with Prosource -Modified barium swallow was completed today-recommended to continue due to diet with honey thick liquids, crushed meds mixed with pures - 9/15> dc'd cor track to allow him to get hungry - follow calorie count through the weekend- have discussed this plan with his brother Albert Garrison who is in agreement - 9/17- per RN, he is eating quite well.   Cardiac arrest- bradycardia followed by NSVT followed - possible NSTEMI Acute respiratory failure -Treated with atropine, epinephrine and subsequently amiodarone and heparin infusions -required intubation from 8/20 8-8/30 - Amiodarone has been discontinued and he is now on metoprolol - He continues to have short bursts of V. Tach -11/30/2020 2D echo revealed an EF of 65 to 70% with mild LV hypertrophy and mild dilatation of the aortic root - Cardiac catheterization has been postponed for multiple reasons including continued altered mental status  Aspiration into the lungs with fevers - Suspected to have aspirated during cardiac arrest - He completed 5 days of Unasyn    Acute encephalopathy-  -Continues to have ongoing confusion although, according to notes he is steadily improving  - He does continue to require mitts but able to follow commands -MRI brain did not reveal any cause for his acute confusion- ? If he has underlying dementia and now has hospital acquired delirium -Continue delirium precautions    Type 2 diabetes mellitus -A1c 8.6 - he is mostly receiving tube feeds but is not also being fed- He is receiving NovoLog 4 units every 4 hours in addition to sliding scale NovoLog - as we are stopping his tube feeds, will stop the 4 U Novolog and continue on sliding scale while we follow oral intake -Metformin has been on hold  Urinary retention-hematuria - 9/15 Has had a Foley catheter since admission-we performed a voiding trial yesterday and he failed-required in  and out cath twice- started Flomax and give another week before another voiding trial - Hematuria secondary to pulling on his catheter on 9/7 has resolved  Hypertension - Continue amlodipine, metoprolol and Irbesartan    Hyponatremia AKI superimposed on CKD stage II - Resolved  Time spent in minutes: 35 DVT prophylaxis: enoxaparin (LOVENOX)  injection 40 mg Start: 12/03/20 1600 SCDs Start: 11/27/20 1854 Place TED hose Start: 11/27/20 1854  Code Status: Full code Family Communication:  Level of Care: Level of care: Telemetry Medical Disposition Plan:  Status is: Inpatient  Remains inpatient appropriate because:IV treatments appropriate due to intensity of illness or inability to take PO  Dispo: The patient is from: Home              Anticipated d/c is to: SNF              Patient currently is not medically stable to d/c.   Difficult to place patient No  Consultants:  Cardiology PCCM Palliative care General surgery Procedures:  Intubation/ extubation Cor trac Antimicrobials:  Anti-infectives (From admission, onward)    Start     Dose/Rate Route Frequency Ordered Stop   12/02/20 1511  ampicillin-sulbactam (UNASYN) 1.5 g in sodium chloride 0.9 % 100 mL IVPB        1.5 g 200 mL/hr over 30 Minutes Intravenous Every 6 hours 12/02/20 1324 12/05/20 0903   12/01/20 1630  ampicillin-sulbactam (UNASYN) 1.5 g in sodium chloride 0.9 % 100 mL IVPB  Status:  Discontinued        1.5 g 200 mL/hr over 30 Minutes Intravenous Every 6 hours 12/01/20 1531 12/01/20 1534   12/01/20 1630  ampicillin-sulbactam (UNASYN) 1.5 g in sodium chloride 0.9 % 100 mL IVPB  Status:  Discontinued        1.5 g 200 mL/hr over 30 Minutes Intravenous Every 8 hours 12/01/20 1534 12/02/20 1324   11/30/20 1000  vancomycin (VANCOREADY) IVPB 750 mg/150 mL  Status:  Discontinued        750 mg 150 mL/hr over 60 Minutes Intravenous Every 12 hours 11/30/20 0043 12/01/20 0937   11/30/20 0130  vancomycin (VANCOREADY) IVPB 1500 mg/300 mL        1,500 mg 150 mL/hr over 120 Minutes Intravenous  Once 11/30/20 0043 11/30/20 0511   11/30/20 0130  ceFEPIme (MAXIPIME) 2 g in sodium chloride 0.9 % 100 mL IVPB  Status:  Discontinued        2 g 200 mL/hr over 30 Minutes Intravenous Every 12 hours 11/30/20 0043 12/01/20 0937        Objective: Vitals:   12/19/20 0400  12/19/20 0436 12/19/20 0736 12/19/20 1115  BP: 117/74 117/74 (!) 144/77 119/74  Pulse: 69 65 78 76  Resp: $Remo'16  18 19  'bherM$ Temp: 98.5 F (36.9 C)  98.4 F (36.9 C) 98.6 F (37 C)  TempSrc: Oral  Oral Oral  SpO2: 97%  98% 98%  Weight:      Height:        Intake/Output Summary (Last 24 hours) at 12/19/2020 1212 Last data filed at 12/19/2020 1014 Gross per 24 hour  Intake 480 ml  Output 1475 ml  Net -995 ml    Filed Weights   12/17/20 1004 12/18/20 0020 12/19/20 0000  Weight: 79.2 kg 78.4 kg 76.6 kg    Examination: General exam: Appears comfortable  HEENT: PERRLA, oral mucosa moist, no sclera icterus or thrush Respiratory system: Clear to auscultation. Respiratory effort normal. Cardiovascular system: S1 & S2  heard, regular rate and rhythm Gastrointestinal system: Abdomen soft, non-tender, nondistended. Normal bowel sounds   Central nervous system: Alert and oriented only to person. No focal neurological deficits. Extremities: No cyanosis, clubbing or edema Skin: No rashes or ulcers Psychiatry:  Mood & affect appropriate.     Data Reviewed: I have personally reviewed following labs and imaging studies  CBC: Recent Labs  Lab 12/13/20 0156 12/16/20 0320 12/19/20 0302  WBC 8.9 8.0 7.8  HGB 12.0* 12.9* 11.8*  HCT 36.7* 39.1 35.4*  MCV 91.1 91.4 89.8  PLT 547* 597* 428*    Basic Metabolic Panel: Recent Labs  Lab 12/14/20 0535 12/17/20 0420 12/18/20 0250 12/19/20 0302  NA 139 142 137 136  K 4.2 4.4 4.5 4.1  CL 105 106 101 102  CO2 $Re'26 24 24 23  'hbK$ GLUCOSE 155* 119* 223* 192*  BUN 24* 34* 35* 28*  CREATININE 1.14 1.26* 1.26* 1.27*  CALCIUM 8.8* 8.9 8.9 8.5*  MG 2.1 2.3  --   --   PHOS 3.7 4.1  --   --     GFR: Estimated Creatinine Clearance: 52.7 mL/min (A) (by C-G formula based on SCr of 1.27 mg/dL (H)). Liver Function Tests: Recent Labs  Lab 12/14/20 0535 12/17/20 0420  AST 22 19  ALT 22 21  ALKPHOS 59 73  BILITOT 0.6 0.7  PROT 6.6 6.4*  ALBUMIN 2.7*  2.8*    No results for input(s): LIPASE, AMYLASE in the last 168 hours. No results for input(s): AMMONIA in the last 168 hours. Coagulation Profile: No results for input(s): INR, PROTIME in the last 168 hours. Cardiac Enzymes: No results for input(s): CKTOTAL, CKMB, CKMBINDEX, TROPONINI in the last 168 hours. BNP (last 3 results) No results for input(s): PROBNP in the last 8760 hours. HbA1C: No results for input(s): HGBA1C in the last 72 hours. CBG: Recent Labs  Lab 12/18/20 1551 12/18/20 2252 12/19/20 0645 12/19/20 0719 12/19/20 1111  GLUCAP 213* 144* 174* 197* 190*    Lipid Profile: No results for input(s): CHOL, HDL, LDLCALC, TRIG, CHOLHDL, LDLDIRECT in the last 72 hours.  Thyroid Function Tests: No results for input(s): TSH, T4TOTAL, FREET4, T3FREE, THYROIDAB in the last 72 hours. Anemia Panel: No results for input(s): VITAMINB12, FOLATE, FERRITIN, TIBC, IRON, RETICCTPCT in the last 72 hours. Urine analysis:    Component Value Date/Time   COLORURINE YELLOW 11/27/2020 1035   APPEARANCEUR CLEAR 11/27/2020 1035   LABSPEC 1.016 11/27/2020 1035   PHURINE 6.0 11/27/2020 1035   GLUCOSEU >=500 (A) 11/27/2020 1035   HGBUR SMALL (A) 11/27/2020 1035   BILIRUBINUR NEGATIVE 11/27/2020 1035   KETONESUR 20 (A) 11/27/2020 1035   PROTEINUR 30 (A) 11/27/2020 1035   UROBILINOGEN 0.2 03/24/2014 1320   NITRITE NEGATIVE 11/27/2020 1035   LEUKOCYTESUR NEGATIVE 11/27/2020 1035   Sepsis Labs: $RemoveBefo'[email or web address]$ (procalcitonin:4,lacticidven:4) )No results found for this or any previous visit (from the past 240 hour(s)).       Radiology Studies: No results found.    Scheduled Meds:  amLODipine  10 mg Oral Daily   aspirin  81 mg Oral Daily   atorvastatin  40 mg Oral Daily   bisacodyl  10 mg Rectal QHS   chlorhexidine gluconate (MEDLINE KIT)  15 mL Mouth Rinse BID   Chlorhexidine Gluconate Cloth  6 each Topical Daily   enoxaparin (LOVENOX) injection  40 mg Subcutaneous Q24H    insulin aspart  0-15 Units Subcutaneous TID WC   mouth rinse  15 mL Mouth Rinse q12n4p   metoprolol  tartrate  25 mg Oral BID   multivitamin with minerals  1 tablet Oral Daily   sodium chloride flush  3 mL Intravenous Q12H   tamsulosin  0.4 mg Oral QPC supper   Continuous Infusions:  sodium chloride Stopped (11/30/20 0009)     LOS: 21 days      Debbe Odea, MD [facility] Pager: [email or web address] 12/19/2020, 12:12 PM

## 2020-12-20 DIAGNOSIS — K56609 Unspecified intestinal obstruction, unspecified as to partial versus complete obstruction: Secondary | ICD-10-CM | POA: Diagnosis not present

## 2020-12-20 LAB — GLUCOSE, CAPILLARY
Glucose-Capillary: 208 mg/dL — ABNORMAL HIGH (ref 70–99)
Glucose-Capillary: 148 mg/dL — ABNORMAL HIGH (ref 70–99)
Glucose-Capillary: 212 mg/dL — ABNORMAL HIGH (ref 70–99)
Glucose-Capillary: 217 mg/dL — ABNORMAL HIGH (ref 70–99)
Glucose-Capillary: 142 mg/dL — ABNORMAL HIGH (ref 70–99)

## 2020-12-20 NOTE — Progress Notes (Signed)
PROGRESS NOTE    Albert Garrison   CWU:[id number]  DOB: Feb 16, 1946  DOA: 11/27/2020 PCP: Celene Squibb, MD   Brief Narrative:  Albert Garrison is a 75 year old male with hypertension, diabetes mellitus type 2, hyperlipidemia, chronic kidney disease stage II, history of lower GI bleed who presented to [facility] with nausea, diarrhea poor appetite and  subsequent poor oral intake.  Noted to be confused and hyponatremic with a sodium of 118., Cr of 1.27, WBC of 14.3. He was treated with IVF for symptomatic hyponatremia.  On the following day, found to have a distal small bowel obstruction (vs ileus). NG tube placed and general surgery consult requested.    On 8/28, the patient developed hypotension, bradycardia/ He was given Atropine, developed v tach and became pulseless . He was treated with epinephrine and regained ROSC in about 1 minute.  He was subsequently placed on Levophed vasopressin amiodarone and was intubated.   8/29 Transfer to [facility] for surgical / cardiac ongoing evaluation 8/29 Echo LVEF 65-70%, otherwise grossly normal.  8/30 extubated  9/1-TRH assumed care    Subjective: He has no complaints.     Assessment & Plan:   Principal Problem:   SBO (small bowel obstruction)  -Has resolved with conservative management  Active Problems: Dysphagia - Has been evaluated by SLP and is on a D2 diet with honey thick liquids -he has not been eating enough to sustain physiological needs and palliative care conversations have been started - His family has decided to pursue a PEG tube - I have placed the request -Continue Osmolite at 60 cc/h along with Prosource -Modified barium swallow was completed today-recommended to continue due to diet with honey thick liquids, crushed meds mixed with pures - 9/15> dc'd cor track to allow him to get hungry - follow calorie count through the weekend- have discussed this plan with his brother Albert Garrison who is in agreement He appears to  be eating well and it does not appear that he will need supplemental PEG feeds- will follow for the final results of the calorie count tomorrow  Cardiac arrest- bradycardia followed by NSVT followed - possible NSTEMI Acute respiratory failure -Treated with atropine, epinephrine and subsequently amiodarone and heparin infusions -required intubation from 8/20 8-8/30 - Amiodarone has been discontinued and he is now on metoprolol - He continues to have short bursts of V. Tach -11/30/2020 2D echo revealed an EF of 65 to 70% with mild LV hypertrophy and mild dilatation of the aortic root - Cardiac catheterization has been postponed for multiple reasons including continued altered mental status  Aspiration into the lungs with fevers - Suspected to have aspirated during cardiac arrest - He completed 5 days of Unasyn    Acute encephalopathy-  -Continues to have ongoing confusion but he is steadily improving  -MRI brain did not reveal any cause for his acute confusion- ? If he has underlying dementia and now has hospital acquired delirium -Continue delirium precautions    Type 2 diabetes mellitus -A1c 8.6 - he is mostly receiving tube feeds but is not also being fed- He is receiving NovoLog 4 units every 4 hours in addition to sliding scale NovoLog - as we are stopping his tube feeds, will stop the 4 U Novolog and continue on sliding scale while we follow oral intake -Metformin has been on hold  Urinary retention-hematuria - 9/15 Has had a Foley catheter since admission-we performed a voiding trial yesterday and he failed-required in and out  cath twice- started Flomax and will give another week before another voiding trial - Hematuria secondary to pulling on his catheter on 9/7 has resolved  Hypertension - Continue amlodipine, metoprolol and Irbesartan    Hyponatremia AKI superimposed on CKD stage II - Resolved  Time spent in minutes: 35 DVT prophylaxis: enoxaparin (LOVENOX) injection 40 mg  Start: 12/03/20 1600 SCDs Start: 11/27/20 1854 Place TED hose Start: 11/27/20 1854  Code Status: Full code Family Communication:  Level of Care: Level of care: Telemetry Medical Disposition Plan:  Status is: Inpatient  Remains inpatient appropriate because:IV treatments appropriate due to intensity of illness or inability to take PO  Dispo: The patient is from: Home              Anticipated d/c is to: SNF              Patient currently is not medically stable to d/c.   Difficult to place patient No  Consultants:  Cardiology PCCM Palliative care General surgery Procedures:  Intubation/ extubation Cor trac Antimicrobials:  Anti-infectives (From admission, onward)    Start     Dose/Rate Route Frequency Ordered Stop   12/02/20 1511  ampicillin-sulbactam (UNASYN) 1.5 g in sodium chloride 0.9 % 100 mL IVPB        1.5 g 200 mL/hr over 30 Minutes Intravenous Every 6 hours 12/02/20 1324 12/05/20 0903   12/01/20 1630  ampicillin-sulbactam (UNASYN) 1.5 g in sodium chloride 0.9 % 100 mL IVPB  Status:  Discontinued        1.5 g 200 mL/hr over 30 Minutes Intravenous Every 6 hours 12/01/20 1531 12/01/20 1534   12/01/20 1630  ampicillin-sulbactam (UNASYN) 1.5 g in sodium chloride 0.9 % 100 mL IVPB  Status:  Discontinued        1.5 g 200 mL/hr over 30 Minutes Intravenous Every 8 hours 12/01/20 1534 12/02/20 1324   11/30/20 1000  vancomycin (VANCOREADY) IVPB 750 mg/150 mL  Status:  Discontinued        750 mg 150 mL/hr over 60 Minutes Intravenous Every 12 hours 11/30/20 0043 12/01/20 0937   11/30/20 0130  vancomycin (VANCOREADY) IVPB 1500 mg/300 mL        1,500 mg 150 mL/hr over 120 Minutes Intravenous  Once 11/30/20 0043 11/30/20 0511   11/30/20 0130  ceFEPIme (MAXIPIME) 2 g in sodium chloride 0.9 % 100 mL IVPB  Status:  Discontinued        2 g 200 mL/hr over 30 Minutes Intravenous Every 12 hours 11/30/20 0043 12/01/20 0937        Objective: Vitals:   12/19/20 1953 12/19/20 2325  12/20/20 0400 12/20/20 0402  BP: 129/67 130/74  129/89  Pulse: 79 65  64  Resp: (!) 22 18  (!) 23  Temp: 100.3 F (37.9 C) 98 F (36.7 C)  98.2 F (36.8 C)  TempSrc: Oral Oral  Oral  SpO2: 99% 98%  98%  Weight:   77.9 kg   Height:        Intake/Output Summary (Last 24 hours) at 12/20/2020 1103 Last data filed at 12/20/2020 0756 Gross per 24 hour  Intake 580 ml  Output 550 ml  Net 30 ml    Filed Weights   12/18/20 0020 12/19/20 0000 12/20/20 0400  Weight: 78.4 kg 76.6 kg 77.9 kg    Examination: General exam: Appears comfortable  HEENT: PERRLA, oral mucosa moist, no sclera icterus or thrush Respiratory system: Clear to auscultation. Respiratory effort normal. Cardiovascular system: S1 &  S2 heard, regular rate and rhythm Gastrointestinal system: Abdomen soft, non-tender, nondistended. Normal bowel sounds   Central nervous system: Alert and oriented only to person. No focal neurological deficits. Extremities: No cyanosis, clubbing or edema Skin: No rashes or ulcers Psychiatry:  Mood & affect appropriate.     Data Reviewed: I have personally reviewed following labs and imaging studies  CBC: Recent Labs  Lab 12/16/20 0320 12/19/20 0302  WBC 8.0 7.8  HGB 12.9* 11.8*  HCT 39.1 35.4*  MCV 91.4 89.8  PLT 597* 428*    Basic Metabolic Panel: Recent Labs  Lab 12/14/20 0535 12/17/20 0420 12/18/20 0250 12/19/20 0302  NA 139 142 137 136  K 4.2 4.4 4.5 4.1  CL 105 106 101 102  CO2 $Re'26 24 24 23  'CvB$ GLUCOSE 155* 119* 223* 192*  BUN 24* 34* 35* 28*  CREATININE 1.14 1.26* 1.26* 1.27*  CALCIUM 8.8* 8.9 8.9 8.5*  MG 2.1 2.3  --   --   PHOS 3.7 4.1  --   --     GFR: Estimated Creatinine Clearance: 52.7 mL/min (A) (by C-G formula based on SCr of 1.27 mg/dL (H)). Liver Function Tests: Recent Labs  Lab 12/14/20 0535 12/17/20 0420  AST 22 19  ALT 22 21  ALKPHOS 59 73  BILITOT 0.6 0.7  PROT 6.6 6.4*  ALBUMIN 2.7* 2.8*    No results for input(s): LIPASE, AMYLASE in  the last 168 hours. No results for input(s): AMMONIA in the last 168 hours. Coagulation Profile: No results for input(s): INR, PROTIME in the last 168 hours. Cardiac Enzymes: No results for input(s): CKTOTAL, CKMB, CKMBINDEX, TROPONINI in the last 168 hours. BNP (last 3 results) No results for input(s): PROBNP in the last 8760 hours. HbA1C: No results for input(s): HGBA1C in the last 72 hours. CBG: Recent Labs  Lab 12/19/20 1111 12/19/20 1615 12/19/20 2132 12/20/20 0551 12/20/20 0752  GLUCAP 190* 220* 272* 208* 148*    Lipid Profile: No results for input(s): CHOL, HDL, LDLCALC, TRIG, CHOLHDL, LDLDIRECT in the last 72 hours.  Thyroid Function Tests: No results for input(s): TSH, T4TOTAL, FREET4, T3FREE, THYROIDAB in the last 72 hours. Anemia Panel: No results for input(s): VITAMINB12, FOLATE, FERRITIN, TIBC, IRON, RETICCTPCT in the last 72 hours. Urine analysis:    Component Value Date/Time   COLORURINE YELLOW 11/27/2020 1035   APPEARANCEUR CLEAR 11/27/2020 1035   LABSPEC 1.016 11/27/2020 1035   PHURINE 6.0 11/27/2020 1035   GLUCOSEU >=500 (A) 11/27/2020 1035   HGBUR SMALL (A) 11/27/2020 1035   BILIRUBINUR NEGATIVE 11/27/2020 1035   KETONESUR 20 (A) 11/27/2020 1035   PROTEINUR 30 (A) 11/27/2020 1035   UROBILINOGEN 0.2 03/24/2014 1320   NITRITE NEGATIVE 11/27/2020 1035   LEUKOCYTESUR NEGATIVE 11/27/2020 1035   Sepsis Labs: $[email or web address]$ (procalcitonin:4,lacticidven:4) )No results found for this or any previous visit (from the past 240 hour(s)).       Radiology Studies: No results found.    Scheduled Meds:  amLODipine  10 mg Oral Daily   aspirin  81 mg Oral Daily   atorvastatin  40 mg Oral Daily   bisacodyl  10 mg Rectal QHS   chlorhexidine gluconate (MEDLINE KIT)  15 mL Mouth Rinse BID   Chlorhexidine Gluconate Cloth  6 each Topical Daily   enoxaparin (LOVENOX) injection  40 mg Subcutaneous Q24H   insulin aspart  0-15 Units Subcutaneous TID WC   mouth rinse   15 mL Mouth Rinse q12n4p   metoprolol tartrate  25 mg Oral BID  multivitamin with minerals  1 tablet Oral Daily   sodium chloride flush  3 mL Intravenous Q12H   tamsulosin  0.4 mg Oral QPC supper   Continuous Infusions:  sodium chloride Stopped (11/30/20 0009)     LOS: 22 days      Debbe Odea, MD [facility] Pager: [email or web address] 12/20/2020, 11:03 AM

## 2020-12-21 DIAGNOSIS — K56609 Unspecified intestinal obstruction, unspecified as to partial versus complete obstruction: Secondary | ICD-10-CM | POA: Diagnosis not present

## 2020-12-21 LAB — GLUCOSE, CAPILLARY
Glucose-Capillary: 214 mg/dL — ABNORMAL HIGH (ref 70–99)
Glucose-Capillary: 182 mg/dL — ABNORMAL HIGH (ref 70–99)
Glucose-Capillary: 118 mg/dL — ABNORMAL HIGH (ref 70–99)
Glucose-Capillary: 269 mg/dL — ABNORMAL HIGH (ref 70–99)

## 2020-12-21 NOTE — Progress Notes (Addendum)
PROGRESS NOTE    Albert Garrison   ZOX:[id number]  DOB: Aug 12, 1945  DOA: 11/27/2020 PCP: Celene Squibb, MD   Brief Narrative:  Albert Garrison is a 75 year old male with hypertension, diabetes mellitus type 2, hyperlipidemia, chronic kidney disease stage II, history of lower GI bleed who presented to [facility] with nausea, diarrhea poor appetite and  subsequent poor oral intake.  Noted to be confused and hyponatremic with a sodium of 118., Cr of 1.27, WBC of 14.3. He was treated with IVF for symptomatic hyponatremia.  On the following day, found to have a distal small bowel obstruction (vs ileus). NG tube placed and general surgery consult requested.    On 8/28, the patient developed hypotension, bradycardia/ He was given Atropine, developed v tach and became pulseless . He was treated with epinephrine and regained ROSC in about 1 minute.  He was subsequently placed on Levophed vasopressin amiodarone and was intubated.   8/29 Transfer to [facility] for surgical / cardiac ongoing evaluation 8/29 Echo LVEF 65-70%, otherwise grossly normal.  8/30 extubated  9/1-TRH assumed care    Subjective: No complaints today. He thinks he had breakfast but is not sure.     Assessment & Plan:   Principal Problem:   SBO (small bowel obstruction)  -Has resolved with conservative management  Active Problems: Dysphagia - Has been evaluated by SLP and is on a D2 diet with honey thick liquids -he has not been eating enough to sustain physiological needs and palliative care conversations have been started - His family has decided to pursue a PEG tube - I have placed the request -Continue Osmolite at 60 cc/h along with Prosource -Modified barium swallow was completed - recommended to continue D2 diet with honey thick liquids, crushed meds mixed with pures  Poor oral intake - 9/15> dc'd cor track to allow him to get hungry - follow calorie count through the weekend- have discussed this plan with his  brother Abbott Jasinski who is in agreement He appears to be eating well and it does not appear that he will need supplemental PEG feeds- - results of calorie count reveal he is meeting 72% of caloric needs and 65% of protein needs  - supplement ordered by RD today > Magic cup TID  -cont double portion meals and MVi - he needs assistance with feeding  Cardiac arrest- bradycardia followed by NSVT followed - possible NSTEMI Acute respiratory failure -Treated with atropine, epinephrine and subsequently amiodarone and heparin infusions -required intubation from 8/20 8-8/30 - Amiodarone has been discontinued and he is now on metoprolol - He continues to have short bursts of V. Tach -11/30/2020 2D echo revealed an EF of 65 to 70% with mild LV hypertrophy and mild dilatation of the aortic root - Cardiac catheterization has been postponed for multiple reasons including continued altered mental status  Aspiration into the lungs with fevers - Suspected to have aspirated during cardiac arrest - He completed 5 days of Unasyn    Acute encephalopathy-  -Continues to have ongoing confusion but he is steadily improving  -MRI brain did not reveal any cause for his acute confusion- ? If he has underlying dementia and now has hospital acquired delirium -Continue delirium precautions  Addendum   Type 2 diabetes mellitus: A1c 8.6%. -Added Lantus 6 units daily -Continue sliding scale as below -Resume home metformin.  Urinary retention-hematuria - 9/15 Has had a Foley catheter since admission-we performed a voiding trial yesterday and he failed-required in and out  cath twice- started Flomax and will give another week before another voiding trial - Hematuria secondary to pulling on his catheter on 9/7 has resolved  Hypertension - Continue amlodipine, metoprolol and Irbesartan    Hyponatremia AKI superimposed on CKD stage II - Resolved  Time spent in minutes: 35 DVT prophylaxis: enoxaparin (LOVENOX)  injection 40 mg Start: 12/03/20 1600 SCDs Start: 11/27/20 1854 Place TED hose Start: 11/27/20 1854  Code Status: Full code Family Communication:  Level of Care: Level of care: Telemetry Medical Disposition Plan:  Status is: Inpatient  Remains inpatient appropriate because:IV treatments appropriate due to intensity of illness or inability to take PO  Dispo: The patient is from: Home              Anticipated d/c is to: SNF              Patient currently is not medically stable to d/c.   Difficult to place patient No  Consultants:  Cardiology PCCM Palliative care General surgery Procedures:  Intubation/ extubation Cor trac Antimicrobials:  Anti-infectives (From admission, onward)    Start     Dose/Rate Route Frequency Ordered Stop   12/02/20 1511  ampicillin-sulbactam (UNASYN) 1.5 g in sodium chloride 0.9 % 100 mL IVPB        1.5 g 200 mL/hr over 30 Minutes Intravenous Every 6 hours 12/02/20 1324 12/05/20 0903   12/01/20 1630  ampicillin-sulbactam (UNASYN) 1.5 g in sodium chloride 0.9 % 100 mL IVPB  Status:  Discontinued        1.5 g 200 mL/hr over 30 Minutes Intravenous Every 6 hours 12/01/20 1531 12/01/20 1534   12/01/20 1630  ampicillin-sulbactam (UNASYN) 1.5 g in sodium chloride 0.9 % 100 mL IVPB  Status:  Discontinued        1.5 g 200 mL/hr over 30 Minutes Intravenous Every 8 hours 12/01/20 1534 12/02/20 1324   11/30/20 1000  vancomycin (VANCOREADY) IVPB 750 mg/150 mL  Status:  Discontinued        750 mg 150 mL/hr over 60 Minutes Intravenous Every 12 hours 11/30/20 0043 12/01/20 0937   11/30/20 0130  vancomycin (VANCOREADY) IVPB 1500 mg/300 mL        1,500 mg 150 mL/hr over 120 Minutes Intravenous  Once 11/30/20 0043 11/30/20 0511   11/30/20 0130  ceFEPIme (MAXIPIME) 2 g in sodium chloride 0.9 % 100 mL IVPB  Status:  Discontinued        2 g 200 mL/hr over 30 Minutes Intravenous Every 12 hours 11/30/20 0043 12/01/20 0937        Objective: Vitals:   12/21/20 0326  12/21/20 0330 12/21/20 0720 12/21/20 1159  BP: (!) 143/86  (!) 145/78 106/64  Pulse: 80  79 64  Resp: $Remo'17  18 18  'eCrQg$ Temp: 97.7 F (36.5 C)     TempSrc: Oral     SpO2: 96%   97%  Weight:  77.1 kg    Height:        Intake/Output Summary (Last 24 hours) at 12/21/2020 1219 Last data filed at 12/21/2020 0330 Gross per 24 hour  Intake 480 ml  Output 1500 ml  Net -1020 ml    Filed Weights   12/19/20 0000 12/20/20 0400 12/21/20 0330  Weight: 76.6 kg 77.9 kg 77.1 kg    Examination: General exam: Appears comfortable  HEENT: PERRLA, oral mucosa moist, no sclera icterus or thrush Respiratory system: Clear to auscultation. Respiratory effort normal. Cardiovascular system: S1 & S2 heard, regular rate and rhythm  Gastrointestinal system: Abdomen soft, non-tender, nondistended. Normal bowel sounds   Central nervous system: Alert and oriented only to person. No focal neurological deficits. Extremities: No cyanosis, clubbing or edema Skin: No rashes or ulcers Psychiatry:  Mood & affect appropriate.     Data Reviewed: I have personally reviewed following labs and imaging studies  CBC: Recent Labs  Lab 12/16/20 0320 12/19/20 0302  WBC 8.0 7.8  HGB 12.9* 11.8*  HCT 39.1 35.4*  MCV 91.4 89.8  PLT 597* 428*    Basic Metabolic Panel: Recent Labs  Lab 12/17/20 0420 12/18/20 0250 12/19/20 0302  NA 142 137 136  K 4.4 4.5 4.1  CL 106 101 102  CO2 24 24 23   GLUCOSE 119* 223* 192*  BUN 34* 35* 28*  CREATININE 1.26* 1.26* 1.27*  CALCIUM 8.9 8.9 8.5*  MG 2.3  --   --   PHOS 4.1  --   --     GFR: Estimated Creatinine Clearance: 52.7 mL/min (A) (by C-G formula based on SCr of 1.27 mg/dL (H)). Liver Function Tests: Recent Labs  Lab 12/17/20 0420  AST 19  ALT 21  ALKPHOS 73  BILITOT 0.7  PROT 6.4*  ALBUMIN 2.8*    No results for input(s): LIPASE, AMYLASE in the last 168 hours. No results for input(s): AMMONIA in the last 168 hours. Coagulation Profile: No results for  input(s): INR, PROTIME in the last 168 hours. Cardiac Enzymes: No results for input(s): CKTOTAL, CKMB, CKMBINDEX, TROPONINI in the last 168 hours. BNP (last 3 results) No results for input(s): PROBNP in the last 8760 hours. HbA1C: No results for input(s): HGBA1C in the last 72 hours. CBG: Recent Labs  Lab 12/20/20 1150 12/20/20 1607 12/20/20 2027 12/21/20 0546 12/21/20 1147  GLUCAP 212* 217* 142* 214* 182*    Lipid Profile: No results for input(s): CHOL, HDL, LDLCALC, TRIG, CHOLHDL, LDLDIRECT in the last 72 hours.  Thyroid Function Tests: No results for input(s): TSH, T4TOTAL, FREET4, T3FREE, THYROIDAB in the last 72 hours. Anemia Panel: No results for input(s): VITAMINB12, FOLATE, FERRITIN, TIBC, IRON, RETICCTPCT in the last 72 hours. Urine analysis:    Component Value Date/Time   COLORURINE YELLOW 11/27/2020 1035   APPEARANCEUR CLEAR 11/27/2020 1035   LABSPEC 1.016 11/27/2020 1035   PHURINE 6.0 11/27/2020 1035   GLUCOSEU >=500 (A) 11/27/2020 1035   HGBUR SMALL (A) 11/27/2020 1035   BILIRUBINUR NEGATIVE 11/27/2020 1035   KETONESUR 20 (A) 11/27/2020 1035   PROTEINUR 30 (A) 11/27/2020 1035   UROBILINOGEN 0.2 03/24/2014 1320   NITRITE NEGATIVE 11/27/2020 1035   LEUKOCYTESUR NEGATIVE 11/27/2020 1035   Sepsis Labs: @LABRCNTIP (procalcitonin:4,lacticidven:4) )No results found for this or any previous visit (from the past 240 hour(s)).       Radiology Studies: No results found.    Scheduled Meds:  amLODipine  10 mg Oral Daily   aspirin  81 mg Oral Daily   atorvastatin  40 mg Oral Daily   bisacodyl  10 mg Rectal QHS   chlorhexidine gluconate (MEDLINE KIT)  15 mL Mouth Rinse BID   Chlorhexidine Gluconate Cloth  6 each Topical Daily   enoxaparin (LOVENOX) injection  40 mg Subcutaneous Q24H   insulin aspart  0-15 Units Subcutaneous TID WC   mouth rinse  15 mL Mouth Rinse q12n4p   metoprolol tartrate  25 mg Oral BID   multivitamin with minerals  1 tablet Oral  Daily   sodium chloride flush  3 mL Intravenous Q12H   tamsulosin  0.4 mg  Oral QPC supper   Continuous Infusions:  sodium chloride Stopped (11/30/20 0009)     LOS: 23 days      Debbe Odea, MD [facility] Pager: [email or web address] 12/21/2020, 12:19 PM

## 2020-12-21 NOTE — TOC Progression Note (Signed)
Transition of Care [facility]) - Progression Note    Patient Details  Name: Albert Garrison MRN: [id number] Date of Birth: 1945-12-13  Transition of Care [facility]) CM/SW [place], [place] Phone Number: 12/21/2020, 6:06 PM  Clinical Narrative:    CSW pt refaxed to [facility] center for bed offer, CSW will follow up on bed offer and will continue to follow for DC planning needs.                     Expected Discharge Plan: [place] Barriers to Discharge: Continued Medical Work up  Expected Discharge Plan and Services Expected Discharge Plan: [place] arrangements for the past 2 months: [place]                                       Social Determinants of Health (SDOH) Interventions    Readmission Risk Interventions No flowsheet data found.

## 2020-12-21 NOTE — Progress Notes (Signed)
Calorie Count Note  96 hour calorie count ordered.  Diet: dysphagia 2 diet with honey thick liquids Supplements: Magic cup TID with meals, each supplement provides 290 kcal and 9 grams of protein   Pt with improved oral intake over the weekend and was able to meet ~70% of estimated nutritional needs over the past 48 hours.   9/15 Breakfast: 358 kcals, 15 grams protein Lunch: 46 kcals, 2 grams protein Dinner: 194 kcals, 8 grams protein   Total intake: 598 kcal (28% of minimum estimated needs)  25 grams protein (23% of minimum estimated needs)   9/16  Breakfast: 255 kcals, 12 grams protein Lunch: 462 kcals, 27 grams protein Dinner: 173 kcals, 6 grams protein  Total intake: 890 kcal (42% of minimum estimated needs)  45 grams protein (41% of minimum estimated needs)  9/17 Breakfast: 286 kcals, 15 grams protein Lunch: 566 kcals, 32 grams protein Dinner: 763 kcals, 30 grams protein  Total intake: 1615 kcal (77% of minimum estimated needs)  77 grams protein (70% of minimum estimated needs)  9/18 Breakfast: 730 kcals, 31 grams protein Lunch: 778 kcals, 40 grams protein Dinner: nothing documented  Total intake: 1508 kcal (72% of minimum estimated needs)  71 grams protein (65% of minimum estimated needs)  Average Total intake: 1153 kcal (55% of minimum estimated needs)  55 grams protein (50% of minimum estimated needs)  Nutrition Dx: Inadequate oral intake related to inability to eat as evidenced by NPO status; advanced to PO diet on 12/07/20  Goal: Patient will meet greater than or equal to 90% of their needs; unmet   Intervention:   -D/c calorie count -Continue Magic cup TID with meals, each supplement provides 290 kcal and 9 grams of protein  -Continue MVI with minerals daily -Continue feeding assistance with meals -Double protein portions at meals  Loistine Chance, RD, LDN, [place] Registered Dietitian II Certified Diabetes Care and Education Specialist Please refer to  [facility] for RD and/or RD on-call/weekend/after hours pager

## 2020-12-21 NOTE — Progress Notes (Signed)
Physical Therapy Treatment Patient Details Name: Albert Garrison MRN: [id number] DOB: [birth year] Today's Date: 12/21/2020   History of Present Illness Pt is a 75 y.o. admitted 11/27/20 with decreased po intake, nausea; workup for acute encephalopathy, hyponatremia. Pt with SBO 8/27, s/p NGT placement. Course complicated by AMS. CODE BLUE called 8/28; ROSC achieved after several minutes of CPR; suspect aspiration during cardiac arrest. Concern for STEMI. Cardiology unsafe to proceed with cardiac cath 9/6. ETT 8/28-8/30. PMH includes CKD, BPH, DM2, CVA, HTN, GERD.   PT Comments    Pt progressing with mobility. Today's session focused on transfer and gait training without DME, as pt does not use DME at baseline; pt requiring frequent min-modA to prevent LOB. Pt remains limited by generalized weakness, decreased activity tolerance, poor balance strategies/postural reactions, and cognitive impairment, including decreased attention and poor safety awareness. Continue to recommend SNF-level therapies to maximize functional mobility and independence; pt may benefit from ALF long-term pending improvements in cognitive status.    Recommendations for follow up therapy are one component of a multi-disciplinary discharge planning process, led by the attending physician.  Recommendations may be updated based on patient status, additional functional criteria and insurance authorization.  Follow Up Recommendations  SNF;Supervision/Assistance - 24 hour     Equipment Recommendations  Rolling walker with 5" wheels    Recommendations for Other Services       Precautions / Restrictions Precautions Precautions: Fall Restrictions Weight Bearing Restrictions: No     Mobility  Bed Mobility               General bed mobility comments: Received sitting in chair with NT    Transfers Overall transfer level: Needs assistance Equipment used: 1 person hand held assist Transfers: Sit to/from Stand Sit to  Stand: Min assist         General transfer comment: MinA for stability, pt with very poor balance upon standing  Ambulation/Gait Ambulation/Gait assistance: Mod assist;Min assist Gait Distance (Feet): 60 Feet (+ 80') Assistive device: 1 person hand held assist;2 person hand held assist Gait Pattern/deviations: Step-through pattern;Decreased stride length;Staggering left;Staggering right;Narrow base of support Gait velocity: Decreased   General Gait Details: Slow, unsteady gait with intermittent min-modA to prevent LOB, heavy reliance on HHA to maintain stability; pt with poor awareness of decreased balance, easily distracted in hallway, unable to navigate back to room; cues for direction and activity pacing   Stairs             Wheelchair Mobility    Modified Rankin (Stroke Patients Only)       Balance Overall balance assessment: Needs assistance Sitting-balance support: Feet supported Sitting balance-Leahy Scale: Fair     Standing balance support: Bilateral upper extremity supported;During functional activity;Single extremity supported Standing balance-Leahy Scale: Poor Standing balance comment: Reliant on UE support to maintain static balance; external assist to maintain dynamic balance; unable to accept challenge                            Cognition Arousal/Alertness: Awake/alert Behavior During Therapy: Impulsive Overall Cognitive Status: No family/caregiver present to determine baseline cognitive functioning Area of Impairment: Orientation;Attention;Memory;Following commands;Safety/judgement;Awareness;Problem solving                 Orientation Level: Disoriented to;Place;Time;Situation Current Attention Level: Focused;Sustained Memory: Decreased recall of precautions;Decreased short-term memory Following Commands: Follows one step commands inconsistently;Follows one step commands with increased time Safety/Judgement: Decreased awareness of  deficits;Decreased awareness of  safety Awareness: Intellectual Problem Solving: Requires verbal cues;Requires tactile cues General Comments: Pleasantly confused throughout session, very poor attention; suspect impaired cognition exacerbated by [facility]      Exercises      General Comments General comments (skin integrity, edema, etc.): Pt received in room with NT, having removed gown and cardiac monitor, looking for his own clothes      Pertinent Vitals/Pain Pain Assessment: No/denies pain Pain Intervention(s): Monitored during session    Home Living                      Prior Function            PT Goals (current goals can now be found in the care plan section) Progress towards PT goals: Progressing toward goals    Frequency    Min 2X/week      PT Plan Current plan remains appropriate    Co-evaluation              AM-PAC PT "6 Clicks" Mobility   Outcome Measure  Help needed turning from your back to your side while in a flat bed without using bedrails?: A Little Help needed moving from lying on your back to sitting on the side of a flat bed without using bedrails?: A Little Help needed moving to and from a bed to a chair (including a wheelchair)?: A Little Help needed standing up from a chair using your arms (e.g., wheelchair or bedside chair)?: A Little Help needed to walk in hospital room?: A Lot Help needed climbing 3-5 steps with a railing? : A Lot 6 Click Score: 16    End of Session Equipment Utilized During Treatment: Gait belt Activity Tolerance: Patient tolerated treatment well;Patient limited by fatigue Patient left: in chair;with chair alarm set Nurse Communication: Mobility status PT Visit Diagnosis: Unsteadiness on feet (R26.81);Other abnormalities of gait and mobility (R26.89);Muscle weakness (generalized) (M62.81);Difficulty in walking, not elsewhere classified (R26.2)     Time: [id number] PT Time Calculation (min) (ACUTE ONLY): 24  min  Charges:  $Gait Training: 8-22 mins $Therapeutic Activity: 8-22 mins                     Mabeline Caras, PT, DPT Acute Rehabilitation Services  Pager [phone] Office [place] 12/21/2020, 10:58 AM

## 2020-12-22 DIAGNOSIS — N401 Enlarged prostate with lower urinary tract symptoms: Secondary | ICD-10-CM | POA: Diagnosis not present

## 2020-12-22 DIAGNOSIS — G9340 Encephalopathy, unspecified: Secondary | ICD-10-CM | POA: Diagnosis not present

## 2020-12-22 DIAGNOSIS — J9601 Acute respiratory failure with hypoxia: Secondary | ICD-10-CM | POA: Diagnosis not present

## 2020-12-22 DIAGNOSIS — K56609 Unspecified intestinal obstruction, unspecified as to partial versus complete obstruction: Secondary | ICD-10-CM | POA: Diagnosis not present

## 2020-12-22 DIAGNOSIS — R338 Other retention of urine: Secondary | ICD-10-CM

## 2020-12-22 DIAGNOSIS — R1310 Dysphagia, unspecified: Secondary | ICD-10-CM

## 2020-12-22 LAB — GLUCOSE, CAPILLARY
Glucose-Capillary: 194 mg/dL — ABNORMAL HIGH (ref 70–99)
Glucose-Capillary: 208 mg/dL — ABNORMAL HIGH (ref 70–99)
Glucose-Capillary: 234 mg/dL — ABNORMAL HIGH (ref 70–99)
Glucose-Capillary: 233 mg/dL — ABNORMAL HIGH (ref 70–99)

## 2020-12-22 MED ORDER — ADULT MULTIVITAMIN W/MINERALS CH: 1.0000 | ORAL_TABLET | Freq: Every day | ORAL | Status: DC

## 2020-12-22 MED ORDER — INSULIN ASPART 100 UNIT/ML IJ SOLN: 0.0000 [IU] | Freq: Three times a day (TID) | INTRAMUSCULAR | 11 refills | Status: DC

## 2020-12-22 MED ORDER — METOPROLOL TARTRATE 25 MG PO TABS: 25.0000 mg | ORAL_TABLET | Freq: Two times a day (BID) | ORAL | 0 refills | Status: DC

## 2020-12-22 MED ORDER — ASPIRIN 81 MG PO CHEW
81.0000 mg | CHEWABLE_TABLET | Freq: Every day | ORAL | Status: DC
Start: 2020-12-23 — End: 2021-01-11

## 2020-12-22 MED ORDER — TAMSULOSIN HCL 0.4 MG PO CAPS: 0.4000 mg | ORAL_CAPSULE | Freq: Every day | ORAL | 0 refills | Status: DC

## 2020-12-22 NOTE — Discharge Summary (Addendum)
Physician Discharge Summary  HAYZE GAZDA XVQ:[id number] DOB: [birth year] DOA: 11/27/2020  PCP: Celene Squibb, MD  Admit date: 11/27/2020 Discharge date: 12/22/2020  Admitted From: home  Disposition:  SNF   Recommendations for Outpatient Follow-up:  F/u CBGs, start second oral agent if oral intake improves and sugars persistently elevated Continue to assist with feeds SLP to continue to follow at SNF for dysphagia Aspiration precautions Recommend voiding trial in 3-5 days- referral to urology if not able to void  Discharge Condition:  stable   CODE STATUS:  Full code   Diet recommendation:  heart healthy and diabetic- D2 diet with honey thick liquids, crushed meds mixed with pures  Consultants:  Cardiology PCCM Palliative care General surgery Procedures:  Intubation/ extubation Cor trac   Discharge Diagnoses:  Principal Problem:   SBO (small bowel obstruction) ([place]) Active Problems:   Acute hypoxemic respiratory failure - Endotracheally intubated(H[facility])   Shock circulatory ([place])   Cardiac arrest ([place])   Dysphagia   Hyponatremia   Acute encephalopathy   Benign prostatic hyperplasia with urinary retention   Type 2 diabetes mellitus ([place])   Other specified cardiac arrhythmias--- sleep apnea- arrhythmias when he desaturates         Brief Summary: Albert Garrison is a 75 year old male with hypertension, diabetes mellitus type 2, hyperlipidemia, chronic kidney disease stage II, history of lower GI bleed who presented to [facility] with nausea, diarrhea poor appetite and  subsequent poor oral intake.  Noted to be confused and hyponatremic with a sodium of 118., Cr of 1.27, WBC of 14.3. He was treated with IVF for symptomatic hyponatremia.  On the following day, found to have a distal small bowel obstruction (vs ileus). NG tube placed and general surgery consult requested.     On 8/28, the patient developed hypotension, bradycardia/ He was given Atropine, developed v  tach and became pulseless . He was treated with epinephrine and regained ROSC in about 1 minute.  He was subsequently placed on Levophed vasopressin amiodarone and was intubated.   8/29 Transfer to [facility] for surgical / cardiac ongoing evaluation 8/29 Echo LVEF 65-70%, otherwise grossly normal.  8/30 extubated  9/1-TRH assumed care   Hospital Course:  Principal Problem:   SBO (small bowel obstruction)  -Has resolved with conservative management   Active Problems: Dysphagia - Has been evaluated by SLP and is on a D2 diet with honey thick liquids -he has not been eating enough to sustain physiological needs and palliative care conversations have been started - His family has decided to pursue a PEG tube - I have placed the request -Continue Osmolite at 60 cc/h along with Prosource -Modified barium swallow was completed - recommended to continue D2 diet with honey thick liquids, crushed meds mixed with pures   Poor oral intake - previously, there were ongoing discussion about a PEG tube - 9/15> I dc'd cor track to allow him to regain his appetite- followed calorie count through the weekend- have discussed this plan with his brother Roddy Bellamy who is in agreement - results of calorie count reveal he is meeting 72% of caloric needs and 65% of protein needs  - supplement ordered by RD today > Magic cup TID  - cont double portion meals and MVI - he continues to need assistance with feeding   Cardiac arrest- bradycardia followed by NSVT followed - possible NSTEMI Acute respiratory failure -Treated with atropine, epinephrine and subsequently amiodarone and heparin infusions -required intubation from 8/20 8-8/30 -  Amiodarone has been discontinued and he is now on metoprolol - He continues to have short bursts of V. Tach -11/30/2020 2D echo revealed an EF of 65 to 70% with mild LV hypertrophy and mild dilatation of the aortic root - Cardiac catheterization has been postponed for multiple  reasons including continued altered mental status  Aspiration into the lungs with fevers - Suspected to have aspirated during cardiac arrest - He completed 5 days of Unasyn     Acute encephalopathy-  -Continues to have ongoing confusion but he is steadily improving  -MRI brain did not reveal any cause for his acute confusion- ? If he has underlying dementia and now has hospital acquired delirium -Continue delirium precautions     Type 2 diabetes mellitus:  -A1c 8.6 -Metformin resumed- cont SSI for now as oral intake is variable Addendum -Lantus 6 units daily   Urinary retention-hematuria - Has had a Foley catheter since admission-we performed a voiding trial and he failed-required in and out cath twice and then foley replaced - started Flomax and will give another 3-5 before another voiding trial - Hematuria secondary to pulling on his catheter on 9/7 has resolved   Hypertension - Continue amlodipine, metoprolol and Irbesartan     Hyponatremia AKI superimposed on CKD stage II - Resolved   Discharge Exam: Vitals:   12/23/20 0420 12/23/20 0900  BP: 128/63 119/73  Pulse: 63 79  Resp: 16 20  Temp: 98 F (36.7 C) (!) 96.9 F (36.1 C)  SpO2: 96% 94%   Vitals:   12/22/20 1058 12/22/20 2048 12/23/20 0420 12/23/20 0900  BP: 124/64 (!) 138/116 128/63 119/73  Pulse: 66 71 63 79  Resp: 16  16 20   Temp: 98.1 F (36.7 C)  98 F (36.7 C) (!) 96.9 F (36.1 C)  TempSrc: Oral  Oral   SpO2: 95%  96% 94%  Weight:   79.3 kg   Height:        General: Pt is alert, awake, not in acute distress Cardiovascular: RRR, S1/S2 +, no rubs, no gallops Respiratory: CTA bilaterally, no wheezing, no rhonchi Abdominal: Soft, NT, ND, bowel sounds + Extremities: no edema, no cyanosis   Discharge Instructions  Discharge Instructions     Diet - low sodium heart healthy   Complete by: As directed    Diet Carb Modified   Complete by: As directed    Increase activity slowly   Complete by:  As directed       Allergies as of 12/23/2020   No Known Allergies      Medication List     STOP taking these medications    aspirin 325 MG tablet Replaced by: aspirin 81 MG chewable tablet   naproxen 500 MG tablet Commonly known as: NAPROSYN   telmisartan 40 MG tablet Commonly known as: MICARDIS       TAKE these medications    acetaminophen 500 MG tablet Commonly known as: TYLENOL Take 2 tablets (1,000 mg total) by mouth every 8 (eight) hours as needed for up to 10 days.   amLODipine 10 MG tablet Commonly known as: NORVASC Take 1 tablet (10 mg total) by mouth daily.   aspirin 81 MG chewable tablet Chew 1 tablet (81 mg total) by mouth daily. Replaces: aspirin 325 MG tablet   atorvastatin 40 MG tablet Commonly known as: LIPITOR Take 40 mg by mouth daily.   insulin aspart 100 UNIT/ML injection Commonly known as: novoLOG Inject 0-15 Units into the skin 3 (three) times  daily with meals. CBG < 70: Implement Hypoglycemia Standing Orders and refer to Hypoglycemia Standing Orders sidebar report  CBG 70 - 120: 0 units  CBG 121 - 150: 2 units  CBG 151 - 200: 3 units  CBG 201 - 250: 5 units  CBG 251 - 300: 8 units  CBG 301 - 350: 11 units  CBG 351 - 400: 15 units   insulin glargine 100 UNIT/ML injection Commonly known as: LANTUS Inject 0.06 mLs (6 Units total) into the skin daily.   metFORMIN 500 MG tablet Commonly known as: GLUCOPHAGE Take 500 mg by mouth daily with breakfast.   metoprolol tartrate 25 MG tablet Commonly known as: LOPRESSOR Take 1 tablet (25 mg total) by mouth 2 (two) times daily.   multivitamin with minerals Tabs tablet Take 1 tablet by mouth daily.   tamsulosin 0.4 MG Caps capsule Commonly known as: FLOMAX Take 1 capsule (0.4 mg total) by mouth daily after supper.        No Known Allergies    DG Chest 1 View  Result Date: 11/29/2020 CLINICAL DATA:  Status post intubation. EXAM: CHEST  1 VIEW COMPARISON:  Chest radiograph dated  11/29/2020. FINDINGS: Endotracheal tube approximately 7 cm above the carina. Enteric tube extends below the diaphragm with tip in the left upper abdomen likely in the gastric fundus. Bibasilar streaky atelectasis. Developing infiltrate is less likely. No focal consolidation, pleural effusion or pneumothorax. Stable cardiac silhouette. Probable hiatal hernia. No acute osseous pathology. Cervical fusion hardware. IMPRESSION: Endotracheal tube above the carina. Electronically Signed   By: Anner Crete M.D.   On: 11/29/2020 23:10   DG Abd 1 View  Result Date: 11/30/2020 CLINICAL DATA:  Bowel obstruction EXAM: ABDOMEN - 1 VIEW COMPARISON:  CT 11/29/2020 FINDINGS: Esophageal tube tip looped over the stomach. Persistent dilatation of small bowel measuring up to 5 cm consistent with bowel obstruction. IMPRESSION: Persistent dilatation of small bowel consistent with obstruction. Electronically Signed   By: Donavan Foil M.D.   On: 11/30/2020 23:44   CT HEAD WO CONTRAST (5MM)  Result Date: 11/29/2020 CLINICAL DATA:  Encephalopathy EXAM: CT HEAD WITHOUT CONTRAST TECHNIQUE: Contiguous axial images were obtained from the base of the skull through the vertex without intravenous contrast. COMPARISON:  08/22/2017 FINDINGS: Brain: There is no mass, hemorrhage or extra-axial collection. There is generalized atrophy without lobar predilection. Hypodensity of the white matter is most commonly associated with chronic microvascular disease. 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 Vascular: No abnormal hyperdensity of the major intracranial arteries or dural venous sinuses. No intracranial atherosclerosis. Skull: The visualized skull base, calvarium and extracranial soft tissues are normal. Sinuses/Orbits: No fluid levels or advanced mucosal thickening of the visualized paranasal sinuses. No mastoid or middle ear effusion. The orbits are normal. IMPRESSION: 1. No acute intracranial abnormality. 2.  Generalized atrophy and chronic microvascular ischemia. Electronically Signed   By: Ulyses Jarred M.D.   On: 11/29/2020 19:31   MR BRAIN W WO CONTRAST  Result Date: 12/05/2020 CLINICAL DATA:  Anoxic brain damage. Additional history provided: Status post cardiac arrest. ROSC after 5 minutes. EXAM: MRI HEAD WITHOUT AND WITH CONTRAST TECHNIQUE: Multiplanar, multiecho pulse sequences of the brain and surrounding structures were obtained without and with intravenous contrast. CONTRAST:  46mL GADAVIST GADOBUTROL 1 MMOL/ML IV SOLN COMPARISON:  Head CT 11/29/2020.  MRI brain and MRA head 08/23/2017. FINDINGS: Brain: Mild generalized cerebral and cerebellar atrophy. Redemonstrated chronic small-vessel infarct within the left corona radiata/basal  ganglia with associated chronic hemosiderin deposition at this site. Redemonstrated chronic lacunar infarct within the right thalamus. There is no acute infarct. No evidence of an intracranial mass. No extra-axial fluid collection. No midline shift. No pathologic intracranial enhancement. 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 Flow voids otherwise preserved within the proximal large arterial vessels. Skull and upper cervical spine: No focal suspicious marrow lesion. Sinuses/Orbits: Visualized orbits show no acute finding. Near complete fluid opacification of the bilateral sphenoid sinuses. Fluid opacification of the posterior left ethmoid air cells. Small bilateral maxillary sinus mucous retention cysts. Other: Trace fluid within the bilateral mastoid air cells. IMPRESSION: No evidence of acute intracranial abnormality. Specifically, there is no evidence of acute anoxic brain injury at this time. Redemonstrated chronic small vessel infarct within the right corona radiata/basal ganglia with chronic hemosiderin deposition at this site. Redemonstrated chronic lacunar infarct within the right thalamus. 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 Paranasal sinus disease, as described. Bilateral mastoid effusions. Electronically Signed   By: Kellie Simmering D.O.   On: 12/05/2020 17:09   CT ABDOMEN PELVIS W CONTRAST  Result Date: 11/29/2020 CLINICAL DATA:  Bowel obstruction suspected. C.o decreased appetite, nausea, poor p.o. intake. He states that he was constipated for the last few days but last night began experiencing intermittent diarrhea. HX CKD, HTN, GI bleed, DM EXAM: CT ABDOMEN AND PELVIS WITH CONTRAST TECHNIQUE: Multidetector CT imaging of the abdomen and pelvis was performed using the standard protocol following bolus administration of intravenous contrast. CONTRAST:  106mL OMNIPAQUE IOHEXOL 350 MG/ML SOLN COMPARISON:  None. FINDINGS: Lines and tubes: Enteric tube noted coursing below the hemidiaphragm with tip within the gastric lumen and side port at the gastroesophageal junction. Foley catheter tip and balloon terminate within the urinary bladder. Lower chest: Trace bilateral pleural effusions, left greater than right. Hepatobiliary: The hepatic parenchyma is diffusely hypodense compared to the splenic parenchyma consistent with fatty infiltration. No focal liver abnormality. No gallstones, gallbladder wall thickening, or pericholecystic fluid. No biliary dilatation. Pancreas: Diffusely atrophic. No focal lesion. Otherwise normal pancreatic contour. No surrounding inflammatory changes. No main pancreatic ductal dilatation. Spleen: Normal in size without focal abnormality. Adrenals/Urinary Tract: No adrenal nodule bilaterally. Bilateral kidneys enhance symmetrically. Bilateral renal cortical scarring. Pericentimeter fluid density lesion within the right kidney likely represents a simple renal cyst. Subcentimeter hypodensity within left kidney too small to characterize. No hydronephrosis. No hydroureter. The urinary bladder is decompressed. Stomach/Bowel: Stomach is within normal limits. Diffuse  fluid dilatation of the proximal and mid small bowel with a likely transition point within the right mid abdomen (5:42). The distal small bowel is decompressed. The large bowel is decompressed. No evidence of large bowel wall thickening or dilatation. Diffuse left colon and sigmoid diverticulosis. Appendix appears normal. Vascular/Lymphatic: No abdominal aorta or iliac aneurysm. Severe atherosclerotic plaque of the aorta and its branches. No abdominal, pelvic, or inguinal lymphadenopathy. Reproductive: Prostate is unremarkable. Other: Trace free fluid within the pelvis. No intraperitoneal free gas. No organized fluid collection. Musculoskeletal: Bilateral trace volume inguinal hernias. No suspicious lytic or blastic osseous lesions. No acute displaced fracture. Limited evaluation of the visualized ribs due to motion artifact. Multilevel degenerative changes of the spine. IMPRESSION: 1. Small-bowel obstruction with a likely transition point within the right mid abdomen. 2. Enteric tube with tip within the gastric lumen side port but side port at the gastroesophageal junction. Consider advancing by 3 cm. 3. Colonic  diverticulosis with no acute diverticulitis. 4. Bilateral trace pleural effusions. 5.  Aortic Atherosclerosis (ICD10-I70.0). Electronically Signed   By: Iven Finn M.D.   On: 11/29/2020 20:20   DG CHEST PORT 1 VIEW  Result Date: 12/01/2020 CLINICAL DATA:  Respiratory failure EXAM: PORTABLE CHEST 1 VIEW COMPARISON:  Previous day FINDINGS: Stable appearance of endotracheal tube midthoracic trachea. Cervical hardware partially visualized, grossly unchanged. Is a left jugular central venous catheter with tip terminating the SVC. Stable appearance of the cardiomediastinal silhouette. Probable small left pleural. No pneumothorax. Suspect right middle lobe opacity, similar. IMPRESSION: Stable appearance of lines and tubes. Similar appearance of airspace disease in the right middle lobe. Small left pleural  effusion. Electronically Signed   By: Albin Felling M.D.   On: 12/01/2020 08:24   DG CHEST PORT 1 VIEW  Result Date: 11/30/2020 CLINICAL DATA:  Hypoxia EXAM: PORTABLE CHEST 1 VIEW COMPARISON:  11/30/2020, 11/29/2020, 11/28/2020 FINDINGS: Endotracheal tube tip about 3.9 cm superior to carina. Esophageal tube tip overlies the stomach. Left IJ central venous catheter tip over the venous confluence. Probable small left-sided pleural effusion. Increased left basilar airspace disease. Increasing hazy right base airspace disease. Stable cardiomediastinal silhouette IMPRESSION: 1. Similar appearance of support lines and tubes. 2. Small left-sided pleural effusion with increasing airspace disease at left base. Slight increased right infrahilar airspace disease. Electronically Signed   By: Donavan Foil M.D.   On: 11/30/2020 23:46   DG CHEST PORT 1 VIEW  Result Date: 11/30/2020 CLINICAL DATA:  Status post PICC line placement EXAM: PORTABLE CHEST 1 VIEW COMPARISON:  11/29/2020 FINDINGS: Left IJ venous catheter terminates in the upper SVC. Endotracheal tube terminates 4 cm above the carina. Enteric tube terminates in the gastric cardia. No frank interstitial edema. Small bilateral pleural effusions, right greater than left. The heart is normal in size. Defibrillator pad overlying the left hemithorax. Cervical spine fixation hardware, incompletely visualized. IMPRESSION: Left IJ venous catheter terminates in the upper SVC. Otherwise unchanged. Electronically Signed   By: Julian Hy M.D.   On: 11/30/2020 02:14   DG CHEST PORT 1 VIEW  Result Date: 11/29/2020 CLINICAL DATA:  Altered mental status. NG tube placement verification. EXAM: PORTABLE CHEST 1 VIEW COMPARISON:  Chest x-ray dated 11/28/2020. FINDINGS: Enteric tube appears adequately positioned in the stomach. Heart size and mediastinal contours are stable. Lungs are clear. No pleural effusion is seen. IMPRESSION: Enteric tube appears adequately positioned  in the stomach. No active disease. Electronically Signed   By: Franki Cabot M.D.   On: 11/29/2020 09:51   DG CHEST PORT 1 VIEW  Result Date: 11/28/2020 CLINICAL DATA:  75 year old male status post nasogastric tube placement EXAM: PORTABLE CHEST 1 VIEW COMPARISON:  Prior radiograph obtained earlier today FINDINGS: Interval placement of a nasogastric tube. The tube appears well positioned overlying the gastric fundus. Inspiratory volumes are low with bibasilar atelectasis. No evidence of pneumothorax or large effusion. Incompletely imaged dilated small bowel in the upper abdomen suggests underlying bowel obstruction. IMPRESSION: Well-positioned nasogastric tube. Electronically Signed   By: Jacqulynn Cadet M.D.   On: 11/28/2020 10:15   DG ABD ACUTE 2+V W 1V CHEST  Result Date: 11/28/2020 CLINICAL DATA:  Nausea, vomiting, abdominal pain EXAM: DG ABDOMEN ACUTE WITH 1 VIEW CHEST COMPARISON:  08/28/2018 FINDINGS: Relatively low lung volumes with crowding of infrahilar bronchovascular structures Heart size upper limits normal for technique. No definite effusion. No pneumothorax. No free air. Multiple gas dilated small bowel loops in the abdomen. The colon is decompressed.  Aortic Atherosclerosis (ICD10-170.0). Mild spondylitic changes in the lumbar spine. IMPRESSION: 1. Dilated small bowel loops suggesting distal small bowel obstruction. Electronically Signed   By: Lucrezia Europe M.D.   On: 11/28/2020 08:51   DG Abd Portable 1V  Result Date: 12/06/2020 CLINICAL DATA:  Small bowel obstruction. EXAM: PORTABLE ABDOMEN - 1 VIEW COMPARISON:  Abdominal radiographs dated 12/04/2020. FINDINGS: An enteric tube terminates in the stomach. No definite small bowel dilatation is identified. Air-fluid levels and free intraperitoneal air cannot be excluded on the supine exam. Gas overlies the rectum. Degenerative changes are seen in the lumbar spine. IMPRESSION: Nonobstructive bowel gas pattern. Electronically Signed   By: Zerita Boers M.D.   On: 12/06/2020 12:38   DG Abd Portable 1V  Result Date: 12/04/2020 CLINICAL DATA:  Feeding tube placement. EXAM: PORTABLE ABDOMEN - 1 VIEW COMPARISON:  December 02, 2020. FINDINGS: The bowel gas pattern is normal. Distal tip of feeding tube is seen in expected position of distal stomach. No radio-opaque calculi or other significant radiographic abnormality are seen. IMPRESSION: Distal tip of feeding tube seen in expected position of distal stomach. Electronically Signed   By: Marijo Conception M.D.   On: 12/04/2020 11:16   DG Abd Portable 1V  Result Date: 12/02/2020 CLINICAL DATA:  Diarrhea and emesis, small-bowel obstruction EXAM: PORTABLE ABDOMEN - 1 VIEW COMPARISON:  Abdominal radiographs 11/30/2020, CT abdomen/pelvis 11/29/2020 FINDINGS: There are gas distended loops of small bowel throughout the abdomen measuring up to 4.9 cm in diameter. The appearance is overall similar to the prior radiograph. There is no definite free intraperitoneal air, though evaluation is limited by single supine radiograph. There is no gross organomegaly or abnormal soft tissue calcification. There is degenerative change of the lumbar spine. IMPRESSION: Gas distended loops of small bowel throughout the abdomen measuring up to 4.9 cm, grossly similar to the prior radiograph. Electronically Signed   By: Valetta Mole M.D.   On: 12/02/2020 13:28   DG Swallowing Func-Speech Pathology  Result Date: 12/17/2020 Table formatting from the original result was not included. Objective Swallowing Evaluation: Type of Study: MBS-Modified Barium Swallow Study  Patient Details Name: Albert Garrison MRN: [id number] Date of Birth: [birth year] Today's Date: 12/17/2020 Time: SLP Start Time (ACUTE ONLY): 4818 -SLP Stop Time (ACUTE ONLY): 5631 SLP Time Calculation (min) (ACUTE ONLY): 15 min Past Medical History: Past Medical History: Diagnosis Date  Arthritis   BPH (benign prostatic hyperplasia)   CKD (chronic kidney disease), stage II    Complicated UTI (urinary tract infection) 03/2014  Depression   GERD (gastroesophageal reflux disease)   History of pneumonia   History of stroke   HOH (hard of hearing)   Hypertension   Hyponatremia   Lower GI bleed 2017  a. ? due to polyp.  NSVT (nonsustained ventricular tachycardia) (HCC)   Rheumatic fever   Childhood  Sleep apnea   Does not use CPAP  Type 2 diabetes mellitus ([place])  Past Surgical History: Past Surgical History: Procedure Laterality Date  CARPAL TUNNEL RELEASE Left 2010  CARPAL TUNNEL RELEASE Right 07/13/2017  Procedure: CARPAL TUNNEL RELEASE;  Surgeon: Carole Civil, MD;  Location: [facility];  Service: Orthopedics;  Laterality: Right;  CERVICAL SPINE SURGERY  2010  COLONOSCOPY WITH PROPOFOL N/A 11/30/2015  Procedure: COLONOSCOPY WITH PROPOFOL;  Surgeon: Daneil Dolin, MD;  Location: [facility];  Service: Endoscopy;  Laterality: N/A;  KNEE ARTHROSCOPY  [place]  Nose surgery - broken nose  POLYPECTOMY  11/30/2015  Procedure: POLYPECTOMY;  Surgeon: Daneil Dolin, MD;  Location: [facility];  Service: Endoscopy;;  polyp at ascending colon, rectal polyp  TRANSURETHRAL INCISION OF PROSTATE N/A 01/20/2015  Procedure: TRANSURETHRAL INCISION OF THE PROSTATE (TUIP);  Surgeon: Irine Seal, MD;  Location: [facility];  Service: Urology;  Laterality: N/A; HPI: 75 y.o. male presented to [facility] ED with diarrhea, decreased p.o. intake and nausea. Pt admitted with acute encephalopathy and symptomatic hyponatremia. Abdominal distension 8/27. CT abd/pelvis (8/28) (+) SBO and NGT placed. 8/28 pt went into cardiac arrest, concern for possible STEMI. Transferred to [facility] for further treatment. CT head (8/28) revealed no acute intracranial abnormality. Chest xray (8/30) noted "appearance of airspace disease in the right middle lobe. Small left pleural effusion". Hosptial admission complicated by continued impulsivity and agitation with pt pulling out multiple NGT's despite restraints.  ETT:  8/28-8/30. PMHx significant for CKD, BPH, DMII, Hx of CVA, HTN, and GERD. SLE (08/23/2017) revealed cognitive communication deficits post CVA.  Subjective: Pt seen in radiology for instrumental assessment of swallow function and safety. No family present. Assessment / Plan / Recommendation CHL IP CLINICAL IMPRESSIONS 12/17/2020 Clinical Impression Pt presents with ongoing dysphagia with several factors that could be persistently impacting effective swallowing. Pt has a prior ACDF with hardware present from C3-C6. His brother denies any knowledge of baseline impairment, but suspect pt may have had a mild change in swallowing since that surgery that he compensated for and can no longer discuss. He is noted to have decreased epiglottic deflection, with epiglottic movement restricted by posterior pharyngeal wall, keeping vestibule slightly open at the height of laryngeal elevation. Pt has instances of bolus penetratating the vestibule before hyoid excursion with thin and nectar thick liquids squeezing between the posterior process of the glottis during hyoid excursion. Pts sensation is absent and voice is slightly hoarse. Question the integrity of glottic competence given this finding. Pt was unable to sustain a chin tuck or achieve any other strategies gievn mentation and decreased neck ROM. Recommend pt continue a finely chopped diet given rapid intake of solids with partial mastication and spillage of solids to lower pharynx during ongoing masticaiton. Pt should also continue honey thick liquids. Discussed findings with brother. Recommend a repeat MBS a few weeks after NG tube removal for potential upgrade as presence of NG could be another complicating factor. If there is no improvement in airway protection with liquids after another 14 days approximately, would recommend referral to ENT to examine larynx directly. SLP Visit Diagnosis Dysphagia, oropharyngeal phase (R13.12) Attention and concentration deficit  following -- Frontal lobe and executive function deficit following -- Impact on safety and function Moderate aspiration risk   CHL IP TREATMENT RECOMMENDATION 12/17/2020 Treatment Recommendations Therapy as outlined in treatment plan below   Prognosis 12/17/2020 Prognosis for Safe Diet Advancement Good Barriers to Reach Goals -- Barriers/Prognosis Comment -- CHL IP DIET RECOMMENDATION 12/17/2020 SLP Diet Recommendations Dysphagia 2 (Fine chop) solids;Honey thick liquids Liquid Administration via Cup;Straw;Spoon Medication Administration Crushed with puree Compensations Minimize environmental distractions;Slow rate;Small sips/bites Postural Changes Remain semi-upright after after feeds/meals (Comment)   CHL IP OTHER RECOMMENDATIONS 12/17/2020 Recommended Consults -- Oral Care Recommendations Oral care BID Other Recommendations --   CHL IP FOLLOW UP RECOMMENDATIONS 12/17/2020 Follow up Recommendations Skilled Nursing facility   [facility] IP FREQUENCY AND DURATION 12/17/2020 Speech Therapy Frequency (ACUTE ONLY) min 2x/week Treatment Duration 2 weeks      CHL IP ORAL PHASE 12/17/2020 Oral Phase Impaired Oral -  Pudding Teaspoon -- Oral - Pudding Cup -- Oral - Honey Teaspoon NT Oral - Honey Cup WFL Oral - Nectar Teaspoon WFL Oral - Nectar Cup WFL Oral - Nectar Straw WFL Oral - Thin Teaspoon -- Oral - Thin Cup WFL Oral - Thin Straw WFL Oral - Puree WFL Oral - Mech Soft -- Oral - Regular Premature spillage;Decreased bolus cohesion;Delayed oral transit;Holding of bolus Oral - Multi-Consistency -- Oral - Pill -- Oral Phase - Comment --  CHL IP PHARYNGEAL PHASE 12/17/2020 Pharyngeal Phase Impaired Pharyngeal- Pudding Teaspoon -- Pharyngeal -- Pharyngeal- Pudding Cup -- Pharyngeal -- Pharyngeal- Honey Teaspoon NT Pharyngeal -- Pharyngeal- Honey Cup WFL Pharyngeal -- Pharyngeal- Nectar Teaspoon NT Pharyngeal -- Pharyngeal- Nectar Cup Penetration/Aspiration before swallow;Penetration/Aspiration during swallow;Reduced epiglottic  inversion;Reduced airway/laryngeal closure;Trace aspiration Pharyngeal Material enters airway, CONTACTS cords and not ejected out;Material enters airway, passes BELOW cords without attempt by patient to eject out (silent aspiration);Material enters airway, passes BELOW cords then ejected out Pharyngeal- Nectar Straw Penetration/Aspiration before swallow;Penetration/Aspiration during swallow;Reduced epiglottic inversion;Reduced airway/laryngeal closure;Trace aspiration Pharyngeal Material enters airway, CONTACTS cords and not ejected out;Material enters airway, passes BELOW cords without attempt by patient to eject out (silent aspiration) Pharyngeal- Thin Teaspoon -- Pharyngeal -- Pharyngeal- Thin Cup Reduced epiglottic inversion;Penetration/Aspiration before swallow;Penetration/Aspiration during swallow;Trace aspiration;Moderate aspiration;Reduced airway/laryngeal closure Pharyngeal Material enters airway, passes BELOW cords without attempt by patient to eject out (silent aspiration);Material enters airway, CONTACTS cords and then ejected out;Material enters airway, CONTACTS cords and not ejected out Pharyngeal- Thin Straw Reduced epiglottic inversion;Penetration/Aspiration before swallow;Penetration/Aspiration during swallow;Trace aspiration;Moderate aspiration;Reduced airway/laryngeal closure Pharyngeal Material enters airway, passes BELOW cords without attempt by patient to eject out (silent aspiration);Material enters airway, CONTACTS cords and then ejected out;Material enters airway, CONTACTS cords and not ejected out Pharyngeal- Puree WFL Pharyngeal -- Pharyngeal- Mechanical Soft -- Pharyngeal -- Pharyngeal- Regular Pharyngeal residue - valleculae;Pharyngeal residue - pyriform Pharyngeal -- Pharyngeal- Multi-consistency -- Pharyngeal -- Pharyngeal- Pill -- Pharyngeal -- Pharyngeal Comment --  CHL IP CERVICAL ESOPHAGEAL PHASE 12/09/2020 Cervical Esophageal Phase WFL Pudding Teaspoon -- Pudding Cup -- Honey  Teaspoon -- Honey Cup -- Nectar Teaspoon -- Nectar Cup -- Nectar Straw -- Thin Teaspoon -- Thin Cup -- Thin Straw -- Puree -- Mechanical Soft -- Regular -- Multi-consistency -- Pill -- Cervical Esophageal Comment -- Lynann Beaver 12/17/2020, 11:52 AM              DG Swallowing Func-Speech Pathology  Result Date: 12/09/2020 Table formatting from the original result was not included. Objective Swallowing Evaluation: Type of Study: MBS-Modified Barium Swallow Study  Patient Details Name: Albert Garrison MRN: [id number] Date of Birth: March 29, 1946 Today's Date: 12/09/2020 Time: SLP Start Time (ACUTE ONLY): 1300 -SLP Stop Time (ACUTE ONLY): 1325 SLP Time Calculation (min) (ACUTE ONLY): 25 min Past Medical History: Past Medical History: Diagnosis Date  Arthritis   BPH (benign prostatic hyperplasia)   CKD (chronic kidney disease), stage II   Complicated UTI (urinary tract infection) 03/2014  Depression   GERD (gastroesophageal reflux disease)   History of pneumonia   History of stroke   HOH (hard of hearing)   Hypertension   Hyponatremia   Lower GI bleed 2017  a. ? due to polyp.  NSVT (nonsustained ventricular tachycardia) (HCC)   Rheumatic fever   Childhood  Sleep apnea   Does not use CPAP  Type 2 diabetes mellitus ([place])  Past Surgical History: Past Surgical History: Procedure Laterality Date  CARPAL TUNNEL RELEASE Left 2010  CARPAL TUNNEL RELEASE Right 07/13/2017  Procedure: CARPAL TUNNEL RELEASE;  Surgeon: Carole Civil, MD;  Location: [facility];  Service: Orthopedics;  Laterality: Right;  CERVICAL SPINE SURGERY  2010  COLONOSCOPY WITH PROPOFOL N/A 11/30/2015  Procedure: COLONOSCOPY WITH PROPOFOL;  Surgeon: Daneil Dolin, MD;  Location: [facility];  Service: Endoscopy;  Laterality: N/A;  KNEE ARTHROSCOPY  [place] surgery - broken nose    POLYPECTOMY  11/30/2015  Procedure: POLYPECTOMY;  Surgeon: Daneil Dolin, MD;  Location: [facility];  Service: Endoscopy;;  polyp at  ascending colon, rectal polyp  TRANSURETHRAL INCISION OF PROSTATE N/A 01/20/2015  Procedure: TRANSURETHRAL INCISION OF THE PROSTATE (TUIP);  Surgeon: Irine Seal, MD;  Location: [facility];  Service: Urology;  Laterality: N/A; HPI: 75 y.o. male presented [facility] ED with diarrhea, decreased p.o. intake and nausea. Pt admitted with acute encephalopathy and symptomatic hyponatremia. Abdominal distension 8/27. CT abd/pelvis (8/28) (+) SBO and NGT placed. 8/28 pt went into cardiac arrest, concern for possible STEMI. Transferred to [facility] for further treatment. CT head (8/28) revealed no acute intracranial abnormality. Chest xray (8/30) noted "appearance of airspace disease in the right middle lobe. Small left pleural effusion". Hosptial admission complicated by continued impulsivity and agitation with pt pulling out multiple NGT's despite restraints.  ETT: 8/28-8/30. PMHx significant for CKD, BPH, DMII, Hx of CVA, HTN, and GERD. SLE (08/23/2017) revealed cognitive communication deficits post CVA.  Subjective: Pt seen in radiology for instrumental assessment of swallow function and safety. No family present. Assessment / Plan / Recommendation CHL IP CLINICAL IMPRESSIONS 12/09/2020 Clinical Impression Pt presents with a moderate-severe oral and pharyngeal dysphagia, characterized as follows: Orally, pt demonstrates poor bolus formation and control, with premature spillage over the tongue base across consistencies. Pt was noted to piecemeal several boluses, swallowing small amounts of the bolus at one time. Oral residue was also noted, which then spilled posteriorly into the vallecular sinus. Pharyngeal swallow is characterized by delayed swallow reflex, with trigger occurring at the level of the pyriform sinuses on nectar and honey thick liquids (cup sip), and at the vallecular sinus on honey (tsp) and puree. Epiglottic inversion was reduced, in large part by the presence of the Cortrak feeding tube. This resulted in vallecular  residue across consistencies. This increases aspiration risk, as residue thins with saliva, or is added to by subsequent boluses and can potentially spill over the vallecular sinus and into the open airway. SILENT ASPIRATION was seen on nectar thick liquids via teaspoon and cup sip. Flash penetration was noted during the swallow of honey thick liquids and puree. Pt was noted to be impulsive with cup sips, and has difficulty following commands due to confusion and hearing loss. Pt is at significantly high risk of aspiration of any consistency given. Puree and honey thick liquids may be presented in small bites/sips with 1:1 close supervision. Meds should be crushed in puree or given via Cortrak.  SLP Visit Diagnosis Dysphagia, oropharyngeal phase (R13.12)     Impact on safety and function Severe aspiration risk   CHL IP TREATMENT RECOMMENDATION 12/09/2020 Treatment Recommendations Therapy as outlined in treatment plan below   Prognosis 12/09/2020 Prognosis for Safe Diet Advancement Fair Barriers to Reach Goals Cognitive deficits   CHL IP DIET RECOMMENDATION 12/09/2020 SLP Diet Recommendations Dysphagia 1 (Puree) solids;Honey thick liquids Liquid Administration via Cup;No straw;Spoon Medication Administration Crushed with puree Compensations Minimize environmental distractions;Slow rate;Small sips/bites Postural Changes Seated upright at 90 degrees;Remain semi-upright after after feeds/meals (Comment)  CHL IP OTHER RECOMMENDATIONS 12/09/2020   Oral Care Recommendations Oral care QID Other Recommendations Order thickener from pharmacy   CHL IP FOLLOW UP RECOMMENDATIONS 12/09/2020 Follow up Recommendations TBD   CHL IP FREQUENCY AND DURATION 12/09/2020 Speech Therapy Frequency (ACUTE ONLY) min 2x/week Treatment Duration 2 weeks      CHL IP ORAL PHASE 12/09/2020 Oral Phase Impaired  Oral - Honey Teaspoon Premature spillage;Other (Comment) Oral - Honey Cup Premature spillage;Other (Comment);Piecemeal swallowing Oral - Nectar Teaspoon  Other (Comment);Premature spillage Oral - Nectar Cup Premature spillage;Other (Comment);Piecemeal swallowing Oral - Puree Piecemeal swallowing;Premature spillage   CHL IP PHARYNGEAL PHASE 12/09/2020 Pharyngeal Phase Impaired Pharyngeal- Honey Teaspoon Delayed swallow initiation-vallecula;Pharyngeal residue - valleculae;Penetration/Aspiration during swallow Pharyngeal Material enters airway, remains ABOVE vocal cords then ejected out Pharyngeal- Honey Cup Delayed swallow initiation-pyriform sinuses;Pharyngeal residue - valleculae;Penetration/Aspiration during swallow Pharyngeal Material enters airway, remains ABOVE vocal cords then ejected out Pharyngeal- Nectar Teaspoon Delayed swallow initiation-pyriform sinuses;Pharyngeal residue - valleculae;Penetration/Aspiration during swallow Pharyngeal Material enters airway, passes BELOW cords without attempt by patient to eject out (silent aspiration) Pharyngeal- Nectar Cup Delayed swallow initiation-pyriform sinuses;Pharyngeal residue - valleculae;Penetration/Aspiration during swallow Pharyngeal Material enters airway, passes BELOW cords without attempt by patient to eject out (silent aspiration) Pharyngeal- Puree Delayed swallow initiation-vallecula;Pharyngeal residue - valleculae;Penetration/Aspiration during swallow Pharyngeal Material enters airway, remains ABOVE vocal cords and not ejected out  CHL IP CERVICAL ESOPHAGEAL PHASE 12/09/2020 Cervical Esophageal Phase [facility] Celia B. [name], [facility], CCC-SLP Speech Language Pathologist Office: [phone] Shonna Chock 12/09/2020, 2:00 PM              ECHOCARDIOGRAM COMPLETE  Result Date: 11/28/2020    ECHOCARDIOGRAM REPORT   Patient Name:   JAMIAH RECORE Date of Exam: 11/28/2020 Medical Rec #:  [id number]       Height:       71.0 in Accession #:    [id number]      Weight:       195.1 lb Date of Birth:  Jul 10, 1945       BSA:          2.086 m Patient Age:    4 years        BP:           167/82 mmHg Patient Gender: M                HR:           65 bpm. Exam Location:  [facility] Procedure: 2D Echo, Cardiac Doppler and Color Doppler Indications:    R94.31 Abnormal EKG  History:        Patient has prior history of Echocardiogram examinations, most                 recent 08/23/2017. Risk Factors:Diabetes and Hypertension.  Sonographer:    Bernadene Person RDCS Referring Phys: [place]  1. Left ventricular ejection fraction, by estimation, is 55 to 60%. The left ventricle has normal function. The left ventricle has no regional wall motion abnormalities. There is moderate concentric left ventricular hypertrophy. Left ventricular diastolic parameters are consistent with Grade I diastolic dysfunction (impaired relaxation).  2. Right ventricular systolic function is normal. The right ventricular size is normal. Tricuspid regurgitation signal is inadequate for assessing PA pressure.  3. The mitral valve is normal in structure. Trivial mitral valve regurgitation. No evidence of mitral stenosis.  4. The aortic valve is tricuspid. Aortic valve regurgitation is mild.  5. Aortic dilatation noted. There is mild  dilatation of the aortic root, measuring 43 mm. Comparison(s): A prior study was performed on 08/23/2017. Slight increase in aortic root. FINDINGS  Left Ventricle: Left ventricular ejection fraction, by estimation, is 55 to 60%. The left ventricle has normal function. The left ventricle has no regional wall motion abnormalities. The left ventricular internal cavity size was normal in size. There is  moderate concentric left ventricular hypertrophy. Left ventricular diastolic parameters are consistent with Grade I diastolic dysfunction (impaired relaxation). Right Ventricle: The right ventricular size is normal. No increase in right ventricular wall thickness. Right ventricular systolic function is normal. Tricuspid regurgitation signal is inadequate for assessing PA pressure. Left Atrium: Left atrial size was normal in  size. Right Atrium: Right atrial size was normal in size. Pericardium: There is no evidence of pericardial effusion. Mitral Valve: The mitral valve is normal in structure. Trivial mitral valve regurgitation. No evidence of mitral valve stenosis. Tricuspid Valve: The tricuspid valve is normal in structure. Tricuspid valve regurgitation is not demonstrated. Aortic Valve: The aortic valve is tricuspid. Aortic valve regurgitation is mild. Aortic regurgitation PHT measures 1048 msec. Pulmonic Valve: The pulmonic valve was not well visualized. Pulmonic valve regurgitation is mild. No evidence of pulmonic stenosis. Aorta: Aortic dilatation noted. There is mild dilatation of the aortic root, measuring 43 mm. IAS/Shunts: The atrial septum is grossly normal.  LEFT VENTRICLE PLAX 2D LVIDd:         4.97 cm  Diastology LVIDs:         3.34 cm  LV e' medial:    5.40 cm/s LV PW:         1.38 cm  LV E/e' medial:  5.9 LV IVS:        1.39 cm  LV e' lateral:   6.23 cm/s LVOT diam:     2.20 cm  LV E/e' lateral: 5.1 LV SV:         100 LV SV Index:   48 LVOT Area:     3.80 cm  RIGHT VENTRICLE RV S prime:     14.30 cm/s TAPSE (M-mode): 1.9 cm LEFT ATRIUM             Index       RIGHT ATRIUM          Index LA diam:        2.50 cm 1.20 cm/m  RA Area:     8.74 cm LA Vol (A2C):   33.2 ml 15.91 ml/m RA Volume:   11.90 ml 5.70 ml/m LA Vol (A4C):   32.7 ml 15.67 ml/m LA Biplane Vol: 33.5 ml 16.06 ml/m  AORTIC VALVE LVOT Vmax:   108.00 cm/s LVOT Vmean:  68.500 cm/s LVOT VTI:    0.262 m AI PHT:      1048 msec  AORTA Ao Root diam: 4.30 cm Ao Asc diam:  3.80 cm MITRAL VALVE MV Area (PHT): 1.87 cm    SHUNTS MV Decel Time: 405 msec    Systemic VTI:  0.26 m MV E velocity: 31.70 cm/s  Systemic Diam: 2.20 cm MV A velocity: 72.00 cm/s MV E/A ratio:  0.44 Rudean Haskell MD Electronically signed by Rudean Haskell MD Signature Date/Time: 11/28/2020/3:42:26 PM    Final    ECHOCARDIOGRAM LIMITED  Result Date: 11/30/2020    ECHOCARDIOGRAM  LIMITED REPORT   Patient Name:   Albert Garrison Date of Exam: 11/30/2020 Medical Rec #:  [id number]       Height:       71.0  in Accession #:    [id number]      Weight:       198.2 lb Date of Birth:  1945-11-17       BSA:          2.100 m Patient Age:    48 years        BP:           106/54 mmHg Patient Gender: M               HR:           66 bpm. Exam Location:  [facility] Procedure: Limited Echo, Cardiac Doppler and Limited Color Doppler Indications:    eval LVEF  History:        Patient has prior history of Echocardiogram examinations. Risk                 Factors:Diabetes and Hypertension. Ventricular Tachycardia                 I47.2.  Sonographer:    Alvino Chapel RCS Referring Phys: [id number] [facility]  Sonographer Comments: Patient on mechanical ventilator at time of echo. IMPRESSIONS  1. Left ventricular ejection fraction, by estimation, is 65 to 70%. The left ventricle has normal function. The left ventricle has no regional wall motion abnormalities. There is mild left ventricular hypertrophy. Left ventricular diastolic parameters are indeterminate.  2. Right ventricular systolic function is normal. The right ventricular size is normal. Tricuspid regurgitation signal is inadequate for assessing PA pressure.  3. The mitral valve is grossly normal.  4. Aortic valve regurgitation is mild.  5. Aortic dilatation noted. There is mild dilatation of the aortic root, measuring 42 mm. Comparison(s): Prior images reviewed side by side. Limited study. LVEF remains normal range. FINDINGS  Left Ventricle: Left ventricular ejection fraction, by estimation, is 65 to 70%. The left ventricle has normal function. The left ventricle has no regional wall motion abnormalities. The left ventricular internal cavity size was normal in size. There is  mild left ventricular hypertrophy. Left ventricular diastolic parameters are indeterminate. Right Ventricle: The right ventricular size is normal. No increase in right ventricular  wall thickness. Right ventricular systolic function is normal. Tricuspid regurgitation signal is inadequate for assessing PA pressure. Pericardium: Presence of pericardial fat pad. Mitral Valve: The mitral valve is grossly normal. There is mild calcification of the mitral valve leaflet(s). Aortic Valve: Aortic valve regurgitation is mild. Aorta: Aortic dilatation noted. There is mild dilatation of the aortic root, measuring 42 mm. Venous: IVC assessment for right atrial pressure unable to be performed due to mechanical ventilation. IAS/Shunts: No atrial level shunt detected by color flow Doppler. LEFT VENTRICLE PLAX 2D LVIDd:         4.40 cm LVIDs:         2.70 cm LV PW:         1.30 cm LV IVS:        1.30 cm LVOT diam:     2.30 cm LVOT Area:     4.15 cm  LEFT ATRIUM         Index LA diam:    2.00 cm 0.95 cm/m   AORTA Ao Root diam: 4.20 cm MITRAL VALVE MV Area (PHT): 2.33 cm    SHUNTS MV Decel Time: 326 msec    Systemic Diam: 2.30 cm MV E velocity: 62.10 cm/s MV A velocity: 82.70 cm/s MV E/A ratio:  0.75 Rozann Lesches MD Electronically signed by Mikeal Hawthorne  Mcdowell MD Signature Date/Time: 11/30/2020/2:13:35 PM    Final    [place] EKG SITE RITE  Result Date: 12/03/2020 If Site Rite image not attached, placement could not be confirmed due to current cardiac rhythm.    The results of significant diagnostics from this hospitalization (including imaging, microbiology, ancillary and laboratory) are listed below for reference.     Microbiology: No results found for this or any previous visit (from the past 240 hour(s)).   Labs: BNP (last 3 results) No results for input(s): BNP in the last 8760 hours. Basic Metabolic Panel: Recent Labs  Lab 12/17/20 0420 12/18/20 0250 12/19/20 0302  NA 142 137 136  K 4.4 4.5 4.1  CL 106 101 102  CO2 24 24 23   GLUCOSE 119* 223* 192*  BUN 34* 35* 28*  CREATININE 1.26* 1.26* 1.27*  CALCIUM 8.9 8.9 8.5*  MG 2.3  --   --   PHOS 4.1  --   --    Liver Function  Tests: Recent Labs  Lab 12/17/20 0420  AST 19  ALT 21  ALKPHOS 73  BILITOT 0.7  PROT 6.4*  ALBUMIN 2.8*   No results for input(s): LIPASE, AMYLASE in the last 168 hours. No results for input(s): AMMONIA in the last 168 hours. CBC: Recent Labs  Lab 12/19/20 0302  WBC 7.8  HGB 11.8*  HCT 35.4*  MCV 89.8  PLT 428*   Cardiac Enzymes: No results for input(s): CKTOTAL, CKMB, CKMBINDEX, TROPONINI in the last 168 hours. BNP: Invalid input(s): POCBNP CBG: Recent Labs  Lab 12/22/20 1100 12/22/20 1544 12/22/20 2027 12/23/20 0607 12/23/20 1107  GLUCAP 208* 234* 233* 246* 236*   D-Dimer No results for input(s): DDIMER in the last 72 hours. Hgb A1c No results for input(s): HGBA1C in the last 72 hours. Lipid Profile No results for input(s): CHOL, HDL, LDLCALC, TRIG, CHOLHDL, LDLDIRECT in the last 72 hours. Thyroid function studies No results for input(s): TSH, T4TOTAL, T3FREE, THYROIDAB in the last 72 hours.  Invalid input(s): FREET3 Anemia work up No results for input(s): VITAMINB12, FOLATE, FERRITIN, TIBC, IRON, RETICCTPCT in the last 72 hours. Urinalysis    Component Value Date/Time   COLORURINE YELLOW 11/27/2020 1035   APPEARANCEUR CLEAR 11/27/2020 1035   LABSPEC 1.016 11/27/2020 1035   PHURINE 6.0 11/27/2020 1035   GLUCOSEU >=500 (A) 11/27/2020 1035   HGBUR SMALL (A) 11/27/2020 1035   BILIRUBINUR NEGATIVE 11/27/2020 1035   KETONESUR 20 (A) 11/27/2020 1035   PROTEINUR 30 (A) 11/27/2020 1035   UROBILINOGEN 0.2 03/24/2014 1320   NITRITE NEGATIVE 11/27/2020 1035   LEUKOCYTESUR NEGATIVE 11/27/2020 1035   Sepsis Labs Invalid input(s): PROCALCITONIN,  WBC,  LACTICIDVEN Microbiology No results found for this or any previous visit (from the past 240 hour(s)).   Time coordinating discharge in minutes: 65  SIGNED:   Mercy Riding, MD  [facility] 12/23/2020, 11:44 AM

## 2020-12-22 NOTE — TOC Progression Note (Signed)
Transition of Care [facility]) - Progression Note    Patient Details  Name: THEODORE VIRGIN MRN: [id number] Date of Birth: 1946-03-08  Transition of [facility] [place]) CM/SW [place], [place] Phone Number: 12/22/2020, 2:45 PM  Clinical Narrative:    CSW spoke with pt brother who agrees to send pt to [place]. CSW began pt auth and contacted Debbie at [facility] to inform her of pt DC and [place]. Debbie asked if pt can DC tomorrow since we are still waiting on auth and covid test results. CSW will continue to follow for pt DC.   Expected Discharge Plan: [place] Barriers to Discharge: Continued Medical Work up  Expected Discharge Plan and Services Expected Discharge Plan: [place] arrangements for the past 2 months: [place] Expected Discharge Date: 12/22/20                                     Social Determinants of Health (SDOH) Interventions    Readmission Risk Interventions No flowsheet data found.

## 2020-12-22 NOTE — Progress Notes (Signed)
Speech Language Pathology Treatment: Dysphagia  Patient Details Name: Albert Garrison MRN: [id number] DOB: 17-May-1945 Today's Date: 12/22/2020 Time: 1240-1250 SLP Time Calculation (min) (ACUTE ONLY): 10 min  Assessment / Plan / Recommendation Clinical Impression  Pt seen at bedside during lunch to assess tolerance of dys 2 solids and honey thick liquids. Pt was sleeping upon arrival of SLP, but awakened easily with verbal stim. Pt was positioned upright for PO intake. Pt required significant cueing to accept minimal PO trials. He refused all dys 2 solid offerings, but accepted sips of honey thick liquid. No overt s/s aspiration observed. Pt is slated for DC today to SNF. Continued ST intervention is recommended to maximize swallow safety and continue efforts to advance solid and liquid textures.   HPI HPI: 75 y.o. male presented to [facility] ED with diarrhea, decreased p.o. intake and nausea. Pt admitted with acute encephalopathy and symptomatic hyponatremia. Abdominal distension 8/27. CT abd/pelvis (8/28) (+) SBO and NGT placed. 8/28 pt went into cardiac arrest, concern for possible STEMI. Transferred to [facility] for further treatment. CT head (8/28) revealed no acute intracranial abnormality. Chest xray (8/30) noted "appearance of airspace disease in the right middle lobe. Small left pleural effusion". Hosptial admission complicated by continued impulsivity and agitation with pt pulling out multiple NGT's despite restraints.  ETT: 8/28-8/30. PMHx significant for CKD, BPH, DMII, Hx of CVA, HTN, and GERD. SLE (08/23/2017) revealed cognitive communication deficits post CVA.      SLP Plan  Continue with current plan of care      Recommendations for follow up therapy are one component of a multi-disciplinary discharge planning process, led by the attending physician.  Recommendations may be updated based on patient status, additional functional criteria and insurance authorization.    Recommendations  Diet  recommendations: Dysphagia 2 (fine chop);Honey-thick liquid Liquids provided via: Cup;No straw Medication Administration: Via alternative means Supervision: Full supervision/cueing for compensatory strategies Compensations: Minimize environmental distractions;Slow rate;Small sips/bites Postural Changes and/or Swallow Maneuvers: Seated upright 90 degrees;Upright 30-60 min after meal                Oral Care Recommendations: Oral care BID;Staff/trained caregiver to provide oral care Follow up Recommendations: Skilled Nursing facility SLP Visit Diagnosis: Dysphagia, oropharyngeal phase (R13.12) Plan: Continue with current plan of care       GO               Constancia Geeting B. [name], [facility], [place] Speech Language Pathologist Office: [phone]  Shonna Chock  12/22/2020, 1:00 PM

## 2020-12-22 NOTE — Progress Notes (Signed)
Occupational Therapy Treatment Patient Details Name: Albert Garrison MRN: [id number] DOB: July 14, 1945 Today's Date: 12/22/2020   History of present illness Pt is a 75 y.o. admitted 11/27/20 with decreased po intake, nausea; workup for acute encephalopathy, hyponatremia. Pt with SBO 8/27, s/p NGT placement. Course complicated by AMS. CODE BLUE called 8/28; ROSC achieved after several minutes of CPR; suspect aspiration during cardiac arrest. Concern for STEMI. Cardiology unsafe to proceed with cardiac cath 9/6. ETT 8/28-8/30. PMH includes CKD, BPH, DM2, CVA, HTN, GERD.   OT comments  OT treatment session with focus on self-care re-education, sitting/standing balance, functional transfers and functional cognition. Patient oriented to person only. Able to identify "hospital" with multiple choice cues. Patient disoriented to time and situation. Unable to count backwards from 20 to 0 despite max cues. Patient making steady progress toward goals requiring +2 assist at time of initial evaluation for functional transfers. Patient currently requires Min A and HHA +1 for sit to stand transfers. Meal tray in room untouched upon entry. With set-up, patient consumed several bites of food on tray and thickened coffee/tea. Able to make needs known concerning food preferences of available items present on tray. Requires Min to Mod A grossly for all other ADLs. Patient would benefit from continue acute OT services in prep for safe d/c to next level of care. OT will continue to follow acutely.    Recommendations for follow up therapy are one component of a multi-disciplinary discharge planning process, led by the attending physician.  Recommendations may be updated based on patient status, additional functional criteria and insurance authorization.    Follow Up Recommendations  SNF;Supervision/Assistance - 24 hour    Equipment Recommendations  Other (comment) (Defer to next level of care.)    Recommendations for Other  Services      Precautions / Restrictions Precautions Precautions: Fall Restrictions Weight Bearing Restrictions: No       Mobility Bed Mobility Overal bed mobility: Needs Assistance Bed Mobility: Supine to Sit     Supine to sit: Supervision;HOB elevated     General bed mobility comments: Supervision A for safety. No external assist needed.    Transfers Overall transfer level: Needs assistance Equipment used: 1 person hand held assist Transfers: Sit to/from Stand Sit to Stand: Min assist Stand pivot transfers: Min assist       General transfer comment: Min A for steadying. Cues for hand placement.    Balance Overall balance assessment: Needs assistance Sitting-balance support: Feet supported Sitting balance-Leahy Scale: Fair Sitting balance - Comments: Did not challenge   Standing balance support: Single extremity supported;During functional activity Standing balance-Leahy Scale: Poor Standing balance comment: Reliant on UE support to maintain static balance; external assist to maintain dynamic balance; unable to accept challenge                           ADL either performed or assessed with clinical judgement   ADL Overall ADL's : Needs assistance/impaired Eating/Feeding: Sitting;Set up Eating/Feeding Details (indicate cue type and reason): at EOB     Upper Body Bathing: Minimal assistance;Sitting   Lower Body Bathing: Minimal assistance;Sit to/from stand   Upper Body Dressing : Minimal assistance;Sitting   Lower Body Dressing: Moderate assistance;Sit to/from stand Lower Body Dressing Details (indicate cue type and reason): Able to don slip on shoes seated EOB with set-up assist. Toilet Transfer: Minimal assistance Toilet Transfer Details (indicate cue type and reason): Simulated with transfer to recliner without use of  AD.                 Vision       Perception     Praxis      Cognition Arousal/Alertness:  Awake/alert Behavior During Therapy: Impulsive Overall Cognitive Status: No family/caregiver present to determine baseline cognitive functioning Area of Impairment: Orientation;Attention;Memory;Following commands;Safety/judgement;Awareness;Problem solving                 Orientation Level: Disoriented to;Place;Time;Situation Current Attention Level: Focused;Sustained Memory: Decreased recall of precautions;Decreased short-term memory Following Commands: Follows one step commands inconsistently;Follows one step commands with increased time Safety/Judgement: Decreased awareness of deficits;Decreased awareness of safety Awareness: Intellectual Problem Solving: Requires verbal cues;Requires tactile cues General Comments: Pleasantly confused throughout session, very poor attention; suspect impaired cognition exacerbated by [facility]        Exercises     Shoulder Instructions       General Comments VSS on RA.    Pertinent Vitals/ Pain       Pain Assessment: No/denies pain  Home Living                                          Prior Functioning/Environment              Frequency  Min 2X/week        Progress Toward Goals  OT Goals(current goals can now be found in the care plan section)  Progress towards OT goals: Progressing toward goals  Acute Rehab OT Goals Patient Stated Goal: none stated OT Goal Formulation: Patient unable to participate in goal setting ADL Goals Pt Will Perform Grooming: with modified independence;standing Pt Will Perform Upper Body Dressing: Independently Pt Will Perform Lower Body Dressing: with modified independence;sit to/from stand Pt Will Transfer to Toilet: with modified independence;ambulating Pt Will Perform Toileting - Clothing Manipulation and hygiene: with modified independence;sit to/from stand Additional ADL Goal #1: Patient will follow 1-2 step verbal commands with 95% accuracy in prep for ADLs. Additional ADL  Goal #2: Patient will score <4/28 on SBT indicating increased cognition in prep for safe return to PLOF.  Plan Discharge plan remains appropriate;Frequency remains appropriate    Co-evaluation                 AM-PAC OT "6 Clicks" Daily Activity     Outcome Measure   Help from another person eating meals?: A Little Help from another person taking care of personal grooming?: A Little Help from another person toileting, which includes using toliet, bedpan, or urinal?: A Lot Help from another person bathing (including washing, rinsing, drying)?: A Lot Help from another person to put on and taking off regular upper body clothing?: A Little Help from another person to put on and taking off regular lower body clothing?: A Lot 6 Click Score: 15    End of Session Equipment Utilized During Treatment: Gait belt;Rolling walker  OT Visit Diagnosis: Unsteadiness on feet (R26.81);Muscle weakness (generalized) (M62.81);Other symptoms and signs involving cognitive function   Activity Tolerance Patient tolerated treatment well   Patient Left in chair;with call bell/phone within reach;with chair alarm set;with family/visitor present   Nurse Communication Mobility status        Time: 9381-8299 OT Time Calculation (min): 30 min  Charges: OT General Charges $OT Visit: 1 Visit OT Treatments $Self Care/Home Management : 23-37 mins  Azai Gaffin H. OTR/L Supplemental OT, Department of  rehab services [phone]  Jalaysia Lobb R H. 12/22/2020, 2:34 PM

## 2020-12-22 NOTE — Consult Note (Signed)
   [facility] Inpatient Consult   12/22/2020  Albert Garrison Jul 10, 1945 [id number]  [place] Organization [ACO] Patient:  [employer] Medicare  Primary Care Provider:  Celene Squibb, MD this office is listed to provide the transition of care calls and follow up appointments.  Follow up:  Long length of stay  Patient screened for length of stay hospitalization with inpatient [facility] team for potential [place] Management service needs for post hospital transition.  Review of patient's medical record reveals patient is has been recommended for a skilled nursing facility stay. Reviewed barriers for post hospital transition as  discuss for patient having a safe disposition.  Plan:  Continue to follow progress and disposition to assess for post hospital care management needs. If patient transitions to a skilled nursing level of care his post hospital needs can be met at that level.  If the patient transitions home [place] can follow.    For questions contact:   Natividad Brood, RN BSN [facility] Liaison  [phone] business mobile phone Toll free office [phone]  Fax number: [phone] [email or web address]

## 2020-12-23 DIAGNOSIS — R1311 Dysphagia, oral phase: Secondary | ICD-10-CM | POA: Diagnosis not present

## 2020-12-23 DIAGNOSIS — Z87891 Personal history of nicotine dependence: Secondary | ICD-10-CM | POA: Diagnosis not present

## 2020-12-23 DIAGNOSIS — R092 Respiratory arrest: Secondary | ICD-10-CM | POA: Diagnosis not present

## 2020-12-23 DIAGNOSIS — J9621 Acute and chronic respiratory failure with hypoxia: Secondary | ICD-10-CM | POA: Diagnosis not present

## 2020-12-23 DIAGNOSIS — R404 Transient alteration of awareness: Secondary | ICD-10-CM | POA: Diagnosis not present

## 2020-12-23 DIAGNOSIS — R509 Fever, unspecified: Secondary | ICD-10-CM | POA: Diagnosis not present

## 2020-12-23 DIAGNOSIS — I129 Hypertensive chronic kidney disease with stage 1 through stage 4 chronic kidney disease, or unspecified chronic kidney disease: Secondary | ICD-10-CM | POA: Diagnosis not present

## 2020-12-23 DIAGNOSIS — G9340 Encephalopathy, unspecified: Secondary | ICD-10-CM | POA: Diagnosis not present

## 2020-12-23 DIAGNOSIS — E785 Hyperlipidemia, unspecified: Secondary | ICD-10-CM | POA: Diagnosis not present

## 2020-12-23 DIAGNOSIS — M1990 Unspecified osteoarthritis, unspecified site: Secondary | ICD-10-CM | POA: Diagnosis not present

## 2020-12-23 DIAGNOSIS — E876 Hypokalemia: Secondary | ICD-10-CM | POA: Diagnosis not present

## 2020-12-23 DIAGNOSIS — Z794 Long term (current) use of insulin: Secondary | ICD-10-CM | POA: Diagnosis not present

## 2020-12-23 DIAGNOSIS — K56609 Unspecified intestinal obstruction, unspecified as to partial versus complete obstruction: Secondary | ICD-10-CM | POA: Diagnosis not present

## 2020-12-23 DIAGNOSIS — J9690 Respiratory failure, unspecified, unspecified whether with hypoxia or hypercapnia: Secondary | ICD-10-CM | POA: Diagnosis not present

## 2020-12-23 DIAGNOSIS — Z981 Arthrodesis status: Secondary | ICD-10-CM | POA: Diagnosis not present

## 2020-12-23 DIAGNOSIS — K219 Gastro-esophageal reflux disease without esophagitis: Secondary | ICD-10-CM | POA: Diagnosis not present

## 2020-12-23 DIAGNOSIS — I10 Essential (primary) hypertension: Secondary | ICD-10-CM | POA: Diagnosis not present

## 2020-12-23 DIAGNOSIS — Z7401 Bed confinement status: Secondary | ICD-10-CM | POA: Diagnosis not present

## 2020-12-23 DIAGNOSIS — H9190 Unspecified hearing loss, unspecified ear: Secondary | ICD-10-CM | POA: Diagnosis not present

## 2020-12-23 DIAGNOSIS — I462 Cardiac arrest due to underlying cardiac condition: Secondary | ICD-10-CM | POA: Diagnosis not present

## 2020-12-23 DIAGNOSIS — N183 Chronic kidney disease, stage 3 unspecified: Secondary | ICD-10-CM | POA: Diagnosis not present

## 2020-12-23 DIAGNOSIS — N401 Enlarged prostate with lower urinary tract symptoms: Secondary | ICD-10-CM | POA: Diagnosis present

## 2020-12-23 DIAGNOSIS — Z8249 Family history of ischemic heart disease and other diseases of the circulatory system: Secondary | ICD-10-CM | POA: Diagnosis not present

## 2020-12-23 DIAGNOSIS — M6281 Muscle weakness (generalized): Secondary | ICD-10-CM | POA: Diagnosis not present

## 2020-12-23 DIAGNOSIS — I69391 Dysphagia following cerebral infarction: Secondary | ICD-10-CM | POA: Diagnosis not present

## 2020-12-23 DIAGNOSIS — E1169 Type 2 diabetes mellitus with other specified complication: Secondary | ICD-10-CM | POA: Diagnosis not present

## 2020-12-23 DIAGNOSIS — T83511A Infection and inflammatory reaction due to indwelling urethral catheter, initial encounter: Secondary | ICD-10-CM | POA: Diagnosis not present

## 2020-12-23 DIAGNOSIS — N182 Chronic kidney disease, stage 2 (mild): Secondary | ICD-10-CM | POA: Diagnosis not present

## 2020-12-23 DIAGNOSIS — R1310 Dysphagia, unspecified: Secondary | ICD-10-CM | POA: Diagnosis not present

## 2020-12-23 DIAGNOSIS — R579 Shock, unspecified: Secondary | ICD-10-CM | POA: Diagnosis not present

## 2020-12-23 DIAGNOSIS — N3091 Cystitis, unspecified with hematuria: Secondary | ICD-10-CM | POA: Diagnosis present

## 2020-12-23 DIAGNOSIS — I498 Other specified cardiac arrhythmias: Secondary | ICD-10-CM | POA: Diagnosis not present

## 2020-12-23 DIAGNOSIS — R7881 Bacteremia: Secondary | ICD-10-CM | POA: Diagnosis not present

## 2020-12-23 DIAGNOSIS — R319 Hematuria, unspecified: Secondary | ICD-10-CM | POA: Diagnosis not present

## 2020-12-23 DIAGNOSIS — E871 Hypo-osmolality and hyponatremia: Secondary | ICD-10-CM | POA: Diagnosis not present

## 2020-12-23 DIAGNOSIS — E1122 Type 2 diabetes mellitus with diabetic chronic kidney disease: Secondary | ICD-10-CM | POA: Diagnosis not present

## 2020-12-23 DIAGNOSIS — G9341 Metabolic encephalopathy: Secondary | ICD-10-CM | POA: Diagnosis not present

## 2020-12-23 DIAGNOSIS — R519 Headache, unspecified: Secondary | ICD-10-CM | POA: Diagnosis not present

## 2020-12-23 DIAGNOSIS — E119 Type 2 diabetes mellitus without complications: Secondary | ICD-10-CM | POA: Diagnosis not present

## 2020-12-23 DIAGNOSIS — R6889 Other general symptoms and signs: Secondary | ICD-10-CM | POA: Diagnosis not present

## 2020-12-23 DIAGNOSIS — G4730 Sleep apnea, unspecified: Secondary | ICD-10-CM | POA: Diagnosis not present

## 2020-12-23 DIAGNOSIS — R1313 Dysphagia, pharyngeal phase: Secondary | ICD-10-CM | POA: Diagnosis not present

## 2020-12-23 DIAGNOSIS — M329 Systemic lupus erythematosus, unspecified: Secondary | ICD-10-CM | POA: Diagnosis not present

## 2020-12-23 DIAGNOSIS — Y846 Urinary catheterization as the cause of abnormal reaction of the patient, or of later complication, without mention of misadventure at the time of the procedure: Secondary | ICD-10-CM | POA: Diagnosis present

## 2020-12-23 DIAGNOSIS — Z743 Need for continuous supervision: Secondary | ICD-10-CM | POA: Diagnosis not present

## 2020-12-23 DIAGNOSIS — R338 Other retention of urine: Secondary | ICD-10-CM | POA: Diagnosis not present

## 2020-12-23 DIAGNOSIS — R5381 Other malaise: Secondary | ICD-10-CM | POA: Diagnosis not present

## 2020-12-23 DIAGNOSIS — N390 Urinary tract infection, site not specified: Secondary | ICD-10-CM | POA: Diagnosis not present

## 2020-12-23 DIAGNOSIS — R2689 Other abnormalities of gait and mobility: Secondary | ICD-10-CM | POA: Diagnosis not present

## 2020-12-23 DIAGNOSIS — E860 Dehydration: Secondary | ICD-10-CM | POA: Diagnosis not present

## 2020-12-23 DIAGNOSIS — N3001 Acute cystitis with hematuria: Secondary | ICD-10-CM | POA: Diagnosis not present

## 2020-12-23 DIAGNOSIS — Z20822 Contact with and (suspected) exposure to covid-19: Secondary | ICD-10-CM | POA: Diagnosis not present

## 2020-12-23 DIAGNOSIS — Y732 Prosthetic and other implants, materials and accessory gastroenterology and urology devices associated with adverse incidents: Secondary | ICD-10-CM | POA: Diagnosis present

## 2020-12-23 DIAGNOSIS — F0280 Dementia in other diseases classified elsewhere without behavioral disturbance: Secondary | ICD-10-CM | POA: Diagnosis present

## 2020-12-23 DIAGNOSIS — R1312 Dysphagia, oropharyngeal phase: Secondary | ICD-10-CM | POA: Diagnosis not present

## 2020-12-23 LAB — RESP PANEL BY RT-PCR (FLU A&B, COVID) ARPGX2
Influenza A by PCR: NEGATIVE
Influenza B by PCR: NEGATIVE
SARS Coronavirus 2 by RT PCR: NEGATIVE

## 2020-12-23 LAB — GLUCOSE, CAPILLARY
Glucose-Capillary: 246 mg/dL — ABNORMAL HIGH (ref 70–99)
Glucose-Capillary: 236 mg/dL — ABNORMAL HIGH (ref 70–99)

## 2020-12-23 MED ORDER — INSULIN GLARGINE 100 UNIT/ML ~~LOC~~ SOLN: 6.0000 [IU] | Freq: Every day | SUBCUTANEOUS | 3 refills | Status: DC

## 2020-12-23 MED ORDER — INSULIN GLARGINE-YFGN 100 UNIT/ML ~~LOC~~ SOLN
5.0000 [IU] | Freq: Every day | SUBCUTANEOUS | Status: DC
  Administered 2020-12-23: 5 [IU] via SUBCUTANEOUS
  Filled 2020-12-23: qty 0.05

## 2020-12-23 MED ORDER — ACETAMINOPHEN 500 MG PO TABS: 1000.0000 mg | ORAL_TABLET | Freq: Three times a day (TID) | ORAL | 0 refills | Status: AC | PRN

## 2020-12-23 NOTE — TOC Progression Note (Signed)
Transition of Care [facility]) - Progression Note    Patient Details  Name: Albert Garrison MRN: [id number] Date of Birth: [birth year]  Transition of Care [facility]) CM/SW Contact  Reece Agar, [place] Phone Number: 12/23/2020, 12:28 PM  Clinical Narrative:    Pt auth was approved ref# [id number] for 3 days starting 0/93 for [facility] re[facility]. CSW contacted Debbie at [facility] who shared she already had this pt information and is prepared to take pt today.   Expected Discharge Plan: [place] Barriers to Discharge: Continued Medical Work up  Expected Discharge Plan and Services Expected Discharge Plan: [place] arrangements for the past 2 months: [place] Expected Discharge Date: 12/22/20                                     Social Determinants of Health (SDOH) Interventions    Readmission Risk Interventions No flowsheet data found.

## 2020-12-23 NOTE — Plan of Care (Signed)
Patient progressing 

## 2020-12-23 NOTE — Progress Notes (Signed)
Patient discharged to SNF on 12/22/2020 but stayed overnight due to logistic issue.  Discharge summary from 12/24/2020 addended with addition of Lantus 6 units daily and as needed Tylenol

## 2020-12-23 NOTE — TOC Transition Note (Signed)
Transition of Care [facility]) - CM/SW Discharge Note   Patient Details  Name: Albert Garrison MRN: [id number] Date of Birth: [birth year]  Transition of Care [facility]) CM/SW Contact:  Tresa Endo Phone Number: 12/23/2020, 12:36 PM   Clinical Narrative:    Patient will DC to: [facility] Anticipated DC date: 12/23/2020 Family notified: Pt brother Transport by: Corey Harold   Per MD patient ready for DC to . RN to call report prior to discharge (). RN, patient, patient's family, and facility notified of DC. Discharge Summary and FL2 sent to facility. DC packet on chart. Ambulance transport requested for patient.   CSW will sign off for now as social work intervention is no longer needed. Please consult [place] again if new needs arise.     Final next level of care: Skilled Nursing Facility Barriers to Discharge: Continued Medical Work up   Patient Goals and CMS Choice     Choice offered to / list presented to : Sibling  Discharge Placement                       Discharge Plan and Services                                     Social Determinants of Health (SDOH) Interventions     Readmission Risk Interventions No flowsheet data found.

## 2020-12-24 DIAGNOSIS — I10 Essential (primary) hypertension: Secondary | ICD-10-CM | POA: Diagnosis not present

## 2020-12-24 DIAGNOSIS — K56609 Unspecified intestinal obstruction, unspecified as to partial versus complete obstruction: Secondary | ICD-10-CM | POA: Diagnosis not present

## 2020-12-24 DIAGNOSIS — E785 Hyperlipidemia, unspecified: Secondary | ICD-10-CM | POA: Diagnosis not present

## 2020-12-24 DIAGNOSIS — E1169 Type 2 diabetes mellitus with other specified complication: Secondary | ICD-10-CM | POA: Diagnosis not present

## 2020-12-24 DIAGNOSIS — Z794 Long term (current) use of insulin: Secondary | ICD-10-CM | POA: Diagnosis not present

## 2020-12-24 DIAGNOSIS — N182 Chronic kidney disease, stage 2 (mild): Secondary | ICD-10-CM | POA: Diagnosis not present

## 2020-12-24 DIAGNOSIS — E860 Dehydration: Secondary | ICD-10-CM | POA: Diagnosis not present

## 2020-12-25 ENCOUNTER — Other Ambulatory Visit: Payer: Self-pay

## 2020-12-25 ENCOUNTER — Emergency Department (HOSPITAL_COMMUNITY): Payer: Medicare Other

## 2020-12-25 ENCOUNTER — Inpatient Hospital Stay (HOSPITAL_COMMUNITY)
Admission: EM | Admit: 2020-12-25 | Discharge: 2021-01-11 | DRG: 698 | Disposition: A | Discharge status: Home Health | Payer: Medicare Other | Source: Skilled Nursing Facility | Attending: Internal Medicine | Admitting: Internal Medicine

## 2020-12-25 DIAGNOSIS — Z20822 Contact with and (suspected) exposure to covid-19: Secondary | ICD-10-CM | POA: Diagnosis not present

## 2020-12-25 DIAGNOSIS — Z7982 Long term (current) use of aspirin: Secondary | ICD-10-CM

## 2020-12-25 DIAGNOSIS — Z111 Encounter for screening for respiratory tuberculosis: Secondary | ICD-10-CM

## 2020-12-25 DIAGNOSIS — Z743 Need for continuous supervision: Secondary | ICD-10-CM | POA: Diagnosis not present

## 2020-12-25 DIAGNOSIS — R319 Hematuria, unspecified: Secondary | ICD-10-CM | POA: Diagnosis present

## 2020-12-25 DIAGNOSIS — E1122 Type 2 diabetes mellitus with diabetic chronic kidney disease: Secondary | ICD-10-CM | POA: Diagnosis not present

## 2020-12-25 DIAGNOSIS — R6889 Other general symptoms and signs: Secondary | ICD-10-CM | POA: Diagnosis not present

## 2020-12-25 DIAGNOSIS — Z981 Arthrodesis status: Secondary | ICD-10-CM | POA: Diagnosis not present

## 2020-12-25 DIAGNOSIS — K219 Gastro-esophageal reflux disease without esophagitis: Secondary | ICD-10-CM | POA: Diagnosis not present

## 2020-12-25 DIAGNOSIS — I10 Essential (primary) hypertension: Secondary | ICD-10-CM | POA: Diagnosis present

## 2020-12-25 DIAGNOSIS — E871 Hypo-osmolality and hyponatremia: Secondary | ICD-10-CM | POA: Diagnosis not present

## 2020-12-25 DIAGNOSIS — E119 Type 2 diabetes mellitus without complications: Secondary | ICD-10-CM

## 2020-12-25 DIAGNOSIS — M1990 Unspecified osteoarthritis, unspecified site: Secondary | ICD-10-CM | POA: Diagnosis present

## 2020-12-25 DIAGNOSIS — R338 Other retention of urine: Secondary | ICD-10-CM | POA: Diagnosis not present

## 2020-12-25 DIAGNOSIS — I252 Old myocardial infarction: Secondary | ICD-10-CM

## 2020-12-25 DIAGNOSIS — N390 Urinary tract infection, site not specified: Secondary | ICD-10-CM | POA: Diagnosis not present

## 2020-12-25 DIAGNOSIS — R1311 Dysphagia, oral phase: Secondary | ICD-10-CM | POA: Diagnosis not present

## 2020-12-25 DIAGNOSIS — R1313 Dysphagia, pharyngeal phase: Secondary | ICD-10-CM | POA: Diagnosis present

## 2020-12-25 DIAGNOSIS — I69391 Dysphagia following cerebral infarction: Secondary | ICD-10-CM | POA: Diagnosis not present

## 2020-12-25 DIAGNOSIS — N401 Enlarged prostate with lower urinary tract symptoms: Secondary | ICD-10-CM | POA: Diagnosis present

## 2020-12-25 DIAGNOSIS — G9341 Metabolic encephalopathy: Secondary | ICD-10-CM | POA: Diagnosis not present

## 2020-12-25 DIAGNOSIS — M329 Systemic lupus erythematosus, unspecified: Secondary | ICD-10-CM | POA: Diagnosis present

## 2020-12-25 DIAGNOSIS — Z8249 Family history of ischemic heart disease and other diseases of the circulatory system: Secondary | ICD-10-CM | POA: Diagnosis not present

## 2020-12-25 DIAGNOSIS — Z8673 Personal history of transient ischemic attack (TIA), and cerebral infarction without residual deficits: Secondary | ICD-10-CM

## 2020-12-25 DIAGNOSIS — N183 Chronic kidney disease, stage 3 unspecified: Secondary | ICD-10-CM | POA: Diagnosis present

## 2020-12-25 DIAGNOSIS — E876 Hypokalemia: Secondary | ICD-10-CM | POA: Diagnosis not present

## 2020-12-25 DIAGNOSIS — G4730 Sleep apnea, unspecified: Secondary | ICD-10-CM | POA: Diagnosis present

## 2020-12-25 DIAGNOSIS — Z794 Long term (current) use of insulin: Secondary | ICD-10-CM

## 2020-12-25 DIAGNOSIS — I129 Hypertensive chronic kidney disease with stage 1 through stage 4 chronic kidney disease, or unspecified chronic kidney disease: Secondary | ICD-10-CM | POA: Diagnosis not present

## 2020-12-25 DIAGNOSIS — Z87891 Personal history of nicotine dependence: Secondary | ICD-10-CM | POA: Diagnosis not present

## 2020-12-25 DIAGNOSIS — Z7984 Long term (current) use of oral hypoglycemic drugs: Secondary | ICD-10-CM

## 2020-12-25 DIAGNOSIS — N3091 Cystitis, unspecified with hematuria: Secondary | ICD-10-CM

## 2020-12-25 DIAGNOSIS — F0280 Dementia in other diseases classified elsewhere without behavioral disturbance: Secondary | ICD-10-CM | POA: Diagnosis present

## 2020-12-25 DIAGNOSIS — Y732 Prosthetic and other implants, materials and accessory gastroenterology and urology devices associated with adverse incidents: Secondary | ICD-10-CM | POA: Diagnosis present

## 2020-12-25 DIAGNOSIS — N138 Other obstructive and reflux uropathy: Secondary | ICD-10-CM | POA: Diagnosis present

## 2020-12-25 DIAGNOSIS — Z8674 Personal history of sudden cardiac arrest: Secondary | ICD-10-CM

## 2020-12-25 DIAGNOSIS — Y846 Urinary catheterization as the cause of abnormal reaction of the patient, or of later complication, without mention of misadventure at the time of the procedure: Secondary | ICD-10-CM | POA: Diagnosis present

## 2020-12-25 DIAGNOSIS — N3001 Acute cystitis with hematuria: Secondary | ICD-10-CM | POA: Diagnosis not present

## 2020-12-25 DIAGNOSIS — T83511A Infection and inflammatory reaction due to indwelling urethral catheter, initial encounter: Secondary | ICD-10-CM | POA: Diagnosis not present

## 2020-12-25 DIAGNOSIS — R519 Headache, unspecified: Secondary | ICD-10-CM | POA: Diagnosis not present

## 2020-12-25 DIAGNOSIS — R7881 Bacteremia: Secondary | ICD-10-CM | POA: Diagnosis present

## 2020-12-25 DIAGNOSIS — Z79899 Other long term (current) drug therapy: Secondary | ICD-10-CM

## 2020-12-25 DIAGNOSIS — H9190 Unspecified hearing loss, unspecified ear: Secondary | ICD-10-CM | POA: Diagnosis present

## 2020-12-25 DIAGNOSIS — R509 Fever, unspecified: Secondary | ICD-10-CM | POA: Diagnosis not present

## 2020-12-25 LAB — URINALYSIS, ROUTINE W REFLEX MICROSCOPIC
Bacteria, UA: NONE SEEN
Bilirubin Urine: NEGATIVE
Glucose, UA: >=500 mg/dL — AB
Ketones, ur: NEGATIVE mg/dL
Nitrite: NEGATIVE
Protein, ur: 100 mg/dL — AB
RBC / HPF: >50 RBC/hpf — ABNORMAL HIGH (ref 0–5)
Specific Gravity, Urine: 1.015 (ref 1.005–1.030)
WBC, UA: >50 WBC/hpf — ABNORMAL HIGH (ref 0–5)
pH: 5 (ref 5.0–8.0)

## 2020-12-25 LAB — CBC WITH DIFFERENTIAL/PLATELET
Abs Immature Granulocytes: 0.11 10*3/uL — ABNORMAL HIGH (ref 0.00–0.07)
Basophils Absolute: 0 10*3/uL (ref 0.0–0.1)
Basophils Relative: 0 %
Eosinophils Absolute: 0.5 10*3/uL (ref 0.0–0.5)
Eosinophils Relative: 3 %
HCT: 37.3 % — ABNORMAL LOW (ref 39.0–52.0)
Hemoglobin: 12.3 g/dL — ABNORMAL LOW (ref 13.0–17.0)
Immature Granulocytes: 1 %
Lymphocytes Relative: 7 %
Lymphs Abs: 1.4 10*3/uL (ref 0.7–4.0)
MCH: 30 pg (ref 26.0–34.0)
MCHC: 33 g/dL (ref 30.0–36.0)
MCV: 91 fL (ref 80.0–100.0)
Monocytes Absolute: 1.4 10*3/uL — ABNORMAL HIGH (ref 0.1–1.0)
Monocytes Relative: 7 %
Neutro Abs: 17.2 10*3/uL — ABNORMAL HIGH (ref 1.7–7.7)
Neutrophils Relative %: 82 %
Platelets: 302 10*3/uL (ref 150–400)
RBC: 4.1 MIL/uL — ABNORMAL LOW (ref 4.22–5.81)
RDW: 13.4 % (ref 11.5–15.5)
WBC: 20.6 10*3/uL — ABNORMAL HIGH (ref 4.0–10.5)
nRBC: 0 % (ref 0.0–0.2)

## 2020-12-25 LAB — GLUCOSE, CAPILLARY: Glucose-Capillary: 183 mg/dL — ABNORMAL HIGH (ref 70–99)

## 2020-12-25 LAB — COMPREHENSIVE METABOLIC PANEL
ALT: 18 U/L (ref 0–44)
AST: 22 U/L (ref 15–41)
Albumin: 3 g/dL — ABNORMAL LOW (ref 3.5–5.0)
Alkaline Phosphatase: 89 U/L (ref 38–126)
Anion gap: 10 (ref 5–15)
BUN: 15 mg/dL (ref 8–23)
CO2: 20 mmol/L — ABNORMAL LOW (ref 22–32)
Calcium: 8.1 mg/dL — ABNORMAL LOW (ref 8.9–10.3)
Chloride: 102 mmol/L (ref 98–111)
Creatinine, Ser: 1.13 mg/dL (ref 0.61–1.24)
GFR, Estimated: >60 mL/min (ref >60)
Glucose, Bld: 229 mg/dL — ABNORMAL HIGH (ref 70–99)
Potassium: 3.8 mmol/L (ref 3.5–5.1)
Sodium: 132 mmol/L — ABNORMAL LOW (ref 135–145)
Total Bilirubin: 0.7 mg/dL (ref 0.3–1.2)
Total Protein: 6.8 g/dL (ref 6.5–8.1)

## 2020-12-25 LAB — RESP PANEL BY RT-PCR (FLU A&B, COVID) ARPGX2
Influenza A by PCR: NEGATIVE
Influenza B by PCR: NEGATIVE
SARS Coronavirus 2 by RT PCR: NEGATIVE

## 2020-12-25 LAB — LACTIC ACID, PLASMA: Lactic Acid, Venous: 1.6 mmol/L (ref 0.5–1.9)

## 2020-12-25 MED ORDER — SODIUM CHLORIDE 0.9 % IV SOLN
1.0000 g | Freq: Once | INTRAVENOUS | Status: AC
  Administered 2020-12-25: 1 g via INTRAVENOUS
  Filled 2020-12-25: qty 10

## 2020-12-25 MED ORDER — ACETAMINOPHEN 500 MG PO TABS: 1000.0000 mg | ORAL_TABLET | Freq: Once | ORAL | Status: DC

## 2020-12-25 MED ORDER — ENOXAPARIN SODIUM 40 MG/0.4ML IJ SOSY
40.0000 mg | PREFILLED_SYRINGE | Freq: Every day | INTRAMUSCULAR | Status: DC
  Administered 2020-12-25: 40 mg via SUBCUTANEOUS
  Filled 2020-12-25: qty 0.4

## 2020-12-25 MED ORDER — AMLODIPINE BESYLATE 5 MG PO TABS
10.0000 mg | ORAL_TABLET | Freq: Every day | ORAL | Status: DC
  Administered 2020-12-27 – 2021-01-11 (×16): 10 mg via ORAL
  Filled 2020-12-25 (×18): qty 2

## 2020-12-25 MED ORDER — SODIUM CHLORIDE 0.9 % IV SOLN: INTRAVENOUS | Status: DC

## 2020-12-25 MED ORDER — ACETAMINOPHEN 325 MG PO TABS
650.0000 mg | ORAL_TABLET | Freq: Four times a day (QID) | ORAL | Status: DC | PRN
  Administered 2020-12-25 – 2021-01-05 (×6): 650 mg via ORAL
  Filled 2020-12-25 (×6): qty 2

## 2020-12-25 MED ORDER — ATORVASTATIN CALCIUM 40 MG PO TABS
40.0000 mg | ORAL_TABLET | Freq: Every day | ORAL | Status: DC
  Administered 2020-12-26 – 2021-01-10 (×16): 40 mg via ORAL
  Filled 2020-12-25 (×16): qty 1

## 2020-12-25 MED ORDER — INSULIN ASPART 100 UNIT/ML IJ SOLN
0.0000 [IU] | Freq: Three times a day (TID) | INTRAMUSCULAR | Status: DC
  Administered 2020-12-26: 5 [IU] via SUBCUTANEOUS
  Administered 2020-12-26: 3 [IU] via SUBCUTANEOUS
  Administered 2020-12-27: 2 [IU] via SUBCUTANEOUS
  Administered 2020-12-27: 5 [IU] via SUBCUTANEOUS
  Administered 2020-12-27 – 2020-12-29 (×5): 3 [IU] via SUBCUTANEOUS
  Administered 2020-12-29: 2 [IU] via SUBCUTANEOUS
  Administered 2020-12-29 – 2020-12-30 (×2): 3 [IU] via SUBCUTANEOUS
  Administered 2020-12-30: 2 [IU] via SUBCUTANEOUS
  Administered 2020-12-30 – 2020-12-31 (×2): 3 [IU] via SUBCUTANEOUS
  Administered 2020-12-31: 2 [IU] via SUBCUTANEOUS
  Administered 2020-12-31 – 2021-01-01 (×2): 5 [IU] via SUBCUTANEOUS
  Administered 2021-01-01 – 2021-01-02 (×3): 3 [IU] via SUBCUTANEOUS
  Administered 2021-01-02: 5 [IU] via SUBCUTANEOUS
  Administered 2021-01-02: 3 [IU] via SUBCUTANEOUS
  Administered 2021-01-03: 1 [IU] via SUBCUTANEOUS
  Administered 2021-01-03 – 2021-01-04 (×4): 3 [IU] via SUBCUTANEOUS
  Administered 2021-01-05: 2 [IU] via SUBCUTANEOUS
  Administered 2021-01-05 (×2): 3 [IU] via SUBCUTANEOUS
  Administered 2021-01-06: 5 [IU] via SUBCUTANEOUS
  Administered 2021-01-06 – 2021-01-07 (×3): 3 [IU] via SUBCUTANEOUS
  Administered 2021-01-07 – 2021-01-08 (×2): 2 [IU] via SUBCUTANEOUS
  Administered 2021-01-08 – 2021-01-09 (×3): 3 [IU] via SUBCUTANEOUS
  Administered 2021-01-10: 2 [IU] via SUBCUTANEOUS
  Administered 2021-01-10 – 2021-01-11 (×4): 3 [IU] via SUBCUTANEOUS

## 2020-12-25 MED ORDER — ASPIRIN 81 MG PO CHEW
81.0000 mg | CHEWABLE_TABLET | Freq: Every day | ORAL | Status: DC
  Administered 2020-12-26 – 2021-01-11 (×17): 81 mg via ORAL
  Filled 2020-12-25 (×17): qty 1

## 2020-12-25 MED ORDER — CEFTRIAXONE SODIUM 2 G IJ SOLR
2.0000 g | INTRAMUSCULAR | Status: AC
  Administered 2020-12-26 – 2021-01-03 (×9): 2 g via INTRAVENOUS
  Filled 2020-12-25 (×10): qty 20

## 2020-12-25 MED ORDER — ONDANSETRON HCL 4 MG/2ML IJ SOLN: 4.0000 mg | Freq: Four times a day (QID) | INTRAMUSCULAR | Status: DC | PRN

## 2020-12-25 MED ORDER — ONDANSETRON HCL 4 MG PO TABS: 4.0000 mg | ORAL_TABLET | Freq: Four times a day (QID) | ORAL | Status: DC | PRN

## 2020-12-25 MED ORDER — ACETAMINOPHEN 650 MG RE SUPP
650.0000 mg | Freq: Once | RECTAL | Status: AC
  Administered 2020-12-25: 650 mg via RECTAL
  Filled 2020-12-25: qty 1

## 2020-12-25 MED ORDER — TAMSULOSIN HCL 0.4 MG PO CAPS
0.4000 mg | ORAL_CAPSULE | Freq: Every day | ORAL | Status: DC
  Administered 2020-12-26 – 2021-01-10 (×16): 0.4 mg via ORAL
  Filled 2020-12-25 (×17): qty 1

## 2020-12-25 MED ORDER — METOPROLOL TARTRATE 25 MG PO TABS
25.0000 mg | ORAL_TABLET | Freq: Two times a day (BID) | ORAL | Status: DC
  Administered 2020-12-25 – 2021-01-10 (×31): 25 mg via ORAL
  Filled 2020-12-25 (×33): qty 1

## 2020-12-25 MED ORDER — CEPHALEXIN 500 MG PO CAPS: 500.0000 mg | ORAL_CAPSULE | Freq: Four times a day (QID) | ORAL | 0 refills | Status: DC

## 2020-12-25 NOTE — ED Provider Notes (Signed)
[facility] EMERGENCY DEPARTMENT Provider Note   CSN: [id number] Arrival date & time: 12/25/20  1103     History Chief Complaint  Patient presents with   Hematuria    ORIEL RUMBOLD is a 75 y.o. male.  Patient from [place] home.  Patient recently admitted from August 26 through September 20.  During the tail end of that hospitalization he had Foley catheter discharge.  But he failed voiding properly.  So it was replaced.  Sent in today for blood noted in the Foley catheter bag.  Patient without any other significant complaints.  Past medical history is significant for history of complicated urinary tract infections history of stroke hard of hearing hypertension type 2 diabetes.      Past Medical History:  Diagnosis Date   Arthritis    BPH (benign prostatic hyperplasia)    CKD (chronic kidney disease), stage II    Complicated UTI (urinary tract infection) 03/2014   Depression    GERD (gastroesophageal reflux disease)    History of pneumonia    History of stroke    HOH (hard of hearing)    Hypertension    Hyponatremia    Lower GI bleed 2017   a. ? due to polyp.   NSVT (nonsustained ventricular tachycardia) (HCC)    Rheumatic fever    Childhood   Sleep apnea    Does not use CPAP   Type 2 diabetes mellitus [facility])     Patient Active Problem List   Diagnosis Date Noted   Dysphagia 12/22/2020   Acute hypoxemic respiratory failure ([place]) 11/30/2020   Shock circulatory ([place]) 11/30/2020   Cardiac arrest ([place]) 11/30/2020   Other specified cardiac arrhythmias--- sleep apnea- arrhythmias when he desaturates  11/30/2020   Endotracheally intubated 11/30/2020   SBO (small bowel obstruction) ([place]) 11/28/2020   Acute CVA (cerebrovascular accident) ([place]) 08/24/2017   Altered mental status 08/23/2017   Sleep apnea 08/23/2017   Elevated troponin 08/23/2017   Abnormal CT of the head 08/23/2017   Hypertension 08/23/2017   Type 2 diabetes mellitus ([place]) 08/23/2017   Syncope  08/23/2017   S/P carpal tunnel release right 07/13/17 07/27/2017   AKI (acute kidney injury) ([place]) 11/29/2015   Lower GI bleed 11/28/2015   Rectal bleed 11/28/2015   Rectal bleeding 11/28/2015   Benign prostatic hyperplasia with urinary retention 01/20/2015   Urinary retention 05/21/2014   ARF (acute renal failure) ([place]) 03/26/2014   IDDM (insulin dependent diabetes mellitus) ([place]) 03/26/2014   UTI (lower urinary tract infection) 03/24/2014   Hyponatremia 03/24/2014   Acute encephalopathy 03/24/2014    Past Surgical History:  Procedure Laterality Date   CARPAL TUNNEL RELEASE Left 2010   CARPAL TUNNEL RELEASE Right 07/13/2017   Procedure: CARPAL TUNNEL RELEASE;  Surgeon: Carole Civil, MD;  Location: [facility] ORS;  Service: Orthopedics;  Laterality: Right;   CERVICAL SPINE SURGERY  2010   COLONOSCOPY WITH PROPOFOL N/A 11/30/2015   Procedure: COLONOSCOPY WITH PROPOFOL;  Surgeon: Daneil Dolin, MD;  Location: [facility] ENDO SUITE;  Service: Endoscopy;  Laterality: N/A;   KNEE ARTHROSCOPY  [place] surgery - broken nose     POLYPECTOMY  11/30/2015   Procedure: POLYPECTOMY;  Surgeon: Daneil Dolin, MD;  Location: [facility] ENDO SUITE;  Service: Endoscopy;;  polyp at ascending colon, rectal polyp   TRANSURETHRAL INCISION OF PROSTATE N/A 01/20/2015   Procedure: TRANSURETHRAL INCISION OF THE PROSTATE (TUIP);  Surgeon: Irine Seal, MD;  Location:  [facility] ORS;  Service: Urology;  Laterality: N/A;       Family History  Problem Relation Age of Onset   Heart disease Sister    Hypertension Sister     Social History   Tobacco Use   Smoking status: Former    Packs/day: 1.50    Years: 14.00    Pack years: 21.00    Types: Cigarettes    Quit date: 08/06/1975    Years since quitting: 45.4   Smokeless tobacco: Never  Vaping Use   Vaping Use: Never used  Substance Use Topics   Alcohol use: Yes    Alcohol/week: 3.0 standard drinks    Types: 3 Cans of beer per week    Comment:  3 12 oz cans of beer each week.None since 03/2014   Drug use: No    Home Medications Prior to Admission medications   Medication Sig Start Date End Date Taking? Authorizing Provider  acetaminophen (TYLENOL) 500 MG tablet Take 2 tablets (1,000 mg total) by mouth every 8 (eight) hours as needed for up to 10 days. 12/23/20 01/02/21 Yes Mercy Riding, MD  amLODipine (NORVASC) 10 MG tablet Take 1 tablet (10 mg total) by mouth daily. 08/25/17  Yes Kathie Dike, MD  aspirin 81 MG chewable tablet Chew 1 tablet (81 mg total) by mouth daily. 12/23/20  Yes Debbe Odea, MD  atorvastatin (LIPITOR) 40 MG tablet Take 40 mg by mouth daily.   Yes [provider]  insulin aspart (NOVOLOG) 100 UNIT/ML injection Inject 0-15 Units into the skin 3 (three) times daily with meals. CBG < 70: Implement Hypoglycemia Standing Orders and refer to Hypoglycemia Standing Orders sidebar report  CBG 70 - 120: 0 units  CBG 121 - 150: 2 units  CBG 151 - 200: 3 units  CBG 201 - 250: 5 units  CBG 251 - 300: 8 units  CBG 301 - 350: 11 units  CBG 351 - 400: 15 units 12/22/20  Yes Rizwan, Eunice Blase, MD  metFORMIN (GLUCOPHAGE) 500 MG tablet Take 500 mg by mouth daily with breakfast.   Yes [provider]  metoprolol tartrate (LOPRESSOR) 25 MG tablet Take 1 tablet (25 mg total) by mouth 2 (two) times daily. 12/22/20  Yes Debbe Odea, MD  Multiple Vitamin (MULTIVITAMIN WITH MINERALS) TABS tablet Take 1 tablet by mouth daily. 12/23/20  Yes Debbe Odea, MD  tamsulosin (FLOMAX) 0.4 MG CAPS capsule Take 1 capsule (0.4 mg total) by mouth daily after supper. 12/22/20  Yes Debbe Odea, MD  insulin glargine (LANTUS) 100 UNIT/ML injection Inject 0.06 mLs (6 Units total) into the skin daily. Patient not taking: Reported on 12/25/2020 12/23/20   Mercy Riding, MD    Allergies    Patient has no known allergies.  Review of Systems   Review of Systems  Unable to perform ROS: Dementia  Genitourinary:  Positive for hematuria.    Physical Exam Updated Vital Signs BP (!) 144/73 (BP Location: Left Arm)   Pulse 83   Temp 98 F (36.7 C) (Oral)   Resp 17   SpO2 97%   Physical Exam Vitals and nursing note reviewed.  Constitutional:      General: He is not in acute distress.    Appearance: Normal appearance. He is well-developed.  HENT:     Head: Normocephalic and atraumatic.  Eyes:     Extraocular Movements: Extraocular movements intact.     Conjunctiva/sclera: Conjunctivae normal.     Pupils: Pupils are equal, round, and reactive  to light.  Cardiovascular:     Rate and Rhythm: Normal rate and regular rhythm.     Heart sounds: No murmur heard. Pulmonary:     Effort: Pulmonary effort is normal. No respiratory distress.     Breath sounds: Normal breath sounds.  Abdominal:     General: There is no distension.     Palpations: Abdomen is soft.     Tenderness: There is no abdominal tenderness.  Genitourinary:    Penis: Normal.      Comments: Foley catheter in place.  No blood around the meatus.  There is blood in the Foley bag. Musculoskeletal:        General: No swelling.     Cervical back: Normal range of motion and neck supple.  Skin:    General: Skin is warm and dry.  Neurological:     Mental Status: He is alert. Mental status is at baseline.     Cranial Nerves: No cranial nerve deficit.     Sensory: No sensory deficit.     Motor: No weakness.    ED Results / Procedures / Treatments   Labs (all labs ordered are listed, but only abnormal results are displayed) Labs Reviewed - No data to display  EKG None  Radiology No results found.  Procedures Procedures   Medications Ordered in ED Medications - No data to display  ED Course  I have reviewed the triage vital signs and the nursing notes.  Pertinent labs & imaging results that were available during my care of the patient were reviewed by me and considered in my medical decision making (see chart for details).    MDM  Rules/Calculators/A&P                         CRITICAL CARE Performed by: Fredia Sorrow Total critical care time: 45 minutes Critical care time was exclusive of separately billable procedures and treating other patients. Critical care was necessary to treat or prevent imminent or life-threatening deterioration. Critical care was time spent personally by me on the following activities: development of treatment plan with patient and/or surrogate as well as nursing, discussions with consultants, evaluation of patient's response to treatment, examination of patient, obtaining history from patient or surrogate, ordering and performing treatments and interventions, ordering and review of laboratory studies, ordering and review of radiographic studies, pulse oximetry and re-evaluation of patient's condition.  Patient sent in from [place].  Initial concern was just hematuria.  Urinalysis had greater than 50 white blood cells and greater 50 red blood cells.  But had no significant bacteria.  Urine was hazy.  Did have some gross hematuria but no clots.  Initially was planning just to do urine culture treat him with Rocephin and have him continue Keflex at the nursing facility.  However at discharge nurse informed me that he has a temp of 103.  This raises concerns for possible early sepsis.  May be even COVID.  Patient therefore will get blood cultures lactic acid will receive some IV fluids we will get a chest x-ray EKG CBC with differential and a complete metabolic panel.  And will need to be reassessed.  Also ordered Tylenol.  Patient turned over to evening provider for disposition.     Final Clinical Impression(s) / ED Diagnoses Final diagnoses:  None    Rx / DC Orders ED Discharge Orders     None        Fredia Sorrow, MD 12/25/20 1544

## 2020-12-25 NOTE — Discharge Instructions (Addendum)
Hematuria may be secondary to infection.  Urine sent for culture.  Patient given a gram of Rocephin here.  Would recommend continuing with Keflex for the next 7 days.  Follow-up on the urine culture.  Return for any new or worse symptoms.  Would keep Foley catheter in place until he follows up with urology.

## 2020-12-25 NOTE — H&P (Signed)
History and Physical  Albert Garrison WGN:[id number] DOB: 01/22/1946 DOA: 12/25/2020  Referring physician: Dr Dewayne Hatch, ED physician PCP: Caprice Renshaw, MD  Outpatient Specialists:   Patient Coming From: [facility]  Chief Complaint: Hematuria  HPI: Albert Garrison is a 75 y.o. male with a history of type 2 diabetes, stage III chronic kidney disease, BPH, history of stroke, GERD, history of Cardiac Arrest.  The patient was recently admitted on 11/27/2020 and was discharged approximately 3 days ago from the hospital.  During that hospitalization, the patient went into cardiac arrest and had acute hypoxemic respiratory failure, he was intubated and ROSC was obtained after about 1 minute.  The patient now presents with hematuria.  While he was in the emergency department, he spiked a fever of 103.  Laboratory data showed white count of 20,000 and a UA suggestive of UTI.  The patient is not a reliable historian as there is thought to be a component of dementia.  He does not complain of chest pain, shortness of breath.  He does admit to some dysuria.  Emergency Department Course:   Review of Systems:   Pt denies chills, nausea, vomiting, diarrhea, constipation, abdominal pain, shortness of breath, dyspnea on exertion, orthopnea, cough, wheezing, palpitations, headache, vision changes, lightheadedness, dizziness, melena, rectal bleeding.  Review of systems are otherwise negative  Past Medical History:  Diagnosis Date   Arthritis    BPH (benign prostatic hyperplasia)    CKD (chronic kidney disease), stage II    Complicated UTI (urinary tract infection) 03/2014   Depression    GERD (gastroesophageal reflux disease)    History of pneumonia    History of stroke    HOH (hard of hearing)    Hypertension    Hyponatremia    Lower GI bleed 2017   a. ? due to polyp.   NSVT (nonsustained ventricular tachycardia) (HCC)    Rheumatic fever    Childhood   Sleep apnea    Does not use CPAP    Type 2 diabetes mellitus ([place])    Past Surgical History:  Procedure Laterality Date   CARPAL TUNNEL RELEASE Left 2010   CARPAL TUNNEL RELEASE Right 07/13/2017   Procedure: CARPAL TUNNEL RELEASE;  Surgeon: Carole Civil, MD;  Location: [facility];  Service: Orthopedics;  Laterality: Right;   CERVICAL SPINE SURGERY  2010   COLONOSCOPY WITH PROPOFOL N/A 11/30/2015   Procedure: COLONOSCOPY WITH PROPOFOL;  Surgeon: Daneil Dolin, MD;  Location: [facility];  Service: Endoscopy;  Laterality: N/A;   KNEE ARTHROSCOPY  [place] surgery - broken nose     POLYPECTOMY  11/30/2015   Procedure: POLYPECTOMY;  Surgeon: Daneil Dolin, MD;  Location: [facility];  Service: Endoscopy;;  polyp at ascending colon, rectal polyp   TRANSURETHRAL INCISION OF PROSTATE N/A 01/20/2015   Procedure: TRANSURETHRAL INCISION OF THE PROSTATE (TUIP);  Surgeon: Irine Seal, MD;  Location: [facility];  Service: Urology;  Laterality: N/A;   Social History:  reports that he quit smoking about 45 years ago. His smoking use included cigarettes. He has a 21.00 pack-year smoking history. He has never used smokeless tobacco. He reports current alcohol use of about 3.0 standard drinks per week. He reports that he does not use drugs. Patient is currently living at [facility] home  No Known Allergies  Family History  Problem Relation Age of Onset   Heart disease Sister    Hypertension Sister  Prior to Admission medications   Medication Sig Start Date End Date Taking? Authorizing Provider  acetaminophen (TYLENOL) 500 MG tablet Take 2 tablets (1,000 mg total) by mouth every 8 (eight) hours as needed for up to 10 days. 12/23/20 01/02/21 Yes Mercy Riding, MD  amLODipine (NORVASC) 10 MG tablet Take 1 tablet (10 mg total) by mouth daily. 08/25/17  Yes Kathie Dike, MD  aspirin 81 MG chewable tablet Chew 1 tablet (81 mg total) by mouth daily. 12/23/20  Yes Debbe Odea, MD  atorvastatin (LIPITOR) 40 MG  tablet Take 40 mg by mouth daily.   Yes [provider]  cephALEXin (KEFLEX) 500 MG capsule Take 1 capsule (500 mg total) by mouth 4 (four) times daily. 12/25/20  Yes Fredia Sorrow, MD  insulin aspart (NOVOLOG) 100 UNIT/ML injection Inject 0-15 Units into the skin 3 (three) times daily with meals. CBG < 70: Implement Hypoglycemia Standing Orders and refer to Hypoglycemia Standing Orders sidebar report  CBG 70 - 120: 0 units  CBG 121 - 150: 2 units  CBG 151 - 200: 3 units  CBG 201 - 250: 5 units  CBG 251 - 300: 8 units  CBG 301 - 350: 11 units  CBG 351 - 400: 15 units 12/22/20  Yes Rizwan, Eunice Blase, MD  metFORMIN (GLUCOPHAGE) 500 MG tablet Take 500 mg by mouth daily with breakfast.   Yes [provider]  metoprolol tartrate (LOPRESSOR) 25 MG tablet Take 1 tablet (25 mg total) by mouth 2 (two) times daily. 12/22/20  Yes Debbe Odea, MD  Multiple Vitamin (MULTIVITAMIN WITH MINERALS) TABS tablet Take 1 tablet by mouth daily. 12/23/20  Yes Debbe Odea, MD  tamsulosin (FLOMAX) 0.4 MG CAPS capsule Take 1 capsule (0.4 mg total) by mouth daily after supper. 12/22/20  Yes Debbe Odea, MD  insulin glargine (LANTUS) 100 UNIT/ML injection Inject 0.06 mLs (6 Units total) into the skin daily. Patient not taking: Reported on 12/25/2020 12/23/20   Mercy Riding, MD    Physical Exam: BP 128/65 (BP Location: Left Arm)   Pulse 86   Temp 100 F (37.8 C) (Axillary)   Resp 20   SpO2 96%   General: Elderly male. Awake and alert and oriented x3. No acute cardiopulmonary distress.  HEENT: Normocephalic atraumatic.  Right and left ears normal in appearance.  Pupils equal, round, reactive to light. Extraocular muscles are intact. Sclerae anicteric and noninjected.  Moist mucosal membranes. No mucosal lesions.  Neck: Neck supple without lymphadenopathy. No carotid bruits. No masses palpated.  Cardiovascular: Regular rate with normal S1-S2 sounds. No murmurs, rubs, gallops auscultated. No JVD.   Respiratory: Good respiratory effort with no wheezes, rales, rhonchi. Lungs clear to auscultation bilaterally.  No accessory muscle use. Abdomen: Soft, suprapubic tenderness without rebound or guarding, nondistended. Active bowel sounds. No masses or hepatosplenomegaly  Skin: No rashes, lesions, or ulcerations.  Dry, warm to touch. 2+ dorsalis pedis and radial pulses. Musculoskeletal: No calf or leg pain. All major joints not erythematous nontender.  No upper or lower joint deformation.  Good ROM.  No contractures  Psychiatric: Not clear whether the patient has full insight into the medical problems currently ongoing.  This could be due to dementia versus metabolic encephalopathy secondary to UTI. Neurologic: No focal neurological deficits. Strength is 5/5 and symmetric in upper and lower extremities.  Cranial nerves II through XII are grossly intact.           Labs on Admission: I have personally reviewed following labs and  imaging studies  CBC: Recent Labs  Lab 12/19/20 0302 12/25/20 1624  WBC 7.8 20.6*  NEUTROABS  --  17.2*  HGB 11.8* 12.3*  HCT 35.4* 37.3*  MCV 89.8 91.0  PLT 428* 665   Basic Metabolic Panel: Recent Labs  Lab 12/19/20 0302 12/25/20 1624  NA 136 132*  K 4.1 3.8  CL 102 102  CO2 23 20*  GLUCOSE 192* 229*  BUN 28* 15  CREATININE 1.27* 1.13  CALCIUM 8.5* 8.1*   GFR: Estimated Creatinine Clearance: 59.2 mL/min (by C-G formula based on SCr of 1.13 mg/dL). Liver Function Tests: Recent Labs  Lab 12/25/20 1624  AST 22  ALT 18  ALKPHOS 89  BILITOT 0.7  PROT 6.8  ALBUMIN 3.0*   No results for input(s): LIPASE, AMYLASE in the last 168 hours. No results for input(s): AMMONIA in the last 168 hours. Coagulation Profile: No results for input(s): INR, PROTIME in the last 168 hours. Cardiac Enzymes: No results for input(s): CKTOTAL, CKMB, CKMBINDEX, TROPONINI in the last 168 hours. BNP (last 3 results) No results for input(s): PROBNP in the last 8760  hours. HbA1C: No results for input(s): HGBA1C in the last 72 hours. CBG: Recent Labs  Lab 12/22/20 1100 12/22/20 1544 12/22/20 2027 12/23/20 0607 12/23/20 1107  GLUCAP 208* 234* 233* 246* 236*   Lipid Profile: No results for input(s): CHOL, HDL, LDLCALC, TRIG, CHOLHDL, LDLDIRECT in the last 72 hours. Thyroid Function Tests: No results for input(s): TSH, T4TOTAL, FREET4, T3FREE, THYROIDAB in the last 72 hours. Anemia Panel: No results for input(s): VITAMINB12, FOLATE, FERRITIN, TIBC, IRON, RETICCTPCT in the last 72 hours. Urine analysis:    Component Value Date/Time   COLORURINE BROWN (A) 12/25/2020 1238   APPEARANCEUR HAZY (A) 12/25/2020 1238   LABSPEC 1.015 12/25/2020 1238   PHURINE 5.0 12/25/2020 1238   GLUCOSEU >=500 (A) 12/25/2020 1238   HGBUR LARGE (A) 12/25/2020 1238   BILIRUBINUR NEGATIVE 12/25/2020 1238   KETONESUR NEGATIVE 12/25/2020 1238   PROTEINUR 100 (A) 12/25/2020 1238   UROBILINOGEN 0.2 03/24/2014 1320   NITRITE NEGATIVE 12/25/2020 1238   LEUKOCYTESUR MODERATE (A) 12/25/2020 1238   Sepsis Labs: @LABRCNTIP (procalcitonin:4,lacticidven:4) ) Recent Results (from the past 240 hour(s))  Resp Panel by RT-PCR (Flu A&B, Covid) Nasopharyngeal Swab     Status: None   Collection Time: 12/23/20 11:37 AM   Specimen: Nasopharyngeal Swab; Nasopharyngeal(NP) swabs in vial transport medium  Result Value Ref Range Status   SARS Coronavirus 2 by RT PCR NEGATIVE NEGATIVE Final    Comment: (NOTE) SARS-CoV-2 target nucleic acids are NOT DETECTED.  The SARS-CoV-2 RNA is generally detectable in upper respiratory specimens during the acute phase of infection. The lowest concentration of SARS-CoV-2 viral copies this assay can detect is 138 copies/mL. A negative result does not preclude SARS-Cov-2 infection and should not be used as the sole basis for treatment or other patient management decisions. A negative result may occur with  improper specimen collection/handling,  submission of specimen other than nasopharyngeal swab, presence of viral mutation(s) within the areas targeted by this assay, and inadequate number of viral copies(<138 copies/mL). A negative result must be combined with clinical observations, patient history, and epidemiological information. The expected result is Negative.  Fact Sheet for Patients:  EntrepreneurPulse.com.au  Fact Sheet for Healthcare Providers:  IncredibleEmployment.be  This test is no t yet approved or cleared by the [place] FDA and  has been authorized for detection and/or diagnosis of SARS-CoV-2 by FDA under an Emergency Use Authorization (EUA).  This EUA will remain  in effect (meaning this test can be used) for the duration of the COVID-19 declaration under Section 564(b)(1) of the Act, 21 U.S.C.section 360bbb-3(b)(1), unless the authorization is terminated  or revoked sooner.       Influenza A by PCR NEGATIVE NEGATIVE Final   Influenza B by PCR NEGATIVE NEGATIVE Final    Comment: (NOTE) The Xpert Xpress SARS-CoV-2/FLU/RSV plus assay is intended as an aid in the diagnosis of influenza from Nasopharyngeal swab specimens and should not be used as a sole basis for treatment. Nasal washings and aspirates are unacceptable for Xpert Xpress SARS-CoV-2/FLU/RSV testing.  Fact Sheet for Patients: EntrepreneurPulse.com.au  Fact Sheet for Healthcare Providers: IncredibleEmployment.be  This test is not yet approved or cleared by the [place] FDA and has been authorized for detection and/or diagnosis of SARS-CoV-2 by FDA under an Emergency Use Authorization (EUA). This EUA will remain in effect (meaning this test can be used) for the duration of the COVID-19 declaration under Section 564(b)(1) of the Act, 21 U.S.C. section 360bbb-3(b)(1), unless the authorization is terminated or revoked.  Performed at [facility], [place] [address] [place], [place] [address]   Culture, blood (Routine X 2) w Reflex to ID Panel     Status: None (Preliminary result)   Collection Time: 12/25/20  3:44 PM   Specimen: BLOOD RIGHT FOREARM  Result Value Ref Range Status   Specimen Description BLOOD RIGHT FOREARM  Final   Special Requests   Final    BOTTLES DRAWN AEROBIC AND ANAEROBIC Blood Culture adequate volume Performed at [facility], [address] [place], [place] [address]    Culture PENDING  Incomplete   Report Status PENDING  Incomplete  Resp Panel by RT-PCR (Flu A&B, Covid) Nasopharyngeal Swab     Status: None   Collection Time: 12/25/20  4:15 PM   Specimen: Nasopharyngeal Swab; Nasopharyngeal(NP) swabs in vial transport medium  Result Value Ref Range Status   SARS Coronavirus 2 by RT PCR NEGATIVE NEGATIVE Final    Comment: (NOTE) SARS-CoV-2 target nucleic acids are NOT DETECTED.  The SARS-CoV-2 RNA is generally detectable in upper respiratory specimens during the acute phase of infection. The lowest concentration of SARS-CoV-2 viral copies this assay can detect is 138 copies/mL. A negative result does not preclude SARS-Cov-2 infection and should not be used as the sole basis for treatment or other patient management decisions. A negative result may occur with  improper specimen collection/handling, submission of specimen other than nasopharyngeal swab, presence of viral mutation(s) within the areas targeted by this assay, and inadequate number of viral copies(<138 copies/mL). A negative result must be combined with clinical observations, patient history, and epidemiological information. The expected result is Negative.  Fact Sheet for Patients:  EntrepreneurPulse.com.au  Fact Sheet for Healthcare Providers:  IncredibleEmployment.be  This test is no t yet approved or cleared by the [place] FDA and  has been authorized for detection and/or diagnosis of SARS-CoV-2 by FDA  under an Emergency Use Authorization (EUA). This EUA will remain  in effect (meaning this test can be used) for the duration of the COVID-19 declaration under Section 564(b)(1) of the Act, 21 U.S.C.section 360bbb-3(b)(1), unless the authorization is terminated  or revoked sooner.       Influenza A by PCR NEGATIVE NEGATIVE Final   Influenza B by PCR NEGATIVE NEGATIVE Final    Comment: (NOTE) The Xpert Xpress SARS-CoV-2/FLU/RSV plus assay is intended as an aid in the diagnosis of influenza  from Nasopharyngeal swab specimens and should not be used as a sole basis for treatment. Nasal washings and aspirates are unacceptable for Xpert Xpress SARS-CoV-2/FLU/RSV testing.  Fact Sheet for Patients: EntrepreneurPulse.com.au  Fact Sheet for Healthcare Providers: IncredibleEmployment.be  This test is not yet approved or cleared by the [place] FDA and has been authorized for detection and/or diagnosis of SARS-CoV-2 by FDA under an Emergency Use Authorization (EUA). This EUA will remain in effect (meaning this test can be used) for the duration of the COVID-19 declaration under Section 564(b)(1) of the Act, 21 U.S.C. section 360bbb-3(b)(1), unless the authorization is terminated or revoked.  Performed at [facility], [address], [place], [place] [address]   Culture, blood (Routine X 2) w Reflex to ID Panel     Status: None (Preliminary result)   Collection Time: 12/25/20  4:24 PM   Specimen: BLOOD LEFT HAND  Result Value Ref Range Status   Specimen Description BLOOD LEFT HAND  Final   Special Requests   Final    BOTTLES DRAWN AEROBIC AND ANAEROBIC Blood Culture results may not be optimal due to an excessive volume of blood received in culture bottles Performed at [facility], [address] [place], [place] [address]    Culture PENDING  Incomplete   Report Status PENDING  Incomplete     Radiological Exams on Admission: DG Chest Port 1  View  Result Date: 12/25/2020 CLINICAL DATA:  Hematuria and fever. EXAM: PORTABLE CHEST 1 VIEW COMPARISON:  December 01, 2020 FINDINGS: Mild atelectasis and/or infiltrate is seen within the bilateral lung bases. There is no evidence of a pleural effusion or pneumothorax. The heart size and mediastinal contours are within normal limits. Radiopaque fusion plates and screws are seen overlying the cervical spine. IMPRESSION: Mild bibasilar atelectasis and/or infiltrate. Electronically Signed   By: Virgina Norfolk M.D.   On: 12/25/2020 16:56    EKG: Independently reviewed.  Sinus rhythm with PVCs.  No acute ST changes.  Assessment/Plan: Principal Problem:   Acute lower UTI Active Problems:   Type 2 diabetes mellitus without complication, with long-term current use of insulin (HCC)   Benign prostatic hyperplasia with urinary retention   Hypertension   History of stroke    This patient was discussed with the ED physician, including pertinent vitals, physical exam findings, labs, and imaging.  We also discussed care given by the ED provider.  Acute lower UTI with questionable metabolic encephalopathy Admit Rocephin Urine culture pending CBC in AM Type 2 diabetes without complication SSI CBGs BPH Flomax Hypertension Continue antihypertensives History of stroke ASA History of NSTEMI ASA, lipitor, bb  DVT prophylaxis: Lovenox Consultants: none Code Status: full Family Communication:   Disposition Plan: return to [facility] home   [place], Tanna Savoy, DO

## 2020-12-25 NOTE — ED Triage Notes (Signed)
Patient arrives via [facility].  Patient brought in for blood in urinary catheter that was found this morning.  Patient is conscious and alert at triage with no pain.

## 2020-12-26 ENCOUNTER — Encounter (HOSPITAL_COMMUNITY): Payer: Self-pay | Admitting: Family Medicine

## 2020-12-26 DIAGNOSIS — N390 Urinary tract infection, site not specified: Secondary | ICD-10-CM | POA: Diagnosis not present

## 2020-12-26 LAB — BLOOD CULTURE ID PANEL (REFLEXED) - BCID2

## 2020-12-26 LAB — CBC
HCT: 33.9 % — ABNORMAL LOW (ref 39.0–52.0)
Hemoglobin: 11 g/dL — ABNORMAL LOW (ref 13.0–17.0)
MCH: 29.3 pg (ref 26.0–34.0)
MCHC: 32.4 g/dL (ref 30.0–36.0)
MCV: 90.4 fL (ref 80.0–100.0)
Platelets: 288 10*3/uL (ref 150–400)
RBC: 3.75 MIL/uL — ABNORMAL LOW (ref 4.22–5.81)
RDW: 13.7 % (ref 11.5–15.5)
WBC: 16.6 10*3/uL — ABNORMAL HIGH (ref 4.0–10.5)
nRBC: 0 % (ref 0.0–0.2)

## 2020-12-26 LAB — BASIC METABOLIC PANEL
Anion gap: 9 (ref 5–15)
BUN: 13 mg/dL (ref 8–23)
CO2: 22 mmol/L (ref 22–32)
Calcium: 7.9 mg/dL — ABNORMAL LOW (ref 8.9–10.3)
Chloride: 102 mmol/L (ref 98–111)
Creatinine, Ser: 1.02 mg/dL (ref 0.61–1.24)
GFR, Estimated: >60 mL/min (ref >60)
Glucose, Bld: 176 mg/dL — ABNORMAL HIGH (ref 70–99)
Potassium: 3.4 mmol/L — ABNORMAL LOW (ref 3.5–5.1)
Sodium: 133 mmol/L — ABNORMAL LOW (ref 135–145)

## 2020-12-26 LAB — GLUCOSE, CAPILLARY
Glucose-Capillary: 201 mg/dL — ABNORMAL HIGH (ref 70–99)
Glucose-Capillary: 107 mg/dL — ABNORMAL HIGH (ref 70–99)
Glucose-Capillary: 172 mg/dL — ABNORMAL HIGH (ref 70–99)
Glucose-Capillary: 149 mg/dL — ABNORMAL HIGH (ref 70–99)

## 2020-12-26 MED ORDER — KETOROLAC TROMETHAMINE 30 MG/ML IJ SOLN
30.0000 mg | Freq: Once | INTRAMUSCULAR | Status: AC
  Administered 2020-12-26: 30 mg via INTRAVENOUS
  Filled 2020-12-26: qty 1

## 2020-12-26 MED ORDER — PHENOL 1.4 % MT LIQD: 1.0000 | OROMUCOSAL | Status: DC | PRN

## 2020-12-26 MED ORDER — POTASSIUM CHLORIDE CRYS ER 20 MEQ PO TBCR
40.0000 meq | EXTENDED_RELEASE_TABLET | Freq: Once | ORAL | Status: AC
  Administered 2020-12-26: 40 meq via ORAL
  Filled 2020-12-26: qty 2

## 2020-12-26 MED ORDER — CHLORHEXIDINE GLUCONATE CLOTH 2 % EX PADS
6.0000 | MEDICATED_PAD | Freq: Every day | CUTANEOUS | Status: DC
  Administered 2020-12-26 – 2021-01-11 (×14): 6 via TOPICAL

## 2020-12-26 NOTE — Progress Notes (Signed)
PHARMACY - PHYSICIAN COMMUNICATION CRITICAL VALUE ALERT - BLOOD CULTURE IDENTIFICATION (BCID)  Albert Garrison is an 75 y.o. male who presented to [facility] on 12/25/2020   Assessment:  group A strep BCID in 1/2 sets   Name of physician (or Provider) Contacted: Dr. Manuella Ghazi  Current antibiotics: Ceftriaxone 2 grams IV daily  Changes to prescribed antibiotics recommended:    Results for orders placed or performed during the hospital encounter of 12/25/20  Blood Culture ID Panel (Reflexed) (Collected: 12/25/2020  3:44 PM)  Result Value Ref Range   Enterococcus faecalis NOT DETECTED NOT DETECTED   Enterococcus Faecium NOT DETECTED NOT DETECTED   Listeria monocytogenes NOT DETECTED NOT DETECTED   Staphylococcus species NOT DETECTED NOT DETECTED   Staphylococcus aureus (BCID) NOT DETECTED NOT DETECTED   Staphylococcus epidermidis NOT DETECTED NOT DETECTED   Staphylococcus lugdunensis NOT DETECTED NOT DETECTED   Streptococcus species DETECTED (A) NOT DETECTED   Streptococcus agalactiae NOT DETECTED NOT DETECTED   Streptococcus pneumoniae NOT DETECTED NOT DETECTED   Streptococcus pyogenes DETECTED (A) NOT DETECTED   A.calcoaceticus-baumannii NOT DETECTED NOT DETECTED   Bacteroides fragilis NOT DETECTED NOT DETECTED   Enterobacterales NOT DETECTED NOT DETECTED   Enterobacter cloacae complex NOT DETECTED NOT DETECTED   Escherichia coli NOT DETECTED NOT DETECTED   Klebsiella aerogenes NOT DETECTED NOT DETECTED   Klebsiella oxytoca NOT DETECTED NOT DETECTED   Klebsiella pneumoniae NOT DETECTED NOT DETECTED   Proteus species NOT DETECTED NOT DETECTED   Salmonella species NOT DETECTED NOT DETECTED   Serratia marcescens NOT DETECTED NOT DETECTED   Haemophilus influenzae NOT DETECTED NOT DETECTED   Neisseria meningitidis NOT DETECTED NOT DETECTED   Pseudomonas aeruginosa NOT DETECTED NOT DETECTED   Stenotrophomonas maltophilia NOT DETECTED NOT DETECTED   Candida albicans NOT DETECTED NOT  DETECTED   Candida auris NOT DETECTED NOT DETECTED   Candida glabrata NOT DETECTED NOT DETECTED   Candida krusei NOT DETECTED NOT DETECTED   Candida parapsilosis NOT DETECTED NOT DETECTED   Candida tropicalis NOT DETECTED NOT DETECTED   Cryptococcus neoformans/gattii NOT DETECTED NOT DETECTED    Ramond Craver 12/26/2020  2:45 PM

## 2020-12-26 NOTE — Progress Notes (Signed)
Dr. Josephine Cables made aware of temp 101.5 rectal after Tylenol given at 0521.  Awaiting further orders. Ayesha Mohair BSN RN CMSRN

## 2020-12-26 NOTE — Progress Notes (Addendum)
PROGRESS NOTE    Albert HAM  QAS:[id number] DOB: 07-26-1945 DOA: 12/25/2020 PCP: Albert Renshaw, MD   Brief Narrative:   GEROGE [facility]arrison is a 75 y.o. male with a history of type 2 diabetes, stage III chronic kidney disease, BPH, history of stroke, GERD, history of Cardiac Arrest.  The patient was recently admitted on 11/27/2020 and was discharged approximately 3 days ago from the hospital.  During that hospitalization, the patient went into cardiac arrest and had acute hypoxemic respiratory failure, he was intubated and ROSC was obtained after about 1 minute.  The patient now presents with hematuria.  He has been admitted with some acute metabolic encephalopathy in the setting of catheter associated UTI.  Assessment & Plan:   Principal Problem:   Acute lower UTI Active Problems:   Type 2 diabetes mellitus without complication, with long-term current use of insulin (HCC)   Benign prostatic hyperplasia with urinary retention   Hypertension   History of stroke   Acute metabolic encephalopathy versus dementia secondary to CAUTI -Patient noted to have hematuria on presentation -Continue Rocephin empirically with urine culture pending -Uncertain baseline mentation -He might have had some foley trauma at SNF according to the brother  Group A strep bacteremia -Plan to treat with 10-day course of antibiotics -Can switch to oral cefdinir on discharge -No need to evaluate further for endocarditis -Discussed case with Dr. Gale Journey with ID  Type 2 diabetes -DYS 2 diet -SSI  Mild hypokalemia -Replete and recheck  Mild hyponatremia -Keep on NS  BPH with Foley catheter -Plan to exchange Foley catheter -Continue Flomax  History of stroke -Continue aspirin -DYS 2 diet  History of NSTEMI -Continue aspirin, Lipitor, beta-blocker   DVT prophylaxis: Lovenox SCDs Code Status: Full Family Communication: Discussed with brother on phone 9/24 Disposition Plan:  Status is:  Inpatient  Remains inpatient appropriate because:IV treatments appropriate due to intensity of illness or inability to take PO and Inpatient level of care appropriate due to severity of illness  Dispo: The patient is from: SNF              Anticipated d/c is to: SNF              Patient currently is not medically stable to d/c.   Difficult to place patient No  Consultants:  None  Procedures:  See below  Antimicrobials:  Anti-infectives (From admission, onward)    Start     Dose/Rate Route Frequency Ordered Stop   12/26/20 1600  cefTRIAXone (ROCEPHIN) 2 g in sodium chloride 0.9 % 100 mL IVPB        2 g 200 mL/hr over 30 Minutes Intravenous Every 24 hours 12/25/20 2234     12/25/20 1800  cefTRIAXone (ROCEPHIN) 1 g in sodium chloride 0.9 % 100 mL IVPB        1 g 200 mL/hr over 30 Minutes Intravenous  Once 12/25/20 1755 12/25/20 1847   12/25/20 1515  cefTRIAXone (ROCEPHIN) 1 g in sodium chloride 0.9 % 100 mL IVPB        1 g 200 mL/hr over 30 Minutes Intravenous  Once 12/25/20 1510 12/25/20 1635   12/25/20 0000  cephALEXin (KEFLEX) 500 MG capsule        500 mg Oral 4 times daily 12/25/20 1533        Subjective: Patient seen and evaluated today with no new acute complaints or concerns. He still remains confused. No acute concerns or events noted overnight.  Objective: Vitals:  12/26/20 0524 12/26/20 0631 12/26/20 0833 12/26/20 0837  BP: 140/78  (!) 119/97 (!) 119/97  Pulse: 77  79 79  Resp: 18  20   Temp: (!) 101.7 F (38.7 C) (!) 101.5 F (38.6 C) 97.8 F (36.6 C)   TempSrc: Rectal Rectal Oral   SpO2: 98%  99%   Weight:      Height:        Intake/Output Summary (Last 24 hours) at 12/26/2020 1049 Last data filed at 12/26/2020 0527 Gross per 24 hour  Intake 1099.36 ml  Output 1650 ml  Net -550.64 ml   Filed Weights   12/25/20 2058  Weight: 76.5 kg    Examination:  General exam: Appears calm and comfortable, confused Respiratory system: Clear to auscultation.  Respiratory effort normal. Cardiovascular system: S1 & S2 heard, RRR.  Gastrointestinal system: Abdomen is soft Central nervous system: Alert and awake Extremities: No edema Skin: No significant lesions noted Psychiatry: Flat affect. Foley with clear, yellow UO    Data Reviewed: I have personally reviewed following labs and imaging studies  CBC: Recent Labs  Lab 12/25/20 1624 12/26/20 0606  WBC 20.6* 16.6*  NEUTROABS 17.2*  --   HGB 12.3* 11.0*  HCT 37.3* 33.9*  MCV 91.0 90.4  PLT 302 151   Basic Metabolic Panel: Recent Labs  Lab 12/25/20 1624 12/26/20 0606  NA 132* 133*  K 3.8 3.4*  CL 102 102  CO2 20* 22  GLUCOSE 229* 176*  BUN 15 13  CREATININE 1.13 1.02  CALCIUM 8.1* 7.9*   GFR: Estimated Creatinine Clearance: 65.6 mL/min (by C-G formula based on SCr of 1.02 mg/dL). Liver Function Tests: Recent Labs  Lab 12/25/20 1624  AST 22  ALT 18  ALKPHOS 89  BILITOT 0.7  PROT 6.8  ALBUMIN 3.0*   No results for input(s): LIPASE, AMYLASE in the last 168 hours. No results for input(s): AMMONIA in the last 168 hours. Coagulation Profile: No results for input(s): INR, PROTIME in the last 168 hours. Cardiac Enzymes: No results for input(s): CKTOTAL, CKMB, CKMBINDEX, TROPONINI in the last 168 hours. BNP (last 3 results) No results for input(s): PROBNP in the last 8760 hours. HbA1C: No results for input(s): HGBA1C in the last 72 hours. CBG: Recent Labs  Lab 12/22/20 2027 12/23/20 0607 12/23/20 1107 12/25/20 2101 12/26/20 1018  GLUCAP 233* 246* 236* 183* 201*   Lipid Profile: No results for input(s): CHOL, HDL, LDLCALC, TRIG, CHOLHDL, LDLDIRECT in the last 72 hours. Thyroid Function Tests: No results for input(s): TSH, T4TOTAL, FREET4, T3FREE, THYROIDAB in the last 72 hours. Anemia Panel: No results for input(s): VITAMINB12, FOLATE, FERRITIN, TIBC, IRON, RETICCTPCT in the last 72 hours. Sepsis Labs: Recent Labs  Lab 12/25/20 1624  LATICACIDVEN 1.6     Recent Results (from the past 240 hour(s))  Resp Panel by RT-PCR (Flu A&B, Covid) Nasopharyngeal Swab     Status: None   Collection Time: 12/23/20 11:37 AM   Specimen: Nasopharyngeal Swab; Nasopharyngeal(NP) swabs in vial transport medium  Result Value Ref Range Status   SARS Coronavirus 2 by RT PCR NEGATIVE NEGATIVE Final    Comment: (NOTE) SARS-CoV-2 target nucleic acids are NOT DETECTED.  The SARS-CoV-2 RNA is generally detectable in upper respiratory specimens during the acute phase of infection. The lowest concentration of SARS-CoV-2 viral copies this assay can detect is 138 copies/mL. A negative result does not preclude SARS-Cov-2 infection and should not be used as the sole basis for treatment or other patient management decisions.  A negative result may occur with  improper specimen collection/handling, submission of specimen other than nasopharyngeal swab, presence of viral mutation(s) within the areas targeted by this assay, and inadequate number of viral copies(<138 copies/mL). A negative result must be combined with clinical observations, patient history, and epidemiological information. The expected result is Negative.  Fact Sheet for Patients:  EntrepreneurPulse.com.au  Fact Sheet for Healthcare Providers:  IncredibleEmployment.be  This test is no t yet approved or cleared by the [place] FDA and  has been authorized for detection and/or diagnosis of SARS-CoV-2 by FDA under an Emergency Use Authorization (EUA). This EUA will remain  in effect (meaning this test can be used) for the duration of the COVID-19 declaration under Section 564(b)(1) of the Act, 21 U.S.C.section 360bbb-3(b)(1), unless the authorization is terminated  or revoked sooner.       Influenza A by PCR NEGATIVE NEGATIVE Final   Influenza B by PCR NEGATIVE NEGATIVE Final    Comment: (NOTE) The Xpert Xpress SARS-CoV-2/FLU/RSV plus assay is intended as an  aid in the diagnosis of influenza from Nasopharyngeal swab specimens and should not be used as a sole basis for treatment. Nasal washings and aspirates are unacceptable for Xpert Xpress SARS-CoV-2/FLU/RSV testing.  Fact Sheet for Patients: EntrepreneurPulse.com.au  Fact Sheet for Healthcare Providers: IncredibleEmployment.be  This test is not yet approved or cleared by the [place] FDA and has been authorized for detection and/or diagnosis of SARS-CoV-2 by FDA under an Emergency Use Authorization (EUA). This EUA will remain in effect (meaning this test can be used) for the duration of the COVID-19 declaration under Section 564(b)(1) of the Act, 21 U.S.C. section 360bbb-3(b)(1), unless the authorization is terminated or revoked.  Performed at [facility], [place] [address], [place], [place] [address]   Culture, blood (Routine X 2) w Reflex to ID Panel     Status: None (Preliminary result)   Collection Time: 12/25/20  3:44 PM   Specimen: BLOOD RIGHT FOREARM  Result Value Ref Range Status   Specimen Description BLOOD RIGHT FOREARM  Final   Special Requests   Final    BOTTLES DRAWN AEROBIC AND ANAEROBIC Blood Culture adequate volume   Culture  Setup Time   Final    GRAM POSITIVE COCCI IN BOTH AEROBIC AND ANAEROBIC BOTTLES Gram Stain Report Called to,Read Back By and Verified With: WATSON @ [id number] ON [id number] BY HENDERSON L    Culture   Final    NO GROWTH < 24 HOURS Performed at [facility], [address] [place], [place] [address]    Report Status PENDING  Incomplete  Resp Panel by RT-PCR (Flu A&B, Covid) Nasopharyngeal Swab     Status: None   Collection Time: 12/25/20  4:15 PM   Specimen: Nasopharyngeal Swab; Nasopharyngeal(NP) swabs in vial transport medium  Result Value Ref Range Status   SARS Coronavirus 2 by RT PCR NEGATIVE NEGATIVE Final    Comment: (NOTE) SARS-CoV-2 target nucleic acids are NOT DETECTED.  The SARS-CoV-2 RNA  is generally detectable in upper respiratory specimens during the acute phase of infection. The lowest concentration of SARS-CoV-2 viral copies this assay can detect is 138 copies/mL. A negative result does not preclude SARS-Cov-2 infection and should not be used as the sole basis for treatment or other patient management decisions. A negative result may occur with  improper specimen collection/handling, submission of specimen other than nasopharyngeal swab, presence of viral mutation(s) within the areas targeted by this assay, and inadequate number of viral  copies(<138 copies/mL). A negative result must be combined with clinical observations, patient history, and epidemiological information. The expected result is Negative.  Fact Sheet for Patients:  EntrepreneurPulse.com.au  Fact Sheet for Healthcare Providers:  IncredibleEmployment.be  This test is no t yet approved or cleared by the [place] FDA and  has been authorized for detection and/or diagnosis of SARS-CoV-2 by FDA under an Emergency Use Authorization (EUA). This EUA will remain  in effect (meaning this test can be used) for the duration of the COVID-19 declaration under Section 564(b)(1) of the Act, 21 U.S.C.section 360bbb-3(b)(1), unless the authorization is terminated  or revoked sooner.       Influenza A by PCR NEGATIVE NEGATIVE Final   Influenza B by PCR NEGATIVE NEGATIVE Final    Comment: (NOTE) The Xpert Xpress SARS-CoV-2/FLU/RSV plus assay is intended as an aid in the diagnosis of influenza from Nasopharyngeal swab specimens and should not be used as a sole basis for treatment. Nasal washings and aspirates are unacceptable for Xpert Xpress SARS-CoV-2/FLU/RSV testing.  Fact Sheet for Patients: EntrepreneurPulse.com.au  Fact Sheet for Healthcare Providers: IncredibleEmployment.be  This test is not yet approved or cleared by the  [place] FDA and has been authorized for detection and/or diagnosis of SARS-CoV-2 by FDA under an Emergency Use Authorization (EUA). This EUA will remain in effect (meaning this test can be used) for the duration of the COVID-19 declaration under Section 564(b)(1) of the Act, 21 U.S.C. section 360bbb-3(b)(1), unless the authorization is terminated or revoked.  Performed at [facility], [address] [place], [place] [address]   Culture, blood (Routine X 2) w Reflex to ID Panel     Status: None (Preliminary result)   Collection Time: 12/25/20  4:24 PM   Specimen: BLOOD LEFT HAND  Result Value Ref Range Status   Specimen Description BLOOD LEFT HAND  Final   Special Requests   Final    BOTTLES DRAWN AEROBIC AND ANAEROBIC Blood Culture results may not be optimal due to an excessive volume of blood received in culture bottles   Culture   Final    NO GROWTH < 24 HOURS Performed at [facility], [address], [place], [place] [address]    Report Status PENDING  Incomplete         Radiology Studies: DG Chest Port 1 View  Result Date: 12/25/2020 CLINICAL DATA:  Hematuria and fever. EXAM: PORTABLE CHEST 1 VIEW COMPARISON:  December 01, 2020 FINDINGS: Mild atelectasis and/or infiltrate is seen within the bilateral lung bases. There is no evidence of a pleural effusion or pneumothorax. The heart size and mediastinal contours are within normal limits. Radiopaque fusion plates and screws are seen overlying the cervical spine. IMPRESSION: Mild bibasilar atelectasis and/or infiltrate. Electronically Signed   By: Virgina Norfolk M.D.   On: 12/25/2020 16:56        Scheduled Meds:  amLODipine  10 mg Oral Daily   aspirin  81 mg Oral Daily   atorvastatin  40 mg Oral q1800   Chlorhexidine Gluconate Cloth  6 each Topical Daily   insulin aspart  0-15 Units Subcutaneous TID WC   metoprolol tartrate  25 mg Oral BID   potassium chloride  40 mEq Oral Once   tamsulosin  0.4 mg Oral QPC supper    Continuous Infusions:  sodium chloride 75 mL/hr at 12/26/20 0400   cefTRIAXone (ROCEPHIN)  IV       LOS: 1 day    Time spent: 35 minutes  Miklo Aken Darleen Crocker, DO [facility]  If 7PM-7AM, please contact night-coverage [email or web address] 12/26/2020, 10:49 AM

## 2020-12-27 DIAGNOSIS — N390 Urinary tract infection, site not specified: Secondary | ICD-10-CM | POA: Diagnosis not present

## 2020-12-27 LAB — BASIC METABOLIC PANEL
Anion gap: 7 (ref 5–15)
BUN: 11 mg/dL (ref 8–23)
CO2: 23 mmol/L (ref 22–32)
Calcium: 8 mg/dL — ABNORMAL LOW (ref 8.9–10.3)
Chloride: 104 mmol/L (ref 98–111)
Creatinine, Ser: 1.03 mg/dL (ref 0.61–1.24)
GFR, Estimated: >60 mL/min (ref >60)
Glucose, Bld: 147 mg/dL — ABNORMAL HIGH (ref 70–99)
Potassium: 3.6 mmol/L (ref 3.5–5.1)
Sodium: 134 mmol/L — ABNORMAL LOW (ref 135–145)

## 2020-12-27 LAB — GLUCOSE, CAPILLARY
Glucose-Capillary: 156 mg/dL — ABNORMAL HIGH (ref 70–99)
Glucose-Capillary: 175 mg/dL — ABNORMAL HIGH (ref 70–99)
Glucose-Capillary: 235 mg/dL — ABNORMAL HIGH (ref 70–99)
Glucose-Capillary: 156 mg/dL — ABNORMAL HIGH (ref 70–99)

## 2020-12-27 LAB — CBC
HCT: 35.2 % — ABNORMAL LOW (ref 39.0–52.0)
Hemoglobin: 11.4 g/dL — ABNORMAL LOW (ref 13.0–17.0)
MCH: 29.5 pg (ref 26.0–34.0)
MCHC: 32.4 g/dL (ref 30.0–36.0)
MCV: 91.2 fL (ref 80.0–100.0)
Platelets: 272 10*3/uL (ref 150–400)
RBC: 3.86 MIL/uL — ABNORMAL LOW (ref 4.22–5.81)
RDW: 13.6 % (ref 11.5–15.5)
WBC: 8.3 10*3/uL (ref 4.0–10.5)
nRBC: 0 % (ref 0.0–0.2)

## 2020-12-27 LAB — URINE CULTURE: Culture: 80000 — AB

## 2020-12-27 LAB — MAGNESIUM: Magnesium: 1.8 mg/dL (ref 1.7–2.4)

## 2020-12-27 MED ORDER — CEFDINIR 300 MG PO CAPS: 300.0000 mg | ORAL_CAPSULE | Freq: Two times a day (BID) | ORAL | 0 refills | Status: DC

## 2020-12-27 MED ORDER — POTASSIUM CHLORIDE CRYS ER 20 MEQ PO TBCR
40.0000 meq | EXTENDED_RELEASE_TABLET | Freq: Once | ORAL | Status: AC
  Administered 2020-12-27: 40 meq via ORAL
  Filled 2020-12-27: qty 2

## 2020-12-27 NOTE — Evaluation (Signed)
Physical Therapy Evaluation Patient Details Name: Albert Garrison MRN: [id number] DOB: 1945-11-30 Today's Date: 12/27/2020  History of Present Illness  Albert Garrison is a 75 y.o. male with a history of type 2 diabetes, stage III chronic kidney disease, BPH, history of stroke, GERD, history of Cardiac Arrest.  The patient was recently admitted on 11/27/2020 and was discharged approximately 3 days ago from the [facility].  During that hospitalization, the patient went into cardiac arrest and had acute hypoxemic respiratory failure, he was intubated and ROSC was obtained after about 1 minute.  The patient presented this time with acute metabolic encephalopathy suspected to be related to UTI as well as some noted hematuria on presentation.  His urine cultures have demonstrated no growth, however he has had group A strep bacteremia noted and case was discussed with ID with recommendations to remain on oral cefdinir for 8 more days to complete 10-day course of treatment.  He has had improvement in leukocytosis as well as his mentation and has remained afebrile during the course of the  Clinical Impression  PT unable to tell therapist the correct date or why he is in the [facility].  Pt is confused and will need 24 hr supervision for safety.        Recommendations for follow up therapy are one component of a multi-disciplinary discharge planning process, led by the attending physician.  Recommendations may be updated based on patient status, additional functional criteria and insurance authorization.  Follow Up Recommendations Supervision/Assistance - 24 hour;SNF    Equipment Recommendations  Rolling walker with 5" wheels    Recommendations for Other Services       Precautions / Restrictions Precautions Precautions: Fall Precaution Comments: PT was stable walking with a RW x 100 ft but stated that he was becoming dizzy at thiat time.      Mobility  Bed Mobility Overal bed mobility: Modified  Independent       Supine to sit: Modified independent (Device/Increase time) Sit to supine: Modified independent (Device/Increase time)        Transfers Overall transfer level: Modified independent                  Ambulation/Gait Ambulation/Gait assistance: Modified independent (Device/Increase time) Gait Distance (Feet): 100 Feet Assistive device: Rolling walker (2 wheeled)       General Gait Details: decreased step length ; pt dizzy after 100 ft of ambulation        Pertinent Vitals/Pain Pain Assessment: No/denies pain Faces Pain Scale: No hurt    Home Living Family/patient expects to be discharged to:: Skilled nursing facility Living Arrangements: Alone Available Help at Discharge: Family Type of Home: Apartment Home Access: Level entry     Home Layout: One level Home Equipment: None      Prior Function   Pt states he lives alone in [place]                  Extremity/Trunk Assessment        Lower Extremity Assessment Lower Extremity Assessment: Overall Albert Garrison for tasks assessed       Communication      Cognition Arousal/Alertness: Awake/alert   Overall Cognitive Status: No family/caregiver present to determine baseline cognitive functioning Area of Impairment: Memory                 Orientation Level: Time;Situation Current Attention Level: Focused;Sustained   Following Commands: Follows one step commands consistently  Assessment/Plan    PT Assessment Patient needs continued PT services  PT Problem List Decreased activity tolerance;Decreased balance       PT Treatment Interventions Gait training;Stair training;Functional mobility training;Therapeutic activities;Balance training;Patient/family education    PT Goals (Current goals can be found in the Care Plan section)       Frequency Min 2X/week   Barriers to discharge        Co-evaluation               AM-PAC PT "6  Clicks" Mobility  Outcome Measure Help needed turning from your back to your side while in a flat bed without using bedrails?: None Help needed moving from lying on your back to sitting on the side of a flat bed without using bedrails?: None Help needed moving to and from a bed to a chair (including a wheelchair)?: None Help needed standing up from a chair using your arms (e.g., wheelchair or bedside chair)?: None Help needed to walk in [facility] room?: A Little Help needed climbing 3-5 steps with a railing? : A Lot 6 Click Score: 21    End of Session Equipment Utilized During Treatment: Gait belt Activity Tolerance: Patient tolerated treatment well;Patient limited by fatigue Patient left: in bed;with call bell/phone within reach Nurse Communication: Mobility status PT Visit Diagnosis: Unsteadiness on feet (R26.81);Other abnormalities of gait and mobility (R26.89)    Time: 3735-7897 PT Time Calculation (min) (ACUTE ONLY): 28 min   Charges:   PT Evaluation $PT Eval Low Complexity: [place], PT CLT [phone]  12/27/2020, 1:43 PM

## 2020-12-27 NOTE — TOC Progression Note (Signed)
Transition of Care [facility]) - Progression Note    Patient Details  Name: Albert Garrison MRN: [id number] Date of Birth: [birth year]  [facility] of [facility] CM/SW Contact  Natasha Bence, LCSW Phone Number: 12/27/2020, 3:49 PM  Clinical Narrative:    CSW restarted auth once PT note received. Auth pending. CSW also requested continued stay auth to be cancelled due to patient's readmission to the hospital. [facility] health rep agreeable. Debbie with [facility] agreeable to take patient once new auth received. TOC to follow.         Expected Discharge Plan and Services           Expected Discharge Date: 12/27/20                                     Social Determinants of Health (SDOH) Interventions    Readmission Risk Interventions No flowsheet data found.

## 2020-12-27 NOTE — Discharge Summary (Addendum)
Physician Discharge Summary  Albert Garrison YJE:[id number] DOB: Feb 04, 1946 DOA: 12/25/2020  PCP: Caprice Renshaw, MD  Admit date: 12/25/2020  Discharge date: 12/28/2020  Admitted From:SNF  Disposition:  SNF  Recommendations for Outpatient Follow-up:  Follow up with PCP in 1-2 weeks Continue on cefdinir as prescribed for 8 more days for treatment of group A strep bacteremia Continue Foley catheter as previous and follow-up with urology outpatient with referral sent regarding voiding trials.  No further hematuria noted. Continue home medications as prior  Home Health: None  Equipment/Devices: Has Foley catheter  Discharge Condition:Stable  CODE STATUS: Full  Diet recommendation: Dysphagia 2 diet  Brief/Interim Summary:  Albert Garrison is a 75 y.o. male with a history of type 2 diabetes, stage III chronic kidney disease, BPH, history of stroke, GERD, history of Cardiac Arrest.  The patient was recently admitted on 11/27/2020 and was discharged approximately 3 days ago from the hospital.  During that hospitalization, the patient went into cardiac arrest and had acute hypoxemic respiratory failure, he was intubated and ROSC was obtained after about 1 minute.  The patient presented this time with acute metabolic encephalopathy suspected to be related to UTI as well as some noted hematuria on presentation.  His urine cultures have demonstrated no growth, however he has had group A strep bacteremia noted and case was discussed with ID with recommendations to remain on oral cefdinir for 8 more days to complete 10-day course of treatment.  He has had improvement in leukocytosis as well as his mentation and has remained afebrile during the course of the stay.  He will need to follow-up with urology outpatient for voiding trials and removal of Foley catheter.  No other acute events noted throughout the course of this admission.  Discharge Diagnoses:  Principal Problem:   Acute lower UTI Active  Problems:   Type 2 diabetes mellitus without complication, with long-term current use of insulin (HCC)   Benign prostatic hyperplasia with urinary retention   Hypertension   History of stroke  Principal discharge diagnosis: Acute metabolic encephalopathy likely secondary to CAUTI in the setting of possible dementia.  Group A strep bacteremia.  Discharge Instructions  Discharge Instructions     Ambulatory referral to Urology   Complete by: As directed    Diet - low sodium heart healthy   Complete by: As directed    Increase activity slowly   Complete by: As directed       Allergies as of 12/28/2020   No Known Allergies      Medication List     STOP taking these medications    insulin glargine 100 UNIT/ML injection Commonly known as: LANTUS       TAKE these medications    acetaminophen 500 MG tablet Commonly known as: TYLENOL Take 2 tablets (1,000 mg total) by mouth every 8 (eight) hours as needed for up to 10 days.   amLODipine 10 MG tablet Commonly known as: NORVASC Take 1 tablet (10 mg total) by mouth daily.   aspirin 81 MG chewable tablet Chew 1 tablet (81 mg total) by mouth daily.   atorvastatin 40 MG tablet Commonly known as: LIPITOR Take 40 mg by mouth daily.   cefdinir 300 MG capsule Commonly known as: OMNICEF Take 1 capsule (300 mg total) by mouth 2 (two) times daily for 8 days.   haloperidol 0.5 MG tablet Commonly known as: HALDOL Take 1 tablet (0.5 mg total) by mouth 2 (two) times daily.   insulin aspart 100  UNIT/ML injection Commonly known as: novoLOG Inject 0-15 Units into the skin 3 (three) times daily with meals. CBG < 70: Implement Hypoglycemia Standing Orders and refer to Hypoglycemia Standing Orders sidebar report  CBG 70 - 120: 0 units  CBG 121 - 150: 2 units  CBG 151 - 200: 3 units  CBG 201 - 250: 5 units  CBG 251 - 300: 8 units  CBG 301 - 350: 11 units  CBG 351 - 400: 15 units   metFORMIN 500 MG tablet Commonly known as:  GLUCOPHAGE Take 500 mg by mouth daily with breakfast.   metoprolol tartrate 25 MG tablet Commonly known as: LOPRESSOR Take 1 tablet (25 mg total) by mouth 2 (two) times daily.   multivitamin with minerals Tabs tablet Take 1 tablet by mouth daily.   tamsulosin 0.4 MG Caps capsule Commonly known as: FLOMAX Take 1 capsule (0.4 mg total) by mouth daily after supper.        Follow-up Information     Caprice Renshaw, MD. Schedule an appointment as soon as possible for a visit .   Specialty: Internal Medicine Contact information: [place] [address] [place]. Schedule an appointment as soon as possible for a visit .   Specialty: Urology Contact information: [address] [place] [phone]               No Known Allergies  Consultations: None   Procedures/Studies: DG Chest 1 View  Result Date: 11/29/2020 CLINICAL DATA:  Status post intubation. EXAM: CHEST  1 VIEW COMPARISON:  Chest radiograph dated 11/29/2020. FINDINGS: Endotracheal tube approximately 7 cm above the carina. Enteric tube extends below the diaphragm with tip in the left upper abdomen likely in the gastric fundus. Bibasilar streaky atelectasis. Developing infiltrate is less likely. No focal consolidation, pleural effusion or pneumothorax. Stable cardiac silhouette. Probable hiatal hernia. No acute osseous pathology. Cervical fusion hardware. IMPRESSION: Endotracheal tube above the carina. Electronically Signed   By: Anner Crete M.D.   On: 11/29/2020 23:10   DG Abd 1 View  Result Date: 11/30/2020 CLINICAL DATA:  Bowel obstruction EXAM: ABDOMEN - 1 VIEW COMPARISON:  CT 11/29/2020 FINDINGS: Esophageal tube tip looped over the stomach. Persistent dilatation of small bowel measuring up to 5 cm consistent with bowel obstruction. IMPRESSION: Persistent dilatation of small bowel consistent with obstruction.  Electronically Signed   By: Donavan Foil M.D.   On: 11/30/2020 23:44   CT HEAD WO CONTRAST (5MM)  Result Date: 11/29/2020 CLINICAL DATA:  Encephalopathy EXAM: CT HEAD WITHOUT CONTRAST TECHNIQUE: Contiguous axial images were obtained from the base of the skull through the vertex without intravenous contrast. COMPARISON:  08/22/2017 FINDINGS: Brain: There is no mass, hemorrhage or extra-axial collection. There is generalized atrophy without lobar predilection. Hypodensity of the white matter is most commonly associated with chronic microvascular disease. 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 Vascular: No abnormal hyperdensity of the major intracranial arteries or dural venous sinuses. No intracranial atherosclerosis. Skull: The visualized skull base, calvarium and extracranial soft tissues are normal. Sinuses/Orbits: No fluid levels or advanced mucosal thickening of the visualized paranasal sinuses. No mastoid or middle ear effusion. The orbits are normal. IMPRESSION: 1. No acute intracranial abnormality. 2. Generalized atrophy and chronic microvascular ischemia. Electronically Signed   By: Ulyses Jarred M.D.   On: 11/29/2020 19:31   MR BRAIN W WO CONTRAST  Result Date: 12/05/2020 CLINICAL DATA:  Anoxic brain damage. Additional history provided: Status post cardiac arrest. ROSC after 5 minutes. EXAM: MRI HEAD WITHOUT AND WITH CONTRAST TECHNIQUE: Multiplanar, multiecho pulse sequences of the brain and surrounding structures were obtained without and with intravenous contrast. CONTRAST:  53mL GADAVIST GADOBUTROL 1 MMOL/ML IV SOLN COMPARISON:  Head CT 11/29/2020.  MRI brain and MRA head 08/23/2017. FINDINGS: Brain: Mild generalized cerebral and cerebellar atrophy. Redemonstrated chronic small-vessel infarct within the left corona radiata/basal ganglia with associated chronic hemosiderin deposition at this site. Redemonstrated chronic lacunar infarct within the right thalamus. There is  no acute infarct. No evidence of an intracranial mass. No extra-axial fluid collection. No midline shift. No pathologic intracranial enhancement. 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 Flow voids otherwise preserved within the proximal large arterial vessels. Skull and upper cervical spine: No focal suspicious marrow lesion. Sinuses/Orbits: Visualized orbits show no acute finding. Near complete fluid opacification of the bilateral sphenoid sinuses. Fluid opacification of the posterior left ethmoid air cells. Small bilateral maxillary sinus mucous retention cysts. Other: Trace fluid within the bilateral mastoid air cells. IMPRESSION: No evidence of acute intracranial abnormality. Specifically, there is no evidence of acute anoxic brain injury at this time. Redemonstrated chronic small vessel infarct within the right corona radiata/basal ganglia with chronic hemosiderin deposition at this site. Redemonstrated chronic lacunar infarct within the right thalamus. 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 Paranasal sinus disease, as described. Bilateral mastoid effusions. Electronically Signed   By: Kellie Simmering D.O.   On: 12/05/2020 17:09   CT ABDOMEN PELVIS W CONTRAST  Result Date: 11/29/2020 CLINICAL DATA:  Bowel obstruction suspected. C.o decreased appetite, nausea, poor p.o. intake. He states that he was constipated for the last few days but last night began experiencing intermittent diarrhea. HX CKD, HTN, GI bleed, DM EXAM: CT ABDOMEN AND PELVIS WITH CONTRAST TECHNIQUE: Multidetector CT imaging of the abdomen and pelvis was performed using the standard protocol following bolus administration of intravenous contrast. CONTRAST:  57mL OMNIPAQUE IOHEXOL 350 MG/ML SOLN COMPARISON:  None. FINDINGS: Lines and tubes: Enteric tube noted coursing below the hemidiaphragm with tip within the gastric lumen and side port at the  gastroesophageal junction. Foley catheter tip and balloon terminate within the urinary bladder. Lower chest: Trace bilateral pleural effusions, left greater than right. Hepatobiliary: The hepatic parenchyma is diffusely hypodense compared to the splenic parenchyma consistent with fatty infiltration. No focal liver abnormality. No gallstones, gallbladder wall thickening, or pericholecystic fluid. No biliary dilatation. Pancreas: Diffusely atrophic. No focal lesion. Otherwise normal pancreatic contour. No surrounding inflammatory changes. No main pancreatic ductal dilatation. Spleen: Normal in size without focal abnormality. Adrenals/Urinary Tract: No adrenal nodule bilaterally. Bilateral kidneys enhance symmetrically. Bilateral renal cortical scarring. Pericentimeter fluid density lesion within the right kidney likely represents a simple renal cyst. Subcentimeter hypodensity within left kidney too small to characterize. No hydronephrosis. No hydroureter. The urinary bladder is decompressed. Stomach/Bowel: Stomach is within normal limits. Diffuse fluid dilatation of the proximal and mid small bowel with a likely transition point within the right mid abdomen (5:42). The distal small bowel is decompressed. The large bowel is decompressed. No evidence of large bowel wall thickening or dilatation. Diffuse left colon and sigmoid diverticulosis. Appendix appears normal. Vascular/Lymphatic: No abdominal aorta or iliac aneurysm. Severe atherosclerotic plaque of the aorta and its branches. No abdominal, pelvic, or inguinal lymphadenopathy. Reproductive: Prostate is unremarkable. Other: Trace free fluid  within the pelvis. No intraperitoneal free gas. No organized fluid collection. Musculoskeletal: Bilateral trace volume inguinal hernias. No suspicious lytic or blastic osseous lesions. No acute displaced fracture. Limited evaluation of the visualized ribs due to motion artifact. Multilevel degenerative changes of the spine.  IMPRESSION: 1. Small-bowel obstruction with a likely transition point within the right mid abdomen. 2. Enteric tube with tip within the gastric lumen side port but side port at the gastroesophageal junction. Consider advancing by 3 cm. 3. Colonic diverticulosis with no acute diverticulitis. 4. Bilateral trace pleural effusions. 5.  Aortic Atherosclerosis (ICD10-I70.0). Electronically Signed   By: Iven Finn M.D.   On: 11/29/2020 20:20   DG Chest Port 1 View  Result Date: 12/25/2020 CLINICAL DATA:  Hematuria and fever. EXAM: PORTABLE CHEST 1 VIEW COMPARISON:  December 01, 2020 FINDINGS: Mild atelectasis and/or infiltrate is seen within the bilateral lung bases. There is no evidence of a pleural effusion or pneumothorax. The heart size and mediastinal contours are within normal limits. Radiopaque fusion plates and screws are seen overlying the cervical spine. IMPRESSION: Mild bibasilar atelectasis and/or infiltrate. Electronically Signed   By: Virgina Norfolk M.D.   On: 12/25/2020 16:56   DG CHEST PORT 1 VIEW  Result Date: 12/01/2020 CLINICAL DATA:  Respiratory failure EXAM: PORTABLE CHEST 1 VIEW COMPARISON:  Previous day FINDINGS: Stable appearance of endotracheal tube midthoracic trachea. Cervical hardware partially visualized, grossly unchanged. Is a left jugular central venous catheter with tip terminating the SVC. Stable appearance of the cardiomediastinal silhouette. Probable small left pleural. No pneumothorax. Suspect right middle lobe opacity, similar. IMPRESSION: Stable appearance of lines and tubes. Similar appearance of airspace disease in the right middle lobe. Small left pleural effusion. Electronically Signed   By: Albin Felling M.D.   On: 12/01/2020 08:24   DG CHEST PORT 1 VIEW  Result Date: 11/30/2020 CLINICAL DATA:  Hypoxia EXAM: PORTABLE CHEST 1 VIEW COMPARISON:  11/30/2020, 11/29/2020, 11/28/2020 FINDINGS: Endotracheal tube tip about 3.9 cm superior to carina. Esophageal tube tip  overlies the stomach. Left IJ central venous catheter tip over the venous confluence. Probable small left-sided pleural effusion. Increased left basilar airspace disease. Increasing hazy right base airspace disease. Stable cardiomediastinal silhouette IMPRESSION: 1. Similar appearance of support lines and tubes. 2. Small left-sided pleural effusion with increasing airspace disease at left base. Slight increased right infrahilar airspace disease. Electronically Signed   By: Donavan Foil M.D.   On: 11/30/2020 23:46   DG CHEST PORT 1 VIEW  Result Date: 11/30/2020 CLINICAL DATA:  Status post PICC line placement EXAM: PORTABLE CHEST 1 VIEW COMPARISON:  11/29/2020 FINDINGS: Left IJ venous catheter terminates in the upper SVC. Endotracheal tube terminates 4 cm above the carina. Enteric tube terminates in the gastric cardia. No frank interstitial edema. Small bilateral pleural effusions, right greater than left. The heart is normal in size. Defibrillator pad overlying the left hemithorax. Cervical spine fixation hardware, incompletely visualized. IMPRESSION: Left IJ venous catheter terminates in the upper SVC. Otherwise unchanged. Electronically Signed   By: Julian Hy M.D.   On: 11/30/2020 02:14   DG CHEST PORT 1 VIEW  Result Date: 11/29/2020 CLINICAL DATA:  Altered mental status. NG tube placement verification. EXAM: PORTABLE CHEST 1 VIEW COMPARISON:  Chest x-ray dated 11/28/2020. FINDINGS: Enteric tube appears adequately positioned in the stomach. Heart size and mediastinal contours are stable. Lungs are clear. No pleural effusion is seen. IMPRESSION: Enteric tube appears adequately positioned in the stomach. No active disease. Electronically Signed   By:  Franki Cabot M.D.   On: 11/29/2020 09:51   DG CHEST PORT 1 VIEW  Result Date: 11/28/2020 CLINICAL DATA:  75 year old male status post nasogastric tube placement EXAM: PORTABLE CHEST 1 VIEW COMPARISON:  Prior radiograph obtained earlier today  FINDINGS: Interval placement of a nasogastric tube. The tube appears well positioned overlying the gastric fundus. Inspiratory volumes are low with bibasilar atelectasis. No evidence of pneumothorax or large effusion. Incompletely imaged dilated small bowel in the upper abdomen suggests underlying bowel obstruction. IMPRESSION: Well-positioned nasogastric tube. Electronically Signed   By: Jacqulynn Cadet M.D.   On: 11/28/2020 10:15   DG Abd Portable 1V  Result Date: 12/06/2020 CLINICAL DATA:  Small bowel obstruction. EXAM: PORTABLE ABDOMEN - 1 VIEW COMPARISON:  Abdominal radiographs dated 12/04/2020. FINDINGS: An enteric tube terminates in the stomach. No definite small bowel dilatation is identified. Air-fluid levels and free intraperitoneal air cannot be excluded on the supine exam. Gas overlies the rectum. Degenerative changes are seen in the lumbar spine. IMPRESSION: Nonobstructive bowel gas pattern. Electronically Signed   By: Zerita Boers M.D.   On: 12/06/2020 12:38   DG Abd Portable 1V  Result Date: 12/04/2020 CLINICAL DATA:  Feeding tube placement. EXAM: PORTABLE ABDOMEN - 1 VIEW COMPARISON:  December 02, 2020. FINDINGS: The bowel gas pattern is normal. Distal tip of feeding tube is seen in expected position of distal stomach. No radio-opaque calculi or other significant radiographic abnormality are seen. IMPRESSION: Distal tip of feeding tube seen in expected position of distal stomach. Electronically Signed   By: Marijo Conception M.D.   On: 12/04/2020 11:16   DG Abd Portable 1V  Result Date: 12/02/2020 CLINICAL DATA:  Diarrhea and emesis, small-bowel obstruction EXAM: PORTABLE ABDOMEN - 1 VIEW COMPARISON:  Abdominal radiographs 11/30/2020, CT abdomen/pelvis 11/29/2020 FINDINGS: There are gas distended loops of small bowel throughout the abdomen measuring up to 4.9 cm in diameter. The appearance is overall similar to the prior radiograph. There is no definite free intraperitoneal air, though  evaluation is limited by single supine radiograph. There is no gross organomegaly or abnormal soft tissue calcification. There is degenerative change of the lumbar spine. IMPRESSION: Gas distended loops of small bowel throughout the abdomen measuring up to 4.9 cm, grossly similar to the prior radiograph. Electronically Signed   By: Valetta Mole M.D.   On: 12/02/2020 13:28   DG Swallowing Func-Speech Pathology  Result Date: 12/17/2020 Table formatting from the original result was not included. Objective Swallowing Evaluation: Type of Study: MBS-Modified Barium Swallow Study  Patient Details Name: MARIEL GAUDIN MRN: [id number] Date of Birth: 06/26/1945 Today's Date: 12/17/2020 Time: SLP Start Time (ACUTE ONLY): 4128 -SLP Stop Time (ACUTE ONLY): 7867 SLP Time Calculation (min) (ACUTE ONLY): 15 min Past Medical History: Past Medical History: Diagnosis Date  Arthritis   BPH (benign prostatic hyperplasia)   CKD (chronic kidney disease), stage II   Complicated UTI (urinary tract infection) 03/2014  Depression   GERD (gastroesophageal reflux disease)   History of pneumonia   History of stroke   HOH (hard of hearing)   Hypertension   Hyponatremia   Lower GI bleed 2017  a. ? due to polyp.  NSVT (nonsustained ventricular tachycardia) (HCC)   Rheumatic fever   Childhood  Sleep apnea   Does not use CPAP  Type 2 diabetes mellitus ([place])  Past Surgical History: Past Surgical History: Procedure Laterality Date  CARPAL TUNNEL RELEASE Left 2010  CARPAL TUNNEL RELEASE Right 07/13/2017  Procedure: CARPAL TUNNEL RELEASE;  Surgeon: Carole Civil, MD;  Location: [facility];  Service: Orthopedics;  Laterality: Right;  CERVICAL SPINE SURGERY  2010  COLONOSCOPY WITH PROPOFOL N/A 11/30/2015  Procedure: COLONOSCOPY WITH PROPOFOL;  Surgeon: Daneil Dolin, MD;  Location: [facility] SU[facility];  Service: Endoscopy;  Laterality: N/A;  KNEE ARTHROSCOPY  [place] surgery - broken nose    POLYPECTOMY  11/30/2015  Procedure:  POLYPECTOMY;  Surgeon: Daneil Dolin, MD;  Location: [facility];  Service: Endoscopy;;  polyp at ascending colon, rectal polyp  TRANSURETHRAL INCISION OF PROSTATE N/A 01/20/2015  Procedure: TRANSURETHRAL INCISION OF THE PROSTATE (TUIP);  Surgeon: Irine Seal, MD;  Location: [facility];  Service: Urology;  Laterality: N/A; HPI: 75 y.o. male presented to [facility] ED with diarrhea, decreased p.o. intake and nausea. Pt admitted with acute encephalopathy and symptomatic hyponatremia. Abdominal distension 8/27. CT abd/pelvis (8/28) (+) SBO and NGT placed. 8/28 pt went into cardiac arrest, concern for possible STEMI. Transferred to [facility] for further treatment. CT head (8/28) revealed no acute intracranial abnormality. Chest xray (8/30) noted "appearance of airspace disease in the right middle lobe. Small left pleural effusion". Hosptial admission complicated by continued impulsivity and agitation with pt pulling out multiple NGT's despite restraints.  ETT: 8/28-8/30. PMHx significant for CKD, BPH, DMII, Hx of CVA, HTN, and GERD. SLE (08/23/2017) revealed cognitive communication deficits post CVA.  Subjective: Pt seen in radiology for instrumental assessment of swallow function and safety. No family present. Assessment / Plan / Recommendation CH[facility] IP CLINICAL IMPRESSIONS 12/17/2020 Clinical Impression Pt presents with ongoing dysphagia with several factors that could be persistently impacting effective swallowing. Pt has a prior ACDF with hardware present from C3-C6. His brother denies any knowledge of baseline impairment, but suspect pt may have had a mild change in swallowing since that surgery that he compensated for and can no longer discuss. He is noted to have decreased epiglottic deflection, with epiglottic movement restricted by posterior pharyngeal wall, keeping vestibule slightly open at the height of laryngeal elevation. Pt has instances of bolus penetratating the vestibule before hyoid excursion with thin and nectar thick  liquids squeezing between the posterior process of the glottis during hyoid excursion. Pts sensation is absent and voice is slightly hoarse. Question the integrity of glottic competence given this finding. Pt was unable to sustain a chin tuck or achieve any other strategies gievn mentation and decreased neck ROM. Recommend pt continue a finely chopped diet given rapid intake of solids with partial mastication and spillage of solids to lower pharynx during ongoing masticaiton. Pt should also continue honey thick liquids. Discussed findings with brother. Recommend a repeat MBS a few weeks after NG tube removal for potential upgrade as presence of NG could be another complicating factor. If there is no improvement in airway protection with liquids after another 14 days approximately, would recommend referral to ENT to examine larynx directly. SLP Visit Diagnosis Dysphagia, oropharyngeal phase (R13.12) Attention and concentration deficit following -- Frontal lobe and executive function deficit following -- Impact on safety and function Moderate aspiration risk   CHL IP TREATMENT RECOMMENDATION 12/17/2020 Treatment Recommendations Therapy as outlined in treatment plan below   Prognosis 12/17/2020 Prognosis for Safe Diet Advancement Good Barriers to Reach Goals -- Barriers/Prognosis Comment -- [facility] IP DIET RECOMMENDATION 12/17/2020 SLP Diet Recommendations Dysphagia 2 (Fine chop) solids;Honey thick liquids Liquid Administration via Cup;Straw;Spoon Medication Administration Crushed with puree Compensations Minimize environmental distractions;Slow rate;Small sips/bites Postural Changes Remain semi-upright after after feeds/meals (  Comment)   CHL IP OTHER RECOMMENDATIONS 12/17/2020 Recommended Consults -- Oral Care Recommendations Oral care BID Other Recommendations --   CH[facility] IP FOLLOW UP RECOMMENDATIONS 12/17/2020 Follow up Recommendations Skilled Nursing facility   [facility] IP FREQUENCY AND DURATION 12/17/2020 Speech Therapy Frequency  (ACUTE ONLY) min 2x/week Treatment Duration 2 weeks      CHL IP ORAL PHASE 12/17/2020 Oral Phase Impaired Oral - Pudding Teaspoon -- Oral - Pudding Cup -- Oral - Honey Teaspoon NT Oral - Honey Cup WFL Oral - Nectar Teaspoon WFL Oral - Nectar Cup WFL Oral - Nectar Straw WFL Oral - Thin Teaspoon -- Oral - Thin Cup WFL Oral - Thin Straw WFL Oral - Puree WFL Oral - Mech Soft -- Oral - Regular Premature spillage;Decreased bolus cohesion;Delayed oral transit;Holding of bolus Oral - Multi-Consistency -- Oral - Pill -- Oral Phase - Comment --  CHL IP PHARYNGEAL PHASE 12/17/2020 Pharyngeal Phase Impaired Pharyngeal- Pudding Teaspoon -- Pharyngeal -- Pharyngeal- Pudding Cup -- Pharyngeal -- Pharyngeal- Honey Teaspoon NT Pharyngeal -- Pharyngeal- Honey Cup WFL Pharyngeal -- Pharyngeal- Nectar Teaspoon NT Pharyngeal -- Pharyngeal- Nectar Cup Penetration/Aspiration before swallow;Penetration/Aspiration during swallow;Reduced epiglottic inversion;Reduced airway/laryngeal closure;Trace aspiration Pharyngeal Material enters airway, CONTACTS cords and not ejected out;Material enters airway, passes BELOW cords without attempt by patient to eject out (silent aspiration);Material enters airway, passes BELOW cords then ejected out Pharyngeal- Nectar Straw Penetration/Aspiration before swallow;Penetration/Aspiration during swallow;Reduced epiglottic inversion;Reduced airway/laryngeal closure;Trace aspiration Pharyngeal Material enters airway, CONTACTS cords and not ejected out;Material enters airway, passes BELOW cords without attempt by patient to eject out (silent aspiration) Pharyngeal- Thin Teaspoon -- Pharyngeal -- Pharyngeal- Thin Cup Reduced epiglottic inversion;Penetration/Aspiration before swallow;Penetration/Aspiration during swallow;Trace aspiration;Moderate aspiration;Reduced airway/laryngeal closure Pharyngeal Material enters airway, passes BELOW cords without attempt by patient to eject out (silent aspiration);Material enters  airway, CONTACTS cords and then ejected out;Material enters airway, CONTACTS cords and not ejected out Pharyngeal- Thin Straw Reduced epiglottic inversion;Penetration/Aspiration before swallow;Penetration/Aspiration during swallow;Trace aspiration;Moderate aspiration;Reduced airway/laryngeal closure Pharyngeal Material enters airway, passes BELOW cords without attempt by patient to eject out (silent aspiration);Material enters airway, CONTACTS cords and then ejected out;Material enters airway, CONTACTS cords and not ejected out Pharyngeal- Puree WFL Pharyngeal -- Pharyngeal- Mechanical Soft -- Pharyngeal -- Pharyngeal- Regular Pharyngeal residue - valleculae;Pharyngeal residue - pyriform Pharyngeal -- Pharyngeal- Multi-consistency -- Pharyngeal -- Pharyngeal- Pill -- Pharyngeal -- Pharyngeal Comment --  CHL IP CERVICAL ESOPHAGEAL PHASE 12/09/2020 Cervical Esophageal Phase WFL Pudding Teaspoon -- Pudding Cup -- Honey Teaspoon -- Honey Cup -- Nectar Teaspoon -- Nectar Cup -- Nectar Straw -- Thin Teaspoon -- Thin Cup -- Thin Straw -- Puree -- Mechanical Soft -- Regular -- Multi-consistency -- Pill -- Cervical Esophageal Comment -- Lynann Beaver 12/17/2020, 11:52 AM              DG Swallowing Func-Speech Pathology  Result Date: 12/09/2020 Table formatting from the original result was not included. Objective Swallowing Evaluation: Type of Study: MBS-Modified Barium Swallow Study  Patient Details Name: BARY LIMBACH MRN: [id number] Date of Birth: 08/04/1945 Today's Date: 12/09/2020 Time: SLP Start Time (ACUTE ONLY): 1300 -SLP Stop Time (ACUTE ONLY): 1325 SLP Time Calculation (min) (ACUTE ONLY): 25 min Past Medical History: Past Medical History: Diagnosis Date  Arthritis   BPH (benign prostatic hyperplasia)   CKD (chronic kidney disease), stage II   Complicated UTI (urinary tract infection) 03/2014  Depression   GERD (gastroesophageal reflux disease)   History of pneumonia   History of stroke   HOH (hard of  hearing)   Hypertension   Hyponatremia   Lower GI bleed 2017  a. ? due to polyp.  NSVT (nonsustained ventricular tachycardia) (HCC)   Rheumatic fever   Childhood  Sleep apnea   Does not use CPAP  Type 2 diabetes mellitus ([place])  Past Surgical History: Past Surgical History: Procedure Laterality Date  CARPAL TUNNEL RELEASE Left 2010  CARPAL TUNNEL RELEASE Right 07/13/2017  Procedure: CARPAL TUNNEL RELEASE;  Surgeon: Carole Civil, MD;  Location: [facility];  Service: Orthopedics;  Laterality: Right;  CERVICAL SPINE SURGERY  2010  COLONOSCOPY WITH PROPOFOL N/A 11/30/2015  Procedure: COLONOSCOPY WITH PROPOFOL;  Surgeon: Daneil Dolin, MD;  Location: [facility];  Service: Endoscopy;  Laterality: N/A;  KNEE ARTHROSCOPY  [place] surgery - broken nose    POLYPECTOMY  11/30/2015  Procedure: POLYPECTOMY;  Surgeon: Daneil Dolin, MD;  Location: [facility];  Service: Endoscopy;;  polyp at ascending colon, rectal polyp  TRANSURETHRAL INCISION OF PROSTATE N/A 01/20/2015  Procedure: TRANSURETHRAL INCISION OF THE PROSTATE (TUIP);  Surgeon: Irine Seal, MD;  Location: [facility];  Service: Urology;  Laterality: N/A; HPI: 75 y.o. male presented to [facility] ED with diarrhea, decreased p.o. intake and nausea. Pt admitted with acute encephalopathy and symptomatic hyponatremia. Abdominal distension 8/27. CT abd/pelvis (8/28) (+) SBO and NGT placed. 8/28 pt went into cardiac arrest, concern for possible STEMI. Transferred to [facility] for further treatment. CT head (8/28) revealed no acute intracranial abnormality. Chest xray (8/30) noted "appearance of airspace disease in the right middle lobe. Small left pleural effusion". Hosptial admission complicated by continued impulsivity and agitation with pt pulling out multiple NGT's despite restraints.  ETT: 8/28-8/30. PMHx significant for CKD, BPH, DMII, Hx of CVA, HTN, and GERD. SLE (08/23/2017) revealed cognitive communication deficits post CVA.  Subjective: Pt seen in  radiology for instrumental assessment of swallow function and safety. No family present. Assessment / Plan / Recommendation CHL IP CLINICAL IMPRESSIONS 12/09/2020 Clinical Impression Pt presents with a moderate-severe oral and pharyngeal dysphagia, characterized as follows: Orally, pt demonstrates poor bolus formation and control, with premature spillage over the tongue base across consistencies. Pt was noted to piecemeal several boluses, swallowing small amounts of the bolus at one time. Oral residue was also noted, which then spilled posteriorly into the vallecular sinus. Pharyngeal swallow is characterized by delayed swallow reflex, with trigger occurring at the level of the pyriform sinuses on nectar and honey thick liquids (cup sip), and at the vallecular sinus on honey (tsp) and puree. Epiglottic inversion was reduced, in large part by the presence of the Cortrak feeding tube. This resulted in vallecular residue across consistencies. This increases aspiration risk, as residue thins with saliva, or is added to by subsequent boluses and can potentially spill over the vallecular sinus and into the open airway. SILENT ASPIRATION was seen on nectar thick liquids via teaspoon and cup sip. Flash penetration was noted during the swallow of honey thick liquids and puree. Pt was noted to be impulsive with cup sips, and has difficulty following commands due to confusion and hearing loss. Pt is at significantly high risk of aspiration of any consistency given. Puree and honey thick liquids may be presented in small bites/sips with 1:1 close supervision. Meds should be crushed in puree or given via Cortrak.  SLP Visit Diagnosis Dysphagia, oropharyngeal phase (R13.12)     Impact on safety and function Severe aspiration risk   CHL IP TREATMENT RECOMMENDATION 12/09/2020 Treatment  Recommendations Therapy as outlined in treatment plan below   Prognosis 12/09/2020 Prognosis for Safe Diet Advancement Fair Barriers to Reach Goals  Cognitive deficits   CHL IP DIET RECOMMENDATION 12/09/2020 SLP Diet Recommendations Dysphagia 1 (Puree) solids;Honey thick liquids Liquid Administration via Cup;No straw;Spoon Medication Administration Crushed with puree Compensations Minimize environmental distractions;Slow rate;Small sips/bites Postural Changes Seated upright at 90 degrees;Remain semi-upright after after feeds/meals (Comment)   CHL IP OTHER RECOMMENDATIONS 12/09/2020   Oral Care Recommendations Oral care QID Other Recommendations Order thickener from pharmacy   CHL IP FOLLOW UP RECOMMENDATIONS 12/09/2020 Follow up Recommendations TBD   CHL IP FREQUENCY AND DURATION 12/09/2020 Speech Therapy Frequency (ACUTE ONLY) min 2x/week Treatment Duration 2 weeks      CHL IP ORAL PHASE 12/09/2020 Oral Phase Impaired  Oral - Honey Teaspoon Premature spillage;Other (Comment) Oral - Honey Cup Premature spillage;Other (Comment);Piecemeal swallowing Oral - Nectar Teaspoon Other (Comment);Premature spillage Oral - Nectar Cup Premature spillage;Other (Comment);Piecemeal swallowing Oral - Puree Piecemeal swallowing;Premature spillage   CHL IP PHARYNGEAL PHASE 12/09/2020 Pharyngeal Phase Impaired Pharyngeal- Honey Teaspoon Delayed swallow initiation-vallecula;Pharyngeal residue - valleculae;Penetration/Aspiration during swallow Pharyngeal Material enters airway, remains ABOVE vocal cords then ejected out Pharyngeal- Honey Cup Delayed swallow initiation-pyriform sinuses;Pharyngeal residue - valleculae;Penetration/Aspiration during swallow Pharyngeal Material enters airway, remains ABOVE vocal cords then ejected out Pharyngeal- Nectar Teaspoon Delayed swallow initiation-pyriform sinuses;Pharyngeal residue - valleculae;Penetration/Aspiration during swallow Pharyngeal Material enters airway, passes BELOW cords without attempt by patient to eject out (silent aspiration) Pharyngeal- Nectar Cup Delayed swallow initiation-pyriform sinuses;Pharyngeal residue -  valleculae;Penetration/Aspiration during swallow Pharyngeal Material enters airway, passes BELOW cords without attempt by patient to eject out (silent aspiration) Pharyngeal- Puree Delayed swallow initiation-vallecula;Pharyngeal residue - valleculae;Penetration/Aspiration during swallow Pharyngeal Material enters airway, remains ABOVE vocal cords and not ejected out  CHL IP CERVICAL ESOPHAGEAL PHASE 12/09/2020 Cervical Esophageal Phase [facility] Celia B. [name], [facility], CCC-SLP Speech Language Pathologist Office: [phone] Shonna Chock 12/09/2020, 2:00 PM              ECHOCARDIOGRAM COMPLETE  Result Date: 11/28/2020    ECHOCARDIOGRAM REPORT   Patient Name:   LAYDEN CATERINO Date of Exam: 11/28/2020 Medical Rec #:  [id number]       Height:       71.0 in Accession #:    [id number]      Weight:       195.1 lb Date of Birth:  07-22-1945       BSA:          2.086 m Patient Age:    12 years        BP:           167/82 mmHg Patient Gender: M               HR:           65 bpm. Exam Location:  [facility] Procedure: 2D Echo, Cardiac Doppler and Color Doppler Indications:    R94.31 Abnormal EKG  History:        Patient has prior history of Echocardiogram examinations, most                 recent 08/23/2017. Risk Factors:Diabetes and Hypertension.  Sonographer:    Bernadene Person RDCS Referring Phys: [place]  1. Left ventricular ejection fraction, by estimation, is 55 to 60%. The left ventricle has normal function. The left ventricle has no regional wall motion abnormalities. There is moderate concentric left ventricular hypertrophy. Left ventricular diastolic parameters  are consistent with Grade I diastolic dysfunction (impaired relaxation).  2. Right ventricular systolic function is normal. The right ventricular size is normal. Tricuspid regurgitation signal is inadequate for assessing PA pressure.  3. The mitral valve is normal in structure. Trivial mitral valve regurgitation. No evidence of mitral  stenosis.  4. The aortic valve is tricuspid. Aortic valve regurgitation is mild.  5. Aortic dilatation noted. There is mild dilatation of the aortic root, measuring 43 mm. Comparison(s): A prior study was performed on 08/23/2017. Slight increase in aortic root. FINDINGS  Left Ventricle: Left ventricular ejection fraction, by estimation, is 55 to 60%. The left ventricle has normal function. The left ventricle has no regional wall motion abnormalities. The left ventricular internal cavity size was normal in size. There is  moderate concentric left ventricular hypertrophy. Left ventricular diastolic parameters are consistent with Grade I diastolic dysfunction (impaired relaxation). Right Ventricle: The right ventricular size is normal. No increase in right ventricular wall thickness. Right ventricular systolic function is normal. Tricuspid regurgitation signal is inadequate for assessing PA pressure. Left Atrium: Left atrial size was normal in size. Right Atrium: Right atrial size was normal in size. Pericardium: There is no evidence of pericardial effusion. Mitral Valve: The mitral valve is normal in structure. Trivial mitral valve regurgitation. No evidence of mitral valve stenosis. Tricuspid Valve: The tricuspid valve is normal in structure. Tricuspid valve regurgitation is not demonstrated. Aortic Valve: The aortic valve is tricuspid. Aortic valve regurgitation is mild. Aortic regurgitation PHT measures 1048 msec. Pulmonic Valve: The pulmonic valve was not well visualized. Pulmonic valve regurgitation is mild. No evidence of pulmonic stenosis. Aorta: Aortic dilatation noted. There is mild dilatation of the aortic root, measuring 43 mm. IAS/Shunts: The atrial septum is grossly normal.  LEFT VENTRICLE PLAX 2D LVIDd:         4.97 cm  Diastology LVIDs:         3.34 cm  LV e' medial:    5.40 cm/s LV PW:         1.38 cm  LV E/e' medial:  5.9 LV IVS:        1.39 cm  LV e' lateral:   6.23 cm/s LVOT diam:     2.20 cm  LV  E/e' lateral: 5.1 LV SV:         100 LV SV Index:   48 LVOT Area:     3.80 cm  RIGHT VENTRICLE RV S prime:     14.30 cm/s TAPSE (M-mode): 1.9 cm LEFT ATRIUM             Index       RIGHT ATRIUM          Index LA diam:        2.50 cm 1.20 cm/m  RA Area:     8.74 cm LA Vol (A2C):   33.2 ml 15.91 ml/m RA Volume:   11.90 ml 5.70 ml/m LA Vol (A4C):   32.7 ml 15.67 ml/m LA Biplane Vol: 33.5 ml 16.06 ml/m  AORTIC VALVE LVOT Vmax:   108.00 cm/s LVOT Vmean:  68.500 cm/s LVOT VTI:    0.262 m AI PHT:      1048 msec  AORTA Ao Root diam: 4.30 cm Ao Asc diam:  3.80 cm MITRAL VALVE MV Area (PHT): 1.87 cm    SHUNTS MV Decel Time: 405 msec    Systemic VTI:  0.26 m MV E velocity: 31.70 cm/s  Systemic Diam: 2.20 cm MV A velocity: 72.00  cm/s MV E/A ratio:  0.44 Rudean Haskell MD Electronically signed by Rudean Haskell MD Signature Date/Time: 11/28/2020/3:42:26 PM    Final    ECHOCARDIOGRAM LIMITED  Result Date: 11/30/2020    ECHOCARDIOGRAM LIMITED REPORT   Patient Name:   COLEMAN KALAS Date of Exam: 11/30/2020 Medical Rec #:  [id number]       Height:       71.0 in Accession #:    [id number]      Weight:       198.2 lb Date of Birth:  1945/07/18       BSA:          2.100 m Patient Age:    89 years        BP:           106/54 mmHg Patient Gender: M               HR:           66 bpm. Exam Location:  [facility] Procedure: Limited Echo, Cardiac Doppler and Limited Color Doppler Indications:    eval LVEF  History:        Patient has prior history of Echocardiogram examinations. Risk                 Factors:Diabetes and Hypertension. Ventricular Tachycardia                 I47.2.  Sonographer:    Alvino Chapel RCS Referring Phys: [id number] [facility]  Sonographer Comments: Patient on mechanical ventilator at time of echo. IMPRESSIONS  1. Left ventricular ejection fraction, by estimation, is 65 to 70%. The left ventricle has normal function. The left ventricle has no regional wall motion abnormalities. There is mild  left ventricular hypertrophy. Left ventricular diastolic parameters are indeterminate.  2. Right ventricular systolic function is normal. The right ventricular size is normal. Tricuspid regurgitation signal is inadequate for assessing PA pressure.  3. The mitral valve is grossly normal.  4. Aortic valve regurgitation is mild.  5. Aortic dilatation noted. There is mild dilatation of the aortic root, measuring 42 mm. Comparison(s): Prior images reviewed side by side. Limited study. LVEF remains normal range. FINDINGS  Left Ventricle: Left ventricular ejection fraction, by estimation, is 65 to 70%. The left ventricle has normal function. The left ventricle has no regional wall motion abnormalities. The left ventricular internal cavity size was normal in size. There is  mild left ventricular hypertrophy. Left ventricular diastolic parameters are indeterminate. Right Ventricle: The right ventricular size is normal. No increase in right ventricular wall thickness. Right ventricular systolic function is normal. Tricuspid regurgitation signal is inadequate for assessing PA pressure. Pericardium: Presence of pericardial fat pad. Mitral Valve: The mitral valve is grossly normal. There is mild calcification of the mitral valve leaflet(s). Aortic Valve: Aortic valve regurgitation is mild. Aorta: Aortic dilatation noted. There is mild dilatation of the aortic root, measuring 42 mm. Venous: IVC assessment for right atrial pressure unable to be performed due to mechanical ventilation. IAS/Shunts: No atrial level shunt detected by color flow Doppler. LEFT VENTRICLE PLAX 2D LVIDd:         4.40 cm LVIDs:         2.70 cm LV PW:         1.30 cm LV IVS:        1.30 cm LVOT diam:     2.30 cm LVOT Area:     4.15 cm  LEFT ATRIUM  Index LA diam:    2.00 cm 0.95 cm/m   AORTA Ao Root diam: 4.20 cm MITRAL VALVE MV Area (PHT): 2.33 cm    SHUNTS MV Decel Time: 326 msec    Systemic Diam: 2.30 cm MV E velocity: 62.10 cm/s MV A velocity:  82.70 cm/s MV E/A ratio:  0.75 Rozann Lesches MD Electronically signed by Rozann Lesches MD Signature Date/Time: 11/30/2020/2:13:35 PM    Final    [place] EKG SITE RITE  Result Date: 12/03/2020 If Site Rite image not attached, placement could not be confirmed due to current cardiac rhythm.    Discharge Exam: Vitals:   12/27/20 2132 12/28/20 0608  BP: (!) 145/63 (!) 135/59  Pulse: 70 69  Resp: 19 15  Temp: 98.1 F (36.7 C) 98.1 F (36.7 C)  SpO2: 99% 97%   Vitals:   12/27/20 0508 12/27/20 1359 12/27/20 2132 12/28/20 0608  BP: 114/66 122/63 (!) 145/63 (!) 135/59  Pulse: 66 62 70 69  Resp: 18 18 19 15   Temp: 99.6 F (37.6 C) 98.1 F (36.7 C) 98.1 F (36.7 C) 98.1 F (36.7 C)  TempSrc: Rectal Oral Oral Oral  SpO2: 98% 98% 99% 97%  Weight:      Height:        General: Pt is alert, awake, not in acute distress Cardiovascular: RRR, S1/S2 +, no rubs, no gallops Respiratory: CTA bilaterally, no wheezing, no rhonchi Abdominal: Soft, NT, ND, bowel sounds + Extremities: no edema, no cyanosis Foley with clear, yellow urine output    The results of significant diagnostics from this hospitalization (including imaging, microbiology, ancillary and laboratory) are listed below for reference.     Microbiology: Recent Results (from the past 240 hour(s))  Resp Panel by RT-PCR (Flu A&B, Covid) Nasopharyngeal Swab     Status: None   Collection Time: 12/23/20 11:37 AM   Specimen: Nasopharyngeal Swab; Nasopharyngeal(NP) swabs in vial transport medium  Result Value Ref Range Status   SARS Coronavirus 2 by RT PCR NEGATIVE NEGATIVE Final    Comment: (NOTE) SARS-CoV-2 target nucleic acids are NOT DETECTED.  The SARS-CoV-2 RNA is generally detectable in upper respiratory specimens during the acute phase of infection. The lowest concentration of SARS-CoV-2 viral copies this assay can detect is 138 copies/mL. A negative result does not preclude SARS-Cov-2 infection and should not be used as  the sole basis for treatment or other patient management decisions. A negative result may occur with  improper specimen collection/handling, submission of specimen other than nasopharyngeal swab, presence of viral mutation(s) within the areas targeted by this assay, and inadequate number of viral copies(<138 copies/mL). A negative result must be combined with clinical observations, patient history, and epidemiological information. The expected result is Negative.  Fact Sheet for Patients:  EntrepreneurPulse.com.au  Fact Sheet for Healthcare Providers:  IncredibleEmployment.be  This test is no t yet approved or cleared by the [place] FDA and  has been authorized for detection and/or diagnosis of SARS-CoV-2 by FDA under an Emergency Use Authorization (EUA). This EUA will remain  in effect (meaning this test can be used) for the duration of the COVID-19 declaration under Section 564(b)(1) of the Act, 21 U.S.C.section 360bbb-3(b)(1), unless the authorization is terminated  or revoked sooner.       Influenza A by PCR NEGATIVE NEGATIVE Final   Influenza B by PCR NEGATIVE NEGATIVE Final    Comment: (NOTE) The Xpert Xpress SARS-CoV-2/FLU/RSV plus assay is intended as an aid in the diagnosis of influenza from Nasopharyngeal  swab specimens and should not be used as a sole basis for treatment. Nasal washings and aspirates are unacceptable for Xpert Xpress SARS-CoV-2/FLU/RSV testing.  Fact Sheet for Patients: EntrepreneurPulse.com.au  Fact Sheet for Healthcare Providers: IncredibleEmployment.be  This test is not yet approved or cleared by the [place] FDA and has been authorized for detection and/or diagnosis of SARS-CoV-2 by FDA under an Emergency Use Authorization (EUA). This EUA will remain in effect (meaning this test can be used) for the duration of the COVID-19 declaration under Section 564(b)(1) of  the Act, 21 U.S.C. section 360bbb-3(b)(1), unless the authorization is terminated or revoked.  Performed at [facility], [place] [address] [place], [place] [address]   Urine Culture     Status: Abnormal   Collection Time: 12/25/20  2:31 PM   Specimen: Urine, Catheterized  Result Value Ref Range Status   Specimen Description   Final    URINE, CATHETERIZED Performed at [facility], [address], [place], [place] [address]    Special Requests   Final    NONE Performed at [facility], [address] [place], [place] [address]    Culture (A)  Final    80,000 COLONIES/mL STREPTOCOCCUS PYOGENES Beta hemolytic streptococci are predictably susceptible to penicillin and other beta lactams. Susceptibility testing not routinely performed. Performed at [facility], [place] [address] [place], [place] [address]    Report Status 12/27/2020 FINAL  Final  Culture, blood (Routine X 2) w Reflex to ID Panel     Status: Abnormal   Collection Time: 12/25/20  3:44 PM   Specimen: BLOOD RIGHT FOREARM  Result Value Ref Range Status   Specimen Description   Final    BLOOD RIGHT FOREARM Performed at [facility], [address] [place], [place] [address]    Special Requests   Final    BOTTLES DRAWN AEROBIC AND ANAEROBIC Blood Culture adequate volume Performed at [facility], [address] [place], [place] [address]    Culture  Setup Time   Final    GRAM POSITIVE COCCI IN BOTH AEROBIC AND ANAEROBIC BOTTLES Gram Stain Report Called to,Read Back By and Verified With: WATSON @ 1019 ON [id number] BY HENDERSON L CRITICAL RESULT CALLED TO, READ BACK BY AND VERIFIED WITH: S HURTH PHARMD @1400  12/26/21 EB Performed at [facility], [place] [address], [place], [place] [address]    Culture GROUP A STREP (S.PYOGENES) ISOLATED (A)  Final   Report Status 12/28/2020 FINAL  Final   Organism ID, Bacteria GROUP A STREP (S.PYOGENES) ISOLATED  Final      Susceptibility   Group a strep (s.pyogenes)  isolated - MIC*    PENICILLIN <=0.06 SENSITIVE Sensitive     CEFTRIAXONE <=0.12 SENSITIVE Sensitive     ERYTHROMYCIN <=0.12 SENSITIVE Sensitive     LEVOFLOXACIN 0.5 SENSITIVE Sensitive     VANCOMYCIN 0.5 SENSITIVE Sensitive     * GROUP A STREP (S.PYOGENES) ISOLATED  Blood Culture ID Panel (Reflexed)     Status: Abnormal   Collection Time: 12/25/20  3:44 PM  Result Value Ref Range Status   Enterococcus faecalis NOT DETECTED NOT DETECTED Final   Enterococcus Faecium NOT DETECTED NOT DETECTED Final   Listeria monocytogenes NOT DETECTED NOT DETECTED Final   Staphylococcus species NOT DETECTED NOT DETECTED Final   Staphylococcus aureus (BCID) NOT DETECTED NOT DETECTED Final   Staphylococcus epidermidis NOT DETECTED NOT DETECTED Final   Staphylococcus lugdunensis NOT DETECTED NOT DETECTED Final   Streptococcus species  DETECTED (A) NOT DETECTED Final    Comment: CRITICAL RESULT CALLED TO, READ BACK BY AND VERIFIED WITH: S HURTH PHARMD @1400  12/26/21 EB    Streptococcus agalactiae NOT DETECTED NOT DETECTED Final   Streptococcus pneumoniae NOT DETECTED NOT DETECTED Final   Streptococcus pyogenes DETECTED (A) NOT DETECTED Final    Comment: CRITICAL RESULT CALLED TO, READ BACK BY AND VERIFIED WITH: S HURTH PHARMD @1400  12/26/21 EB    A.calcoaceticus-baumannii NOT DETECTED NOT DETECTED Final   Bacteroides fragilis NOT DETECTED NOT DETECTED Final   Enterobacterales NOT DETECTED NOT DETECTED Final   Enterobacter cloacae complex NOT DETECTED NOT DETECTED Final   Escherichia coli NOT DETECTED NOT DETECTED Final   Klebsiella aerogenes NOT DETECTED NOT DETECTED Final   Klebsiella oxytoca NOT DETECTED NOT DETECTED Final   Klebsiella pneumoniae NOT DETECTED NOT DETECTED Final   Proteus species NOT DETECTED NOT DETECTED Final   Salmonella species NOT DETECTED NOT DETECTED Final   Serratia marcescens NOT DETECTED NOT DETECTED Final   Haemophilus influenzae NOT DETECTED NOT DETECTED Final   Neisseria  meningitidis NOT DETECTED NOT DETECTED Final   Pseudomonas aeruginosa NOT DETECTED NOT DETECTED Final   Stenotrophomonas maltophilia NOT DETECTED NOT DETECTED Final   Candida albicans NOT DETECTED NOT DETECTED Final   Candida auris NOT DETECTED NOT DETECTED Final   Candida glabrata NOT DETECTED NOT DETECTED Final   Candida krusei NOT DETECTED NOT DETECTED Final   Candida parapsilosis NOT DETECTED NOT DETECTED Final   Candida tropicalis NOT DETECTED NOT DETECTED Final   Cryptococcus neoformans/gattii NOT DETECTED NOT DETECTED Final    Comment: Performed at [facility], [place]. [address] [place], [place] [address]  Resp Panel by RT-PCR (Flu A&B, Covid) Nasopharyngeal Swab     Status: None   Collection Time: 12/25/20  4:15 PM   Specimen: Nasopharyngeal Swab; Nasopharyngeal(NP) swabs in vial transport medium  Result Value Ref Range Status   SARS Coronavirus 2 by RT PCR NEGATIVE NEGATIVE Final    Comment: (NOTE) SARS-CoV-2 target nucleic acids are NOT DETECTED.  The SARS-CoV-2 RNA is generally detectable in upper respiratory specimens during the acute phase of infection. The lowest concentration of SARS-CoV-2 viral copies this assay can detect is 138 copies/mL. A negative result does not preclude SARS-Cov-2 infection and should not be used as the sole basis for treatment or other patient management decisions. A negative result may occur with  improper specimen collection/handling, submission of specimen other than nasopharyngeal swab, presence of viral mutation(s) within the areas targeted by this assay, and inadequate number of viral copies(<138 copies/mL). A negative result must be combined with clinical observations, patient history, and epidemiological information. The expected result is Negative.  Fact Sheet for Patients:  EntrepreneurPulse.com.au  Fact Sheet for Healthcare Providers:  IncredibleEmployment.be  This test is no t yet  approved or cleared by the [place] FDA and  has been authorized for detection and/or diagnosis of SARS-CoV-2 by FDA under an Emergency Use Authorization (EUA). This EUA will remain  in effect (meaning this test can be used) for the duration of the COVID-19 declaration under Section 564(b)(1) of the Act, 21 U.S.C.section 360bbb-3(b)(1), unless the authorization is terminated  or revoked sooner.       Influenza A by PCR NEGATIVE NEGATIVE Final   Influenza B by PCR NEGATIVE NEGATIVE Final    Comment: (NOTE) The Xpert Xpress SARS-CoV-2/FLU/RSV plus assay is intended as an aid in the diagnosis of influenza from Nasopharyngeal swab specimens and should not  be used as a sole basis for treatment. Nasal washings and aspirates are unacceptable for Xpert Xpress SARS-CoV-2/FLU/RSV testing.  Fact Sheet for Patients: EntrepreneurPulse.com.au  Fact Sheet for Healthcare Providers: IncredibleEmployment.be  This test is not yet approved or cleared by the [place] FDA and has been authorized for detection and/or diagnosis of SARS-CoV-2 by FDA under an Emergency Use Authorization (EUA). This EUA will remain in effect (meaning this test can be used) for the duration of the COVID-19 declaration under Section 564(b)(1) of the Act, 21 U.S.C. section 360bbb-3(b)(1), unless the authorization is terminated or revoked.  Performed at [facility], [address], [place], [place] [address]   Culture, blood (Routine X 2) w Reflex to ID Panel     Status: None (Preliminary result)   Collection Time: 12/25/20  4:24 PM   Specimen: BLOOD LEFT HAND  Result Value Ref Range Status   Specimen Description BLOOD LEFT HAND  Final   Special Requests   Final    BOTTLES DRAWN AEROBIC AND ANAEROBIC Blood Culture results may not be optimal due to an excessive volume of blood received in culture bottles   Culture   Final    NO GROWTH 2 DAYS Performed at [facility],  [address], [place], [place] [address]    Report Status PENDING  Incomplete     Labs: BNP (last 3 results) No results for input(s): BNP in the last 8760 hours. Basic Metabolic Panel: Recent Labs  Lab 12/25/20 1624 12/26/20 0606 12/27/20 0530  NA 132* 133* 134*  K 3.8 3.4* 3.6  CL 102 102 104  CO2 20* 22 23  GLUCOSE 229* 176* 147*  BUN 15 13 11   CREATININE 1.13 1.02 1.03  CALCIUM 8.1* 7.9* 8.0*  MG  --   --  1.8   Liver Function Tests: Recent Labs  Lab 12/25/20 1624  AST 22  ALT 18  ALKPHOS 89  BILITOT 0.7  PROT 6.8  ALBUMIN 3.0*   No results for input(s): LIPASE, AMYLASE in the last 168 hours. No results for input(s): AMMONIA in the last 168 hours. CBC: Recent Labs  Lab 12/25/20 1624 12/26/20 0606 12/27/20 0530  WBC 20.6* 16.6* 8.3  NEUTROABS 17.2*  --   --   HGB 12.3* 11.0* 11.4*  HCT 37.3* 33.9* 35.2*  MCV 91.0 90.4 91.2  PLT 302 288 272   Cardiac Enzymes: No results for input(s): CKTOTAL, CKMB, CKMBINDEX, TROPONINI in the last 168 hours. BNP: Invalid input(s): POCBNP CBG: Recent Labs  Lab 12/27/20 0812 12/27/20 1157 12/27/20 1722 12/27/20 2147 12/28/20 0800  GLUCAP 156* 175* 235* 156* 196*   D-Dimer No results for input(s): DDIMER in the last 72 hours. Hgb A1c No results for input(s): HGBA1C in the last 72 hours. Lipid Profile No results for input(s): CHOL, HDL, LDLCALC, TRIG, CHOLHDL, LDLDIRECT in the last 72 hours. Thyroid function studies No results for input(s): TSH, T4TOTAL, T3FREE, THYROIDAB in the last 72 hours.  Invalid input(s): FREET3 Anemia work up No results for input(s): VITAMINB12, FOLATE, FERRITIN, TIBC, IRON, RETICCTPCT in the last 72 hours. Urinalysis    Component Value Date/Time   COLORURINE BROWN (A) 12/25/2020 1238   APPEARANCEUR HAZY (A) 12/25/2020 1238   LABSPEC 1.015 12/25/2020 1238   PHURINE 5.0 12/25/2020 1238   GLUCOSEU >=500 (A) 12/25/2020 1238   HGBUR LARGE (A) 12/25/2020 1238   BILIRUBINUR NEGATIVE  12/25/2020 [place] 12/25/2020 1238   PROTEINUR 100 (A) 12/25/2020 1238   UROBILINOGEN 0.2 03/24/2014  [place] 12/25/2020 1238   LEUKOCYTESUR MODERATE (A) 12/25/2020 1238   Sepsis Labs Invalid input(s): PROCALCITONIN,  WBC,  LACTICIDVEN Microbiology Recent Results (from the past 240 hour(s))  Resp Panel by RT-PCR (Flu A&B, Covid) Nasopharyngeal Swab     Status: None   Collection Time: 12/23/20 11:37 AM   Specimen: Nasopharyngeal Swab; Nasopharyngeal(NP) swabs in vial transport medium  Result Value Ref Range Status   SARS Coronavirus 2 by RT PCR NEGATIVE NEGATIVE Final    Comment: (NOTE) SARS-CoV-2 target nucleic acids are NOT DETECTED.  The SARS-CoV-2 RNA is generally detectable in upper respiratory specimens during the acute phase of infection. The lowest concentration of SARS-CoV-2 viral copies this assay can detect is 138 copies/mL. A negative result does not preclude SARS-Cov-2 infection and should not be used as the sole basis for treatment or other patient management decisions. A negative result may occur with  improper specimen collection/handling, submission of specimen other than nasopharyngeal swab, presence of viral mutation(s) within the areas targeted by this assay, and inadequate number of viral copies(<138 copies/mL). A negative result must be combined with clinical observations, patient history, and epidemiological information. The expected result is Negative.  Fact Sheet for Patients:  EntrepreneurPulse.com.au  Fact Sheet for Healthcare Providers:  IncredibleEmployment.be  This test is no t yet approved or cleared by the [place] FDA and  has been authorized for detection and/or diagnosis of SARS-CoV-2 by FDA under an Emergency Use Authorization (EUA). This EUA will remain  in effect (meaning this test can be used) for the duration of the COVID-19 declaration under Section 564(b)(1) of  the Act, 21 U.S.C.section 360bbb-3(b)(1), unless the authorization is terminated  or revoked sooner.       Influenza A by PCR NEGATIVE NEGATIVE Final   Influenza B by PCR NEGATIVE NEGATIVE Final    Comment: (NOTE) The Xpert Xpress SARS-CoV-2/FLU/RSV plus assay is intended as an aid in the diagnosis of influenza from Nasopharyngeal swab specimens and should not be used as a sole basis for treatment. Nasal washings and aspirates are unacceptable for Xpert Xpress SARS-CoV-2/FLU/RSV testing.  Fact Sheet for Patients: EntrepreneurPulse.com.au  Fact Sheet for Healthcare Providers: IncredibleEmployment.be  This test is not yet approved or cleared by the [place] FDA and has been authorized for detection and/or diagnosis of SARS-CoV-2 by FDA under an Emergency Use Authorization (EUA). This EUA will remain in effect (meaning this test can be used) for the duration of the COVID-19 declaration under Section 564(b)(1) of the Act, 21 U.S.C. section 360bbb-3(b)(1), unless the authorization is terminated or revoked.  Performed at [facility], [place] [address] [place], [place] [address]   Urine Culture     Status: Abnormal   Collection Time: 12/25/20  2:31 PM   Specimen: Urine, Catheterized  Result Value Ref Range Status   Specimen Description   Final    URINE, CATHETERIZED Performed at [facility], [address], [place], [place] [address]    Special Requests   Final    NONE Performed at [facility], [address] [place], [place] [address]    Culture (A)  Final    80,000 COLONIES/mL STREPTOCOCCUS PYOGENES Beta hemolytic streptococci are predictably susceptible to penicillin and other beta lactams. Susceptibility testing not routinely performed. Performed at [facility], [place] [address] [place], [place] [address]    Report Status 12/27/2020 FINAL  Final  Culture, blood (Routine X 2) w Reflex to ID Panel  Status: Abnormal    Collection Time: 12/25/20  3:44 PM   Specimen: BLOOD RIGHT FOREARM  Result Value Ref Range Status   Specimen Description   Final    BLOOD RIGHT FOREARM Performed at [facility], [address] [place], [place] [address]    Special Requests   Final    BOTTLES DRAWN AEROBIC AND ANAEROBIC Blood Culture adequate volume Performed at [facility], [address], [place], [place] [address]    Culture  Setup Time   Final    GRAM POSITIVE COCCI IN BOTH AEROBIC AND ANAEROBIC BOTTLES Gram Stain Report Called to,Read Back By and Verified With: WATSON @ 1019 ON [id number] BY HENDERSON L CRITICAL RESULT CALLED TO, READ BACK BY AND VERIFIED WITH: S HURTH PHARMD @1400  12/26/21 EB Performed at [facility], [place] [address] [place], [place] [address]    Culture GROUP A STREP (S.PYOGENES) ISOLATED (A)  Final   Report Status 12/28/2020 FINAL  Final   Organism ID, Bacteria GROUP A STREP (S.PYOGENES) ISOLATED  Final      Susceptibility   Group a strep (s.pyogenes) isolated - MIC*    PENICILLIN <=0.06 SENSITIVE Sensitive     CEFTRIAXONE <=0.12 SENSITIVE Sensitive     ERYTHROMYCIN <=0.12 SENSITIVE Sensitive     LEVOFLOXACIN 0.5 SENSITIVE Sensitive     VANCOMYCIN 0.5 SENSITIVE Sensitive     * GROUP A STREP (S.PYOGENES) ISOLATED  Blood Culture ID Panel (Reflexed)     Status: Abnormal   Collection Time: 12/25/20  3:44 PM  Result Value Ref Range Status   Enterococcus faecalis NOT DETECTED NOT DETECTED Final   Enterococcus Faecium NOT DETECTED NOT DETECTED Final   Listeria monocytogenes NOT DETECTED NOT DETECTED Final   Staphylococcus species NOT DETECTED NOT DETECTED Final   Staphylococcus aureus (BCID) NOT DETECTED NOT DETECTED Final   Staphylococcus epidermidis NOT DETECTED NOT DETECTED Final   Staphylococcus lugdunensis NOT DETECTED NOT DETECTED Final   Streptococcus species DETECTED (A) NOT DETECTED Final    Comment: CRITICAL RESULT CALLED TO, READ BACK BY AND VERIFIED WITH: S HURTH PHARMD @1400   12/26/21 EB    Streptococcus agalactiae NOT DETECTED NOT DETECTED Final   Streptococcus pneumoniae NOT DETECTED NOT DETECTED Final   Streptococcus pyogenes DETECTED (A) NOT DETECTED Final    Comment: CRITICAL RESULT CALLED TO, READ BACK BY AND VERIFIED WITH: S HURTH PHARMD @1400  12/26/21 EB    A.calcoaceticus-baumannii NOT DETECTED NOT DETECTED Final   Bacteroides fragilis NOT DETECTED NOT DETECTED Final   Enterobacterales NOT DETECTED NOT DETECTED Final   Enterobacter cloacae complex NOT DETECTED NOT DETECTED Final   Escherichia coli NOT DETECTED NOT DETECTED Final   Klebsiella aerogenes NOT DETECTED NOT DETECTED Final   Klebsiella oxytoca NOT DETECTED NOT DETECTED Final   Klebsiella pneumoniae NOT DETECTED NOT DETECTED Final   Proteus species NOT DETECTED NOT DETECTED Final   Salmonella species NOT DETECTED NOT DETECTED Final   Serratia marcescens NOT DETECTED NOT DETECTED Final   Haemophilus influenzae NOT DETECTED NOT DETECTED Final   Neisseria meningitidis NOT DETECTED NOT DETECTED Final   Pseudomonas aeruginosa NOT DETECTED NOT DETECTED Final   Stenotrophomonas maltophilia NOT DETECTED NOT DETECTED Final   Candida albicans NOT DETECTED NOT DETECTED Final   Candida auris NOT DETECTED NOT DETECTED Final   Candida glabrata NOT DETECTED NOT DETECTED Final   Candida krusei NOT DETECTED NOT DETECTED Final   Candida parapsilosis NOT DETECTED NOT DETECTED Final   Candida tropicalis NOT DETECTED NOT DETECTED Final  Cryptococcus neoformans/gattii NOT DETECTED NOT DETECTED Final    Comment: Performed at [facility], [place] [address], [place], [place] [address]  Resp Panel by RT-PCR (Flu A&B, Covid) Nasopharyngeal Swab     Status: None   Collection Time: 12/25/20  4:15 PM   Specimen: Nasopharyngeal Swab; Nasopharyngeal(NP) swabs in vial transport medium  Result Value Ref Range Status   SARS Coronavirus 2 by RT PCR NEGATIVE NEGATIVE Final    Comment: (NOTE) SARS-CoV-2 target nucleic  acids are NOT DETECTED.  The SARS-CoV-2 RNA is generally detectable in upper respiratory specimens during the acute phase of infection. The lowest concentration of SARS-CoV-2 viral copies this assay can detect is 138 copies/mL. A negative result does not preclude SARS-Cov-2 infection and should not be used as the sole basis for treatment or other patient management decisions. A negative result may occur with  improper specimen collection/handling, submission of specimen other than nasopharyngeal swab, presence of viral mutation(s) within the areas targeted by this assay, and inadequate number of viral copies(<138 copies/mL). A negative result must be combined with clinical observations, patient history, and epidemiological information. The expected result is Negative.  Fact Sheet for Patients:  EntrepreneurPulse.com.au  Fact Sheet for Healthcare Providers:  IncredibleEmployment.be  This test is no t yet approved or cleared by the [place] FDA and  has been authorized for detection and/or diagnosis of SARS-CoV-2 by FDA under an Emergency Use Authorization (EUA). This EUA will remain  in effect (meaning this test can be used) for the duration of the COVID-19 declaration under Section 564(b)(1) of the Act, 21 U.S.C.section 360bbb-3(b)(1), unless the authorization is terminated  or revoked sooner.       Influenza A by PCR NEGATIVE NEGATIVE Final   Influenza B by PCR NEGATIVE NEGATIVE Final    Comment: (NOTE) The Xpert Xpress SARS-CoV-2/FLU/RSV plus assay is intended as an aid in the diagnosis of influenza from Nasopharyngeal swab specimens and should not be used as a sole basis for treatment. Nasal washings and aspirates are unacceptable for Xpert Xpress SARS-CoV-2/FLU/RSV testing.  Fact Sheet for Patients: EntrepreneurPulse.com.au  Fact Sheet for Healthcare Providers: IncredibleEmployment.be  This  test is not yet approved or cleared by the [place] FDA and has been authorized for detection and/or diagnosis of SARS-CoV-2 by FDA under an Emergency Use Authorization (EUA). This EUA will remain in effect (meaning this test can be used) for the duration of the COVID-19 declaration under Section 564(b)(1) of the Act, 21 U.S.C. section 360bbb-3(b)(1), unless the authorization is terminated or revoked.  Performed at [facility], [address] [place], [place] [address]   Culture, blood (Routine X 2) w Reflex to ID Panel     Status: None (Preliminary result)   Collection Time: 12/25/20  4:24 PM   Specimen: BLOOD LEFT HAND  Result Value Ref Range Status   Specimen Description BLOOD LEFT HAND  Final   Special Requests   Final    BOTTLES DRAWN AEROBIC AND ANAEROBIC Blood Culture results may not be optimal due to an excessive volume of blood received in culture bottles   Culture   Final    NO GROWTH 2 DAYS Performed at [facility], [address] [place], [place] [address]    Report Status PENDING  Incomplete     Time coordinating discharge: 35 minutes  SIGNED:   Rodena Goldmann, DO [facility] 12/28/2020, 8:28 AM  If 7PM-7AM, please contact night-coverage [email or web address]

## 2020-12-27 NOTE — Progress Notes (Signed)
Received report from [place], Therapist, sports. Assumed pt care at 0700. Pt resting comfortably in bed in no acute distress. A&O, intermittent confusion. VSS. Call bell within reach. Will continue to monitor.

## 2020-12-27 NOTE — NC FL2 (Signed)
[place] LEVEL OF CARE SCREENING TOOL     IDENTIFICATION  Patient Name: Albert Garrison Birthdate: 1945/12/26 Sex: male Admission Date (Current Location): 12/25/2020  [facility] and [place] Number:  [employer] and Address:  [place] [address] [place]      Provider Number: [id number]  Attending Physician Name and Address:  Rodena Goldmann, DO  Relative Name and Phone Number:  Revin, Corker (Brother)   [phone]    Current Level of Care: Hospital Recommended Level of Care: [place] Prior Approval Number:    Date Approved/Denied:   PASRR Number: [id number]  Discharge Plan: SNF    Current Diagnoses: Patient Active Problem List   Diagnosis Date Noted   Acute lower UTI 12/25/2020   Dysphagia 12/22/2020   Acute hypoxemic respiratory failure ([place]) 11/30/2020   Shock circulatory ([place]) 11/30/2020   Cardiac arrest ([place]) 11/30/2020   Other specified cardiac arrhythmias--- sleep apnea- arrhythmias when he desaturates  11/30/2020   Endotracheally intubated 11/30/2020   SBO (small bowel obstruction) ([place]) 11/28/2020   History of stroke 08/24/2017   Altered mental status 08/23/2017   Sleep apnea 08/23/2017   Elevated troponin 08/23/2017   Abnormal CT of the head 08/23/2017   Hypertension 08/23/2017   Type 2 diabetes mellitus ([place]) 08/23/2017   Syncope 08/23/2017   S/P carpal tunnel release right 07/13/17 07/27/2017   AKI (acute kidney injury) ([place]) 11/29/2015   Lower GI bleed 11/28/2015   Rectal bleed 11/28/2015   Rectal bleeding 11/28/2015   Benign prostatic hyperplasia with urinary retention 01/20/2015   Urinary retention 05/21/2014   ARF (acute renal failure) ([place]) 03/26/2014   Type 2 diabetes mellitus without complication, with long-term current use of insulin ([place]) 03/26/2014   UTI (lower urinary tract infection) 03/24/2014   Hyponatremia 03/24/2014   Acute encephalopathy 03/24/2014     Orientation RESPIRATION BLADDER Height & Weight     Self, Time, Situation, Place  O2 (2L) Incontinent Weight: 168 lb 10.4 oz (76.5 kg) Height:  5\' 11"  (180.3 cm)  BEHAVIORAL SYMPTOMS/MOOD NEUROLOGICAL BOWEL NUTRITION STATUS      Continent Diet (DIET DYS 2 Room service appropriate? Yes; Fluid consistency: Thin)  AMBULATORY STATUS COMMUNICATION OF NEEDS Skin   Limited Assist Verbally Other (Comment) (Rash Left Groin)                       Personal Care Assistance Level of Assistance    Bathing Assistance: Limited assistance Feeding assistance: Independent Dressing Assistance: Limited assistance     Functional Limitations Info  Sight, Hearing, Speech Sight Info: Adequate Hearing Info: Impaired Speech Info: Adequate    SPECIAL CARE FACTORS FREQUENCY  PT (By licensed PT), OT (By licensed OT)     PT Frequency: 5x OT Frequency: 5x            Contractures Contractures Info: Not present    Additional Factors Info  Code Status, Allergies, Insulin Sliding Scale Code Status Info: Full Allergies Info: n/a   Insulin Sliding Scale Info: CBG 70 - 120: 0 units    CBG 121 - 150: 2 units    CBG 151 - 200: 3 units    CBG 201 - 250: 5 units    CBG 251 - 300: 8 units    CBG 301 - 350: 11 units    CBG 351 - 400: 15 units       Current Medications (12/27/2020):  This is the current  hospital active medication list Current Facility-Administered Medications  Medication Dose Route Frequency Provider Last Rate Last Admin   0.9 %  sodium chloride infusion   Intravenous Continuous Truett Mainland, DO 75 mL/hr at 12/27/20 0518 New Bag at 12/27/20 0518   acetaminophen (TYLENOL) tablet 650 mg  650 mg Oral Q6H PRN Adefeso, Olad[place], DO   650 mg at 12/27/20 0123   amLODipine (NORVASC) tablet 10 mg  10 mg Oral Daily Truett Mainland, DO   10 mg at 12/27/20 2951   aspirin chewable tablet 81 mg  81 mg Oral Daily Truett Mainland, DO   81 mg at 12/27/20 0858   atorvastatin (LIPITOR) tablet 40  mg  40 mg Oral [id number] Truett Mainland, DO   40 mg at 12/26/20 1736   cefTRIAXone (ROCEPHIN) 2 g in sodium chloride 0.9 % 100 mL IVPB  2 g Intravenous Q24H Truett Mainland, DO   Stopped at 12/26/20 1641   Chlorhexidine Gluconate Cloth 2 % PADS 6 each  6 each Topical Daily Truett Mainland, DO   6 each at 12/26/20 0840   insulin aspart (novoLOG) injection 0-15 Units  0-15 Units Subcutaneous TID WC Truett Mainland, DO   3 Units at 12/27/20 1248   metoprolol tartrate (LOPRESSOR) tablet 25 mg  25 mg Oral BID Truett Mainland, DO   25 mg at 12/27/20 0858   ondansetron (ZOFRAN) tablet 4 mg  4 mg Oral Q6H PRN Truett Mainland, DO       Or   ondansetron [facility]) injection 4 mg  4 mg Intravenous Q6H PRN Truett Mainland, DO       phenol (CHLORASEPTIC) mouth spray 1 spray  1 spray Mouth/Throat PRN Manuella Ghazi, Pratik D, DO       tamsulosin (FLOMAX) capsule 0.4 mg  0.4 mg Oral QPC supper Truett Mainland, DO   0.4 mg at 12/26/20 1736     Discharge Medications: Please see discharge summary for a list of discharge medications.  Relevant Imaging Results:  Relevant Lab Results:   Additional Information Pt SSN [id number]  Natasha Bence, LCSW

## 2020-12-28 LAB — CULTURE, BLOOD (ROUTINE X 2): Special Requests: ADEQUATE

## 2020-12-28 LAB — GLUCOSE, CAPILLARY
Glucose-Capillary: 196 mg/dL — ABNORMAL HIGH (ref 70–99)
Glucose-Capillary: 176 mg/dL — ABNORMAL HIGH (ref 70–99)
Glucose-Capillary: 193 mg/dL — ABNORMAL HIGH (ref 70–99)
Glucose-Capillary: 191 mg/dL — ABNORMAL HIGH (ref 70–99)

## 2020-12-28 MED ORDER — HALOPERIDOL 0.5 MG PO TABS
0.5000 mg | ORAL_TABLET | Freq: Two times a day (BID) | ORAL | Status: DC
  Administered 2020-12-28 – 2021-01-11 (×29): 0.5 mg via ORAL
  Filled 2020-12-28 (×29): qty 1

## 2020-12-28 MED ORDER — HALOPERIDOL 0.5 MG PO TABS: 0.5000 mg | ORAL_TABLET | Freq: Two times a day (BID) | ORAL | 0 refills | Status: DC

## 2020-12-28 NOTE — Progress Notes (Signed)
Patient seen and evaluated this a.m. and is awaiting transfer to his facility.  He has issues with ongoing agitation likely related to dementia with some behavioral disturbances.  Prescribed oral Haldol 0.5 mg twice daily to assist with behavioral issues.  This will be given prior to discharge.  Please refer to discharge summary dictated 9/25 for full details.  No other acute overnight events noted.  Total care time: 20 minutes.

## 2020-12-28 NOTE — Care Management Important Message (Signed)
Important Message  Patient Details  Name: Albert Garrison MRN: [id number] Date of Birth: Mar 15, 1946   Medicare Important Message Given:  Yes     [name] 12/28/2020, 11:16 AM

## 2020-12-28 NOTE — TOC Initial Note (Signed)
Transition of Care (TOC) - Initial/Assessment Note    Patient Details  Name: Albert Garrison MRN: [id number] Date of Birth: 11/01/1945  Transition of Care (TOC) CM/SW Contact:     , LCSW Phone Number: 12/28/2020, 3:18 PM  Clinical Narrative:                  TOC following. Pt admitted to [facility] from [facility] where he had been for rehab for two days. [facility] received pt from [facility]. Pt is medically stable for dc per MD. Insurance has denied further SNF rehab stating pt is custodial care. Peer to Peer appeal was completed and pt continues to be denied. MD states that due to his cognitive deficits, pt is not safe to return home alone.  Spoke with pt's brother, John, and also his aunt, Betty, who state that pt previously lived independently in his own apartment. There are reportedly no family members to care for pt. John does not live in the area and Betty is in her 80's. Family also cannot access pt's money to pay bills as they do not have financial POA. Betty states that pt lives in low income housing. He has Medicaid to assist with his Medicare premiums but it is not long term care Medicaid.  This LCSW met with pt to assess. Pt oriented to person only. Pt is clearly not safe to live alone at this time.  Referral made to APS for assistance with temporary guardianship and payee for pt's funds. Unless family members decide that they can take pt home with them, pt will need placement likely at a [facility] or ALF. APS will come assess pt and file for the guardianship if they determine it is appropriate.  Updated John and will follow up in AM.   Barriers to Discharge: Unsafe home situation   Patient Goals and CMS Choice Patient states their goals for this hospitalization and ongoing recovery are:: pt confused, family wants help with placement CMS Medicare.gov Compare Post Acute Care list provided to:: Patient Represenative (must comment) Choice offered to / list presented to :  Sibling  Expected Discharge Plan and Services   In-house Referral: Clinical Social Work       Expected Discharge Date: 12/27/20                                    Prior Living Arrangements/Services     Patient language and need for interpreter reviewed:: Yes Do you feel safe going back to the place where you live?: No   pt can't live alone, insurance denied SNF rehab  Need for Family Participation in Patient Care: Yes (Comment) Care giver support system in place?: No (comment)   Criminal Activity/Legal Involvement Pertinent to Current Situation/Hospitalization: No - Comment as needed  Activities of Daily Living Home Assistive Devices/Equipment: None ADL Screening (condition at time of admission) Patient's cognitive ability adequate to safely complete daily activities?: Yes Is the patient deaf or have difficulty hearing?: Yes Does the patient have difficulty seeing, even when wearing glasses/contacts?: No Does the patient have difficulty concentrating, remembering, or making decisions?: No Patient able to express need for assistance with ADLs?: Yes Does the patient have difficulty dressing or bathing?: No Independently performs ADLs?: Yes (appropriate for developmental age) Does the patient have difficulty walking or climbing stairs?: No Weakness of Legs: None Weakness of Arms/Hands: None  Permission Sought/Granted                    Emotional Assessment   Attitude/Demeanor/Rapport: Engaged Affect (typically observed): Pleasant Orientation: : Oriented to Self Alcohol / Substance Use: Not Applicable Psych Involvement: No (comment)  Admission diagnosis:  Hemorrhagic cystitis [N30.91] Sepsis (HCC) [A41.9] Fever, unspecified fever cause [R50.9] Hematuria, unspecified type [R31.9] Patient Active Problem List   Diagnosis Date Noted   Acute lower UTI 12/25/2020   Dysphagia 12/22/2020   Acute hypoxemic respiratory failure (HCC) 11/30/2020   Shock circulatory  (HCC) 11/30/2020   Cardiac arrest (HCC) 11/30/2020   Other specified cardiac arrhythmias--- sleep apnea- arrhythmias when he desaturates  11/30/2020   Endotracheally intubated 11/30/2020   SBO (small bowel obstruction) (HCC) 11/28/2020   History of stroke 08/24/2017   Altered mental status 08/23/2017   Sleep apnea 08/23/2017   Elevated troponin 08/23/2017   Abnormal CT of the head 08/23/2017   Hypertension 08/23/2017   Type 2 diabetes mellitus (HCC) 08/23/2017   Syncope 08/23/2017   S/P carpal tunnel release right 07/13/17 07/27/2017   AKI (acute kidney injury) (HCC) 11/29/2015   Lower GI bleed 11/28/2015   Rectal bleed 11/28/2015   Rectal bleeding 11/28/2015   Benign prostatic hyperplasia with urinary retention 01/20/2015   Urinary retention 05/21/2014   ARF (acute renal failure) (HCC) 03/26/2014   Type 2 diabetes mellitus without complication, with long-term current use of insulin (HCC) 03/26/2014   UTI (lower urinary tract infection) 03/24/2014   Hyponatremia 03/24/2014   Acute encephalopathy 03/24/2014   PCP:  Blass, Joel, MD Pharmacy:   [facility] - [place], [place] - [address] [place] [address] Phone: [phone] Fax: [phone]     Social Determinants of Health (SDOH) Interventions    Readmission Risk Interventions No flowsheet data found.   

## 2020-12-29 DIAGNOSIS — N390 Urinary tract infection, site not specified: Secondary | ICD-10-CM | POA: Diagnosis not present

## 2020-12-29 LAB — GLUCOSE, CAPILLARY
Glucose-Capillary: 185 mg/dL — ABNORMAL HIGH (ref 70–99)
Glucose-Capillary: 189 mg/dL — ABNORMAL HIGH (ref 70–99)
Glucose-Capillary: 142 mg/dL — ABNORMAL HIGH (ref 70–99)
Glucose-Capillary: 236 mg/dL — ABNORMAL HIGH (ref 70–99)

## 2020-12-29 MED ORDER — ENOXAPARIN SODIUM 40 MG/0.4ML IJ SOSY
40.0000 mg | PREFILLED_SYRINGE | INTRAMUSCULAR | Status: DC
  Administered 2020-12-29 – 2021-01-10 (×13): 40 mg via SUBCUTANEOUS
  Filled 2020-12-29 (×13): qty 0.4

## 2020-12-29 NOTE — Progress Notes (Signed)
PROGRESS NOTE    Albert Garrison  RJJ:[id number] DOB: 08-Jun-1945 DOA: 12/25/2020 PCP: Caprice Renshaw, MD   Brief Narrative:   Albert Garrison is a 75 y.o. male with a history of type 2 diabetes, stage III chronic kidney disease, BPH, history of stroke, GERD, history of Cardiac Arrest.  The patient was recently admitted on 11/27/2020 and was discharged approximately 3 days ago from the hospital.  During that hospitalization, the patient went into cardiac arrest and had acute hypoxemic respiratory failure, he was intubated and ROSC was obtained after about 1 minute.  The patient now presents with hematuria.  He has been admitted with some acute metabolic encephalopathy in the setting of catheter associated UTI.  He continues to have some intermittent confusion, but he does not qualify for SNF and therefore is unsafe for discharge.  APS evaluation pending as well as guardianship.  He does have group A strep bacteremia for which she is undergoing treatment with IV Rocephin.  This can be switched to oral cefdinir to complete a total 10-day course on discharge.  Assessment & Plan:   Principal Problem:   Acute lower UTI Active Problems:   Type 2 diabetes mellitus without complication, with long-term current use of insulin (HCC)   Benign prostatic hyperplasia with urinary retention   Hypertension   History of stroke   Acute metabolic encephalopathy versus dementia secondary to CAUTI-improved -Baseline mentation is a little uncertain, but he does not appear safe for discharge to home on his own -TOC assisting with APS involvement and obtaining guardianship -Patient noted to have hematuria on presentation -Continue Rocephin for bacteremia as noted below and urine cultures with no growth -He might have had some foley trauma at SNF according to the brother   Group A strep bacteremia -Plan to treat with 10-day course of antibiotics -Currently on day 5 of Rocephin -Can switch to oral cefdinir on  discharge -No need to evaluate further for endocarditis -Discussed case with Dr. Gale Journey with ID   Type 2 diabetes -DYS 2 diet -SSI   Mild hyponatremia -Continue to monitor   BPH with Foley catheter -Tried voiding trial 9/27, but he continued to have issues with urine retention -Foley catheter replaced on 9/27 and he will need outpatient urology follow-up -Continue Flomax   History of stroke -Continue aspirin -DYS 2 diet   History of NSTEMI -Continue aspirin, Lipitor, beta-blocker     DVT prophylaxis: Lovenox Code Status: Full Family Communication: Discussed with brother on phone 9/24 Disposition Plan:  Status is: Inpatient   Remains inpatient appropriate because:IV treatments appropriate due to intensity of illness or inability to take PO and Inpatient level of care appropriate due to severity of illness   Dispo: The patient is from: SNF              Anticipated d/c is to: SNF              Patient currently is not medically stable to d/c.              Difficult to place patient No   Consultants:  None   Procedures:  See below  Antimicrobials:  Anti-infectives (From admission, onward)    Start     Dose/Rate Route Frequency Ordered Stop   12/27/20 0000  cefdinir (OMNICEF) 300 MG capsule        300 mg Oral 2 times daily 12/27/20 1037 01/04/21 2359   12/26/20 1600  cefTRIAXone (ROCEPHIN) 2 g in sodium chloride 0.9 %  100 mL IVPB        2 g 200 mL/hr over 30 Minutes Intravenous Every 24 hours 12/25/20 2234     12/25/20 1800  cefTRIAXone (ROCEPHIN) 1 g in sodium chloride 0.9 % 100 mL IVPB        1 g 200 mL/hr over 30 Minutes Intravenous  Once 12/25/20 1755 12/25/20 1847   12/25/20 1515  cefTRIAXone (ROCEPHIN) 1 g in sodium chloride 0.9 % 100 mL IVPB        1 g 200 mL/hr over 30 Minutes Intravenous  Once 12/25/20 1510 12/25/20 1635   12/25/20 0000  cephALEXin (KEFLEX) 500 MG capsule  Status:  Discontinued        500 mg Oral 4 times daily 12/25/20 1533 12/27/20         Subjective: Patient seen and evaluated today with no new acute complaints or concerns. No acute concerns or events noted overnight.  Objective: Vitals:   12/28/20 2202 12/29/20 0634 12/29/20 0800 12/29/20 1339  BP: 123/72 123/71 140/66 (!) 118/57  Pulse: 69 65 69 (!) 59  Resp: 20 19  15   Temp: 98.2 F (36.8 C) 97.8 F (36.6 C) 97.7 F (36.5 C) 98.6 F (37 C)  TempSrc: Oral Oral Oral   SpO2: 97% 95% 97% 97%  Weight:      Height:        Intake/Output Summary (Last 24 hours) at 12/29/2020 1452 Last data filed at 12/29/2020 1300 Gross per 24 hour  Intake 720 ml  Output 5050 ml  Net -4330 ml   Filed Weights   12/25/20 2058  Weight: 76.5 kg    Examination:  General exam: Appears calm and comfortable  Respiratory system: Clear to auscultation. Respiratory effort normal. Cardiovascular system: S1 & S2 heard, RRR.  Gastrointestinal system: Abdomen is soft Central nervous system: Alert and awake Extremities: No edema Skin: No significant lesions noted Psychiatry: Flat affect. Foley with clear yellow urine output noted   Data Reviewed: I have personally reviewed following labs and imaging studies  CBC: Recent Labs  Lab 12/25/20 1624 12/26/20 0606 12/27/20 0530  WBC 20.6* 16.6* 8.3  NEUTROABS 17.2*  --   --   HGB 12.3* 11.0* 11.4*  HCT 37.3* 33.9* 35.2*  MCV 91.0 90.4 91.2  PLT 302 288 431   Basic Metabolic Panel: Recent Labs  Lab 12/25/20 1624 12/26/20 0606 12/27/20 0530  NA 132* 133* 134*  K 3.8 3.4* 3.6  CL 102 102 104  CO2 20* 22 23  GLUCOSE 229* 176* 147*  BUN 15 13 11   CREATININE 1.13 1.02 1.03  CALCIUM 8.1* 7.9* 8.0*  MG  --   --  1.8   GFR: Estimated Creatinine Clearance: 65 mL/min (by C-G formula based on SCr of 1.03 mg/dL). Liver Function Tests: Recent Labs  Lab 12/25/20 1624  AST 22  ALT 18  ALKPHOS 89  BILITOT 0.7  PROT 6.8  ALBUMIN 3.0*   No results for input(s): LIPASE, AMYLASE in the last 168 hours. No results for  input(s): AMMONIA in the last 168 hours. Coagulation Profile: No results for input(s): INR, PROTIME in the last 168 hours. Cardiac Enzymes: No results for input(s): CKTOTAL, CKMB, CKMBINDEX, TROPONINI in the last 168 hours. BNP (last 3 results) No results for input(s): PROBNP in the last 8760 hours. HbA1C: No results for input(s): HGBA1C in the last 72 hours. CBG: Recent Labs  Lab 12/28/20 1136 12/28/20 1611 12/28/20 2128 12/29/20 0732 12/29/20 1128  GLUCAP 176* 193* 191*  185* 189*   Lipid Profile: No results for input(s): CHOL, HDL, LDLCALC, TRIG, CHOLHDL, LDLDIRECT in the last 72 hours. Thyroid Function Tests: No results for input(s): TSH, T4TOTAL, FREET4, T3FREE, THYROIDAB in the last 72 hours. Anemia Panel: No results for input(s): VITAMINB12, FOLATE, FERRITIN, TIBC, IRON, RETICCTPCT in the last 72 hours. Sepsis Labs: Recent Labs  Lab 12/25/20 1624  LATICACIDVEN 1.6    Recent Results (from the past 240 hour(s))  Resp Panel by RT-PCR (Flu A&B, Covid) Nasopharyngeal Swab     Status: None   Collection Time: 12/23/20 11:37 AM   Specimen: Nasopharyngeal Swab; Nasopharyngeal(NP) swabs in vial transport medium  Result Value Ref Range Status   SARS Coronavirus 2 by RT PCR NEGATIVE NEGATIVE Final    Comment: (NOTE) SARS-CoV-2 target nucleic acids are NOT DETECTED.  The SARS-CoV-2 RNA is generally detectable in upper respiratory specimens during the acute phase of infection. The lowest concentration of SARS-CoV-2 viral copies this assay can detect is 138 copies/mL. A negative result does not preclude SARS-Cov-2 infection and should not be used as the sole basis for treatment or other patient management decisions. A negative result may occur with  improper specimen collection/handling, submission of specimen other than nasopharyngeal swab, presence of viral mutation(s) within the areas targeted by this assay, and inadequate number of viral copies(<138 copies/mL). A negative  result must be combined with clinical observations, patient history, and epidemiological information. The expected result is Negative.  Fact Sheet for Patients:  EntrepreneurPulse.com.au  Fact Sheet for Healthcare Providers:  IncredibleEmployment.be  This test is no t yet approved or cleared by the [place] FDA and  has been authorized for detection and/or diagnosis of SARS-CoV-2 by FDA under an Emergency Use Authorization (EUA). This EUA will remain  in effect (meaning this test can be used) for the duration of the COVID-19 declaration under Section 564(b)(1) of the Act, 21 U.S.C.section 360bbb-3(b)(1), unless the authorization is terminated  or revoked sooner.       Influenza A by PCR NEGATIVE NEGATIVE Final   Influenza B by PCR NEGATIVE NEGATIVE Final    Comment: (NOTE) The Xpert Xpress SARS-CoV-2/FLU/RSV plus assay is intended as an aid in the diagnosis of influenza from Nasopharyngeal swab specimens and should not be used as a sole basis for treatment. Nasal washings and aspirates are unacceptable for Xpert Xpress SARS-CoV-2/FLU/RSV testing.  Fact Sheet for Patients: EntrepreneurPulse.com.au  Fact Sheet for Healthcare Providers: IncredibleEmployment.be  This test is not yet approved or cleared by the [place] FDA and has been authorized for detection and/or diagnosis of SARS-CoV-2 by FDA under an Emergency Use Authorization (EUA). This EUA will remain in effect (meaning this test can be used) for the duration of the COVID-19 declaration under Section 564(b)(1) of the Act, 21 U.S.C. section 360bbb-3(b)(1), unless the authorization is terminated or revoked.  Performed at [facility], [place] [address], [place], [place] [address]   Urine Culture     Status: Abnormal   Collection Time: 12/25/20  2:31 PM   Specimen: Urine, Catheterized  Result Value Ref Range Status   Specimen  Description   Final    URINE, CATHETERIZED Performed at [facility], [address], [place], [place] [address]    Special Requests   Final    NONE Performed at [facility], [address], [address] [place], [place] [address]    Culture (A)  Final    80,000 COLONIES/mL STREPTOCOCCUS PYOGENES Beta hemolytic streptococci are predictably susceptible to penicillin and other  beta lactams. Susceptibility testing not routinely performed. Performed at [facility], [place] [address], [place], [place] [address]    Report Status 12/27/2020 FINAL  Final  Culture, blood (Routine X 2) w Reflex to ID Panel     Status: Abnormal   Collection Time: 12/25/20  3:44 PM   Specimen: BLOOD RIGHT FOREARM  Result Value Ref Range Status   Specimen Description   Final    BLOOD RIGHT FOREARM Performed at [facility], [address] [place], [place] [address]    Special Requests   Final    BOTTLES DRAWN AEROBIC AND ANAEROBIC Blood Culture adequate volume Performed at [facility], [address], [place], [place] [address]    Culture  Setup Time   Final    GRAM POSITIVE COCCI IN BOTH AEROBIC AND ANAEROBIC BOTTLES Gram Stain Report Called to,Read Back By and Verified With: WATSON @ 1019 ON [id number] BY HENDERSON L CRITICAL RESULT CALLED TO, READ BACK BY AND VERIFIED WITH: S HURTH PHARMD @1400  12/26/21 EB Performed at [facility], [place] [address] [place], [place] [address]    Culture GROUP A STREP (S.PYOGENES) ISOLATED (A)  Final   Report Status 12/28/2020 FINAL  Final   Organism ID, Bacteria GROUP A STREP (S.PYOGENES) ISOLATED  Final      Susceptibility   Group a strep (s.pyogenes) isolated - MIC*    PENICILLIN <=0.06 SENSITIVE Sensitive     CEFTRIAXONE <=0.12 SENSITIVE Sensitive     ERYTHROMYCIN <=0.12 SENSITIVE Sensitive     LEVOFLOXACIN 0.5 SENSITIVE Sensitive     VANCOMYCIN 0.5 SENSITIVE Sensitive     * GROUP A STREP (S.PYOGENES) ISOLATED  Blood Culture ID Panel (Reflexed)     Status: Abnormal    Collection Time: 12/25/20  3:44 PM  Result Value Ref Range Status   Enterococcus faecalis NOT DETECTED NOT DETECTED Final   Enterococcus Faecium NOT DETECTED NOT DETECTED Final   Listeria monocytogenes NOT DETECTED NOT DETECTED Final   Staphylococcus species NOT DETECTED NOT DETECTED Final   Staphylococcus aureus (BCID) NOT DETECTED NOT DETECTED Final   Staphylococcus epidermidis NOT DETECTED NOT DETECTED Final   Staphylococcus lugdunensis NOT DETECTED NOT DETECTED Final   Streptococcus species DETECTED (A) NOT DETECTED Final    Comment: CRITICAL RESULT CALLED TO, READ BACK BY AND VERIFIED WITH: S HURTH PHARMD @1400  12/26/21 EB    Streptococcus agalactiae NOT DETECTED NOT DETECTED Final   Streptococcus pneumoniae NOT DETECTED NOT DETECTED Final   Streptococcus pyogenes DETECTED (A) NOT DETECTED Final    Comment: CRITICAL RESULT CALLED TO, READ BACK BY AND VERIFIED WITH: S HURTH PHARMD @1400  12/26/21 EB    A.calcoaceticus-baumannii NOT DETECTED NOT DETECTED Final   Bacteroides fragilis NOT DETECTED NOT DETECTED Final   Enterobacterales NOT DETECTED NOT DETECTED Final   Enterobacter cloacae complex NOT DETECTED NOT DETECTED Final   Escherichia coli NOT DETECTED NOT DETECTED Final   Klebsiella aerogenes NOT DETECTED NOT DETECTED Final   Klebsiella oxytoca NOT DETECTED NOT DETECTED Final   Klebsiella pneumoniae NOT DETECTED NOT DETECTED Final   Proteus species NOT DETECTED NOT DETECTED Final   Salmonella species NOT DETECTED NOT DETECTED Final   Serratia marcescens NOT DETECTED NOT DETECTED Final   Haemophilus influenzae NOT DETECTED NOT DETECTED Final   Neisseria meningitidis NOT DETECTED NOT DETECTED Final   Pseudomonas aeruginosa NOT DETECTED NOT DETECTED Final   Stenotrophomonas maltophilia NOT DETECTED NOT DETECTED Final   Candida albicans NOT DETECTED NOT DETECTED Final  Candida auris NOT DETECTED NOT DETECTED Final   Candida glabrata NOT DETECTED NOT DETECTED Final   Candida  krusei NOT DETECTED NOT DETECTED Final   Candida parapsilosis NOT DETECTED NOT DETECTED Final   Candida tropicalis NOT DETECTED NOT DETECTED Final   Cryptococcus neoformans/gattii NOT DETECTED NOT DETECTED Final    Comment: Performed at [facility], [place] [address], [place], [place] [address]  Resp Panel by RT-PCR (Flu A&B, Covid) Nasopharyngeal Swab     Status: None   Collection Time: 12/25/20  4:15 PM   Specimen: Nasopharyngeal Swab; Nasopharyngeal(NP) swabs in vial transport medium  Result Value Ref Range Status   SARS Coronavirus 2 by RT PCR NEGATIVE NEGATIVE Final    Comment: (NOTE) SARS-CoV-2 target nucleic acids are NOT DETECTED.  The SARS-CoV-2 RNA is generally detectable in upper respiratory specimens during the acute phase of infection. The lowest concentration of SARS-CoV-2 viral copies this assay can detect is 138 copies/mL. A negative result does not preclude SARS-Cov-2 infection and should not be used as the sole basis for treatment or other patient management decisions. A negative result may occur with  improper specimen collection/handling, submission of specimen other than nasopharyngeal swab, presence of viral mutation(s) within the areas targeted by this assay, and inadequate number of viral copies(<138 copies/mL). A negative result must be combined with clinical observations, patient history, and epidemiological information. The expected result is Negative.  Fact Sheet for Patients:  EntrepreneurPulse.com.au  Fact Sheet for Healthcare Providers:  IncredibleEmployment.be  This test is no t yet approved or cleared by the [place] FDA and  has been authorized for detection and/or diagnosis of SARS-CoV-2 by FDA under an Emergency Use Authorization (EUA). This EUA will remain  in effect (meaning this test can be used) for the duration of the COVID-19 declaration under Section 564(b)(1) of the Act, 21 U.S.C.section  360bbb-3(b)(1), unless the authorization is terminated  or revoked sooner.       Influenza A by PCR NEGATIVE NEGATIVE Final   Influenza B by PCR NEGATIVE NEGATIVE Final    Comment: (NOTE) The Xpert Xpress SARS-CoV-2/FLU/RSV plus assay is intended as an aid in the diagnosis of influenza from Nasopharyngeal swab specimens and should not be used as a sole basis for treatment. Nasal washings and aspirates are unacceptable for Xpert Xpress SARS-CoV-2/FLU/RSV testing.  Fact Sheet for Patients: EntrepreneurPulse.com.au  Fact Sheet for Healthcare Providers: IncredibleEmployment.be  This test is not yet approved or cleared by the [place] FDA and has been authorized for detection and/or diagnosis of SARS-CoV-2 by FDA under an Emergency Use Authorization (EUA). This EUA will remain in effect (meaning this test can be used) for the duration of the COVID-19 declaration under Section 564(b)(1) of the Act, 21 U.S.C. section 360bbb-3(b)(1), unless the authorization is terminated or revoked.  Performed at [facility], [address] [place], [place] [address]   Culture, blood (Routine X 2) w Reflex to ID Panel     Status: None (Preliminary result)   Collection Time: 12/25/20  4:24 PM   Specimen: BLOOD LEFT HAND  Result Value Ref Range Status   Specimen Description BLOOD LEFT HAND  Final   Special Requests   Final    BOTTLES DRAWN AEROBIC AND ANAEROBIC Blood Culture results may not be optimal due to an excessive volume of blood received in culture bottles   Culture   Final    NO GROWTH 4 DAYS Performed at [facility], [address], [place], [place] [address]  Report Status PENDING  Incomplete         Radiology Studies: No results found.      Scheduled Meds:  amLODipine  10 mg Oral Daily   aspirin  81 mg Oral Daily   atorvastatin  40 mg Oral q1800   Chlorhexidine Gluconate Cloth  6 each Topical Daily   haloperidol  0.5 mg Oral BID    insulin aspart  0-15 Units Subcutaneous TID WC   metoprolol tartrate  25 mg Oral BID   tamsulosin  0.4 mg Oral QPC supper   Continuous Infusions:  cefTRIAXone (ROCEPHIN)  IV 2 g (12/28/20 1533)     LOS: 4 days    Time spent: 35 minutes    Apolo Cutshaw Darleen Crocker, DO [facility]  If 7PM-7AM, please contact night-coverage [email or web address] 12/29/2020, 2:52 PM

## 2020-12-29 NOTE — TOC Progression Note (Signed)
Transition of Care [facility]) - Progression Note    Patient Details  Name: Albert Garrison MRN: [id number] Date of Birth: 10-23-1945  Transition [facility]) CM/SW Contact  Shade Flood, LCSW Phone Number: 12/29/2020, 1:37 PM  Clinical Narrative:     TOC following. APS SW was here today to assess pt and TOC notified by APS that they will pursue temporary guardianship for pt. Unless family want to care for pt at one of their homes, pt will need long term care placement.   TOC has started the process to look for placement.  Will follow.    Barriers to Discharge: Unsafe home situation  Expected Discharge Plan and Services   In-house Referral: Clinical Social Work       Expected Discharge Date: 12/27/20                                     Social Determinants of Health (SDOH) Interventions    Readmission Risk Interventions No flowsheet data found.

## 2020-12-29 NOTE — Progress Notes (Signed)
14 fr Foley catheter removed at 0910 without urine output noted at 1400. Bladder scan completed with not of 200+ ml of urine. MD notified. New order received to replace foley catheter. 14 fr foley catheter placed at 1401 with 10 ml balloon inflated. 600 ml of clear yellow urine noted to foley bag.

## 2020-12-29 NOTE — Progress Notes (Signed)
Physical Therapy Treatment Patient Details Name: Albert Garrison MRN: [id number] DOB: September 15, 1945 Today's Date: 12/29/2020   History of Present Illness Albert Garrison is a 75 y.o. male with a history of type 2 diabetes, stage III chronic kidney disease, BPH, history of stroke, GERD, history of Cardiac Arrest.  The patient was recently admitted on 11/27/2020 and was discharged approximately 3 days ago from the hospital.  During that hospitalization, the patient went into cardiac arrest and had acute hypoxemic respiratory failure, he was intubated and ROSC was obtained after about 1 minute.  The patient presented this time with acute metabolic encephalopathy suspected to be related to UTI as well as some noted hematuria on presentation.  His urine cultures have demonstrated no growth, however he has had group A strep bacteremia noted and case was discussed with ID with recommendations to remain on oral cefdinir for 8 more days to complete 10-day course of treatment.  He has had improvement in leukocytosis as well as his mentation and has remained afebrile during the course of the    PT Comments    Patient requires occasional repeated verbal cues to follow directions, demonstrates fair/good return for sitting up at bedside and completing BLE exercises, unsteady on feet when attempting ambulation without Albert and required the use of RW for safety.  Patient demonstrates slightly labored cadence and limited for ambulation mostly due to c/o fatigue with mild SOB.  Patient tolerated sitting up in chair after therapy - nursing staff notified.  Patient will benefit from continued physical therapy in hospital and recommended venue below to increase strength, balance, endurance for safe ADLs and gait.    Recommendations for follow up therapy are one component of a multi-disciplinary discharge planning process, led by the attending physician.  Recommendations may be updated based on patient status, additional functional  criteria and insurance authorization.  Follow Up Recommendations  Supervision for mobility/OOB;Supervision - Intermittent;SNF     Equipment Recommendations  Rolling walker with 5" wheels    Recommendations for Other Services       Precautions / Restrictions Precautions Precautions: Fall Restrictions Weight Bearing Restrictions: No     Mobility  Bed Mobility Overal bed mobility: Needs Assistance Bed Mobility: Supine to Sit     Supine to sit: Supervision Sit to supine: Supervision   General bed mobility comments: requires increased time and repeated verbal cues    Transfers Overall transfer level: Needs assistance Equipment used: Rolling walker (2 wheeled);None Transfers: Sit to/from [employer] to Stand: Min guard Stand pivot transfers: Min guard       General transfer comment: slow labored movement, required use of RW for safety  Ambulation/Gait Ambulation/Gait assistance: Supervision Gait Distance (Feet): 80 Feet Assistive device: Rolling walker (2 wheeled) Gait Pattern/deviations: Decreased step length - left;Decreased stance time - right;Decreased stride length Gait velocity: Decreased   General Gait Details: slow labored unsteady cadence without Albert, required use of RW for safety demonstrating slightly labored cadence without loss of balance and limited mostly due to fatigue with mild SOB   Stairs             Wheelchair Mobility    Modified Rankin (Stroke Patients Only)       Balance Overall balance assessment: Needs assistance Sitting-balance support: Feet supported;No upper extremity supported Sitting balance-Leahy Scale: Good Sitting balance - Comments: seated at EOB   Standing balance support: During functional activity;No upper extremity supported Standing balance-Leahy Scale: Poor Standing balance comment: fair/poor without Albert, fair  using RW                            Cognition Arousal/Alertness:  Awake/alert Behavior During Therapy: Impulsive;WFL for tasks assessed/performed Overall Cognitive Status: No family/caregiver present to determine baseline cognitive functioning                                        Exercises General Exercises - Lower Extremity Long Arc Quad: Seated;AROM;Strengthening;Both;10 reps Hip Flexion/Marching: Seated;AROM;Strengthening;Both;10 reps Toe Raises: Seated;AROM;Strengthening;Both;10 reps Heel Raises: Seated;AROM;Strengthening;Both;10 reps    General Comments        Pertinent Vitals/Pain Pain Assessment: No/denies pain    Home Living                      Prior Function            PT Goals (current goals can now be found in the care plan section) Acute Rehab PT Goals Patient Stated Goal: return home PT Goal Formulation: With patient Time For Goal Achievement: 01/11/21 Potential to Achieve Goals: Good Progress towards PT goals: Progressing toward goals    Frequency    Min 3X/week      PT Plan Current plan remains appropriate    Co-evaluation              AM-PAC PT "6 Clicks" Mobility   Outcome Measure  Help needed turning from your back to your side while in a flat bed without using bedrails?: None Help needed moving from lying on your back to sitting on the side of a flat bed without using bedrails?: None Help needed moving to and from a bed to a chair (including a wheelchair)?: A Little Help needed standing up from a chair using your arms (e.g., wheelchair or bedside chair)?: A Little Help needed to walk in hospital room?: A Little Help needed climbing 3-5 steps with a railing? : A Lot 6 Click Score: 19    End of Session   Activity Tolerance: Patient tolerated treatment well;Patient limited by fatigue Patient left: in chair;with call bell/phone within reach;with chair alarm set Nurse Communication: Mobility status PT Visit Diagnosis: Unsteadiness on feet (R26.81);Other abnormalities of  gait and mobility (R26.89);Muscle weakness (generalized) (M62.81)     Time: 0350-0938 PT Time Calculation (min) (ACUTE ONLY): 20 min  Charges:  $Gait Training: 8-22 mins $Therapeutic Exercise: 8-22 mins                     2:27 PM, 12/29/20 Lonell Grandchild, MPT Physical Therapist with [facility] [phone] office [phone] mobile phone

## 2020-12-30 DIAGNOSIS — N401 Enlarged prostate with lower urinary tract symptoms: Secondary | ICD-10-CM

## 2020-12-30 DIAGNOSIS — R338 Other retention of urine: Secondary | ICD-10-CM

## 2020-12-30 LAB — BASIC METABOLIC PANEL
Anion gap: 8 (ref 5–15)
BUN: 10 mg/dL (ref 8–23)
CO2: 25 mmol/L (ref 22–32)
Calcium: 8.8 mg/dL — ABNORMAL LOW (ref 8.9–10.3)
Chloride: 103 mmol/L (ref 98–111)
Creatinine, Ser: 1.09 mg/dL (ref 0.61–1.24)
GFR, Estimated: >60 mL/min (ref >60)
Glucose, Bld: 188 mg/dL — ABNORMAL HIGH (ref 70–99)
Potassium: 3.9 mmol/L (ref 3.5–5.1)
Sodium: 136 mmol/L (ref 135–145)

## 2020-12-30 LAB — CBC
HCT: 37.4 % — ABNORMAL LOW (ref 39.0–52.0)
Hemoglobin: 12.2 g/dL — ABNORMAL LOW (ref 13.0–17.0)
MCH: 29.9 pg (ref 26.0–34.0)
MCHC: 32.6 g/dL (ref 30.0–36.0)
MCV: 91.7 fL (ref 80.0–100.0)
Platelets: 319 10*3/uL (ref 150–400)
RBC: 4.08 MIL/uL — ABNORMAL LOW (ref 4.22–5.81)
RDW: 13.7 % (ref 11.5–15.5)
WBC: 8 10*3/uL (ref 4.0–10.5)
nRBC: 0 % (ref 0.0–0.2)

## 2020-12-30 LAB — GLUCOSE, CAPILLARY
Glucose-Capillary: 171 mg/dL — ABNORMAL HIGH (ref 70–99)
Glucose-Capillary: 179 mg/dL — ABNORMAL HIGH (ref 70–99)
Glucose-Capillary: 131 mg/dL — ABNORMAL HIGH (ref 70–99)
Glucose-Capillary: 193 mg/dL — ABNORMAL HIGH (ref 70–99)

## 2020-12-30 LAB — MAGNESIUM: Magnesium: 2.1 mg/dL (ref 1.7–2.4)

## 2020-12-30 LAB — CULTURE, BLOOD (ROUTINE X 2): Culture: NO GROWTH

## 2020-12-30 MED ORDER — HALOPERIDOL LACTATE 5 MG/ML IJ SOLN
1.0000 mg | Freq: Four times a day (QID) | INTRAMUSCULAR | Status: DC | PRN
  Administered 2020-12-30 – 2021-01-09 (×7): 2 mg via INTRAVENOUS
  Filled 2020-12-30 (×7): qty 1

## 2020-12-30 NOTE — Progress Notes (Signed)
PROGRESS NOTE    Albert Garrison  DSK:[id number] DOB: 05/23/1945 DOA: 12/25/2020 PCP: Caprice Renshaw, MD   Brief Narrative:  75 y.o. WM PMHx type 2 diabetes, stage III chronic kidney disease, BPH, history of stroke, GERD, history of Cardiac Arrest.  The patient was recently admitted on 11/27/2020 and was discharged approximately 3 days ago from the hospital.  During that hospitalization, the patient went into cardiac arrest and had acute hypoxemic respiratory failure, he was intubated and ROSC was obtained after about 1 minute.    The patient now presents with hematuria.  He has been admitted with some acute metabolic encephalopathy in the setting of catheter associated UTI.  He continues to have some intermittent confusion, but he does not qualify for SNF and therefore is unsafe for discharge.  APS evaluation pending as well as guardianship.  He does have group A strep bacteremia for which she is undergoing treatment with IV Rocephin.  This can be switched to oral cefdinir to complete a total 10-day course on discharge.   Subjective: Afebrile overnight afebrile overnight   Assessment & Plan:  Covid vaccination;  Principal Problem:   Acute lower UTI Active Problems:   Type 2 diabetes mellitus without complication, with long-term current use of insulin (HCC)   Benign prostatic hyperplasia with urinary retention   Hypertension   History of stroke   Acute metabolic encephalopathy versus dementia secondary to CAUTI-improved -Baseline mentation is a little uncertain, but he does not appear safe for discharge to home on his own -TOC assisting with APS involvement and obtaining guardianship -Patient noted to have hematuria on presentation -Continue Rocephin for bacteremia as noted below and urine cultures with no growth -He might have had some foley trauma at SNF according to the brother   Group A strep bacteremia -Plan to treat with 10-day course of antibiotics -Currently on day 5 of  Rocephin -Can switch to oral cefdinir on discharge -No need to evaluate further for endocarditis -Discussed case with Dr. Gale Journey with ID   Type 2 diabetes -DYS 2 diet -SSI   Mild hyponatremia -Continue to monitor   BPH with Foley catheter -Tried voiding trial 9/27, but he continued to have issues with urine retention -Foley catheter replaced on 9/27 and he will need outpatient urology follow-up -Continue Flomax   History of stroke -Continue aspirin -DYS 2 diet   History of NSTEMI -Continue aspirin, Lipitor, beta-blocker    DVT prophylaxis: Lovenox Code Status: Full Family Communication:  Status is: Inpatient    Dispo: The patient is from: Home              Anticipated d/c is to: SNF              Anticipated d/c date is: > 3 days              Patient currently is medically stable to d/c.      Consultants:    Procedures/Significant Events:     I have personally reviewed and interpreted all radiology studies and my findings are as above.  VENTILATOR SETTINGS:    Cultures   Antimicrobials: Anti-infectives (From admission, onward)    Start     Ordered Stop   12/27/20 0000  cefdinir (OMNICEF) 300 MG capsule        12/27/20 1037 01/04/21 2359   12/26/20 1600  cefTRIAXone (ROCEPHIN) 2 g in sodium chloride 0.9 % 100 mL IVPB        12/25/20 2234  12/25/20 1800  cefTRIAXone (ROCEPHIN) 1 g in sodium chloride 0.9 % 100 mL IVPB        12/25/20 1755 12/25/20 1847   12/25/20 1515  cefTRIAXone (ROCEPHIN) 1 g in sodium chloride 0.9 % 100 mL IVPB        12/25/20 1510 12/25/20 1635   12/25/20 0000  cephALEXin (KEFLEX) 500 MG capsule  Status:  Discontinued        12/25/20 1533 12/27/20          Devices    LINES / TUBES:      Continuous Infusions:  cefTRIAXone (ROCEPHIN)  IV 2 g (12/29/20 1652)     Objective: Vitals:   12/29/20 0800 12/29/20 1339 12/29/20 2124 12/30/20 0548  BP: 140/66 (!) 118/57 132/79 (!) 162/85  Pulse: 69 (!) 59 69 65  Resp:   15 19 18   Temp: 97.7 F (36.5 C) 98.6 F (37 C) 98.2 F (36.8 C) 98.2 F (36.8 C)  TempSrc: Oral  Oral   SpO2: 97% 97% 96% 98%  Weight:      Height:        Intake/Output Summary (Last 24 hours) at 12/30/2020 1660 Last data filed at 12/30/2020 0550 Gross per 24 hour  Intake 720 ml  Output 5900 ml  Net -5180 ml   Filed Weights   12/25/20 2058  Weight: 76.5 kg    Examination:  General: No acute respiratory distress Eyes: negative scleral hemorrhage, negative anisocoria, negative icterus ENT: Negative Runny nose, negative gingival bleeding, Neck:  Negative scars, masses, torticollis, lymphadenopathy, JVD Lungs: Clear to auscultation bilaterally without wheezes or crackles Cardiovascular: Regular rate and rhythm without murmur gallop or rub normal S1 and S2 Abdomen: negative abdominal pain, nondistended, positive soft, bowel sounds, no rebound, no ascites, no appreciable mass Extremities: No significant cyanosis, clubbing, or edema bilateral lower extremities Skin: Negative rashes, lesions, ulcers Psychiatric:  Negative depression, negative anxiety, negative fatigue, negative mania  Central nervous system:  Cranial nerves II through XII intact, tongue/uvula midline, all extremities muscle strength 5/5, sensation intact throughout, negative dysarthria, negative expressive aphasia, negative receptive aphasia.  .     Data Reviewed: Care during the described time interval was provided by me .  I have reviewed this patient's available data, including medical history, events of note, physical examination, and all test results as part of my evaluation.   CBC: Recent Labs  Lab 12/25/20 1624 12/26/20 0606 12/27/20 0530 12/30/20 0503  WBC 20.6* 16.6* 8.3 8.0  NEUTROABS 17.2*  --   --   --   HGB 12.3* 11.0* 11.4* 12.2*  HCT 37.3* 33.9* 35.2* 37.4*  MCV 91.0 90.4 91.2 91.7  PLT 302 288 272 630   Basic Metabolic Panel: Recent Labs  Lab 12/25/20 1624 12/26/20 0606  12/27/20 0530 12/30/20 0503  NA 132* 133* 134* 136  K 3.8 3.4* 3.6 3.9  CL 102 102 104 103  CO2 20* 22 23 25   GLUCOSE 229* 176* 147* 188*  BUN 15 13 11 10   CREATININE 1.13 1.02 1.03 1.09  CALCIUM 8.1* 7.9* 8.0* 8.8*  MG  --   --  1.8 2.1   GFR: Estimated Creatinine Clearance: 61.4 mL/min (by C-G formula based on SCr of 1.09 mg/dL). Liver Function Tests: Recent Labs  Lab 12/25/20 1624  AST 22  ALT 18  ALKPHOS 89  BILITOT 0.7  PROT 6.8  ALBUMIN 3.0*   No results for input(s): LIPASE, AMYLASE in the last 168 hours. No results for input(s): AMMONIA  in the last 168 hours. Coagulation Profile: No results for input(s): INR, PROTIME in the last 168 hours. Cardiac Enzymes: No results for input(s): CKTOTAL, CKMB, CKMBINDEX, TROPONINI in the last 168 hours. BNP (last 3 results) No results for input(s): PROBNP in the last 8760 hours. HbA1C: No results for input(s): HGBA1C in the last 72 hours. CBG: Recent Labs  Lab 12/28/20 2128 12/29/20 0732 12/29/20 1128 12/29/20 1609 12/29/20 2135  GLUCAP 191* 185* 189* 142* 236*   Lipid Profile: No results for input(s): CHOL, HDL, LDLCALC, TRIG, CHOLHDL, LDLDIRECT in the last 72 hours. Thyroid Function Tests: No results for input(s): TSH, T4TOTAL, FREET4, T3FREE, THYROIDAB in the last 72 hours. Anemia Panel: No results for input(s): VITAMINB12, FOLATE, FERRITIN, TIBC, IRON, RETICCTPCT in the last 72 hours. Urine analysis:    Component Value Date/Time   COLORURINE BROWN (A) 12/25/2020 1238   APPEARANCEUR HAZY (A) 12/25/2020 1238   LABSPEC 1.015 12/25/2020 1238   PHURINE 5.0 12/25/2020 1238   GLUCOSEU >=500 (A) 12/25/2020 1238   HGBUR LARGE (A) 12/25/2020 1238   BILIRUBINUR NEGATIVE 12/25/2020 1238   KETONESUR NEGATIVE 12/25/2020 1238   PROTEINUR 100 (A) 12/25/2020 1238   UROBILINOGEN 0.2 03/24/2014 1320   NITRITE NEGATIVE 12/25/2020 1238   LEUKOCYTESUR MODERATE (A) 12/25/2020 1238   Sepsis  Labs: @LABRCNTIP (procalcitonin:4,lacticidven:4)  ) Recent Results (from the past 240 hour(s))  Resp Panel by RT-PCR (Flu A&B, Covid) Nasopharyngeal Swab     Status: None   Collection Time: 12/23/20 11:37 AM   Specimen: Nasopharyngeal Swab; Nasopharyngeal(NP) swabs in vial transport medium  Result Value Ref Range Status   SARS Coronavirus 2 by RT PCR NEGATIVE NEGATIVE Final    Comment: (NOTE) SARS-CoV-2 target nucleic acids are NOT DETECTED.  The SARS-CoV-2 RNA is generally detectable in upper respiratory specimens during the acute phase of infection. The lowest concentration of SARS-CoV-2 viral copies this assay can detect is 138 copies/mL. A negative result does not preclude SARS-Cov-2 infection and should not be used as the sole basis for treatment or other patient management decisions. A negative result may occur with  improper specimen collection/handling, submission of specimen other than nasopharyngeal swab, presence of viral mutation(s) within the areas targeted by this assay, and inadequate number of viral copies(<138 copies/mL). A negative result must be combined with clinical observations, patient history, and epidemiological information. The expected result is Negative.  Fact Sheet for Patients:  EntrepreneurPulse.com.au  Fact Sheet for Healthcare Providers:  IncredibleEmployment.be  This test is no t yet approved or cleared by the [place] FDA and  has been authorized for detection and/or diagnosis of SARS-CoV-2 by FDA under an Emergency Use Authorization (EUA). This EUA will remain  in effect (meaning this test can be used) for the duration of the COVID-19 declaration under Section 564(b)(1) of the Act, 21 U.S.C.section 360bbb-3(b)(1), unless the authorization is terminated  or revoked sooner.       Influenza A by PCR NEGATIVE NEGATIVE Final   Influenza B by PCR NEGATIVE NEGATIVE Final    Comment: (NOTE) The Xpert  Xpress SARS-CoV-2/FLU/RSV plus assay is intended as an aid in the diagnosis of influenza from Nasopharyngeal swab specimens and should not be used as a sole basis for treatment. Nasal washings and aspirates are unacceptable for Xpert Xpress SARS-CoV-2/FLU/RSV testing.  Fact Sheet for Patients: EntrepreneurPulse.com.au  Fact Sheet for Healthcare Providers: IncredibleEmployment.be  This test is not yet approved or cleared by the [place] FDA and has been authorized for detection and/or diagnosis of SARS-CoV-2  by FDA under an Emergency Use Authorization (EUA). This EUA will remain in effect (meaning this test can be used) for the duration of the COVID-19 declaration under Section 564(b)(1) of the Act, 21 U.S.C. section 360bbb-3(b)(1), unless the authorization is terminated or revoked.  Performed at [facility], [place] [address], [place], [place] [address]   Urine Culture     Status: Abnormal   Collection Time: 12/25/20  2:31 PM   Specimen: Urine, Catheterized  Result Value Ref Range Status   Specimen Description   Final    URINE, CATHETERIZED Performed at [facility], [address] [place], [place] [address]    Special Requests   Final    NONE Performed at [facility], [address], [place], [place] [address]    Culture (A)  Final    80,000 COLONIES/mL STREPTOCOCCUS PYOGENES Beta hemolytic streptococci are predictably susceptible to penicillin and other beta lactams. Susceptibility testing not routinely performed. Performed at [facility], [place] [address] [place], [place] [address]    Report Status 12/27/2020 FINAL  Final  Culture, blood (Routine X 2) w Reflex to ID Panel     Status: Abnormal   Collection Time: 12/25/20  3:44 PM   Specimen: BLOOD RIGHT FOREARM  Result Value Ref Range Status   Specimen Description   Final    BLOOD RIGHT FOREARM Performed at [facility], [address], [place], [place] [address]     Special Requests   Final    BOTTLES DRAWN AEROBIC AND ANAEROBIC Blood Culture adequate volume Performed at [facility], [address] [place], [place] [address]    Culture  Setup Time   Final    GRAM POSITIVE COCCI IN BOTH AEROBIC AND ANAEROBIC BOTTLES Gram Stain Report Called to,Read Back By and Verified With: WATSON @ 1019 ON [id number] BY HENDERSON L CRITICAL RESULT CALLED TO, READ BACK BY AND VERIFIED WITH: S HURTH PHARMD @1400  12/26/21 EB Performed at [facility], [place] [address] [place], [place] [address]    Culture GROUP A STREP (S.PYOGENES) ISOLATED (A)  Final   Report Status 12/28/2020 FINAL  Final   Organism ID, Bacteria GROUP A STREP (S.PYOGENES) ISOLATED  Final      Susceptibility   Group a strep (s.pyogenes) isolated - MIC*    PENICILLIN <=0.06 SENSITIVE Sensitive     CEFTRIAXONE <=0.12 SENSITIVE Sensitive     ERYTHROMYCIN <=0.12 SENSITIVE Sensitive     LEVOFLOXACIN 0.5 SENSITIVE Sensitive     VANCOMYCIN 0.5 SENSITIVE Sensitive     * GROUP A STREP (S.PYOGENES) ISOLATED  Blood Culture ID Panel (Reflexed)     Status: Abnormal   Collection Time: 12/25/20  3:44 PM  Result Value Ref Range Status   Enterococcus faecalis NOT DETECTED NOT DETECTED Final   Enterococcus Faecium NOT DETECTED NOT DETECTED Final   Listeria monocytogenes NOT DETECTED NOT DETECTED Final   Staphylococcus species NOT DETECTED NOT DETECTED Final   Staphylococcus aureus (BCID) NOT DETECTED NOT DETECTED Final   Staphylococcus epidermidis NOT DETECTED NOT DETECTED Final   Staphylococcus lugdunensis NOT DETECTED NOT DETECTED Final   Streptococcus species DETECTED (A) NOT DETECTED Final    Comment: CRITICAL RESULT CALLED TO, READ BACK BY AND VERIFIED WITH: S HURTH PHARMD @1400  12/26/21 EB    Streptococcus agalactiae NOT DETECTED NOT DETECTED Final   Streptococcus pneumoniae NOT DETECTED NOT DETECTED Final   Streptococcus pyogenes DETECTED (A) NOT DETECTED Final    Comment: CRITICAL RESULT CALLED TO,  READ BACK BY AND VERIFIED WITH: S HURTH PHARMD @1400  12/26/21 EB    A.calcoaceticus-baumannii NOT DETECTED NOT DETECTED Final   Bacteroides fragilis NOT DETECTED NOT DETECTED Final   Enterobacterales NOT DETECTED NOT DETECTED Final   Enterobacter cloacae complex NOT DETECTED NOT DETECTED Final   Escherichia coli NOT DETECTED NOT DETECTED Final   Klebsiella aerogenes NOT DETECTED NOT DETECTED Final   Klebsiella oxytoca NOT DETECTED NOT DETECTED Final   Klebsiella pneumoniae NOT DETECTED NOT DETECTED Final   Proteus species NOT DETECTED NOT DETECTED Final   Salmonella species NOT DETECTED NOT DETECTED Final   Serratia marcescens NOT DETECTED NOT DETECTED Final   Haemophilus influenzae NOT DETECTED NOT DETECTED Final   Neisseria meningitidis NOT DETECTED NOT DETECTED Final   Pseudomonas aeruginosa NOT DETECTED NOT DETECTED Final   Stenotrophomonas maltophilia NOT DETECTED NOT DETECTED Final   Candida albicans NOT DETECTED NOT DETECTED Final   Candida auris NOT DETECTED NOT DETECTED Final   Candida glabrata NOT DETECTED NOT DETECTED Final   Candida krusei NOT DETECTED NOT DETECTED Final   Candida parapsilosis NOT DETECTED NOT DETECTED Final   Candida tropicalis NOT DETECTED NOT DETECTED Final   Cryptococcus neoformans/gattii NOT DETECTED NOT DETECTED Final    Comment: Performed at [facility], [place]. [address], [place], [place] [address]  Resp Panel by RT-PCR (Flu A&B, Covid) Nasopharyngeal Swab     Status: None   Collection Time: 12/25/20  4:15 PM   Specimen: Nasopharyngeal Swab; Nasopharyngeal(NP) swabs in vial transport medium  Result Value Ref Range Status   SARS Coronavirus 2 by RT PCR NEGATIVE NEGATIVE Final    Comment: (NOTE) SARS-CoV-2 target nucleic acids are NOT DETECTED.  The SARS-CoV-2 RNA is generally detectable in upper respiratory specimens during the acute phase of infection. The lowest concentration of SARS-CoV-2 viral copies this assay can detect is 138  copies/mL. A negative result does not preclude SARS-Cov-2 infection and should not be used as the sole basis for treatment or other patient management decisions. A negative result may occur with  improper specimen collection/handling, submission of specimen other than nasopharyngeal swab, presence of viral mutation(s) within the areas targeted by this assay, and inadequate number of viral copies(<138 copies/mL). A negative result must be combined with clinical observations, patient history, and epidemiological information. The expected result is Negative.  Fact Sheet for Patients:  EntrepreneurPulse.com.au  Fact Sheet for Healthcare Providers:  IncredibleEmployment.be  This test is no t yet approved or cleared by the [place] FDA and  has been authorized for detection and/or diagnosis of SARS-CoV-2 by FDA under an Emergency Use Authorization (EUA). This EUA will remain  in effect (meaning this test can be used) for the duration of the COVID-19 declaration under Section 564(b)(1) of the Act, 21 U.S.C.section 360bbb-3(b)(1), unless the authorization is terminated  or revoked sooner.       Influenza A by PCR NEGATIVE NEGATIVE Final   Influenza B by PCR NEGATIVE NEGATIVE Final    Comment: (NOTE) The Xpert Xpress SARS-CoV-2/FLU/RSV plus assay is intended as an aid in the diagnosis of influenza from Nasopharyngeal swab specimens and should not be used as a sole basis for treatment. Nasal washings and aspirates are unacceptable for Xpert Xpress SARS-CoV-2/FLU/RSV testing.  Fact Sheet for Patients: EntrepreneurPulse.com.au  Fact Sheet for Healthcare Providers: IncredibleEmployment.be  This test is not yet approved or cleared by the [place] FDA and has been authorized for detection and/or diagnosis of SARS-CoV-2 by FDA under an Emergency Use  Authorization (EUA). This EUA will remain in effect (meaning  this test can be used) for the duration of the COVID-19 declaration under Section 564(b)(1) of the Act, 21 U.S.C. section 360bbb-3(b)(1), unless the authorization is terminated or revoked.  Performed at [facility], [address] [place], [place] [address]   Culture, blood (Routine X 2) w Reflex to ID Panel     Status: None (Preliminary result)   Collection Time: 12/25/20  4:24 PM   Specimen: BLOOD LEFT HAND  Result Value Ref Range Status   Specimen Description BLOOD LEFT HAND  Final   Special Requests   Final    BOTTLES DRAWN AEROBIC AND ANAEROBIC Blood Culture results may not be optimal due to an excessive volume of blood received in culture bottles   Culture   Final    NO GROWTH 4 DAYS Performed at [facility], [address] [place],  [address]    Report Status PENDING  Incomplete         Radiology Studies: No results found.      Scheduled Meds:  amLODipine  10 mg Oral Daily   aspirin  81 mg Oral Daily   atorvastatin  40 mg Oral q1800   Chlorhexidine Gluconate Cloth  6 each Topical Daily   enoxaparin (LOVENOX) injection  40 mg Subcutaneous Q24H   haloperidol  0.5 mg Oral BID   insulin aspart  0-15 Units Subcutaneous TID WC   metoprolol tartrate  25 mg Oral BID   tamsulosin  0.4 mg Oral QPC supper   Continuous Infusions:  cefTRIAXone (ROCEPHIN)  IV 2 g (12/29/20 1652)     LOS: 5 days   The patient is critically ill with multiple organ systems failure and requires high complexity decision making for assessment and support, frequent evaluation and titration of therapies, application of advanced monitoring technologies and extensive interpretation of multiple databases. Critical Care Time devoted to patient care services described in this note  Time spent: 40 minutes     Wonda Goodgame, Geraldo Docker, MD [facility]   If 7PM-7AM, please contact night-coverage 12/30/2020, 8:32 AM

## 2020-12-30 NOTE — TOC Progression Note (Signed)
Transition of Care [facility]) - Progression Note    Patient Details  Name: ARIEZ NEILAN MRN: [id number] Date of Birth: 1945/06/07  [facility] CM/SW Contact  Ihor Gully, LCSW Phone Number: 12/30/2020, 11:27 AM  Clinical Narrative:    At the request of [place] with [facility] APS, referral sent to [facility].      Barriers to Discharge: Unsafe home situation  Expected Discharge Plan and Services   In-house Referral: Clinical Social Work       Expected Discharge Date: 12/27/20                                     Social Determinants of Health (SDOH) Interventions    Readmission Risk Interventions No flowsheet data found.

## 2020-12-30 NOTE — Progress Notes (Signed)
APS caseworker Colletta [place] requesting scan of pts POA paperwork. CSW to send scan over via secure email.

## 2020-12-30 NOTE — NC FL2 (Signed)
[place] LEVEL OF CARE SCREENING TOOL     IDENTIFICATION  Patient Name: Albert Garrison Birthdate: 04/03/1946 Sex: male Admission Date (Current Location): 12/25/2020  [facility] and [place] Number:  [employer] and Address:  [place] [address], [place]      Provider Number: [id number]  Attending Physician Name and Address:  Allie Bossier, MD  Relative Name and Phone Number:  Ilan, Kahrs (Brother)   [phone]    Current Level of Care: Hospital Recommended Level of Care: [place] Prior Approval Number:    Date Approved/Denied:   PASRR Number: [id number]  Discharge Plan: Domiciliary (Rest home)    Current Diagnoses: Patient Active Problem List   Diagnosis Date Noted   Acute lower UTI 12/25/2020   Dysphagia 12/22/2020   Acute hypoxemic respiratory failure ([place]) 11/30/2020   Shock circulatory ([place]) 11/30/2020   Cardiac arrest ([place]) 11/30/2020   Other specified cardiac arrhythmias--- sleep apnea- arrhythmias when he desaturates  11/30/2020   Endotracheally intubated 11/30/2020   SBO (small bowel obstruction) ([place]) 11/28/2020   History of stroke 08/24/2017   Altered mental status 08/23/2017   Sleep apnea 08/23/2017   Elevated troponin 08/23/2017   Abnormal CT of the head 08/23/2017   Hypertension 08/23/2017   Type 2 diabetes mellitus ([place]) 08/23/2017   Syncope 08/23/2017   S/P carpal tunnel release right 07/13/17 07/27/2017   AKI (acute kidney injury) ([place]) 11/29/2015   Lower GI bleed 11/28/2015   Rectal bleed 11/28/2015   Rectal bleeding 11/28/2015   Benign prostatic hyperplasia with urinary retention 01/20/2015   Urinary retention 05/21/2014   ARF (acute renal failure) ([place]) 03/26/2014   Type 2 diabetes mellitus without complication, with long-term current use of insulin ([place]) 03/26/2014   UTI (lower urinary tract infection) 03/24/2014   Hyponatremia 03/24/2014   Acute encephalopathy  03/24/2014    Orientation RESPIRATION BLADDER Height & Weight     Self, Time, Situation, Place  O2 (2L) Incontinent Weight: 168 lb 10.4 oz (76.5 kg) Height:  5\' 11"  (180.3 cm)  BEHAVIORAL SYMPTOMS/MOOD NEUROLOGICAL BOWEL NUTRITION STATUS      Continent Diet (DIET DYS 2 Room service appropriate? Yes; Fluid consistency: Thin)  AMBULATORY STATUS COMMUNICATION OF NEEDS Skin   Limited Assist Verbally Other (Comment) (Rash Left Groin)                       Personal Care Assistance Level of Assistance    Bathing Assistance: Limited assistance Feeding assistance: Independent Dressing Assistance: Limited assistance     Functional Limitations Info  Sight, Hearing, Speech Sight Info: Adequate Hearing Info: Impaired Speech Info: Adequate    SPECIAL CARE FACTORS FREQUENCY  PT (By licensed PT), OT (By licensed OT)     PT Frequency: 3x/week OT Frequency: 3x/week            Contractures Contractures Info: Not present    Additional Factors Info  Code Status, Allergies, Insulin Sliding Scale Code Status Info: Full Allergies Info: n/a   Insulin Sliding Scale Info: CBG 70 - 120: 0 units    CBG 121 - 150: 2 units    CBG 151 - 200: 3 units    CBG 201 - 250: 5 units    CBG 251 - 300: 8 units    CBG 301 - 350: 11 units    CBG 351 - 400: 15 units       Current Medications (12/30/2020):  This is  the current hospital active medication list Current Facility-Administered Medications  Medication Dose Route Frequency Provider Last Rate Last Admin   acetaminophen (TYLENOL) tablet 650 mg  650 mg Oral Q6H PRN Adefeso, Oladapo, DO   650 mg at 12/27/20 0123   amLODipine (NORVASC) tablet 10 mg  10 mg Oral Daily Truett Mainland, DO   10 mg at 12/30/20 6295   aspirin chewable tablet 81 mg  81 mg Oral Daily Truett Mainland, DO   81 mg at 12/30/20 2841   atorvastatin (LIPITOR) tablet 40 mg  40 mg Oral L2440 Truett Mainland, DO   40 mg at 12/29/20 1656   cefTRIAXone (ROCEPHIN) 2 g in sodium  chloride 0.9 % 100 mL IVPB  2 g Intravenous Q24H Truett Mainland, DO 200 mL/hr at 12/29/20 1652 2 g at 12/29/20 1652   Chlorhexidine Gluconate Cloth 2 % PADS 6 each  6 each Topical Daily Truett Mainland, DO   6 each at 12/30/20 0827   enoxaparin (LOVENOX) injection 40 mg  40 mg Subcutaneous Q24H Manuella Ghazi, Pratik D, DO   40 mg at 12/29/20 1657   haloperidol (HALDOL) tablet 0.5 mg  0.5 mg Oral BID Heath Lark D, DO   0.5 mg at 12/30/20 1027   insulin aspart (novoLOG) injection 0-15 Units  0-15 Units Subcutaneous TID WC Truett Mainland, DO   3 Units at 12/30/20 2536   metoprolol tartrate (LOPRESSOR) tablet 25 mg  25 mg Oral BID Truett Mainland, DO   25 mg at 12/30/20 0826   ondansetron (ZOFRAN) tablet 4 mg  4 mg Oral Q6H PRN Truett Mainland, DO       Or   ondansetron [facility]) injection 4 mg  4 mg Intravenous Q6H PRN Truett Mainland, DO       phenol (CHLORASEPTIC) mouth spray 1 spray  1 spray Mouth/Throat PRN Manuella Ghazi, Pratik D, DO       tamsulosin (FLOMAX) capsule 0.4 mg  0.4 mg Oral QPC supper Truett Mainland, DO   0.4 mg at 12/29/20 1657     Discharge Medications: Please see discharge summary for a list of discharge medications.  Relevant Imaging Results:  Relevant Lab Results:   Additional Information Pt SSN [id number]  Ihor Gully, LCSW

## 2020-12-31 DIAGNOSIS — N390 Urinary tract infection, site not specified: Secondary | ICD-10-CM | POA: Diagnosis not present

## 2020-12-31 LAB — COMPREHENSIVE METABOLIC PANEL
ALT: 34 U/L (ref 0–44)
AST: 25 U/L (ref 15–41)
Albumin: 3.3 g/dL — ABNORMAL LOW (ref 3.5–5.0)
Alkaline Phosphatase: 86 U/L (ref 38–126)
Anion gap: 10 (ref 5–15)
BUN: 10 mg/dL (ref 8–23)
CO2: 24 mmol/L (ref 22–32)
Calcium: 8.8 mg/dL — ABNORMAL LOW (ref 8.9–10.3)
Chloride: 99 mmol/L (ref 98–111)
Creatinine, Ser: 1.11 mg/dL (ref 0.61–1.24)
GFR, Estimated: >60 mL/min (ref >60)
Glucose, Bld: 189 mg/dL — ABNORMAL HIGH (ref 70–99)
Potassium: 3.5 mmol/L (ref 3.5–5.1)
Sodium: 133 mmol/L — ABNORMAL LOW (ref 135–145)
Total Bilirubin: 0.6 mg/dL (ref 0.3–1.2)
Total Protein: 7.1 g/dL (ref 6.5–8.1)

## 2020-12-31 LAB — CBC WITH DIFFERENTIAL/PLATELET
Abs Immature Granulocytes: 0.05 10*3/uL (ref 0.00–0.07)
Basophils Absolute: 0.1 10*3/uL (ref 0.0–0.1)
Basophils Relative: 1 %
Eosinophils Absolute: 0.1 10*3/uL (ref 0.0–0.5)
Eosinophils Relative: 1 %
HCT: 38.8 % — ABNORMAL LOW (ref 39.0–52.0)
Hemoglobin: 12.4 g/dL — ABNORMAL LOW (ref 13.0–17.0)
Immature Granulocytes: 1 %
Lymphocytes Relative: 35 %
Lymphs Abs: 3 10*3/uL (ref 0.7–4.0)
MCH: 28.8 pg (ref 26.0–34.0)
MCHC: 32 g/dL (ref 30.0–36.0)
MCV: 90.2 fL (ref 80.0–100.0)
Monocytes Absolute: 0.8 10*3/uL (ref 0.1–1.0)
Monocytes Relative: 9 %
Neutro Abs: 4.7 10*3/uL (ref 1.7–7.7)
Neutrophils Relative %: 53 %
Platelets: 342 10*3/uL (ref 150–400)
RBC: 4.3 MIL/uL (ref 4.22–5.81)
RDW: 13.7 % (ref 11.5–15.5)
WBC: 8.7 10*3/uL (ref 4.0–10.5)
nRBC: 0 % (ref 0.0–0.2)

## 2020-12-31 LAB — GLUCOSE, CAPILLARY
Glucose-Capillary: 198 mg/dL — ABNORMAL HIGH (ref 70–99)
Glucose-Capillary: 135 mg/dL — ABNORMAL HIGH (ref 70–99)
Glucose-Capillary: 201 mg/dL — ABNORMAL HIGH (ref 70–99)
Glucose-Capillary: 231 mg/dL — ABNORMAL HIGH (ref 70–99)

## 2020-12-31 LAB — MAGNESIUM: Magnesium: 2 mg/dL (ref 1.7–2.4)

## 2020-12-31 LAB — PHOSPHORUS: Phosphorus: 3.4 mg/dL (ref 2.5–4.6)

## 2020-12-31 NOTE — Care Management Important Message (Signed)
Important Message  Patient Details  Name: Albert Garrison MRN: [id number] Date of Birth: Mar 20, 1946   Medicare Important Message Given:  Yes     [name] 12/31/2020, 1:42 PM

## 2020-12-31 NOTE — Progress Notes (Signed)
PROGRESS NOTE    Albert Garrison  BJS:[id number] DOB: 1945-06-01 DOA: 12/25/2020 PCP: Caprice Renshaw, MD   Brief Narrative:  75 y.o. WM PMHx type 2 diabetes, stage III chronic kidney disease, BPH, history of stroke, GERD, history of Cardiac Arrest.  The patient was recently admitted on 11/27/2020 and was discharged approximately 3 days priot to Re-admission.  During that hospitalization, the patient went into cardiac arrest and had acute hypoxemic respiratory failure, he was intubated and ROSC was obtained after about 1 minute.    The patient now presents with hematuria.  He has been admitted with some acute metabolic encephalopathy in the setting of catheter associated UTI.  He continues to have some intermittent confusion, but he does not qualify for SNF and therefore is unsafe for discharge.  APS/DSS evaluation pending as well as guardianship.  He does have group A strep bacteremia for which she is undergoing treatment with IV Rocephin.  This can be switched to oral cefdinir to complete a total 10-day course on discharge.   Subjective: Afebrile overnight afebrile overnight   Assessment & Plan:  Covid vaccination;  Principal Problem:   Acute lower UTI Active Problems:   Type 2 diabetes mellitus without complication, with long-term current use of insulin (HCC)   Benign prostatic hyperplasia with urinary retention   Hypertension   History of stroke   Acute metabolic encephalopathy versus dementia secondary to CAUTI-improved -Baseline mentation is a little uncertain, but he does Not appear safe for discharge to home on his own -TOC assisting with APS/DSS involvement and obtaining guardianship -Patient noted to have hematuria on presentation -Continue Rocephin for Strep Pyogenes bacteremia as noted below  -Urine cultures with no growth -He might have had some foley trauma at SNF according to the brother   Group A strep bacteremia -Blood cultures from 12/25/2020 with strep  pyogenes -Continue IV Rocephin for total of 10 days if discharged prior to completing IV Rocephin, May switch to [facility] --No need to evaluate further for endocarditis -Discussed case with Dr. Gale Journey with ID -If repeat cultures are positive please reconsult ID   Type 2 diabetes -DYS 2 diet -SSI   Mild hyponatremia -Continue to monitor   BPH with Foley catheter -Tried voiding trial 12/29/20, but he continued to have issues with urine retention -Foley catheter replaced on 9/27 and he will need outpatient urology follow-up -Continue Flomax   History of stroke -Continue aspirin -DYS 2 diet   History of NSTEMI -Continue aspirin, Lipitor, beta-blocker     DVT prophylaxis: Lovenox Code Status: Full Family Communication:  Status is: Inpatient    Dispo: The patient is from: Home              Anticipated d/c is to: SNF--- awaiting guardianship decisions and hopefully transfer to SNF.              Anticipated d/c date is: > 3 days              Patient currently is medically stable to d/c.    Consultants: Phone consult with Dr. Gale Journey from infectious disease   Cultures=== blood cultures from 12/25/2020 with strep pyogenes   Antimicrobials: Anti-infectives (From admission, onward)    Start     Ordered Stop   12/27/20 0000  cefdinir (OMNICEF) 300 MG capsule        12/27/20 1037 01/04/21 2359   12/26/20 1600  cefTRIAXone (ROCEPHIN) 2 g in sodium chloride 0.9 % 100 mL IVPB  12/25/20 2234     12/25/20 1800  cefTRIAXone (ROCEPHIN) 1 g in sodium chloride 0.9 % 100 mL IVPB        12/25/20 1755 12/25/20 1847   12/25/20 1515  cefTRIAXone (ROCEPHIN) 1 g in sodium chloride 0.9 % 100 mL IVPB        12/25/20 1510 12/25/20 1635   12/25/20 0000  cephALEXin (KEFLEX) 500 MG capsule  Status:  Discontinued        12/25/20 1533 12/27/20        Continuous Infusions:  cefTRIAXone (ROCEPHIN)  IV 2 g (12/31/20 1501)     Objective: Vitals:   12/30/20 1400 12/30/20 2101 12/31/20 0818  12/31/20 1439  BP: (!) 158/79 (!) 120/110 120/72 121/71  Pulse: 66 70 78 72  Resp: 18 18  18   Temp: 98.6 F (37 C) 98.2 F (36.8 C)  98.6 F (37 C)  TempSrc: Oral Oral  Oral  SpO2: 98% 94% 96% 97%  Weight:      Height:        Intake/Output Summary (Last 24 hours) at 12/31/2020 1612 Last data filed at 12/31/2020 1300 Gross per 24 hour  Intake 960 ml  Output 4600 ml  Net -3640 ml   Filed Weights   12/25/20 2058  Weight: 76.5 kg    Examination:  General: No acute respiratory distress Eyes: negative scleral hemorrhage, negative anisocoria, negative icterus ENT: Negative Runny nose, negative gingival bleeding, Neck:  Negative scars, masses, torticollis, lymphadenopathy, JVD Lungs: Clear to auscultation bilaterally without wheezes or crackles Cardiovascular: Regular rate and rhythm without murmur gallop or rub normal S1 and S2 Abdomen: negative abdominal pain, nondistended, positive soft, bowel sounds, no rebound, no ascites, no appreciable mass Extremities: No significant cyanosis, clubbing, or edema bilateral lower extremities Skin: Negative rashes, lesions, ulcers Psychiatric: Baseline cognitive and memory deficits, cooperative Central nervous system: Generalized weakness without new focal deficits  CBC: Recent Labs  Lab 12/25/20 1624 12/26/20 0606 12/27/20 0530 12/30/20 0503 12/31/20 0511  WBC 20.6* 16.6* 8.3 8.0 8.7  NEUTROABS 17.2*  --   --   --  4.7  HGB 12.3* 11.0* 11.4* 12.2* 12.4*  HCT 37.3* 33.9* 35.2* 37.4* 38.8*  MCV 91.0 90.4 91.2 91.7 90.2  PLT 302 288 272 319 919   Basic Metabolic Panel: Recent Labs  Lab 12/25/20 1624 12/26/20 0606 12/27/20 0530 12/30/20 0503 12/31/20 0511  NA 132* 133* 134* 136 133*  K 3.8 3.4* 3.6 3.9 3.5  CL 102 102 104 103 99  CO2 20* 22 23 25 24   GLUCOSE 229* 176* 147* 188* 189*  BUN 15 13 11 10 10   CREATININE 1.13 1.02 1.03 1.09 1.11  CALCIUM 8.1* 7.9* 8.0* 8.8* 8.8*  MG  --   --  1.8 2.1 2.0  PHOS  --   --   --    --  3.4   GFR: Estimated Creatinine Clearance: 60.3 mL/min (by C-G formula based on SCr of 1.11 mg/dL). Liver Function Tests: Recent Labs  Lab 12/25/20 1624 12/31/20 0511  AST 22 25  ALT 18 34  ALKPHOS 89 86  BILITOT 0.7 0.6  PROT 6.8 7.1  ALBUMIN 3.0* 3.3*   No results for input(s): LIPASE, AMYLASE in the last 168 hours. No results for input(s): AMMONIA in the last 168 hours. Coagulation Profile: No results for input(s): INR, PROTIME in the last 168 hours. Cardiac Enzymes: No results for input(s): CKTOTAL, CKMB, CKMBINDEX, TROPONINI in the last 168 hours. BNP (last 3 results) No  results for input(s): PROBNP in the last 8760 hours. HbA1C: No results for input(s): HGBA1C in the last 72 hours. CBG: Recent Labs  Lab 12/30/20 1108 12/30/20 1615 12/30/20 2047 12/31/20 0747 12/31/20 1127  GLUCAP 179* 131* 193* 198* 135*   Lipid Profile: No results for input(s): CHOL, HDL, LDLCALC, TRIG, CHOLHDL, LDLDIRECT in the last 72 hours. Thyroid Function Tests: No results for input(s): TSH, T4TOTAL, FREET4, T3FREE, THYROIDAB in the last 72 hours. Anemia Panel: No results for input(s): VITAMINB12, FOLATE, FERRITIN, TIBC, IRON, RETICCTPCT in the last 72 hours. Urine analysis:    Component Value Date/Time   COLORURINE BROWN (A) 12/25/2020 1238   APPEARANCEUR HAZY (A) 12/25/2020 1238   LABSPEC 1.015 12/25/2020 1238   PHURINE 5.0 12/25/2020 1238   GLUCOSEU >=500 (A) 12/25/2020 1238   HGBUR LARGE (A) 12/25/2020 1238   BILIRUBINUR NEGATIVE 12/25/2020 1238   KETONESUR NEGATIVE 12/25/2020 1238   PROTEINUR 100 (A) 12/25/2020 1238   UROBILINOGEN 0.2 03/24/2014 1320   NITRITE NEGATIVE 12/25/2020 1238   LEUKOCYTESUR MODERATE (A) 12/25/2020 1238   Sepsis Labs: @LABRCNTIP (procalcitonin:4,lacticidven:4)  ) Recent Results (from the past 240 hour(s))  Resp Panel by RT-PCR (Flu A&B, Covid) Nasopharyngeal Swab     Status: None   Collection Time: 12/23/20 11:37 AM   Specimen:  Nasopharyngeal Swab; Nasopharyngeal(NP) swabs in vial transport medium  Result Value Ref Range Status   SARS Coronavirus 2 by RT PCR NEGATIVE NEGATIVE Final    Comment: (NOTE) SARS-CoV-2 target nucleic acids are NOT DETECTED.  The SARS-CoV-2 RNA is generally detectable in upper respiratory specimens during the acute phase of infection. The lowest concentration of SARS-CoV-2 viral copies this assay can detect is 138 copies/mL. A negative result does not preclude SARS-Cov-2 infection and should not be used as the sole basis for treatment or other patient management decisions. A negative result may occur with  improper specimen collection/handling, submission of specimen other than nasopharyngeal swab, presence of viral mutation(s) within the areas targeted by this assay, and inadequate number of viral copies(<138 copies/mL). A negative result must be combined with clinical observations, patient history, and epidemiological information. The expected result is Negative.  Fact Sheet for Patients:  EntrepreneurPulse.com.au  Fact Sheet for Healthcare Providers:  IncredibleEmployment.be  This test is no t yet approved or cleared by the [place] FDA and  has been authorized for detection and/or diagnosis of SARS-CoV-2 by FDA under an Emergency Use Authorization (EUA). This EUA will remain  in effect (meaning this test can be used) for the duration of the COVID-19 declaration under Section 564(b)(1) of the Act, 21 U.S.C.section 360bbb-3(b)(1), unless the authorization is terminated  or revoked sooner.       Influenza A by PCR NEGATIVE NEGATIVE Final   Influenza B by PCR NEGATIVE NEGATIVE Final    Comment: (NOTE) The Xpert Xpress SARS-CoV-2/FLU/RSV plus assay is intended as an aid in the diagnosis of influenza from Nasopharyngeal swab specimens and should not be used as a sole basis for treatment. Nasal washings and aspirates are unacceptable for  Xpert Xpress SARS-CoV-2/FLU/RSV testing.  Fact Sheet for Patients: EntrepreneurPulse.com.au  Fact Sheet for Healthcare Providers: IncredibleEmployment.be  This test is not yet approved or cleared by the [place] FDA and has been authorized for detection and/or diagnosis of SARS-CoV-2 by FDA under an Emergency Use Authorization (EUA). This EUA will remain in effect (meaning this test can be used) for the duration of the COVID-19 declaration under Section 564(b)(1) of the Act, 21 U.S.C. section 360bbb-3(b)(1), unless  the authorization is terminated or revoked.  Performed at [facility], [place] [address] [place], [place] [address]   Urine Culture     Status: Abnormal   Collection Time: 12/25/20  2:31 PM   Specimen: Urine, Catheterized  Result Value Ref Range Status   Specimen Description   Final    URINE, CATHETERIZED Performed at [facility], [address], [place], [place] [address]    Special Requests   Final    NONE Performed at [facility], [address] [place], [place] [address]    Culture (A)  Final    80,000 COLONIES/mL STREPTOCOCCUS PYOGENES Beta hemolytic streptococci are predictably susceptible to penicillin and other beta lactams. Susceptibility testing not routinely performed. Performed at [facility], [place] [address], [place], [place] [address]    Report Status 12/27/2020 FINAL  Final  Culture, blood (Routine X 2) w Reflex to ID Panel     Status: Abnormal   Collection Time: 12/25/20  3:44 PM   Specimen: BLOOD RIGHT FOREARM  Result Value Ref Range Status   Specimen Description   Final    BLOOD RIGHT FOREARM Performed at [facility], [address] [place], [place] [address]    Special Requests   Final    BOTTLES DRAWN AEROBIC AND ANAEROBIC Blood Culture adequate volume Performed at [facility], [address] [place],  [address]    Culture  Setup Time   Final    GRAM POSITIVE COCCI IN BOTH  AEROBIC AND ANAEROBIC BOTTLES Gram Stain Report Called to,Read Back By and Verified With: WATSON @ 1019 ON [id number] BY HENDERSON L CRITICAL RESULT CALLED TO, READ BACK BY AND VERIFIED WITH: S HURTH PHARMD @1400  12/26/21 EB Performed at [facility], [place] [address], [place], [place] [address]    Culture GROUP A STREP (S.PYOGENES) ISOLATED (A)  Final   Report Status 12/28/2020 FINAL  Final   Organism ID, Bacteria GROUP A STREP (S.PYOGENES) ISOLATED  Final      Susceptibility   Group a strep (s.pyogenes) isolated - MIC*    PENICILLIN <=0.06 SENSITIVE Sensitive     CEFTRIAXONE <=0.12 SENSITIVE Sensitive     ERYTHROMYCIN <=0.12 SENSITIVE Sensitive     LEVOFLOXACIN 0.5 SENSITIVE Sensitive     VANCOMYCIN 0.5 SENSITIVE Sensitive     * GROUP A STREP (S.PYOGENES) ISOLATED  Blood Culture ID Panel (Reflexed)     Status: Abnormal   Collection Time: 12/25/20  3:44 PM  Result Value Ref Range Status   Enterococcus faecalis NOT DETECTED NOT DETECTED Final   Enterococcus Faecium NOT DETECTED NOT DETECTED Final   Listeria monocytogenes NOT DETECTED NOT DETECTED Final   Staphylococcus species NOT DETECTED NOT DETECTED Final   Staphylococcus aureus (BCID) NOT DETECTED NOT DETECTED Final   Staphylococcus epidermidis NOT DETECTED NOT DETECTED Final   Staphylococcus lugdunensis NOT DETECTED NOT DETECTED Final   Streptococcus species DETECTED (A) NOT DETECTED Final    Comment: CRITICAL RESULT CALLED TO, READ BACK BY AND VERIFIED WITH: S HURTH PHARMD @1400  12/26/21 EB    Streptococcus agalactiae NOT DETECTED NOT DETECTED Final   Streptococcus pneumoniae NOT DETECTED NOT DETECTED Final   Streptococcus pyogenes DETECTED (A) NOT DETECTED Final    Comment: CRITICAL RESULT CALLED TO, READ BACK BY AND VERIFIED WITH: S HURTH PHARMD @1400  12/26/21 EB    A.calcoaceticus-baumannii NOT DETECTED NOT DETECTED Final   Bacteroides fragilis NOT DETECTED NOT DETECTED Final   Enterobacterales NOT DETECTED NOT DETECTED  Final  Enterobacter cloacae complex NOT DETECTED NOT DETECTED Final   Escherichia coli NOT DETECTED NOT DETECTED Final   Klebsiella aerogenes NOT DETECTED NOT DETECTED Final   Klebsiella oxytoca NOT DETECTED NOT DETECTED Final   Klebsiella pneumoniae NOT DETECTED NOT DETECTED Final   Proteus species NOT DETECTED NOT DETECTED Final   Salmonella species NOT DETECTED NOT DETECTED Final   Serratia marcescens NOT DETECTED NOT DETECTED Final   Haemophilus influenzae NOT DETECTED NOT DETECTED Final   Neisseria meningitidis NOT DETECTED NOT DETECTED Final   Pseudomonas aeruginosa NOT DETECTED NOT DETECTED Final   Stenotrophomonas maltophilia NOT DETECTED NOT DETECTED Final   Candida albicans NOT DETECTED NOT DETECTED Final   Candida auris NOT DETECTED NOT DETECTED Final   Candida glabrata NOT DETECTED NOT DETECTED Final   Candida krusei NOT DETECTED NOT DETECTED Final   Candida parapsilosis NOT DETECTED NOT DETECTED Final   Candida tropicalis NOT DETECTED NOT DETECTED Final   Cryptococcus neoformans/gattii NOT DETECTED NOT DETECTED Final    Comment: Performed at [facility], [place] [address] [place], [place] [address]  Resp Panel by RT-PCR (Flu A&B, Covid) Nasopharyngeal Swab     Status: None   Collection Time: 12/25/20  4:15 PM   Specimen: Nasopharyngeal Swab; Nasopharyngeal(NP) swabs in vial transport medium  Result Value Ref Range Status   SARS Coronavirus 2 by RT PCR NEGATIVE NEGATIVE Final    Comment: (NOTE) SARS-CoV-2 target nucleic acids are NOT DETECTED.  The SARS-CoV-2 RNA is generally detectable in upper respiratory specimens during the acute phase of infection. The lowest concentration of SARS-CoV-2 viral copies this assay can detect is 138 copies/mL. A negative result does not preclude SARS-Cov-2 infection and should not be used as the sole basis for treatment or other patient management decisions. A negative result may occur with  improper specimen collection/handling,  submission of specimen other than nasopharyngeal swab, presence of viral mutation(s) within the areas targeted by this assay, and inadequate number of viral copies(<138 copies/mL). A negative result must be combined with clinical observations, patient history, and epidemiological information. The expected result is Negative.  Fact Sheet for Patients:  EntrepreneurPulse.com.au  Fact Sheet for Healthcare Providers:  IncredibleEmployment.be  This test is no t yet approved or cleared by the [place] FDA and  has been authorized for detection and/or diagnosis of SARS-CoV-2 by FDA under an Emergency Use Authorization (EUA). This EUA will remain  in effect (meaning this test can be used) for the duration of the COVID-19 declaration under Section 564(b)(1) of the Act, 21 U.S.C.section 360bbb-3(b)(1), unless the authorization is terminated  or revoked sooner.       Influenza A by PCR NEGATIVE NEGATIVE Final   Influenza B by PCR NEGATIVE NEGATIVE Final    Comment: (NOTE) The Xpert Xpress SARS-CoV-2/FLU/RSV plus assay is intended as an aid in the diagnosis of influenza from Nasopharyngeal swab specimens and should not be used as a sole basis for treatment. Nasal washings and aspirates are unacceptable for Xpert Xpress SARS-CoV-2/FLU/RSV testing.  Fact Sheet for Patients: EntrepreneurPulse.com.au  Fact Sheet for Healthcare Providers: IncredibleEmployment.be  This test is not yet approved or cleared by the [place] FDA and has been authorized for detection and/or diagnosis of SARS-CoV-2 by FDA under an Emergency Use Authorization (EUA). This EUA will remain in effect (meaning this test can be used) for the duration of the COVID-19 declaration under Section 564(b)(1) of the Act, 21 U.S.C. section 360bbb-3(b)(1), unless the authorization is terminated or revoked.  Performed  at [facility], [address], [place], [place] [address]   Culture, blood (Routine X 2) w Reflex to ID Panel     Status: None   Collection Time: 12/25/20  4:24 PM   Specimen: BLOOD LEFT HAND  Result Value Ref Range Status   Specimen Description BLOOD LEFT HAND  Final   Special Requests   Final    BOTTLES DRAWN AEROBIC AND ANAEROBIC Blood Culture results may not be optimal due to an excessive volume of blood received in culture bottles   Culture   Final    NO GROWTH 5 DAYS Performed at [facility], [address], [place], [place] [address]    Report Status 12/30/2020 FINAL  Final         Radiology Studies: No results found.      Scheduled Meds:  amLODipine  10 mg Oral Daily   aspirin  81 mg Oral Daily   atorvastatin  40 mg Oral q1800   Chlorhexidine Gluconate Cloth  6 each Topical Daily   enoxaparin (LOVENOX) injection  40 mg Subcutaneous Q24H   haloperidol  0.5 mg Oral BID   insulin aspart  0-15 Units Subcutaneous TID WC   metoprolol tartrate  25 mg Oral BID   tamsulosin  0.4 mg Oral QPC supper   Continuous Infusions:  cefTRIAXone (ROCEPHIN)  IV 2 g (12/31/20 1501)     LOS: 6 days      Roxan Hockey, MD [facility]   If 7PM-7AM, please contact night-coverage 12/31/2020, 4:12 PM

## 2020-12-31 NOTE — Progress Notes (Signed)
Physical Therapy Treatment Patient Details Name: Albert Garrison MRN: [id number] DOB: Aug 02, 1945 Today's Date: 12/31/2020   History of Present Illness Albert Garrison is a 75 y.o. male with a history of type 2 diabetes, stage III chronic kidney disease, BPH, history of stroke, GERD, history of Cardiac Arrest.  The patient was recently admitted on 11/27/2020 and was discharged approximately 3 days ago from the hospital.  During that hospitalization, the patient went into cardiac arrest and had acute hypoxemic respiratory failure, he was intubated and ROSC was obtained after about 1 minute.  The patient presented this time with acute metabolic encephalopathy suspected to be related to UTI as well as some noted hematuria on presentation.  His urine cultures have demonstrated no growth, however he has had group A strep bacteremia noted and case was discussed with ID with recommendations to remain on oral cefdinir for 8 more days to complete 10-day course of treatment.  He has had improvement in leukocytosis as well as his mentation and has remained afebrile during the course of the    PT Comments    Pt friendly and willing to participate with therapy.  Pt confused with place and situation, able to recall name and DOB.  Used RW to address unsteadiness upon standing.  Able to ambulate slow labored movements without LOB episodes.  Does make wide turns when turning around.  Through session pt asked if therapist was going to ride with him when he makes it out to car.   EOS pt left in chair with call bell within reach, chair alarm set and RN aware of status.     Recommendations for follow up therapy are one component of a multi-disciplinary discharge planning process, led by the attending physician.  Recommendations may be updated based on patient status, additional functional criteria and insurance authorization.  Follow Up Recommendations  Supervision for mobility/OOB;Supervision - Intermittent;SNF      Equipment Recommendations  Rolling walker with 5" wheels    Recommendations for Other Services Rehab consult     Precautions / Restrictions Precautions Precautions: Fall     Mobility  Bed Mobility Overal bed mobility: Modified Independent Bed Mobility: Supine to Sit       Sit to supine: Supervision   General bed mobility comments: requires increased time and repeated verbal cues    Transfers Overall transfer level: Needs assistance Equipment used: Rolling walker (2 wheeled) Transfers: Sit to/from Stand Sit to Stand: Min guard         General transfer comment: slow labored movement, required use of RW for safety, cueing for hand placement to assist with standing not pulling on RW  Ambulation/Gait Ambulation/Gait assistance: Min guard Gait Distance (Feet): 100 Feet Assistive device: Rolling walker (2 wheeled) Gait Pattern/deviations: Decreased step length - left;Decreased stance time - right;Decreased stride length Gait velocity: Decreased   General Gait Details: RW for safety, slow labored movements, no LOB, makes wide turns while turning around   [employer] Mobility    Modified Rankin (Stroke Patients Only)       Balance                                            Cognition Arousal/Alertness: Awake/alert Behavior During Therapy: WFL for tasks assessed/performed;Impulsive Overall Cognitive Status: No family/caregiver present to determine baseline cognitive  functioning Area of Impairment: Memory                 Orientation Level: Time;Situation;Place Current Attention Level: Focused;Sustained Memory: Decreased recall of precautions;Decreased short-term memory Following Commands: Follows one step commands consistently Safety/Judgement: Decreased awareness of deficits;Decreased awareness of safety     General Comments: Pt able to recall name and DOB, unaware of place or situation, date       Exercises      General Comments        Pertinent Vitals/Pain Pain Assessment: No/denies pain    Home Living                      Prior Function            PT Goals (current goals can now be found in the care plan section)      Frequency    Min 3X/week      PT Plan Current plan remains appropriate    Co-evaluation              AM-PAC PT "6 Clicks" Mobility   Outcome Measure  Help needed turning from your back to your side while in a flat bed without using bedrails?: None Help needed moving from lying on your back to sitting on the side of a flat bed without using bedrails?: None Help needed moving to and from a bed to a chair (including a wheelchair)?: A Little Help needed standing up from a chair using your arms (e.g., wheelchair or bedside chair)?: A Little Help needed to walk in hospital room?: A Little Help needed climbing 3-5 steps with a railing? : A Lot 6 Click Score: 19    End of Session Equipment Utilized During Treatment: Gait belt Activity Tolerance: Patient tolerated treatment well;Patient limited by fatigue Patient left: in chair;with call bell/phone within reach;with chair alarm set Nurse Communication: Mobility status PT Visit Diagnosis: Unsteadiness on feet (R26.81);Other abnormalities of gait and mobility (R26.89);Muscle weakness (generalized) (M62.81)     Time: 2951-8841 PT Time Calculation (min) (ACUTE ONLY): 24 min  Charges:  $Therapeutic Activity: 8-22 mins                     [name], LPTA/CLT; CBIS [phone]  [name] 12/31/2020, 4:36 PM

## 2021-01-01 DIAGNOSIS — N390 Urinary tract infection, site not specified: Secondary | ICD-10-CM | POA: Diagnosis not present

## 2021-01-01 LAB — COMPREHENSIVE METABOLIC PANEL
ALT: 27 U/L (ref 0–44)
AST: 19 U/L (ref 15–41)
Albumin: 3.1 g/dL — ABNORMAL LOW (ref 3.5–5.0)
Alkaline Phosphatase: 83 U/L (ref 38–126)
Anion gap: 10 (ref 5–15)
BUN: 11 mg/dL (ref 8–23)
CO2: 23 mmol/L (ref 22–32)
Calcium: 8.8 mg/dL — ABNORMAL LOW (ref 8.9–10.3)
Chloride: 102 mmol/L (ref 98–111)
Creatinine, Ser: 1.09 mg/dL (ref 0.61–1.24)
GFR, Estimated: >60 mL/min (ref >60)
Glucose, Bld: 209 mg/dL — ABNORMAL HIGH (ref 70–99)
Potassium: 3.7 mmol/L (ref 3.5–5.1)
Sodium: 135 mmol/L (ref 135–145)
Total Bilirubin: 0.6 mg/dL (ref 0.3–1.2)
Total Protein: 6.9 g/dL (ref 6.5–8.1)

## 2021-01-01 LAB — GLUCOSE, CAPILLARY
Glucose-Capillary: 194 mg/dL — ABNORMAL HIGH (ref 70–99)
Glucose-Capillary: 240 mg/dL — ABNORMAL HIGH (ref 70–99)
Glucose-Capillary: 164 mg/dL — ABNORMAL HIGH (ref 70–99)
Glucose-Capillary: 191 mg/dL — ABNORMAL HIGH (ref 70–99)

## 2021-01-01 LAB — CBC WITH DIFFERENTIAL/PLATELET
Abs Immature Granulocytes: 0.04 10*3/uL (ref 0.00–0.07)
Basophils Absolute: 0 10*3/uL (ref 0.0–0.1)
Basophils Relative: 0 %
Eosinophils Absolute: 0.1 10*3/uL (ref 0.0–0.5)
Eosinophils Relative: 1 %
HCT: 36.8 % — ABNORMAL LOW (ref 39.0–52.0)
Hemoglobin: 12.1 g/dL — ABNORMAL LOW (ref 13.0–17.0)
Immature Granulocytes: 1 %
Lymphocytes Relative: 27 %
Lymphs Abs: 2.3 10*3/uL (ref 0.7–4.0)
MCH: 29.5 pg (ref 26.0–34.0)
MCHC: 32.9 g/dL (ref 30.0–36.0)
MCV: 89.8 fL (ref 80.0–100.0)
Monocytes Absolute: 0.7 10*3/uL (ref 0.1–1.0)
Monocytes Relative: 9 %
Neutro Abs: 5.4 10*3/uL (ref 1.7–7.7)
Neutrophils Relative %: 62 %
Platelets: 297 10*3/uL (ref 150–400)
RBC: 4.1 MIL/uL — ABNORMAL LOW (ref 4.22–5.81)
RDW: 13.6 % (ref 11.5–15.5)
WBC: 8.6 10*3/uL (ref 4.0–10.5)
nRBC: 0 % (ref 0.0–0.2)

## 2021-01-01 LAB — PHOSPHORUS: Phosphorus: 3.3 mg/dL (ref 2.5–4.6)

## 2021-01-01 LAB — MAGNESIUM: Magnesium: 2 mg/dL (ref 1.7–2.4)

## 2021-01-01 NOTE — Progress Notes (Signed)
Physical Therapy Treatment Patient Details Name: Albert Garrison MRN: [id number] DOB: 01-26-1946 Today's Date: 01/01/2021   History of Present Illness Albert Garrison is a 75 y.o. male with a history of type 2 diabetes, stage III chronic kidney disease, BPH, history of stroke, GERD, history of Cardiac Arrest.  The patient was recently admitted on 11/27/2020 and was discharged approximately 3 days ago from the hospital.  During that hospitalization, the patient went into cardiac arrest and had acute hypoxemic respiratory failure, he was intubated and ROSC was obtained after about 1 minute.  The patient presented this time with acute metabolic encephalopathy suspected to be related to UTI as well as some noted hematuria on presentation.  His urine cultures have demonstrated no growth, however he has had group A strep bacteremia noted and case was discussed with ID with recommendations to remain on oral cefdinir for 8 more days to complete 10-day course of treatment.  He has had improvement in leukocytosis as well as his mentation and has remained afebrile during the course of the    PT Comments    Pt with improved cognitive but still remains confused.  Pt mod I with walker for all mobility.  Deficits are due to confusion.  Pt would not be safe to live alone and most likely will need 24 hour supervision for safety but physically is doing well.    Recommendations for follow up therapy are one component of a multi-disciplinary discharge planning process, led by the attending physician.  Recommendations may be updated based on patient status, additional functional criteria and insurance authorization.  Follow Up Recommendations  Supervision for mobility/OOB;Supervision - Intermittent     Equipment Recommendations  Rolling walker with 5" wheels    Recommendations for Other Services Rehab consult     Precautions / Restrictions Precautions Precautions: None Precaution Comments: PT able to ambulate x  200 ft today w/ RW without dizziness     Mobility  Bed Mobility Overal bed mobility: Modified Independent                  Transfers Overall transfer level: Modified independent Equipment used: Rolling walker (2 wheeled)                Ambulation/Gait Ambulation/Gait assistance: Modified independent (Device/Increase time) Gait Distance (Feet): 200 Feet Assistive device: Rolling walker (2 wheeled) Gait Pattern/deviations: Decreased step length - left;Decreased stance time - right;Decreased stride length   Gait velocity interpretation: 1.31 - 2.62 ft/sec, indicative of limited community ambulator General Gait Details: RW for safety, slow labored movements, no LOB, makes wide turns while turning around   [employer] Mobility    Modified Rankin (Stroke Patients Only)       Balance                                            Cognition Arousal/Alertness: Awake/alert Behavior During Therapy: WFL for tasks assessed/performed;Impulsive Overall Cognitive Status: No family/caregiver present to determine baseline cognitive functioning Area of Impairment: Memory                 Orientation Level: Situation Current Attention Level: Focused;Sustained   Following Commands: Follows multi-step commands with increased time Safety/Judgement: Decreased awareness of safety   Problem Solving: Requires verbal cues (occasionally) General Comments: Pt able to recall  name and DOB, where his is and year      Exercises      General Comments        Pertinent Vitals/Pain Pain Assessment: No/denies pain    Home Living                      Prior Function            PT Goals (current goals can now be found in the care plan section) Acute Rehab PT Goals Patient Stated Goal: return home PT Goal Formulation: With patient Time For Goal Achievement: 01/11/21 Potential to Achieve Goals: Good    Frequency     Min 3X/week      PT Plan      Co-evaluation              AM-PAC PT "6 Clicks" Mobility   Outcome Measure  Help needed turning from your back to your side while in a flat bed without using bedrails?: None Help needed moving from lying on your back to sitting on the side of a flat bed without using bedrails?: None Help needed moving to and from a bed to a chair (including a wheelchair)?: None Help needed standing up from a chair using your arms (e.g., wheelchair or bedside chair)?: None Help needed to walk in hospital room?: A Little Help needed climbing 3-5 steps with a railing? : A Lot 6 Click Score: 21    End of Session Equipment Utilized During Treatment: Gait belt Activity Tolerance: Patient tolerated treatment well Patient left: in chair;with call bell/phone within reach;with chair alarm set Nurse Communication: Mobility status PT Visit Diagnosis: Unsteadiness on feet (R26.81);Muscle weakness (generalized) (M62.81)     Time: 940 - 1000    Charges:  gt                        Rayetta Humphrey, PT CLT [phone]  01/01/2021, 10:53 AM

## 2021-01-01 NOTE — NC FL2 (Signed)
[place] LEVEL OF CARE SCREENING TOOL     IDENTIFICATION  Patient Name: Albert Garrison Birthdate: 1945/05/29 Sex: male Admission Date (Current Location): 12/25/2020  [facility] [place] Number:  [employer] and Address:  [facility] [address], [place]      Provider Number: [id number]  Attending Physician Name and Address:  Rodena Goldmann, DO  Relative Name and Phone Number:  Szymon, Foiles (Brother)   [phone]    Current Level of Care: Hospital Recommended Level of Care: [place], Memory Care Prior Approval Number:    Date Approved/Denied:   PASRR Number: [id number]  Discharge Plan: Domiciliary (Rest home)    Current Diagnoses: Patient Active Problem List   Diagnosis Date Noted   Acute lower UTI 12/25/2020   Dysphagia 12/22/2020   Acute hypoxemic respiratory failure ([place]) 11/30/2020   Shock circulatory ([place]) 11/30/2020   Cardiac arrest ([place]) 11/30/2020   Other specified cardiac arrhythmias--- sleep apnea- arrhythmias when he desaturates  11/30/2020   Endotracheally intubated 11/30/2020   SBO (small bowel obstruction) ([place]) 11/28/2020   History of stroke 08/24/2017   Altered mental status 08/23/2017   Sleep apnea 08/23/2017   Elevated troponin 08/23/2017   Abnormal CT of the head 08/23/2017   Hypertension 08/23/2017   Type 2 diabetes mellitus ([place]) 08/23/2017   Syncope 08/23/2017   S/P carpal tunnel release right 07/13/17 07/27/2017   AKI (acute kidney injury) ([place]) 11/29/2015   Lower GI bleed 11/28/2015   Rectal bleed 11/28/2015   Rectal bleeding 11/28/2015   Benign prostatic hyperplasia with urinary retention 01/20/2015   Urinary retention 05/21/2014   ARF (acute renal failure) ([place]) 03/26/2014   Type 2 diabetes mellitus without complication, with long-term current use of insulin ([place]) 03/26/2014   UTI (lower urinary tract infection) 03/24/2014   Hyponatremia 03/24/2014   Acute  encephalopathy 03/24/2014    Orientation RESPIRATION BLADDER Height & Weight     Self, Time, Situation, Place  O2 (2L) Incontinent Weight: 168 lb 10.4 oz (76.5 kg) Height:  5\' 11"  (180.3 cm)  BEHAVIORAL SYMPTOMS/MOOD NEUROLOGICAL BOWEL NUTRITION STATUS      Continent Diet (DIET DYS 2 Room service appropriate? Yes; Fluid consistency: Thin)  AMBULATORY STATUS COMMUNICATION OF NEEDS Skin   Limited Assist Verbally Other (Comment) (Rash Left Groin)                       Personal Care Assistance Level of Assistance    Bathing Assistance: Limited assistance Feeding assistance: Independent Dressing Assistance: Limited assistance     Functional Limitations Info  Sight, Hearing, Speech Sight Info: Adequate Hearing Info: Impaired Speech Info: Adequate    SPECIAL CARE FACTORS FREQUENCY  PT (By licensed PT), OT (By licensed OT)     PT Frequency: 3x/week OT Frequency: 3x/week            Contractures Contractures Info: Not present    Additional Factors Info  Code Status, Allergies, Insulin Sliding Scale Code Status Info: Full Allergies Info: n/a   Insulin Sliding Scale Info: CBG 70 - 120: 0 units    CBG 121 - 150: 2 units    CBG 151 - 200: 3 units    CBG 201 - 250: 5 units    CBG 251 - 300: 8 units    CBG 301 - 350: 11 units    CBG 351 - 400: 15 units       Current Medications (01/01/2021):  This is the current hospital active medication list Current Facility-Administered Medications  Medication Dose Route Frequency Provider Last Rate Last Admin   acetaminophen (TYLENOL) tablet 650 mg  650 mg Oral Q6H PRN Adefeso, Oladapo, DO   650 mg at 12/27/20 0123   amLODipine (NORVASC) tablet 10 mg  10 mg Oral Daily Truett Mainland, DO   10 mg at 01/01/21 0801   aspirin chewable tablet 81 mg  81 mg Oral Daily Truett Mainland, DO   81 mg at 01/01/21 0801   atorvastatin (LIPITOR) tablet 40 mg  40 mg Oral [id number] Truett Mainland, DO   40 mg at 12/31/20 1749   cefTRIAXone (ROCEPHIN) 2 g  in sodium chloride 0.9 % 100 mL IVPB  2 g Intravenous Q24H Truett Mainland, DO 200 mL/hr at 12/31/20 1501 2 g at 12/31/20 1501   Chlorhexidine Gluconate Cloth 2 % PADS 6 each  6 each Topical Daily Truett Mainland, DO   6 each at 01/01/21 0802   enoxaparin (LOVENOX) injection 40 mg  40 mg Subcutaneous Q24H Manuella Ghazi, Pratik D, DO   40 mg at 12/31/20 1504   haloperidol (HALDOL) tablet 0.5 mg  0.5 mg Oral BID Heath Lark D, DO   0.5 mg at 01/01/21 0801   haloperidol lactate (HALDOL) injection 1-2 mg  1-2 mg Intravenous Q6H PRN Allie Bossier, MD   2 mg at 01/01/21 0102   insulin aspart (novoLOG) injection 0-15 Units  0-15 Units Subcutaneous TID WC Truett Mainland, DO   3 Units at 01/01/21 0802   metoprolol tartrate (LOPRESSOR) tablet 25 mg  25 mg Oral BID Truett Mainland, DO   25 mg at 01/01/21 0801   ondansetron (ZOFRAN) tablet 4 mg  4 mg Oral Q6H PRN Truett Mainland, DO       Or   ondansetron [facility]) injection 4 mg  4 mg Intravenous Q6H PRN Truett Mainland, DO       phenol (CHLORASEPTIC) mouth spray 1 spray  1 spray Mouth/Throat PRN Manuella Ghazi, Pratik D, DO       tamsulosin (FLOMAX) capsule 0.4 mg  0.4 mg Oral QPC supper Truett Mainland, DO   0.4 mg at 12/31/20 1749     Discharge Medications: Please see discharge summary for a list of discharge medications.  Relevant Imaging Results:  Relevant Lab Results:   Additional Information Pt SSN [id number]  Ihor Gully, LCSW

## 2021-01-01 NOTE — Progress Notes (Addendum)
PROGRESS NOTE    [name]  BHA:[id number] DOB: [birth year] DOA: 12/25/2020 PCP: Caprice Renshaw, MD   Brief Narrative:   Albert Garrison is a 75 y.o. male with a history of type 2 diabetes, stage III chronic kidney disease, BPH, history of stroke, GERD, history of Cardiac Arrest.  The patient was recently admitted on 11/27/2020 and was discharged approximately 3 days ago from the hospital.  During that hospitalization, the patient went into cardiac arrest and had acute hypoxemic respiratory failure, he was intubated and ROSC was obtained after about 1 minute.  The patient now presents with hematuria.  He has been admitted with some acute metabolic encephalopathy in the setting of catheter associated UTI.  He continues to have some intermittent confusion, but he does not qualify for SNF and therefore is unsafe for discharge.  APS evaluation pending as well as guardianship.  He does have group A strep bacteremia for which she is undergoing treatment with IV Rocephin.  This can be switched to oral cefdinir to complete a total 10-day course on discharge.  Assessment & Plan:   Principal Problem:   Acute lower UTI Active Problems:   Type 2 diabetes mellitus without complication, with long-term current use of insulin (HCC)   Benign prostatic hyperplasia with urinary retention   Hypertension   History of stroke   Acute metabolic encephalopathy superimposed on dementia secondary to CAUTI-improved -Baseline mentation is a little uncertain, but he does not appear safe for discharge to home on his own -TOC assisting with APS involvement and obtaining guardianship -Patient noted to have hematuria on presentation -Continue Rocephin for bacteremia as noted below and urine cultures with no growth -He might have had some foley trauma at SNF according to the brother   Group A strep bacteremia -Plan to treat with 10-day course of antibiotics -Currently on day 8 of Rocephin -Can switch to oral cefdinir  on discharge -No need to evaluate further for endocarditis -Discussed case with Dr. Gale Journey with ID -Repeat blood cultures ordered on 9/29 with no growth noted   Type 2 diabetes -DYS 2 diet -SSI   BPH with Foley catheter -Tried voiding trial 9/27, but he continued to have issues with urine retention -Foley catheter replaced on 9/27 and he will need outpatient urology follow-up -Continue Flomax   History of stroke -Continue aspirin -DYS 2 diet  Dementia -Noted atrophy with microvascular ischemia on CT head   History of NSTEMI -Continue aspirin, Lipitor, beta-blocker     DVT prophylaxis: Lovenox Code Status: Full Family Communication: Discussed with brother on phone 9/24 Disposition Plan:  Status is: Inpatient   Remains inpatient appropriate because:IV treatments appropriate due to intensity of illness or inability to take PO and Inpatient level of care appropriate due to severity of illness   Dispo: The patient is from: SNF              Anticipated d/c is to: SNF              Patient currently is not medically stable to d/c.              Difficult to place patient No   Consultants:  Discussed with ID   Procedures:  See below  Antimicrobials:  Anti-infectives (From admission, onward)    Start     Dose/Rate Route Frequency Ordered Stop   12/27/20 0000  cefdinir (OMNICEF) 300 MG capsule        300 mg Oral 2 times daily 12/27/20 1037  01/04/21 2359   12/26/20 1600  cefTRIAXone (ROCEPHIN) 2 g in sodium chloride 0.9 % 100 mL IVPB        2 g 200 mL/hr over 30 Minutes Intravenous Every 24 hours 12/25/20 2234     12/25/20 1800  cefTRIAXone (ROCEPHIN) 1 g in sodium chloride 0.9 % 100 mL IVPB        1 g 200 mL/hr over 30 Minutes Intravenous  Once 12/25/20 1755 12/25/20 1847   12/25/20 1515  cefTRIAXone (ROCEPHIN) 1 g in sodium chloride 0.9 % 100 mL IVPB        1 g 200 mL/hr over 30 Minutes Intravenous  Once 12/25/20 1510 12/25/20 1635   12/25/20 0000  cephALEXin (KEFLEX) 500  MG capsule  Status:  Discontinued        500 mg Oral 4 times daily 12/25/20 1533 12/27/20        Subjective: Patient seen and evaluated today with no new acute complaints or concerns. No acute concerns or events noted overnight.  Objective: Vitals:   12/31/20 1439 12/31/20 2056 01/01/21 0633 01/01/21 0757  BP: 121/71 114/85 (!) 110/55 138/70  Pulse: 72 75 69 70  Resp: 18 20 20    Temp: 98.6 F (37 C) 98.6 F (37 C) (!) 97.5 F (36.4 C)   TempSrc: Oral  Oral   SpO2: 97% 98% 97% 95%  Weight:      Height:        Intake/Output Summary (Last 24 hours) at 01/01/2021 1019 Last data filed at 01/01/2021 1017 Gross per 24 hour  Intake 1440 ml  Output 5200 ml  Net -3760 ml   Filed Weights   12/25/20 2058  Weight: 76.5 kg    Examination:  General exam: Appears calm and comfortable  Respiratory system: Clear to auscultation. Respiratory effort normal. Cardiovascular system: S1 & S2 heard, RRR.  Gastrointestinal system: Abdomen is soft Central nervous system: Alert and awake Extremities: No edema Skin: No significant lesions noted Psychiatry: Flat affect. Foley with clear, yellow urine output noted    Data Reviewed: I have personally reviewed following labs and imaging studies  CBC: Recent Labs  Lab 12/25/20 1624 12/26/20 0606 12/27/20 0530 12/30/20 0503 12/31/20 0511 01/01/21 0543  WBC 20.6* 16.6* 8.3 8.0 8.7 8.6  NEUTROABS 17.2*  --   --   --  4.7 5.4  HGB 12.3* 11.0* 11.4* 12.2* 12.4* 12.1*  HCT 37.3* 33.9* 35.2* 37.4* 38.8* 36.8*  MCV 91.0 90.4 91.2 91.7 90.2 89.8  PLT 302 288 272 319 342 366   Basic Metabolic Panel: Recent Labs  Lab 12/26/20 0606 12/27/20 0530 12/30/20 0503 12/31/20 0511 01/01/21 0543  NA 133* 134* 136 133* 135  K 3.4* 3.6 3.9 3.5 3.7  CL 102 104 103 99 102  CO2 22 23 25 24 23   GLUCOSE 176* 147* 188* 189* 209*  BUN 13 11 10 10 11   CREATININE 1.02 1.03 1.09 1.11 1.09  CALCIUM 7.9* 8.0* 8.8* 8.8* 8.8*  MG  --  1.8 2.1 2.0 2.0   PHOS  --   --   --  3.4 3.3   GFR: Estimated Creatinine Clearance: 61.4 mL/min (by C-G formula based on SCr of 1.09 mg/dL). Liver Function Tests: Recent Labs  Lab 12/25/20 1624 12/31/20 0511 01/01/21 0543  AST 22 25 19   ALT 18 34 27  ALKPHOS 89 86 83  BILITOT 0.7 0.6 0.6  PROT 6.8 7.1 6.9  ALBUMIN 3.0* 3.3* 3.1*   No results for input(s): LIPASE,  AMYLASE in the last 168 hours. No results for input(s): AMMONIA in the last 168 hours. Coagulation Profile: No results for input(s): INR, PROTIME in the last 168 hours. Cardiac Enzymes: No results for input(s): CKTOTAL, CKMB, CKMBINDEX, TROPONINI in the last 168 hours. BNP (last 3 results) No results for input(s): PROBNP in the last 8760 hours. HbA1C: No results for input(s): HGBA1C in the last 72 hours. CBG: Recent Labs  Lab 12/31/20 0747 12/31/20 1127 12/31/20 1623 12/31/20 2058 01/01/21 0745  GLUCAP 198* 135* 201* 231* 194*   Lipid Profile: No results for input(s): CHOL, HDL, LDLCALC, TRIG, CHOLHDL, LDLDIRECT in the last 72 hours. Thyroid Function Tests: No results for input(s): TSH, T4TOTAL, FREET4, T3FREE, THYROIDAB in the last 72 hours. Anemia Panel: No results for input(s): VITAMINB12, FOLATE, FERRITIN, TIBC, IRON, RETICCTPCT in the last 72 hours. Sepsis Labs: Recent Labs  Lab 12/25/20 1624  LATICACIDVEN 1.6    Recent Results (from the past 240 hour(s))  Resp Panel by RT-PCR (Flu A&B, Covid) Nasopharyngeal Swab     Status: None   Collection Time: 12/23/20 11:37 AM   Specimen: Nasopharyngeal Swab; Nasopharyngeal(NP) swabs in vial transport medium  Result Value Ref Range Status   SARS Coronavirus 2 by RT PCR NEGATIVE NEGATIVE Final    Comment: (NOTE) SARS-CoV-2 target nucleic acids are NOT DETECTED.  The SARS-CoV-2 RNA is generally detectable in upper respiratory specimens during the acute phase of infection. The lowest concentration of SARS-CoV-2 viral copies this assay can detect is 138 copies/mL. A  negative result does not preclude SARS-Cov-2 infection and should not be used as the sole basis for treatment or other patient management decisions. A negative result may occur with  improper specimen collection/handling, submission of specimen other than nasopharyngeal swab, presence of viral mutation(s) within the areas targeted by this assay, and inadequate number of viral copies(<138 copies/mL). A negative result must be combined with clinical observations, patient history, and epidemiological information. The expected result is Negative.  Fact Sheet for Patients:  EntrepreneurPulse.com.au  Fact Sheet for Healthcare Providers:  IncredibleEmployment.be  This test is no t yet approved or cleared by the [place] FDA and  has been authorized for detection and/or diagnosis of SARS-CoV-2 by FDA under an Emergency Use Authorization (EUA). This EUA will remain  in effect (meaning this test can be used) for the duration of the COVID-19 declaration under Section 564(b)(1) of the Act, 21 U.S.C.section 360bbb-3(b)(1), unless the authorization is terminated  or revoked sooner.       Influenza A by PCR NEGATIVE NEGATIVE Final   Influenza B by PCR NEGATIVE NEGATIVE Final    Comment: (NOTE) The Xpert Xpress SARS-CoV-2/FLU/RSV plus assay is intended as an aid in the diagnosis of influenza from Nasopharyngeal swab specimens and should not be used as a sole basis for treatment. Nasal washings and aspirates are unacceptable for Xpert Xpress SARS-CoV-2/FLU/RSV testing.  Fact Sheet for Patients: EntrepreneurPulse.com.au  Fact Sheet for Healthcare Providers: IncredibleEmployment.be  This test is not yet approved or cleared by the [place] FDA and has been authorized for detection and/or diagnosis of SARS-CoV-2 by FDA under an Emergency Use Authorization (EUA). This EUA will remain in effect (meaning this test can  be used) for the duration of the COVID-19 declaration under Section 564(b)(1) of the Act, 21 U.S.C. section 360bbb-3(b)(1), unless the authorization is terminated or revoked.  Performed at [facility], [place] [address], [place],  [address]   Urine Culture     Status: Abnormal  Collection Time: 12/25/20  2:31 PM   Specimen: Urine, Catheterized  Result Value Ref Range Status   Specimen Description   Final    URINE, CATHETERIZED Performed at [facility], [address] [place], [place] [address]    Special Requests   Final    NONE Performed at [facility], [address] [place], [place] [address]    Culture (A)  Final    80,000 COLONIES/mL STREPTOCOCCUS PYOGENES Beta hemolytic streptococci are predictably susceptible to penicillin and other beta lactams. Susceptibility testing not routinely performed. Performed at [facility], [place] [address], [place], [place] [address]    Report Status 12/27/2020 FINAL  Final  Culture, blood (Routine X 2) w Reflex to ID Panel     Status: Abnormal   Collection Time: 12/25/20  3:44 PM   Specimen: BLOOD RIGHT FOREARM  Result Value Ref Range Status   Specimen Description   Final    BLOOD RIGHT FOREARM Performed at [facility], [address] [place], [place] [address]    Special Requests   Final    BOTTLES DRAWN AEROBIC AND ANAEROBIC Blood Culture adequate volume Performed at [facility], [address] [place], [place] [address]    Culture  Setup Time   Final    GRAM POSITIVE COCCI IN BOTH AEROBIC AND ANAEROBIC BOTTLES Gram Stain Report Called to,Read Back By and Verified With: WATSON @ 1019 ON [id number] BY HENDERSON L CRITICAL RESULT CALLED TO, READ BACK BY AND VERIFIED WITH: S HURTH PHARMD @1400  12/26/21 EB Performed at [facility], [place] [address] [place], [place] [address]    Culture GROUP A STREP (S.PYOGENES) ISOLATED (A)  Final   Report Status 12/28/2020 FINAL  Final   Organism ID, Bacteria GROUP A STREP  (S.PYOGENES) ISOLATED  Final      Susceptibility   Group a strep (s.pyogenes) isolated - MIC*    PENICILLIN <=0.06 SENSITIVE Sensitive     CEFTRIAXONE <=0.12 SENSITIVE Sensitive     ERYTHROMYCIN <=0.12 SENSITIVE Sensitive     LEVOFLOXACIN 0.5 SENSITIVE Sensitive     VANCOMYCIN 0.5 SENSITIVE Sensitive     * GROUP A STREP (S.PYOGENES) ISOLATED  Blood Culture ID Panel (Reflexed)     Status: Abnormal   Collection Time: 12/25/20  3:44 PM  Result Value Ref Range Status   Enterococcus faecalis NOT DETECTED NOT DETECTED Final   Enterococcus Faecium NOT DETECTED NOT DETECTED Final   Listeria monocytogenes NOT DETECTED NOT DETECTED Final   Staphylococcus species NOT DETECTED NOT DETECTED Final   Staphylococcus aureus (BCID) NOT DETECTED NOT DETECTED Final   Staphylococcus epidermidis NOT DETECTED NOT DETECTED Final   Staphylococcus lugdunensis NOT DETECTED NOT DETECTED Final   Streptococcus species DETECTED (A) NOT DETECTED Final    Comment: CRITICAL RESULT CALLED TO, READ BACK BY AND VERIFIED WITH: S HURTH PHARMD @1400  12/26/21 EB    Streptococcus agalactiae NOT DETECTED NOT DETECTED Final   Streptococcus pneumoniae NOT DETECTED NOT DETECTED Final   Streptococcus pyogenes DETECTED (A) NOT DETECTED Final    Comment: CRITICAL RESULT CALLED TO, READ BACK BY AND VERIFIED WITH: S HURTH PHARMD @1400  12/26/21 EB    A.calcoaceticus-baumannii NOT DETECTED NOT DETECTED Final   Bacteroides fragilis NOT DETECTED NOT DETECTED Final   Enterobacterales NOT DETECTED NOT DETECTED Final   Enterobacter cloacae complex NOT DETECTED NOT DETECTED Final   Escherichia coli NOT DETECTED NOT DETECTED Final   Klebsiella aerogenes NOT DETECTED NOT DETECTED Final   Klebsiella oxytoca NOT  DETECTED NOT DETECTED Final   Klebsiella pneumoniae NOT DETECTED NOT DETECTED Final   Proteus species NOT DETECTED NOT DETECTED Final   Salmonella species NOT DETECTED NOT DETECTED Final   Serratia marcescens NOT DETECTED NOT  DETECTED Final   Haemophilus influenzae NOT DETECTED NOT DETECTED Final   Neisseria meningitidis NOT DETECTED NOT DETECTED Final   Pseudomonas aeruginosa NOT DETECTED NOT DETECTED Final   Stenotrophomonas maltophilia NOT DETECTED NOT DETECTED Final   Candida albicans NOT DETECTED NOT DETECTED Final   Candida auris NOT DETECTED NOT DETECTED Final   Candida glabrata NOT DETECTED NOT DETECTED Final   Candida krusei NOT DETECTED NOT DETECTED Final   Candida parapsilosis NOT DETECTED NOT DETECTED Final   Candida tropicalis NOT DETECTED NOT DETECTED Final   Cryptococcus neoformans/gattii NOT DETECTED NOT DETECTED Final    Comment: Performed at [facility], [place] [address] [place], [place] [address]  Resp Panel by RT-PCR (Flu A&B, Covid) Nasopharyngeal Swab     Status: None   Collection Time: 12/25/20  4:15 PM   Specimen: Nasopharyngeal Swab; Nasopharyngeal(NP) swabs in vial transport medium  Result Value Ref Range Status   SARS Coronavirus 2 by RT PCR NEGATIVE NEGATIVE Final    Comment: (NOTE) SARS-CoV-2 target nucleic acids are NOT DETECTED.  The SARS-CoV-2 RNA is generally detectable in upper respiratory specimens during the acute phase of infection. The lowest concentration of SARS-CoV-2 viral copies this assay can detect is 138 copies/mL. A negative result does not preclude SARS-Cov-2 infection and should not be used as the sole basis for treatment or other patient management decisions. A negative result may occur with  improper specimen collection/handling, submission of specimen other than nasopharyngeal swab, presence of viral mutation(s) within the areas targeted by this assay, and inadequate number of viral copies(<138 copies/mL). A negative result must be combined with clinical observations, patient history, and epidemiological information. The expected result is Negative.  Fact Sheet for Patients:  EntrepreneurPulse.com.au  Fact Sheet for Healthcare  Providers:  IncredibleEmployment.be  This test is no t yet approved or cleared by the [place] FDA and  has been authorized for detection and/or diagnosis of SARS-CoV-2 by FDA under an Emergency Use Authorization (EUA). This EUA will remain  in effect (meaning this test can be used) for the duration of the COVID-19 declaration under Section 564(b)(1) of the Act, 21 U.S.C.section 360bbb-3(b)(1), unless the authorization is terminated  or revoked sooner.       Influenza A by PCR NEGATIVE NEGATIVE Final   Influenza B by PCR NEGATIVE NEGATIVE Final    Comment: (NOTE) The Xpert Xpress SARS-CoV-2/FLU/RSV plus assay is intended as an aid in the diagnosis of influenza from Nasopharyngeal swab specimens and should not be used as a sole basis for treatment. Nasal washings and aspirates are unacceptable for Xpert Xpress SARS-CoV-2/FLU/RSV testing.  Fact Sheet for Patients: EntrepreneurPulse.com.au  Fact Sheet for Healthcare Providers: IncredibleEmployment.be  This test is not yet approved or cleared by the [place] FDA and has been authorized for detection and/or diagnosis of SARS-CoV-2 by FDA under an Emergency Use Authorization (EUA). This EUA will remain in effect (meaning this test can be used) for the duration of the COVID-19 declaration under Section 564(b)(1) of the Act, 21 U.S.C. section 360bbb-3(b)(1), unless the authorization is terminated or revoked.  Performed at [facility], [address], [place],  [address]   Culture, blood (Routine X 2) w Reflex to ID Panel     Status: None  Collection Time: 12/25/20  4:24 PM   Specimen: BLOOD LEFT HAND  Result Value Ref Range Status   Specimen Description BLOOD LEFT HAND  Final   Special Requests   Final    BOTTLES DRAWN AEROBIC AND ANAEROBIC Blood Culture results may not be optimal due to an excessive volume of blood received in culture bottles   Culture    Final    NO GROWTH 5 DAYS Performed at [facility], [address], [place], [place] [address]    Report Status 12/30/2020 FINAL  Final  Culture, blood (Routine X 2) w Reflex to ID Panel     Status: None (Preliminary result)   Collection Time: 12/31/20  5:19 PM   Specimen: BLOOD RIGHT HAND  Result Value Ref Range Status   Specimen Description BLOOD RIGHT HAND  Final   Special Requests   Final    Blood Culture results may not be optimal due to an excessive volume of blood received in culture bottles BOTTLES DRAWN AEROBIC AND ANAEROBIC   Culture   Final    NO GROWTH < 12 HOURS Performed at [facility], [address] [place], [place] [address]    Report Status PENDING  Incomplete  Culture, blood (Routine X 2) w Reflex to ID Panel     Status: None (Preliminary result)   Collection Time: 12/31/20  5:19 PM   Specimen: BLOOD LEFT ARM  Result Value Ref Range Status   Specimen Description BLOOD LEFT ARM  Final   Special Requests   Final    Blood Culture results may not be optimal due to an excessive volume of blood received in culture bottles BOTTLES DRAWN AEROBIC AND ANAEROBIC   Culture   Final    NO GROWTH < 12 HOURS Performed at [facility], [address] [place], [place] [address]    Report Status PENDING  Incomplete         Radiology Studies: No results found.      Scheduled Meds:  amLODipine  10 mg Oral Daily   aspirin  81 mg Oral Daily   atorvastatin  40 mg Oral q1800   Chlorhexidine Gluconate Cloth  6 each Topical Daily   enoxaparin (LOVENOX) injection  40 mg Subcutaneous Q24H   haloperidol  0.5 mg Oral BID   insulin aspart  0-15 Units Subcutaneous TID WC   metoprolol tartrate  25 mg Oral BID   tamsulosin  0.4 mg Oral QPC supper   Continuous Infusions:  cefTRIAXone (ROCEPHIN)  IV 2 g (12/31/20 1501)     LOS: 7 days    Time spent: 35 minutes    Shaan Rhoads Darleen Crocker, DO [facility]  If 7PM-7AM, please contact  night-coverage [email or web address] 01/01/2021, 10:19 AM

## 2021-01-02 DIAGNOSIS — N390 Urinary tract infection, site not specified: Secondary | ICD-10-CM | POA: Diagnosis not present

## 2021-01-02 LAB — COMPREHENSIVE METABOLIC PANEL
ALT: 24 U/L (ref 0–44)
AST: 17 U/L (ref 15–41)
Albumin: 3.1 g/dL — ABNORMAL LOW (ref 3.5–5.0)
Alkaline Phosphatase: 82 U/L (ref 38–126)
Anion gap: 7 (ref 5–15)
BUN: 9 mg/dL (ref 8–23)
CO2: 25 mmol/L (ref 22–32)
Calcium: 8.4 mg/dL — ABNORMAL LOW (ref 8.9–10.3)
Chloride: 102 mmol/L (ref 98–111)
Creatinine, Ser: 1.07 mg/dL (ref 0.61–1.24)
GFR, Estimated: >60 mL/min (ref >60)
Glucose, Bld: 205 mg/dL — ABNORMAL HIGH (ref 70–99)
Potassium: 3.7 mmol/L (ref 3.5–5.1)
Sodium: 134 mmol/L — ABNORMAL LOW (ref 135–145)
Total Bilirubin: 0.9 mg/dL (ref 0.3–1.2)
Total Protein: 6.9 g/dL (ref 6.5–8.1)

## 2021-01-02 LAB — GLUCOSE, CAPILLARY
Glucose-Capillary: 205 mg/dL — ABNORMAL HIGH (ref 70–99)
Glucose-Capillary: 166 mg/dL — ABNORMAL HIGH (ref 70–99)
Glucose-Capillary: 161 mg/dL — ABNORMAL HIGH (ref 70–99)
Glucose-Capillary: 204 mg/dL — ABNORMAL HIGH (ref 70–99)
Glucose-Capillary: 214 mg/dL — ABNORMAL HIGH (ref 70–99)

## 2021-01-02 LAB — CBC WITH DIFFERENTIAL/PLATELET
Abs Immature Granulocytes: 0.04 10*3/uL (ref 0.00–0.07)
Basophils Absolute: 0 10*3/uL (ref 0.0–0.1)
Basophils Relative: 0 %
Eosinophils Absolute: 0.1 10*3/uL (ref 0.0–0.5)
Eosinophils Relative: 1 %
HCT: 34.8 % — ABNORMAL LOW (ref 39.0–52.0)
Hemoglobin: 11.3 g/dL — ABNORMAL LOW (ref 13.0–17.0)
Immature Granulocytes: 1 %
Lymphocytes Relative: 35 %
Lymphs Abs: 2.8 10*3/uL (ref 0.7–4.0)
MCH: 29.7 pg (ref 26.0–34.0)
MCHC: 32.5 g/dL (ref 30.0–36.0)
MCV: 91.6 fL (ref 80.0–100.0)
Monocytes Absolute: 0.7 10*3/uL (ref 0.1–1.0)
Monocytes Relative: 9 %
Neutro Abs: 4.4 10*3/uL (ref 1.7–7.7)
Neutrophils Relative %: 54 %
Platelets: 301 10*3/uL (ref 150–400)
RBC: 3.8 MIL/uL — ABNORMAL LOW (ref 4.22–5.81)
RDW: 13.8 % (ref 11.5–15.5)
WBC: 8 10*3/uL (ref 4.0–10.5)
nRBC: 0 % (ref 0.0–0.2)

## 2021-01-02 LAB — PHOSPHORUS: Phosphorus: 3 mg/dL (ref 2.5–4.6)

## 2021-01-02 LAB — MAGNESIUM: Magnesium: 2 mg/dL (ref 1.7–2.4)

## 2021-01-02 NOTE — Plan of Care (Signed)
  Problem: Nutrition: Goal: Adequate nutrition will be maintained Outcome: Progressing   Problem: Safety: Goal: Ability to remain free from injury will improve Outcome: Progressing   

## 2021-01-02 NOTE — Progress Notes (Signed)
PROGRESS NOTE    Albert Garrison  XFG:[id number] DOB: 23-Aug-1945 DOA: 12/25/2020 PCP: Caprice Renshaw, MD   Brief Narrative:   Albert Garrison is a 75 y.o. male with a history of type 2 diabetes, stage III chronic kidney disease, BPH, history of stroke, GERD, history of Cardiac Arrest.  The patient was recently admitted on 11/27/2020 and was discharged approximately 3 days ago from the hospital.  During that hospitalization, the patient went into cardiac arrest and had acute hypoxemic respiratory failure, he was intubated and ROSC was obtained after about 1 minute.  The patient now presents with hematuria.  He has been admitted with some acute metabolic encephalopathy in the setting of catheter associated UTI.  He continues to have some intermittent confusion, but he does not qualify for SNF and therefore is unsafe for discharge.  APS evaluation pending as well as guardianship.  He does have group A strep bacteremia for which he is undergoing treatment with IV Rocephin.   Assessment & Plan:   Principal Problem:   Acute lower UTI Active Problems:   Type 2 diabetes mellitus without complication, with long-term current use of insulin (HCC)   Benign prostatic hyperplasia with urinary retention   Hypertension   History of stroke   Acute metabolic encephalopathy superimposed on dementia secondary to CAUTI-improved -Baseline mentation is a little uncertain, but he does not appear safe for discharge to home on his own -TOC assisting with APS involvement and obtaining guardianship -Patient noted to have hematuria on presentation -Continue Rocephin for bacteremia as noted below and urine cultures with no growth -He might have had some foley trauma at SNF according to the brother -Currently requiring sitter at bedside due to periods of agitation and needing frequent redirection   Group A strep bacteremia -Plan to treat with 10-day course of antibiotics -Currently on day 9 of Rocephin (may discontinue  after dose of medication on 10/2) -Can switch to oral cefdinir on discharge, but does not appear that this will be necessary since he will finish his IV medication course -No need to evaluate further for endocarditis -Discussed case with Dr. Gale Journey with ID -Repeat blood cultures ordered on 9/29 with no growth noted -Plan to hold off on a.m. labs for 10/2   Type 2 diabetes -DYS 2 diet -SSI   BPH with Foley catheter -Tried voiding trial 9/27, but he continued to have issues with urine retention -Foley catheter replaced on 9/27 and he will need outpatient urology follow-up -Continue Flomax   History of stroke -Continue aspirin -DYS 2 diet   Dementia -Noted atrophy with microvascular ischemia on CT head   History of NSTEMI -Continue aspirin, Lipitor, beta-blocker     DVT prophylaxis: Lovenox Code Status: Full Family Communication: Discussed with brother on phone 9/24 Disposition Plan:  Status is: Inpatient   Remains inpatient appropriate because:IV treatments appropriate due to intensity of illness or inability to take PO and Inpatient level of care appropriate due to severity of illness   Dispo: The patient is from: SNF              Anticipated d/c is to: SNF              Patient currently is not medically stable to d/c.              Difficult to place patient No   Consultants:  Discussed with ID   Procedures:  See below  Antimicrobials:  Anti-infectives (From admission, onward)    Start  Dose/Rate Route Frequency Ordered Stop   12/27/20 0000  cefdinir (OMNICEF) 300 MG capsule        300 mg Oral 2 times daily 12/27/20 1037 01/04/21 2359   12/26/20 1600  cefTRIAXone (ROCEPHIN) 2 g in sodium chloride 0.9 % 100 mL IVPB        2 g 200 mL/hr over 30 Minutes Intravenous Every 24 hours 12/25/20 2234     12/25/20 1800  cefTRIAXone (ROCEPHIN) 1 g in sodium chloride 0.9 % 100 mL IVPB        1 g 200 mL/hr over 30 Minutes Intravenous  Once 12/25/20 1755 12/25/20 1847    12/25/20 1515  cefTRIAXone (ROCEPHIN) 1 g in sodium chloride 0.9 % 100 mL IVPB        1 g 200 mL/hr over 30 Minutes Intravenous  Once 12/25/20 1510 12/25/20 1635   12/25/20 0000  cephALEXin (KEFLEX) 500 MG capsule  Status:  Discontinued        500 mg Oral 4 times daily 12/25/20 1533 12/27/20       Subjective: Patient seen and evaluated today with no new acute complaints or concerns. No acute concerns or events noted overnight.  Objective: Vitals:   01/01/21 0757 01/01/21 1255 01/01/21 2130 01/02/21 0615  BP: 138/70 (!) 141/74 134/76 130/64  Pulse: 70 70 74 68  Resp:   18   Temp:   98.9 F (37.2 C) (!) 97.4 F (36.3 C)  TempSrc:    Oral  SpO2: 95% 98% 100% 97%  Weight:      Height:        Intake/Output Summary (Last 24 hours) at 01/02/2021 1124 Last data filed at 01/02/2021 0500 Gross per 24 hour  Intake 720 ml  Output 2500 ml  Net -1780 ml   Filed Weights   12/25/20 2058  Weight: 76.5 kg    Examination:  General exam: Appears calm and comfortable  Respiratory system: Clear to auscultation. Respiratory effort normal. Cardiovascular system: S1 & S2 heard, RRR.  Gastrointestinal system: Abdomen is soft Central nervous system: Alert and awake Extremities: No edema Skin: No significant lesions noted Psychiatry: Flat affect. Foley with clear, yellow urine output noted    Data Reviewed: I have personally reviewed following labs and imaging studies  CBC: Recent Labs  Lab 12/27/20 0530 12/30/20 0503 12/31/20 0511 01/01/21 0543 01/02/21 0527  WBC 8.3 8.0 8.7 8.6 8.0  NEUTROABS  --   --  4.7 5.4 4.4  HGB 11.4* 12.2* 12.4* 12.1* 11.3*  HCT 35.2* 37.4* 38.8* 36.8* 34.8*  MCV 91.2 91.7 90.2 89.8 91.6  PLT 272 319 342 297 809   Basic Metabolic Panel: Recent Labs  Lab 12/27/20 0530 12/30/20 0503 12/31/20 0511 01/01/21 0543 01/02/21 0527  NA 134* 136 133* 135 134*  K 3.6 3.9 3.5 3.7 3.7  CL 104 103 99 102 102  CO2 23 25 24 23 25   GLUCOSE 147* 188* 189*  209* 205*  BUN 11 10 10 11 9   CREATININE 1.03 1.09 1.11 1.09 1.07  CALCIUM 8.0* 8.8* 8.8* 8.8* 8.4*  MG 1.8 2.1 2.0 2.0 2.0  PHOS  --   --  3.4 3.3 3.0   GFR: Estimated Creatinine Clearance: 62.6 mL/min (by C-G formula based on SCr of 1.07 mg/dL). Liver Function Tests: Recent Labs  Lab 12/31/20 0511 01/01/21 0543 01/02/21 0527  AST 25 19 17   ALT 34 27 24  ALKPHOS 86 83 82  BILITOT 0.6 0.6 0.9  PROT 7.1 6.9 6.9  ALBUMIN 3.3* 3.1* 3.1*   No results for input(s): LIPASE, AMYLASE in the last 168 hours. No results for input(s): AMMONIA in the last 168 hours. Coagulation Profile: No results for input(s): INR, PROTIME in the last 168 hours. Cardiac Enzymes: No results for input(s): CKTOTAL, CKMB, CKMBINDEX, TROPONINI in the last 168 hours. BNP (last 3 results) No results for input(s): PROBNP in the last 8760 hours. HbA1C: No results for input(s): HGBA1C in the last 72 hours. CBG: Recent Labs  Lab 01/01/21 1120 01/01/21 1637 01/01/21 2133 01/02/21 0719 01/02/21 1100  GLUCAP 240* 164* 191* 205* 166*   Lipid Profile: No results for input(s): CHOL, HDL, LDLCALC, TRIG, CHOLHDL, LDLDIRECT in the last 72 hours. Thyroid Function Tests: No results for input(s): TSH, T4TOTAL, FREET4, T3FREE, THYROIDAB in the last 72 hours. Anemia Panel: No results for input(s): VITAMINB12, FOLATE, FERRITIN, TIBC, IRON, RETICCTPCT in the last 72 hours. Sepsis Labs: No results for input(s): PROCALCITON, LATICACIDVEN in the last 168 hours.  Recent Results (from the past 240 hour(s))  Resp Panel by RT-PCR (Flu A&B, Covid) Nasopharyngeal Swab     Status: None   Collection Time: 12/23/20 11:37 AM   Specimen: Nasopharyngeal Swab; Nasopharyngeal(NP) swabs in vial transport medium  Result Value Ref Range Status   SARS Coronavirus 2 by RT PCR NEGATIVE NEGATIVE Final    Comment: (NOTE) SARS-CoV-2 target nucleic acids are NOT DETECTED.  The SARS-CoV-2 RNA is generally detectable in upper  respiratory specimens during the acute phase of infection. The lowest concentration of SARS-CoV-2 viral copies this assay can detect is 138 copies/mL. A negative result does not preclude SARS-Cov-2 infection and should not be used as the sole basis for treatment or other patient management decisions. A negative result may occur with  improper specimen collection/handling, submission of specimen other than nasopharyngeal swab, presence of viral mutation(s) within the areas targeted by this assay, and inadequate number of viral copies(<138 copies/mL). A negative result must be combined with clinical observations, patient history, and epidemiological information. The expected result is Negative.  Fact Sheet for Patients:  EntrepreneurPulse.com.au  Fact Sheet for Healthcare Providers:  IncredibleEmployment.be  This test is no t yet approved or cleared by the [place] FDA and  has been authorized for detection and/or diagnosis of SARS-CoV-2 by FDA under an Emergency Use Authorization (EUA). This EUA will remain  in effect (meaning this test can be used) for the duration of the COVID-19 declaration under Section 564(b)(1) of the Act, 21 U.S.C.section 360bbb-3(b)(1), unless the authorization is terminated  or revoked sooner.       Influenza A by PCR NEGATIVE NEGATIVE Final   Influenza B by PCR NEGATIVE NEGATIVE Final    Comment: (NOTE) The Xpert Xpress SARS-CoV-2/FLU/RSV plus assay is intended as an aid in the diagnosis of influenza from Nasopharyngeal swab specimens and should not be used as a sole basis for treatment. Nasal washings and aspirates are unacceptable for Xpert Xpress SARS-CoV-2/FLU/RSV testing.  Fact Sheet for Patients: EntrepreneurPulse.com.au  Fact Sheet for Healthcare Providers: IncredibleEmployment.be  This test is not yet approved or cleared by the [place] FDA and has been  authorized for detection and/or diagnosis of SARS-CoV-2 by FDA under an Emergency Use Authorization (EUA). This EUA will remain in effect (meaning this test can be used) for the duration of the COVID-19 declaration under Section 564(b)(1) of the Act, 21 U.S.C. section 360bbb-3(b)(1), unless the authorization is terminated or revoked.  Performed at [facility], [place] [address] [place], [place]  [address]   Urine Culture     Status: Abnormal   Collection Time: 12/25/20  2:31 PM   Specimen: Urine, Catheterized  Result Value Ref Range Status   Specimen Description   Final    URINE, CATHETERIZED Performed at [facility], [address], [place], [place] [address]    Special Requests   Final    NONE Performed at [facility], [address], [place], [place] [address]    Culture (A)  Final    80,000 COLONIES/mL STREPTOCOCCUS PYOGENES Beta hemolytic streptococci are predictably susceptible to penicillin and other beta lactams. Susceptibility testing not routinely performed. Performed at [facility], [place] [address], [place], [place] [address]    Report Status 12/27/2020 FINAL  Final  Culture, blood (Routine X 2) w Reflex to ID Panel     Status: Abnormal   Collection Time: 12/25/20  3:44 PM   Specimen: BLOOD RIGHT FOREARM  Result Value Ref Range Status   Specimen Description   Final    BLOOD RIGHT FOREARM Performed at [facility], [address], [place], [place] [address]    Special Requests   Final    BOTTLES DRAWN AEROBIC AND ANAEROBIC Blood Culture adequate volume Performed at [facility], [address], [place], [place] [address]    Culture  Setup Time   Final    GRAM POSITIVE COCCI IN BOTH AEROBIC AND ANAEROBIC BOTTLES Gram Stain Report Called to,Read Back By and Verified With: WATSON @ 1019 ON [id number] BY HENDERSON L CRITICAL RESULT CALLED TO, READ BACK BY AND VERIFIED WITH: S HURTH PHARMD @1400  12/26/21 EB Performed at [facility], [place] [address],  [place], [place] [address]    Culture GROUP A STREP (S.PYOGENES) ISOLATED (A)  Final   Report Status 12/28/2020 FINAL  Final   Organism ID, Bacteria GROUP A STREP (S.PYOGENES) ISOLATED  Final      Susceptibility   Group a strep (s.pyogenes) isolated - MIC*    PENICILLIN <=0.06 SENSITIVE Sensitive     CEFTRIAXONE <=0.12 SENSITIVE Sensitive     ERYTHROMYCIN <=0.12 SENSITIVE Sensitive     LEVOFLOXACIN 0.5 SENSITIVE Sensitive     VANCOMYCIN 0.5 SENSITIVE Sensitive     * GROUP A STREP (S.PYOGENES) ISOLATED  Blood Culture ID Panel (Reflexed)     Status: Abnormal   Collection Time: 12/25/20  3:44 PM  Result Value Ref Range Status   Enterococcus faecalis NOT DETECTED NOT DETECTED Final   Enterococcus Faecium NOT DETECTED NOT DETECTED Final   Listeria monocytogenes NOT DETECTED NOT DETECTED Final   Staphylococcus species NOT DETECTED NOT DETECTED Final   Staphylococcus aureus (BCID) NOT DETECTED NOT DETECTED Final   Staphylococcus epidermidis NOT DETECTED NOT DETECTED Final   Staphylococcus lugdunensis NOT DETECTED NOT DETECTED Final   Streptococcus species DETECTED (A) NOT DETECTED Final    Comment: CRITICAL RESULT CALLED TO, READ BACK BY AND VERIFIED WITH: S HURTH PHARMD @1400  12/26/21 EB    Streptococcus agalactiae NOT DETECTED NOT DETECTED Final   Streptococcus pneumoniae NOT DETECTED NOT DETECTED Final   Streptococcus pyogenes DETECTED (A) NOT DETECTED Final    Comment: CRITICAL RESULT CALLED TO, READ BACK BY AND VERIFIED WITH: S HURTH PHARMD @1400  12/26/21 EB    A.calcoaceticus-baumannii NOT DETECTED NOT DETECTED Final   Bacteroides fragilis NOT DETECTED NOT DETECTED Final   Enterobacterales NOT DETECTED NOT DETECTED Final   Enterobacter cloacae complex NOT DETECTED NOT DETECTED Final   Escherichia coli NOT DETECTED NOT DETECTED Final  Klebsiella aerogenes NOT DETECTED NOT DETECTED Final   Klebsiella oxytoca NOT DETECTED NOT DETECTED Final   Klebsiella pneumoniae NOT DETECTED NOT  DETECTED Final   Proteus species NOT DETECTED NOT DETECTED Final   Salmonella species NOT DETECTED NOT DETECTED Final   Serratia marcescens NOT DETECTED NOT DETECTED Final   Haemophilus influenzae NOT DETECTED NOT DETECTED Final   Neisseria meningitidis NOT DETECTED NOT DETECTED Final   Pseudomonas aeruginosa NOT DETECTED NOT DETECTED Final   Stenotrophomonas maltophilia NOT DETECTED NOT DETECTED Final   Candida albicans NOT DETECTED NOT DETECTED Final   Candida auris NOT DETECTED NOT DETECTED Final   Candida glabrata NOT DETECTED NOT DETECTED Final   Candida krusei NOT DETECTED NOT DETECTED Final   Candida parapsilosis NOT DETECTED NOT DETECTED Final   Candida tropicalis NOT DETECTED NOT DETECTED Final   Cryptococcus neoformans/gattii NOT DETECTED NOT DETECTED Final    Comment: Performed at [facility], [place] [address], [place], [place] [address]  Resp Panel by RT-PCR (Flu A&B, Covid) Nasopharyngeal Swab     Status: None   Collection Time: 12/25/20  4:15 PM   Specimen: Nasopharyngeal Swab; Nasopharyngeal(NP) swabs in vial transport medium  Result Value Ref Range Status   SARS Coronavirus 2 by RT PCR NEGATIVE NEGATIVE Final    Comment: (NOTE) SARS-CoV-2 target nucleic acids are NOT DETECTED.  The SARS-CoV-2 RNA is generally detectable in upper respiratory specimens during the acute phase of infection. The lowest concentration of SARS-CoV-2 viral copies this assay can detect is 138 copies/mL. A negative result does not preclude SARS-Cov-2 infection and should not be used as the sole basis for treatment or other patient management decisions. A negative result may occur with  improper specimen collection/handling, submission of specimen other than nasopharyngeal swab, presence of viral mutation(s) within the areas targeted by this assay, and inadequate number of viral copies(<138 copies/mL). A negative result must be combined with clinical observations, patient history, and  epidemiological information. The expected result is Negative.  Fact Sheet for Patients:  EntrepreneurPulse.com.au  Fact Sheet for Healthcare Providers:  IncredibleEmployment.be  This test is no t yet approved or cleared by the [place] FDA and  has been authorized for detection and/or diagnosis of SARS-CoV-2 by FDA under an Emergency Use Authorization (EUA). This EUA will remain  in effect (meaning this test can be used) for the duration of the COVID-19 declaration under Section 564(b)(1) of the Act, 21 U.S.C.section 360bbb-3(b)(1), unless the authorization is terminated  or revoked sooner.       Influenza A by PCR NEGATIVE NEGATIVE Final   Influenza B by PCR NEGATIVE NEGATIVE Final    Comment: (NOTE) The Xpert Xpress SARS-CoV-2/FLU/RSV plus assay is intended as an aid in the diagnosis of influenza from Nasopharyngeal swab specimens and should not be used as a sole basis for treatment. Nasal washings and aspirates are unacceptable for Xpert Xpress SARS-CoV-2/FLU/RSV testing.  Fact Sheet for Patients: EntrepreneurPulse.com.au  Fact Sheet for Healthcare Providers: IncredibleEmployment.be  This test is not yet approved or cleared by the [place] FDA and has been authorized for detection and/or diagnosis of SARS-CoV-2 by FDA under an Emergency Use Authorization (EUA). This EUA will remain in effect (meaning this test can be used) for the duration of the COVID-19 declaration under Section 564(b)(1) of the Act, 21 U.S.C. section 360bbb-3(b)(1), unless the authorization is terminated or revoked.  Performed at [facility], [address] [place], [place] [address]   Culture, blood (Routine X 2) w  Reflex to ID Panel     Status: None   Collection Time: 12/25/20  4:24 PM   Specimen: BLOOD LEFT HAND  Result Value Ref Range Status   Specimen Description BLOOD LEFT HAND  Final   Special Requests    Final    BOTTLES DRAWN AEROBIC AND ANAEROBIC Blood Culture results may not be optimal due to an excessive volume of blood received in culture bottles   Culture   Final    NO GROWTH 5 DAYS Performed at [facility], [address] [place], [place] [address]    Report Status 12/30/2020 FINAL  Final  Culture, blood (Routine X 2) w Reflex to ID Panel     Status: None (Preliminary result)   Collection Time: 12/31/20  5:19 PM   Specimen: BLOOD RIGHT HAND  Result Value Ref Range Status   Specimen Description BLOOD RIGHT HAND  Final   Special Requests   Final    Blood Culture results may not be optimal due to an excessive volume of blood received in culture bottles BOTTLES DRAWN AEROBIC AND ANAEROBIC   Culture   Final    NO GROWTH 2 DAYS Performed at [facility], [address] [place], [place] [address]    Report Status PENDING  Incomplete  Culture, blood (Routine X 2) w Reflex to ID Panel     Status: None (Preliminary result)   Collection Time: 12/31/20  5:19 PM   Specimen: BLOOD LEFT ARM  Result Value Ref Range Status   Specimen Description BLOOD LEFT ARM  Final   Special Requests   Final    Blood Culture results may not be optimal due to an excessive volume of blood received in culture bottles BOTTLES DRAWN AEROBIC AND ANAEROBIC   Culture   Final    NO GROWTH 2 DAYS Performed at [facility], [address], [place], [place] [address]    Report Status PENDING  Incomplete         Radiology Studies: No results found.      Scheduled Meds:  amLODipine  10 mg Oral Daily   aspirin  81 mg Oral Daily   atorvastatin  40 mg Oral q1800   Chlorhexidine Gluconate Cloth  6 each Topical Daily   enoxaparin (LOVENOX) injection  40 mg Subcutaneous Q24H   haloperidol  0.5 mg Oral BID   insulin aspart  0-15 Units Subcutaneous TID WC   metoprolol tartrate  25 mg Oral BID   tamsulosin  0.4 mg Oral QPC supper   Continuous Infusions:  cefTRIAXone (ROCEPHIN)  IV 2 g (01/01/21 1504)      LOS: 8 days    Time spent: 35 minutes    Albert Cavenaugh Darleen Crocker, DO [facility]  If 7PM-7AM, please contact night-coverage [email or web address] 01/02/2021, 11:24 AM

## 2021-01-03 DIAGNOSIS — N390 Urinary tract infection, site not specified: Secondary | ICD-10-CM | POA: Diagnosis not present

## 2021-01-03 LAB — GLUCOSE, CAPILLARY
Glucose-Capillary: 184 mg/dL — ABNORMAL HIGH (ref 70–99)
Glucose-Capillary: 196 mg/dL — ABNORMAL HIGH (ref 70–99)
Glucose-Capillary: 168 mg/dL — ABNORMAL HIGH (ref 70–99)
Glucose-Capillary: 170 mg/dL — ABNORMAL HIGH (ref 70–99)

## 2021-01-03 LAB — CBC WITH DIFFERENTIAL/PLATELET
Abs Immature Granulocytes: 0.04 10*3/uL (ref 0.00–0.07)
Basophils Absolute: 0.1 10*3/uL (ref 0.0–0.1)
Basophils Relative: 1 %
Eosinophils Absolute: 0.1 10*3/uL (ref 0.0–0.5)
Eosinophils Relative: 1 %
HCT: 37.3 % — ABNORMAL LOW (ref 39.0–52.0)
Hemoglobin: 11.9 g/dL — ABNORMAL LOW (ref 13.0–17.0)
Immature Granulocytes: 1 %
Lymphocytes Relative: 35 %
Lymphs Abs: 2.8 10*3/uL (ref 0.7–4.0)
MCH: 29.2 pg (ref 26.0–34.0)
MCHC: 31.9 g/dL (ref 30.0–36.0)
MCV: 91.4 fL (ref 80.0–100.0)
Monocytes Absolute: 0.7 10*3/uL (ref 0.1–1.0)
Monocytes Relative: 9 %
Neutro Abs: 4.4 10*3/uL (ref 1.7–7.7)
Neutrophils Relative %: 53 %
Platelets: 312 10*3/uL (ref 150–400)
RBC: 4.08 MIL/uL — ABNORMAL LOW (ref 4.22–5.81)
RDW: 13.9 % (ref 11.5–15.5)
WBC: 8 10*3/uL (ref 4.0–10.5)
nRBC: 0 % (ref 0.0–0.2)

## 2021-01-03 LAB — COMPREHENSIVE METABOLIC PANEL
ALT: 23 U/L (ref 0–44)
AST: 18 U/L (ref 15–41)
Albumin: 3.1 g/dL — ABNORMAL LOW (ref 3.5–5.0)
Alkaline Phosphatase: 82 U/L (ref 38–126)
Anion gap: 7 (ref 5–15)
BUN: 9 mg/dL (ref 8–23)
CO2: 25 mmol/L (ref 22–32)
Calcium: 8.6 mg/dL — ABNORMAL LOW (ref 8.9–10.3)
Chloride: 102 mmol/L (ref 98–111)
Creatinine, Ser: 1.09 mg/dL (ref 0.61–1.24)
GFR, Estimated: >60 mL/min (ref >60)
Glucose, Bld: 178 mg/dL — ABNORMAL HIGH (ref 70–99)
Potassium: 3.7 mmol/L (ref 3.5–5.1)
Sodium: 134 mmol/L — ABNORMAL LOW (ref 135–145)
Total Bilirubin: 0.7 mg/dL (ref 0.3–1.2)
Total Protein: 6.8 g/dL (ref 6.5–8.1)

## 2021-01-03 LAB — MAGNESIUM: Magnesium: 2 mg/dL (ref 1.7–2.4)

## 2021-01-03 LAB — PHOSPHORUS: Phosphorus: 3 mg/dL (ref 2.5–4.6)

## 2021-01-03 NOTE — Plan of Care (Signed)
  Problem: Education: Goal: Knowledge of General Education information will improve Description Including pain rating scale, medication(s)/side effects and non-pharmacologic comfort measures Outcome: Progressing   Problem: Health Behavior/Discharge Planning: Goal: Ability to manage health-related needs will improve Outcome: Progressing   

## 2021-01-03 NOTE — Progress Notes (Addendum)
PROGRESS NOTE    Albert Garrison  YFV:[id number] DOB: [birth year] DOA: 12/25/2020 PCP: Caprice Renshaw, MD   Brief Narrative:   Albert Garrison is a 75 y.o. male with a history of type 2 diabetes, stage III chronic kidney disease, BPH, history of stroke, GERD, history of Cardiac Arrest.  The patient was recently admitted on 11/27/2020 and was discharged approximately 3 days ago from the hospital.  During that hospitalization, the patient went into cardiac arrest and had acute hypoxemic respiratory failure, he was intubated and ROSC was obtained after about 1 minute.  The patient now presents with hematuria.  He has been admitted with some acute metabolic encephalopathy in the setting of catheter associated UTI.  He continues to have some intermittent confusion, but he does not qualify for SNF and therefore is unsafe for discharge.  APS evaluation pending as well as guardianship.  He does have group A strep bacteremia for which she is undergoing treatment with IV Rocephin.  This can be switched to oral cefdinir to complete a total 10-day course on discharge.  01/03/2021: Seen and examined at his bedside.  He is alert and confused.  Sitter in place.  He needs frequent redirection.  Plan is to discharge to ALF.  TOC assisting with DC planning.  Assessment & Plan:   Principal Problem:   Acute lower UTI Active Problems:   Type 2 diabetes mellitus without complication, with long-term current use of insulin (HCC)   Benign prostatic hyperplasia with urinary retention   Hypertension   History of stroke   Persistent acute metabolic encephalopathy superimposed on dementia secondary to CAUTI -Baseline mentation is a little uncertain, but he does not appear safe for discharge to home on his own -TOC assisting with APS involvement and obtaining guardianship -Patient noted to have hematuria on presentation -Continue Rocephin for bacteremia as noted below and urine cultures with no growth, end date of  antibiotics 01-03-21. -He might have had some foley trauma at SNF according to the brother  BPH with chronic urinary retention, Foley catheter in place. Tried voiding trial 9/27, but he continued to have issues with urine retention -Foley catheter replaced on 9/27 and he will need outpatient urology follow-up He will need outpatient urology follow-up Continue Flomax  Mild euvolemic hyponatremia Serum sodium 134 Monitor for now Encourage oral intake   Group A strep bacteremia -Plan to treat with 10-day course of antibiotics -Currently on day 8 of Rocephin -Can switch to oral cefdinir on discharge -No need to evaluate further for endocarditis -Discussed case with Dr. Gale Journey with ID -Repeat blood cultures ordered on 9/29 with no growth noted   Type 2 diabetes -DYS 2 diet -SSI   History of stroke -Continue aspirin -DYS 2 diet  Dementia -Noted atrophy with microvascular ischemia on CT head   History of NSTEMI -Continue aspirin, Lipitor, beta-blocker     DVT prophylaxis: Lovenox subcu daily Code Status: Full Family Communication: Discussed with brother on phone 9/24 Disposition Plan:  Status is: Inpatient   Remains inpatient appropriate because:IV treatments appropriate due to intensity of illness or inability to take PO and Inpatient level of care appropriate due to severity of illness   Dispo: The patient is from: SNF              Anticipated d/c is to: ALF              Patient currently is not medically stable to d/c.  Difficult to place patient No   Consultants:  Discussed with ID   Procedures:  See below  Antimicrobials:  Anti-infectives (From admission, onward)    Start     Dose/Rate Route Frequency Ordered Stop   12/27/20 0000  cefdinir (OMNICEF) 300 MG capsule        300 mg Oral 2 times daily 12/27/20 1037 01/04/21 2359   12/26/20 1600  cefTRIAXone (ROCEPHIN) 2 g in sodium chloride 0.9 % 100 mL IVPB        2 g 200 mL/hr over 30 Minutes  Intravenous Every 24 hours 12/25/20 2234 01/03/21 2359   12/25/20 1800  cefTRIAXone (ROCEPHIN) 1 g in sodium chloride 0.9 % 100 mL IVPB        1 g 200 mL/hr over 30 Minutes Intravenous  Once 12/25/20 1755 12/25/20 1847   12/25/20 1515  cefTRIAXone (ROCEPHIN) 1 g in sodium chloride 0.9 % 100 mL IVPB        1 g 200 mL/hr over 30 Minutes Intravenous  Once 12/25/20 1510 12/25/20 1635   12/25/20 0000  cephALEXin (KEFLEX) 500 MG capsule  Status:  Discontinued        500 mg Oral 4 times daily 12/25/20 1533 12/27/20        Objective: Vitals:   01/02/21 1325 01/02/21 2059 01/03/21 0550 01/03/21 1402  BP: 112/68 122/65 (!) 133/96 112/67  Pulse: 66 68 63 (!) 57  Resp: 17 20 19    Temp: (!) 97.5 F (36.4 C) 97.8 F (36.6 C) 99.2 F (37.3 C) 98 F (36.7 C)  TempSrc:  Oral Oral   SpO2: 96% 97% 94% 97%  Weight:      Height:        Intake/Output Summary (Last 24 hours) at 01/03/2021 1535 Last data filed at 01/03/2021 1300 Gross per 24 hour  Intake 780 ml  Output 1600 ml  Net -820 ml   Filed Weights   12/25/20 2058  Weight: 76.5 kg    Examination:  General exam: Alert and confused. Respiratory system: Clear to auscultation no wheezes or rales.  Poor inspiratory effort.   Cardiovascular system: Regular rate and rhythm no rubs gallops. Gastrointestinal system: Soft nondistended normal bowel sounds present. Central nervous system: Alert and awake.   Extremities: No edema Skin: No significant lesions noted. Psychiatry: Flat affect.    Data Reviewed: I have personally reviewed following labs and imaging studies  CBC: Recent Labs  Lab 12/30/20 0503 12/31/20 0511 01/01/21 0543 01/02/21 0527 01/03/21 0403  WBC 8.0 8.7 8.6 8.0 8.0  NEUTROABS  --  4.7 5.4 4.4 4.4  HGB 12.2* 12.4* 12.1* 11.3* 11.9*  HCT 37.4* 38.8* 36.8* 34.8* 37.3*  MCV 91.7 90.2 89.8 91.6 91.4  PLT 319 342 297 301 681   Basic Metabolic Panel: Recent Labs  Lab 12/30/20 0503 12/31/20 0511 01/01/21 0543  01/02/21 0527 01/03/21 0403  NA 136 133* 135 134* 134*  K 3.9 3.5 3.7 3.7 3.7  CL 103 99 102 102 102  CO2 25 24 23 25 25   GLUCOSE 188* 189* 209* 205* 178*  BUN 10 10 11 9 9   CREATININE 1.09 1.11 1.09 1.07 1.09  CALCIUM 8.8* 8.8* 8.8* 8.4* 8.6*  MG 2.1 2.0 2.0 2.0 2.0  PHOS  --  3.4 3.3 3.0 3.0   GFR: Estimated Creatinine Clearance: 61.4 mL/min (by C-G formula based on SCr of 1.09 mg/dL). Liver Function Tests: Recent Labs  Lab 12/31/20 0511 01/01/21 0543 01/02/21 0527 01/03/21 0403  AST 25  19 17 18   ALT 34 27 24 23   ALKPHOS 86 83 82 82  BILITOT 0.6 0.6 0.9 0.7  PROT 7.1 6.9 6.9 6.8  ALBUMIN 3.3* 3.1* 3.1* 3.1*   No results for input(s): LIPASE, AMYLASE in the last 168 hours. No results for input(s): AMMONIA in the last 168 hours. Coagulation Profile: No results for input(s): INR, PROTIME in the last 168 hours. Cardiac Enzymes: No results for input(s): CKTOTAL, CKMB, CKMBINDEX, TROPONINI in the last 168 hours. BNP (last 3 results) No results for input(s): PROBNP in the last 8760 hours. HbA1C: No results for input(s): HGBA1C in the last 72 hours. CBG: Recent Labs  Lab 01/02/21 1610 01/02/21 2049 01/02/21 2052 01/03/21 0721 01/03/21 1119  GLUCAP 161* 204* 214* 184* 196*   Lipid Profile: No results for input(s): CHOL, HDL, LDLCALC, TRIG, CHOLHDL, LDLDIRECT in the last 72 hours. Thyroid Function Tests: No results for input(s): TSH, T4TOTAL, FREET4, T3FREE, THYROIDAB in the last 72 hours. Anemia Panel: No results for input(s): VITAMINB12, FOLATE, FERRITIN, TIBC, IRON, RETICCTPCT in the last 72 hours. Sepsis Labs: No results for input(s): PROCALCITON, LATICACIDVEN in the last 168 hours.   Recent Results (from the past 240 hour(s))  Urine Culture     Status: Abnormal   Collection Time: 12/25/20  2:31 PM   Specimen: Urine, Catheterized  Result Value Ref Range Status   Specimen Description   Final    URINE, CATHETERIZED Performed at [facility], [address] [place], [place] [address]    Special Requests   Final    NONE Performed at [facility], [address] [place], [place] [address]    Culture (A)  Final    80,000 COLONIES/mL STREPTOCOCCUS PYOGENES Beta hemolytic streptococci are predictably susceptible to penicillin and other beta lactams. Susceptibility testing not routinely performed. Performed at [facility], [place] [address], [place], [place] [address]    Report Status 12/27/2020 FINAL  Final  Culture, blood (Routine X 2) w Reflex to ID Panel     Status: Abnormal   Collection Time: 12/25/20  3:44 PM   Specimen: BLOOD RIGHT FOREARM  Result Value Ref Range Status   Specimen Description   Final    BLOOD RIGHT FOREARM Performed at [facility], [address], [place], [place] [address]    Special Requests   Final    BOTTLES DRAWN AEROBIC AND ANAEROBIC Blood Culture adequate volume Performed at [facility], [address], [place], [place] [address]    Culture  Setup Time   Final    GRAM POSITIVE COCCI IN BOTH AEROBIC AND ANAEROBIC BOTTLES Gram Stain Report Called to,Read Back By and Verified With: WATSON @ 1019 ON [id number] BY HENDERSON L CRITICAL RESULT CALLED TO, READ BACK BY AND VERIFIED WITH: S HURTH PHARMD @1400  12/26/21 EB Performed at [facility], [place] [address], [place], [place] [address]    Culture GROUP A STREP (S.PYOGENES) ISOLATED (A)  Final   Report Status 12/28/2020 FINAL  Final   Organism ID, Bacteria GROUP A STREP (S.PYOGENES) ISOLATED  Final      Susceptibility   Group a strep (s.pyogenes) isolated - MIC*    PENICILLIN <=0.06 SENSITIVE Sensitive     CEFTRIAXONE <=0.12 SENSITIVE Sensitive     ERYTHROMYCIN <=0.12 SENSITIVE Sensitive     LEVOFLOXACIN 0.5 SENSITIVE Sensitive     VANCOMYCIN 0.5 SENSITIVE Sensitive     * GROUP A STREP (S.PYOGENES) ISOLATED  Blood Culture ID Panel (Reflexed)  Status: Abnormal   Collection Time: 12/25/20  3:44 PM  Result Value Ref Range Status   Enterococcus  faecalis NOT DETECTED NOT DETECTED Final   Enterococcus Faecium NOT DETECTED NOT DETECTED Final   Listeria monocytogenes NOT DETECTED NOT DETECTED Final   Staphylococcus species NOT DETECTED NOT DETECTED Final   Staphylococcus aureus (BCID) NOT DETECTED NOT DETECTED Final   Staphylococcus epidermidis NOT DETECTED NOT DETECTED Final   Staphylococcus lugdunensis NOT DETECTED NOT DETECTED Final   Streptococcus species DETECTED (A) NOT DETECTED Final    Comment: CRITICAL RESULT CALLED TO, READ BACK BY AND VERIFIED WITH: S HURTH PHARMD @1400  12/26/21 EB    Streptococcus agalactiae NOT DETECTED NOT DETECTED Final   Streptococcus pneumoniae NOT DETECTED NOT DETECTED Final   Streptococcus pyogenes DETECTED (A) NOT DETECTED Final    Comment: CRITICAL RESULT CALLED TO, READ BACK BY AND VERIFIED WITH: S HURTH PHARMD @1400  12/26/21 EB    A.calcoaceticus-baumannii NOT DETECTED NOT DETECTED Final   Bacteroides fragilis NOT DETECTED NOT DETECTED Final   Enterobacterales NOT DETECTED NOT DETECTED Final   Enterobacter cloacae complex NOT DETECTED NOT DETECTED Final   Escherichia coli NOT DETECTED NOT DETECTED Final   Klebsiella aerogenes NOT DETECTED NOT DETECTED Final   Klebsiella oxytoca NOT DETECTED NOT DETECTED Final   Klebsiella pneumoniae NOT DETECTED NOT DETECTED Final   Proteus species NOT DETECTED NOT DETECTED Final   Salmonella species NOT DETECTED NOT DETECTED Final   Serratia marcescens NOT DETECTED NOT DETECTED Final   Haemophilus influenzae NOT DETECTED NOT DETECTED Final   Neisseria meningitidis NOT DETECTED NOT DETECTED Final   Pseudomonas aeruginosa NOT DETECTED NOT DETECTED Final   Stenotrophomonas maltophilia NOT DETECTED NOT DETECTED Final   Candida albicans NOT DETECTED NOT DETECTED Final   Candida auris NOT DETECTED NOT DETECTED Final   Candida glabrata NOT DETECTED NOT DETECTED Final   Candida krusei NOT DETECTED NOT DETECTED Final   Candida parapsilosis NOT DETECTED NOT  DETECTED Final   Candida tropicalis NOT DETECTED NOT DETECTED Final   Cryptococcus neoformans/gattii NOT DETECTED NOT DETECTED Final    Comment: Performed at [facility], [place]. [address] [place], [place] [address]  Resp Panel by RT-PCR (Flu A&B, Covid) Nasopharyngeal Swab     Status: None   Collection Time: 12/25/20  4:15 PM   Specimen: Nasopharyngeal Swab; Nasopharyngeal(NP) swabs in vial transport medium  Result Value Ref Range Status   SARS Coronavirus 2 by RT PCR NEGATIVE NEGATIVE Final    Comment: (NOTE) SARS-CoV-2 target nucleic acids are NOT DETECTED.  The SARS-CoV-2 RNA is generally detectable in upper respiratory specimens during the acute phase of infection. The lowest concentration of SARS-CoV-2 viral copies this assay can detect is 138 copies/mL. A negative result does not preclude SARS-Cov-2 infection and should not be used as the sole basis for treatment or other patient management decisions. A negative result may occur with  improper specimen collection/handling, submission of specimen other than nasopharyngeal swab, presence of viral mutation(s) within the areas targeted by this assay, and inadequate number of viral copies(<138 copies/mL). A negative result must be combined with clinical observations, patient history, and epidemiological information. The expected result is Negative.  Fact Sheet for Patients:  EntrepreneurPulse.com.au  Fact Sheet for Healthcare Providers:  IncredibleEmployment.be  This test is no t yet approved or cleared by the [place] FDA and  has been authorized for detection and/or diagnosis of SARS-CoV-2 by FDA under an Emergency Use Authorization (EUA). This EUA will remain  in effect (meaning this test can be used) for the duration of the COVID-19 declaration under Section 564(b)(1) of the Act, 21 U.S.C.section 360bbb-3(b)(1), unless the authorization is terminated  or revoked sooner.        Influenza A by PCR NEGATIVE NEGATIVE Final   Influenza B by PCR NEGATIVE NEGATIVE Final    Comment: (NOTE) The Xpert Xpress SARS-CoV-2/FLU/RSV plus assay is intended as an aid in the diagnosis of influenza from Nasopharyngeal swab specimens and should not be used as a sole basis for treatment. Nasal washings and aspirates are unacceptable for Xpert Xpress SARS-CoV-2/FLU/RSV testing.  Fact Sheet for Patients: EntrepreneurPulse.com.au  Fact Sheet for Healthcare Providers: IncredibleEmployment.be  This test is not yet approved or cleared by the [place] FDA and has been authorized for detection and/or diagnosis of SARS-CoV-2 by FDA under an Emergency Use Authorization (EUA). This EUA will remain in effect (meaning this test can be used) for the duration of the COVID-19 declaration under Section 564(b)(1) of the Act, 21 U.S.C. section 360bbb-3(b)(1), unless the authorization is terminated or revoked.  Performed at [facility], [address], [place], [place] [address]   Culture, blood (Routine X 2) w Reflex to ID Panel     Status: None   Collection Time: 12/25/20  4:24 PM   Specimen: BLOOD LEFT HAND  Result Value Ref Range Status   Specimen Description BLOOD LEFT HAND  Final   Special Requests   Final    BOTTLES DRAWN AEROBIC AND ANAEROBIC Blood Culture results may not be optimal due to an excessive volume of blood received in culture bottles   Culture   Final    NO GROWTH 5 DAYS Performed at [facility], [address], [place], [place] [address]    Report Status 12/30/2020 FINAL  Final  Culture, blood (Routine X 2) w Reflex to ID Panel     Status: None (Preliminary result)   Collection Time: 12/31/20  5:19 PM   Specimen: BLOOD RIGHT HAND  Result Value Ref Range Status   Specimen Description BLOOD RIGHT HAND  Final   Special Requests   Final    Blood Culture results may not be optimal due to an excessive volume of blood received in  culture bottles BOTTLES DRAWN AEROBIC AND ANAEROBIC   Culture   Final    NO GROWTH 3 DAYS Performed at [facility], [address], [place], [place] [address]    Report Status PENDING  Incomplete  Culture, blood (Routine X 2) w Reflex to ID Panel     Status: None (Preliminary result)   Collection Time: 12/31/20  5:19 PM   Specimen: BLOOD LEFT ARM  Result Value Ref Range Status   Specimen Description BLOOD LEFT ARM  Final   Special Requests   Final    Blood Culture results may not be optimal due to an excessive volume of blood received in culture bottles BOTTLES DRAWN AEROBIC AND ANAEROBIC   Culture   Final    NO GROWTH 3 DAYS Performed at [facility], [address] [place],  [address]    Report Status PENDING  Incomplete         Radiology Studies: No results found.      Scheduled Meds:  amLODipine  10 mg Oral Daily   aspirin  81 mg Oral Daily   atorvastatin  40 mg Oral q1800   Chlorhexidine Gluconate Cloth  6 each Topical Daily   enoxaparin (LOVENOX) injection  40 mg Subcutaneous Q24H  haloperidol  0.5 mg Oral BID   insulin aspart  0-15 Units Subcutaneous TID WC   metoprolol tartrate  25 mg Oral BID   tamsulosin  0.4 mg Oral QPC supper   Continuous Infusions:  cefTRIAXone (ROCEPHIN)  IV 2 g (01/02/21 1531)     LOS: 9 days    Time spent: 35 minutes    Kayleen Memos, DO [facility]  If 7PM-7AM, please contact night-coverage [email or web address] 01/03/2021, 3:35 PM

## 2021-01-04 DIAGNOSIS — N390 Urinary tract infection, site not specified: Secondary | ICD-10-CM | POA: Diagnosis not present

## 2021-01-04 LAB — GLUCOSE, CAPILLARY
Glucose-Capillary: 167 mg/dL — ABNORMAL HIGH (ref 70–99)
Glucose-Capillary: 120 mg/dL — ABNORMAL HIGH (ref 70–99)
Glucose-Capillary: 166 mg/dL — ABNORMAL HIGH (ref 70–99)
Glucose-Capillary: 132 mg/dL — ABNORMAL HIGH (ref 70–99)

## 2021-01-04 LAB — PHOSPHORUS: Phosphorus: 2.8 mg/dL (ref 2.5–4.6)

## 2021-01-04 LAB — CBC WITH DIFFERENTIAL/PLATELET
Abs Immature Granulocytes: 0.03 10*3/uL (ref 0.00–0.07)
Basophils Absolute: 0.1 10*3/uL (ref 0.0–0.1)
Basophils Relative: 1 %
Eosinophils Absolute: 0.1 10*3/uL (ref 0.0–0.5)
Eosinophils Relative: 1 %
HCT: 37.1 % — ABNORMAL LOW (ref 39.0–52.0)
Hemoglobin: 11.9 g/dL — ABNORMAL LOW (ref 13.0–17.0)
Immature Granulocytes: 0 %
Lymphocytes Relative: 37 %
Lymphs Abs: 3.3 10*3/uL (ref 0.7–4.0)
MCH: 29.6 pg (ref 26.0–34.0)
MCHC: 32.1 g/dL (ref 30.0–36.0)
MCV: 92.3 fL (ref 80.0–100.0)
Monocytes Absolute: 0.8 10*3/uL (ref 0.1–1.0)
Monocytes Relative: 9 %
Neutro Abs: 4.7 10*3/uL (ref 1.7–7.7)
Neutrophils Relative %: 52 %
Platelets: 365 10*3/uL (ref 150–400)
RBC: 4.02 MIL/uL — ABNORMAL LOW (ref 4.22–5.81)
RDW: 13.9 % (ref 11.5–15.5)
WBC: 9 10*3/uL (ref 4.0–10.5)
nRBC: 0 % (ref 0.0–0.2)

## 2021-01-04 LAB — COMPREHENSIVE METABOLIC PANEL
ALT: 20 U/L (ref 0–44)
AST: 18 U/L (ref 15–41)
Albumin: 3.2 g/dL — ABNORMAL LOW (ref 3.5–5.0)
Alkaline Phosphatase: 83 U/L (ref 38–126)
Anion gap: 8 (ref 5–15)
BUN: 12 mg/dL (ref 8–23)
CO2: 25 mmol/L (ref 22–32)
Calcium: 8.8 mg/dL — ABNORMAL LOW (ref 8.9–10.3)
Chloride: 101 mmol/L (ref 98–111)
Creatinine, Ser: 1.23 mg/dL (ref 0.61–1.24)
GFR, Estimated: >60 mL/min (ref >60)
Glucose, Bld: 179 mg/dL — ABNORMAL HIGH (ref 70–99)
Potassium: 3.9 mmol/L (ref 3.5–5.1)
Sodium: 134 mmol/L — ABNORMAL LOW (ref 135–145)
Total Bilirubin: 0.9 mg/dL (ref 0.3–1.2)
Total Protein: 7 g/dL (ref 6.5–8.1)

## 2021-01-04 LAB — MAGNESIUM: Magnesium: 1.9 mg/dL (ref 1.7–2.4)

## 2021-01-04 NOTE — Progress Notes (Signed)
PROGRESS NOTE    Albert Garrison  TKZ:[id number] DOB: November 30, 1945 DOA: 12/25/2020 PCP: Caprice Renshaw, MD   Brief Narrative:   Albert Garrison is a 75 y.o. male with a history of type 2 diabetes, stage III chronic kidney disease, BPH, history of stroke, GERD, history of Cardiac Arrest.  The patient was recently admitted on 11/27/2020 and was discharged approximately 3 days ago from the hospital.  During that hospitalization, the patient went into cardiac arrest and had acute hypoxemic respiratory failure, he was intubated and ROSC was obtained after about 1 minute.  The patient now presents with hematuria.  He has been admitted with some acute metabolic encephalopathy in the setting of catheter associated UTI.  He continues to have some intermittent confusion, but he does not qualify for SNF and therefore is unsafe for discharge.  APS evaluation pending as well as guardianship.  He does have group A strep bacteremia for which she is undergoing treatment with IV Rocephin.  This can be switched to oral cefdinir to complete a total 10-day course on discharge.  01/04/2021: No new complaints.  The patient is alert and pleasantly confused.  Assessment & Plan:   Principal Problem:   Acute lower UTI Active Problems:   Type 2 diabetes mellitus without complication, with long-term current use of insulin (HCC)   Benign prostatic hyperplasia with urinary retention   Hypertension   History of stroke   Acute metabolic encephalopathy, persistent Plan to discharge to memory care assisted living. He completed IV antibiotics Rocephin for bacteremia on 10-22.  BPH with chronic urinary retention, Foley catheter in place. Tried voiding trial 9/27, but he continued to have issues with urine retention -Foley catheter replaced on 9/27 and he will need outpatient urology follow-up Continue Flomax  Mild euvolemic hyponatremia Serum sodium 134 Monitor for now Encourage oral intake   Group A strep  bacteremia -Plan to treat with 10-day course of antibiotics Completed Rocephin on 01/03/2021. -Can switch to oral cefdinir if needed on discharge -No need to evaluate further for endocarditis -Discussed case with Dr. Gale Journey with ID -Repeat blood cultures ordered on 9/29 with no growth noted   Type 2 diabetes -DYS 2 diet -SSI   History of stroke -Continue aspirin -DYS 2 diet  Dementia -Noted atrophy with microvascular ischemia on CT head   History of NSTEMI -Continue aspirin, Lipitor, beta-blocker     DVT prophylaxis: Lovenox subcu daily Code Status: Full Family Communication: Discussed with brother on phone 9/24 Disposition Plan:  Status is: Inpatient   Remains inpatient appropriate because:IV treatments appropriate due to intensity of illness or inability to take PO and Inpatient level of care appropriate due to severity of illness   Dispo: The patient is from: SNF              Anticipated d/c is to: ALF              Patient currently is not medically stable to d/c.              Difficult to place patient No   Consultants:  Discussed with ID   Procedures:  See below  Antimicrobials:  Anti-infectives (From admission, onward)    Start     Dose/Rate Route Frequency Ordered Stop   12/27/20 0000  cefdinir (OMNICEF) 300 MG capsule        300 mg Oral 2 times daily 12/27/20 1037 01/04/21 2359   12/26/20 1600  cefTRIAXone (ROCEPHIN) 2 g in sodium chloride 0.9 %  100 mL IVPB        2 g 200 mL/hr over 30 Minutes Intravenous Every 24 hours 12/25/20 2234 01/03/21 1651   12/25/20 1800  cefTRIAXone (ROCEPHIN) 1 g in sodium chloride 0.9 % 100 mL IVPB        1 g 200 mL/hr over 30 Minutes Intravenous  Once 12/25/20 1755 12/25/20 1847   12/25/20 1515  cefTRIAXone (ROCEPHIN) 1 g in sodium chloride 0.9 % 100 mL IVPB        1 g 200 mL/hr over 30 Minutes Intravenous  Once 12/25/20 1510 12/25/20 1635   12/25/20 0000  cephALEXin (KEFLEX) 500 MG capsule  Status:  Discontinued        500 mg  Oral 4 times daily 12/25/20 1533 12/27/20        Objective: Vitals:   01/04/21 0600 01/04/21 0850 01/04/21 0852 01/04/21 1330  BP: 137/85 107/74 107/74 106/61  Pulse: 80 66 66 63  Resp: 18 16 16 18   Temp: (!) 97.5 F (36.4 C) 97.7 F (36.5 C) 97.7 F (36.5 C) (!) 97.4 F (36.3 C)  TempSrc: Oral Axillary Oral Oral  SpO2: 100% 98% 98% 95%  Weight:      Height:        Intake/Output Summary (Last 24 hours) at 01/04/2021 1601 Last data filed at 01/04/2021 1130 Gross per 24 hour  Intake 720 ml  Output 1200 ml  Net -480 ml   Filed Weights   12/25/20 2058  Weight: 76.5 kg    Examination:  General exam: Alert, confused, in no acute distress. Respiratory system: Clear to auscultation with no wheezes or rales.  Cardiovascular system: Regular rate and rhythm no rubs or gallops. Gastrointestinal system: Soft nontender normal bowel sounds present. Central nervous system: Alert and awake Extremities: No edema Skin: No significant lesions noted. Psychiatry: Mood is appropriate for condition and setting.   Data Reviewed: I have personally reviewed following labs and imaging studies  CBC: Recent Labs  Lab 12/31/20 0511 01/01/21 0543 01/02/21 0527 01/03/21 0403 01/04/21 0525  WBC 8.7 8.6 8.0 8.0 9.0  NEUTROABS 4.7 5.4 4.4 4.4 4.7  HGB 12.4* 12.1* 11.3* 11.9* 11.9*  HCT 38.8* 36.8* 34.8* 37.3* 37.1*  MCV 90.2 89.8 91.6 91.4 92.3  PLT 342 297 301 312 121   Basic Metabolic Panel: Recent Labs  Lab 12/31/20 0511 01/01/21 0543 01/02/21 0527 01/03/21 0403 01/04/21 0525  NA 133* 135 134* 134* 134*  K 3.5 3.7 3.7 3.7 3.9  CL 99 102 102 102 101  CO2 24 23 25 25 25   GLUCOSE 189* 209* 205* 178* 179*  BUN 10 11 9 9 12   CREATININE 1.11 1.09 1.07 1.09 1.23  CALCIUM 8.8* 8.8* 8.4* 8.6* 8.8*  MG 2.0 2.0 2.0 2.0 1.9  PHOS 3.4 3.3 3.0 3.0 2.8   GFR: Estimated Creatinine Clearance: 54.4 mL/min (by C-G formula based on SCr of 1.23 mg/dL). Liver Function Tests: Recent Labs   Lab 12/31/20 0511 01/01/21 0543 01/02/21 0527 01/03/21 0403 01/04/21 0525  AST 25 19 17 18 18   ALT 34 27 24 23 20   ALKPHOS 86 83 82 82 83  BILITOT 0.6 0.6 0.9 0.7 0.9  PROT 7.1 6.9 6.9 6.8 7.0  ALBUMIN 3.3* 3.1* 3.1* 3.1* 3.2*   No results for input(s): LIPASE, AMYLASE in the last 168 hours. No results for input(s): AMMONIA in the last 168 hours. Coagulation Profile: No results for input(s): INR, PROTIME in the last 168 hours. Cardiac Enzymes: No results for  input(s): CKTOTAL, CKMB, CKMBINDEX, TROPONINI in the last 168 hours. BNP (last 3 results) No results for input(s): PROBNP in the last 8760 hours. HbA1C: No results for input(s): HGBA1C in the last 72 hours. CBG: Recent Labs  Lab 01/03/21 1119 01/03/21 1621 01/03/21 2125 01/04/21 0723 01/04/21 1108  GLUCAP 196* 168* 170* 167* 120*   Lipid Profile: No results for input(s): CHOL, HDL, LDLCALC, TRIG, CHOLHDL, LDLDIRECT in the last 72 hours. Thyroid Function Tests: No results for input(s): TSH, T4TOTAL, FREET4, T3FREE, THYROIDAB in the last 72 hours. Anemia Panel: No results for input(s): VITAMINB12, FOLATE, FERRITIN, TIBC, IRON, RETICCTPCT in the last 72 hours. Sepsis Labs: No results for input(s): PROCALCITON, LATICACIDVEN in the last 168 hours.   Recent Results (from the past 240 hour(s))  Resp Panel by RT-PCR (Flu A&B, Covid) Nasopharyngeal Swab     Status: None   Collection Time: 12/25/20  4:15 PM   Specimen: Nasopharyngeal Swab; Nasopharyngeal(NP) swabs in vial transport medium  Result Value Ref Range Status   SARS Coronavirus 2 by RT PCR NEGATIVE NEGATIVE Final    Comment: (NOTE) SARS-CoV-2 target nucleic acids are NOT DETECTED.  The SARS-CoV-2 RNA is generally detectable in upper respiratory specimens during the acute phase of infection. The lowest concentration of SARS-CoV-2 viral copies this assay can detect is 138 copies/mL. A negative result does not preclude SARS-Cov-2 infection and should not be  used as the sole basis for treatment or other patient management decisions. A negative result may occur with  improper specimen collection/handling, submission of specimen other than nasopharyngeal swab, presence of viral mutation(s) within the areas targeted by this assay, and inadequate number of viral copies(<138 copies/mL). A negative result must be combined with clinical observations, patient history, and epidemiological information. The expected result is Negative.  Fact Sheet for Patients:  EntrepreneurPulse.com.au  Fact Sheet for Healthcare Providers:  IncredibleEmployment.be  This test is no t yet approved or cleared by the [place] FDA and  has been authorized for detection and/or diagnosis of SARS-CoV-2 by FDA under an Emergency Use Authorization (EUA). This EUA will remain  in effect (meaning this test can be used) for the duration of the COVID-19 declaration under Section 564(b)(1) of the Act, 21 U.S.C.section 360bbb-3(b)(1), unless the authorization is terminated  or revoked sooner.       Influenza A by PCR NEGATIVE NEGATIVE Final   Influenza B by PCR NEGATIVE NEGATIVE Final    Comment: (NOTE) The Xpert Xpress SARS-CoV-2/FLU/RSV plus assay is intended as an aid in the diagnosis of influenza from Nasopharyngeal swab specimens and should not be used as a sole basis for treatment. Nasal washings and aspirates are unacceptable for Xpert Xpress SARS-CoV-2/FLU/RSV testing.  Fact Sheet for Patients: EntrepreneurPulse.com.au  Fact Sheet for Healthcare Providers: IncredibleEmployment.be  This test is not yet approved or cleared by the [place] FDA and has been authorized for detection and/or diagnosis of SARS-CoV-2 by FDA under an Emergency Use Authorization (EUA). This EUA will remain in effect (meaning this test can be used) for the duration of the COVID-19 declaration under Section  564(b)(1) of the Act, 21 U.S.C. section 360bbb-3(b)(1), unless the authorization is terminated or revoked.  Performed at [facility], [address], [place], [place] [address]   Culture, blood (Routine X 2) w Reflex to ID Panel     Status: None   Collection Time: 12/25/20  4:24 PM   Specimen: BLOOD LEFT HAND  Result Value Ref Range Status   Specimen Description BLOOD LEFT  HAND  Final   Special Requests   Final    BOTTLES DRAWN AEROBIC AND ANAEROBIC Blood Culture results may not be optimal due to an excessive volume of blood received in culture bottles   Culture   Final    NO GROWTH 5 DAYS Performed at [facility], [address], [place], [place] [address]    Report Status 12/30/2020 FINAL  Final  Culture, blood (Routine X 2) w Reflex to ID Panel     Status: None (Preliminary result)   Collection Time: 12/31/20  5:19 PM   Specimen: BLOOD RIGHT HAND  Result Value Ref Range Status   Specimen Description BLOOD RIGHT HAND  Final   Special Requests   Final    Blood Culture results may not be optimal due to an excessive volume of blood received in culture bottles BOTTLES DRAWN AEROBIC AND ANAEROBIC   Culture   Final    NO GROWTH 4 DAYS Performed at [facility], [address], [place], [place] [address]    Report Status PENDING  Incomplete  Culture, blood (Routine X 2) w Reflex to ID Panel     Status: None (Preliminary result)   Collection Time: 12/31/20  5:19 PM   Specimen: BLOOD LEFT ARM  Result Value Ref Range Status   Specimen Description BLOOD LEFT ARM  Final   Special Requests   Final    Blood Culture results may not be optimal due to an excessive volume of blood received in culture bottles BOTTLES DRAWN AEROBIC AND ANAEROBIC   Culture   Final    NO GROWTH 4 DAYS Performed at [facility], [address] [place], [place] [address]    Report Status PENDING  Incomplete         Radiology Studies: No results found.      Scheduled Meds:  amLODipine  10 mg Oral  Daily   aspirin  81 mg Oral Daily   atorvastatin  40 mg Oral q1800   Chlorhexidine Gluconate Cloth  6 each Topical Daily   enoxaparin (LOVENOX) injection  40 mg Subcutaneous Q24H   haloperidol  0.5 mg Oral BID   insulin aspart  0-15 Units Subcutaneous TID WC   metoprolol tartrate  25 mg Oral BID   tamsulosin  0.4 mg Oral QPC supper   Continuous Infusions:     LOS: 10 days    Time spent: 35 minutes    Kayleen Memos, DO [facility]  If 7PM-7AM, please contact night-coverage [email or web address] 01/04/2021, 4:01 PM

## 2021-01-04 NOTE — NC FL2 (Signed)
[place] LEVEL OF CARE SCREENING TOOL     IDENTIFICATION  Patient Name: Albert Garrison Birthdate: 07-25-1945 Sex: male Admission Date (Current Location): 12/25/2020  [facility] and [place] Number:  [employer] and Address:  [place] [address] [place]      Provider Number: [id number]  Attending Physician Name and Address:  Kayleen Memos, DO  Relative Name and Phone Number:  Kadin, Canipe (Brother)   [phone]    Current Level of Care: Hospital Recommended Level of Care: [place], Memory Care Prior Approval Number:    Date Approved/Denied:   PASRR Number: [id number]  Discharge Plan: Domiciliary (Rest home)    Current Diagnoses: Patient Active Problem List   Diagnosis Date Noted   Acute lower UTI 12/25/2020   Dysphagia 12/22/2020   Acute hypoxemic respiratory failure ([place]) 11/30/2020   Shock circulatory ([place]) 11/30/2020   Cardiac arrest ([place]) 11/30/2020   Other specified cardiac arrhythmias--- sleep apnea- arrhythmias when he desaturates  11/30/2020   Endotracheally intubated 11/30/2020   SBO (small bowel obstruction) ([place]) 11/28/2020   History of stroke 08/24/2017   Altered mental status 08/23/2017   Sleep apnea 08/23/2017   Elevated troponin 08/23/2017   Abnormal CT of the head 08/23/2017   Hypertension 08/23/2017   Type 2 diabetes mellitus ([place]) 08/23/2017   Syncope 08/23/2017   S/P carpal tunnel release right 07/13/17 07/27/2017   AKI (acute kidney injury) ([place]) 11/29/2015   Lower GI bleed 11/28/2015   Rectal bleed 11/28/2015   Rectal bleeding 11/28/2015   Benign prostatic hyperplasia with urinary retention 01/20/2015   Urinary retention 05/21/2014   ARF (acute renal failure) ([place]) 03/26/2014   Type 2 diabetes mellitus without complication, with long-term current use of insulin ([place]) 03/26/2014   UTI (lower urinary tract infection) 03/24/2014   Hyponatremia 03/24/2014   Acute  encephalopathy 03/24/2014    Orientation RESPIRATION BLADDER Height & Weight     Self, Time, Situation, Place  Normal Incontinent Weight: 168 lb 10.4 oz (76.5 kg) Height:  5\' 11"  (180.3 cm)  BEHAVIORAL SYMPTOMS/MOOD NEUROLOGICAL BOWEL NUTRITION STATUS      Continent Diet (DIET DYS 2 Room service appropriate? Yes; Fluid consistency: Thin)  AMBULATORY STATUS COMMUNICATION OF NEEDS Skin   Limited Assist Verbally Other (Comment) (Rash Left Groin)                       Personal Care Assistance Level of Assistance    Bathing Assistance: Limited assistance Feeding assistance: Independent Dressing Assistance: Limited assistance     Functional Limitations Info  Sight, Hearing, Speech Sight Info: Adequate Hearing Info: Impaired Speech Info: Adequate    SPECIAL CARE FACTORS FREQUENCY  PT (By licensed PT), OT (By licensed OT)     PT Frequency: 3x/week OT Frequency: 3x/week            Contractures Contractures Info: Not present    Additional Factors Info  Code Status, Allergies, Insulin Sliding Scale Code Status Info: Full Allergies Info: n/a   Insulin Sliding Scale Info: CBG 70 - 120: 0 units    CBG 121 - 150: 2 units    CBG 151 - 200: 3 units    CBG 201 - 250: 5 units    CBG 251 - 300: 8 units    CBG 301 - 350: 11 units    CBG 351 - 400: 15 units       Current Medications (01/04/2021):  This  is the current hospital active medication list Current Facility-Administered Medications  Medication Dose Route Frequency Provider Last Rate Last Admin   acetaminophen (TYLENOL) tablet 650 mg  650 mg Oral Q6H PRN Adefeso, Oladapo, DO   650 mg at 01/03/21 1049   amLODipine (NORVASC) tablet 10 mg  10 mg Oral Daily Truett Mainland, DO   10 mg at 01/04/21 5462   aspirin chewable tablet 81 mg  81 mg Oral Daily Truett Mainland, DO   81 mg at 01/04/21 0854   atorvastatin (LIPITOR) tablet 40 mg  40 mg Oral V0350 Truett Mainland, DO   40 mg at 01/03/21 1721   Chlorhexidine Gluconate  Cloth 2 % PADS 6 each  6 each Topical Daily Truett Mainland, DO   6 each at 01/04/21 0939   enoxaparin (LOVENOX) injection 40 mg  40 mg Subcutaneous Q24H Manuella Ghazi, Pratik D, DO   40 mg at 01/03/21 1619   haloperidol (HALDOL) tablet 0.5 mg  0.5 mg Oral BID Heath Lark D, DO   0.5 mg at 01/04/21 0938   haloperidol lactate (HALDOL) injection 1-2 mg  1-2 mg Intravenous Q6H PRN Allie Bossier, MD   2 mg at 01/03/21 0155   insulin aspart (novoLOG) injection 0-15 Units  0-15 Units Subcutaneous TID WC Truett Mainland, DO   3 Units at 01/04/21 0857   metoprolol tartrate (LOPRESSOR) tablet 25 mg  25 mg Oral BID Truett Mainland, DO   25 mg at 01/04/21 0854   ondansetron (ZOFRAN) tablet 4 mg  4 mg Oral Q6H PRN Truett Mainland, DO       Or   ondansetron [facility]) injection 4 mg  4 mg Intravenous Q6H PRN Truett Mainland, DO       phenol (CHLORASEPTIC) mouth spray 1 spray  1 spray Mouth/Throat PRN Manuella Ghazi, Pratik D, DO       tamsulosin (FLOMAX) capsule 0.4 mg  0.4 mg Oral QPC supper Truett Mainland, DO   0.4 mg at 01/03/21 1722     Discharge Medications: Please see discharge summary for a list of discharge medications.  Relevant Imaging Results:  Relevant Lab Results:   Additional Information Pt SSN [id number]  Ihor Gully, LCSW

## 2021-01-04 NOTE — TOC Progression Note (Signed)
Transition of Care [facility]) - Progression Note    Patient Details  Name: Albert Garrison MRN: [id number] Date of Birth: 08-31-1945  [facility]) CM/SW Contact  Ihor Gully, LCSW Phone Number: 01/04/2021, 1:59 PM  Clinical Narrative:    Colletta [place] with APS/DSS indicated that she would start patient's Medicaid application today.  Additional referral sent to [facility].      Barriers to Discharge: Unsafe home situation  Expected Discharge Plan and Services   In-house Referral: Clinical Social Work       Expected Discharge Date: 12/27/20                                     Social Determinants of Health (SDOH) Interventions    Readmission Risk Interventions Readmission Risk Prevention Plan 01/01/2021  Transportation Screening Complete  PCP or Specialist Appt within 3-5 Days Complete  HRI or [place] Complete  Social Work Consult for [place] Planning/Counseling Complete  Palliative Care Screening Not Applicable  Medication Review Press photographer) Complete  Some recent data might be hidden

## 2021-01-04 NOTE — Care Management Important Message (Signed)
Important Message  Patient Details  Name: Albert Garrison MRN: [id number] Date of Birth: 1945-07-15   Medicare Important Message Given:  Yes     [name] 01/04/2021, 11:58 AM

## 2021-01-05 DIAGNOSIS — N390 Urinary tract infection, site not specified: Secondary | ICD-10-CM | POA: Diagnosis not present

## 2021-01-05 LAB — CULTURE, BLOOD (ROUTINE X 2)
Culture: NO GROWTH
Culture: NO GROWTH

## 2021-01-05 LAB — GLUCOSE, CAPILLARY
Glucose-Capillary: 161 mg/dL — ABNORMAL HIGH (ref 70–99)
Glucose-Capillary: 172 mg/dL — ABNORMAL HIGH (ref 70–99)
Glucose-Capillary: 131 mg/dL — ABNORMAL HIGH (ref 70–99)

## 2021-01-05 NOTE — TOC Progression Note (Signed)
Transition of Care [facility]) - Progression Note    Patient Details  Name: Albert Garrison MRN: [id number] Date of Birth: [birth year]  Transition of Care [facility]) CM/SW Contact  Ihor Gully, LCSW Phone Number: 01/05/2021, 10:45 AM  Clinical Narrative:    Juliann Pulse with [facility] assessed patient today. Juliann Pulse will follow-up with [place] at [place].     Barriers to Discharge: Unsafe home situation  Expected Discharge Plan and Services   In-house Referral: Clinical Social Work       Expected Discharge Date: 12/27/20                                     Social Determinants of Health (SDOH) Interventions    Readmission Risk Interventions Readmission Risk Prevention Plan 01/01/2021  Transportation Screening Complete  PCP or Specialist Appt within 3-5 Days Complete  HRI or [place] Complete  Social Work Consult for [place] Planning/Counseling Complete  Palliative Care Screening Not Applicable  Medication Review Press photographer) Complete  Some recent data might be hidden

## 2021-01-05 NOTE — Progress Notes (Signed)
Physical Therapy Treatment Patient Details Name: Albert Garrison MRN: [id number] DOB: November 01, 1945 Today's Date: 01/05/2021   History of Present Illness      PT Comments    Pt remains confused.  Mod I in activity level    Recommendations for follow up therapy are one component of a multi-disciplinary discharge planning process, led by the attending physician.  Recommendations may be updated based on patient status, additional functional criteria and insurance authorization.  Follow Up Recommendations        Equipment Recommendations       Recommendations for Other Services       Precautions / Restrictions       Mobility  Bed Mobility Overal bed mobility: Modified Independent                  Transfers Overall transfer level: Modified independent                  Ambulation/Gait Ambulation/Gait assistance: Modified independent (Device/Increase time) Gait Distance (Feet): 150 Feet Assistive device: Rolling walker (2 wheeled) Gait Pattern/deviations: Decreased step length - left;Decreased stride length         Stairs             Wheelchair Mobility    Modified Rankin (Stroke Patients Only)       Balance                                            Cognition Arousal/Alertness: Awake/alert Behavior During Therapy: WFL for tasks assessed/performed;Impulsive Overall Cognitive Status: No family/caregiver present to determine baseline cognitive functioning Area of Impairment: Awareness;Memory;Problem solving                   Current Attention Level: Sustained;Focused Memory: Decreased short-term memory                Exercises      General Comments        Pertinent Vitals/Pain Pain Assessment: No/denies pain    Home Living Family/patient expects to be discharged to:: Unsure Living Arrangements: Alone Available Help at Discharge: Family Type of Home: Apartment Home Access: Level entry   Home  Layout: One level Home Equipment: None      Prior Function Level of Independence: Independent      Comments: I with ADLs/IADLs without AD; drives; enjoys classic cars and going to church   PT Goals (current goals can now be found in the care plan section)      Frequency           PT Plan      Co-evaluation              AM-PAC PT "6 Clicks" Mobility   Outcome Measure                   End of Session Equipment Utilized During Treatment: Gait belt Activity Tolerance: Patient tolerated treatment well Patient left: in chair;with call bell/phone within reach;with chair alarm set Nurse Communication: Mobility status       Time: 0240-9735 PT Time Calculation (min) (ACUTE ONLY): 20 min  Charges:  $Gait Training: 8-22 mins                     Rayetta Humphrey, PT CLT [phone]  01/05/2021, 9:51 AM

## 2021-01-05 NOTE — Progress Notes (Signed)
PROGRESS NOTE    Albert Garrison  JOA:[id number] DOB: April 13, 1945 DOA: 12/25/2020 PCP: Caprice Renshaw, MD   Brief Narrative:   Albert Garrison is a 75 y.o. male with a history of type 2 diabetes, BPH, history of stroke, GERD, history of Cardiac Arrest.  The patient was recently admitted on 11/27/2020 and was discharged approximately 3 days prior to presentation from the hospital.  During that hospitalization, the patient went into cardiac arrest and had acute hypoxemic respiratory failure, he was intubated and ROSC was obtained after about 1 minute.  The patient now presents with hematuria.  He has been admitted with some acute metabolic encephalopathy in the setting of catheter associated UTI.  He continues to have some confusion, but he does not qualify for SNF and therefore is unsafe for discharge to home.  APS evaluation pending as well as guardianship.  He was treated for group A strep bacteremia and completed a 10-day course of Rocephin.  No noted agitation for the past 48 hours, labs and vital signs have been stable.  CSW is assisting with DC planning.  Plan for discharge to memory care assisted living facility.  01/05/2021: He was seen and examined this morning.  He was alert and pleasantly confused.  He had no new complaints.  Assessment & Plan:   Principal Problem:   Acute lower UTI Active Problems:   Type 2 diabetes mellitus without complication, with long-term current use of insulin (HCC)   Benign prostatic hyperplasia with urinary retention   Hypertension   History of stroke   Acute metabolic encephalopathy, persistent Plan to discharge to memory care assisted living facility. He completed 10-day course of IV antibiotics Rocephin for Group A strep bacteremia on 01/03/21.  BPH with chronic urinary retention, Foley catheter in place. Tried voiding trial 9/27, but he continued to have issues with urine retention -Foley catheter replaced on 9/27 and he will need outpatient urology  follow-up Continue Flomax Continue to monitor urine output.  Mild euvolemic hyponatremia Serum sodium 134 Monitor for now Encourage oral intake   Treated Group A strep bacteremia Completed Rocephin on 01/03/2021. -No need to evaluate further for endocarditis -Discussed case with Dr. Gale Journey with ID -Repeat blood cultures ordered on 9/29 with no growth final.   Type 2 diabetes with hyperglycemia Last hemoglobin A1c 8.6 on 11/27/20 -DYS 2 diet Continue insulin sliding scale.   History of stroke -Continue aspirin and Lipitor -DYS 2 diet  History of NSTEMI -Continue aspirin, Lipitor, beta-blocker     DVT prophylaxis: Lovenox subcu daily Code Status: Full Family Communication: Discussed with brother on phone 9/24 Disposition Plan:  Status is: Inpatient   Remains inpatient appropriate because:IV treatments appropriate due to intensity of illness or inability to take PO and Inpatient level of care appropriate due to severity of illness   Dispo: The patient is from: SNF              Anticipated d/c is to: ALF/memory care              Patient currently is not medically stable to d/c.              Difficult to place patient No   Consultants:  Discussed with ID   Procedures:  See below  Antimicrobials:  Anti-infectives (From admission, onward)    Start     Dose/Rate Route Frequency Ordered Stop   12/27/20 0000  cefdinir (OMNICEF) 300 MG capsule        300 mg  Oral 2 times daily 12/27/20 1037 01/04/21 2359   12/26/20 1600  cefTRIAXone (ROCEPHIN) 2 g in sodium chloride 0.9 % 100 mL IVPB        2 g 200 mL/hr over 30 Minutes Intravenous Every 24 hours 12/25/20 2234 01/03/21 1651   12/25/20 1800  cefTRIAXone (ROCEPHIN) 1 g in sodium chloride 0.9 % 100 mL IVPB        1 g 200 mL/hr over 30 Minutes Intravenous  Once 12/25/20 1755 12/25/20 1847   12/25/20 1515  cefTRIAXone (ROCEPHIN) 1 g in sodium chloride 0.9 % 100 mL IVPB        1 g 200 mL/hr over 30 Minutes Intravenous  Once  12/25/20 1510 12/25/20 1635   12/25/20 0000  cephALEXin (KEFLEX) 500 MG capsule  Status:  Discontinued        500 mg Oral 4 times daily 12/25/20 1533 12/27/20        Objective: Vitals:   01/04/21 0852 01/04/21 1330 01/04/21 2107 01/05/21 0601  BP: 107/74 106/61 (!) 115/95 (!) 134/56  Pulse: 66 63 72 60  Resp: 16 18 18 20   Temp: 97.7 F (36.5 C) (!) 97.4 F (36.3 C) 98.2 F (36.8 C) 97.7 F (36.5 C)  TempSrc: Oral Oral Oral Oral  SpO2: 98% 95% 94% 95%  Weight:      Height:        Intake/Output Summary (Last 24 hours) at 01/05/2021 1121 Last data filed at 01/05/2021 0400 Gross per 24 hour  Intake 1080 ml  Output 2250 ml  Net -1170 ml   Filed Weights   12/25/20 2058  Weight: 76.5 kg    Examination:  General exam: Well-developed well-nourished in no acute distress.  He is alert and pleasant.  No agitation. Respiratory system: Clear to auscultation with no wheezes or rales.  Cardiovascular system: Regular rate and rhythm no rubs or gallops.  Gastrointestinal system: Soft nontender normal bowel sounds present. Central nervous system: He is alert and awake. Extremities: No edema noted. Skin: No significant lesions noted. Psychiatry: Mood is appropriate for condition and setting.   Data Reviewed: I have personally reviewed following labs and imaging studies  CBC: Recent Labs  Lab 12/31/20 0511 01/01/21 0543 01/02/21 0527 01/03/21 0403 01/04/21 0525  WBC 8.7 8.6 8.0 8.0 9.0  NEUTROABS 4.7 5.4 4.4 4.4 4.7  HGB 12.4* 12.1* 11.3* 11.9* 11.9*  HCT 38.8* 36.8* 34.8* 37.3* 37.1*  MCV 90.2 89.8 91.6 91.4 92.3  PLT 342 297 301 312 235   Basic Metabolic Panel: Recent Labs  Lab 12/31/20 0511 01/01/21 0543 01/02/21 0527 01/03/21 0403 01/04/21 0525  NA 133* 135 134* 134* 134*  K 3.5 3.7 3.7 3.7 3.9  CL 99 102 102 102 101  CO2 24 23 25 25 25   GLUCOSE 189* 209* 205* 178* 179*  BUN 10 11 9 9 12   CREATININE 1.11 1.09 1.07 1.09 1.23  CALCIUM 8.8* 8.8* 8.4* 8.6* 8.8*   MG 2.0 2.0 2.0 2.0 1.9  PHOS 3.4 3.3 3.0 3.0 2.8   GFR: Estimated Creatinine Clearance: 54.4 mL/min (by C-G formula based on SCr of 1.23 mg/dL). Liver Function Tests: Recent Labs  Lab 12/31/20 0511 01/01/21 0543 01/02/21 0527 01/03/21 0403 01/04/21 0525  AST 25 19 17 18 18   ALT 34 27 24 23 20   ALKPHOS 86 83 82 82 83  BILITOT 0.6 0.6 0.9 0.7 0.9  PROT 7.1 6.9 6.9 6.8 7.0  ALBUMIN 3.3* 3.1* 3.1* 3.1* 3.2*   No results for  input(s): LIPASE, AMYLASE in the last 168 hours. No results for input(s): AMMONIA in the last 168 hours. Coagulation Profile: No results for input(s): INR, PROTIME in the last 168 hours. Cardiac Enzymes: No results for input(s): CKTOTAL, CKMB, CKMBINDEX, TROPONINI in the last 168 hours. BNP (last 3 results) No results for input(s): PROBNP in the last 8760 hours. HbA1C: No results for input(s): HGBA1C in the last 72 hours. CBG: Recent Labs  Lab 01/04/21 1108 01/04/21 1610 01/04/21 2131 01/05/21 0728 01/05/21 1055  GLUCAP 120* 166* 132* 161* 172*   Lipid Profile: No results for input(s): CHOL, HDL, LDLCALC, TRIG, CHOLHDL, LDLDIRECT in the last 72 hours. Thyroid Function Tests: No results for input(s): TSH, T4TOTAL, FREET4, T3FREE, THYROIDAB in the last 72 hours. Anemia Panel: No results for input(s): VITAMINB12, FOLATE, FERRITIN, TIBC, IRON, RETICCTPCT in the last 72 hours. Sepsis Labs: No results for input(s): PROCALCITON, LATICACIDVEN in the last 168 hours.   Recent Results (from the past 240 hour(s))  Culture, blood (Routine X 2) w Reflex to ID Panel     Status: None   Collection Time: 12/31/20  5:19 PM   Specimen: BLOOD RIGHT HAND  Result Value Ref Range Status   Specimen Description BLOOD RIGHT HAND  Final   Special Requests   Final    Blood Culture results may not be optimal due to an excessive volume of blood received in culture bottles BOTTLES DRAWN AEROBIC AND ANAEROBIC   Culture   Final    NO GROWTH 5 DAYS Performed at [facility], [address] [place], [place] [address]    Report Status 01/05/2021 FINAL  Final  Culture, blood (Routine X 2) w Reflex to ID Panel     Status: None   Collection Time: 12/31/20  5:19 PM   Specimen: BLOOD LEFT ARM  Result Value Ref Range Status   Specimen Description BLOOD LEFT ARM  Final   Special Requests   Final    Blood Culture results may not be optimal due to an excessive volume of blood received in culture bottles BOTTLES DRAWN AEROBIC AND ANAEROBIC   Culture   Final    NO GROWTH 5 DAYS Performed at [facility], [address], [place], [place] [address]    Report Status 01/05/2021 FINAL  Final         Radiology Studies: No results found.      Scheduled Meds:  amLODipine  10 mg Oral Daily   aspirin  81 mg Oral Daily   atorvastatin  40 mg Oral q1800   Chlorhexidine Gluconate Cloth  6 each Topical Daily   enoxaparin (LOVENOX) injection  40 mg Subcutaneous Q24H   haloperidol  0.5 mg Oral BID   insulin aspart  0-15 Units Subcutaneous TID WC   metoprolol tartrate  25 mg Oral BID   tamsulosin  0.4 mg Oral QPC supper   Continuous Infusions:     LOS: 11 days    Time spent: 35 minutes    Kayleen Memos, DO [facility]  If 7PM-7AM, please contact night-coverage [email or web address] 01/05/2021, 11:21 AM

## 2021-01-06 DIAGNOSIS — Z794 Long term (current) use of insulin: Secondary | ICD-10-CM

## 2021-01-06 DIAGNOSIS — N401 Enlarged prostate with lower urinary tract symptoms: Secondary | ICD-10-CM | POA: Diagnosis not present

## 2021-01-06 DIAGNOSIS — N390 Urinary tract infection, site not specified: Secondary | ICD-10-CM | POA: Diagnosis not present

## 2021-01-06 DIAGNOSIS — R338 Other retention of urine: Secondary | ICD-10-CM | POA: Diagnosis not present

## 2021-01-06 DIAGNOSIS — E119 Type 2 diabetes mellitus without complications: Secondary | ICD-10-CM | POA: Diagnosis not present

## 2021-01-06 LAB — GLUCOSE, CAPILLARY
Glucose-Capillary: 100 mg/dL — ABNORMAL HIGH (ref 70–99)
Glucose-Capillary: 140 mg/dL — ABNORMAL HIGH (ref 70–99)
Glucose-Capillary: 147 mg/dL — ABNORMAL HIGH (ref 70–99)
Glucose-Capillary: 148 mg/dL — ABNORMAL HIGH (ref 70–99)
Glucose-Capillary: 159 mg/dL — ABNORMAL HIGH (ref 70–99)
Glucose-Capillary: 209 mg/dL — ABNORMAL HIGH (ref 70–99)

## 2021-01-06 NOTE — Plan of Care (Signed)

## 2021-01-06 NOTE — TOC Progression Note (Signed)
Transition of Care [facility]) - Progression Note    Patient Details  Name: Albert Garrison MRN: [id number] Date of Birth: 1945-09-15  Transition of Care [facility]) CM/SW Contact  Ihor Gully, LCSW Phone Number: 01/06/2021, 12:46 PM  Clinical Narrative:    Message left for [facility] to follow up on admission.      Barriers to Discharge: Unsafe home situation  Expected Discharge Plan and Services   In-house Referral: Clinical Social Work       Expected Discharge Date: 12/27/20                                     Social Determinants of Health (SDOH) Interventions    Readmission Risk Interventions Readmission Risk Prevention Plan 01/01/2021  Transportation Screening Complete  PCP or Specialist Appt within 3-5 Days Complete  HRI or [place] Complete  Social Work Consult for [place] Planning/Counseling Complete  Palliative Care Screening Not Applicable  Medication Review [employer]) Complete  Some recent data might be hidden

## 2021-01-06 NOTE — Progress Notes (Signed)
Physical Therapy Treatment Patient Details Name: Albert Garrison MRN: [id number] DOB: Mar 12, 1946 Today's Date: 01/06/2021   History of Present Illness Albert Garrison is a 75 y.o. male with a history of type 2 diabetes, stage III chronic kidney disease, BPH, history of stroke, GERD, history of Cardiac Arrest.  The patient was recently admitted on 11/27/2020 and was discharged approximately 3 days ago from the hospital.  During that hospitalization, the patient went into cardiac arrest and had acute hypoxemic respiratory failure, he was intubated and ROSC was obtained after about 1 minute.  The patient presented this time with acute metabolic encephalopathy suspected to be related to UTI as well as some noted hematuria on presentation.  His urine cultures have demonstrated no growth, however he has had group A strep bacteremia noted and case was discussed with ID with recommendations to remain on oral cefdinir for 8 more days to complete 10-day course of treatment.  He has had improvement in leukocytosis as well as his mentation and has remained afebrile during the course of the    PT Comments    Pt confused on situation, location and unable to state DOB, is aware of name and is in hospital.  Pt requested a shave through session.  Mod I with bed mobility and gait.  Used RW for stability with standing and gait.  No LOB through session, cueing to increase stride length to normalize gait.  EOS pt left in chair with call bell within reach and chair alarm set.  RN aware of status.     Recommendations for follow up therapy are one component of a multi-disciplinary discharge planning process, led by the attending physician.  Recommendations may be updated based on patient status, additional functional criteria and insurance authorization.  Follow Up Recommendations  Supervision for mobility/OOB;Supervision - Intermittent     Equipment Recommendations  Rolling walker with 5" wheels    Recommendations for  Other Services Rehab consult     Precautions / Restrictions Precautions Precautions: None Restrictions Weight Bearing Restrictions: No     Mobility  Bed Mobility Overal bed mobility: Modified Independent Bed Mobility: Supine to Sit     Supine to sit: Supervision     General bed mobility comments: increased time    Transfers Overall transfer level: Modified independent Equipment used: Rolling walker (2 wheeled) Transfers: Sit to/from Stand Sit to Stand: Min guard         General transfer comment: cueing for hand placement  Ambulation/Gait Ambulation/Gait assistance: Modified independent (Device/Increase time) Gait Distance (Feet): 180 Feet Assistive device: Rolling walker (2 wheeled) Gait Pattern/deviations: Decreased step length - left;Decreased stride length Gait velocity: Decreased   General Gait Details: RW for safety, slow labored movements, no LOB, cueing to improve stride length   Stairs             Wheelchair Mobility    Modified Rankin (Stroke Patients Only)       Balance                                            Cognition Arousal/Alertness: Awake/alert Behavior During Therapy: WFL for tasks assessed/performed;Impulsive Overall Cognitive Status: No family/caregiver present to determine baseline cognitive functioning Area of Impairment: Awareness;Memory;Problem solving                 Orientation Level: Situation Current Attention Level: Sustained;Focused Memory: Decreased short-term  memory Following Commands: Follows multi-step commands with increased time Safety/Judgement: Decreased awareness of safety     General Comments: Pt able to recall name and that he is in hosptial, unable to answer DOB or situation      Exercises      General Comments        Pertinent Vitals/Pain Pain Assessment: No/denies pain    Home Living                      Prior Function            PT Goals  (current goals can now be found in the care plan section)      Frequency    Min 3X/week      PT Plan Current plan remains appropriate    Co-evaluation              AM-PAC PT "6 Clicks" Mobility   Outcome Measure  Help needed turning from your back to your side while in a flat bed without using bedrails?: None Help needed moving from lying on your back to sitting on the side of a flat bed without using bedrails?: None Help needed moving to and from a bed to a chair (including a wheelchair)?: None Help needed standing up from a chair using your arms (e.g., wheelchair or bedside chair)?: None Help needed to walk in hospital room?: A Little Help needed climbing 3-5 steps with a railing? : A Lot 6 Click Score: 21    End of Session Equipment Utilized During Treatment: Gait belt Activity Tolerance: Patient tolerated treatment well Patient left: in chair;with call bell/phone within reach;with chair alarm set Nurse Communication: Mobility status PT Visit Diagnosis: Unsteadiness on feet (R26.81);Muscle weakness (generalized) (M62.81)     Time: 0211-1735 PT Time Calculation (min) (ACUTE ONLY): 20 min  Charges:  $Gait Training: 8-22 mins                    [name], LPTA/CLT; CBIS [phone]    [name] 01/06/2021, 12:15 PM

## 2021-01-06 NOTE — Progress Notes (Signed)
PROGRESS NOTE  Albert Garrison DOB: 1945/06/02 DOA: 12/25/2020 PCP: Caprice Renshaw, MD  Brief History:  Albert Garrison is a 75 y.o. male with a history of type 2 diabetes, BPH, history of stroke, GERD, history of Cardiac Arrest.  The patient was recently admitted on 11/27/2020 and was discharged approximately 3 days prior to presentation from the hospital.  During that hospitalization, the patient went into cardiac arrest and had acute hypoxemic respiratory failure, he was intubated and ROSC was obtained after about 1 minute.  The patient now presents with hematuria.  He has been admitted with some acute metabolic encephalopathy in the setting of catheter associated UTI.  He continues to have some confusion, but he does not qualify for SNF and therefore is unsafe for discharge to home.  APS evaluation pending as well as guardianship.  He was treated for group A strep bacteremia and completed a 10-day course of Rocephin.  No noted agitation for the past 48 hours, labs and vital signs have been stable.  CSW is assisting with DC planning.  Plan for discharge to memory care assisted living facility.   01/05/2021: He was seen and examined this morning.  He was alert and pleasantly confused.  He had no new complaints.  01/06/21:  remains pleasantly confused.  No complaints.  Remains medically stable  Assessment/Plan: Acute metabolic encephalopathy, persistent Plan to discharge to memory care assisted living facility. He completed 10-day course of IV antibiotics Rocephin for Group A strep bacteremia and UTI on 01/03/21. -now remains pleasantly confused--suspect he is near baseline   BPH with chronic urinary retention, Foley catheter in place. Tried voiding trial 9/27, but he continued to have issues with urine retention -Foley catheter replaced on 9/27 and he will need outpatient urology follow-up Continue Flomax Continue to monitor urine output.   Mild euvolemic  hyponatremia Serum sodium 134 Monitor for now Encourage oral intake   Treated Group A strep bacteremia Completed Rocephin on 01/03/2021. -No need to evaluate further for endocarditis -Discussed case with Dr. Gale Journey with ID -Repeat blood cultures ordered on 9/29 with no growth final.   Type 2 diabetes with hyperglycemia Last hemoglobin A1c 8.6 on 11/27/20 -DYS 2 diet Continue insulin sliding scale.   History of stroke -Continue aspirin and Lipitor -DYS 2 diet   History of NSTEMI -Continue aspirin, Lipitor, beta-blocker        Status is: Inpatient  Remains inpatient appropriate because:Inpatient level of care appropriate due to severity of illness  Dispo:  Patient From: Home  Planned Disposition: ALF  Medically stable for discharge: No          Family Communication: no  Family at bedside  Consultants:  none  Code Status:  FULL  DVT Prophylaxis: [place] Lovenox   Procedures: As Listed in Progress Note Above  Antibiotics: None        Subjective: Patient denies fevers, chills, headache, chest pain, dyspnea, nausea, vomiting, diarrhea, abdominal pain, dysuria, hematuria, hematochezia, and melena.   Objective: Vitals:   01/05/21 2108 01/05/21 2109 01/06/21 0544 01/06/21 1458  BP: 127/77 127/77 119/72 105/64  Pulse: 64 64 73 (!) 58  Resp: 18 18 18 20   Temp:  98.4 F (36.9 C) 98.2 F (36.8 C) 97.7 F (36.5 C)  TempSrc:  Oral Oral Oral  SpO2: 96% 95% 92% 98%  Weight:      Height:        Intake/Output Summary (Last 24 hours)  at 01/06/2021 1758 Last data filed at 01/06/2021 0615 Gross per 24 hour  Intake 240 ml  Output 1600 ml  Net -1360 ml   Weight change:  Exam:  General:  Pt is alert, follows commands appropriately, not in acute distress HEENT: No icterus, No thrush, No neck mass, [place]/AT Cardiovascular: RRR, S1/S2, no rubs, no gallops Respiratory: CTA bilaterally, no wheezing, no crackles, no rhonchi Abdomen: Soft/+BS, non tender, non  distended, no guarding Extremities: No edema, No lymphangitis, No petechiae, No rashes, no synovitis   Data Reviewed: I have personally reviewed following labs and imaging studies Basic Metabolic Panel: Recent Labs  Lab 12/31/20 0511 01/01/21 0543 01/02/21 0527 01/03/21 0403 01/04/21 0525  NA 133* 135 134* 134* 134*  K 3.5 3.7 3.7 3.7 3.9  CL 99 102 102 102 101  CO2 24 23 25 25 25   GLUCOSE 189* 209* 205* 178* 179*  BUN 10 11 9 9 12   CREATININE 1.11 1.09 1.07 1.09 1.23  CALCIUM 8.8* 8.8* 8.4* 8.6* 8.8*  MG 2.0 2.0 2.0 2.0 1.9  PHOS 3.4 3.3 3.0 3.0 2.8   Liver Function Tests: Recent Labs  Lab 12/31/20 0511 01/01/21 0543 01/02/21 0527 01/03/21 0403 01/04/21 0525  AST 25 19 17 18 18   ALT 34 27 24 23 20   ALKPHOS 86 83 82 82 83  BILITOT 0.6 0.6 0.9 0.7 0.9  PROT 7.1 6.9 6.9 6.8 7.0  ALBUMIN 3.3* 3.1* 3.1* 3.1* 3.2*   No results for input(s): LIPASE, AMYLASE in the last 168 hours. No results for input(s): AMMONIA in the last 168 hours. Coagulation Profile: No results for input(s): INR, PROTIME in the last 168 hours. CBC: Recent Labs  Lab 12/31/20 0511 01/01/21 0543 01/02/21 0527 01/03/21 0403 01/04/21 0525  WBC 8.7 8.6 8.0 8.0 9.0  NEUTROABS 4.7 5.4 4.4 4.4 4.7  HGB 12.4* 12.1* 11.3* 11.9* 11.9*  HCT 38.8* 36.8* 34.8* 37.3* 37.1*  MCV 90.2 89.8 91.6 91.4 92.3  PLT 342 297 301 312 365   Cardiac Enzymes: No results for input(s): CKTOTAL, CKMB, CKMBINDEX, TROPONINI in the last 168 hours. BNP: Invalid input(s): POCBNP CBG: Recent Labs  Lab 01/05/21 2108 01/06/21 0015 01/06/21 0758 01/06/21 1231 01/06/21 1656  GLUCAP 148* 147* 159* 209* 100*   HbA1C: No results for input(s): HGBA1C in the last 72 hours. Urine analysis:    Component Value Date/Time   COLORURINE BROWN (A) 12/25/2020 1238   APPEARANCEUR HAZY (A) 12/25/2020 1238   LABSPEC 1.015 12/25/2020 1238   PHURINE 5.0 12/25/2020 1238   GLUCOSEU >=500 (A) 12/25/2020 1238   HGBUR LARGE (A)  12/25/2020 1238   BILIRUBINUR NEGATIVE 12/25/2020 1238   KETONESUR NEGATIVE 12/25/2020 1238   PROTEINUR 100 (A) 12/25/2020 1238   UROBILINOGEN 0.2 03/24/2014 1320   NITRITE NEGATIVE 12/25/2020 1238   LEUKOCYTESUR MODERATE (A) 12/25/2020 1238   Sepsis Labs: @LABRCNTIP (procalcitonin:4,lacticidven:4) ) Recent Results (from the past 240 hour(s))  Culture, blood (Routine X 2) w Reflex to ID Panel     Status: None   Collection Time: 12/31/20  5:19 PM   Specimen: BLOOD RIGHT HAND  Result Value Ref Range Status   Specimen Description BLOOD RIGHT HAND  Final   Special Requests   Final    Blood Culture results may not be optimal due to an excessive volume of blood received in culture bottles BOTTLES DRAWN AEROBIC AND ANAEROBIC   Culture   Final    NO GROWTH 5 DAYS Performed at [facility], [address], [place],  [place] [address]    Report Status 01/05/2021 FINAL  Final  Culture, blood (Routine X 2) w Reflex to ID Panel     Status: None   Collection Time: 12/31/20  5:19 PM   Specimen: BLOOD LEFT ARM  Result Value Ref Range Status   Specimen Description BLOOD LEFT ARM  Final   Special Requests   Final    Blood Culture results may not be optimal due to an excessive volume of blood received in culture bottles BOTTLES DRAWN AEROBIC AND ANAEROBIC   Culture   Final    NO GROWTH 5 DAYS Performed at [facility], [address], [place], [place] [address]    Report Status 01/05/2021 FINAL  Final     Scheduled Meds:  amLODipine  10 mg Oral Daily   aspirin  81 mg Oral Daily   atorvastatin  40 mg Oral q1800   Chlorhexidine Gluconate Cloth  6 each Topical Daily   enoxaparin (LOVENOX) injection  40 mg Subcutaneous Q24H   haloperidol  0.5 mg Oral BID   insulin aspart  0-15 Units Subcutaneous TID WC   metoprolol tartrate  25 mg Oral BID   tamsulosin  0.4 mg Oral QPC supper   Continuous Infusions:  Procedures/Studies: DG Chest Port 1 View  Result Date: 12/25/2020 CLINICAL DATA:   Hematuria and fever. EXAM: PORTABLE CHEST 1 VIEW COMPARISON:  December 01, 2020 FINDINGS: Mild atelectasis and/or infiltrate is seen within the bilateral lung bases. There is no evidence of a pleural effusion or pneumothorax. The heart size and mediastinal contours are within normal limits. Radiopaque fusion plates and screws are seen overlying the cervical spine. IMPRESSION: Mild bibasilar atelectasis and/or infiltrate. Electronically Signed   By: Virgina Norfolk M.D.   On: 12/25/2020 16:56   DG Swallowing Func-Speech Pathology  Result Date: 12/17/2020 Table formatting from the original result was not included. Objective Swallowing Evaluation: Type of Study: MBS-Modified Barium Swallow Study  Patient Details Name: Albert Garrison MRN: [id number] Date of Birth: [birth year] Today's Date: 12/17/2020 Time: SLP Start Time (ACUTE ONLY): 0347 -SLP Stop Time (ACUTE ONLY): 4259 SLP Time Calculation (min) (ACUTE ONLY): 15 min Past Medical History: Past Medical History: Diagnosis Date  Arthritis   BPH (benign prostatic hyperplasia)   CKD (chronic kidney disease), stage II   Complicated UTI (urinary tract infection) 03/2014  Depression   GERD (gastroesophageal reflux disease)   History of pneumonia   History of stroke   HOH (hard of hearing)   Hypertension   Hyponatremia   Lower GI bleed 2017  a. ? due to polyp.  NSVT (nonsustained ventricular tachycardia) (HCC)   Rheumatic fever   Childhood  Sleep apnea   Does not use CPAP  Type 2 diabetes mellitus ([place])  Past Surgical History: Past Surgical History: Procedure Laterality Date  CARPAL TUNNEL RELEASE Left 2010  CARPAL TUNNEL RELEASE Right 07/13/2017  Procedure: CARPAL TUNNEL RELEASE;  Surgeon: Carole Civil, MD;  Location: [facility];  Service: Orthopedics;  Laterality: Right;  CERVICAL SPINE SURGERY  2010  COLONOSCOPY WITH PROPOFOL N/A 11/30/2015  Procedure: COLONOSCOPY WITH PROPOFOL;  Surgeon: Daneil Dolin, MD;  Location[facility];  Service: Endoscopy;  Laterality:  N/A;  KNEE ARTHROSCOPY  [place] surgery - broken nose    POLYPECTOMY  11/30/2015  Procedure: POLYPECTOMY;  Surgeon: Daneil Dolin, MD;  Location: [facility];  Service: Endoscopy;;  polyp at ascending colon, rectal polyp  TRANSURETHRAL INCISION OF PROSTATE  N/A 01/20/2015  Procedure: TRANSURETHRAL INCISION OF THE PROSTATE (TUIP);  Surgeon: Irine Seal, MD;  Location: [facility];  Service: Urology;  Laterality: N/A; HPI: 75 y.o. male presented to [facility] ED with diarrhea, decreased p.o. intake and nausea. Pt admitted with acute encephalopathy and symptomatic hyponatremia. Abdominal distension 8/27. CT abd/pelvis (8/28) (+) SBO and NGT placed. 8/28 pt went into cardiac arrest, concern for possible STEMI. Transferred to [facility] for further treatment. CT head (8/28) revealed no acute intracranial abnormality. Chest xray (8/30) noted "appearance of airspace disease in the right middle lobe. Small left pleural effusion". Hosptial admission complicated by continued impulsivity and agitation with pt pulling out multiple NGT's despite restraints.  ETT: 8/28-8/30. PMHx significant for CKD, BPH, DMII, Hx of CVA, HTN, and GERD. SLE (08/23/2017) revealed cognitive communication deficits post CVA.  Subjective: Pt seen in radiology for instrumental assessment of swallow function and safety. No family present. Assessment / Plan / Recommendation CHL IP CLINICAL IMPRESSIONS 12/17/2020 Clinical Impression Pt presents with ongoing dysphagia with several factors that could be persistently impacting effective swallowing. Pt has a prior ACDF with hardware present from C3-C6. His brother denies any knowledge of baseline impairment, but suspect pt may have had a mild change in swallowing since that surgery that he compensated for and can no longer discuss. He is noted to have decreased epiglottic deflection, with epiglottic movement restricted by posterior pharyngeal wall, keeping vestibule slightly open at the height of  laryngeal elevation. Pt has instances of bolus penetratating the vestibule before hyoid excursion with thin and nectar thick liquids squeezing between the posterior process of the glottis during hyoid excursion. Pts sensation is absent and voice is slightly hoarse. Question the integrity of glottic competence given this finding. Pt was unable to sustain a chin tuck or achieve any other strategies gievn mentation and decreased neck ROM. Recommend pt continue a finely chopped diet given rapid intake of solids with partial mastication and spillage of solids to lower pharynx during ongoing masticaiton. Pt should also continue honey thick liquids. Discussed findings with brother. Recommend a repeat MBS a few weeks after NG tube removal for potential upgrade as presence of NG could be another complicating factor. If there is no improvement in airway protection with liquids after another 14 days approximately, would recommend referral to ENT to examine larynx directly. SLP Visit Diagnosis Dysphagia, oropharyngeal phase (R13.12) Attention and concentration deficit following -- Frontal lobe and executive function deficit following -- Impact on safety and function Moderate aspiration risk   CHL IP TREATMENT RECOMMENDATION 12/17/2020 Treatment Recommendations Therapy as outlined in treatment plan below   Prognosis 12/17/2020 Prognosis for Safe Diet Advancement Good Barriers to Reach Goals -- Barriers/Prognosis Comment -- CHL IP DIET RECOMMENDATION 12/17/2020 SLP Diet Recommendations Dysphagia 2 (Fine chop) solids;Honey thick liquids Liquid Administration via Cup;Straw;Spoon Medication Administration Crushed with puree Compensations Minimize environmental distractions;Slow rate;Small sips/bites Postural Changes Remain semi-upright after after feeds/meals (Comment)   CHL IP OTHER RECOMMENDATIONS 12/17/2020 Recommended Consults -- Oral Care Recommendations Oral care BID Other Recommendations --   CHL IP FOLLOW UP RECOMMENDATIONS  12/17/2020 Follow up Recommendations Skilled Nursing facility   [facility] IP FREQUENCY AND DURATION 12/17/2020 Speech Therapy Frequency (ACUTE ONLY) min 2x/week Treatment Duration 2 weeks      CHL IP ORAL PHASE 12/17/2020 Oral Phase Impaired Oral - Pudding Teaspoon -- Oral - Pudding Cup -- Oral - Honey Teaspoon NT Oral - Honey Cup WFL Oral - Nectar Teaspoon WFL Oral - Nectar Cup WFL Oral - Nectar Straw  WFL Oral - Thin Teaspoon -- Oral - Thin Cup WFL Oral - Thin Straw WFL Oral - Puree WFL Oral - Mech Soft -- Oral - Regular Premature spillage;Decreased bolus cohesion;Delayed oral transit;Holding of bolus Oral - Multi-Consistency -- Oral - Pill -- Oral Phase - Comment --  CHL IP PHARYNGEAL PHASE 12/17/2020 Pharyngeal Phase Impaired Pharyngeal- Pudding Teaspoon -- Pharyngeal -- Pharyngeal- Pudding Cup -- Pharyngeal -- Pharyngeal- Honey Teaspoon NT Pharyngeal -- Pharyngeal- Honey Cup WFL Pharyngeal -- Pharyngeal- Nectar Teaspoon NT Pharyngeal -- Pharyngeal- Nectar Cup Penetration/Aspiration before swallow;Penetration/Aspiration during swallow;Reduced epiglottic inversion;Reduced airway/laryngeal closure;Trace aspiration Pharyngeal Material enters airway, CONTACTS cords and not ejected out;Material enters airway, passes BELOW cords without attempt by patient to eject out (silent aspiration);Material enters airway, passes BELOW cords then ejected out Pharyngeal- Nectar Straw Penetration/Aspiration before swallow;Penetration/Aspiration during swallow;Reduced epiglottic inversion;Reduced airway/laryngeal closure;Trace aspiration Pharyngeal Material enters airway, CONTACTS cords and not ejected out;Material enters airway, passes BELOW cords without attempt by patient to eject out (silent aspiration) Pharyngeal- Thin Teaspoon -- Pharyngeal -- Pharyngeal- Thin Cup Reduced epiglottic inversion;Penetration/Aspiration before swallow;Penetration/Aspiration during swallow;Trace aspiration;Moderate aspiration;Reduced airway/laryngeal closure  Pharyngeal Material enters airway, passes BELOW cords without attempt by patient to eject out (silent aspiration);Material enters airway, CONTACTS cords and then ejected out;Material enters airway, CONTACTS cords and not ejected out Pharyngeal- Thin Straw Reduced epiglottic inversion;Penetration/Aspiration before swallow;Penetration/Aspiration during swallow;Trace aspiration;Moderate aspiration;Reduced airway/laryngeal closure Pharyngeal Material enters airway, passes BELOW cords without attempt by patient to eject out (silent aspiration);Material enters airway, CONTACTS cords and then ejected out;Material enters airway, CONTACTS cords and not ejected out Pharyngeal- Puree WFL Pharyngeal -- Pharyngeal- Mechanical Soft -- Pharyngeal -- Pharyngeal- Regular Pharyngeal residue - valleculae;Pharyngeal residue - pyriform Pharyngeal -- Pharyngeal- Multi-consistency -- Pharyngeal -- Pharyngeal- Pill -- Pharyngeal -- Pharyngeal Comment --  CHL IP CERVICAL ESOPHAGEAL PHASE 12/09/2020 Cervical Esophageal Phase WFL Pudding Teaspoon -- Pudding Cup -- Honey Teaspoon -- Honey Cup -- Nectar Teaspoon -- Nectar Cup -- Nectar Straw -- Thin Teaspoon -- Thin Cup -- Thin Straw -- Puree -- Mechanical Soft -- Regular -- Multi-consistency -- Pill -- Cervical Esophageal Comment -- Lynann Beaver 12/17/2020, 11:52 AM              DG Swallowing Func-Speech Pathology  Result Date: 12/09/2020 Table formatting from the original result was not included. Objective Swallowing Evaluation: Type of Study: MBS-Modified Barium Swallow Study  Patient Details Name: Albert Garrison MRN: [id number] Date of Birth: Mar 25, 1946 Today's Date: 12/09/2020 Time: SLP Start Time (ACUTE ONLY): 1300 -SLP Stop Time (ACUTE ONLY): 1325 SLP Time Calculation (min) (ACUTE ONLY): 25 min Past Medical History: Past Medical History: Diagnosis Date  Arthritis   BPH (benign prostatic hyperplasia)   CKD (chronic kidney disease), stage II   Complicated UTI (urinary tract  infection) 03/2014  Depression   GERD (gastroesophageal reflux disease)   History of pneumonia   History of stroke   HOH (hard of hearing)   Hypertension   Hyponatremia   Lower GI bleed 2017  a. ? due to polyp.  NSVT (nonsustained ventricular tachycardia) (HCC)   Rheumatic fever   Childhood  Sleep apnea   Does not use CPAP  Type 2 diabetes mellitus ([place])  Past Surgical History: Past Surgical History: Procedure Laterality Date  CARPAL TUNNEL RELEASE Left 2010  CARPAL TUNNEL RELEASE Right 07/13/2017  Procedure: CARPAL TUNNEL RELEASE;  Surgeon: Carole Civil, MD;  Location: [facility];  Service: Orthopedics;  Laterality: Right;  CERVICAL SPINE SURGERY  2010  COLONOSCOPY WITH PROPOFOL N/A  11/30/2015  Procedure: COLONOSCOPY WITH PROPOFOL;  Surgeon: Daneil Dolin, MD;  [facility];  Service: Endoscopy;  Laterality: N/A;  KNEE ARTHROSCOPY  [place] surgery - broken nose    POLYPECTOMY  11/30/2015  Procedure: POLYPECTOMY;  Surgeon: Daneil Dolin, MD;  [facility];  Service: Endoscopy;;  polyp at ascending colon, rectal polyp  TRANSURETHRAL INCISION OF PROSTATE N/A 01/20/2015  Procedure: TRANSURETHRAL INCISION OF THE PROSTATE (TUIP);  Surgeon: Irine Seal, MD;  [facility];  Service: Urology;  Laterality: N/A; HPI: 75 y.o. male presented to [facility] ED with diarrhea, decreased p.o. intake and nausea. Pt admitted with acute encephalopathy and symptomatic hyponatremia. Abdominal distension 8/27. CT abd/pelvis (8/28) (+) SBO and NGT placed. 8/28 pt went into cardiac arrest, concern for possible STEMI. Transferred to [facility] for further treatment. CT head (8/28) revealed no acute intracranial abnormality. Chest xray (8/30) noted "appearance of airspace disease in the right middle lobe. Small left pleural effusion". Hosptial admission complicated by continued impulsivity and agitation with pt pulling out multiple NGT's despite restraints.  ETT: 8/28-8/30. PMHx significant for CKD,  BPH, DMII, Hx of CVA, HTN, and GERD. SLE (08/23/2017) revealed cognitive communication deficits post CVA.  Subjective: Pt seen in radiology for instrumental assessment of swallow function and safety. No family present. Assessment / Plan / Recommendation CHL IP CLINICAL IMPRESSIONS 12/09/2020 Clinical Impression Pt presents with a moderate-severe oral and pharyngeal dysphagia, characterized as follows: Orally, pt demonstrates poor bolus formation and control, with premature spillage over the tongue base across consistencies. Pt was noted to piecemeal several boluses, swallowing small amounts of the bolus at one time. Oral residue was also noted, which then spilled posteriorly into the vallecular sinus. Pharyngeal swallow is characterized by delayed swallow reflex, with trigger occurring at the level of the pyriform sinuses on nectar and honey thick liquids (cup sip), and at the vallecular sinus on honey (tsp) and puree. Epiglottic inversion was reduced, in large part by the presence of the Cortrak feeding tube. This resulted in vallecular residue across consistencies. This increases aspiration risk, as residue thins with saliva, or is added to by subsequent boluses and can potentially spill over the vallecular sinus and into the open airway. SILENT ASPIRATION was seen on nectar thick liquids via teaspoon and cup sip. Flash penetration was noted during the swallow of honey thick liquids and puree. Pt was noted to be impulsive with cup sips, and has difficulty following commands due to confusion and hearing loss. Pt is at significantly high risk of aspiration of any consistency given. Puree and honey thick liquids may be presented in small bites/sips with 1:1 close supervision. Meds should be crushed in puree or given via Cortrak.  SLP Visit Diagnosis Dysphagia, oropharyngeal phase (R13.12)     Impact on safety and function Severe aspiration risk   CHL IP TREATMENT RECOMMENDATION 12/09/2020 Treatment Recommendations Therapy  as outlined in treatment plan below   Prognosis 12/09/2020 Prognosis for Safe Diet Advancement Fair Barriers to Reach Goals Cognitive deficits   CHL IP DIET RECOMMENDATION 12/09/2020 SLP Diet Recommendations Dysphagia 1 (Puree) solids;Honey thick liquids Liquid Administration via Cup;No straw;Spoon Medication Administration Crushed with puree Compensations Minimize environmental distractions;Slow rate;Small sips/bites Postural Changes Seated upright at 90 degrees;Remain semi-upright after after feeds/meals (Comment)   CHL IP OTHER RECOMMENDATIONS 12/09/2020   Oral Care Recommendations Oral care QID Other Recommendations Order thickener from pharmacy   CHL IP FOLLOW UP RECOMMENDATIONS 12/09/2020 Follow up Recommendations  TBD   CHL IP FREQUENCY AND DURATION 12/09/2020 Speech Therapy Frequency (ACUTE ONLY) min 2x/week Treatment Duration 2 weeks      CHL IP ORAL PHASE 12/09/2020 Oral Phase Impaired  Oral - Honey Teaspoon Premature spillage;Other (Comment) Oral - Honey Cup Premature spillage;Other (Comment);Piecemeal swallowing Oral - Nectar Teaspoon Other (Comment);Premature spillage Oral - Nectar Cup Premature spillage;Other (Comment);Piecemeal swallowing Oral - Puree Piecemeal swallowing;Premature spillage   CHL IP PHARYNGEAL PHASE 12/09/2020 Pharyngeal Phase Impaired Pharyngeal- Honey Teaspoon Delayed swallow initiation-vallecula;Pharyngeal residue - valleculae;Penetration/Aspiration during swallow Pharyngeal Material enters airway, remains ABOVE vocal cords then ejected out Pharyngeal- Honey Cup Delayed swallow initiation-pyriform sinuses;Pharyngeal residue - valleculae;Penetration/Aspiration during swallow Pharyngeal Material enters airway, remains ABOVE vocal cords then ejected out Pharyngeal- Nectar Teaspoon Delayed swallow initiation-pyriform sinuses;Pharyngeal residue - valleculae;Penetration/Aspiration during swallow Pharyngeal Material enters airway, passes BELOW cords without attempt by patient to eject out (silent  aspiration) Pharyngeal- Nectar Cup Delayed swallow initiation-pyriform sinuses;Pharyngeal residue - valleculae;Penetration/Aspiration during swallow Pharyngeal Material enters airway, passes BELOW cords without attempt by patient to eject out (silent aspiration) Pharyngeal- Puree Delayed swallow initiation-vallecula;Pharyngeal residue - valleculae;Penetration/Aspiration during swallow Pharyngeal Material enters airway, remains ABOVE vocal cords and not ejected out  CHL IP CERVICAL ESOPHAGEAL PHASE 12/09/2020 Cervical Esophageal Phase [name], [facility], [place] Speech Language Pathologist Office: [phone] Shonna Chock 12/09/2020, 2:00 PM               Orson Eva, DO  [facility]  If 7PM-7AM, please contact night-coverage [email or web address] Password [email or web address] 01/06/2021, 5:58 PM   LOS: 12 days

## 2021-01-07 ENCOUNTER — Inpatient Hospital Stay (HOSPITAL_COMMUNITY): Payer: Medicare Other

## 2021-01-07 DIAGNOSIS — N401 Enlarged prostate with lower urinary tract symptoms: Secondary | ICD-10-CM | POA: Diagnosis not present

## 2021-01-07 DIAGNOSIS — R338 Other retention of urine: Secondary | ICD-10-CM | POA: Diagnosis not present

## 2021-01-07 DIAGNOSIS — N390 Urinary tract infection, site not specified: Secondary | ICD-10-CM | POA: Diagnosis not present

## 2021-01-07 DIAGNOSIS — E119 Type 2 diabetes mellitus without complications: Secondary | ICD-10-CM | POA: Diagnosis not present

## 2021-01-07 LAB — CBC
HCT: 35.6 % — ABNORMAL LOW (ref 39.0–52.0)
Hemoglobin: 11.6 g/dL — ABNORMAL LOW (ref 13.0–17.0)
MCH: 30.2 pg (ref 26.0–34.0)
MCHC: 32.6 g/dL (ref 30.0–36.0)
MCV: 92.7 fL (ref 80.0–100.0)
Platelets: 336 10*3/uL (ref 150–400)
RBC: 3.84 MIL/uL — ABNORMAL LOW (ref 4.22–5.81)
RDW: 14.5 % (ref 11.5–15.5)
WBC: 6.9 10*3/uL (ref 4.0–10.5)
nRBC: 0 % (ref 0.0–0.2)

## 2021-01-07 LAB — RESP PANEL BY RT-PCR (FLU A&B, COVID) ARPGX2
Influenza A by PCR: NEGATIVE
Influenza B by PCR: NEGATIVE
SARS Coronavirus 2 by RT PCR: NEGATIVE

## 2021-01-07 LAB — GLUCOSE, CAPILLARY
Glucose-Capillary: 157 mg/dL — ABNORMAL HIGH (ref 70–99)
Glucose-Capillary: 178 mg/dL — ABNORMAL HIGH (ref 70–99)
Glucose-Capillary: 140 mg/dL — ABNORMAL HIGH (ref 70–99)
Glucose-Capillary: 237 mg/dL — ABNORMAL HIGH (ref 70–99)

## 2021-01-07 LAB — BASIC METABOLIC PANEL
Anion gap: 6 (ref 5–15)
BUN: 11 mg/dL (ref 8–23)
CO2: 27 mmol/L (ref 22–32)
Calcium: 8.6 mg/dL — ABNORMAL LOW (ref 8.9–10.3)
Chloride: 101 mmol/L (ref 98–111)
Creatinine, Ser: 1.1 mg/dL (ref 0.61–1.24)
GFR, Estimated: >60 mL/min (ref >60)
Glucose, Bld: 155 mg/dL — ABNORMAL HIGH (ref 70–99)
Potassium: 3.9 mmol/L (ref 3.5–5.1)
Sodium: 134 mmol/L — ABNORMAL LOW (ref 135–145)

## 2021-01-07 LAB — MAGNESIUM: Magnesium: 1.9 mg/dL (ref 1.7–2.4)

## 2021-01-07 LAB — PHOSPHORUS: Phosphorus: 2.8 mg/dL (ref 2.5–4.6)

## 2021-01-07 NOTE — TOC Progression Note (Signed)
Transition of Care [facility]) - Progression Note    Patient Details  Name: AUL MANGIERI MRN: [id number] Date of Birth: [birth year]  Transition of Care [facility] CM/SW Contact  Ihor Gully, LCSW Phone Number: 01/07/2021, 1:28 PM  Clinical Narrative:    [facility] can accept patient, however; they need a TB skin test administered and read or a chest x-ray that states it is being performed to identify TB and read saying no TB found.  Facility needs his brother to come in an sign his paperwork.  Facility has left him a message and TOC left him a message today regarding the issue. If patient has had both vaccines and a booster, he can admit with a rapid test. If he is unvaccinated, he must admit with a PCR covid test.  Colletta Maryland with [facility] will email brother and contact patient's aunt in an effort to reach brother to sign paperwork.     Barriers to Discharge: Unsafe home situation  Expected Discharge Plan and Services   In-house Referral: Clinical Social Work       Expected Discharge Date: 12/27/20                                     Social Determinants of Health (SDOH) Interventions    Readmission Risk Interventions Readmission Risk Prevention Plan 01/01/2021  Transportation Screening Complete  PCP or Specialist Appt within 3-5 Days Complete  HRI or [place] Complete  Social Work Consult for [place] Planning/Counseling Complete  Palliative Care Screening Not Applicable  Medication Review [employer]) Complete  Some recent data might be hidden

## 2021-01-07 NOTE — Progress Notes (Signed)
Physical Therapy Treatment Patient Details Name: Albert Garrison MRN: [id number] DOB: 14-Jul-1945 Today's Date: 01/07/2021   History of Present Illness Albert Garrison is a 75 y.o. male with a history of type 2 diabetes, stage III chronic kidney disease, BPH, history of stroke, GERD, history of Cardiac Arrest.  The patient was recently admitted on 11/27/2020 and was discharged approximately 3 days ago from the hospital.  During that hospitalization, the patient went into cardiac arrest and had acute hypoxemic respiratory failure, he was intubated and ROSC was obtained after about 1 minute.  The patient presented this time with acute metabolic encephalopathy suspected to be related to UTI as well as some noted hematuria on presentation.  His urine cultures have demonstrated no growth, however he has had group A strep bacteremia noted and case was discussed with ID with recommendations to remain on oral cefdinir for 8 more days to complete 10-day course of treatment.  He has had improvement in leukocytosis as well as his mentation and has remained afebrile during the course of the    PT Comments    Pt sitting up in recliner.  Agreeable for participation with therapy this session.  No assist to stand, however required assist due to general confusion and handling of catheter.  Pt ambulated 200 feet with RW and no LOB or fatigue noted.  Pt returned to bed per request.    Recommendations for follow up therapy are one component of a multi-disciplinary discharge planning process, led by the attending physician.  Recommendations may be updated based on patient status, additional functional criteria and insurance authorization.  Follow Up Recommendations  Supervision for mobility/OOB;Supervision - Intermittent     Equipment Recommendations       Recommendations for Other Services       Precautions / Restrictions Precautions Precautions: None     Mobility  Bed Mobility Overal bed mobility: Modified  Independent Bed Mobility: Supine to Sit       Sit to supine: Supervision        Transfers Overall transfer level: Modified independent Equipment used: Rolling walker (2 wheeled) Transfers: Sit to/from Stand Sit to Stand: Min guard Stand pivot transfers: Min guard       General transfer comment: cueing for hand placement  Ambulation/Gait Ambulation/Gait assistance: Modified independent (Device/Increase time) Gait Distance (Feet): 200 Feet Assistive device: Rolling walker (2 wheeled) Gait Pattern/deviations: Decreased step length - left;Decreased stride length Gait velocity: Decreased   General Gait Details: RW for safety, slow labored movements, no LOB, cueing to improve stride length   Stairs             Wheelchair Mobility    Modified Rankin (Stroke Patients Only)       Balance                                            Cognition Arousal/Alertness: Awake/alert Behavior During Therapy: WFL for tasks assessed/performed;Impulsive Overall Cognitive Status: No family/caregiver present to determine baseline cognitive functioning Area of Impairment: Awareness;Memory;Problem solving                   Current Attention Level: Sustained;Focused Memory: Decreased short-term memory Following Commands: Follows multi-step commands with increased time   Awareness: Intellectual Problem Solving: Requires verbal cues General Comments: Pt able to recall name and that he is in hosptial, unable to answer DOB or situation  Exercises      General Comments        Pertinent Vitals/Pain      Home Living                      Prior Function            PT Goals (current goals can now be found in the care plan section)      Frequency    Min 3X/week      PT Plan Current plan remains appropriate    Co-evaluation              AM-PAC PT "6 Clicks" Mobility   Outcome Measure  Help needed turning from your  back to your side while in a flat bed without using bedrails?: None Help needed moving from lying on your back to sitting on the side of a flat bed without using bedrails?: None Help needed moving to and from a bed to a chair (including a wheelchair)?: None Help needed standing up from a chair using your arms (e.g., wheelchair or bedside chair)?: None Help needed to walk in hospital room?: A Little Help needed climbing 3-5 steps with a railing? : A Lot 6 Click Score: 21    End of Session Equipment Utilized During Treatment: Gait belt Activity Tolerance: Patient tolerated treatment well Patient left: with bed alarm set;with call bell/phone within reach;in bed Nurse Communication: Mobility status PT Visit Diagnosis: Unsteadiness on feet (R26.81);Muscle weakness (generalized) (M62.81)     Time: 2620-3559 PT Time Calculation (min) (ACUTE ONLY): 17 min  Charges:  $Gait Training: 8-22 mins                     Teena Irani, PTA/CLT, WTA [phone]    Mare Ferrari, Stephanine Reas B 01/07/2021, 4:49 PM

## 2021-01-07 NOTE — Progress Notes (Signed)
PROGRESS NOTE  Albert SCHAKE AYO:[id number] DOB: 1946-02-18 DOA: 12/25/2020 PCP: Caprice Renshaw, MD  Brief History:  Albert Garrison is a 75 y.o. male with a history of type 2 diabetes, BPH, history of stroke, GERD, history of Cardiac Arrest.  The patient was recently admitted on 11/27/2020 and was discharged approximately 3 days prior to presentation from the hospital.  During that hospitalization, the patient went into cardiac arrest and had acute hypoxemic respiratory failure, he was intubated and ROSC was obtained after about 1 minute.  The patient now presents with hematuria.  He has been admitted with some acute metabolic encephalopathy in the setting of catheter associated UTI.  He continues to have some confusion, but he does not qualify for SNF and therefore is unsafe for discharge to home.  APS evaluation pending as well as guardianship.  He was treated for group A strep bacteremia and completed a 10-day course of Rocephin.  No noted agitation for the past 48 hours, labs and vital signs have been stable.  CSW is assisting with DC planning.  Plan for discharge to memory care assisted living facility.   01/05/2021: He was seen and examined this morning.  He was alert and pleasantly confused.  He had no new complaints.   01/07/21:  remains pleasantly confused.  No complaints.  Remains medically stable. Doing better with PT.   Assessment/Plan: Acute metabolic encephalopathy, persistent Plan to discharge to memory care assisted living facility. He completed 10-day course of IV antibiotics Rocephin for Group A strep bacteremia and UTI on 01/03/21. -now remains pleasantly confused--suspect he is near baseline   BPH with chronic urinary retention, Foley catheter in place. Tried voiding trial 9/27, but he continued to have issues with urine retention -Foley catheter replaced on 9/27 and he will need outpatient urology follow-up Continue Flomax Continue to monitor urine output.   Mild  euvolemic hyponatremia Serum sodium 134 Monitor for now Encourage oral intake   Treated Group A strep bacteremia Completed Rocephin on 01/03/2021. -No need to evaluate further for endocarditis -Discussed case with Dr. Gale Journey with ID -Repeat blood cultures ordered on 9/29 with no growth final.   Type 2 diabetes with hyperglycemia Last hemoglobin A1c 8.6 on 11/27/20 -DYS 2 diet Continue insulin sliding scale.   History of stroke -Continue aspirin and Lipitor -DYS 2 diet   History of NSTEMI -Continue aspirin, Lipitor, beta-blocker             Status is: Inpatient   Remains inpatient appropriate because:Inpatient level of care appropriate due to severity of illness   Dispo:             Patient From: Home             Planned Disposition: ALF             Medically stable for discharge: No                           Family Communication: no  Family at bedside   Consultants:  none   Code Status:  FULL   DVT Prophylaxis: [place] Lovenox     Procedures: As Listed in Progress Note Above   Antibiotics: None        Subjective: Patient denies fevers, chills, headache, chest pain, dyspnea, nausea, vomiting, diarrhea, abdominal pain,   Objective: Vitals:   01/06/21 1458 01/06/21 2059 01/07/21 0552 01/07/21 1356  BP:  105/64 120/63 128/63 106/65  Pulse: (!) 58 62 (!) 52 (!) 58  Resp: 20 18 15 19   Temp: 97.7 F (36.5 C) 98.2 F (36.8 C) 97.8 F (36.6 C) 97.8 F (36.6 C)  TempSrc: Oral Oral Oral Oral  SpO2: 98% 98% 97% 97%  Weight:      Height:        Intake/Output Summary (Last 24 hours) at 01/07/2021 1815 Last data filed at 01/07/2021 0556 Gross per 24 hour  Intake --  Output 2100 ml  Net -2100 ml   Weight change:  Exam:  General:  Pt is alert, follows commands appropriately, not in acute distress HEENT: No icterus, No thrush, No neck mass, [place]/AT Cardiovascular: RRR, S1/S2, no rubs, no gallops Respiratory: CTA bilaterally, no wheezing, no crackles, no  rhonchi Abdomen: Soft/+BS, non tender, non distended, no guarding Extremities: No edema, No lymphangitis, No petechiae, No rashes, no synovitis   Data Reviewed: I have personally reviewed following labs and imaging studies Basic Metabolic Panel: Recent Labs  Lab 01/01/21 0543 01/02/21 0527 01/03/21 0403 01/04/21 0525 01/07/21 0531  NA 135 134* 134* 134* 134*  K 3.7 3.7 3.7 3.9 3.9  CL 102 102 102 101 101  CO2 23 25 25 25 27   GLUCOSE 209* 205* 178* 179* 155*  BUN 11 9 9 12 11   CREATININE 1.09 1.07 1.09 1.23 1.10  CALCIUM 8.8* 8.4* 8.6* 8.8* 8.6*  MG 2.0 2.0 2.0 1.9 1.9  PHOS 3.3 3.0 3.0 2.8 2.8   Liver Function Tests: Recent Labs  Lab 01/01/21 0543 01/02/21 0527 01/03/21 0403 01/04/21 0525  AST 19 17 18 18   ALT 27 24 23 20   ALKPHOS 83 82 82 83  BILITOT 0.6 0.9 0.7 0.9  PROT 6.9 6.9 6.8 7.0  ALBUMIN 3.1* 3.1* 3.1* 3.2*   No results for input(s): LIPASE, AMYLASE in the last 168 hours. No results for input(s): AMMONIA in the last 168 hours. Coagulation Profile: No results for input(s): INR, PROTIME in the last 168 hours. CBC: Recent Labs  Lab 01/01/21 0543 01/02/21 0527 01/03/21 0403 01/04/21 0525 01/07/21 0531  WBC 8.6 8.0 8.0 9.0 6.9  NEUTROABS 5.4 4.4 4.4 4.7  --   HGB 12.1* 11.3* 11.9* 11.9* 11.6*  HCT 36.8* 34.8* 37.3* 37.1* 35.6*  MCV 89.8 91.6 91.4 92.3 92.7  PLT 297 301 312 365 336   Cardiac Enzymes: No results for input(s): CKTOTAL, CKMB, CKMBINDEX, TROPONINI in the last 168 hours. BNP: Invalid input(s): POCBNP CBG: Recent Labs  Lab 01/06/21 1656 01/06/21 2215 01/07/21 0718 01/07/21 1128 01/07/21 1557  GLUCAP 100* 140* 157* 178* 140*   HbA1C: No results for input(s): HGBA1C in the last 72 hours. Urine analysis:    Component Value Date/Time   COLORURINE BROWN (A) 12/25/2020 1238   APPEARANCEUR HAZY (A) 12/25/2020 1238   LABSPEC 1.015 12/25/2020 1238   PHURINE 5.0 12/25/2020 1238   GLUCOSEU >=500 (A) 12/25/2020 1238   HGBUR LARGE  (A) 12/25/2020 1238   BILIRUBINUR NEGATIVE 12/25/2020 1238   KETONESUR NEGATIVE 12/25/2020 1238   PROTEINUR 100 (A) 12/25/2020 1238   UROBILINOGEN 0.2 03/24/2014 1320   NITRITE NEGATIVE 12/25/2020 1238   LEUKOCYTESUR MODERATE (A) 12/25/2020 1238   Sepsis Labs: @LABRCNTIP (procalcitonin:4,lacticidven:4) ) Recent Results (from the past 240 hour(s))  Culture, blood (Routine X 2) w Reflex to ID Panel     Status: None   Collection Time: 12/31/20  5:19 PM   Specimen: BLOOD RIGHT HAND  Result Value Ref Range Status  Specimen Description BLOOD RIGHT HAND  Final   Special Requests   Final    Blood Culture results may not be optimal due to an excessive volume of blood received in culture bottles BOTTLES DRAWN AEROBIC AND ANAEROBIC   Culture   Final    NO GROWTH 5 DAYS Performed at [facility], [address], [place], [place] [address]    Report Status 01/05/2021 FINAL  Final  Culture, blood (Routine X 2) w Reflex to ID Panel     Status: None   Collection Time: 12/31/20  5:19 PM   Specimen: BLOOD LEFT ARM  Result Value Ref Range Status   Specimen Description BLOOD LEFT ARM  Final   Special Requests   Final    Blood Culture results may not be optimal due to an excessive volume of blood received in culture bottles BOTTLES DRAWN AEROBIC AND ANAEROBIC   Culture   Final    NO GROWTH 5 DAYS Performed at [facility], [address] [place],  [address]    Report Status 01/05/2021 FINAL  Final  Resp Panel by RT-PCR (Flu A&B, Covid) Nasopharyngeal Swab     Status: None   Collection Time: 01/07/21  1:44 PM   Specimen: Nasopharyngeal Swab; Nasopharyngeal(NP) swabs in vial transport medium  Result Value Ref Range Status   SARS Coronavirus 2 by RT PCR NEGATIVE NEGATIVE Final    Comment: (NOTE) SARS-CoV-2 target nucleic acids are NOT DETECTED.  The SARS-CoV-2 RNA is generally detectable in upper respiratory specimens during the acute phase of infection. The lowest concentration of  SARS-CoV-2 viral copies this assay can detect is 138 copies/mL. A negative result does not preclude SARS-Cov-2 infection and should not be used as the sole basis for treatment or other patient management decisions. A negative result may occur with  improper specimen collection/handling, submission of specimen other than nasopharyngeal swab, presence of viral mutation(s) within the areas targeted by this assay, and inadequate number of viral copies(<138 copies/mL). A negative result must be combined with clinical observations, patient history, and epidemiological information. The expected result is Negative.  Fact Sheet for Patients:  EntrepreneurPulse.com.au  Fact Sheet for Healthcare Providers:  IncredibleEmployment.be  This test is no t yet approved or cleared by the [place] FDA and  has been authorized for detection and/or diagnosis of SARS-CoV-2 by FDA under an Emergency Use Authorization (EUA). This EUA will remain  in effect (meaning this test can be used) for the duration of the COVID-19 declaration under Section 564(b)(1) of the Act, 21 U.S.C.section 360bbb-3(b)(1), unless the authorization is terminated  or revoked sooner.       Influenza A by PCR NEGATIVE NEGATIVE Final   Influenza B by PCR NEGATIVE NEGATIVE Final    Comment: (NOTE) The Xpert Xpress SARS-CoV-2/FLU/RSV plus assay is intended as an aid in the diagnosis of influenza from Nasopharyngeal swab specimens and should not be used as a sole basis for treatment. Nasal washings and aspirates are unacceptable for Xpert Xpress SARS-CoV-2/FLU/RSV testing.  Fact Sheet for Patients: EntrepreneurPulse.com.au  Fact Sheet for Healthcare Providers: IncredibleEmployment.be  This test is not yet approved or cleared by the [place] FDA and has been authorized for detection and/or diagnosis of SARS-CoV-2 by FDA under an Emergency Use  Authorization (EUA). This EUA will remain in effect (meaning this test can be used) for the duration of the COVID-19 declaration under Section 564(b)(1) of the Act, 21 U.S.C. section 360bbb-3(b)(1), unless the authorization is terminated or revoked.  Performed at  [address] [place], [place] [address]      Scheduled Meds:  amLODipine  10 mg Oral Daily   aspirin  81 mg Oral Daily   atorvastatin  40 mg Oral q1800   Chlorhexidine Gluconate Cloth  6 each Topical Daily   enoxaparin (LOVENOX) injection  40 mg Subcutaneous Q24H   haloperidol  0.5 mg Oral BID   insulin aspart  0-15 Units Subcutaneous TID WC   metoprolol tartrate  25 mg Oral BID   tamsulosin  0.4 mg Oral QPC supper   Continuous Infusions:  Procedures/Studies: DG CHEST PORT 1 VIEW  Result Date: 01/07/2021 CLINICAL DATA:  TB screening. EXAM: PORTABLE CHEST 1 VIEW COMPARISON:  Chest x-ray 12/25/2020. FINDINGS: The heart size and mediastinal contours are within normal limits. Both lungs are clear. Cervical spinal fusion plate is present. The visualized skeletal structures are otherwise unremarkable. IMPRESSION: No active disease. Electronically Signed   By: Ronney Asters M.D.   On: 01/07/2021 15:46   DG Chest Port 1 View  Result Date: 12/25/2020 CLINICAL DATA:  Hematuria and fever. EXAM: PORTABLE CHEST 1 VIEW COMPARISON:  December 01, 2020 FINDINGS: Mild atelectasis and/or infiltrate is seen within the bilateral lung bases. There is no evidence of a pleural effusion or pneumothorax. The heart size and mediastinal contours are within normal limits. Radiopaque fusion plates and screws are seen overlying the cervical spine. IMPRESSION: Mild bibasilar atelectasis and/or infiltrate. Electronically Signed   By: Virgina Norfolk M.D.   On: 12/25/2020 16:56   DG Swallowing Func-Speech Pathology  Result Date: 12/17/2020 Table formatting from the original result was not included. Objective Swallowing Evaluation: Type of  Study: MBS-Modified Barium Swallow Study  Patient Details Name: TARREN VELARDI MRN: [id number] Date of Birth: [birth year] Today's Date: 12/17/2020 Time: SLP Start Time (ACUTE ONLY): 4270 -SLP Stop Time (ACUTE ONLY): 6237 SLP Time Calculation (min) (ACUTE ONLY): 15 min Past Medical History: Past Medical History: Diagnosis Date  Arthritis   BPH (benign prostatic hyperplasia)   CKD (chronic kidney disease), stage II   Complicated UTI (urinary tract infection) 03/2014  Depression   GERD (gastroesophageal reflux disease)   History of pneumonia   History of stroke   HOH (hard of hearing)   Hypertension   Hyponatremia   Lower GI bleed 2017  a. ? due to polyp.  NSVT (nonsustained ventricular tachycardia) (HCC)   Rheumatic fever   Childhood  Sleep apnea   Does not use CPAP  Type 2 diabetes mellitus ([place])  Past Surgical History: Past Surgical History: Procedure Laterality Date  CARPAL TUNNEL RELEASE Left 2010  CARPAL TUNNEL RELEASE Right 07/13/2017  Procedure: CARPAL TUNNEL RELEASE;  Surgeon: Carole Civil, MD;  Location: [facility];  Service: Orthopedics;  Laterality: Right;  CERVICAL SPINE SURGERY  2010  COLONOSCOPY WITH PROPOFOL N/A 11/30/2015  Procedure: COLONOSCOPY WITH PROPOFOL;  Surgeon: Daneil Dolin, MD;  Location[facility];  Service: Endoscopy;  Laterality: N/A;  KNEE ARTHROSCOPY  [place] surgery - broken nose    POLYPECTOMY  11/30/2015  Procedure: POLYPECTOMY;  Surgeon: Daneil Dolin, MD;  Location: [facility];  Service: Endoscopy;;  polyp at ascending colon, rectal polyp  TRANSURETHRAL INCISION OF PROSTATE N/A 01/20/2015  Procedure: TRANSURETHRAL INCISION OF THE PROSTATE (TUIP);  Surgeon: Irine Seal, MD;  Location: [facility];  Service: Urology;  Laterality: N/A; HPI: 75 y.o. male presented to [facility] ED with diarrhea, decreased p.o. intake and nausea. Pt admitted  with acute encephalopathy and symptomatic hyponatremia. Abdominal distension 8/27. CT abd/pelvis (8/28) (+) SBO and NGT  placed. 8/28 pt went into cardiac arrest, concern for possible STEMI. Transferred to [facility] for further treatment. CT head (8/28) revealed no acute intracranial abnormality. Chest xray (8/30) noted "appearance of airspace disease in the right middle lobe. Small left pleural effusion". Hosptial admission complicated by continued impulsivity and agitation with pt pulling out multiple NGT's despite restraints.  ETT: 8/28-8/30. PMHx significant for CKD, BPH, DMII, Hx of CVA, HTN, and GERD. SLE (08/23/2017) revealed cognitive communication deficits post CVA.  Subjective: Pt seen in radiology for instrumental assessment of swallow function and safety. No family present. Assessment / Plan / Recommendation CHL IP CLINICAL IMPRESSIONS 12/17/2020 Clinical Impression Pt presents with ongoing dysphagia with several factors that could be persistently impacting effective swallowing. Pt has a prior ACDF with hardware present from C3-C6. His brother denies any knowledge of baseline impairment, but suspect pt may have had a mild change in swallowing since that surgery that he compensated for and can no longer discuss. He is noted to have decreased epiglottic deflection, with epiglottic movement restricted by posterior pharyngeal wall, keeping vestibule slightly open at the height of laryngeal elevation. Pt has instances of bolus penetratating the vestibule before hyoid excursion with thin and nectar thick liquids squeezing between the posterior process of the glottis during hyoid excursion. Pts sensation is absent and voice is slightly hoarse. Question the integrity of glottic competence given this finding. Pt was unable to sustain a chin tuck or achieve any other strategies gievn mentation and decreased neck ROM. Recommend pt continue a finely chopped diet given rapid intake of solids with partial mastication and spillage of solids to lower pharynx during ongoing masticaiton. Pt should also continue honey thick liquids. Discussed  findings with brother. Recommend a repeat MBS a few weeks after NG tube removal for potential upgrade as presence of NG could be another complicating factor. If there is no improvement in airway protection with liquids after another 14 days approximately, would recommend referral to ENT to examine larynx directly. SLP Visit Diagnosis Dysphagia, oropharyngeal phase (R13.12) Attention and concentration deficit following -- Frontal lobe and executive function deficit following -- Impact on safety and function Moderate aspiration risk   CHL IP TREATMENT RECOMMENDATION 12/17/2020 Treatment Recommendations Therapy as outlined in treatment plan below   Prognosis 12/17/2020 Prognosis for Safe Diet Advancement Good Barriers to Reach Goals -- Barriers/Prognosis Comment -- CHL IP DIET RECOMMENDATION 12/17/2020 SLP Diet Recommendations Dysphagia 2 (Fine chop) solids;Honey thick liquids Liquid Administration via Cup;Straw;Spoon Medication Administration Crushed with puree Compensations Minimize environmental distractions;Slow rate;Small sips/bites Postural Changes Remain semi-upright after after feeds/meals (Comment)   CHL IP OTHER RECOMMENDATIONS 12/17/2020 Recommended Consults -- Oral Care Recommendations Oral care BID Other Recommendations --   CHL IP FOLLOW UP RECOMMENDATIONS 12/17/2020 Follow up Recommendations Skilled Nursing facility   [facility] IP FREQUENCY AND DURATION 12/17/2020 Speech Therapy Frequency (ACUTE ONLY) min 2x/week Treatment Duration 2 weeks      CHL IP ORAL PHASE 12/17/2020 Oral Phase Impaired Oral - Pudding Teaspoon -- Oral - Pudding Cup -- Oral - Honey Teaspoon NT Oral - Honey Cup WFL Oral - Nectar Teaspoon WFL Oral - Nectar Cup WFL Oral - Nectar Straw WFL Oral - Thin Teaspoon -- Oral - Thin Cup WFL Oral - Thin Straw WFL Oral - Puree WFL Oral - Mech Soft -- Oral - Regular Premature spillage;Decreased bolus cohesion;Delayed oral transit;Holding of bolus Oral - Multi-Consistency -- Oral -  Pill -- Oral Phase - Comment  --  CHL IP PHARYNGEAL PHASE 12/17/2020 Pharyngeal Phase Impaired Pharyngeal- Pudding Teaspoon -- Pharyngeal -- Pharyngeal- Pudding Cup -- Pharyngeal -- Pharyngeal- Honey Teaspoon NT Pharyngeal -- Pharyngeal- Honey Cup WFL Pharyngeal -- Pharyngeal- Nectar Teaspoon NT Pharyngeal -- Pharyngeal- Nectar Cup Penetration/Aspiration before swallow;Penetration/Aspiration during swallow;Reduced epiglottic inversion;Reduced airway/laryngeal closure;Trace aspiration Pharyngeal Material enters airway, CONTACTS cords and not ejected out;Material enters airway, passes BELOW cords without attempt by patient to eject out (silent aspiration);Material enters airway, passes BELOW cords then ejected out Pharyngeal- Nectar Straw Penetration/Aspiration before swallow;Penetration/Aspiration during swallow;Reduced epiglottic inversion;Reduced airway/laryngeal closure;Trace aspiration Pharyngeal Material enters airway, CONTACTS cords and not ejected out;Material enters airway, passes BELOW cords without attempt by patient to eject out (silent aspiration) Pharyngeal- Thin Teaspoon -- Pharyngeal -- Pharyngeal- Thin Cup Reduced epiglottic inversion;Penetration/Aspiration before swallow;Penetration/Aspiration during swallow;Trace aspiration;Moderate aspiration;Reduced airway/laryngeal closure Pharyngeal Material enters airway, passes BELOW cords without attempt by patient to eject out (silent aspiration);Material enters airway, CONTACTS cords and then ejected out;Material enters airway, CONTACTS cords and not ejected out Pharyngeal- Thin Straw Reduced epiglottic inversion;Penetration/Aspiration before swallow;Penetration/Aspiration during swallow;Trace aspiration;Moderate aspiration;Reduced airway/laryngeal closure Pharyngeal Material enters airway, passes BELOW cords without attempt by patient to eject out (silent aspiration);Material enters airway, CONTACTS cords and then ejected out;Material enters airway, CONTACTS cords and not ejected out  Pharyngeal- Puree WFL Pharyngeal -- Pharyngeal- Mechanical Soft -- Pharyngeal -- Pharyngeal- Regular Pharyngeal residue - valleculae;Pharyngeal residue - pyriform Pharyngeal -- Pharyngeal- Multi-consistency -- Pharyngeal -- Pharyngeal- Pill -- Pharyngeal -- Pharyngeal Comment --  CHL IP CERVICAL ESOPHAGEAL PHASE 12/09/2020 Cervical Esophageal Phase WFL Pudding Teaspoon -- Pudding Cup -- Honey Teaspoon -- Honey Cup -- Nectar Teaspoon -- Nectar Cup -- Nectar Straw -- Thin Teaspoon -- Thin Cup -- Thin Straw -- Puree -- Mechanical Soft -- Regular -- Multi-consistency -- Pill -- Cervical Esophageal Comment -- Lynann Beaver 12/17/2020, 11:52 AM              DG Swallowing Func-Speech Pathology  Result Date: 12/09/2020 Table formatting from the original result was not included. Objective Swallowing Evaluation: Type of Study: MBS-Modified Barium Swallow Study  Patient Details Name: Albert Garrison MRN: [id number] Date of Birth: Aug 16, 1945 Today's Date: 12/09/2020 Time: SLP Start Time (ACUTE ONLY): 1300 -SLP Stop Time (ACUTE ONLY): 1325 SLP Time Calculation (min) (ACUTE ONLY): 25 min Past Medical History: Past Medical History: Diagnosis Date  Arthritis   BPH (benign prostatic hyperplasia)   CKD (chronic kidney disease), stage II   Complicated UTI (urinary tract infection) 03/2014  Depression   GERD (gastroesophageal reflux disease)   History of pneumonia   History of stroke   HOH (hard of hearing)   Hypertension   Hyponatremia   Lower GI bleed 2017  a. ? due to polyp.  NSVT (nonsustained ventricular tachycardia) (HCC)   Rheumatic fever   Childhood  Sleep apnea   Does not use CPAP  Type 2 diabetes mellitus ([place])  Past Surgical History: Past Surgical History: Procedure Laterality Date  CARPAL TUNNEL RELEASE Left 2010  CARPAL TUNNEL RELEASE Right 07/13/2017  Procedure: CARPAL TUNNEL RELEASE;  Surgeon: Carole Civil, MD;  Location: [facility];  Service: Orthopedics;  Laterality: Right;  CERVICAL SPINE SURGERY  2010   COLONOSCOPY WITH PROPOFOL N/A 11/30/2015  Procedure: COLONOSCOPY WITH PROPOFOL;  Surgeon: Daneil Dolin, MD;  [facility];  Service: Endoscopy;  Laterality: N/A;  KNEE ARTHROSCOPY  [place]  Nose surgery - broken nose  POLYPECTOMY  11/30/2015  Procedure: POLYPECTOMY;  Surgeon: Daneil Dolin, MD;  [facility];  Service: Endoscopy;;  polyp at ascending colon, rectal polyp  TRANSURETHRAL INCISION OF PROSTATE N/A 01/20/2015  Procedure: TRANSURETHRAL INCISION OF THE PROSTATE (TUIP);  Surgeon: Irine Seal, MD;  [facility];  Service: Urology;  Laterality: N/A; HPI: 75 y.o. male presented to [facility] ED with diarrhea, decreased p.o. intake and nausea. Pt admitted with acute encephalopathy and symptomatic hyponatremia. Abdominal distension 8/27. CT abd/pelvis (8/28) (+) SBO and NGT placed. 8/28 pt went into cardiac arrest, concern for possible STEMI. Transferred to [facility] for [facility] treatment. CT head (8/28) revealed no acute intracranial abnormality. Chest xray (8/30) noted "appearance of airspace disease in the right middle lobe. Small left pleural effusion". Hosptial admission complicated by continued impulsivity and agitation with pt pulling out multiple NGT's despite restraints.  ETT: 8/28-8/30. PMHx significant for CKD, BPH, DMII, Hx of CVA, HTN, and GERD. SLE (08/23/2017) revealed cognitive communication deficits post CVA.  Subjective: Pt seen in radiology for instrumental assessment of swallow function and safety. No family present. Assessment / Plan / Recommendation CHL IP CLINICAL IMPRESSIONS 12/09/2020 Clinical Impression Pt presents with a moderate-severe oral and pharyngeal dysphagia, characterized as follows: Orally, pt demonstrates poor bolus formation and control, with premature spillage over the tongue base across consistencies. Pt was noted to piecemeal several boluses, swallowing small amounts of the bolus at one time. Oral residue was also noted, which then  spilled posteriorly into the vallecular sinus. Pharyngeal swallow is characterized by delayed swallow reflex, with trigger occurring at the level of the pyriform sinuses on nectar and honey thick liquids (cup sip), and at the vallecular sinus on honey (tsp) and puree. Epiglottic inversion was reduced, in large part by the presence of the Cortrak feeding tube. This resulted in vallecular residue across consistencies. This increases aspiration risk, as residue thins with saliva, or is added to by subsequent boluses and can potentially spill over the vallecular sinus and into the open airway. SILENT ASPIRATION was seen on nectar thick liquids via teaspoon and cup sip. Flash penetration was noted during the swallow of honey thick liquids and puree. Pt was noted to be impulsive with cup sips, and has difficulty following commands due to confusion and hearing loss. Pt is at significantly high risk of aspiration of any consistency given. Puree and honey thick liquids may be presented in small bites/sips with 1:1 close supervision. Meds should be crushed in puree or given via Cortrak.  SLP Visit Diagnosis Dysphagia, oropharyngeal phase (R13.12)     Impact on safety and function Severe aspiration risk   CHL IP TREATMENT RECOMMENDATION 12/09/2020 Treatment Recommendations Therapy as outlined in treatment plan below   Prognosis 12/09/2020 Prognosis for Safe Diet Advancement Fair Barriers to Reach Goals Cognitive deficits   CHL IP DIET RECOMMENDATION 12/09/2020 SLP Diet Recommendations Dysphagia 1 (Puree) solids;Honey thick liquids Liquid Administration via Cup;No straw;Spoon Medication Administration Crushed with puree Compensations Minimize environmental distractions;Slow rate;Small sips/bites Postural Changes Seated upright at 90 degrees;Remain semi-upright after after feeds/meals (Comment)   CHL IP OTHER RECOMMENDATIONS 12/09/2020   Oral Care Recommendations Oral care QID Other Recommendations Order thickener from pharmacy   CHL IP  FOLLOW UP RECOMMENDATIONS 12/09/2020 Follow up Recommendations TBD   CHL IP FREQUENCY AND DURATION 12/09/2020 Speech Therapy Frequency (ACUTE ONLY) min 2x/week Treatment Duration 2 weeks      CHL IP ORAL PHASE 12/09/2020 Oral Phase Impaired  Oral - Honey Teaspoon Premature spillage;Other (Comment) Oral -  Honey Cup Premature spillage;Other (Comment);Piecemeal swallowing Oral - Nectar Teaspoon Other (Comment);Premature spillage Oral - Nectar Cup Premature spillage;Other (Comment);Piecemeal swallowing Oral - Puree Piecemeal swallowing;Premature spillage   CHL IP PHARYNGEAL PHASE 12/09/2020 Pharyngeal Phase Impaired Pharyngeal- Honey Teaspoon Delayed swallow initiation-vallecula;Pharyngeal residue - valleculae;Penetration/Aspiration during swallow Pharyngeal Material enters airway, remains ABOVE vocal cords then ejected out Pharyngeal- Honey Cup Delayed swallow initiation-pyriform sinuses;Pharyngeal residue - valleculae;Penetration/Aspiration during swallow Pharyngeal Material enters airway, remains ABOVE vocal cords then ejected out Pharyngeal- Nectar Teaspoon Delayed swallow initiation-pyriform sinuses;Pharyngeal residue - valleculae;Penetration/Aspiration during swallow Pharyngeal Material enters airway, passes BELOW cords without attempt by patient to eject out (silent aspiration) Pharyngeal- Nectar Cup Delayed swallow initiation-pyriform sinuses;Pharyngeal residue - valleculae;Penetration/Aspiration during swallow Pharyngeal Material enters airway, passes BELOW cords without attempt by patient to eject out (silent aspiration) Pharyngeal- Puree Delayed swallow initiation-vallecula;Pharyngeal residue - valleculae;Penetration/Aspiration during swallow Pharyngeal Material enters airway, remains ABOVE vocal cords and not ejected out  CHL IP CERVICAL ESOPHAGEAL PHASE 12/09/2020 Cervical Esophageal Phase [name], [facility] [place] Speech Language Pathologist Office: [phone] Shonna Chock 12/09/2020, 2:00 PM                Orson Eva, DO  [facility]  If 7PM-7AM, please contact night-coverage [email or web address] Password [email or web address] 01/07/2021, 6:15 PM   LOS: 13 days

## 2021-01-08 DIAGNOSIS — R338 Other retention of urine: Secondary | ICD-10-CM | POA: Diagnosis not present

## 2021-01-08 DIAGNOSIS — N401 Enlarged prostate with lower urinary tract symptoms: Secondary | ICD-10-CM | POA: Diagnosis not present

## 2021-01-08 DIAGNOSIS — E119 Type 2 diabetes mellitus without complications: Secondary | ICD-10-CM | POA: Diagnosis not present

## 2021-01-08 DIAGNOSIS — N390 Urinary tract infection, site not specified: Secondary | ICD-10-CM | POA: Diagnosis not present

## 2021-01-08 LAB — VITAMIN B12: Vitamin B-12: 575 pg/mL (ref 180–914)

## 2021-01-08 LAB — FOLATE: Folate: 10.4 ng/mL (ref >5.9)

## 2021-01-08 LAB — GLUCOSE, CAPILLARY
Glucose-Capillary: 152 mg/dL — ABNORMAL HIGH (ref 70–99)
Glucose-Capillary: 181 mg/dL — ABNORMAL HIGH (ref 70–99)
Glucose-Capillary: 148 mg/dL — ABNORMAL HIGH (ref 70–99)

## 2021-01-08 LAB — T4, FREE: Free T4: 1.57 ng/dL — ABNORMAL HIGH (ref 0.61–1.12)

## 2021-01-08 LAB — TSH: TSH: 3.159 u[IU]/mL (ref 0.350–4.500)

## 2021-01-08 NOTE — TOC Progression Note (Signed)
Transition of Care [facility]) - Progression Note    Patient Details  Name: Albert Garrison MRN: [id number] Date of Birth: 08/02/1945  Transition of Care [facility]) CM/SW Contact  Natasha Bence, LCSW Phone Number: 01/08/2021, 3:14 PM  Clinical Narrative:    CSW faxed required documents to [facility]. [facility] reported that they would be able to take patient once. Patient's brother completed admission paperwork [facility] admissions reported that they would also follow up with DSS to inquire if they are able to complete paperwork. Col[place]ta [place] with DSS reported that they did not file for custody because they discovered. Patent's Brother is HC POA. DSS not able to sign paperwork for [facility]. [place] reported that patient's brother has left for a trip and will not have access to cellular service until he returns on Monday. CSW attempted to reach patient's brother to inquire if he could sign paperwork and email documents to admissions. CSW noted phone was answered, but patient's brother did not respond and call was then ended. TOC to follow.      Barriers to Discharge: Unsafe home situation  Expected Discharge Plan and Services   In-house Referral: Clinical Social Work       Expected Discharge Date: 01/08/21                                     Social Determinants of Health (SDOH) Interventions    Readmission Risk Interventions Readmission Risk Prevention Plan 01/01/2021  Transportation Screening Complete  PCP or Specialist Appt within 3-5 Days Complete  HRI or [place] Complete  Social Work Consult for [place] Planning/Counseling Complete  Palliative Care Screening Not Applicable  Medication Review [employer] photographer) Complete  Some recent data might be hidden

## 2021-01-08 NOTE — Progress Notes (Signed)
Physical Therapy Treatment Patient Details Name: Albert Garrison MRN: [id number] DOB: March 07, 1946 Today's Date: 01/08/2021   History of Present Illness [name] is a 75 y.o. male with a history of type 2 diabetes, stage III chronic kidney disease, BPH, history of stroke, GERD, history of Cardiac Arrest.  The patient was recently admitted on 11/27/2020 and was discharged approximately 3 days ago from the hospital.  During that hospitalization, the patient went into cardiac arrest and had acute hypoxemic respiratory failure, he was intubated and ROSC was obtained after about 1 minute.  The patient presented this time with acute metabolic encephalopathy suspected to be related to UTI as well as some noted hematuria on presentation.  His urine cultures have demonstrated no growth, however he has had group A strep bacteremia noted and case was discussed with ID with recommendations to remain on oral cefdinir for 8 more days to complete 10-day course of treatment.  He has had improvement in leukocytosis as well as his mentation and has remained afebrile during the course of the    PT Comments    Patient does not require assist for mobility today. He demonstrates good sitting tolerance and sitting balance EOB while completing exercises. Ambulates without loss of balance with use of RW. Patient will benefit from continued physical therapy in hospital and recommended venue below to increase strength, balance, endurance for safe ADLs and gait.    Recommendations for follow up therapy are one component of a multi-disciplinary discharge planning process, led by the attending physician.  Recommendations may be updated based on patient status, additional functional criteria and insurance authorization.  Follow Up Recommendations  Supervision for mobility/OOB;Supervision - Intermittent     Equipment Recommendations  None recommended by PT    Recommendations for Other Services       Precautions /  Restrictions Precautions Precautions: None Restrictions Weight Bearing Restrictions: No     Mobility  Bed Mobility Overal bed mobility: Modified Independent             General bed mobility comments: increased time    Transfers Overall transfer level: Modified independent Equipment used: Rolling walker (2 wheeled) Transfers: Sit to/from Stand Sit to Stand: Supervision;Min guard            Ambulation/Gait Ambulation/Gait assistance: Modified independent (Device/Increase time) Gait Distance (Feet): 200 Feet Assistive device: Rolling walker (2 wheeled)   Gait velocity: Decreased   General Gait Details: RW for safety, slow labored movements, no LOB   Stairs             Wheelchair Mobility    Modified Rankin (Stroke Patients Only)       Balance Overall balance assessment: Needs assistance Sitting-balance support: Feet supported;No upper extremity supported Sitting balance-Leahy Scale: Good Sitting balance - Comments: seated at EOB   Standing balance support: Bilateral upper extremity supported Standing balance-Leahy Scale: Good Standing balance comment: good with RW                            Cognition Arousal/Alertness: Awake/alert Behavior During Therapy: WFL for tasks assessed/performed Overall Cognitive Status: No family/caregiver present to determine baseline cognitive functioning                                        Exercises General Exercises - Lower Extremity Long Arc Quad: Seated;AROM;Strengthening;Both;10 reps Hip Flexion/Marching:  Seated;AROM;Strengthening;Both;10 reps Toe Raises: Seated;AROM;Strengthening;Both;10 reps Heel Raises: Seated;AROM;Strengthening;Both;10 reps    General Comments        Pertinent Vitals/Pain Pain Assessment: No/denies pain    Home Living                      Prior Function            PT Goals (current goals can now be found in the care plan section) Acute  Rehab PT Goals Patient Stated Goal: return home PT Goal Formulation: With patient Time For Goal Achievement: 01/11/21 Potential to Achieve Goals: Good Progress towards PT goals: Progressing toward goals    Frequency    Min 3X/week      PT Plan Current plan remains appropriate    Co-evaluation              AM-PAC PT "6 Clicks" Mobility   Outcome Measure  Help needed turning from your back to your side while in a flat bed without using bedrails?: None Help needed moving from lying on your back to sitting on the side of a flat bed without using bedrails?: None Help needed moving to and from a bed to a chair (including a wheelchair)?: None Help needed standing up from a chair using your arms (e.g., wheelchair or bedside chair)?: None Help needed to walk in hospital room?: A Little Help needed climbing 3-5 steps with a railing? : A Lot 6 Click Score: 21    End of Session Equipment Utilized During Treatment: Gait belt Activity Tolerance: Patient tolerated treatment well Patient left: with bed alarm set;with call bell/phone within reach;in bed Nurse Communication: Mobility status PT Visit Diagnosis: Unsteadiness on feet (R26.81);Muscle weakness (generalized) (M62.81)     Time: 2637-8588 PT Time Calculation (min) (ACUTE ONLY): 15 min  Charges:  $Therapeutic Activity: 8-22 mins                     2:37 PM, 01/08/21 Mearl Latin PT, DPT Physical Therapist at [facility]

## 2021-01-08 NOTE — Discharge Summary (Addendum)
Physician Discharge Summary  Albert Garrison LXB:[id number] DOB: 12-15-1945 DOA: 12/25/2020  PCP: Caprice Renshaw, MD  Admit date: 12/25/2020 Discharge date: 01/08/2021  Admitted From: Home Disposition: ALF  Recommendations for Outpatient Follow-up:  Follow up with PCP in 1-2 weeks Please obtain BMP/CBC in one week Please have speech therapy continue to follow  and evaluate patient for safest, lease restrictive diet   Home Health:HHPT   Discharge Condition: Stable CODE STATUS: FULL Diet recommendation: Heart Healthy / Carb Modified --dysphagia 2 with thin liquid   Brief/Interim Summary: Albert Garrison is a 75 y.o. male with a history of type 2 diabetes, BPH, history of stroke, GERD, history of Cardiac Arrest.  The patient was recently admitted on 11/27/2020 and was discharged approximately 3 days prior to presentation from the hospital.  During that hospitalization, the patient went into cardiac arrest and had acute hypoxemic respiratory failure, he was intubated and ROSC was obtained after about 1 minute.  The patient now presents with hematuria.  He has been admitted with some acute metabolic encephalopathy in the setting of catheter associated UTI.  He continues to have some confusion, but he does not qualify for SNF and therefore is unsafe for discharge to home.  APS evaluation pending as well as guardianship.  He was treated for group A strep bacteremia and completed a 10-day course of Rocephin.  No noted agitation for the past 48 hours, labs and vital signs have been stable.  CSW is assisting with DC planning.  Plan for discharge to memory care assisted living facility.   01/05/2021: He was seen and examined this morning.  He was alert and pleasantly confused.  He had no new complaints.   01/07/21:  remains pleasantly confused.  No complaints.  Remains medically stable. Doing better with PT.  CXR neg--personally reviewed.  COVID-19 negative  Discharge Diagnoses:  Acute metabolic  encephalopathy, persistent Plan to discharge to memory care assisted living facility. He completed 10-day course of IV antibiotics Rocephin for Group A strep bacteremia and UTI on 01/03/21. -now remains pleasantly confused--suspect he is near baseline -He has been A&O x 2 and remains calm and re-directable -B12, folate, TSH normal   BPH with chronic urinary retention, Foley catheter in place. Tried voiding trial 9/27, but he continued to have issues with urine retention -Foley catheter replaced on 9/27 and he will need outpatient urology follow-up Continue Flomax Continue to monitor urine output. -referral sent to [facility]   Mild euvolemic hyponatremia Serum sodium 134 Monitor for now Encourage oral intake   Treated Group A strep bacteremia Completed Rocephin on 01/03/2021. -No need to evaluate further for endocarditis -Discussed case with Dr. Gale Journey with ID -Repeat blood cultures ordered on 9/29 with no growth final.   Type 2 diabetes with hyperglycemia, uncontrolled Last hemoglobin A1c 8.6 on 11/27/20 -DYS 2 diet Continue insulin sliding scale. -restart metformin after d/c   History of stroke -Continue aspirin and Lipitor -DYS 2 diet   History of NSTEMI -Continue aspirin, Lipitor, beta-blocker  -no chest pain presently  HTN -continue amlodipine, metoprolol   Discharge Instructions  Discharge Instructions     Ambulatory referral to Urology   Complete by: As directed    Diet - low sodium heart healthy   Complete by: As directed    Increase activity slowly   Complete by: As directed       Allergies as of 01/08/2021   No Known Allergies      Medication List  STOP taking these medications    insulin glargine 100 UNIT/ML injection Commonly known as: LANTUS       TAKE these medications    amLODipine 10 MG tablet Commonly known as: NORVASC Take 1 tablet (10 mg total) by mouth daily.   aspirin 81 MG chewable tablet Chew 1 tablet (81 mg  total) by mouth daily.   atorvastatin 40 MG tablet Commonly known as: LIPITOR Take 40 mg by mouth daily.   haloperidol 0.5 MG tablet Commonly known as: HALDOL Take 1 tablet (0.5 mg total) by mouth 2 (two) times daily.   insulin aspart 100 UNIT/ML injection Commonly known as: novoLOG Inject 0-15 Units into the skin 3 (three) times daily with meals. CBG < 70: Implement Hypoglycemia Standing Orders and refer to Hypoglycemia Standing Orders sidebar report  CBG 70 - 120: 0 units  CBG 121 - 150: 2 units  CBG 151 - 200: 3 units  CBG 201 - 250: 5 units  CBG 251 - 300: 8 units  CBG 301 - 350: 11 units  CBG 351 - 400: 15 units   metFORMIN 500 MG tablet Commonly known as: GLUCOPHAGE Take 500 mg by mouth daily with breakfast.   metoprolol tartrate 25 MG tablet Commonly known as: LOPRESSOR Take 1 tablet (25 mg total) by mouth 2 (two) times daily.   multivitamin with minerals Tabs tablet Take 1 tablet by mouth daily.   tamsulosin 0.4 MG Caps capsule Commonly known as: FLOMAX Take 1 capsule (0.4 mg total) by mouth daily after supper.       ASK your doctor about these medications    acetaminophen 500 MG tablet Commonly known as: TYLENOL Take 2 tablets (1,000 mg total) by mouth every 8 (eight) hours as needed for up to 10 days. Ask about: Should I take this medication?        Contact information for follow-up providers     Caprice Renshaw, MD. Schedule an appointment as soon as possible for a visit .   Specialty: Internal Medicine Contact information: [place] [id number] [place]. Schedule an appointment as soon as possible for a visit .   Specialty: Urology Contact information: [address] [place] [phone]             Contact information for after-discharge care     Destination     [facility] .   Service: Assisted Living Contact  information: [place] [phone]                    No Known Allergies  Consultations: none   Procedures/Studies: DG CHEST PORT 1 VIEW  Result Date: 01/07/2021 CLINICAL DATA:  TB screening. EXAM: PORTABLE CHEST 1 VIEW COMPARISON:  Chest x-ray 12/25/2020. FINDINGS: The heart size and mediastinal contours are within normal limits. Both lungs are clear. Cervical spinal fusion plate is present. The visualized skeletal structures are otherwise unremarkable. IMPRESSION: No active disease. Electronically Signed   By: Ronney Asters M.D.   On: 01/07/2021 15:46   DG Chest Port 1 View  Result Date: 12/25/2020 CLINICAL DATA:  Hematuria and fever. EXAM: PORTABLE CHEST 1 VIEW COMPARISON:  December 01, 2020 FINDINGS: Mild atelectasis and/or infiltrate is seen within the bilateral lung bases. There is no evidence of a pleural effusion or pneumothorax. The heart size and mediastinal contours are within normal  limits. Radiopaque fusion plates and screws are seen overlying the cervical spine. IMPRESSION: Mild bibasilar atelectasis and/or infiltrate. Electronically Signed   By: Virgina Norfolk M.D.   On: 12/25/2020 16:56   DG Swallowing Func-Speech Pathology  Result Date: 12/17/2020 Table formatting from the original result was not included. Objective Swallowing Evaluation: Type of Study: MBS-Modified Barium Swallow Study  Patient Details Name: DEONTAE ROBSON MRN: [id number] Date of Birth: 1945/04/16 Today's Date: 12/17/2020 Time: SLP Start Time (ACUTE ONLY): 2130 -SLP Stop Time (ACUTE ONLY): 8657 SLP Time Calculation (min) (ACUTE ONLY): 15 min Past Medical History: Past Medical History: Diagnosis Date  Arthritis   BPH (benign prostatic hyperplasia)   CKD (chronic kidney disease), stage II   Complicated UTI (urinary tract infection) 03/2014  Depression   GERD (gastroesophageal reflux disease)   History of pneumonia   History of stroke   HOH (hard of hearing)    Hypertension   Hyponatremia   Lower GI bleed 2017  a. ? due to polyp.  NSVT (nonsustained ventricular tachycardia) (HCC)   Rheumatic fever   Childhood  Sleep apnea   Does not use CPAP  Type 2 diabetes mellitus ([place])  Past Surgical History: Past Surgical History: Procedure Laterality Date  CARPAL TUNNEL RELEASE Left 2010  CARPAL TUNNEL RELEASE Right 07/13/2017  Procedure: CARPAL TUNNEL RELEASE;  Surgeon: Carole Civil, MD;  Location: [facility];  Service: Orthopedics;  Laterality: Right;  CERVICAL SPINE SURGERY  2010  COLONOSCOPY WITH PROPOFOL N/A 11/30/2015  Procedure: COLONOSCOPY WITH PROPOFOL;  Surgeon: Daneil Dolin, MD;  Location: [facility];  Service: Endoscopy;  Laterality: N/A;  KNEE ARTHROSCOPY  [place] surgery - broken nose    POLYPECTOMY  11/30/2015  Procedure: POLYPECTOMY;  Surgeon: Daneil Dolin, MD;  Location: [facility];  Service: Endoscopy;;  polyp at ascending colon, rectal polyp  TRANSURETHRAL INCISION OF PROSTATE N/A 01/20/2015  Procedure: TRANSURETHRAL INCISION OF THE PROSTATE (TUIP);  Surgeon: Irine Seal, MD;  Location: [facility];  Service: Urology;  Laterality: N/A; HPI: 75 y.o. male presented to [facility] ED with diarrhea, decreased p.o. intake and nausea. Pt admitted with acute encephalopathy and symptomatic hyponatremia. Abdominal distension 8/27. CT abd/pelvis (8/28) (+) SBO and NGT placed. 8/28 pt went into cardiac arrest, concern for possible STEMI. Transferred to [facility] for further treatment. CT head (8/28) revealed no acute intracranial abnormality. Chest xray (8/30) noted "appearance of airspace disease in the right middle lobe. Small left pleural effusion". Hosptial admission complicated by continued impulsivity and agitation with pt pulling out multiple NGT's despite restraints.  ETT: 8/28-8/30. PMHx significant for CKD, BPH, DMII, Hx of CVA, HTN, and GERD. SLE (08/23/2017) revealed cognitive communication deficits post CVA.  Subjective: Pt seen in radiology for  instrumental assessment of swallow function and safety. No family present. Assessment / Plan / Recommendation CHL IP CLINICAL IMPRESSIONS 12/17/2020 Clinical Impression Pt presents with ongoing dysphagia with several factors that could be persistently impacting effective swallowing. Pt has a prior ACDF with hardware present from C3-C6. His brother denies any knowledge of baseline impairment, but suspect pt may have had a mild change in swallowing since that surgery that he compensated for and can no longer discuss. He is noted to have decreased epiglottic deflection, with epiglottic movement restricted by posterior pharyngeal wall, keeping vestibule slightly open at the height of laryngeal elevation. Pt has instances of bolus penetratating the vestibule before hyoid excursion with thin and nectar thick liquids squeezing between the  posterior process of the glottis during hyoid excursion. Pts sensation is absent and voice is slightly hoarse. Question the integrity of glottic competence given this finding. Pt was unable to sustain a chin tuck or achieve any other strategies gievn mentation and decreased neck ROM. Recommend pt continue a finely chopped diet given rapid intake of solids with partial mastication and spillage of solids to lower pharynx during ongoing masticaiton. Pt should also continue honey thick liquids. Discussed findings with brother. Recommend a repeat MBS a few weeks after NG tube removal for potential upgrade as presence of NG could be another complicating factor. If there is no improvement in airway protection with liquids after another 14 days approximately, would recommend referral to ENT to examine larynx directly. SLP Visit Diagnosis Dysphagia, oropharyngeal phase (R13.12) Attention and concentration deficit following -- Frontal lobe and executive function deficit following -- Impact on safety and function Moderate aspiration risk   CHL IP TREATMENT RECOMMENDATION 12/17/2020 Treatment  Recommendations Therapy as outlined in treatment plan below   Prognosis 12/17/2020 Prognosis for Safe Diet Advancement Good Barriers to Reach Goals -- Barriers/Prognosis Comment -- CHL IP DIET RECOMMENDATION 12/17/2020 SLP Diet Recommendations Dysphagia 2 (Fine chop) solids;Honey thick liquids Liquid Administration via Cup;Straw;Spoon Medication Administration Crushed with puree Compensations Minimize environmental distractions;Slow rate;Small sips/bites Postural Changes Remain semi-upright after after feeds/meals (Comment)   CHL IP OTHER RECOMMENDATIONS 12/17/2020 Recommended Consults -- Oral Care Recommendations Oral care BID Other Recommendations --   CHL IP FOLLOW UP RECOMMENDATIONS 12/17/2020 Follow up Recommendations Skilled Nursing facility   [facility] IP FREQUENCY AND DURATION 12/17/2020 Speech Therapy Frequency (ACUTE ONLY) min 2x/week Treatment Duration 2 weeks      CHL IP ORAL PHASE 12/17/2020 Oral Phase Impaired Oral - Pudding Teaspoon -- Oral - Pudding Cup -- Oral - Honey Teaspoon NT Oral - Honey Cup WFL Oral - Nectar Teaspoon WFL Oral - Nectar Cup WFL Oral - Nectar Straw WFL Oral - Thin Teaspoon -- Oral - Thin Cup WFL Oral - Thin Straw WFL Oral - Puree WFL Oral - Mech Soft -- Oral - Regular Premature spillage;Decreased bolus cohesion;Delayed oral transit;Holding of bolus Oral - Multi-Consistency -- Oral - Pill -- Oral Phase - Comment --  CHL IP PHARYNGEAL PHASE 12/17/2020 Pharyngeal Phase Impaired Pharyngeal- Pudding Teaspoon -- Pharyngeal -- Pharyngeal- Pudding Cup -- Pharyngeal -- Pharyngeal- Honey Teaspoon NT Pharyngeal -- Pharyngeal- Honey Cup WFL Pharyngeal -- Pharyngeal- Nectar Teaspoon NT Pharyngeal -- Pharyngeal- Nectar Cup Penetration/Aspiration before swallow;Penetration/Aspiration during swallow;Reduced epiglottic inversion;Reduced airway/laryngeal closure;Trace aspiration Pharyngeal Material enters airway, CONTACTS cords and not ejected out;Material enters airway, passes BELOW cords without attempt by  patient to eject out (silent aspiration);Material enters airway, passes BELOW cords then ejected out Pharyngeal- Nectar Straw Penetration/Aspiration before swallow;Penetration/Aspiration during swallow;Reduced epiglottic inversion;Reduced airway/laryngeal closure;Trace aspiration Pharyngeal Material enters airway, CONTACTS cords and not ejected out;Material enters airway, passes BELOW cords without attempt by patient to eject out (silent aspiration) Pharyngeal- Thin Teaspoon -- Pharyngeal -- Pharyngeal- Thin Cup Reduced epiglottic inversion;Penetration/Aspiration before swallow;Penetration/Aspiration during swallow;Trace aspiration;Moderate aspiration;Reduced airway/laryngeal closure Pharyngeal Material enters airway, passes BELOW cords without attempt by patient to eject out (silent aspiration);Material enters airway, CONTACTS cords and then ejected out;Material enters airway, CONTACTS cords and not ejected out Pharyngeal- Thin Straw Reduced epiglottic inversion;Penetration/Aspiration before swallow;Penetration/Aspiration during swallow;Trace aspiration;Moderate aspiration;Reduced airway/laryngeal closure Pharyngeal Material enters airway, passes BELOW cords without attempt by patient to eject out (silent aspiration);Material enters airway, CONTACTS cords and then ejected out;Material enters airway, CONTACTS cords and not ejected out Pharyngeal- Puree  WFL Pharyngeal -- Pharyngeal- Mechanical Soft -- Pharyngeal -- Pharyngeal- Regular Pharyngeal residue - valleculae;Pharyngeal residue - pyriform Pharyngeal -- Pharyngeal- Multi-consistency -- Pharyngeal -- Pharyngeal- Pill -- Pharyngeal -- Pharyngeal Comment --  CHL IP CERVICAL ESOPHAGEAL PHASE 12/09/2020 Cervical Esophageal Phase WFL Pudding Teaspoon -- Pudding Cup -- Honey Teaspoon -- Honey Cup -- Nectar Teaspoon -- Nectar Cup -- Nectar Straw -- Thin Teaspoon -- Thin Cup -- Thin Straw -- Puree -- Mechanical Soft -- Regular -- Multi-consistency -- Pill -- Cervical  Esophageal Comment -- Lynann Beaver 12/17/2020, 11:52 AM              DG Swallowing Func-Speech Pathology  Result Date: 12/09/2020 Table formatting from the original result was not included. Objective Swallowing Evaluation: Type of Study: MBS-Modified Barium Swallow Study  Patient Details Name: ALMIN LIVINGSTONE MRN: [id number] Date of Birth: October 20, 1945 Today's Date: 12/09/2020 Time: SLP Start Time (ACUTE ONLY): 1300 -SLP Stop Time (ACUTE ONLY): 1325 SLP Time Calculation (min) (ACUTE ONLY): 25 min Past Medical History: Past Medical History: Diagnosis Date  Arthritis   BPH (benign prostatic hyperplasia)   CKD (chronic kidney disease), stage II   Complicated UTI (urinary tract infection) 03/2014  Depression   GERD (gastroesophageal reflux disease)   History of pneumonia   History of stroke   HOH (hard of hearing)   Hypertension   Hyponatremia   Lower GI bleed 2017  a. ? due to polyp.  NSVT (nonsustained ventricular tachycardia) (HCC)   Rheumatic fever   Childhood  Sleep apnea   Does not use CPAP  Type 2 diabetes mellitus ([place])  Past Surgical History: Past Surgical History: Procedure Laterality Date  CARPAL TUNNEL RELEASE Left 2010  CARPAL TUNNEL RELEASE Right 07/13/2017  Procedure: CARPAL TUNNEL RELEASE;  Surgeon: Carole Civil, MD;  Location: [facility];  Service: Orthopedics;  Laterality: Right;  CERVICAL SPINE SURGERY  2010  COLONOSCOPY WITH PROPOFOL N/A 11/30/2015  Procedure: COLONOSCOPY WITH PROPOFOL;  Surgeon: Daneil Dolin, MD;  [facility];  Service: Endoscopy;  Laterality: N/A;  KNEE ARTHROSCOPY  [place] surgery - broken nose    POLYPECTOMY  11/30/2015  Procedure: POLYPECTOMY;  Surgeon: Daneil Dolin, MD;  [facility];  Service: Endoscopy;;  polyp at ascending colon, rectal polyp  TRANSURETHRAL INCISION OF PROSTATE N/A 01/20/2015  Procedure: TRANSURETHRAL INCISION OF THE PROSTATE (TUIP);  Surgeon: Irine Seal, MD;  [facility]: [facility];  Service:  Urology;  Laterality: N/A; HPI: 75 y.o. male presented to [facility] ED with diarrhea, decreased p.o. intake and nausea. Pt admitted with acute encephalopathy and symptomatic hyponatremia. Abdominal distension 8/27. CT abd/pelvis (8/28) (+) SBO and NGT placed. 8/28 pt went into cardiac arrest, concern for possible STEMI. Transferred to [facility] for further treatment. CT head (8/28) revealed no acute intracranial abnormality. Chest xray (8/30) noted "appearance of airspace disease in the right middle lobe. Small left pleural effusion". Hosptial admission complicated by continued impulsivity and agitation with pt pulling out multiple NGT's despite restraints.  ETT: 8/28-8/30. PMHx significant for CKD, BPH, DMII, Hx of CVA, HTN, and GERD. SLE (08/23/2017) revealed cognitive communication deficits post CVA.  Subjective: Pt seen in radiology for instrumental assessment of swallow function and safety. No family present. Assessment / Plan / Recommendation CHL IP CLINICAL IMPRESSIONS 12/09/2020 Clinical Impression Pt presents with a moderate-severe oral and pharyngeal dysphagia, characterized as follows: Orally, pt demonstrates poor bolus formation and control, with premature spillage over the tongue base across  consistencies. Pt was noted to piecemeal several boluses, swallowing small amounts of the bolus at one time. Oral residue was also noted, which then spilled posteriorly into the vallecular sinus. Pharyngeal swallow is characterized by delayed swallow reflex, with trigger occurring at the level of the pyriform sinuses on nectar and honey thick liquids (cup sip), and at the vallecular sinus on honey (tsp) and puree. Epiglottic inversion was reduced, in large part by the presence of the Cortrak feeding tube. This resulted in vallecular residue across consistencies. This increases aspiration risk, as residue thins with saliva, or is added to by subsequent boluses and can potentially spill over the vallecular sinus and into the open  airway. SILENT ASPIRATION was seen on nectar thick liquids via teaspoon and cup sip. Flash penetration was noted during the swallow of honey thick liquids and puree. Pt was noted to be impulsive with cup sips, and has difficulty following commands due to confusion and hearing loss. Pt is at significantly high risk of aspiration of any consistency given. Puree and honey thick liquids may be presented in small bites/sips with 1:1 close supervision. Meds should be crushed in puree or given via Cortrak.  SLP Visit Diagnosis Dysphagia, oropharyngeal phase (R13.12)     Impact on safety and function Severe aspiration risk   CHL IP TREATMENT RECOMMENDATION 12/09/2020 Treatment Recommendations Therapy as outlined in treatment plan below   Prognosis 12/09/2020 Prognosis for Safe Diet Advancement Fair Barriers to Reach Goals Cognitive deficits   CHL IP DIET RECOMMENDATION 12/09/2020 SLP Diet Recommendations Dysphagia 1 (Puree) solids;Honey thick liquids Liquid Administration via Cup;No straw;Spoon Medication Administration Crushed with puree Compensations Minimize environmental distractions;Slow rate;Small sips/bites Postural Changes Seated upright at 90 degrees;Remain semi-upright after after feeds/meals (Comment)   CHL IP OTHER RECOMMENDATIONS 12/09/2020   Oral Care Recommendations Oral care QID Other Recommendations Order thickener from pharmacy   CHL IP FOLLOW UP RECOMMENDATIONS 12/09/2020 Follow up Recommendations TBD   CHL IP FREQUENCY AND DURATION 12/09/2020 Speech Therapy Frequency (ACUTE ONLY) min 2x/week Treatment Duration 2 weeks      CHL IP ORAL PHASE 12/09/2020 Oral Phase Impaired  Oral - Honey Teaspoon Premature spillage;Other (Comment) Oral - Honey Cup Premature spillage;Other (Comment);Piecemeal swallowing Oral - Nectar Teaspoon Other (Comment);Premature spillage Oral - Nectar Cup Premature spillage;Other (Comment);Piecemeal swallowing Oral - Puree Piecemeal swallowing;Premature spillage   CHL IP PHARYNGEAL PHASE 12/09/2020  Pharyngeal Phase Impaired Pharyngeal- Honey Teaspoon Delayed swallow initiation-vallecula;Pharyngeal residue - valleculae;Penetration/Aspiration during swallow Pharyngeal Material enters airway, remains ABOVE vocal cords then ejected out Pharyngeal- Honey Cup Delayed swallow initiation-pyriform sinuses;Pharyngeal residue - valleculae;Penetration/Aspiration during swallow Pharyngeal Material enters airway, remains ABOVE vocal cords then ejected out Pharyngeal- Nectar Teaspoon Delayed swallow initiation-pyriform sinuses;Pharyngeal residue - valleculae;Penetration/Aspiration during swallow Pharyngeal Material enters airway, passes BELOW cords without attempt by patient to eject out (silent aspiration) Pharyngeal- Nectar Cup Delayed swallow initiation-pyriform sinuses;Pharyngeal residue - valleculae;Penetration/Aspiration during swallow Pharyngeal Material enters airway, passes BELOW cords without attempt by patient to eject out (silent aspiration) Pharyngeal- Puree Delayed swallow initiation-vallecula;Pharyngeal residue - valleculae;Penetration/Aspiration during swallow Pharyngeal Material enters airway, remains ABOVE vocal cords and not ejected out  CHL IP CERVICAL ESOPHAGEAL PHASE 12/09/2020 Cervical Esophageal Phase [facility] Celia B. [name] [facility], CCC-SLP Speech Language Pathologist Office: [phone] Shonna Chock 12/09/2020, 2:00 PM                   Discharge Exam: Vitals:   01/07/21 1356 01/07/21 1947  BP: 106/65 114/65  Pulse: (!) 58 62  Resp: 19 16  Temp: 97.8 F (36.6 C) 98.1 F (36.7 C)  SpO2: 97% 96%   Vitals:   01/06/21 2059 01/07/21 0552 01/07/21 1356 01/07/21 1947  BP: 120/63 128/63 106/65 114/65  Pulse: 62 (!) 52 (!) 58 62  Resp: 18 15 19 16   Temp: 98.2 F (36.8 C) 97.8 F (36.6 C) 97.8 F (36.6 C) 98.1 F (36.7 C)  TempSrc: Oral Oral Oral Oral  SpO2: 98% 97% 97% 96%  Weight:      Height:        General: Pt is alert, awake, not in acute distress Cardiovascular: RRR, S1/S2  +, no rubs, no gallops Respiratory: CTA bilaterally, no wheezing, no rhonchi Abdominal: Soft, NT, ND, bowel sounds + Extremities: no edema, no cyanosis   The results of significant diagnostics from this hospitalization (including imaging, microbiology, ancillary and laboratory) are listed below for reference.    Significant Diagnostic Studies: DG CHEST PORT 1 VIEW  Result Date: 01/07/2021 CLINICAL DATA:  TB screening. EXAM: PORTABLE CHEST 1 VIEW COMPARISON:  Chest x-ray 12/25/2020. FINDINGS: The heart size and mediastinal contours are within normal limits. Both lungs are clear. Cervical spinal fusion plate is present. The visualized skeletal structures are otherwise unremarkable. IMPRESSION: No active disease. Electronically Signed   By: Ronney Asters M.D.   On: 01/07/2021 15:46   DG Chest Port 1 View  Result Date: 12/25/2020 CLINICAL DATA:  Hematuria and fever. EXAM: PORTABLE CHEST 1 VIEW COMPARISON:  December 01, 2020 FINDINGS: Mild atelectasis and/or infiltrate is seen within the bilateral lung bases. There is no evidence of a pleural effusion or pneumothorax. The heart size and mediastinal contours are within normal limits. Radiopaque fusion plates and screws are seen overlying the cervical spine. IMPRESSION: Mild bibasilar atelectasis and/or infiltrate. Electronically Signed   By: Virgina Norfolk M.D.   On: 12/25/2020 16:56   DG Swallowing Func-Speech Pathology  Result Date: 12/17/2020 Table formatting from the original result was not included. Objective Swallowing Evaluation: Type of Study: MBS-Modified Barium Swallow Study  Patient Details Name: EMMANUELL KANTZ MRN: [id number] Date of Birth: 1946-03-02 Today's Date: 12/17/2020 Time: SLP Start Time (ACUTE ONLY): 9562 -SLP Stop Time (ACUTE ONLY): 1308 SLP Time Calculation (min) (ACUTE ONLY): 15 min Past Medical History: Past Medical History: Diagnosis Date  Arthritis   BPH (benign prostatic hyperplasia)   CKD (chronic kidney disease), stage II    Complicated UTI (urinary tract infection) 03/2014  Depression   GERD (gastroesophageal reflux disease)   History of pneumonia   History of stroke   HOH (hard of hearing)   Hypertension   Hyponatremia   Lower GI bleed 2017  a. ? due to polyp.  NSVT (nonsustained ventricular tachycardia) (HCC)   Rheumatic fever   Childhood  Sleep apnea   Does not use CPAP  Type 2 diabetes mellitus ([place])  Past Surgical History: Past Surgical History: Procedure Laterality Date  CARPAL TUNNEL RELEASE Left 2010  CARPAL TUNNEL RELEASE Right 07/13/2017  Procedure: CARPAL TUNNEL RELEASE;  Surgeon: Carole Civil, MD;  Location: [facility];  Service: Orthopedics;  Laterality: Right;  CERVICAL SPINE SURGERY  2010  COLONOSCOPY WITH PROPOFOL N/A 11/30/2015  Procedure: COLONOSCOPY WITH PROPOFOL;  Surgeon: Daneil Dolin, MD;  [facility];  Service: Endoscopy;  Laterality: N/A;  KNEE ARTHROSCOPY  [place] surgery - broken nose    POLYPECTOMY  11/30/2015  Procedure: POLYPECTOMY;  Surgeon: Daneil Dolin, MD;  [facility];  Service: Endoscopy;;  polyp at ascending colon, rectal polyp  TRANSURETHRAL INCISION OF PROSTATE N/A 01/20/2015  Procedure: TRANSURETHRAL INCISION OF THE PROSTATE (TUIP);  Surgeon: Irine Seal, MD;  Location: [facility];  Service: Urology;  Laterality: N/A; HPI: 75 y.o. male presented to [facility] ED with diarrhea, decreased p.o. intake and nausea. Pt admitted with acute encephalopathy and symptomatic hyponatremia. Abdominal distension 8/27. CT abd/pelvis (8/28) (+) SBO and NGT placed. 8/28 pt went into cardiac arrest, concern for possible STEMI. Transferred to [facility] for further treatment. CT head (8/28) revealed no acute intracranial abnormality. Chest xray (8/30) noted "appearance of airspace disease in the right middle lobe. Small left pleural effusion". Hosptial admission complicated by continued impulsivity and agitation with pt pulling out multiple NGT's despite restraints.  ETT:  8/28-8/30. PMHx significant for CKD, BPH, DMII, Hx of CVA, HTN, and GERD. SLE (08/23/2017) revealed cognitive communication deficits post CVA.  Subjective: Pt seen in radiology for instrumental assessment of swallow function and safety. No family present. Assessment / Plan / Recommendation CHL IP CLINICAL IMPRESSIONS 12/17/2020 Clinical Impression Pt presents with ongoing dysphagia with several factors that could be persistently impacting effective swallowing. Pt has a prior ACDF with hardware present from C3-C6. His brother denies any knowledge of baseline impairment, but suspect pt may have had a mild change in swallowing since that surgery that he compensated for and can no longer discuss. He is noted to have decreased epiglottic deflection, with epiglottic movement restricted by posterior pharyngeal wall, keeping vestibule slightly open at the height of laryngeal elevation. Pt has instances of bolus penetratating the vestibule before hyoid excursion with thin and nectar thick liquids squeezing between the posterior process of the glottis during hyoid excursion. Pts sensation is absent and voice is slightly hoarse. Question the integrity of glottic competence given this finding. Pt was unable to sustain a chin tuck or achieve any other strategies gievn mentation and decreased neck ROM. Recommend pt continue a finely chopped diet given rapid intake of solids with partial mastication and spillage of solids to lower pharynx during ongoing masticaiton. Pt should also continue honey thick liquids. Discussed findings with brother. Recommend a repeat MBS a few weeks after NG tube removal for potential upgrade as presence of NG could be another complicating factor. If there is no improvement in airway protection with liquids after another 14 days approximately, would recommend referral to ENT to examine larynx directly. SLP Visit Diagnosis Dysphagia, oropharyngeal phase (R13.12) Attention and concentration deficit  following -- Frontal lobe and executive function deficit following -- Impact on safety and function Moderate aspiration risk   CHL IP TREATMENT RECOMMENDATION 12/17/2020 Treatment Recommendations Therapy as outlined in treatment plan below   Prognosis 12/17/2020 Prognosis for Safe Diet Advancement Good Barriers to Reach Goals -- Barriers/Prognosis Comment -- CHL IP DIET RECOMMENDATION 12/17/2020 SLP Diet Recommendations Dysphagia 2 (Fine chop) solids;Honey thick liquids Liquid Administration via Cup;Straw;Spoon Medication Administration Crushed with puree Compensations Minimize environmental distractions;Slow rate;Small sips/bites Postural Changes Remain semi-upright after after feeds/meals (Comment)   CHL IP OTHER RECOMMENDATIONS 12/17/2020 Recommended Consults -- Oral Care Recommendations Oral care BID Other Recommendations --   CHL IP FOLLOW UP RECOMMENDATIONS 12/17/2020 Follow up Recommendations Skilled Nursing facility   [facility] IP FREQUENCY AND DURATION 12/17/2020 Speech Therapy Frequency (ACUTE ONLY) min 2x/week Treatment Duration 2 weeks      CHL IP ORAL PHASE 12/17/2020 Oral Phase Impaired Oral - Pudding Teaspoon -- Oral - Pudding Cup -- Oral - Honey Teaspoon NT Oral - Honey Cup WFL Oral - Nectar  Teaspoon WFL Oral - Nectar Cup WFL Oral - Nectar Straw WFL Oral - Thin Teaspoon -- Oral - Thin Cup WFL Oral - Thin Straw WFL Oral - Puree WFL Oral - Mech Soft -- Oral - Regular Premature spillage;Decreased bolus cohesion;Delayed oral transit;Holding of bolus Oral - Multi-Consistency -- Oral - Pill -- Oral Phase - Comment --  CHL IP PHARYNGEAL PHASE 12/17/2020 Pharyngeal Phase Impaired Pharyngeal- Pudding Teaspoon -- Pharyngeal -- Pharyngeal- Pudding Cup -- Pharyngeal -- Pharyngeal- Honey Teaspoon NT Pharyngeal -- Pharyngeal- Honey Cup WFL Pharyngeal -- Pharyngeal- Nectar Teaspoon NT Pharyngeal -- Pharyngeal- Nectar Cup Penetration/Aspiration before swallow;Penetration/Aspiration during swallow;Reduced epiglottic  inversion;Reduced airway/laryngeal closure;Trace aspiration Pharyngeal Material enters airway, CONTACTS cords and not ejected out;Material enters airway, passes BELOW cords without attempt by patient to eject out (silent aspiration);Material enters airway, passes BELOW cords then ejected out Pharyngeal- Nectar Straw Penetration/Aspiration before swallow;Penetration/Aspiration during swallow;Reduced epiglottic inversion;Reduced airway/laryngeal closure;Trace aspiration Pharyngeal Material enters airway, CONTACTS cords and not ejected out;Material enters airway, passes BELOW cords without attempt by patient to eject out (silent aspiration) Pharyngeal- Thin Teaspoon -- Pharyngeal -- Pharyngeal- Thin Cup Reduced epiglottic inversion;Penetration/Aspiration before swallow;Penetration/Aspiration during swallow;Trace aspiration;Moderate aspiration;Reduced airway/laryngeal closure Pharyngeal Material enters airway, passes BELOW cords without attempt by patient to eject out (silent aspiration);Material enters airway, CONTACTS cords and then ejected out;Material enters airway, CONTACTS cords and not ejected out Pharyngeal- Thin Straw Reduced epiglottic inversion;Penetration/Aspiration before swallow;Penetration/Aspiration during swallow;Trace aspiration;Moderate aspiration;Reduced airway/laryngeal closure Pharyngeal Material enters airway, passes BELOW cords without attempt by patient to eject out (silent aspiration);Material enters airway, CONTACTS cords and then ejected out;Material enters airway, CONTACTS cords and not ejected out Pharyngeal- Puree WFL Pharyngeal -- Pharyngeal- Mechanical Soft -- Pharyngeal -- Pharyngeal- Regular Pharyngeal residue - valleculae;Pharyngeal residue - pyriform Pharyngeal -- Pharyngeal- Multi-consistency -- Pharyngeal -- Pharyngeal- Pill -- Pharyngeal -- Pharyngeal Comment --  CHL IP CERVICAL ESOPHAGEAL PHASE 12/09/2020 Cervical Esophageal Phase WFL Pudding Teaspoon -- Pudding Cup -- Honey  Teaspoon -- Honey Cup -- Nectar Teaspoon -- Nectar Cup -- Nectar Straw -- Thin Teaspoon -- Thin Cup -- Thin Straw -- Puree -- Mechanical Soft -- Regular -- Multi-consistency -- Pill -- Cervical Esophageal Comment -- Lynann Beaver 12/17/2020, 11:52 AM              DG Swallowing Func-Speech Pathology  Result Date: 12/09/2020 Table formatting from the original result was not included. Objective Swallowing Evaluation: Type of Study: MBS-Modified Barium Swallow Study  Patient Details Name: ANJEL PARDO MRN: [id number] Date of Birth: 01-Dec-1945 Today's Date: 12/09/2020 Time: SLP Start Time (ACUTE ONLY): 1300 -SLP Stop Time (ACUTE ONLY): 1325 SLP Time Calculation (min) (ACUTE ONLY): 25 min Past Medical History: Past Medical History: Diagnosis Date  Arthritis   BPH (benign prostatic hyperplasia)   CKD (chronic kidney disease), stage II   Complicated UTI (urinary tract infection) 03/2014  Depression   GERD (gastroesophageal reflux disease)   History of pneumonia   History of stroke   HOH (hard of hearing)   Hypertension   Hyponatremia   Lower GI bleed 2017  a. ? due to polyp.  NSVT (nonsustained ventricular tachycardia) (HCC)   Rheumatic fever   Childhood  Sleep apnea   Does not use CPAP  Type 2 diabetes mellitus ([place])  Past Surgical History: Past Surgical History: Procedure Laterality Date  CARPAL TUNNEL RELEASE Left 2010  CARPAL TUNNEL RELEASE Right 07/13/2017  Procedure: CARPAL TUNNEL RELEASE;  Surgeon: Carole Civil, MD;  Location: [facility];  Service: Orthopedics;  Laterality: Right;  CERVICAL SPINE SURGERY  2010  COLONOSCOPY WITH PROPOFOL N/A 11/30/2015  Procedure: COLONOSCOPY WITH PROPOFOL;  Surgeon: Daneil Dolin, MD;  [facility];  Service: Endoscopy;  Laterality: N/A;  KNEE ARTHROSCOPY  [place] surgery - broken nose    POLYPECTOMY  11/30/2015  Procedure: POLYPECTOMY;  Surgeon: Daneil Dolin, MD;  [facility];  Service: Endoscopy;;  polyp at  ascending colon, rectal polyp  TRANSURETHRAL INCISION OF PROSTATE N/A 01/20/2015  Procedure: TRANSURETHRAL INCISION OF THE PROSTATE (TUIP);  Surgeon: Irine Seal, MD;  Location: [facility];  Service: Urology;  Laterality: N/A; HPI: 75 y.o. male presented to [facility] ED with diarrhea, decreased p.o. intake and nausea. Pt admitted with acute encephalopathy and symptomatic hyponatremia. Abdominal distension 8/27. CT abd/pelvis (8/28) (+) SBO and NGT placed. 8/28 pt went into cardiac arrest, concern for possible STEMI. Transferred to [facility] for further treatment. CT head (8/28) revealed no acute intracranial abnormality. Chest xray (8/30) noted "appearance of airspace disease in the right middle lobe. Small left pleural effusion". Hosptial admission complicated by continued impulsivity and agitation with pt pulling out multiple NGT's despite restraints.  ETT: 8/28-8/30. PMHx significant for CKD, BPH, DMII, Hx of CVA, HTN, and GERD. SLE (08/23/2017) revealed cognitive communication deficits post CVA.  Subjective: Pt seen in radiology for instrumental assessment of swallow function and safety. No family present. Assessment / Plan / Recommendation CHL IP CLINICAL IMPRESSIONS 12/09/2020 Clinical Impression Pt presents with a moderate-severe oral and pharyngeal dysphagia, characterized as follows: Orally, pt demonstrates poor bolus formation and control, with premature spillage over the tongue base across consistencies. Pt was noted to piecemeal several boluses, swallowing small amounts of the bolus at one time. Oral residue was also noted, which then spilled posteriorly into the vallecular sinus. Pharyngeal swallow is characterized by delayed swallow reflex, with trigger occurring at the level of the pyriform sinuses on nectar and honey thick liquids (cup sip), and at the vallecular sinus on honey (tsp) and puree. Epiglottic inversion was reduced, in large part by the presence of the Cortrak feeding tube. This resulted in vallecular  residue across consistencies. This increases aspiration risk, as residue thins with saliva, or is added to by subsequent boluses and can potentially spill over the vallecular sinus and into the open airway. SILENT ASPIRATION was seen on nectar thick liquids via teaspoon and cup sip. Flash penetration was noted during the swallow of honey thick liquids and puree. Pt was noted to be impulsive with cup sips, and has difficulty following commands due to confusion and hearing loss. Pt is at significantly high risk of aspiration of any consistency given. Puree and honey thick liquids may be presented in small bites/sips with 1:1 close supervision. Meds should be crushed in puree or given via Cortrak.  SLP Visit Diagnosis Dysphagia, oropharyngeal phase (R13.12)     Impact on safety and function Severe aspiration risk   CHL IP TREATMENT RECOMMENDATION 12/09/2020 Treatment Recommendations Therapy as outlined in treatment plan below   Prognosis 12/09/2020 Prognosis for Safe Diet Advancement Fair Barriers to Reach Goals Cognitive deficits   CHL IP DIET RECOMMENDATION 12/09/2020 SLP Diet Recommendations Dysphagia 1 (Puree) solids;Honey thick liquids Liquid Administration via Cup;No straw;Spoon Medication Administration Crushed with puree Compensations Minimize environmental distractions;Slow rate;Small sips/bites Postural Changes Seated upright at 90 degrees;Remain semi-upright after after feeds/meals (Comment)   CHL IP OTHER RECOMMENDATIONS 12/09/2020   Oral Care Recommendations Oral care QID Other Recommendations Order thickener from pharmacy  CHL IP FOLLOW UP RECOMMENDATIONS 12/09/2020 Follow up Recommendations TBD   CHL IP FREQUENCY AND DURATION 12/09/2020 Speech Therapy Frequency (ACUTE ONLY) min 2x/week Treatment Duration 2 weeks      CHL IP ORAL PHASE 12/09/2020 Oral Phase Impaired  Oral - Honey Teaspoon Premature spillage;Other (Comment) Oral - Honey Cup Premature spillage;Other (Comment);Piecemeal swallowing Oral - Nectar Teaspoon  Other (Comment);Premature spillage Oral - Nectar Cup Premature spillage;Other (Comment);Piecemeal swallowing Oral - Puree Piecemeal swallowing;Premature spillage   CHL IP PHARYNGEAL PHASE 12/09/2020 Pharyngeal Phase Impaired Pharyngeal- Honey Teaspoon Delayed swallow initiation-vallecula;Pharyngeal residue - valleculae;Penetration/Aspiration during swallow Pharyngeal Material enters airway, remains ABOVE vocal cords then ejected out Pharyngeal- Honey Cup Delayed swallow initiation-pyriform sinuses;Pharyngeal residue - valleculae;Penetration/Aspiration during swallow Pharyngeal Material enters airway, remains ABOVE vocal cords then ejected out Pharyngeal- Nectar Teaspoon Delayed swallow initiation-pyriform sinuses;Pharyngeal residue - valleculae;Penetration/Aspiration during swallow Pharyngeal Material enters airway, passes BELOW cords without attempt by patient to eject out (silent aspiration) Pharyngeal- Nectar Cup Delayed swallow initiation-pyriform sinuses;Pharyngeal residue - valleculae;Penetration/Aspiration during swallow Pharyngeal Material enters airway, passes BELOW cords without attempt by patient to eject out (silent aspiration) Pharyngeal- Puree Delayed swallow initiation-vallecula;Pharyngeal residue - valleculae;Penetration/Aspiration during swallow Pharyngeal Material enters airway, remains ABOVE vocal cords and not ejected out  CHL IP CERVICAL ESOPHAGEAL PHASE 12/09/2020 Cervical Esophageal Phase [facility] Celia B. [name], [facility], CCC-SLP Speech Language Pathologist Office: [phone] Shonna Chock 12/09/2020, 2:00 PM               Microbiology: Recent Results (from the past 240 hour(s))  Culture, blood (Routine X 2) w Reflex to ID Panel     Status: None   Collection Time: 12/31/20  5:19 PM   Specimen: BLOOD RIGHT HAND  Result Value Ref Range Status   Specimen Description BLOOD RIGHT HAND  Final   Special Requests   Final    Blood Culture results may not be optimal due to an excessive volume of  blood received in culture bottles BOTTLES DRAWN AEROBIC AND ANAEROBIC   Culture   Final    NO GROWTH 5 DAYS Performed at [facility], [address] [place], [place] [address]    Report Status 01/05/2021 FINAL  Final  Culture, blood (Routine X 2) w Reflex to ID Panel     Status: None   Collection Time: 12/31/20  5:19 PM   Specimen: BLOOD LEFT ARM  Result Value Ref Range Status   Specimen Description BLOOD LEFT ARM  Final   Special Requests   Final    Blood Culture results may not be optimal due to an excessive volume of blood received in culture bottles BOTTLES DRAWN AEROBIC AND ANAEROBIC   Culture   Final    NO GROWTH 5 DAYS Performed at [facility], [address] [place], [place] [address]    Report Status 01/05/2021 FINAL  Final  Resp Panel by RT-PCR (Flu A&B, Covid) Nasopharyngeal Swab     Status: None   Collection Time: 01/07/21  1:44 PM   Specimen: Nasopharyngeal Swab; Nasopharyngeal(NP) swabs in vial transport medium  Result Value Ref Range Status   SARS Coronavirus 2 by RT PCR NEGATIVE NEGATIVE Final    Comment: (NOTE) SARS-CoV-2 target nucleic acids are NOT DETECTED.  The SARS-CoV-2 RNA is generally detectable in upper respiratory specimens during the acute phase of infection. The lowest concentration of SARS-CoV-2 viral copies this assay can detect is 138 copies/mL. A negative result does not preclude SARS-Cov-2 infection and should not be used as the sole basis for  treatment or other patient management decisions. A negative result may occur with  improper specimen collection/handling, submission of specimen other than nasopharyngeal swab, presence of viral mutation(s) within the areas targeted by this assay, and inadequate number of viral copies(<138 copies/mL). A negative result must be combined with clinical observations, patient history, and epidemiological information. The expected result is Negative.  Fact Sheet for Patients:   EntrepreneurPulse.com.au  Fact Sheet for Healthcare Providers:  IncredibleEmployment.be  This test is no t yet approved or cleared by the [place] FDA and  has been authorized for detection and/or diagnosis of SARS-CoV-2 by FDA under an Emergency Use Authorization (EUA). This EUA will remain  in effect (meaning this test can be used) for the duration of the COVID-19 declaration under Section 564(b)(1) of the Act, 21 U.S.C.section 360bbb-3(b)(1), unless the authorization is terminated  or revoked sooner.       Influenza A by PCR NEGATIVE NEGATIVE Final   Influenza B by PCR NEGATIVE NEGATIVE Final    Comment: (NOTE) The Xpert Xpress SARS-CoV-2/FLU/RSV plus assay is intended as an aid in the diagnosis of influenza from Nasopharyngeal swab specimens and should not be used as a sole basis for treatment. Nasal washings and aspirates are unacceptable for Xpert Xpress SARS-CoV-2/FLU/RSV testing.  Fact Sheet for Patients: EntrepreneurPulse.com.au  Fact Sheet for Healthcare Providers: IncredibleEmployment.be  This test is not yet approved or cleared by the [place] FDA and has been authorized for detection and/or diagnosis of SARS-CoV-2 by FDA under an Emergency Use Authorization (EUA). This EUA will remain in effect (meaning this test can be used) for the duration of the COVID-19 declaration under Section 564(b)(1) of the Act, 21 U.S.C. section 360bbb-3(b)(1), unless the authorization is terminated or revoked.  Performed at [facility], [address], [place], [place] [address]      Labs: Basic Metabolic Panel: Recent Labs  Lab 01/02/21 0527 01/03/21 0403 01/04/21 0525 01/07/21 0531  NA 134* 134* 134* 134*  K 3.7 3.7 3.9 3.9  CL 102 102 101 101  CO2 25 25 25 27   GLUCOSE 205* 178* 179* 155*  BUN 9 9 12 11   CREATININE 1.07 1.09 1.23 1.10  CALCIUM 8.4* 8.6* 8.8* 8.6*  MG 2.0 2.0 1.9 1.9   PHOS 3.0 3.0 2.8 2.8   Liver Function Tests: Recent Labs  Lab 01/02/21 0527 01/03/21 0403 01/04/21 0525  AST 17 18 18   ALT 24 23 20   ALKPHOS 82 82 83  BILITOT 0.9 0.7 0.9  PROT 6.9 6.8 7.0  ALBUMIN 3.1* 3.1* 3.2*   No results for input(s): LIPASE, AMYLASE in the last 168 hours. No results for input(s): AMMONIA in the last 168 hours. CBC: Recent Labs  Lab 01/02/21 0527 01/03/21 0403 01/04/21 0525 01/07/21 0531  WBC 8.0 8.0 9.0 6.9  NEUTROABS 4.4 4.4 4.7  --   HGB 11.3* 11.9* 11.9* 11.6*  HCT 34.8* 37.3* 37.1* 35.6*  MCV 91.6 91.4 92.3 92.7  PLT 301 312 365 336   Cardiac Enzymes: No results for input(s): CKTOTAL, CKMB, CKMBINDEX, TROPONINI in the last 168 hours. BNP: Invalid input(s): POCBNP CBG: Recent Labs  Lab 01/07/21 0718 01/07/21 1128 01/07/21 1557 01/07/21 2115 01/08/21 0754  GLUCAP 157* 178* 140* 237* 152*    Time coordinating discharge:  36 minutes  Signed:  Orson Eva, DO [facility] Pager: [phone] 01/08/2021, 10:35 AM

## 2021-01-08 NOTE — Evaluation (Signed)
Clinical/Bedside Swallow Evaluation Patient Details  Name: Albert Garrison MRN: [id number] Date of Birth: [birth year]  Today's Date: 01/08/2021 Time: SLP Start Time (ACUTE ONLY): 8546 SLP Stop Time (ACUTE ONLY): 2703 SLP Time Calculation (min) (ACUTE ONLY): 17 min  Past Medical History:  Past Medical History:  Diagnosis Date   Arthritis    BPH (benign prostatic hyperplasia)    CKD (chronic kidney disease), stage II    Complicated UTI (urinary tract infection) 03/2014   Depression    GERD (gastroesophageal reflux disease)    History of pneumonia    History of stroke    HOH (hard of hearing)    Hypertension    Hyponatremia    Lower GI bleed 2017   a. ? due to polyp.   NSVT (nonsustained ventricular tachycardia) (HCC)    Rheumatic fever    Childhood   Sleep apnea    Does not use CPAP   Type 2 diabetes mellitus ([place])    Past Surgical History:  Past Surgical History:  Procedure Laterality Date   CARPAL TUNNEL RELEASE Left 2010   CARPAL TUNNEL RELEASE Right 07/13/2017   Procedure: CARPAL TUNNEL RELEASE;  Surgeon: Carole Civil, MD;  Location: [facility];  Service: Orthopedics;  Laterality: Right;   CERVICAL SPINE SURGERY  2010   COLONOSCOPY WITH PROPOFOL N/A 11/30/2015   Procedure: COLONOSCOPY WITH PROPOFOL;  Surgeon: Daneil Dolin, MD;  Location: [facility];  Service: Endoscopy;  Laterality: N/A;   KNEE ARTHROSCOPY  [place] surgery - broken nose     POLYPECTOMY  11/30/2015   Procedure: POLYPECTOMY;  Surgeon: Daneil Dolin, MD;  Location: [facility];  Service: Endoscopy;;  polyp at ascending colon, rectal polyp   TRANSURETHRAL INCISION OF PROSTATE N/A 01/20/2015   Procedure: TRANSURETHRAL INCISION OF THE PROSTATE (TUIP);  Surgeon: Irine Seal, MD;  Location: [facility];  Service: Urology;  Laterality: N/A;   HPI:  75 y.o. male with a history of type 2 diabetes, stage III chronic kidney disease, BPH, history of stroke, GERD, history of Cardiac  Arrest.  The patient was recently admitted on 11/27/2020 and was discharged approximately 3 days ago from the hospital.  During that hospitalization, the patient went into cardiac arrest and had acute hypoxemic respiratory failure, he was intubated and ROSC was obtained after about 1 minute.  The patient now presents with hematuria.  While he was in the emergency department, he spiked a fever of 103.  Laboratory data showed white count of 20,000 and a UA suggestive of UTI.  The patient is not a reliable historian as there is thought to be a component of dementia.Plan to discharge to memory care assisted living facility.  He completed 10-day course of IV antibiotics Rocephin for Group A strep bacteremia and UTI on 01/03/21.  -now remains pleasantly confused--suspect he is near baseline; BSE generated to assess safest diet prior to D/C to memory care facility; currently on Dysphagia 2/thin liquids.    Assessment / Plan / Recommendation  Clinical Impression  Pt seen for clinical swallowing evaluation with cognitive-based dysphagia noted characterized by oral holding and suspected delay in the initation of the swallow d/t decreased sustained attention; consequently affecting all PO intake.  Pt attempted to speak during consumption of POs prior to swallowing; but with min redirection, this halted and pt swallowed various boluses without incident.  No s/s of aspiration noted during BSE, but pt is at minimal risk for aspiration  d/t cognitive impairment.  Recommend continuing current diet of Dysphagia 2/thin liquids utilizing general swallowing precautions with intermittent supervision during meals decreasing distractions as much as possible (ie: turning off television, limited conversation, etc.). ST will f/u briefly for diet tolerance while in acute setting. SLP Visit Diagnosis: Dysphagia, unspecified (R13.10)    Aspiration Risk  Mild aspiration risk    Diet Recommendation   Dysphagia 2/thin liquids  Medication  Administration: Whole meds with liquid (as tolerated or whole/puree)    Other  Recommendations Oral Care Recommendations: Oral care BID    Recommendations for follow up therapy are one component of a multi-disciplinary discharge planning process, led by the attending physician.  Recommendations may be updated based on patient status, additional functional criteria and insurance authorization.  Follow up Recommendations 24 hour supervision/assistance;Other (comment)      Frequency and Duration min 1 x/week  1 week       Prognosis Prognosis for Safe Diet Advancement: Good Barriers to Reach Goals: Cognitive deficits      Swallow Study   General Date of Onset: 12/25/20 HPI: 75 y.o. male with a history of type 2 diabetes, stage III chronic kidney disease, BPH, history of stroke, GERD, history of Cardiac Arrest.  The patient was recently admitted on 11/27/2020 and was discharged approximately 3 days ago from the hospital.  During that hospitalization, the patient went into cardiac arrest and had acute hypoxemic respiratory failure, he was intubated and ROSC was obtained after about 1 minute.  The patient now presents with hematuria.  While he was in the emergency department, he spiked a fever of 103.  Laboratory data showed white count of 20,000 and a UA suggestive of UTI.  The patient is not a reliable historian as there is thought to be a component of dementia.Plan to discharge to memory care assisted living facility.  He completed 10-day course of IV antibiotics Rocephin for Group A strep bacteremia and UTI on 01/03/21.  -now remains pleasantly confused--suspect he is near baseline; BSE generated to assess safest diet prior to D/C to memory care facility; currently on Dysphagia 2/thin liquids. Type of Study: Bedside Swallow Evaluation Previous Swallow Assessment: n/a Diet Prior to this Study: Dysphagia 2 (chopped);Thin liquids Temperature Spikes Noted: No Respiratory Status: Room air History  of Recent Intubation: No Behavior/Cognition: Alert;Confused Oral Cavity Assessment: Within Functional Limits Oral Care Completed by SLP: No (Pt eating lunch tray when SLP entered room) Oral Cavity - Dentition: Adequate natural dentition Vision: Functional for self-feeding Self-Feeding Abilities: Needs assist Patient Positioning: Upright in chair Baseline Vocal Quality: Low vocal intensity Volitional Cough: Strong Volitional Swallow: Able to elicit    Oral/Motor/Sensory Function Overall Oral Motor/Sensory Function: Within functional limits (grossly as pt was unable to complete all required tasks for OME) Facial Symmetry: Within Functional Limits   Ice Chips Ice chips: Not tested   Thin Liquid Thin Liquid: Within functional limits Presentation: Self Fed;Cup Other Comments: Pt consumed successive swallows independently    Nectar Thick Nectar Thick Liquid: Not tested   Honey Thick Honey Thick Liquid: Not tested   Puree Puree: Impaired Presentation: Spoon Oral Phase Functional Implications: Oral holding Pharyngeal Phase Impairments: Suspected delayed Swallow   Solid     Solid: Impaired Presentation: Self Fed Oral Phase Functional Implications: Oral holding Pharyngeal Phase Impairments: Suspected delayed Swallow      Elvina Sidle, M.S., CCC-SLP 01/08/2021,3:00 PM

## 2021-01-08 NOTE — Care Management Important Message (Signed)
Important Message  Patient Details  Name: Albert Garrison MRN: [id number] Date of Birth: [birth year]   Medicare Important Message Given:  Yes     [name] 01/08/2021, 1:11 PM

## 2021-01-09 DIAGNOSIS — N390 Urinary tract infection, site not specified: Secondary | ICD-10-CM | POA: Diagnosis not present

## 2021-01-09 LAB — GLUCOSE, CAPILLARY
Glucose-Capillary: 103 mg/dL — ABNORMAL HIGH (ref 70–99)
Glucose-Capillary: 146 mg/dL — ABNORMAL HIGH (ref 70–99)
Glucose-Capillary: 151 mg/dL — ABNORMAL HIGH (ref 70–99)
Glucose-Capillary: 191 mg/dL — ABNORMAL HIGH (ref 70–99)
Glucose-Capillary: 273 mg/dL — ABNORMAL HIGH (ref 70–99)

## 2021-01-09 NOTE — Discharge Summary (Signed)
Physician Discharge Summary  Albert Garrison UXN:[id number] DOB: [birth year] DOA: 12/25/2020  PCP: Caprice Renshaw, MD  Admit date: 12/25/2020 Discharge date: 01/09/2021  Admitted From: Home Disposition:  ALF  Recommendations for Outpatient Follow-up:  Follow up with PCP in 1-2 weeks Please obtain BMP/CBC in one week Please have speech therapy continue to follow  and evaluate patient for safest, lease restrictive diet  Home Health:HHPT Discharge Condition: Stable CODE STATUS:FULL Diet recommendation: Heart Healthy / Carb Modified --dysphagia 2 with thin liquid   Brief/Interim Summary: 01/05/2021: He was seen and examined this morning.  He was alert and pleasantly confused.  He had no new complaints.   01/07/21:  remains pleasantly confused.  No complaints.  Remains medically stable. Doing better with PT.  CXR neg--personally reviewed.  COVID-19 negative  01/09/21--remains pleasantly confused.  Occasionally impulsive.  Remains medically stable.  No complains  Discharge Diagnoses:  Acute metabolic encephalopathy, persistent Plan to discharge to memory care assisted living facility. He completed 10-day course of IV antibiotics Rocephin for Group A strep bacteremia and UTI on 01/03/21. -now remains pleasantly confused--suspect he is near baseline -He has been A&O x 2 and remains calm and re-directable -B12, folate, TSH normal   BPH with chronic urinary retention, Foley catheter in place. Tried voiding trial 9/27, but he continued to have issues with urine retention -Foley catheter replaced on 9/27 and he will need outpatient urology follow-up Continue Flomax Continue to monitor urine output. -referral sent to [facility]   Mild euvolemic hyponatremia Serum sodium 134 Monitor for now Encourage oral intake   Treated Group A strep bacteremia Completed Rocephin on 01/03/2021. -No need to evaluate further for endocarditis -Discussed case with Dr. Gale Journey with ID -Repeat blood  cultures ordered on 9/29 with no growth final.   Type 2 diabetes with hyperglycemia, uncontrolled Last hemoglobin A1c 8.6 on 11/27/20 -DYS 2 diet Continue insulin sliding scale. -restart metformin after d/c   History of stroke -Continue aspirin and Lipitor -DYS 2 diet   History of NSTEMI -Continue aspirin, Lipitor, beta-blocker  -no chest pain presently   HTN -continue amlodipine, metoprolol   Discharge Instructions  Discharge Instructions     Ambulatory referral to Urology   Complete by: As directed    Diet - low sodium heart healthy   Complete by: As directed    Increase activity slowly   Complete by: As directed       Allergies as of 01/09/2021   No Known Allergies      Medication List     STOP taking these medications    insulin glargine 100 UNIT/ML injection Commonly known as: LANTUS       TAKE these medications    amLODipine 10 MG tablet Commonly known as: NORVASC Take 1 tablet (10 mg total) by mouth daily.   aspirin 81 MG chewable tablet Chew 1 tablet (81 mg total) by mouth daily.   atorvastatin 40 MG tablet Commonly known as: LIPITOR Take 40 mg by mouth daily.   haloperidol 0.5 MG tablet Commonly known as: HALDOL Take 1 tablet (0.5 mg total) by mouth 2 (two) times daily.   insulin aspart 100 UNIT/ML injection Commonly known as: novoLOG Inject 0-15 Units into the skin 3 (three) times daily with meals. CBG < 70: Implement Hypoglycemia Standing Orders and refer to Hypoglycemia Standing Orders sidebar report  CBG 70 - 120: 0 units  CBG 121 - 150: 2 units  CBG 151 - 200: 3 units  CBG 201 - 250: 5  units  CBG 251 - 300: 8 units  CBG 301 - 350: 11 units  CBG 351 - 400: 15 units   metFORMIN 500 MG tablet Commonly known as: GLUCOPHAGE Take 500 mg by mouth daily with breakfast.   metoprolol tartrate 25 MG tablet Commonly known as: LOPRESSOR Take 1 tablet (25 mg total) by mouth 2 (two) times daily.   multivitamin with minerals Tabs  tablet Take 1 tablet by mouth daily.   tamsulosin 0.4 MG Caps capsule Commonly known as: FLOMAX Take 1 capsule (0.4 mg total) by mouth daily after supper.       ASK your doctor about these medications    acetaminophen 500 MG tablet Commonly known as: TYLENOL Take 2 tablets (1,000 mg total) by mouth every 8 (eight) hours as needed for up to 10 days. Ask about: Should I take this medication?        Contact information for follow-up providers     Caprice Renshaw, MD. Schedule an appointment as soon as possible for a visit .   Specialty: Internal Medicine Contact information: [place] [address] [address]. Schedule an appointment as soon as possible for a visit .   Specialty: Urology Contact information: [address] [place] [phone]             Contact information for after-discharge care     Destination     [facility] .   Service: Assisted Living Contact information: [place] [phone]                    No Known Allergies  Consultations: none   Procedures/Studies: DG CHEST PORT 1 VIEW  Result Date: 01/07/2021 CLINICAL DATA:  TB screening. EXAM: PORTABLE CHEST 1 VIEW COMPARISON:  Chest x-ray 12/25/2020. FINDINGS: The heart size and mediastinal contours are within normal limits. Both lungs are clear. Cervical spinal fusion plate is present. The visualized skeletal structures are otherwise unremarkable. IMPRESSION: No active disease. Electronically Signed   By: Ronney Asters M.D.   On: 01/07/2021 15:46   DG Chest Port 1 View  Result Date: 12/25/2020 CLINICAL DATA:  Hematuria and fever. EXAM: PORTABLE CHEST 1 VIEW COMPARISON:  December 01, 2020 FINDINGS: Mild atelectasis and/or infiltrate is seen within the bilateral lung bases. There is no evidence of a pleural effusion or pneumothorax.  The heart size and mediastinal contours are within normal limits. Radiopaque fusion plates and screws are seen overlying the cervical spine. IMPRESSION: Mild bibasilar atelectasis and/or infiltrate. Electronically Signed   By: Virgina Norfolk M.D.   On: 12/25/2020 16:56   DG Swallowing Func-Speech Pathology  Result Date: 12/17/2020 Table formatting from the original result was not included. Objective Swallowing Evaluation: Type of Study: MBS-Modified Barium Swallow Study  Patient Details Name: BREVIN MCFADDEN MRN: [id number] Date of Birth: 24-Dec-1945 Today's Date: 12/17/2020 Time: SLP Start Time (ACUTE ONLY): 6553 -SLP Stop Time (ACUTE ONLY): 7482 SLP Time Calculation (min) (ACUTE ONLY): 15 min Past Medical History: Past Medical History: Diagnosis Date  Arthritis   BPH (benign prostatic hyperplasia)   CKD (chronic kidney disease), stage II   Complicated UTI (urinary tract infection) 03/2014  Depression   GERD (gastroesophageal reflux disease)   History of pneumonia   History of stroke   HOH (hard of hearing)   Hypertension   Hyponatremia   Lower GI  bleed 2017  a. ? due to polyp.  NSVT (nonsustained ventricular tachycardia) (HCC)   Rheumatic fever   Childhood  Sleep apnea   Does not use CPAP  Type 2 diabetes mellitus ([place])  Past Surgical History: Past Surgical History: Procedure Laterality Date  CARPAL TUNNEL RELEASE Left 2010  CARPAL TUNNEL RELEASE Right 07/13/2017  Procedure: CARPAL TUNNEL RELEASE;  Surgeon: Carole Civil, MD;  Location: [facility];  Service: Orthopedics;  Laterality: Right;  CERVICAL SPINE SURGERY  2010  COLONOSCOPY WITH PROPOFOL N/A 11/30/2015  Procedure: COLONOSCOPY WITH PROPOFOL;  Surgeon: Daneil Dolin, MD;  [facility]: [facility];  Service: Endoscopy;  Laterality: N/A;  KNEE ARTHROSCOPY  [place] surgery - broken nose    POLYPECTOMY  11/30/2015  Procedure: POLYPECTOMY;  Surgeon: Daneil Dolin, MD;  Location: [facility];  Service: Endoscopy;;  polyp at  ascending colon, rectal polyp  TRANSURETHRAL INCISION OF PROSTATE N/A 01/20/2015  Procedure: TRANSURETHRAL INCISION OF THE PROSTATE (TUIP);  Surgeon: Irine Seal, MD;  Location: [facility];  Service: Urology;  Laterality: N/A; HPI: 75 y.o. male presented to [facility] ED with diarrhea, decreased p.o. intake and nausea. Pt admitted with acute encephalopathy and symptomatic hyponatremia. Abdominal distension 8/27. CT abd/pelvis (8/28) (+) SBO and NGT placed. 8/28 pt went into cardiac arrest, concern for possible STEMI. Transferred to [facility] for further treatment. CT head (8/28) revealed no acute intracranial abnormality. Chest xray (8/30) noted "appearance of airspace disease in the right middle lobe. Small left pleural effusion". Hosptial admission complicated by continued impulsivity and agitation with pt pulling out multiple NGT's despite restraints.  ETT: 8/28-8/30. PMHx significant for CKD, BPH, DMII, Hx of CVA, HTN, and GERD. SLE (08/23/2017) revealed cognitive communication deficits post CVA.  Subjective: Pt seen in radiology for instrumental assessment of swallow function and safety. No family present. Assessment / Plan / Recommendation CHL IP CLINICAL IMPRESSIONS 12/17/2020 Clinical Impression Pt presents with ongoing dysphagia with several factors that could be persistently impacting effective swallowing. Pt has a prior ACDF with hardware present from C3-C6. His brother denies any knowledge of baseline impairment, but suspect pt may have had a mild change in swallowing since that surgery that he compensated for and can no longer discuss. He is noted to have decreased epiglottic deflection, with epiglottic movement restricted by posterior pharyngeal wall, keeping vestibule slightly open at the height of laryngeal elevation. Pt has instances of bolus penetratating the vestibule before hyoid excursion with thin and nectar thick liquids squeezing between the posterior process of the glottis during hyoid excursion. Pts sensation  is absent and voice is slightly hoarse. Question the integrity of glottic competence given this finding. Pt was unable to sustain a chin tuck or achieve any other strategies gievn mentation and decreased neck ROM. Recommend pt continue a finely chopped diet given rapid intake of solids with partial mastication and spillage of solids to lower pharynx during ongoing masticaiton. Pt should also continue honey thick liquids. Discussed findings with brother. Recommend a repeat MBS a few weeks after NG tube removal for potential upgrade as presence of NG could be another complicating factor. If there is no improvement in airway protection with liquids after another 14 days approximately, would recommend referral to ENT to examine larynx directly. SLP Visit Diagnosis Dysphagia, oropharyngeal phase (R13.12) Attention and concentration deficit following -- Frontal lobe and executive function deficit following -- Impact on safety and function Moderate aspiration risk   CHL IP TREATMENT RECOMMENDATION 12/17/2020  Treatment Recommendations Therapy as outlined in treatment plan below   Prognosis 12/17/2020 Prognosis for Safe Diet Advancement Good Barriers to Reach Goals -- Barriers/Prognosis Comment -- CHL IP DIET RECOMMENDATION 12/17/2020 SLP Diet Recommendations Dysphagia 2 (Fine chop) solids;Honey thick liquids Liquid Administration via Cup;Straw;Spoon Medication Administration Crushed with puree Compensations Minimize environmental distractions;Slow rate;Small sips/bites Postural Changes Remain semi-upright after after feeds/meals (Comment)   CHL IP OTHER RECOMMENDATIONS 12/17/2020 Recommended Consults -- Oral Care Recommendations Oral care BID Other Recommendations --   CHL IP FOLLOW UP RECOMMENDATIONS 12/17/2020 Follow up Recommendations Skilled Nursing facility   [facility] IP FREQUENCY AND DURATION 12/17/2020 Speech Therapy Frequency (ACUTE ONLY) min 2x/week Treatment Duration 2 weeks      CHL IP ORAL PHASE 12/17/2020 Oral Phase  Impaired Oral - Pudding Teaspoon -- Oral - Pudding Cup -- Oral - Honey Teaspoon NT Oral - Honey Cup WFL Oral - Nectar Teaspoon WFL Oral - Nectar Cup WFL Oral - Nectar Straw WFL Oral - Thin Teaspoon -- Oral - Thin Cup WFL Oral - Thin Straw WFL Oral - Puree WFL Oral - Mech Soft -- Oral - Regular Premature spillage;Decreased bolus cohesion;Delayed oral transit;Holding of bolus Oral - Multi-Consistency -- Oral - Pill -- Oral Phase - Comment --  CHL IP PHARYNGEAL PHASE 12/17/2020 Pharyngeal Phase Impaired Pharyngeal- Pudding Teaspoon -- Pharyngeal -- Pharyngeal- Pudding Cup -- Pharyngeal -- Pharyngeal- Honey Teaspoon NT Pharyngeal -- Pharyngeal- Honey Cup WFL Pharyngeal -- Pharyngeal- Nectar Teaspoon NT Pharyngeal -- Pharyngeal- Nectar Cup Penetration/Aspiration before swallow;Penetration/Aspiration during swallow;Reduced epiglottic inversion;Reduced airway/laryngeal closure;Trace aspiration Pharyngeal Material enters airway, CONTACTS cords and not ejected out;Material enters airway, passes BELOW cords without attempt by patient to eject out (silent aspiration);Material enters airway, passes BELOW cords then ejected out Pharyngeal- Nectar Straw Penetration/Aspiration before swallow;Penetration/Aspiration during swallow;Reduced epiglottic inversion;Reduced airway/laryngeal closure;Trace aspiration Pharyngeal Material enters airway, CONTACTS cords and not ejected out;Material enters airway, passes BELOW cords without attempt by patient to eject out (silent aspiration) Pharyngeal- Thin Teaspoon -- Pharyngeal -- Pharyngeal- Thin Cup Reduced epiglottic inversion;Penetration/Aspiration before swallow;Penetration/Aspiration during swallow;Trace aspiration;Moderate aspiration;Reduced airway/laryngeal closure Pharyngeal Material enters airway, passes BELOW cords without attempt by patient to eject out (silent aspiration);Material enters airway, CONTACTS cords and then ejected out;Material enters airway, CONTACTS cords and not  ejected out Pharyngeal- Thin Straw Reduced epiglottic inversion;Penetration/Aspiration before swallow;Penetration/Aspiration during swallow;Trace aspiration;Moderate aspiration;Reduced airway/laryngeal closure Pharyngeal Material enters airway, passes BELOW cords without attempt by patient to eject out (silent aspiration);Material enters airway, CONTACTS cords and then ejected out;Material enters airway, CONTACTS cords and not ejected out Pharyngeal- Puree WFL Pharyngeal -- Pharyngeal- Mechanical Soft -- Pharyngeal -- Pharyngeal- Regular Pharyngeal residue - valleculae;Pharyngeal residue - pyriform Pharyngeal -- Pharyngeal- Multi-consistency -- Pharyngeal -- Pharyngeal- Pill -- Pharyngeal -- Pharyngeal Comment --  CHL IP CERVICAL ESOPHAGEAL PHASE 12/09/2020 Cervical Esophageal Phase WFL Pudding Teaspoon -- Pudding Cup -- Honey Teaspoon -- Honey Cup -- Nectar Teaspoon -- Nectar Cup -- Nectar Straw -- Thin Teaspoon -- Thin Cup -- Thin Straw -- Puree -- Mechanical Soft -- Regular -- Multi-consistency -- Pill -- Cervical Esophageal Comment -- Lynann Beaver 12/17/2020, 11:52 AM                   Discharge Exam: Vitals:   01/09/21 0511 01/09/21 1322  BP: 115/69 136/64  Pulse: (!) 56 (!) 50  Resp: 16 19  Temp: 97.6 F (36.4 C) (!) 97.5 F (36.4 C)  SpO2: 96%    Vitals:   01/08/21 1333 01/09/21 0036 01/09/21 0511 01/09/21 1322  BP: 130/66 108/69 115/69 136/64  Pulse: (!) 51 (!) 53 (!) 56 (!) 50  Resp: 19 18 16 19   Temp: 97.7 F (36.5 C) (!) 97.4 F (36.3 C) 97.6 F (36.4 C) (!) 97.5 F (36.4 C)  TempSrc: Oral Oral Oral Oral  SpO2: 100% 98% 96%   Weight:      Height:        General: Pt is alert, awake, not in acute distress Cardiovascular: RRR, S1/S2 +, no rubs, no gallops Respiratory: CTA bilaterally, no wheezing, no rhonchi Abdominal: Soft, NT, ND, bowel sounds + Extremities: no edema, no cyanosis   The results of significant diagnostics from this hospitalization (including  imaging, microbiology, ancillary and laboratory) are listed below for reference.    Significant Diagnostic Studies: DG CHEST PORT 1 VIEW  Result Date: 01/07/2021 CLINICAL DATA:  TB screening. EXAM: PORTABLE CHEST 1 VIEW COMPARISON:  Chest x-ray 12/25/2020. FINDINGS: The heart size and mediastinal contours are within normal limits. Both lungs are clear. Cervical spinal fusion plate is present. The visualized skeletal structures are otherwise unremarkable. IMPRESSION: No active disease. Electronically Signed   By: Ronney Asters M.D.   On: 01/07/2021 15:46   DG Chest Port 1 View  Result Date: 12/25/2020 CLINICAL DATA:  Hematuria and fever. EXAM: PORTABLE CHEST 1 VIEW COMPARISON:  December 01, 2020 FINDINGS: Mild atelectasis and/or infiltrate is seen within the bilateral lung bases. There is no evidence of a pleural effusion or pneumothorax. The heart size and mediastinal contours are within normal limits. Radiopaque fusion plates and screws are seen overlying the cervical spine. IMPRESSION: Mild bibasilar atelectasis and/or infiltrate. Electronically Signed   By: Virgina Norfolk M.D.   On: 12/25/2020 16:56   DG Swallowing Func-Speech Pathology  Result Date: 12/17/2020 Table formatting from the original result was not included. Objective Swallowing Evaluation: Type of Study: MBS-Modified Barium Swallow Study  Patient Details Name: REMER COUSE MRN: [id number] Date of Birth: [birth year] Today's Date: 12/17/2020 Time: SLP Start Time (ACUTE ONLY): 7846 -SLP Stop Time (ACUTE ONLY): 9629 SLP Time Calculation (min) (ACUTE ONLY): 15 min Past Medical History: Past Medical History: Diagnosis Date  Arthritis   BPH (benign prostatic hyperplasia)   CKD (chronic kidney disease), stage II   Complicated UTI (urinary tract infection) 03/2014  Depression   GERD (gastroesophageal reflux disease)   History of pneumonia   History of stroke   HOH (hard of hearing)   Hypertension   Hyponatremia   Lower GI bleed 2017  a. ? due to  polyp.  NSVT (nonsustained ventricular tachycardia) (HCC)   Rheumatic fever   Childhood  Sleep apnea   Does not use CPAP  Type 2 diabetes mellitus ([place])  Past Surgical History: Past Surgical History: Procedure Laterality Date  CARPAL TUNNEL RELEASE Left 2010  CARPAL TUNNEL RELEASE Right 07/13/2017  Procedure: CARPAL TUNNEL RELEASE;  Surgeon: Carole Civil, MD;  Location: [facility];  Service: Orthopedics;  Laterality: Right;  CERVICAL SPINE SURGERY  2010  COLONOSCOPY WITH PROPOFOL N/A 11/30/2015  Procedure: COLONOSCOPY WITH PROPOFOL;  Surgeon: Daneil Dolin, MD;  [facility];  Service: Endoscopy;  Laterality: N/A;  KNEE ARTHROSCOPY  [place] surgery - broken nose    POLYPECTOMY  11/30/2015  Procedure: POLYPECTOMY;  Surgeon: Daneil Dolin, MD;  [facility];  Service: Endoscopy;;  polyp at ascending colon, rectal polyp  TRANSURETHRAL INCISION OF PROSTATE N/A 01/20/2015  Procedure: TRANSURETHRAL INCISION OF THE PROSTATE (TUIP);  Surgeon: Irine Seal, MD;  [facility];  Service: Urology;  Laterality: N/A; HPI: 75 y.o. male presented to [facility] ED with diarrhea, decreased p.o. intake and nausea. Pt admitted with acute encephalopathy and symptomatic hyponatremia. Abdominal distension 8/27. CT abd/pelvis (8/28) (+) SBO and NGT placed. 8/28 pt went into cardiac arrest, concern for possible STEMI. Transferred to [facility] for further treatment. CT head (8/28) revealed no acute intracranial abnormality. Chest xray (8/30) noted "appearance of airspace disease in the right middle lobe. Small left pleural effusion". Hosptial admission complicated by continued impulsivity and agitation with pt pulling out multiple NGT's despite restraints.  ETT: 8/28-8/30. PMHx significant for CKD, BPH, DMII, Hx of CVA, HTN, and GERD. SLE (08/23/2017) revealed cognitive communication deficits post CVA.  Subjective: Pt seen in radiology for instrumental assessment of swallow function and safety. No  family present. Assessment / Plan / Recommendation CHL IP CLINICAL IMPRESSIONS 12/17/2020 Clinical Impression Pt presents with ongoing dysphagia with several factors that could be persistently impacting effective swallowing. Pt has a prior ACDF with hardware present from C3-C6. His brother denies any knowledge of baseline impairment, but suspect pt may have had a mild change in swallowing since that surgery that he compensated for and can no longer discuss. He is noted to have decreased epiglottic deflection, with epiglottic movement restricted by posterior pharyngeal wall, keeping vestibule slightly open at the height of laryngeal elevation. Pt has instances of bolus penetratating the vestibule before hyoid excursion with thin and nectar thick liquids squeezing between the posterior process of the glottis during hyoid excursion. Pts sensation is absent and voice is slightly hoarse. Question the integrity of glottic competence given this finding. Pt was unable to sustain a chin tuck or achieve any other strategies gievn mentation and decreased neck ROM. Recommend pt continue a finely chopped diet given rapid intake of solids with partial mastication and spillage of solids to lower pharynx during ongoing masticaiton. Pt should also continue honey thick liquids. Discussed findings with brother. Recommend a repeat MBS a few weeks after NG tube removal for potential upgrade as presence of NG could be another complicating factor. If there is no improvement in airway protection with liquids after another 14 days approximately, would recommend referral to ENT to examine larynx directly. SLP Visit Diagnosis Dysphagia, oropharyngeal phase (R13.12) Attention and concentration deficit following -- Frontal lobe and executive function deficit following -- Impact on safety and function Moderate aspiration risk   CHL IP TREATMENT RECOMMENDATION 12/17/2020 Treatment Recommendations Therapy as outlined in treatment plan below    Prognosis 12/17/2020 Prognosis for Safe Diet Advancement Good Barriers to Reach Goals -- Barriers/Prognosis Comment -- CHL IP DIET RECOMMENDATION 12/17/2020 SLP Diet Recommendations Dysphagia 2 (Fine chop) solids;Honey thick liquids Liquid Administration via Cup;Straw;Spoon Medication Administration Crushed with puree Compensations Minimize environmental distractions;Slow rate;Small sips/bites Postural Changes Remain semi-upright after after feeds/meals (Comment)   CHL IP OTHER RECOMMENDATIONS 12/17/2020 Recommended Consults -- Oral Care Recommendations Oral care BID Other Recommendations --   CHL IP FOLLOW UP RECOMMENDATIONS 12/17/2020 Follow up Recommendations Skilled Nursing facility   [facility] IP FREQUENCY AND DURATION 12/17/2020 Speech Therapy Frequency (ACUTE ONLY) min 2x/week Treatment Duration 2 weeks      CHL IP ORAL PHASE 12/17/2020 Oral Phase Impaired Oral - Pudding Teaspoon -- Oral - Pudding Cup -- Oral - Honey Teaspoon NT Oral - Honey Cup WFL Oral - Nectar Teaspoon WFL Oral - Nectar Cup WFL Oral - Nectar Straw WFL Oral - Thin Teaspoon -- Oral - Thin Cup [facility]  Oral - Thin Straw WFL Oral - Puree WFL Oral - Mech Soft -- Oral - Regular Premature spillage;Decreased bolus cohesion;Delayed oral transit;Holding of bolus Oral - Multi-Consistency -- Oral - Pill -- Oral Phase - Comment --  CHL IP PHARYNGEAL PHASE 12/17/2020 Pharyngeal Phase Impaired Pharyngeal- Pudding Teaspoon -- Pharyngeal -- Pharyngeal- Pudding Cup -- Pharyngeal -- Pharyngeal- Honey Teaspoon NT Pharyngeal -- Pharyngeal- Honey Cup WFL Pharyngeal -- Pharyngeal- Nectar Teaspoon NT Pharyngeal -- Pharyngeal- Nectar Cup Penetration/Aspiration before swallow;Penetration/Aspiration during swallow;Reduced epiglottic inversion;Reduced airway/laryngeal closure;Trace aspiration Pharyngeal Material enters airway, CONTACTS cords and not ejected out;Material enters airway, passes BELOW cords without attempt by patient to eject out (silent aspiration);Material enters  airway, passes BELOW cords then ejected out Pharyngeal- Nectar Straw Penetration/Aspiration before swallow;Penetration/Aspiration during swallow;Reduced epiglottic inversion;Reduced airway/laryngeal closure;Trace aspiration Pharyngeal Material enters airway, CONTACTS cords and not ejected out;Material enters airway, passes BELOW cords without attempt by patient to eject out (silent aspiration) Pharyngeal- Thin Teaspoon -- Pharyngeal -- Pharyngeal- Thin Cup Reduced epiglottic inversion;Penetration/Aspiration before swallow;Penetration/Aspiration during swallow;Trace aspiration;Moderate aspiration;Reduced airway/laryngeal closure Pharyngeal Material enters airway, passes BELOW cords without attempt by patient to eject out (silent aspiration);Material enters airway, CONTACTS cords and then ejected out;Material enters airway, CONTACTS cords and not ejected out Pharyngeal- Thin Straw Reduced epiglottic inversion;Penetration/Aspiration before swallow;Penetration/Aspiration during swallow;Trace aspiration;Moderate aspiration;Reduced airway/laryngeal closure Pharyngeal Material enters airway, passes BELOW cords without attempt by patient to eject out (silent aspiration);Material enters airway, CONTACTS cords and then ejected out;Material enters airway, CONTACTS cords and not ejected out Pharyngeal- Puree WFL Pharyngeal -- Pharyngeal- Mechanical Soft -- Pharyngeal -- Pharyngeal- Regular Pharyngeal residue - valleculae;Pharyngeal residue - pyriform Pharyngeal -- Pharyngeal- Multi-consistency -- Pharyngeal -- Pharyngeal- Pill -- Pharyngeal -- Pharyngeal Comment --  CHL IP CERVICAL ESOPHAGEAL PHASE 12/09/2020 Cervical Esophageal Phase WFL Pudding Teaspoon -- Pudding Cup -- Honey Teaspoon -- Honey Cup -- Nectar Teaspoon -- Nectar Cup -- Nectar Straw -- Thin Teaspoon -- Thin Cup -- Thin Straw -- Puree -- Mechanical Soft -- Regular -- Multi-consistency -- Pill -- Cervical Esophageal Comment -- DeBlois, Katherene Ponto 12/17/2020,  11:52 AM               Microbiology: Recent Results (from the past 240 hour(s))  Culture, blood (Routine X 2) w Reflex to ID Panel     Status: None   Collection Time: 12/31/20  5:19 PM   Specimen: BLOOD RIGHT HAND  Result Value Ref Range Status   Specimen Description BLOOD RIGHT HAND  Final   Special Requests   Final    Blood Culture results may not be optimal due to an excessive volume of blood received in culture bottles BOTTLES DRAWN AEROBIC AND ANAEROBIC   Culture   Final    NO GROWTH 5 DAYS Performed at [facility], [address], [place], [place] [address]    Report Status 01/05/2021 FINAL  Final  Culture, blood (Routine X 2) w Reflex to ID Panel     Status: None   Collection Time: 12/31/20  5:19 PM   Specimen: BLOOD LEFT ARM  Result Value Ref Range Status   Specimen Description BLOOD LEFT ARM  Final   Special Requests   Final    Blood Culture results may not be optimal due to an excessive volume of blood received in culture bottles BOTTLES DRAWN AEROBIC AND ANAEROBIC   Culture   Final    NO GROWTH 5 DAYS Performed at [facility], [address], [place], [place] [address]    Report Status 01/05/2021 FINAL  Final  Resp Panel by RT-PCR (Flu A&B, Covid) Nasopharyngeal Swab     Status: None   Collection Time: 01/07/21  1:44 PM   Specimen: Nasopharyngeal Swab; Nasopharyngeal(NP) swabs in vial transport medium  Result Value Ref Range Status   SARS Coronavirus 2 by RT PCR NEGATIVE NEGATIVE Final    Comment: (NOTE) SARS-CoV-2 target nucleic acids are NOT DETECTED.  The SARS-CoV-2 RNA is generally detectable in upper respiratory specimens during the acute phase of infection. The lowest concentration of SARS-CoV-2 viral copies this assay can detect is 138 copies/mL. A negative result does not preclude SARS-Cov-2 infection and should not be used as the sole basis for treatment or other patient management decisions. A negative result may occur with  improper specimen  collection/handling, submission of specimen other than nasopharyngeal swab, presence of viral mutation(s) within the areas targeted by this assay, and inadequate number of viral copies(<138 copies/mL). A negative result must be combined with clinical observations, patient history, and epidemiological information. The expected result is Negative.  Fact Sheet for Patients:  EntrepreneurPulse.com.au  Fact Sheet for Healthcare Providers:  IncredibleEmployment.be  This test is no t yet approved or cleared by the [place] FDA and  has been authorized for detection and/or diagnosis of SARS-CoV-2 by FDA under an Emergency Use Authorization (EUA). This EUA will remain  in effect (meaning this test can be used) for the duration of the COVID-19 declaration under Section 564(b)(1) of the Act, 21 U.S.C.section 360bbb-3(b)(1), unless the authorization is terminated  or revoked sooner.       Influenza A by PCR NEGATIVE NEGATIVE Final   Influenza B by PCR NEGATIVE NEGATIVE Final    Comment: (NOTE) The Xpert Xpress SARS-CoV-2/FLU/RSV plus assay is intended as an aid in the diagnosis of influenza from Nasopharyngeal swab specimens and should not be used as a sole basis for treatment. Nasal washings and aspirates are unacceptable for Xpert Xpress SARS-CoV-2/FLU/RSV testing.  Fact Sheet for Patients: EntrepreneurPulse.com.au  Fact Sheet for Healthcare Providers: IncredibleEmployment.be  This test is not yet approved or cleared by the [place] FDA and has been authorized for detection and/or diagnosis of SARS-CoV-2 by FDA under an Emergency Use Authorization (EUA). This EUA will remain in effect (meaning this test can be used) for the duration of the COVID-19 declaration under Section 564(b)(1) of the Act, 21 U.S.C. section 360bbb-3(b)(1), unless the authorization is terminated or revoked.  Performed at [facility], [address] [place], [place] [address]      Labs: Basic Metabolic Panel: Recent Labs  Lab 01/03/21 0403 01/04/21 0525 01/07/21 0531  NA 134* 134* 134*  K 3.7 3.9 3.9  CL 102 101 101  CO2 25 25 27   GLUCOSE 178* 179* 155*  BUN 9 12 11   CREATININE 1.09 1.23 1.10  CALCIUM 8.6* 8.8* 8.6*  MG 2.0 1.9 1.9  PHOS 3.0 2.8 2.8   Liver Function Tests: Recent Labs  Lab 01/03/21 0403 01/04/21 0525  AST 18 18  ALT 23 20  ALKPHOS 82 83  BILITOT 0.7 0.9  PROT 6.8 7.0  ALBUMIN 3.1* 3.2*   No results for input(s): LIPASE, AMYLASE in the last 168 hours. No results for input(s): AMMONIA in the last 168 hours. CBC: Recent Labs  Lab 01/03/21 0403 01/04/21 0525 01/07/21 0531  WBC 8.0 9.0 6.9  NEUTROABS 4.4 4.7  --   HGB 11.9* 11.9* 11.6*  HCT 37.3* 37.1* 35.6*  MCV 91.4 92.3 92.7  PLT 312 365 336   Cardiac  Enzymes: No results for input(s): CKTOTAL, CKMB, CKMBINDEX, TROPONINI in the last 168 hours. BNP: Invalid input(s): POCBNP CBG: Recent Labs  Lab 01/08/21 0754 01/08/21 1127 01/08/21 1612 01/09/21 0031 01/09/21 1113  GLUCAP 152* 181* 148* 146* 191*    Time coordinating discharge:  36 minutes  Signed:  Orson Eva, DO [facility] Pager: [phone] 01/09/2021, 1:57 PM

## 2021-01-10 DIAGNOSIS — I10 Essential (primary) hypertension: Secondary | ICD-10-CM | POA: Diagnosis not present

## 2021-01-10 DIAGNOSIS — N390 Urinary tract infection, site not specified: Secondary | ICD-10-CM | POA: Diagnosis not present

## 2021-01-10 LAB — GLUCOSE, CAPILLARY: Glucose-Capillary: 190 mg/dL — ABNORMAL HIGH (ref 70–99)

## 2021-01-10 MED ORDER — METOPROLOL TARTRATE 25 MG PO TABS
12.5000 mg | ORAL_TABLET | Freq: Two times a day (BID) | ORAL | Status: DC
  Administered 2021-01-10 – 2021-01-11 (×2): 12.5 mg via ORAL
  Filled 2021-01-10 (×2): qty 1

## 2021-01-10 NOTE — Discharge Summary (Addendum)
Physician Discharge Summary  KYJUAN GAUSE JSH:[id number] DOB: 1945/11/06 DOA: 12/25/2020  PCP: Caprice Renshaw, MD  Admit date: 12/25/2020 Discharge date: 01/11/2021  Admitted From: Home Disposition:  ALF  Recommendations for Outpatient Follow-up:  Follow up with PCP in 1-2 weeks Please obtain BMP/CBC in one week Please have speech therapy continue to follow  and evaluate patient for safest, lease restrictive diet  Home Health: HHPT  Discharge Condition: Stable CODE STATUS: FULL Diet recommendation: Heart Healthy / Carb Modified --dysphagia 2 with thin liquid   Brief/Interim Summary: KELL FERRIS is a 75 y.o. male with a history of type 2 diabetes, BPH, history of stroke, GERD, history of Cardiac Arrest.  The patient was recently admitted on 11/27/2020 and was discharged approximately 3 days prior to presentation from the hospital.  During that hospitalization, the patient went into cardiac arrest and had acute hypoxemic respiratory failure, he was intubated and ROSC was obtained after about 1 minute.  The patient now presents with hematuria.  He has been admitted with some acute metabolic encephalopathy in the setting of catheter associated UTI.  He continues to have some confusion, but he does not qualify for SNF and therefore is unsafe for discharge to home.  APS evaluation pending as well as guardianship.  He was treated for group A strep bacteremia and completed a 10-day course of Rocephin.  No noted agitation for the past 48 hours, labs and vital signs have been stable.  CSW is assisting with DC planning.  Plan for discharge to memory care assisted living facility.   01/05/2021: He was seen and examined this morning.  He was alert and pleasantly confused.  He had no new complaints.   01/07/21:  remains pleasantly confused.  No complaints.  Remains medically stable. Doing better with PT.  CXR neg--personally reviewed.  COVID-19 negative  01/10/21--remains pleaantly confused.  No  complaints.  Eating well.  Remains medically stable for D/C  Discharge Diagnoses:  Acute metabolic encephalopathy, persistent Plan to discharge to memory care assisted living facility. He completed 10-day course of IV antibiotics Rocephin for Group A strep bacteremia and UTI on 01/03/21. -now remains pleasantly confused--suspect he is near baseline -He has been A&O x 2 and remains calm and re-directable -B12, folate, TSH normal -mental status has remain stable/improved for several days prior to d/c   BPH with chronic urinary retention, Foley catheter in place. Tried voiding trial 9/27, but he continued to have issues with urine retention -Foley catheter replaced on 9/27 and he will need outpatient urology follow-up Continue Flomax Continue to monitor urine output. -referral sent to [facility]   Mild euvolemic hyponatremia Serum sodium 134 Monitor for now Encourage oral intake   Treated Group A strep bacteremia Completed Rocephin on 01/03/2021. -No need to evaluate further for endocarditis -Discussed case with Dr. Gale Journey with ID -Repeat blood cultures ordered on 9/29 with no growth final.   Type 2 diabetes with hyperglycemia, uncontrolled Last hemoglobin A1c 8.6 on 11/27/20 -DYS 2 diet Continue insulin sliding scale. -restart metformin after d/c   History of stroke -Continue aspirin and Lipitor -DYS 2 diet with thin liquids   History of NSTEMI -Continue aspirin, Lipitor, beta-blocker  -no chest pain presently   HTN -continue amlodipine, metoprolol     Discharge Instructions  Discharge Instructions     Ambulatory referral to Urology   Complete by: As directed    Diet - low sodium heart healthy   Complete by: As directed    Increase activity  slowly   Complete by: As directed       Allergies as of 01/11/2021   No Known Allergies      Medication List     STOP taking these medications    insulin glargine 100 UNIT/ML injection Commonly known as:  LANTUS       TAKE these medications    amLODipine 10 MG tablet Commonly known as: NORVASC Take 1 tablet (10 mg total) by mouth daily.   aspirin 81 MG chewable tablet Chew 1 tablet (81 mg total) by mouth daily.   atorvastatin 40 MG tablet Commonly known as: LIPITOR Take 1 tablet (40 mg total) by mouth daily.   haloperidol 0.5 MG tablet Commonly known as: HALDOL Take 1 tablet (0.5 mg total) by mouth 2 (two) times daily.   insulin aspart 100 UNIT/ML injection Commonly known as: novoLOG Inject 0-15 Units into the skin 3 (three) times daily with meals. CBG < 70: Implement Hypoglycemia Standing Orders and refer to Hypoglycemia Standing Orders sidebar report  CBG 70 - 120: 0 units  CBG 121 - 150: 2 units  CBG 151 - 200: 3 units  CBG 201 - 250: 5 units  CBG 251 - 300: 8 units  CBG 301 - 350: 11 units  CBG 351 - 400: 15 units   magnesium oxide 400 (240 Mg) MG tablet Commonly known as: MAG-OX Take 1 tablet (400 mg total) by mouth daily. Start taking on: January 12, 2021   metFORMIN 500 MG tablet Commonly known as: GLUCOPHAGE Take 1 tablet (500 mg total) by mouth daily with breakfast.   metoprolol tartrate 25 MG tablet Commonly known as: LOPRESSOR Take 0.5 tablets (12.5 mg total) by mouth 2 (two) times daily. What changed: how much to take   multivitamin with minerals Tabs tablet Take 1 tablet by mouth daily.   tamsulosin 0.4 MG Caps capsule Commonly known as: FLOMAX Take 1 capsule (0.4 mg total) by mouth daily after supper.       ASK your doctor about these medications    acetaminophen 500 MG tablet Commonly known as: TYLENOL Take 2 tablets (1,000 mg total) by mouth every 8 (eight) hours as needed for up to 10 days. Ask about: Should I take this medication?        Contact information for follow-up providers     Caprice Renshaw, MD. Schedule an appointment as soon as possible for a visit .   Specialty: Internal Medicine Contact information: [place] [address] [place]. Schedule an appointment as soon as possible for a visit .   Specialty: Urology Contact information: [address] [place] [phone]             Contact information for after-discharge care     Destination     [facility] .   Service: Assisted Living Contact information: [place] [phone]                    No Known Allergies  Consultations: none   Procedures/Studies: DG CHEST PORT 1 VIEW  Result Date: 01/07/2021 CLINICAL DATA:  TB screening. EXAM: PORTABLE CHEST 1 VIEW COMPARISON:  Chest x-ray 12/25/2020. FINDINGS: The heart size and mediastinal contours are within normal limits. Both lungs are clear. Cervical spinal fusion plate is present. The visualized skeletal structures are otherwise unremarkable. IMPRESSION: No active  disease. Electronically Signed   By: Ronney Asters M.D.   On: 01/07/2021 15:46   DG Chest Port 1 View  Result Date: 12/25/2020 CLINICAL DATA:  Hematuria and fever. EXAM: PORTABLE CHEST 1 VIEW COMPARISON:  December 01, 2020 FINDINGS: Mild atelectasis and/or infiltrate is seen within the bilateral lung bases. There is no evidence of a pleural effusion or pneumothorax. The heart size and mediastinal contours are within normal limits. Radiopaque fusion plates and screws are seen overlying the cervical spine. IMPRESSION: Mild bibasilar atelectasis and/or infiltrate. Electronically Signed   By: Virgina Norfolk M.D.   On: 12/25/2020 16:56   DG Swallowing Func-Speech Pathology  Result Date: 12/17/2020 Table formatting from the original result was not included. Objective Swallowing Evaluation: Type of Study: MBS-Modified Barium Swallow Study  Patient Details Name: TRAYON KRANTZ MRN: [id number] Date of Birth: March 17, 1946 Today's Date: 12/17/2020 Time: SLP Start Time  (ACUTE ONLY): 0175 -SLP Stop Time (ACUTE ONLY): 1025 SLP Time Calculation (min) (ACUTE ONLY): 15 min Past Medical History: Past Medical History: Diagnosis Date  Arthritis   BPH (benign prostatic hyperplasia)   CKD (chronic kidney disease), stage II   Complicated UTI (urinary tract infection) 03/2014  Depression   GERD (gastroesophageal reflux disease)   History of pneumonia   History of stroke   HOH (hard of hearing)   Hypertension   Hyponatremia   Lower GI bleed 2017  a. ? due to polyp.  NSVT (nonsustained ventricular tachycardia) (HCC)   Rheumatic fever   Childhood  Sleep apnea   Does not use CPAP  Type 2 diabetes mellitus ([place])  Past Surgical History: Past Surgical History: Procedure Laterality Date  CARPAL TUNNEL RELEASE Left 2010  CARPAL TUNNEL RELEASE Right 07/13/2017  Procedure: CARPAL TUNNEL RELEASE;  Surgeon: Carole Civil, MD;  Location: [facility];  Service: Orthopedics;  Laterality: Right;  CERVICAL SPINE SURGERY  2010  COLONOSCOPY WITH PROPOFOL N/A 11/30/2015  Procedure: COLONOSCOPY WITH PROPOFOL;  Surgeon: Daneil Dolin, MD;  Location: [facility];  Service: Endoscopy;  Laterality: N/A;  KNEE ARTHROSCOPY  [place] surgery - broken nose    POLYPECTOMY  11/30/2015  Procedure: POLYPECTOMY;  Surgeon: Daneil Dolin, MD;  Location: [facility];  Service: Endoscopy;;  polyp at ascending colon, rectal polyp  TRANSURETHRAL INCISION OF PROSTATE N/A 01/20/2015  Procedure: TRANSURETHRAL INCISION OF THE PROSTATE (TUIP);  Surgeon: Irine Seal, MD;  Location: [facility];  Service: Urology;  Laterality: N/A; HPI: 75 y.o. male presented to [facility] ED with diarrhea, decreased p.o. intake and nausea. Pt admitted with acute encephalopathy and symptomatic hyponatremia. Abdominal distension 8/27. CT abd/pelvis (8/28) (+) SBO and NGT placed. 8/28 pt went into cardiac arrest, concern for possible STEMI. Transferred to [facility] for further treatment. CT head (8/28) revealed no acute intracranial abnormality.  Chest xray (8/30) noted "appearance of airspace disease in the right middle lobe. Small left pleural effusion". Hosptial admission complicated by continued impulsivity and agitation with pt pulling out multiple NGT's despite restraints.  ETT: 8/28-8/30. PMHx significant for CKD, BPH, DMII, Hx of CVA, HTN, and GERD. SLE (08/23/2017) revealed cognitive communication deficits post CVA.  Subjective: Pt seen in radiology for instrumental assessment of swallow function and safety. No family present. Assessment / Plan / Recommendation CHL IP CLINICAL IMPRESSIONS 12/17/2020 Clinical Impression Pt presents with ongoing dysphagia with several factors that could be persistently impacting effective swallowing. Pt has a prior ACDF with hardware present from C3-C6. His brother denies any  knowledge of baseline impairment, but suspect pt may have had a mild change in swallowing since that surgery that he compensated for and can no longer discuss. He is noted to have decreased epiglottic deflection, with epiglottic movement restricted by posterior pharyngeal wall, keeping vestibule slightly open at the height of laryngeal elevation. Pt has instances of bolus penetratating the vestibule before hyoid excursion with thin and nectar thick liquids squeezing between the posterior process of the glottis during hyoid excursion. Pts sensation is absent and voice is slightly hoarse. Question the integrity of glottic competence given this finding. Pt was unable to sustain a chin tuck or achieve any other strategies gievn mentation and decreased neck ROM. Recommend pt continue a finely chopped diet given rapid intake of solids with partial mastication and spillage of solids to lower pharynx during ongoing masticaiton. Pt should also continue honey thick liquids. Discussed findings with brother. Recommend a repeat MBS a few weeks after NG tube removal for potential upgrade as presence of NG could be another complicating factor. If there is no  improvement in airway protection with liquids after another 14 days approximately, would recommend referral to ENT to examine larynx directly. SLP Visit Diagnosis Dysphagia, oropharyngeal phase (R13.12) Attention and concentration deficit following -- Frontal lobe and executive function deficit following -- Impact on safety and function Moderate aspiration risk   CHL IP TREATMENT RECOMMENDATION 12/17/2020 Treatment Recommendations Therapy as outlined in treatment plan below   Prognosis 12/17/2020 Prognosis for Safe Diet Advancement Good Barriers to Reach Goals -- Barriers/Prognosis Comment -- CHL IP DIET RECOMMENDATION 12/17/2020 SLP Diet Recommendations Dysphagia 2 (Fine chop) solids;Honey thick liquids Liquid Administration via Cup;Straw;Spoon Medication Administration Crushed with puree Compensations Minimize environmental distractions;Slow rate;Small sips/bites Postural Changes Remain semi-upright after after feeds/meals (Comment)   CHL IP OTHER RECOMMENDATIONS 12/17/2020 Recommended Consults -- Oral Care Recommendations Oral care BID Other Recommendations --   CHL IP FOLLOW UP RECOMMENDATIONS 12/17/2020 Follow up Recommendations Skilled Nursing facility   [facility] IP FREQUENCY AND DURATION 12/17/2020 Speech Therapy Frequency (ACUTE ONLY) min 2x/week Treatment Duration 2 weeks      CHL IP ORAL PHASE 12/17/2020 Oral Phase Impaired Oral - Pudding Teaspoon -- Oral - Pudding Cup -- Oral - Honey Teaspoon NT Oral - Honey Cup WFL Oral - Nectar Teaspoon WFL Oral - Nectar Cup WFL Oral - Nectar Straw WFL Oral - Thin Teaspoon -- Oral - Thin Cup WFL Oral - Thin Straw WFL Oral - Puree WFL Oral - Mech Soft -- Oral - Regular Premature spillage;Decreased bolus cohesion;Delayed oral transit;Holding of bolus Oral - Multi-Consistency -- Oral - Pill -- Oral Phase - Comment --  CHL IP PHARYNGEAL PHASE 12/17/2020 Pharyngeal Phase Impaired Pharyngeal- Pudding Teaspoon -- Pharyngeal -- Pharyngeal- Pudding Cup -- Pharyngeal -- Pharyngeal- Honey  Teaspoon NT Pharyngeal -- Pharyngeal- Honey Cup WFL Pharyngeal -- Pharyngeal- Nectar Teaspoon NT Pharyngeal -- Pharyngeal- Nectar Cup Penetration/Aspiration before swallow;Penetration/Aspiration during swallow;Reduced epiglottic inversion;Reduced airway/laryngeal closure;Trace aspiration Pharyngeal Material enters airway, CONTACTS cords and not ejected out;Material enters airway, passes BELOW cords without attempt by patient to eject out (silent aspiration);Material enters airway, passes BELOW cords then ejected out Pharyngeal- Nectar Straw Penetration/Aspiration before swallow;Penetration/Aspiration during swallow;Reduced epiglottic inversion;Reduced airway/laryngeal closure;Trace aspiration Pharyngeal Material enters airway, CONTACTS cords and not ejected out;Material enters airway, passes BELOW cords without attempt by patient to eject out (silent aspiration) Pharyngeal- Thin Teaspoon -- Pharyngeal -- Pharyngeal- Thin Cup Reduced epiglottic inversion;Penetration/Aspiration before swallow;Penetration/Aspiration during swallow;Trace aspiration;Moderate aspiration;Reduced airway/laryngeal closure Pharyngeal Material enters airway, passes BELOW cords  without attempt by patient to eject out (silent aspiration);Material enters airway, CONTACTS cords and then ejected out;Material enters airway, CONTACTS cords and not ejected out Pharyngeal- Thin Straw Reduced epiglottic inversion;Penetration/Aspiration before swallow;Penetration/Aspiration during swallow;Trace aspiration;Moderate aspiration;Reduced airway/laryngeal closure Pharyngeal Material enters airway, passes BELOW cords without attempt by patient to eject out (silent aspiration);Material enters airway, CONTACTS cords and then ejected out;Material enters airway, CONTACTS cords and not ejected out Pharyngeal- Puree WFL Pharyngeal -- Pharyngeal- Mechanical Soft -- Pharyngeal -- Pharyngeal- Regular Pharyngeal residue - valleculae;Pharyngeal residue - pyriform  Pharyngeal -- Pharyngeal- Multi-consistency -- Pharyngeal -- Pharyngeal- Pill -- Pharyngeal -- Pharyngeal Comment --  CHL IP CERVICAL ESOPHAGEAL PHASE 12/09/2020 Cervical Esophageal Phase WFL Pudding Teaspoon -- Pudding Cup -- Honey Teaspoon -- Honey Cup -- Nectar Teaspoon -- Nectar Cup -- Nectar Straw -- Thin Teaspoon -- Thin Cup -- Thin Straw -- Puree -- Mechanical Soft -- Regular -- Multi-consistency -- Pill -- Cervical Esophageal Comment -- Lynann Beaver 12/17/2020, 11:52 AM                   Discharge Exam: Vitals:   01/11/21 0815 01/11/21 0849  BP: 112/72 118/60  Pulse: 64 65  Resp: 18   Temp: 97.8 F (36.6 C)   SpO2: 99%    Vitals:   01/10/21 2007 01/11/21 0300 01/11/21 0815 01/11/21 0849  BP: 110/76 135/64 112/72 118/60  Pulse: (!) 57 63 64 65  Resp: 19 18 18    Temp: 97.9 F (36.6 C) 97.7 F (36.5 C) 97.8 F (36.6 C)   TempSrc: Oral Axillary Oral   SpO2: 96% 96% 99%   Weight:      Height:        General: Pt is alert, awake, not in acute distress Cardiovascular: RRR, S1/S2 +, no rubs, no gallops Respiratory: CTA bilaterally, no wheezing, no rhonchi Abdominal: Soft, NT, ND, bowel sounds + Extremities: no edema, no cyanosis   The results of significant diagnostics from this hospitalization (including imaging, microbiology, ancillary and laboratory) are listed below for reference.    Significant Diagnostic Studies: DG CHEST PORT 1 VIEW  Result Date: 01/07/2021 CLINICAL DATA:  TB screening. EXAM: PORTABLE CHEST 1 VIEW COMPARISON:  Chest x-ray 12/25/2020. FINDINGS: The heart size and mediastinal contours are within normal limits. Both lungs are clear. Cervical spinal fusion plate is present. The visualized skeletal structures are otherwise unremarkable. IMPRESSION: No active disease. Electronically Signed   By: Ronney Asters M.D.   On: 01/07/2021 15:46   DG Chest Port 1 View  Result Date: 12/25/2020 CLINICAL DATA:  Hematuria and fever. EXAM: PORTABLE CHEST 1  VIEW COMPARISON:  December 01, 2020 FINDINGS: Mild atelectasis and/or infiltrate is seen within the bilateral lung bases. There is no evidence of a pleural effusion or pneumothorax. The heart size and mediastinal contours are within normal limits. Radiopaque fusion plates and screws are seen overlying the cervical spine. IMPRESSION: Mild bibasilar atelectasis and/or infiltrate. Electronically Signed   By: Virgina Norfolk M.D.   On: 12/25/2020 16:56   DG Swallowing Func-Speech Pathology  Result Date: 12/17/2020 Table formatting from the original result was not included. Objective Swallowing Evaluation: Type of Study: MBS-Modified Barium Swallow Study  Patient Details Name: GEMAYEL MASCIO MRN: [id number] Date of Birth: [birth year] Today's Date: 12/17/2020 Time: SLP Start Time (ACUTE ONLY): 0947 -SLP Stop Time (ACUTE ONLY): 0953 SLP Time Calculation (min) (ACUTE ONLY): 15 min Past Medical History: Past Medical History: Diagnosis Date  Arthritis   BPH (benign prostatic hyperplasia)  CKD (chronic kidney disease), stage II   Complicated UTI (urinary tract infection) 03/2014  Depression   GERD (gastroesophageal reflux disease)   History of pneumonia   History of stroke   HOH (hard of hearing)   Hypertension   Hyponatremia   Lower GI bleed 2017  a. ? due to polyp.  NSVT (nonsustained ventricular tachycardia) (HCC)   Rheumatic fever   Childhood  Sleep apnea   Does not use CPAP  Type 2 diabetes mellitus ([place])  Past Surgical History: Past Surgical History: Procedure Laterality Date  CARPAL TUNNEL RELEASE Left 2010  CARPAL TUNNEL RELEASE Right 07/13/2017  Procedure: CARPAL TUNNEL RELEASE;  Surgeon: Carole Civil, MD;  Location: [facility];  Service: Orthopedics;  Laterality: Right;  CERVICAL SPINE SURGERY  2010  COLONOSCOPY WITH PROPOFOL N/A 11/30/2015  Procedure: COLONOSCOPY WITH PROPOFOL;  Surgeon: Daneil Dolin, MD;  [facility];  Service: Endoscopy;  Laterality: N/A;  KNEE ARTHROSCOPY  [place] surgery - broken nose    POLYPECTOMY  11/30/2015  Procedure: POLYPECTOMY;  Surgeon: Daneil Dolin, MD;  [facility];  Service: Endoscopy;;  polyp at ascending colon, rectal polyp  TRANSURETHRAL INCISION OF PROSTATE N/A 01/20/2015  Procedure: TRANSURETHRAL INCISION OF THE PROSTATE (TUIP);  Surgeon: Irine Seal, MD;  Location: [facility];  Service: Urology;  Laterality: N/A; HPI: 75 y.o. male presented to [facility] ED with diarrhea, decreased p.o. intake and nausea. Pt admitted with acute encephalopathy and symptomatic hyponatremia. Abdominal distension 8/27. CT abd/pelvis (8/28) (+) SBO and NGT placed. 8/28 pt went into cardiac arrest, concern for possible STEMI. Transferred to [facility] for further treatment. CT head (8/28) revealed no acute intracranial abnormality. Chest xray (8/30) noted "appearance of airspace disease in the right middle lobe. Small left pleural effusion". Hosptial admission complicated by continued impulsivity and agitation with pt pulling out multiple NGT's despite restraints.  ETT: 8/28-8/30. PMHx significant for CKD, BPH, DMII, Hx of CVA, HTN, and GERD. SLE (08/23/2017) revealed cognitive communication deficits post CVA.  Subjective: Pt seen in radiology for instrumental assessment of swallow function and safety. No family present. Assessment / Plan / Recommendation CHL IP CLINICAL IMPRESSIONS 12/17/2020 Clinical Impression Pt presents with ongoing dysphagia with several factors that could be persistently impacting effective swallowing. Pt has a prior ACDF with hardware present from C3-C6. His brother denies any knowledge of baseline impairment, but suspect pt may have had a mild change in swallowing since that surgery that he compensated for and can no longer discuss. He is noted to have decreased epiglottic deflection, with epiglottic movement restricted by posterior pharyngeal wall, keeping vestibule slightly open at the height of laryngeal elevation. Pt has instances of  bolus penetratating the vestibule before hyoid excursion with thin and nectar thick liquids squeezing between the posterior process of the glottis during hyoid excursion. Pts sensation is absent and voice is slightly hoarse. Question the integrity of glottic competence given this finding. Pt was unable to sustain a chin tuck or achieve any other strategies gievn mentation and decreased neck ROM. Recommend pt continue a finely chopped diet given rapid intake of solids with partial mastication and spillage of solids to lower pharynx during ongoing masticaiton. Pt should also continue honey thick liquids. Discussed findings with brother. Recommend a repeat MBS a few weeks after NG tube removal for potential upgrade as presence of NG could be another complicating factor. If there is no improvement in airway protection with liquids after another  14 days approximately, would recommend referral to ENT to examine larynx directly. SLP Visit Diagnosis Dysphagia, oropharyngeal phase (R13.12) Attention and concentration deficit following -- Frontal lobe and executive function deficit following -- Impact on safety and function Moderate aspiration risk   CHL IP TREATMENT RECOMMENDATION 12/17/2020 Treatment Recommendations Therapy as outlined in treatment plan below   Prognosis 12/17/2020 Prognosis for Safe Diet Advancement Good Barriers to Reach Goals -- Barriers/Prognosis Comment -- CHL IP DIET RECOMMENDATION 12/17/2020 SLP Diet Recommendations Dysphagia 2 (Fine chop) solids;Honey thick liquids Liquid Administration via Cup;Straw;Spoon Medication Administration Crushed with puree Compensations Minimize environmental distractions;Slow rate;Small sips/bites Postural Changes Remain semi-upright after after feeds/meals (Comment)   CHL IP OTHER RECOMMENDATIONS 12/17/2020 Recommended Consults -- Oral Care Recommendations Oral care BID Other Recommendations --   CHL IP FOLLOW UP RECOMMENDATIONS 12/17/2020 Follow up Recommendations Skilled  Nursing facility   [facility] IP FREQUENCY AND DURATION 12/17/2020 Speech Therapy Frequency (ACUTE ONLY) min 2x/week Treatment Duration 2 weeks      CHL IP ORAL PHASE 12/17/2020 Oral Phase Impaired Oral - Pudding Teaspoon -- Oral - Pudding Cup -- Oral - Honey Teaspoon NT Oral - Honey Cup WFL Oral - Nectar Teaspoon WFL Oral - Nectar Cup WFL Oral - Nectar Straw WFL Oral - Thin Teaspoon -- Oral - Thin Cup WFL Oral - Thin Straw WFL Oral - Puree WFL Oral - Mech Soft -- Oral - Regular Premature spillage;Decreased bolus cohesion;Delayed oral transit;Holding of bolus Oral - Multi-Consistency -- Oral - Pill -- Oral Phase - Comment --  CHL IP PHARYNGEAL PHASE 12/17/2020 Pharyngeal Phase Impaired Pharyngeal- Pudding Teaspoon -- Pharyngeal -- Pharyngeal- Pudding Cup -- Pharyngeal -- Pharyngeal- Honey Teaspoon NT Pharyngeal -- Pharyngeal- Honey Cup WFL Pharyngeal -- Pharyngeal- Nectar Teaspoon NT Pharyngeal -- Pharyngeal- Nectar Cup Penetration/Aspiration before swallow;Penetration/Aspiration during swallow;Reduced epiglottic inversion;Reduced airway/laryngeal closure;Trace aspiration Pharyngeal Material enters airway, CONTACTS cords and not ejected out;Material enters airway, passes BELOW cords without attempt by patient to eject out (silent aspiration);Material enters airway, passes BELOW cords then ejected out Pharyngeal- Nectar Straw Penetration/Aspiration before swallow;Penetration/Aspiration during swallow;Reduced epiglottic inversion;Reduced airway/laryngeal closure;Trace aspiration Pharyngeal Material enters airway, CONTACTS cords and not ejected out;Material enters airway, passes BELOW cords without attempt by patient to eject out (silent aspiration) Pharyngeal- Thin Teaspoon -- Pharyngeal -- Pharyngeal- Thin Cup Reduced epiglottic inversion;Penetration/Aspiration before swallow;Penetration/Aspiration during swallow;Trace aspiration;Moderate aspiration;Reduced airway/laryngeal closure Pharyngeal Material enters airway, passes  BELOW cords without attempt by patient to eject out (silent aspiration);Material enters airway, CONTACTS cords and then ejected out;Material enters airway, CONTACTS cords and not ejected out Pharyngeal- Thin Straw Reduced epiglottic inversion;Penetration/Aspiration before swallow;Penetration/Aspiration during swallow;Trace aspiration;Moderate aspiration;Reduced airway/laryngeal closure Pharyngeal Material enters airway, passes BELOW cords without attempt by patient to eject out (silent aspiration);Material enters airway, CONTACTS cords and then ejected out;Material enters airway, CONTACTS cords and not ejected out Pharyngeal- Puree WFL Pharyngeal -- Pharyngeal- Mechanical Soft -- Pharyngeal -- Pharyngeal- Regular Pharyngeal residue - valleculae;Pharyngeal residue - pyriform Pharyngeal -- Pharyngeal- Multi-consistency -- Pharyngeal -- Pharyngeal- Pill -- Pharyngeal -- Pharyngeal Comment --  CHL IP CERVICAL ESOPHAGEAL PHASE 12/09/2020 Cervical Esophageal Phase WFL Pudding Teaspoon -- Pudding Cup -- Honey Teaspoon -- Honey Cup -- Nectar Teaspoon -- Nectar Cup -- Nectar Straw -- Thin Teaspoon -- Thin Cup -- Thin Straw -- Puree -- Mechanical Soft -- Regular -- Multi-consistency -- Pill -- Cervical Esophageal Comment -- Lynann Beaver 12/17/2020, 11:52 AM               Microbiology: Recent Results (from the past 240  hour(s))  Resp Panel by RT-PCR (Flu A&B, Covid) Nasopharyngeal Swab     Status: None   Collection Time: 01/07/21  1:44 PM   Specimen: Nasopharyngeal Swab; Nasopharyngeal(NP) swabs in vial transport medium  Result Value Ref Range Status   SARS Coronavirus 2 by RT PCR NEGATIVE NEGATIVE Final    Comment: (NOTE) SARS-CoV-2 target nucleic acids are NOT DETECTED.  The SARS-CoV-2 RNA is generally detectable in upper respiratory specimens during the acute phase of infection. The lowest concentration of SARS-CoV-2 viral copies this assay can detect is 138 copies/mL. A negative result does not  preclude SARS-Cov-2 infection and should not be used as the sole basis for treatment or other patient management decisions. A negative result may occur with  improper specimen collection/handling, submission of specimen other than nasopharyngeal swab, presence of viral mutation(s) within the areas targeted by this assay, and inadequate number of viral copies(<138 copies/mL). A negative result must be combined with clinical observations, patient history, and epidemiological information. The expected result is Negative.  Fact Sheet for Patients:  EntrepreneurPulse.com.au  Fact Sheet for Healthcare Providers:  IncredibleEmployment.be  This test is no t yet approved or cleared by the [place] FDA and  has been authorized for detection and/or diagnosis of SARS-CoV-2 by FDA under an Emergency Use Authorization (EUA). This EUA will remain  in effect (meaning this test can be used) for the duration of the COVID-19 declaration under Section 564(b)(1) of the Act, 21 U.S.C.section 360bbb-3(b)(1), unless the authorization is terminated  or revoked sooner.       Influenza A by PCR NEGATIVE NEGATIVE Final   Influenza B by PCR NEGATIVE NEGATIVE Final    Comment: (NOTE) The Xpert Xpress SARS-CoV-2/FLU/RSV plus assay is intended as an aid in the diagnosis of influenza from Nasopharyngeal swab specimens and should not be used as a sole basis for treatment. Nasal washings and aspirates are unacceptable for Xpert Xpress SARS-CoV-2/FLU/RSV testing.  Fact Sheet for Patients: EntrepreneurPulse.com.au  Fact Sheet for Healthcare Providers: IncredibleEmployment.be  This test is not yet approved or cleared by the [place] FDA and has been authorized for detection and/or diagnosis of SARS-CoV-2 by FDA under an Emergency Use Authorization (EUA). This EUA will remain in effect (meaning this test can be used) for the  duration of the COVID-19 declaration under Section 564(b)(1) of the Act, 21 U.S.C. section 360bbb-3(b)(1), unless the authorization is terminated or revoked.  Performed at [facility], [address] [place], [place] [address]      Labs: Basic Metabolic Panel: Recent Labs  Lab 01/07/21 0531 01/11/21 0452  NA 134* 132*  K 3.9 3.9  CL 101 100  CO2 27 27  GLUCOSE 155* 163*  BUN 11 14  CREATININE 1.10 1.21  CALCIUM 8.6* 8.7*  MG 1.9 1.7  PHOS 2.8 3.5   Liver Function Tests: No results for input(s): AST, ALT, ALKPHOS, BILITOT, PROT, ALBUMIN in the last 168 hours.  No results for input(s): LIPASE, AMYLASE in the last 168 hours. No results for input(s): AMMONIA in the last 168 hours. CBC: Recent Labs  Lab 01/07/21 0531 01/11/21 0452  WBC 6.9 6.8  HGB 11.6* 11.5*  HCT 35.6* 35.7*  MCV 92.7 91.8  PLT 336 302   Cardiac Enzymes: No results for input(s): CKTOTAL, CKMB, CKMBINDEX, TROPONINI in the last 168 hours. BNP: Invalid input(s): POCBNP CBG: Recent Labs  Lab 01/09/21 2038 01/09/21 2303 01/10/21 1617 01/11/21 0717 01/11/21 1109  GLUCAP 273* 151* 190* 157* 185*    Time  coordinating discharge:  36 minutes  Signed:  Orson Eva, DO [facility] Pager: [phone] 01/11/2021, 11:31 AM

## 2021-01-10 NOTE — TOC Progression Note (Signed)
Transition of Care [facility]) - Progression Note    Patient Details  Name: Albert Garrison MRN: [id number] Date of Birth: [birth year]  Transition of Care [facility]) CM/SW [place], LCSW Phone Number: 01/10/2021, 11:18 AM  Clinical Narrative:    CSW notified patient's son would like to speak to [facility]. CSW contacted patient's son and discussed discharge planning. CSW also contacted [facility] to inquire if patient's son could sign for patient. Claiborne Billings with [facility] reported that patient's brother who is [facility] POA would not approve someone else coming to sign admission's paperwork. TOC to follow.      Barriers to Discharge: Unsafe home situation  Expected Discharge Plan and Services   In-house Referral: Clinical Social Work       Expected Discharge Date: 01/08/21                                     Social Determinants of Health (SDOH) Interventions    Readmission Risk Interventions Readmission Risk Prevention Plan 01/01/2021  Transportation Screening Complete  PCP or Specialist Appt within 3-5 Days Complete  HRI or [place] Complete  Social Work Consult for [place] Planning/Counseling Complete  Palliative Care Screening Not Applicable  Medication Review Press photographer) Complete  Some recent data might be hidden

## 2021-01-11 DIAGNOSIS — R7881 Bacteremia: Secondary | ICD-10-CM | POA: Diagnosis not present

## 2021-01-11 DIAGNOSIS — N390 Urinary tract infection, site not specified: Secondary | ICD-10-CM | POA: Diagnosis not present

## 2021-01-11 LAB — BASIC METABOLIC PANEL
Anion gap: 5 (ref 5–15)
BUN: 14 mg/dL (ref 8–23)
CO2: 27 mmol/L (ref 22–32)
Calcium: 8.7 mg/dL — ABNORMAL LOW (ref 8.9–10.3)
Chloride: 100 mmol/L (ref 98–111)
Creatinine, Ser: 1.21 mg/dL (ref 0.61–1.24)
GFR, Estimated: >60 mL/min (ref >60)
Glucose, Bld: 163 mg/dL — ABNORMAL HIGH (ref 70–99)
Potassium: 3.9 mmol/L (ref 3.5–5.1)
Sodium: 132 mmol/L — ABNORMAL LOW (ref 135–145)

## 2021-01-11 LAB — CBC
HCT: 35.7 % — ABNORMAL LOW (ref 39.0–52.0)
Hemoglobin: 11.5 g/dL — ABNORMAL LOW (ref 13.0–17.0)
MCH: 29.6 pg (ref 26.0–34.0)
MCHC: 32.2 g/dL (ref 30.0–36.0)
MCV: 91.8 fL (ref 80.0–100.0)
Platelets: 302 10*3/uL (ref 150–400)
RBC: 3.89 MIL/uL — ABNORMAL LOW (ref 4.22–5.81)
RDW: 14.4 % (ref 11.5–15.5)
WBC: 6.8 10*3/uL (ref 4.0–10.5)
nRBC: 0 % (ref 0.0–0.2)

## 2021-01-11 LAB — MAGNESIUM: Magnesium: 1.7 mg/dL (ref 1.7–2.4)

## 2021-01-11 LAB — GLUCOSE, CAPILLARY
Glucose-Capillary: 154 mg/dL — ABNORMAL HIGH (ref 70–99)
Glucose-Capillary: 151 mg/dL — ABNORMAL HIGH (ref 70–99)
Glucose-Capillary: 136 mg/dL — ABNORMAL HIGH (ref 70–99)
Glucose-Capillary: 205 mg/dL — ABNORMAL HIGH (ref 70–99)
Glucose-Capillary: 157 mg/dL — ABNORMAL HIGH (ref 70–99)
Glucose-Capillary: 185 mg/dL — ABNORMAL HIGH (ref 70–99)

## 2021-01-11 LAB — RESP PANEL BY RT-PCR (FLU A&B, COVID) ARPGX2
Influenza A by PCR: NEGATIVE
Influenza B by PCR: NEGATIVE
SARS Coronavirus 2 by RT PCR: NEGATIVE

## 2021-01-11 LAB — PHOSPHORUS: Phosphorus: 3.5 mg/dL (ref 2.5–4.6)

## 2021-01-11 MED ORDER — HALOPERIDOL 0.5 MG PO TABS: 0.5000 mg | ORAL_TABLET | Freq: Two times a day (BID) | ORAL | 1 refills | Status: AC

## 2021-01-11 MED ORDER — TAMSULOSIN HCL 0.4 MG PO CAPS: 0.4000 mg | ORAL_CAPSULE | Freq: Every day | ORAL | 1 refills | Status: DC

## 2021-01-11 MED ORDER — METFORMIN HCL 500 MG PO TABS: 500.0000 mg | ORAL_TABLET | Freq: Every day | ORAL | 1 refills | Status: AC

## 2021-01-11 MED ORDER — ASPIRIN 81 MG PO CHEW: 81.0000 mg | CHEWABLE_TABLET | Freq: Every day | ORAL | 1 refills | Status: AC

## 2021-01-11 MED ORDER — ATORVASTATIN CALCIUM 40 MG PO TABS: 40.0000 mg | ORAL_TABLET | Freq: Every day | ORAL | 1 refills | Status: AC

## 2021-01-11 MED ORDER — ADULT MULTIVITAMIN W/MINERALS CH: 1.0000 | ORAL_TABLET | Freq: Every day | ORAL | 1 refills | Status: AC

## 2021-01-11 MED ORDER — INSULIN ASPART 100 UNIT/ML IJ SOLN: 0.0000 [IU] | Freq: Three times a day (TID) | INTRAMUSCULAR | 1 refills | Status: DC

## 2021-01-11 MED ORDER — MAGNESIUM OXIDE -MG SUPPLEMENT 400 (240 MG) MG PO TABS
400.0000 mg | ORAL_TABLET | Freq: Every day | ORAL | Status: DC
  Administered 2021-01-11: 400 mg via ORAL
  Filled 2021-01-11: qty 1

## 2021-01-11 MED ORDER — AMLODIPINE BESYLATE 10 MG PO TABS: 10.0000 mg | ORAL_TABLET | Freq: Every day | ORAL | 1 refills | Status: AC

## 2021-01-11 MED ORDER — MAGNESIUM OXIDE -MG SUPPLEMENT 400 (240 MG) MG PO TABS: 400.0000 mg | ORAL_TABLET | Freq: Every day | ORAL | 1 refills | Status: AC

## 2021-01-11 MED ORDER — METOPROLOL TARTRATE 25 MG PO TABS: 12.5000 mg | ORAL_TABLET | Freq: Two times a day (BID) | ORAL | 1 refills | Status: AC

## 2021-01-11 NOTE — TOC Transition Note (Signed)
Transition of Care [facility]) - CM/SW Discharge Note   Patient Details  Name: Albert Garrison MRN: [id number] Date of Birth: [birth year]  Transition of Care [facility]) CM/SW Contact:  Salome Arnt, LCSW Phone Number: 01/11/2021, 2:42 PM   Clinical Narrative:  Pt's brother has completed admission paperwork at [facility] today. Facility to pick up pt this afternoon. D/C summary and FL2 sent to facility. COVID negative today. Discussed d/c with Juliann Pulse at [facility] and pt's brother and they are both in agreement.      Final next level of care: Assisted Living Barriers to Discharge: Barriers Resolved   Patient Goals and CMS Choice Patient states their goals for this hospitalization and ongoing recovery are:: pt confused, family wants help with placement CMS Medicare.gov Compare Post Acute Care list provided to:: Patient Represenative (must comment) Choice offered to / list presented to : Sibling  Discharge Placement                Patient to be transferred to facility by: facility Lucianne Lei Name of family member notified: [name]- brother Patient and family notified of of transfer: 01/11/21  Discharge Plan and Services In-house Referral: Clinical Social Work              DME Arranged: N/A                    Social Determinants of Health ([place]) Interventions     Readmission Risk Interventions Readmission Risk Prevention Plan 01/01/2021  Transportation Screening Complete  PCP or Specialist Appt within 3-5 Days Complete  HRI or Home Care Consult Complete  Social Work Consult for [place] Planning/Counseling Complete  Palliative Care Screening Not Applicable  Medication Review Press photographer) Complete  Some recent data might be hidden

## 2021-01-11 NOTE — NC FL2 (Signed)
[place] LEVEL OF CARE SCREENING TOOL     IDENTIFICATION  Patient Name: Albert Garrison Birthdate: 03-Nov-1945 Sex: male Admission Date (Current Location): 12/25/2020  [facility] and [place] Number:  [employer] and Address:  [facility] [address] [place]      Provider Number: [id number]  Attending Physician Name and Address:  Orson Eva, MD  Relative Name and Phone Number:  Wendell, [place]os[place] (Brother)   [phone]    Current Level of Care: Hospital Recommended Level of Care: [place], Memory Care Prior Approval Number:    Date Approved/Denied:   PASRR Number: [id number]  Discharge Plan: Other (Comment)    Current Diagnoses: Patient Active Problem List   Diagnosis Date Noted   Acute lower UTI 12/25/2020   Dysphagia 12/22/2020   Acute hypoxemic respiratory failure ([place]) 11/30/2020   Shock circulatory ([place]) 11/30/2020   Cardiac arrest ([place]) 11/30/2020   Other specified cardiac arrhythmias--- sleep apnea- arrhythmias when he desaturates  11/30/2020   Endotracheally intubated 11/30/2020   SBO (small bowel obstruction) ([place]) 11/28/2020   History of stroke 08/24/2017   Altered mental status 08/23/2017   Sleep apnea 08/23/2017   Elevated troponin 08/23/2017   Abnormal CT of the head 08/23/2017   Hypertension 08/23/2017   Type 2 diabetes mellitus ([place]) 08/23/2017   Syncope 08/23/2017   S/P carpal tunnel release right 07/13/17 07/27/2017   AKI (acute kidney injury) ([place]) 11/29/2015   Lower GI bleed 11/28/2015   Rectal bleed 11/28/2015   Rectal bleeding 11/28/2015   Benign prostatic hyperplasia with urinary retention 01/20/2015   Urinary retention 05/21/2014   ARF (acute renal failure) ([place]) 03/26/2014   Type 2 diabetes mellitus without complication, with long-term current use of insulin ([place]) 03/26/2014   UTI (lower urinary tract infection) 03/24/2014   Hyponatremia 03/24/2014   Acute encephalopathy  03/24/2014    Orientation RESPIRATION BLADDER Height & Weight     Self  Normal Incontinent- Foley catheter Weight: 168 lb 10.4 oz (76.5 kg) Height:  5\' 11"  (180.3 cm)  BEHAVIORAL SYMPTOMS/MOOD NEUROLOGICAL BOWEL NUTRITION STATUS      Continent Diet (Heart Healthy / Carb Modified --dysphagia 2 with thin liquid)  AMBULATORY STATUS COMMUNICATION OF NEEDS Skin   Limited Assist Verbally Bruising                       Personal Care Assistance Level of Assistance  Bathing, Feeding, Dressing Bathing Assistance: Limited assistance Feeding assistance: Limited assistance Dressing Assistance: Limited assistance     Functional Limitations Info  Sight, Hearing, Speech Sight Info: Adequate Hearing Info: Impaired Speech Info: Adequate    SPECIAL CARE FACTORS FREQUENCY  PT (By licensed PT), OT (By licensed OT)     PT Frequency: 3x/week OT Frequency: 3x/week            Contractures Contractures Info: Not present    Additional Factors Info  Code Status, Allergies, Insulin Sliding Scale, Psychotropic Code Status Info: Full code Allergies Info: No known allergies. Psychotropic Info: Haldol Insulin Sliding Scale Info: insulin aspart 100 UNIT/ML injection  Commonly known as: novoLOG  Inject 0-15 Units into the skin 3 (three) times daily with meals. CBG < 70:          Implement Hypoglycemia Standing Orders and refer to Hypoglycemia Standing Orders sidebar report              CBG 70 - 120:  0 units       CBG 121 - 150:         2 units       CBG 151 - 200:         3 units       CBG 201 - 250:         5 units       CBG 251 - 300:         8 units       CBG 301 - 350:         11 units       CBG 351 - 400:         15 units       Current Medications (01/11/2021):  This is the current hospital active medication list Current Facility-Administered Medications  Medication Dose Route Frequency Provider Last Rate Last Admin   acetaminophen (TYLENOL) tablet 650 mg  650 mg Oral Q6H PRN  Adefeso, Olad[place], DO   650 mg at 01/05/21 1555   amLODipine (NORVASC) tablet 10 mg  10 mg Oral Daily Truett Mainland, DO   10 mg at 01/11/21 1660   aspirin chewable tablet 81 mg  81 mg Oral Daily Truett Mainland, DO   81 mg at 01/11/21 0849   atorvastatin (LIPITOR) tablet 40 mg  40 mg Oral [id number] Truett Mainland, DO   40 mg at 01/10/21 1757   Chlorhexidine Gluconate Cloth 2 % PADS 6 each  6 each Topical Daily Truett Mainland, DO   6 each at 01/11/21 0850   enoxaparin (LOVENOX) injection 40 mg  40 mg Subcutaneous Q24H Manuella Ghazi, Pratik D, DO   40 mg at 01/10/21 1611   haloperidol (HALDOL) tablet 0.5 mg  0.5 mg Oral BID Heath Lark D, DO   0.5 mg at 01/11/21 0848   haloperidol lactate (HALDOL) injection 1-2 mg  1-2 mg Intravenous Q6H PRN Allie Bossier, MD   2 mg at 01/09/21 1106   insulin aspart (novoLOG) injection 0-15 Units  0-15 Units Subcutaneous TID WC Truett Mainland, DO   3 Units at 01/11/21 0109   magnesium oxide (MAG-OX) tablet 400 mg  400 mg Oral Daily Tat, Shanon Brow, MD   400 mg at 01/11/21 1003   metoprolol tartrate (LOPRESSOR) tablet 12.5 mg  12.5 mg Oral BID Orson Eva, MD   12.5 mg at 01/11/21 0849   ondansetron (ZOFRAN) tablet 4 mg  4 mg Oral Q6H PRN Truett Mainland, DO       Or   ondansetron [facility]) injection 4 mg  4 mg Intravenous Q6H PRN Truett Mainland, DO       phenol (CHLORASEPTIC) mouth spray 1 spray  1 spray Mouth/Throat PRN Manuella Ghazi, Pratik D, DO       tamsulosin (FLOMAX) capsule 0.4 mg  0.4 mg Oral QPC supper Truett Mainland, DO   0.4 mg at 01/10/21 1757     Discharge Medications: TAKE these medications     amLODipine 10 MG tablet Commonly known as: NORVASC Take 1 tablet (10 mg total) by mouth daily.    aspirin 81 MG chewable tablet Chew 1 tablet (81 mg total) by mouth daily.    atorvastatin 40 MG tablet Commonly known as: LIPITOR Take 1 tablet (40 mg total) by mouth daily.    haloperidol 0.5 MG tablet Commonly known as: HALDOL Take 1 tablet (0.5 mg total) by  mouth 2 (two) times daily.    insulin aspart  100 UNIT/ML injection Commonly known as: novoLOG Inject 0-15 Units into the skin 3 (three) times daily with meals. CBG < 70:          Implement Hypoglycemia Standing Orders and refer to Hypoglycemia Standing Orders sidebar report             CBG 70 - 120:           0 units      CBG 121 - 150:         2 units      CBG 151 - 200:         3 units      CBG 201 - 250:         5 units      CBG 251 - 300:         8 units      CBG 301 - 350:         11 units      CBG 351 - 400:         15 units    magnesium oxide 400 (240 Mg) MG tablet Commonly known as: MAG-OX Take 1 tablet (400 mg total) by mouth daily. Start taking on: January 12, 2021    metFORMIN 500 MG tablet Commonly known as: GLUCOPHAGE Take 1 tablet (500 mg total) by mouth daily with breakfast.    metoprolol tartrate 25 MG tablet Commonly known as: LOPRESSOR Take 0.5 tablets (12.5 mg total) by mouth 2 (two) times daily. What changed: how much to take    multivitamin with minerals Tabs tablet Take 1 tablet by mouth daily.    tamsulosin 0.4 MG Caps capsule Commonly known as: FLOMAX Take 1 capsule (0.4 mg total) by mouth daily after supper.      Relevant Imaging Results:  Relevant Lab Results:   Additional Information Pt SSN [id number]  Salome Arnt, LCSW

## 2021-01-11 NOTE — Care Management Important Message (Signed)
Important Message  Patient Details  Name: LARENCE THONE MRN: [id number] Date of Birth: 1945-11-30   Medicare Important Message Given:  Yes     [name] 01/11/2021, 10:30 AM

## 2021-01-11 NOTE — Progress Notes (Signed)
Report called to the nurse at the  receiving facility [facility]. Discharge paper work will be placed in packet for facility. Awaiting for transport.

## 2021-01-11 NOTE — Progress Notes (Addendum)
PROGRESS NOTE  Albert Garrison:[id number] DOB: 1945-06-24 DOA: 12/25/2020 PCP: Caprice Renshaw, MD  Brief History:  75 y.o. male with a history of type 2 diabetes, BPH, history of stroke, GERD, history of Cardiac Arrest.  The patient was recently admitted on 11/27/2020 and was discharged approximately 3 days prior to presentation from the hospital.  During that hospitalization, the patient went into cardiac arrest and had acute hypoxemic respiratory failure, he was intubated and ROSC was obtained after about 1 minute.  The patient now presents with hematuria.  He has been admitted with some acute metabolic encephalopathy in the setting of catheter associated UTI.  He continues to have some confusion, but he does not qualify for SNF and therefore is unsafe for discharge to home.  APS evaluation pending as well as guardianship.  He was treated for group A strep bacteremia and completed a 10-day course of Rocephin.  No noted agitation for the past 48 hours, labs and vital signs have been stable.  CSW is assisting with DC planning.  Plan for discharge to memory care assisted living facility.    01/05/2021: He was seen and examined this morning.  He was alert and pleasantly confused.  He had no new complaints.   01/07/21:  remains pleasantly confused.  No complaints.  Remains medically stable. Doing better with PT.  CXR neg--personally reviewed.  COVID-19 negative   01/10/21--remains pleaantly confused.  No complaints.  Eating well.  Remains medically stable for D/C  01/11/21--sitting up in chair.  Pleasantly confused.  Follows commands and remains redirectable without agitation. Remains medically stable for d/c.  No complaints  Assessment/Plan: Acute metabolic encephalopathy, persistent Plan to discharge to memory care assisted living facility. He completed 10-day course of IV antibiotics Rocephin for Group A strep bacteremia and UTI on 01/03/21. -now remains pleasantly confused--suspect he is  near baseline -He has been A&O x 2 and remains calm and re-directable -B12, folate, TSH normal -mental status has remain stable/improved for several days prior to d/c   BPH with chronic urinary retention, Foley catheter in place. Tried voiding trial 9/27, but he continued to have issues with urine retention -Foley catheter replaced on 9/27 and he will need outpatient urology follow-up Continue Flomax Continue to monitor urine output. -referral sent to [facility]   Mild euvolemic hyponatremia Serum sodium 134 Monitor for now Encourage oral intake   Treated Group A strep bacteremia Completed Rocephin on 01/03/2021. -No need to evaluate further for endocarditis -Discussed case with Dr. Gale Journey with ID -Repeat blood cultures ordered on 9/29 with no growth final.   Type 2 diabetes with hyperglycemia, uncontrolled Last hemoglobin A1c 8.6 on 11/27/20 -DYS 2 diet Continue insulin sliding scale. -restart metformin after d/c   History of stroke -Continue aspirin and Lipitor -DYS 2 diet with thin liquids   History of NSTEMI -Continue aspirin, Lipitor, beta-blocker  -no chest pain presently   HTN -continue amlodipine, metoprolol  Hypomagnesemia -replete-mag ox 400 mg daily    Status is: Inpatient  Remains inpatient appropriate because:Unsafe d/c plan  Dispo:  Patient From: Home  Planned Disposition: ALF  Medically stable for discharge: No          Family Communication:   no Family at bedside  Consultants:  none  Code Status:  FULL   DVT Prophylaxis:[place] Lovenox   Procedures: As Listed in Progress Note Above  Antibiotics: None      Subjective: Patient denies fevers, chills,  headache, chest pain, dyspnea, nausea, vomiting, diarrhea, abdominal pain,    Objective: Vitals:   01/10/21 2007 01/11/21 0300 01/11/21 0815 01/11/21 0849  BP: 110/76 135/64 112/72 118/60  Pulse: (!) 57 63 64 65  Resp: 19 18 18    Temp: 97.9 F (36.6 C) 97.7 F (36.5 C)  97.8 F (36.6 C)   TempSrc: Oral Axillary Oral   SpO2: 96% 96% 99%   Weight:      Height:        Intake/Output Summary (Last 24 hours) at 01/11/2021 1137 Last data filed at 01/11/2021 0820 Gross per 24 hour  Intake 1080 ml  Output 3150 ml  Net -2070 ml   Weight change:  Exam:  General:  Pt is alert, follows commands appropriately, not in acute distress HEENT: No icterus, No thrush, No neck mass, [place]/AT Cardiovascular: RRR, S1/S2, no rubs, no gallops Respiratory: CTA bilaterally, no wheezing, no crackles, no rhonchi Abdomen: Soft/+BS, non tender, non distended, no guarding Extremities: No edema, No lymphangitis, No petechiae, No rashes, no synovitis   Data Reviewed: I have personally reviewed following labs and imaging studies Basic Metabolic Panel: Recent Labs  Lab 01/07/21 0531 01/11/21 0452  NA 134* 132*  K 3.9 3.9  CL 101 100  CO2 27 27  GLUCOSE 155* 163*  BUN 11 14  CREATININE 1.10 1.21  CALCIUM 8.6* 8.7*  MG 1.9 1.7  PHOS 2.8 3.5   Liver Function Tests: No results for input(s): AST, ALT, ALKPHOS, BILITOT, PROT, ALBUMIN in the last 168 hours. No results for input(s): LIPASE, AMYLASE in the last 168 hours. No results for input(s): AMMONIA in the last 168 hours. Coagulation Profile: No results for input(s): INR, PROTIME in the last 168 hours. CBC: Recent Labs  Lab 01/07/21 0531 01/11/21 0452  WBC 6.9 6.8  HGB 11.6* 11.5*  HCT 35.6* 35.7*  MCV 92.7 91.8  PLT 336 302   Cardiac Enzymes: No results for input(s): CKTOTAL, CKMB, CKMBINDEX, TROPONINI in the last 168 hours. BNP: Invalid input(s): POCBNP CBG: Recent Labs  Lab 01/09/21 2038 01/09/21 2303 01/10/21 1617 01/11/21 0717 01/11/21 1109  GLUCAP 273* 151* 190* 157* 185*   HbA1C: No results for input(s): HGBA1C in the last 72 hours. Urine analysis:    Component Value Date/Time   COLORURINE BROWN (A) 12/25/2020 1238   APPEARANCEUR HAZY (A) 12/25/2020 1238   LABSPEC 1.015 12/25/2020 1238    PHURINE 5.0 12/25/2020 1238   GLUCOSEU >=500 (A) 12/25/2020 1238   HGBUR LARGE (A) 12/25/2020 1238   BILIRUBINUR NEGATIVE 12/25/2020 1238   KETONESUR NEGATIVE 12/25/2020 1238   PROTEINUR 100 (A) 12/25/2020 1238   UROBILINOGEN 0.2 03/24/2014 1320   NITRITE NEGATIVE 12/25/2020 1238   LEUKOCYTESUR MODERATE (A) 12/25/2020 1238   Sepsis Labs: @LABRCNTIP (procalcitonin:4,lacticidven:4) ) Recent Results (from the past 240 hour(s))  Resp Panel by RT-PCR (Flu A&B, Covid) Nasopharyngeal Swab     Status: None   Collection Time: 01/07/21  1:44 PM   Specimen: Nasopharyngeal Swab; Nasopharyngeal(NP) swabs in vial transport medium  Result Value Ref Range Status   SARS Coronavirus 2 by RT PCR NEGATIVE NEGATIVE Final    Comment: (NOTE) SARS-CoV-2 target nucleic acids are NOT DETECTED.  The SARS-CoV-2 RNA is generally detectable in upper respiratory specimens during the acute phase of infection. The lowest concentration of SARS-CoV-2 viral copies this assay can detect is 138 copies/mL. A negative result does not preclude SARS-Cov-2 infection and should not be used as the sole basis for treatment or other patient management  decisions. A negative result may occur with  improper specimen collection/handling, submission of specimen other than nasopharyngeal swab, presence of viral mutation(s) within the areas targeted by this assay, and inadequate number of viral copies(<138 copies/mL). A negative result must be combined with clinical observations, patient history, and epidemiological information. The expected result is Negative.  Fact Sheet for Patients:  EntrepreneurPulse.com.au  Fact Sheet for Healthcare Providers:  IncredibleEmployment.be  This test is no t yet approved or cleared by the [place] FDA and  has been authorized for detection and/or diagnosis of SARS-CoV-2 by FDA under an Emergency Use Authorization (EUA). This EUA will remain  in  effect (meaning this test can be used) for the duration of the COVID-19 declaration under Section 564(b)(1) of the Act, 21 U.S.C.section 360bbb-3(b)(1), unless the authorization is terminated  or revoked sooner.       Influenza A by PCR NEGATIVE NEGATIVE Final   Influenza B by PCR NEGATIVE NEGATIVE Final    Comment: (NOTE) The Xpert Xpress SARS-CoV-2/FLU/RSV plus assay is intended as an aid in the diagnosis of influenza from Nasopharyngeal swab specimens and should not be used as a sole basis for treatment. Nasal washings and aspirates are unacceptable for Xpert Xpress SARS-CoV-2/FLU/RSV testing.  Fact Sheet for Patients: EntrepreneurPulse.com.au  Fact Sheet for Healthcare Providers: IncredibleEmployment.be  This test is not yet approved or cleared by the [place] FDA and has been authorized for detection and/or diagnosis of SARS-CoV-2 by FDA under an Emergency Use Authorization (EUA). This EUA will remain in effect (meaning this test can be used) for the duration of the COVID-19 declaration under Section 564(b)(1) of the Act, 21 U.S.C. section 360bbb-3(b)(1), unless the authorization is terminated or revoked.  Performed at [facility], [address] [place], [place] [address]      Scheduled Meds:  amLODipine  10 mg Oral Daily   aspirin  81 mg Oral Daily   atorvastatin  40 mg Oral q1800   Chlorhexidine Gluconate Cloth  6 each Topical Daily   enoxaparin (LOVENOX) injection  40 mg Subcutaneous Q24H   haloperidol  0.5 mg Oral BID   insulin aspart  0-15 Units Subcutaneous TID WC   magnesium oxide  400 mg Oral Daily   metoprolol tartrate  12.5 mg Oral BID   tamsulosin  0.4 mg Oral QPC supper   Continuous Infusions:  Procedures/Studies: DG CHEST PORT 1 VIEW  Result Date: 01/07/2021 CLINICAL DATA:  TB screening. EXAM: PORTABLE CHEST 1 VIEW COMPARISON:  Chest x-ray 12/25/2020. FINDINGS: The heart size and mediastinal contours are  within normal limits. Both lungs are clear. Cervical spinal fusion plate is present. The visualized skeletal structures are otherwise unremarkable. IMPRESSION: No active disease. Electronically Signed   By: Ronney Asters M.D.   On: 01/07/2021 15:46   DG Chest Port 1 View  Result Date: 12/25/2020 CLINICAL DATA:  Hematuria and fever. EXAM: PORTABLE CHEST 1 VIEW COMPARISON:  December 01, 2020 FINDINGS: Mild atelectasis and/or infiltrate is seen within the bilateral lung bases. There is no evidence of a pleural effusion or pneumothorax. The heart size and mediastinal contours are within normal limits. Radiopaque fusion plates and screws are seen overlying the cervical spine. IMPRESSION: Mild bibasilar atelectasis and/or infiltrate. Electronically Signed   By: Virgina Norfolk M.D.   On: 12/25/2020 16:56   DG Swallowing Func-Speech Pathology  Result Date: 12/17/2020 Table formatting from the original result was not included. Objective Swallowing Evaluation: Type of Study: MBS-Modified Barium Swallow Study  Patient Details Name:  Albert Garrison MRN: [id number] Date of Birth: 03/05/1946 Today's Date: 12/17/2020 Time: SLP Start Time (ACUTE ONLY): 2683 -SLP Stop Time (ACUTE ONLY): 4196 SLP Time Calculation (min) (ACUTE ONLY): 15 min Past Medical History: Past Medical History: Diagnosis Date  Arthritis   BPH (benign prostatic hyperplasia)   CKD (chronic kidney disease), stage II   Complicated UTI (urinary tract infection) 03/2014  Depression   GERD (gastroesophageal reflux disease)   History of pneumonia   History of stroke   HOH (hard of hearing)   Hypertension   Hyponatremia   Lower GI bleed 2017  a. ? due to polyp.  NSVT (nonsustained ventricular tachycardia) (HCC)   Rheumatic fever   Childhood  Sleep apnea   Does not use CPAP  Type 2 diabetes mellitus ([place])  Past Surgical History: Past Surgical History: Procedure Laterality Date  CARPAL TUNNEL RELEASE Left 2010  CARPAL TUNNEL RELEASE Right 07/13/2017  Procedure: CARPAL  TUNNEL RELEASE;  Surgeon: Carole Civil, MD;  Location: [facility] ORS;  Service: Orthopedics;  Laterality: Right;  CERVICAL SPINE SURGERY  2010  COLONOSCOPY WITH PROPOFOL N/A 11/30/2015  Procedure: COLONOSCOPY WITH PROPOFOL;  Surgeon: Daneil Dolin, MD;  Location: [facility] ENDO SUITE;  Service: Endoscopy;  Laterality: N/A;  KNEE ARTHROSCOPY  [place] surgery - broken nose    POLYPECTOMY  11/30/2015  Procedure: POLYPECTOMY;  Surgeon: Daneil Dolin, MD;  Location: [facility] END[facility] SUITE;  Service: Endoscopy;;  polyp at ascending colon, rectal polyp  TRANSURETHRAL INCISION OF PROSTATE N/A 01/20/2015  Procedure: TRANSURETHRAL INCISION OF THE PROSTATE (TUIP);  Surgeon: Irine Seal, MD;  Location: [facility] ORS;  Service: Urology;  Laterality: N/A; HPI: 75 y.o. male presented to [facility] ED with diarrhea, decreased p.o. intake and nausea. Pt admitted with acute encephalopathy and symptomatic hyponatremia. Abdominal distension 8/27. CT abd/pelvis (8/28) (+) SBO and NGT placed. 8/28 pt went into cardiac arrest, concern for possible STEMI. Transferred to [facility] for further treatment. CT head (8/28) revealed no acute intracranial abnormality. Chest xray (8/30) noted "appearance of airspace disease in the right middle lobe. Small left pleural effusion". Hosptial admission complicated by continued impulsivity and agitation with pt pulling out multiple NGT's despite restraints.  ETT: 8/28-8/30. PMHx significant for CKD, BPH, DMII, Hx of CVA, HTN, and GERD. SLE (08/23/2017) revealed cognitive communication deficits post CVA.  Subjective: Pt seen in radiology for instrumental assessment of swallow function and safety. No family present. Assessment / Plan / Recommendation CHL IP CLINICAL IMPRESSIONS 12/17/2020 Clinical Impression Pt presents with ongoing dysphagia with several factors that could be persistently impacting effective swallowing. Pt has a prior ACDF with hardware present from C3-C6. His brother denies any knowledge of  baseline impairment, but suspect pt may have had a mild change in swallowing since that surgery that he compensated for and can no longer discuss. He is noted to have decreased epiglottic deflection, with epiglottic movement restricted by posterior pharyngeal wall, keeping vestibule slightly open at the height of laryngeal elevation. Pt has instances of bolus penetratating the vestibule before hyoid excursion with thin and nectar thick liquids squeezing between the posterior process of the glottis during hyoid excursion. Pts sensation is absent and voice is slightly hoarse. Question the integrity of glottic competence given this finding. Pt was unable to sustain a chin tuck or achieve any other strategies gievn mentation and decreased neck ROM. Recommend pt continue a finely chopped diet given rapid intake of solids with partial mastication and spillage of solids to  lower pharynx during ongoing masticaiton. Pt should also continue honey thick liquids. Discussed findings with brother. Recommend a repeat MBS a few weeks after NG tube removal for potential upgrade as presence of NG could be another complicating factor. If there is no improvement in airway protection with liquids after another 14 days approximately, would recommend referral to ENT to examine larynx directly. SLP Visit Diagnosis Dysphagia, oropharyngeal phase (R13.12) Attention and concentration deficit following -- Frontal lobe and executive function deficit following -- Impact on safety and function Moderate aspiration risk   CHL IP TREATMENT RECOMMENDATION 12/17/2020 Treatment Recommendations Therapy as outlined in treatment plan below   Prognosis 12/17/2020 Prognosis for Safe Diet Advancement Good Barriers to Reach Goals -- Barriers/Prognosis Comment -- CHL IP DIET RECOMMENDATION 12/17/2020 SLP Diet Recommendations Dysphagia 2 (Fine chop) solids;Honey thick liquids Liquid Administration via Cup;Straw;Spoon Medication Administration Crushed with puree  Compensations Minimize environmental distractions;Slow rate;Small sips/bites Postural Changes Remain semi-upright after after feeds/meals (Comment)   CHL IP OTHER RECOMMENDATIONS 12/17/2020 Recommended Consults -- Oral Care Recommendations Oral care BID Other Recommendations --   CHL IP FOLLOW UP RECOMMENDATIONS 12/17/2020 Follow up Recommendations Skilled Nursing facility   [facility] IP FREQUENCY AND DURATION 12/17/2020 Speech Therapy Frequency (ACUTE ONLY) min 2x/week Treatment Duration 2 weeks      CHL IP ORAL PHASE 12/17/2020 Oral Phase Impaired Oral - Pudding Teaspoon -- Oral - Pudding Cup -- Oral - Honey Teaspoon NT Oral - Honey Cup WFL Oral - Nectar Teaspoon WFL Oral - Nectar Cup WFL Oral - Nectar Straw WFL Oral - Thin Teaspoon -- Oral - Thin Cup WFL Oral - Thin Straw WFL Oral - Puree WFL Oral - Mech Soft -- Oral - Regular Premature spillage;Decreased bolus cohesion;Delayed oral transit;Holding of bolus Oral - Multi-Consistency -- Oral - Pill -- Oral Phase - Comment --  CHL IP PHARYNGEAL PHASE 12/17/2020 Pharyngeal Phase Impaired Pharyngeal- Pudding Teaspoon -- Pharyngeal -- Pharyngeal- Pudding Cup -- Pharyngeal -- Pharyngeal- Honey Teaspoon NT Pharyngeal -- Pharyngeal- Honey Cup WFL Pharyngeal -- Pharyngeal- Nectar Teaspoon NT Pharyngeal -- Pharyngeal- Nectar Cup Penetration/Aspiration before swallow;Penetration/Aspiration during swallow;Reduced epiglottic inversion;Reduced airway/laryngeal closure;Trace aspiration Pharyngeal Material enters airway, CONTACTS cords and not ejected out;Material enters airway, passes BELOW cords without attempt by patient to eject out (silent aspiration);Material enters airway, passes BELOW cords then ejected out Pharyngeal- Nectar Straw Penetration/Aspiration before swallow;Penetration/Aspiration during swallow;Reduced epiglottic inversion;Reduced airway/laryngeal closure;Trace aspiration Pharyngeal Material enters airway, CONTACTS cords and not ejected out;Material enters airway, passes  BELOW cords without attempt by patient to eject out (silent aspiration) Pharyngeal- Thin Teaspoon -- Pharyngeal -- Pharyngeal- Thin Cup Reduced epiglottic inversion;Penetration/Aspiration before swallow;Penetration/Aspiration during swallow;Trace aspiration;Moderate aspiration;Reduced airway/laryngeal closure Pharyngeal Material enters airway, passes BELOW cords without attempt by patient to eject out (silent aspiration);Material enters airway, CONTACTS cords and then ejected out;Material enters airway, CONTACTS cords and not ejected out Pharyngeal- Thin Straw Reduced epiglottic inversion;Penetration/Aspiration before swallow;Penetration/Aspiration during swallow;Trace aspiration;Moderate aspiration;Reduced airway/laryngeal closure Pharyngeal Material enters airway, passes BELOW cords without attempt by patient to eject out (silent aspiration);Material enters airway, CONTACTS cords and then ejected out;Material enters airway, CONTACTS cords and not ejected out Pharyngeal- Puree WFL Pharyngeal -- Pharyngeal- Mechanical Soft -- Pharyngeal -- Pharyngeal- Regular Pharyngeal residue - valleculae;Pharyngeal residue - pyriform Pharyngeal -- Pharyngeal- Multi-consistency -- Pharyngeal -- Pharyngeal- Pill -- Pharyngeal -- Pharyngeal Comment --  CHL IP CERVICAL ESOPHAGEAL PHASE 12/09/2020 Cervical Esophageal Phase WFL Pudding Teaspoon -- Pudding Cup -- Honey Teaspoon -- Honey Cup -- Nectar Teaspoon -- Nectar Cup -- Nectar Straw --  Thin Teaspoon -- Thin Cup -- Thin Straw -- Puree -- Mechanical Soft -- Regular -- Multi-consistency -- Pill -- Cervical Esophageal Comment -- DeBlois, Katherene Ponto 12/17/2020, 11:52 AM               Orson Eva, DO  [facility]  If 7PM-7AM, please contact night-coverage [email or web address] Password [email or web address] 01/11/2021, 11:37 AM   LOS: 17 days

## 2021-01-12 ENCOUNTER — Telehealth: Payer: Self-pay

## 2021-01-12 DIAGNOSIS — R338 Other retention of urine: Secondary | ICD-10-CM | POA: Diagnosis not present

## 2021-01-12 DIAGNOSIS — I2510 Atherosclerotic heart disease of native coronary artery without angina pectoris: Secondary | ICD-10-CM | POA: Diagnosis not present

## 2021-01-12 DIAGNOSIS — E1100 Type 2 diabetes mellitus with hyperosmolarity without nonketotic hyperglycemic-hyperosmolar coma (NKHHC): Secondary | ICD-10-CM | POA: Diagnosis not present

## 2021-01-12 DIAGNOSIS — E785 Hyperlipidemia, unspecified: Secondary | ICD-10-CM | POA: Diagnosis not present

## 2021-01-12 DIAGNOSIS — E1165 Type 2 diabetes mellitus with hyperglycemia: Secondary | ICD-10-CM | POA: Diagnosis not present

## 2021-01-12 NOTE — Telephone Encounter (Signed)
Left message for facility coordinator to call back to schedule NP appt with Dr. Felipa Eth for Thursday, October 13th in am.

## 2021-01-12 NOTE — Telephone Encounter (Signed)
-----   Message from Orson Eva, MD sent at 01/08/2021 10:46 AM EDT ----- Can you please schedule outpatient follow up for voiding trial?  Patient will be residing at [facility].  Thank you,  Shanon Brow

## 2021-01-12 NOTE — Telephone Encounter (Signed)
-----   Message from Orson Eva, MD sent at 01/08/2021 10:46 AM EDT ----- Can you please schedule outpatient follow up for voiding trial?  Patient will be residing at [facility].  Thank you,  Shanon Brow

## 2021-01-14 ENCOUNTER — Other Ambulatory Visit: Payer: Self-pay

## 2021-01-14 ENCOUNTER — Ambulatory Visit (INDEPENDENT_AMBULATORY_CARE_PROVIDER_SITE_OTHER): Payer: Medicare Other | Admitting: Urology

## 2021-01-14 ENCOUNTER — Encounter: Payer: Self-pay | Admitting: Urology

## 2021-01-14 VITALS — BP 122/68 | HR 52 | Temp 98.6°F | Wt 168.0 lb

## 2021-01-14 DIAGNOSIS — N138 Other obstructive and reflux uropathy: Secondary | ICD-10-CM | POA: Diagnosis not present

## 2021-01-14 DIAGNOSIS — R339 Retention of urine, unspecified: Secondary | ICD-10-CM

## 2021-01-14 DIAGNOSIS — N401 Enlarged prostate with lower urinary tract symptoms: Secondary | ICD-10-CM

## 2021-01-14 DIAGNOSIS — Z87440 Personal history of urinary (tract) infections: Secondary | ICD-10-CM

## 2021-01-14 NOTE — Progress Notes (Signed)
Urological Symptom Review  Patient is experiencing the following symptoms: Urinary tract infection   Review of Systems  Gastrointestinal (upper)  : Negative for upper GI symptoms  Gastrointestinal (lower) : Negative for lower GI symptoms  Constitutional : Negative for symptoms  Skin: Negative for skin symptoms  Eyes: Negative for eye symptoms  Ear/Nose/Throat : Negative for Ear/Nose/Throat symptoms  Hematologic/Lymphatic: Easy bruising  Cardiovascular : Negative for cardiovascular symptoms  Respiratory : Negative for respiratory symptoms  Endocrine: Negative for endocrine symptoms  Musculoskeletal: Back pain  Neurological: Negative for neurological symptoms  Psychologic: Negative for psychiatric symptoms

## 2021-01-14 NOTE — Progress Notes (Signed)
Assessment: 1. Urinary retention   2. BPH with obstruction/lower urinary tract symptoms   3. History of UTI      Plan: Foley catheter removed today after successful voiding trial. Continue tamsulosin 0.4 mg daily. Begin Augmentin 500 mg twice daily x7 days following catheter removal. Return to office in 7-10 days for bladder scan and urinalysis.   I advised the patient and his caregiver to return to the office or emergency room if he is unable to void later today or this evening.  Chief Complaint:  Chief Complaint  Patient presents with   Urinary Retention     History of Present Illness:  Albert Garrison is a 75 y.o. year old male who is seen in consultation from Caprice Renshaw, MD for evaluation of BPH with obstruction and urinary retention.  He had a catheter placed during a hospitalization in early September 2022.  He apparently failed voiding trials and required continue Foley catheterization.  His Foley catheter was replaced on 12/29/2020.  He is status post a TURP by Dr. Jeffie Pollock in 2016.  He is currently on tamsulosin.  He was treated for a UTI.  Urine culture grew Streptococcus. His catheter has been draining well.  He is not currently on antibiotics.  He reports no problems with his bowel movements.  No gross hematuria.  Past Medical History:  Past Medical History:  Diagnosis Date   Arthritis    BPH (benign prostatic hyperplasia)    CKD (chronic kidney disease), stage II    Complicated UTI (urinary tract infection) 03/2014   Depression    GERD (gastroesophageal reflux disease)    History of pneumonia    History of stroke    HOH (hard of hearing)    Hypertension    Hyponatremia    Lower GI bleed 2017   a. ? due to polyp.   NSVT (nonsustained ventricular tachycardia)    Rheumatic fever    Childhood   Sleep apnea    Does not use CPAP   Type 2 diabetes mellitus ([place])     Past Surgical History:  Past Surgical History:  Procedure Laterality Date   CARPAL TUNNEL  RELEASE Left 2010   CARPAL TUNNEL RELEASE Right 07/13/2017   Procedure: CARPAL TUNNEL RELEASE;  Surgeon: Carole Civil, MD;  Location: [facility];  Service: Orthopedics;  Laterality: Right;   CERVICAL SPINE SURGERY  2010   COLONOSCOPY WITH PROPOFOL N/A 11/30/2015   Procedure: COLONOSCOPY WITH PROPOFOL;  Surgeon: Daneil Dolin, MD;  [facility];  Service: Endoscopy;  Laterality: N/A;   KNEE ARTHROSCOPY  [place] surgery - broken nose     POLYPECTOMY  11/30/2015   Procedure: POLYPECTOMY;  Surgeon: Daneil Dolin, MD;  [facility];  Service: Endoscopy;;  polyp at ascending colon, rectal polyp   TRANSURETHRAL INCISION OF PROSTATE N/A 01/20/2015   Procedure: TRANSURETHRAL INCISION OF THE PROSTATE (TUIP);  Surgeon: Irine Seal, MD;  Location: [facility];  Service: Urology;  Laterality: N/A;    Allergies:  No Known Allergies  Family History:  Family History  Problem Relation Age of Onset   Heart disease Sister    Hypertension Sister     Social History:  Social History   Tobacco Use   Smoking status: Former    Packs/day: 1.50    Years: 14.00    Pack years: 21.00    Types: Cigarettes    Quit date: 08/06/1975  Years since quitting: 45.4   Smokeless tobacco: Never  Vaping Use   Vaping Use: Never used  Substance Use Topics   Alcohol use: Yes    Alcohol/week: 3.0 standard drinks    Types: 3 Cans of beer per week    Comment: 3 12 oz cans of beer each week.None since 03/2014   Drug use: No    Review of symptoms:  Constitutional:  Negative for unexplained weight loss, night sweats, fever, chills ENT:  Negative for nose bleeds, sinus pain, painful swallowing CV:  Negative for chest pain, shortness of breath, exercise intolerance, palpitations, loss of consciousness Resp:  Negative for cough, wheezing, shortness of breath GI:  Negative for nausea, vomiting, diarrhea, bloody stools GU:  Positives noted in HPI; otherwise negative for  gross hematuria, dysuria, urinary incontinence Neuro:  Negative for seizures, poor balance, limb weakness, slurred speech Psych:  Negative for lack of energy, depression, anxiety Endocrine:  Negative for polydipsia, polyuria, symptoms of hypoglycemia (dizziness, hunger, sweating) Hematologic:  Negative for anemia, purpura, petechia, prolonged or excessive bleeding, use of anticoagulants  Allergic:  Negative for difficulty breathing or choking as a result of exposure to anything; no shellfish allergy; no allergic response (rash/itch) to materials, foods  Physical exam: BP 122/68   Pulse (!) 52   Temp 98.6 F (37 C)   Wt 168 lb (76.2 kg)   BMI 23.43 kg/m  GENERAL APPEARANCE:  Well appearing, well developed, well nourished, NAD HEENT: Atraumatic, Normocephalic, oropharynx clear. NECK: Supple without lymphadenopathy or thyromegaly. LUNGS: Clear to auscultation bilaterally. HEART: Regular Rate and Rhythm without murmurs, gallops, or rubs. ABDOMEN: Soft, non-tender, No Masses. EXTREMITIES: Moves all extremities well.  Without clubbing, cyanosis, or edema. NEUROLOGIC:  Alert and oriented x 3, normal gait, CN II-XII grossly intact.  MENTAL STATUS:  Appropriate. BACK:  Non-tender to palpation.  No CVAT SKIN:  Warm, dry and intact.   GU: Penis:  uncircumcised Meatus: foley in place draining clear urine Scrotum: normal, no masses Testis: normal without masses bilateral Epididymis: normal   Results: No specimen obtained.  Voiding trial: Volume instilled:  520 ml Volume voided:  520 ml

## 2021-01-14 NOTE — Progress Notes (Signed)
Fill and Pull Catheter Removal  Patient is present today for a catheter removal.  Patient was cleaned and prepped in a sterile fashion 574ml of sterile water/ saline was instilled into the bladder when the patient felt the urge to urinate. 78ml of water was then drained from the balloon.  A 16FR foley cath was removed from the bladder no complications were noted .  Patient as then given some time to void on their own.  Patient can void  564ml on their own after some time.  Patient tolerated well.  Performed by: Kristin Barcus LPN  Follow up/ Additional notes: Per MD note

## 2021-01-22 DIAGNOSIS — D518 Other vitamin B12 deficiency anemias: Secondary | ICD-10-CM | POA: Diagnosis not present

## 2021-01-22 DIAGNOSIS — E038 Other specified hypothyroidism: Secondary | ICD-10-CM | POA: Diagnosis not present

## 2021-01-22 DIAGNOSIS — E7849 Other hyperlipidemia: Secondary | ICD-10-CM | POA: Diagnosis not present

## 2021-01-22 DIAGNOSIS — E119 Type 2 diabetes mellitus without complications: Secondary | ICD-10-CM | POA: Diagnosis not present

## 2021-01-22 DIAGNOSIS — Z79899 Other long term (current) drug therapy: Secondary | ICD-10-CM | POA: Diagnosis not present

## 2021-01-26 ENCOUNTER — Encounter: Payer: Self-pay | Admitting: Urology

## 2021-01-26 ENCOUNTER — Ambulatory Visit (INDEPENDENT_AMBULATORY_CARE_PROVIDER_SITE_OTHER): Payer: Medicare Other | Admitting: Urology

## 2021-01-26 ENCOUNTER — Other Ambulatory Visit: Payer: Self-pay

## 2021-01-26 VITALS — BP 131/61 | HR 64

## 2021-01-26 DIAGNOSIS — Z87440 Personal history of urinary (tract) infections: Secondary | ICD-10-CM | POA: Diagnosis not present

## 2021-01-26 DIAGNOSIS — R339 Retention of urine, unspecified: Secondary | ICD-10-CM

## 2021-01-26 DIAGNOSIS — N138 Other obstructive and reflux uropathy: Secondary | ICD-10-CM | POA: Diagnosis not present

## 2021-01-26 DIAGNOSIS — N401 Enlarged prostate with lower urinary tract symptoms: Secondary | ICD-10-CM

## 2021-01-26 LAB — BLADDER SCAN AMB NON-IMAGING: Scan Result: 315

## 2021-01-26 NOTE — Progress Notes (Signed)

## 2021-01-26 NOTE — Progress Notes (Signed)
Assessment: 1. BPH with obstruction/lower urinary tract symptoms   2. History of UTI   3. Urinary retention     Plan: Continue tamsulosin Continue timed and double voiding Return to office in 6-8 weeks  Chief Complaint: Chief Complaint  Patient presents with   Urinary Retention     HPI: Albert Garrison is a 75 y.o. male who presents for continued evaluation of BPH with obstruction and urinary retention. He had a catheter placed during a hospitalization in early September 2022.  He apparently failed voiding trials and required continue Foley catheterization.  His Foley catheter was replaced on 12/29/2020.  He is status post a TURP by Dr. Jeffie Pollock in 2016.  He has been on tamsulosin.  He was treated for a UTI.  Urine culture grew Streptococcus. His foley was removed on 01/14/21 after successful voiding trial.  He was continued on tamsulosin and treated with Augmentin.  He returns today for follow-up.  He continues on tamsulosin.  He reports that he is voiding spontaneously.  He feels like he is emptying his bladder well.  No dysuria or gross hematuria.  Portions of the above documentation were copied from a prior visit for review purposes only.  Allergies: No Known Allergies  PMH: Past Medical History:  Diagnosis Date   Arthritis    BPH (benign prostatic hyperplasia)    CKD (chronic kidney disease), stage II    Complicated UTI (urinary tract infection) 03/2014   Depression    GERD (gastroesophageal reflux disease)    History of pneumonia    History of stroke    HOH (hard of hearing)    Hypertension    Hyponatremia    Lower GI bleed 2017   a. ? due to polyp.   NSVT (nonsustained ventricular tachycardia)    Rheumatic fever    Childhood   Sleep apnea    Does not use CPAP   Type 2 diabetes mellitus ([place])     PSH: Past Surgical History:  Procedure Laterality Date   CARPAL TUNNEL RELEASE Left 2010   CARPAL TUNNEL RELEASE Right 07/13/2017   Procedure: CARPAL TUNNEL  RELEASE;  Surgeon: Carole Civil, MD;  [facility]: [facility];  Service: Orthopedics;  Laterality: Right;   CERVICAL SPINE SURGERY  2010   COLONOSCOPY WITH PROPOFOL N/A 11/30/2015   Procedure: COLONOSCOPY WITH PROPOFOL;  Surgeon: Daneil Dolin, MD;  [facility];  Service: Endoscopy;  Laterality: N/A;   KNEE ARTHROSCOPY  [place] surgery - broken nose     POLYPECTOMY  11/30/2015   Procedure: POLYPECTOMY;  Surgeon: Daneil Dolin, MD;  [facility];  Service: Endoscopy;;  polyp at ascending colon, rectal polyp   TRANSURETHRAL INCISION OF PROSTATE N/A 01/20/2015   Procedure: TRANSURETHRAL INCISION OF THE PROSTATE (TUIP);  Surgeon: Irine Seal, MD;  [facility];  Service: Urology;  Laterality: N/A;    SH: Social History   Tobacco Use   Smoking status: Former    Packs/day: 1.50    Years: 14.00    Pack years: 21.00    Types: Cigarettes    Quit date: 08/06/1975    Years since quitting: 45.5   Smokeless tobacco: Never  Vaping Use   Vaping Use: Never used  Substance Use Topics   Alcohol use: Yes    Alcohol/week: 3.0 standard drinks    Types: 3 Cans of beer per week    Comment: 3 12 oz cans of  beer each week.None since 03/2014   Drug use: No    ROS: Constitutional:  Negative for fever, chills, weight loss CV: Negative for chest pain, previous MI, hypertension Respiratory:  Negative for shortness of breath, wheezing, sleep apnea, frequent cough GI:  Negative for nausea, vomiting, bloody stool, GERD  PE: BP 131/61   Pulse 64  GENERAL APPEARANCE:  Well appearing, well developed, well nourished, NAD HEENT:  Atraumatic, normocephalic, oropharynx clear NECK:  Supple without lymphadenopathy or thyromegaly ABDOMEN:  Soft, non-tender, no masses EXTREMITIES:  Moves all extremities well, without clubbing, cyanosis, or edema NEUROLOGIC:  Alert and oriented x 3, normal gait, CN II-XII grossly intact MENTAL STATUS:  appropriate BACK:   Non-tender to palpation, No CVAT SKIN:  Warm, dry, and intact   Results: U/A:  6-10 WBC, 3-10 RBC, few bacteria  Results for orders placed or performed in visit on 01/26/21 (from the past 24 hour(s))  BLADDER SCAN AMB NON-IMAGING   Collection Time: 01/26/21  2:10 PM  Result Value Ref Range   Scan Result 315

## 2021-01-27 DIAGNOSIS — I10 Essential (primary) hypertension: Secondary | ICD-10-CM | POA: Diagnosis not present

## 2021-01-27 LAB — URINALYSIS, ROUTINE W REFLEX MICROSCOPIC
Bilirubin, UA: NEGATIVE
Ketones, UA: NEGATIVE
Nitrite, UA: NEGATIVE
Protein,UA: NEGATIVE
Specific Gravity, UA: 1.015 (ref 1.005–1.030)
Urobilinogen, Ur: 0.2 mg/dL (ref 0.2–1.0)
pH, UA: 5.5 (ref 5.0–7.5)

## 2021-01-27 LAB — MICROSCOPIC EXAMINATION: Renal Epithel, UA: NONE SEEN /hpf

## 2021-02-11 DIAGNOSIS — E1100 Type 2 diabetes mellitus with hyperosmolarity without nonketotic hyperglycemic-hyperosmolar coma (NKHHC): Secondary | ICD-10-CM | POA: Diagnosis not present

## 2021-02-11 DIAGNOSIS — I10 Essential (primary) hypertension: Secondary | ICD-10-CM | POA: Diagnosis not present

## 2021-02-11 DIAGNOSIS — E785 Hyperlipidemia, unspecified: Secondary | ICD-10-CM | POA: Diagnosis not present

## 2021-02-11 DIAGNOSIS — I2510 Atherosclerotic heart disease of native coronary artery without angina pectoris: Secondary | ICD-10-CM | POA: Diagnosis not present

## 2021-02-11 DIAGNOSIS — I69391 Dysphagia following cerebral infarction: Secondary | ICD-10-CM | POA: Diagnosis not present

## 2021-02-21 ENCOUNTER — Encounter (HOSPITAL_COMMUNITY): Payer: Self-pay

## 2021-02-21 ENCOUNTER — Other Ambulatory Visit: Payer: Self-pay

## 2021-02-21 ENCOUNTER — Emergency Department (HOSPITAL_COMMUNITY): Payer: Medicare Other

## 2021-02-21 ENCOUNTER — Emergency Department (HOSPITAL_COMMUNITY)
Admission: EM | Admit: 2021-02-21 | Discharge: 2021-02-21 | Disposition: A | Discharge status: Home / Self Care | Payer: Medicare Other | Attending: Emergency Medicine | Admitting: Emergency Medicine

## 2021-02-21 DIAGNOSIS — Z7984 Long term (current) use of oral hypoglycemic drugs: Secondary | ICD-10-CM | POA: Diagnosis not present

## 2021-02-21 DIAGNOSIS — I129 Hypertensive chronic kidney disease with stage 1 through stage 4 chronic kidney disease, or unspecified chronic kidney disease: Secondary | ICD-10-CM | POA: Insufficient documentation

## 2021-02-21 DIAGNOSIS — Z7982 Long term (current) use of aspirin: Secondary | ICD-10-CM | POA: Diagnosis not present

## 2021-02-21 DIAGNOSIS — R404 Transient alteration of awareness: Secondary | ICD-10-CM | POA: Diagnosis not present

## 2021-02-21 DIAGNOSIS — N182 Chronic kidney disease, stage 2 (mild): Secondary | ICD-10-CM | POA: Diagnosis not present

## 2021-02-21 DIAGNOSIS — E1122 Type 2 diabetes mellitus with diabetic chronic kidney disease: Secondary | ICD-10-CM | POA: Insufficient documentation

## 2021-02-21 DIAGNOSIS — R519 Headache, unspecified: Secondary | ICD-10-CM | POA: Insufficient documentation

## 2021-02-21 DIAGNOSIS — I469 Cardiac arrest, cause unspecified: Secondary | ICD-10-CM | POA: Diagnosis not present

## 2021-02-21 DIAGNOSIS — R001 Bradycardia, unspecified: Secondary | ICD-10-CM | POA: Insufficient documentation

## 2021-02-21 DIAGNOSIS — Z87891 Personal history of nicotine dependence: Secondary | ICD-10-CM | POA: Insufficient documentation

## 2021-02-21 DIAGNOSIS — Z743 Need for continuous supervision: Secondary | ICD-10-CM | POA: Diagnosis not present

## 2021-02-21 DIAGNOSIS — G4489 Other headache syndrome: Secondary | ICD-10-CM | POA: Diagnosis not present

## 2021-02-21 DIAGNOSIS — F0390 Unspecified dementia without behavioral disturbance: Secondary | ICD-10-CM | POA: Diagnosis not present

## 2021-02-21 DIAGNOSIS — R42 Dizziness and giddiness: Secondary | ICD-10-CM | POA: Diagnosis not present

## 2021-02-21 DIAGNOSIS — Z794 Long term (current) use of insulin: Secondary | ICD-10-CM | POA: Diagnosis not present

## 2021-02-21 DIAGNOSIS — H579 Unspecified disorder of eye and adnexa: Secondary | ICD-10-CM | POA: Diagnosis not present

## 2021-02-21 DIAGNOSIS — Z79899 Other long term (current) drug therapy: Secondary | ICD-10-CM | POA: Diagnosis not present

## 2021-02-21 DIAGNOSIS — H539 Unspecified visual disturbance: Secondary | ICD-10-CM | POA: Diagnosis not present

## 2021-02-21 DIAGNOSIS — R2681 Unsteadiness on feet: Secondary | ICD-10-CM | POA: Diagnosis not present

## 2021-02-21 LAB — CBC WITH DIFFERENTIAL/PLATELET
Abs Immature Granulocytes: 0.01 10*3/uL (ref 0.00–0.07)
Basophils Absolute: 0.1 10*3/uL (ref 0.0–0.1)
Basophils Relative: 1 %
Eosinophils Absolute: 0.1 10*3/uL (ref 0.0–0.5)
Eosinophils Relative: 1 %
HCT: 39.9 % (ref 39.0–52.0)
Hemoglobin: 13 g/dL (ref 13.0–17.0)
Immature Granulocytes: 0 %
Lymphocytes Relative: 21 %
Lymphs Abs: 1.6 10*3/uL (ref 0.7–4.0)
MCH: 30.2 pg (ref 26.0–34.0)
MCHC: 32.6 g/dL (ref 30.0–36.0)
MCV: 92.6 fL (ref 80.0–100.0)
Monocytes Absolute: 0.5 10*3/uL (ref 0.1–1.0)
Monocytes Relative: 7 %
Neutro Abs: 5.1 10*3/uL (ref 1.7–7.7)
Neutrophils Relative %: 70 %
Platelets: 268 10*3/uL (ref 150–400)
RBC: 4.31 MIL/uL (ref 4.22–5.81)
RDW: 14.4 % (ref 11.5–15.5)
WBC: 7.3 10*3/uL (ref 4.0–10.5)
nRBC: 0 % (ref 0.0–0.2)

## 2021-02-21 LAB — COMPREHENSIVE METABOLIC PANEL
ALT: 14 U/L (ref 0–44)
AST: 17 U/L (ref 15–41)
Albumin: 3.6 g/dL (ref 3.5–5.0)
Alkaline Phosphatase: 66 U/L (ref 38–126)
Anion gap: 9 (ref 5–15)
BUN: 14 mg/dL (ref 8–23)
CO2: 25 mmol/L (ref 22–32)
Calcium: 8.8 mg/dL — ABNORMAL LOW (ref 8.9–10.3)
Chloride: 103 mmol/L (ref 98–111)
Creatinine, Ser: 1.24 mg/dL (ref 0.61–1.24)
GFR, Estimated: >60 mL/min (ref >60)
Glucose, Bld: 167 mg/dL — ABNORMAL HIGH (ref 70–99)
Potassium: 4.1 mmol/L (ref 3.5–5.1)
Sodium: 137 mmol/L (ref 135–145)
Total Bilirubin: 0.7 mg/dL (ref 0.3–1.2)
Total Protein: 6.4 g/dL — ABNORMAL LOW (ref 6.5–8.1)

## 2021-02-21 LAB — URINALYSIS, ROUTINE W REFLEX MICROSCOPIC
Bilirubin Urine: NEGATIVE
Glucose, UA: NEGATIVE mg/dL
Hgb urine dipstick: NEGATIVE
Ketones, ur: NEGATIVE mg/dL
Leukocytes,Ua: NEGATIVE
Nitrite: NEGATIVE
Protein, ur: NEGATIVE mg/dL
Specific Gravity, Urine: 1.006 (ref 1.005–1.030)
pH: 6 (ref 5.0–8.0)

## 2021-02-21 LAB — CBG MONITORING, ED: Glucose-Capillary: 139 mg/dL — ABNORMAL HIGH (ref 70–99)

## 2021-02-21 MED ORDER — MECLIZINE HCL 12.5 MG PO TABS
25.0000 mg | ORAL_TABLET | Freq: Once | ORAL | Status: AC
  Administered 2021-02-21: 25 mg via ORAL
  Filled 2021-02-21: qty 2

## 2021-02-21 NOTE — Discharge Instructions (Addendum)
Continue taking medicine at home as prescribed. CT head was negative, no signs of infection in his work-up today.  If the dizziness worsens, he has unilateral weakness, his speech changes please have him return to the ED for MRI.

## 2021-02-21 NOTE — Progress Notes (Signed)
Nurse attempted multiple times to call facility, but did not answer. Pt's sister, Benjamine Mola, feels comfortable taking pt back. Son, [name], is also aware that pt's sister is driving pt back to [facility].

## 2021-02-21 NOTE — ED Triage Notes (Signed)
Pt came from [facility] via [facility] for c/o headache and dizziness. Staff stated pt eyes were twitching.

## 2021-02-21 NOTE — Progress Notes (Signed)
Pt's sister notified pt's brother who is POA about discharge.

## 2021-02-21 NOTE — ED Provider Notes (Signed)
[facility] EMERGENCY DEPARTMENT Provider Note   CSN: [id number] Arrival date & time: 02/21/21  7782     History Chief Complaint  Patient presents with   Dizziness    Albert Garrison is a 75 y.o. male.   Dizziness Associated symptoms: headaches   Associated symptoms: no nausea and no vomiting    Patient with history of dementia, type 2 diabetes, NSVT, hypertension, hyponatremia, history of stroke, CKD presents with dizziness.  This happened acutely this morning, patient reports he was eating dinner and he felt like his table was spinning.  Denies any nausea or vomiting, felt confused after the fact.  Denies any history of anything like this happening, was brought here by our EMS from [place].  Patient's brother reports he visited the patient roughly for 5 days ago and he was at his baseline then.  Reports his baseline does have a little bit of slurred speech, does have a history of vertigo but does not take medication for it.  History of prior stroke, no deficits from it.  Up-to-date on medication, was hospitalized roughly 4 months ago for a bowel obstruction went into cardiac arrest but was resuscitated successfully.  Level 5 caveat applies secondary to dementia.  History is provided by chart review, EMS, nursing note, patient himself, the patient's brother.  I tried unsuccessfully to reach [place], will attempt later.  Past Medical History:  Diagnosis Date   Arthritis    BPH (benign prostatic hyperplasia)    CKD (chronic kidney disease), stage II    Complicated UTI (urinary tract infection) 03/2014   Depression    GERD (gastroesophageal reflux disease)    History of pneumonia    History of stroke    HOH (hard of hearing)    Hypertension    Hyponatremia    Lower GI bleed 2017   a. ? due to polyp.   NSVT (nonsustained ventricular tachycardia)    Rheumatic fever    Childhood   Sleep apnea    Does not use CPAP   Type 2 diabetes mellitus ([place])      Patient Active Problem List   Diagnosis Date Noted   History of UTI 01/14/2021   Bacteremia due to Gram-positive bacteria 01/11/2021   Acute lower UTI 12/25/2020   Dysphagia 12/22/2020   Acute hypoxemic respiratory failure ([place]) 11/30/2020   Shock circulatory ([place]) 11/30/2020   Cardiac arrest ([place]) 11/30/2020   Other specified cardiac arrhythmias--- sleep apnea- arrhythmias when he desaturates  11/30/2020   Endotracheally intubated 11/30/2020   SBO (small bowel obstruction) ([place]) 11/28/2020   History of stroke 08/24/2017   Altered mental status 08/23/2017   Sleep apnea 08/23/2017   Elevated troponin 08/23/2017   Abnormal CT of the head 08/23/2017   Hypertension 08/23/2017   Type 2 diabetes mellitus ([place]) 08/23/2017   Syncope 08/23/2017   S/P carpal tunnel release right 07/13/17 07/27/2017   AKI (acute kidney injury) ([place]) 11/29/2015   Lower GI bleed 11/28/2015   Rectal bleed 11/28/2015   Rectal bleeding 11/28/2015   BPH with obstruction/lower urinary tract symptoms 01/20/2015   Urinary retention 05/21/2014   ARF (acute renal failure) ([place]) 03/26/2014   Type 2 diabetes mellitus without complication, with long-term current use of insulin ([place]) 03/26/2014   UTI (lower urinary tract infection) 03/24/2014   Hyponatremia 03/24/2014   Acute encephalopathy 03/24/2014    Past Surgical History:  Procedure Laterality Date   CARPAL TUNNEL RELEASE Left 2010   CARPAL TUNNEL RELEASE Right  07/13/2017   Procedure: CARPAL TUNNEL RELEASE;  Surgeon: Carole Civil, MD;  Location: [facility];  Service: Orthopedics;  Laterality: Right;   CERVICAL SPINE SURGERY  2010   COLONOSCOPY WITH PROPOFOL N/A 11/30/2015   Procedure: COLONOSCOPY WITH PROPOFOL;  Surgeon: Daneil Dolin, MD;  [facility];  Service: Endoscopy;  Laterality: N/A;   KNEE ARTHROSCOPY  [place] surgery - broken nose     POLYPECTOMY  11/30/2015   Procedure: POLYPECTOMY;  Surgeon: Daneil Dolin, MD;  [facility];  Service: Endoscopy;;  polyp at ascending colon, rectal polyp   TRANSURETHRAL INCISION OF PROSTATE N/A 01/20/2015   Procedure: TRANSURETHRAL INCISION OF THE PROSTATE (TUIP);  Surgeon: Irine Seal, MD;  [facility];  Service: Urology;  Laterality: N/A;       Family History  Problem Relation Age of Onset   Heart disease Sister    Hypertension Sister     Social History   Tobacco Use   Smoking status: Former    Packs/day: 1.50    Years: 14.00    Pack years: 21.00    Types: Cigarettes    Quit date: 08/06/1975    Years since quitting: 45.5   Smokeless tobacco: Never  Vaping Use   Vaping Use: Never used  Substance Use Topics   Alcohol use: Not Currently    Alcohol/week: 3.0 standard drinks    Types: 3 Cans of beer per week    Comment: 3 12 oz cans of beer each week.None since 03/2014   Drug use: No    Home Medications Prior to Admission medications   Medication Sig Start Date End Date Taking? Authorizing Provider  amLODipine (NORVASC) 10 MG tablet Take 1 tablet (10 mg total) by mouth daily. 01/11/21   Orson Eva, MD  aspirin 81 MG chewable tablet Chew 1 tablet (81 mg total) by mouth daily. 01/11/21   Orson Eva, MD  atorvastatin (LIPITOR) 40 MG tablet Take 1 tablet (40 mg total) by mouth daily. 01/11/21   Orson Eva, MD  haloperidol (HALDOL) 0.5 MG tablet Take 1 tablet (0.5 mg total) by mouth 2 (two) times daily. 01/11/21 02/10/21  Orson Eva, MD  insulin aspart (NOVOLOG) 100 UNIT/ML injection Inject 0-15 Units into the skin 3 (three) times daily with meals. CBG < 70: Implement Hypoglycemia Standing Orders and refer to Hypoglycemia Standing Orders sidebar report  CBG 70 - 120: 0 units  CBG 121 - 150: 2 units  CBG 151 - 200: 3 units  CBG 201 - 250: 5 units  CBG 251 - 300: 8 units  CBG 301 - 350: 11 units  CBG 351 - 400: 15 units 01/11/21   Tat, Shanon Brow, MD  magnesium oxide (MAG-OX) 400 (240 Mg) MG tablet Take 1 tablet (400 mg total) by  mouth daily. 01/12/21   Orson Eva, MD  metFORMIN (GLUCOPHAGE) 500 MG tablet Take 1 tablet (500 mg total) by mouth daily with breakfast. 01/11/21   Tat, Shanon Brow, MD  metoprolol tartrate (LOPRESSOR) 25 MG tablet Take 0.5 tablets (12.5 mg total) by mouth 2 (two) times daily. 01/11/21   Orson Eva, MD  Multiple Vitamin (MULTIVITAMIN WITH MINERALS) TABS tablet Take 1 tablet by mouth daily. 01/11/21   Orson Eva, MD  tamsulosin (FLOMAX) 0.4 MG CAPS capsule Take 1 capsule (0.4 mg total) by mouth daily after supper. 01/11/21   Orson Eva, MD    Allergies    Patient has no  known allergies.  Review of Systems   Review of Systems  Unable to perform ROS: Dementia  Gastrointestinal:  Negative for abdominal pain, nausea and vomiting.  Neurological:  Positive for dizziness and headaches. Negative for numbness.  Psychiatric/Behavioral:  Negative for confusion.    Physical Exam Updated Vital Signs BP (!) 111/54   Pulse 60   Temp 97.9 F (36.6 C) (Oral)   Resp 17   Ht 5\' 7"  (1.702 m)   Wt 78.3 kg   SpO2 99%   BMI 27.04 kg/m   Physical Exam Vitals and nursing note reviewed.  Constitutional:      General: He is not in acute distress.    Appearance: He is well-developed.  HENT:     Head: Normocephalic and atraumatic.  Eyes:     Conjunctiva/sclera: Conjunctivae normal.  Cardiovascular:     Rate and Rhythm: Regular rhythm. Bradycardia present.     Heart sounds: No murmur heard.    Comments: Bradycardia, regular rhythm. Pulmonary:     Effort: Pulmonary effort is normal. No respiratory distress.     Breath sounds: Normal breath sounds.  Abdominal:     Palpations: Abdomen is soft.     Tenderness: There is no abdominal tenderness.  Musculoskeletal:        General: No swelling.     Cervical back: Neck supple.  Skin:    General: Skin is warm and dry.     Capillary Refill: Capillary refill takes less than 2 seconds.  Neurological:     Mental Status: He is alert.     Comments: No nystagmus.   Slight dysarthria, cranial nerves III through XII are grossly intact.  Grip strength is equal bilaterally, patient is able to raise both lower extremities.  Answers questions and follows commands appropriately, oriented x3  Psychiatric:        Mood and Affect: Mood normal.   ED Results / Procedures / Treatments   Labs (all labs ordered are listed, but only abnormal results are displayed) Labs Reviewed  COMPREHENSIVE METABOLIC PANEL  CBC WITH DIFFERENTIAL/PLATELET  URINALYSIS, ROUTINE W REFLEX MICROSCOPIC  CBG MONITORING, ED    EKG None  Radiology No results found.  Procedures Procedures   Medications Ordered in ED Medications  meclizine (ANTIVERT) tablet 25 mg (has no administration in time range)    ED Course  I have reviewed the triage vital signs and the nursing notes.  Pertinent labs & imaging results that were available during my care of the patient were reviewed by me and considered in my medical decision making (see chart for details).  Clinical Course as of 02/21/21 1043  Sun Feb 21, 2021  1008 Unsuccessful attempt at reaching [facility] - rang indefinitely for 5 minutes. Will try again later. [id number] [HS]  8502 2nd attempt unsuccessful  [HS]    Clinical Course User Index [HS] Sherrill Raring, PA-C   MDM Rules/Calculators/A&P                           Stable vitals, patient is nontoxic-appearing.  Challenging onset of symptoms, its not clear based on his history of dementia if he woke up this way or if it started acutely during breakfast.  Unable to reach nursing home directly despite multiple attempts.  He does not have any neurodeficits on exam although he does have some slight dysarthria which his brother confirms it is baseline for him.  Do not think patient is a  code stroke.  We will proceed with metabolic work-up and get CT imaging of his head and proceed from there.  Labs: CBC: No leukocytosis, no anemia CMP: No hyponatremia, no gross  electrolyte derangement, no AKI.  Calcium slightly low but at baseline per chart review. CBG: No hypoglycemia UA:  CT: Negative for acute pathology.  Patient has nonspecific weakness, no metabolic derangement.  No evidence of acute stroke on work-up.  Overall, suspect patient is appropriate for discharge home.  Discussed red flag symptoms with care team, given on discharge instructions.  Patient discharged in stable condition.  Discussed HPI, physical exam and plan of care for this patient with attending Lavenia Atlas. The attending physician evaluated this patient as part of a shared visit and agrees with plan of care.   Final Clinical Impression(s) / ED Diagnoses Final diagnoses:  None    Rx / DC Orders ED Discharge Orders     None        Sherrill Raring, PA-C 02/21/21 1815    Lorelle Gibbs, DO 02/22/21 1013

## 2021-03-02 DIAGNOSIS — I10 Essential (primary) hypertension: Secondary | ICD-10-CM | POA: Diagnosis not present

## 2021-03-04 DIAGNOSIS — M79676 Pain in unspecified toe(s): Secondary | ICD-10-CM | POA: Diagnosis not present

## 2021-03-04 DIAGNOSIS — B351 Tinea unguium: Secondary | ICD-10-CM | POA: Diagnosis not present

## 2021-03-04 DIAGNOSIS — E1151 Type 2 diabetes mellitus with diabetic peripheral angiopathy without gangrene: Secondary | ICD-10-CM | POA: Diagnosis not present

## 2021-03-16 ENCOUNTER — Other Ambulatory Visit: Payer: Self-pay

## 2021-03-16 ENCOUNTER — Encounter: Payer: Self-pay | Admitting: Urology

## 2021-03-16 ENCOUNTER — Ambulatory Visit (INDEPENDENT_AMBULATORY_CARE_PROVIDER_SITE_OTHER): Payer: Medicare Other | Admitting: Urology

## 2021-03-16 VITALS — BP 122/58 | HR 67

## 2021-03-16 DIAGNOSIS — Z87440 Personal history of urinary (tract) infections: Secondary | ICD-10-CM | POA: Diagnosis not present

## 2021-03-16 DIAGNOSIS — Z87898 Personal history of other specified conditions: Secondary | ICD-10-CM

## 2021-03-16 DIAGNOSIS — N401 Enlarged prostate with lower urinary tract symptoms: Secondary | ICD-10-CM | POA: Diagnosis not present

## 2021-03-16 DIAGNOSIS — N138 Other obstructive and reflux uropathy: Secondary | ICD-10-CM

## 2021-03-16 LAB — URINALYSIS, ROUTINE W REFLEX MICROSCOPIC
Bilirubin, UA: NEGATIVE
Glucose, UA: NEGATIVE
Ketones, UA: NEGATIVE
Leukocytes,UA: NEGATIVE
Nitrite, UA: NEGATIVE
Protein,UA: NEGATIVE
RBC, UA: NEGATIVE
Specific Gravity, UA: 1.01 (ref 1.005–1.030)
Urobilinogen, Ur: 0.2 mg/dL (ref 0.2–1.0)
pH, UA: 5.5 (ref 5.0–7.5)

## 2021-03-16 NOTE — Progress Notes (Signed)
Assessment: 1. BPH with obstruction/lower urinary tract symptoms   2. History of UTI   3. History of urinary retention      Plan: Continue tamsulosin Continue timed and double voiding Return to office in 3-4 months  Chief Complaint: Chief Complaint  Patient presents with   Benign Prostatic Hypertrophy    HPI: Albert Garrison is a 75 y.o. male who presents for continued evaluation of BPH with obstruction and urinary retention. He had a catheter placed during a hospitalization in early September 2022.  He apparently failed voiding trials and required continue Foley catheterization.  His Foley catheter was replaced on 12/29/2020.  He is status post a TURP by Dr. Jeffie Pollock in 2016.  He has been on tamsulosin.  He was treated for a UTI.  Urine culture grew Streptococcus. His foley was removed on 01/14/21 after successful voiding trial.  He was continued on tamsulosin and treated with Augmentin.  At his visit in 10/22, he continued on tamsulosin.  He was voiding spontaneously and felt like he was emptying his bladder well.  No dysuria or gross hematuria. PVR = 315 ml  03/16/21  Pt presents for recheck and is doing well. No issues with tamsulosis-tolerating well. No issues with voiding. Nocturia 2-3 times/night and states okay with that.  UA clear today  Portions of the above documentation were copied from a prior visit for review purposes only.  Allergies: No Known Allergies  PMH: Past Medical History:  Diagnosis Date   Arthritis    BPH (benign prostatic hyperplasia)    CKD (chronic kidney disease), stage II    Complicated UTI (urinary tract infection) 03/2014   Depression    GERD (gastroesophageal reflux disease)    History of pneumonia    History of stroke    HOH (hard of hearing)    Hypertension    Hyponatremia    Lower GI bleed 2017   a. ? due to polyp.   NSVT (nonsustained ventricular tachycardia)    Rheumatic fever    Childhood   Sleep apnea    Does not use CPAP    Type 2 diabetes mellitus ([place])     PSH: Past Surgical History:  Procedure Laterality Date   CARPAL TUNNEL RELEASE Left 2010   CARPAL TUNNEL RELEASE Right 07/13/2017   Procedure: CARPAL TUNNEL RELEASE;  Surgeon: Carole Civil, MD;  [facility]: [facility];  Service: Orthopedics;  Laterality: Right;   CERVICAL SPINE SURGERY  2010   COLONOSCOPY WITH PROPOFOL N/A 11/30/2015   Procedure: COLONOSCOPY WITH PROPOFOL;  Surgeon: Daneil Dolin, MD;  [facility];  Service: Endoscopy;  Laterality: N/A;   KNEE ARTHROSCOPY  [place] surgery - broken nose     POLYPECTOMY  11/30/2015   Procedure: POLYPECTOMY;  Surgeon: Daneil Dolin, MD;  [facility];  Service: Endoscopy;;  polyp at ascending colon, rectal polyp   TRANSURETHRAL INCISION OF PROSTATE N/A 01/20/2015   Procedure: TRANSURETHRAL INCISION OF THE PROSTATE (TUIP);  Surgeon: Irine Seal, MD;  Location: [facility];  Service: Urology;  Laterality: N/A;    SH: Social History   Tobacco Use   Smoking status: Former    Packs/day: 1.50    Years: 14.00    Pack years: 21.00    Types: Cigarettes    Quit date: 08/06/1975    Years since quitting: 45.6   Smokeless tobacco: Never  Vaping Use   Vaping Use: Never used  Substance Use Topics   Alcohol use: Not Currently    Alcohol/week: 3.0 standard drinks    Types: 3 Cans of beer per week    Comment: 3 12 oz cans of beer each week.None since 03/2014   Drug use: No    ROS: Constitutional:  Negative for fever, chills, weight loss CV: Negative for chest pain, previous MI, hypertension Respiratory:  Negative for shortness of breath, wheezing, sleep apnea, frequent cough GI:  Negative for nausea, vomiting, bloody stool, GERD  PE: BP (!) 122/58   Pulse 67  GENERAL APPEARANCE:  Well appearing, well developed, well nourished, NAD HEENT:  Atraumatic, normocephalic NECK:  Supple  ABDOMEN: Non distended EXTREMITIES:  Moves all extremities well,  without clubbing, cyanosis, or edema NEUROLOGIC:  Alert and appropriate, normal gait MENTAL STATUS:  appropriate SKIN:  Warm, dry, and intact   Results: U/A dipstick negative

## 2021-03-16 NOTE — Patient Instructions (Signed)
FU in 3-4 months, UA and PVR

## 2021-03-16 NOTE — Progress Notes (Signed)
Urological Symptom Review  Patient is experiencing the following symptoms: Get up at night to urinate   Review of Systems  Gastrointestinal (upper)  : Negative for upper GI symptoms  Gastrointestinal (lower) : Negative for lower GI symptoms  Constitutional : Weight loss  Skin: Negative for skin symptoms  Eyes: Negative for eye symptoms  Ear/Nose/Throat : Negative for Ear/Nose/Throat symptoms  Hematologic/Lymphatic: Negative for Hematologic/Lymphatic symptoms  Cardiovascular : Negative for cardiovascular symptoms  Respiratory : Negative for respiratory symptoms  Endocrine: Negative for endocrine symptoms  Musculoskeletal: Negative for musculoskeletal symptoms  Neurological: Negative for neurological symptoms  Psychologic: Negative for psychiatric symptoms

## 2021-03-30 DIAGNOSIS — R5381 Other malaise: Secondary | ICD-10-CM | POA: Diagnosis not present

## 2021-03-30 DIAGNOSIS — R059 Cough, unspecified: Secondary | ICD-10-CM | POA: Diagnosis not present

## 2021-03-31 DIAGNOSIS — R059 Cough, unspecified: Secondary | ICD-10-CM | POA: Diagnosis not present

## 2021-04-01 DIAGNOSIS — E1100 Type 2 diabetes mellitus with hyperosmolarity without nonketotic hyperglycemic-hyperosmolar coma (NKHHC): Secondary | ICD-10-CM | POA: Diagnosis not present

## 2021-04-01 DIAGNOSIS — E785 Hyperlipidemia, unspecified: Secondary | ICD-10-CM | POA: Diagnosis not present

## 2021-04-01 DIAGNOSIS — I129 Hypertensive chronic kidney disease with stage 1 through stage 4 chronic kidney disease, or unspecified chronic kidney disease: Secondary | ICD-10-CM | POA: Diagnosis not present

## 2021-04-01 DIAGNOSIS — I2510 Atherosclerotic heart disease of native coronary artery without angina pectoris: Secondary | ICD-10-CM | POA: Diagnosis not present

## 2021-04-01 DIAGNOSIS — R2681 Unsteadiness on feet: Secondary | ICD-10-CM | POA: Diagnosis not present

## 2021-04-01 DIAGNOSIS — G9341 Metabolic encephalopathy: Secondary | ICD-10-CM | POA: Diagnosis not present

## 2021-04-01 DIAGNOSIS — R338 Other retention of urine: Secondary | ICD-10-CM | POA: Diagnosis not present

## 2021-04-08 DIAGNOSIS — E1100 Type 2 diabetes mellitus with hyperosmolarity without nonketotic hyperglycemic-hyperosmolar coma (NKHHC): Secondary | ICD-10-CM | POA: Diagnosis not present

## 2021-04-08 DIAGNOSIS — E119 Type 2 diabetes mellitus without complications: Secondary | ICD-10-CM | POA: Diagnosis not present

## 2021-04-08 DIAGNOSIS — E1165 Type 2 diabetes mellitus with hyperglycemia: Secondary | ICD-10-CM | POA: Diagnosis not present

## 2021-04-08 DIAGNOSIS — I2510 Atherosclerotic heart disease of native coronary artery without angina pectoris: Secondary | ICD-10-CM | POA: Diagnosis not present

## 2021-04-08 DIAGNOSIS — I10 Essential (primary) hypertension: Secondary | ICD-10-CM | POA: Diagnosis not present

## 2021-04-08 DIAGNOSIS — D518 Other vitamin B12 deficiency anemias: Secondary | ICD-10-CM | POA: Diagnosis not present

## 2021-04-08 DIAGNOSIS — E559 Vitamin D deficiency, unspecified: Secondary | ICD-10-CM | POA: Diagnosis not present

## 2021-04-08 DIAGNOSIS — E785 Hyperlipidemia, unspecified: Secondary | ICD-10-CM | POA: Diagnosis not present

## 2021-04-08 DIAGNOSIS — I129 Hypertensive chronic kidney disease with stage 1 through stage 4 chronic kidney disease, or unspecified chronic kidney disease: Secondary | ICD-10-CM | POA: Diagnosis not present

## 2021-04-08 DIAGNOSIS — E038 Other specified hypothyroidism: Secondary | ICD-10-CM | POA: Diagnosis not present

## 2021-04-29 DIAGNOSIS — R42 Dizziness and giddiness: Secondary | ICD-10-CM | POA: Diagnosis not present

## 2021-04-29 DIAGNOSIS — R2681 Unsteadiness on feet: Secondary | ICD-10-CM | POA: Diagnosis not present

## 2021-04-29 DIAGNOSIS — E1165 Type 2 diabetes mellitus with hyperglycemia: Secondary | ICD-10-CM | POA: Diagnosis not present

## 2021-04-29 DIAGNOSIS — R338 Other retention of urine: Secondary | ICD-10-CM | POA: Diagnosis not present

## 2021-04-29 DIAGNOSIS — I129 Hypertensive chronic kidney disease with stage 1 through stage 4 chronic kidney disease, or unspecified chronic kidney disease: Secondary | ICD-10-CM | POA: Diagnosis not present

## 2021-04-29 DIAGNOSIS — E785 Hyperlipidemia, unspecified: Secondary | ICD-10-CM | POA: Diagnosis not present

## 2021-04-29 DIAGNOSIS — E1100 Type 2 diabetes mellitus with hyperosmolarity without nonketotic hyperglycemic-hyperosmolar coma (NKHHC): Secondary | ICD-10-CM | POA: Diagnosis not present

## 2021-04-29 DIAGNOSIS — I2510 Atherosclerotic heart disease of native coronary artery without angina pectoris: Secondary | ICD-10-CM | POA: Diagnosis not present

## 2021-04-30 DIAGNOSIS — I10 Essential (primary) hypertension: Secondary | ICD-10-CM | POA: Diagnosis not present

## 2021-05-06 DIAGNOSIS — E119 Type 2 diabetes mellitus without complications: Secondary | ICD-10-CM | POA: Diagnosis not present

## 2021-05-06 DIAGNOSIS — E7849 Other hyperlipidemia: Secondary | ICD-10-CM | POA: Diagnosis not present

## 2021-05-06 DIAGNOSIS — Z79899 Other long term (current) drug therapy: Secondary | ICD-10-CM | POA: Diagnosis not present

## 2021-05-06 DIAGNOSIS — E559 Vitamin D deficiency, unspecified: Secondary | ICD-10-CM | POA: Diagnosis not present

## 2021-05-06 DIAGNOSIS — D518 Other vitamin B12 deficiency anemias: Secondary | ICD-10-CM | POA: Diagnosis not present

## 2021-05-08 DIAGNOSIS — I129 Hypertensive chronic kidney disease with stage 1 through stage 4 chronic kidney disease, or unspecified chronic kidney disease: Secondary | ICD-10-CM | POA: Diagnosis not present

## 2021-05-08 DIAGNOSIS — E1100 Type 2 diabetes mellitus with hyperosmolarity without nonketotic hyperglycemic-hyperosmolar coma (NKHHC): Secondary | ICD-10-CM | POA: Diagnosis not present

## 2021-05-08 DIAGNOSIS — E119 Type 2 diabetes mellitus without complications: Secondary | ICD-10-CM | POA: Diagnosis not present

## 2021-05-08 DIAGNOSIS — D518 Other vitamin B12 deficiency anemias: Secondary | ICD-10-CM | POA: Diagnosis not present

## 2021-05-08 DIAGNOSIS — E559 Vitamin D deficiency, unspecified: Secondary | ICD-10-CM | POA: Diagnosis not present

## 2021-05-08 DIAGNOSIS — E1165 Type 2 diabetes mellitus with hyperglycemia: Secondary | ICD-10-CM | POA: Diagnosis not present

## 2021-05-08 DIAGNOSIS — E785 Hyperlipidemia, unspecified: Secondary | ICD-10-CM | POA: Diagnosis not present

## 2021-05-08 DIAGNOSIS — E038 Other specified hypothyroidism: Secondary | ICD-10-CM | POA: Diagnosis not present

## 2021-05-08 DIAGNOSIS — I2510 Atherosclerotic heart disease of native coronary artery without angina pectoris: Secondary | ICD-10-CM | POA: Diagnosis not present

## 2021-05-08 DIAGNOSIS — I10 Essential (primary) hypertension: Secondary | ICD-10-CM | POA: Diagnosis not present

## 2021-05-31 DIAGNOSIS — I10 Essential (primary) hypertension: Secondary | ICD-10-CM | POA: Diagnosis not present

## 2021-06-01 DIAGNOSIS — I129 Hypertensive chronic kidney disease with stage 1 through stage 4 chronic kidney disease, or unspecified chronic kidney disease: Secondary | ICD-10-CM | POA: Diagnosis not present

## 2021-06-01 DIAGNOSIS — E785 Hyperlipidemia, unspecified: Secondary | ICD-10-CM | POA: Diagnosis not present

## 2021-06-01 DIAGNOSIS — I2510 Atherosclerotic heart disease of native coronary artery without angina pectoris: Secondary | ICD-10-CM | POA: Diagnosis not present

## 2021-06-01 DIAGNOSIS — E1100 Type 2 diabetes mellitus with hyperosmolarity without nonketotic hyperglycemic-hyperosmolar coma (NKHHC): Secondary | ICD-10-CM | POA: Diagnosis not present

## 2021-06-01 DIAGNOSIS — I69391 Dysphagia following cerebral infarction: Secondary | ICD-10-CM | POA: Diagnosis not present

## 2021-06-05 DIAGNOSIS — E119 Type 2 diabetes mellitus without complications: Secondary | ICD-10-CM | POA: Diagnosis not present

## 2021-06-05 DIAGNOSIS — I129 Hypertensive chronic kidney disease with stage 1 through stage 4 chronic kidney disease, or unspecified chronic kidney disease: Secondary | ICD-10-CM | POA: Diagnosis not present

## 2021-06-05 DIAGNOSIS — E785 Hyperlipidemia, unspecified: Secondary | ICD-10-CM | POA: Diagnosis not present

## 2021-06-05 DIAGNOSIS — I2510 Atherosclerotic heart disease of native coronary artery without angina pectoris: Secondary | ICD-10-CM | POA: Diagnosis not present

## 2021-06-05 DIAGNOSIS — E559 Vitamin D deficiency, unspecified: Secondary | ICD-10-CM | POA: Diagnosis not present

## 2021-06-05 DIAGNOSIS — E1100 Type 2 diabetes mellitus with hyperosmolarity without nonketotic hyperglycemic-hyperosmolar coma (NKHHC): Secondary | ICD-10-CM | POA: Diagnosis not present

## 2021-06-05 DIAGNOSIS — E038 Other specified hypothyroidism: Secondary | ICD-10-CM | POA: Diagnosis not present

## 2021-06-05 DIAGNOSIS — E1165 Type 2 diabetes mellitus with hyperglycemia: Secondary | ICD-10-CM | POA: Diagnosis not present

## 2021-06-05 DIAGNOSIS — I10 Essential (primary) hypertension: Secondary | ICD-10-CM | POA: Diagnosis not present

## 2021-06-05 DIAGNOSIS — D518 Other vitamin B12 deficiency anemias: Secondary | ICD-10-CM | POA: Diagnosis not present

## 2021-06-07 ENCOUNTER — Encounter: Payer: Self-pay | Admitting: Urology

## 2021-06-07 ENCOUNTER — Ambulatory Visit (INDEPENDENT_AMBULATORY_CARE_PROVIDER_SITE_OTHER): Payer: Medicare Other | Admitting: Urology

## 2021-06-07 ENCOUNTER — Other Ambulatory Visit: Payer: Self-pay

## 2021-06-07 VITALS — BP 116/67 | HR 56 | Ht 71.0 in | Wt 169.0 lb

## 2021-06-07 DIAGNOSIS — N401 Enlarged prostate with lower urinary tract symptoms: Secondary | ICD-10-CM | POA: Diagnosis not present

## 2021-06-07 DIAGNOSIS — N138 Other obstructive and reflux uropathy: Secondary | ICD-10-CM | POA: Diagnosis not present

## 2021-06-07 DIAGNOSIS — Z87440 Personal history of urinary (tract) infections: Secondary | ICD-10-CM

## 2021-06-07 DIAGNOSIS — Z87898 Personal history of other specified conditions: Secondary | ICD-10-CM

## 2021-06-07 LAB — URINALYSIS, DIPSTICK ONLY
Bilirubin, UA: NEGATIVE
Glucose, UA: NEGATIVE
Ketones, UA: NEGATIVE
Leukocytes,UA: NEGATIVE
Nitrite, UA: NEGATIVE
Protein,UA: NEGATIVE
RBC, UA: NEGATIVE
Specific Gravity, UA: 1.01 (ref 1.005–1.030)
Urobilinogen, Ur: 0.2 mg/dL (ref 0.2–1.0)
pH, UA: 6 (ref 5.0–7.5)

## 2021-06-07 NOTE — Progress Notes (Signed)
? ?Assessment: ?1. BPH with obstruction/lower urinary tract symptoms   ?2. History of UTI   ?3. History of urinary retention   ? ? ?Plan: ?Continue tamsulosin ?Continue timed and double voiding ?Return to office in 6 months ? ?Chief Complaint: ?Chief Complaint  ?Patient presents with  ? Benign Prostatic Hypertrophy  ? ? ?HPI: ?Albert Garrison is a 76 y.o. male who presents for continued evaluation of BPH with obstruction and urinary retention. ?He had a catheter placed during a hospitalization in early September 2022.  He apparently failed voiding trials and required continue Foley catheterization.  His Foley catheter was replaced on 12/29/2020.  He is status post a TURP by Dr. Jeffie Pollock in 2016.  He had been on tamsulosin.  He was treated for a UTI.  Urine culture grew Streptococcus. ?His foley was removed on 01/14/21 after successful voiding trial.  He was continued on tamsulosin and treated with Augmentin. ? ?At his visit in 10/22, he continued on tamsulosin.  He was voiding spontaneously and felt like he was emptying his bladder well.  No dysuria or gross hematuria. ?PVR = 315 ml ? ?At his visit in 12/22, he was doing well. No issues with tamsulosis-tolerating well. No issues with voiding. He reported nocturia 2-3 times/night. ? ?He returns today for follow-up.  He continues on tamsulosin 0.4 mg daily.  He reports good control of his urinary symptoms.  He is not having any trouble starting his stream.  He feels like he empties his bladder well.  No dysuria or gross hematuria.  No nocturia. ?IPSS = 0 today. ? ?Portions of the above documentation were copied from a prior visit for review purposes only. ? ?Allergies: ?No Known Allergies ? ?PMH: ?Past Medical History:  ?Diagnosis Date  ? Arthritis   ? BPH (benign prostatic hyperplasia)   ? CKD (chronic kidney disease), stage II   ? Complicated UTI (urinary tract infection) 03/2014  ? Depression   ? GERD (gastroesophageal reflux disease)   ? History of pneumonia   ?  History of stroke   ? HOH (hard of hearing)   ? Hypertension   ? Hyponatremia   ? Lower GI bleed 2017  ? a. ? due to polyp.  ? NSVT (nonsustained ventricular tachycardia)   ? Rheumatic fever   ? Childhood  ? Sleep apnea   ? Does not use CPAP  ? Type 2 diabetes mellitus ([place])   ? ? ?PSH: ?Past Surgical History:  ?Procedure Laterality Date  ? CARPAL TUNNEL RELEASE Left 2010  ? CARPAL TUNNEL RELEASE Right 07/13/2017  ? Procedure: CARPAL TUNNEL RELEASE;  Surgeon: Carole Civil, MD;  Location: [facility];  Service: Orthopedics;  Laterality: Right;  ? CERVICAL SPINE SURGERY  2010  ? COLONOSCOPY WITH PROPOFOL N/A 11/30/2015  ? Procedure: COLONOSCOPY WITH PROPOFOL;  Surgeon: Daneil Dolin, MD;  [facility];  Service: Endoscopy;  Laterality: N/A;  ? KNEE ARTHROSCOPY  1989  ? LUMBAR LAMINECTOMY  1986  ? Nose surgery - broken nose    ? POLYPECTOMY  11/30/2015  ? Procedure: POLYPECTOMY;  Surgeon: Daneil Dolin, MD;  [facility];  Service: Endoscopy;;  polyp at ascending colon, rectal polyp  ? TRANSURETHRAL INCISION OF PROSTATE N/A 01/20/2015  ? Procedure: TRANSURETHRAL INCISION OF THE PROSTATE (TUIP);  Surgeon: Irine Seal, MD;  [facility];  Service: Urology;  Laterality: N/A;  ? ? ?SH: ?Social History  ? ?Tobacco Use  ? Smoking status: Former  ?  Packs/day: 1.50  ?  Years: 14.00  ?  Pack years: 21.00  ?  Types: Cigarettes  ?  Quit date: 08/06/1975  ?  Years since quitting: 45.8  ? Smokeless tobacco: Never  ?Vaping Use  ? Vaping Use: Never used  ?Substance Use Topics  ? Alcohol use: Not Currently  ?  Alcohol/week: 3.0 standard drinks  ?  Types: 3 Cans of beer per week  ?  Comment: 3 12 oz cans of beer each week.None since 03/2014  ? Drug use: No  ? ? ?ROS: ?Constitutional:  Negative for fever, chills, weight loss ?CV: Negative for chest pain, previous MI, hypertension ?Respiratory:  Negative for shortness of breath, wheezing, sleep apnea, frequent cough ?GI:  Negative for nausea, vomiting, bloody  stool, GERD ? ?PE: ?BP 116/67   Pulse (!) 56   Ht '5\' 11"'$  (1.803 m)   Wt 169 lb (76.7 kg)   BMI 23.57 kg/m?  ?GENERAL APPEARANCE:  Well appearing, well developed, well nourished, NAD ?HEENT:  Atraumatic, normocephalic, oropharynx clear ?NECK:  Supple without lymphadenopathy or thyromegaly ?ABDOMEN:  Soft, non-tender, no masses ?EXTREMITIES:  Moves all extremities well, without clubbing, cyanosis, or edema ?NEUROLOGIC:  Alert and oriented x 3, normal gait, CN II-XII grossly intact ?MENTAL STATUS:  appropriate ?BACK:  Non-tender to palpation, No CVAT ?SKIN:  Warm, dry, and intact ?GU: ?Prostate: 40 gm, NT, no nodules ?Rectum: Normal tone,  no masses or tenderness ? ? ?Results: ?U/A dipstick negative ? ? ?

## 2021-06-28 DIAGNOSIS — I10 Essential (primary) hypertension: Secondary | ICD-10-CM | POA: Diagnosis not present

## 2021-06-29 DIAGNOSIS — E1100 Type 2 diabetes mellitus with hyperosmolarity without nonketotic hyperglycemic-hyperosmolar coma (NKHHC): Secondary | ICD-10-CM | POA: Diagnosis not present

## 2021-06-29 DIAGNOSIS — I129 Hypertensive chronic kidney disease with stage 1 through stage 4 chronic kidney disease, or unspecified chronic kidney disease: Secondary | ICD-10-CM | POA: Diagnosis not present

## 2021-06-29 DIAGNOSIS — I2510 Atherosclerotic heart disease of native coronary artery without angina pectoris: Secondary | ICD-10-CM | POA: Diagnosis not present

## 2021-06-29 DIAGNOSIS — R42 Dizziness and giddiness: Secondary | ICD-10-CM | POA: Diagnosis not present

## 2021-06-29 DIAGNOSIS — E785 Hyperlipidemia, unspecified: Secondary | ICD-10-CM | POA: Diagnosis not present

## 2021-07-08 DIAGNOSIS — I129 Hypertensive chronic kidney disease with stage 1 through stage 4 chronic kidney disease, or unspecified chronic kidney disease: Secondary | ICD-10-CM | POA: Diagnosis not present

## 2021-07-08 DIAGNOSIS — E1100 Type 2 diabetes mellitus with hyperosmolarity without nonketotic hyperglycemic-hyperosmolar coma (NKHHC): Secondary | ICD-10-CM | POA: Diagnosis not present

## 2021-07-08 DIAGNOSIS — E1165 Type 2 diabetes mellitus with hyperglycemia: Secondary | ICD-10-CM | POA: Diagnosis not present

## 2021-07-08 DIAGNOSIS — E785 Hyperlipidemia, unspecified: Secondary | ICD-10-CM | POA: Diagnosis not present

## 2021-07-08 DIAGNOSIS — D518 Other vitamin B12 deficiency anemias: Secondary | ICD-10-CM | POA: Diagnosis not present

## 2021-07-08 DIAGNOSIS — E038 Other specified hypothyroidism: Secondary | ICD-10-CM | POA: Diagnosis not present

## 2021-07-08 DIAGNOSIS — I10 Essential (primary) hypertension: Secondary | ICD-10-CM | POA: Diagnosis not present

## 2021-07-08 DIAGNOSIS — I2510 Atherosclerotic heart disease of native coronary artery without angina pectoris: Secondary | ICD-10-CM | POA: Diagnosis not present

## 2021-07-08 DIAGNOSIS — E559 Vitamin D deficiency, unspecified: Secondary | ICD-10-CM | POA: Diagnosis not present

## 2021-07-08 DIAGNOSIS — E119 Type 2 diabetes mellitus without complications: Secondary | ICD-10-CM | POA: Diagnosis not present

## 2021-07-19 DIAGNOSIS — E1140 Type 2 diabetes mellitus with diabetic neuropathy, unspecified: Secondary | ICD-10-CM | POA: Diagnosis not present

## 2021-07-19 DIAGNOSIS — M2041 Other hammer toe(s) (acquired), right foot: Secondary | ICD-10-CM | POA: Diagnosis not present

## 2021-07-19 DIAGNOSIS — M79671 Pain in right foot: Secondary | ICD-10-CM | POA: Diagnosis not present

## 2021-07-19 DIAGNOSIS — M2042 Other hammer toe(s) (acquired), left foot: Secondary | ICD-10-CM | POA: Diagnosis not present

## 2021-07-19 DIAGNOSIS — M79672 Pain in left foot: Secondary | ICD-10-CM | POA: Diagnosis not present

## 2021-07-19 DIAGNOSIS — L600 Ingrowing nail: Secondary | ICD-10-CM | POA: Diagnosis not present

## 2021-07-19 DIAGNOSIS — B351 Tinea unguium: Secondary | ICD-10-CM | POA: Diagnosis not present

## 2021-07-27 DIAGNOSIS — E1165 Type 2 diabetes mellitus with hyperglycemia: Secondary | ICD-10-CM | POA: Diagnosis not present

## 2021-07-27 DIAGNOSIS — E1100 Type 2 diabetes mellitus with hyperosmolarity without nonketotic hyperglycemic-hyperosmolar coma (NKHHC): Secondary | ICD-10-CM | POA: Diagnosis not present

## 2021-07-27 DIAGNOSIS — E785 Hyperlipidemia, unspecified: Secondary | ICD-10-CM | POA: Diagnosis not present

## 2021-07-27 DIAGNOSIS — I129 Hypertensive chronic kidney disease with stage 1 through stage 4 chronic kidney disease, or unspecified chronic kidney disease: Secondary | ICD-10-CM | POA: Diagnosis not present

## 2021-07-27 DIAGNOSIS — I2510 Atherosclerotic heart disease of native coronary artery without angina pectoris: Secondary | ICD-10-CM | POA: Diagnosis not present

## 2021-07-27 DIAGNOSIS — R339 Retention of urine, unspecified: Secondary | ICD-10-CM | POA: Diagnosis not present

## 2021-07-29 ENCOUNTER — Ambulatory Visit (INDEPENDENT_AMBULATORY_CARE_PROVIDER_SITE_OTHER): Payer: Medicare Other | Admitting: Urology

## 2021-07-29 ENCOUNTER — Encounter: Payer: Self-pay | Admitting: Urology

## 2021-07-29 VITALS — BP 123/68 | HR 44

## 2021-07-29 DIAGNOSIS — N401 Enlarged prostate with lower urinary tract symptoms: Secondary | ICD-10-CM

## 2021-07-29 DIAGNOSIS — R339 Retention of urine, unspecified: Secondary | ICD-10-CM | POA: Diagnosis not present

## 2021-07-29 DIAGNOSIS — N138 Other obstructive and reflux uropathy: Secondary | ICD-10-CM | POA: Diagnosis not present

## 2021-07-29 DIAGNOSIS — Z87440 Personal history of urinary (tract) infections: Secondary | ICD-10-CM | POA: Diagnosis not present

## 2021-07-29 DIAGNOSIS — Z87898 Personal history of other specified conditions: Secondary | ICD-10-CM

## 2021-07-29 DIAGNOSIS — R3914 Feeling of incomplete bladder emptying: Secondary | ICD-10-CM

## 2021-07-29 DIAGNOSIS — I10 Essential (primary) hypertension: Secondary | ICD-10-CM | POA: Diagnosis not present

## 2021-07-29 LAB — URINALYSIS, ROUTINE W REFLEX MICROSCOPIC
Bilirubin, UA: NEGATIVE
Glucose, UA: NEGATIVE
Ketones, UA: NEGATIVE
Leukocytes,UA: NEGATIVE
Nitrite, UA: NEGATIVE
Protein,UA: NEGATIVE
RBC, UA: NEGATIVE
Specific Gravity, UA: <1.005 — ABNORMAL LOW (ref 1.005–1.030)
Urobilinogen, Ur: 0.2 mg/dL (ref 0.2–1.0)
pH, UA: 5.5 (ref 5.0–7.5)

## 2021-07-29 LAB — BLADDER SCAN AMB NON-IMAGING: Scan Result: >545

## 2021-07-29 NOTE — Progress Notes (Signed)
? ?Assessment: ?1. BPH with obstruction/lower urinary tract symptoms   ?2. History of UTI   ?3. History of urinary retention   ?4. Incomplete bladder emptying   ? ? ?Plan: ?I discussed options for management of his lower urinary tract symptoms and incomplete emptying.  Given his postvoid residual, I recommended  Foley catheter placement to allow bladder decompression.  He refuses a Foley catheter at this time. ?Discontinue tamsulosin. ?Silodosin 8 mg p.o. daily. ?Return to office in 7-10 days with bladder scan. ? ?Chief Complaint: ?Chief Complaint  ?Patient presents with  ? Benign Prostatic Hypertrophy  ? ? ?HPI: ?Albert Garrison is a 76 y.o. male who presents for continued evaluation of BPH with obstruction and urinary retention. ?He had a catheter placed during a hospitalization in early September 2022.  He apparently failed voiding trials and required continue Foley catheterization.  His Foley catheter was replaced on 12/29/2020.  He is status post a TURP by Dr. Jeffie Pollock in 2016.  He had been on tamsulosin.  He was treated for a UTI.  Urine culture grew Streptococcus. ?His foley was removed on 01/14/21 after successful voiding trial.  He was continued on tamsulosin and treated with Augmentin. ? ?At his visit in 10/22, he continued on tamsulosin.  He was voiding spontaneously and felt like he was emptying his bladder well.  No dysuria or gross hematuria. ?PVR = 315 ml ? ?At his visit in 12/22, he was doing well. No issues with tamsulosis-tolerating well. No issues with voiding. He reported nocturia 2-3 times/night. ?At his visit on 06/07/2021, he continued on tamsulosin 0.4 mg daily with good control of his urinary symptoms.  He was not having any trouble starting his stream and felt  like he emptied his bladder well.  No dysuria or gross hematuria.  No nocturia. ?IPSS = 0. ? ?He returns today for follow-up.  He reports increased difficulty with voiding for the past 2 weeks.  He is having urgency, intermittency,  hesitancy and decreased force of stream.  No dysuria or gross hematuria. ?IPSS = 5 today. ? ?Portions of the above documentation were copied from a prior visit for review purposes only. ? ?Allergies: ?No Known Allergies ? ?PMH: ?Past Medical History:  ?Diagnosis Date  ? Arthritis   ? BPH (benign prostatic hyperplasia)   ? CKD (chronic kidney disease), stage II   ? Complicated UTI (urinary tract infection) 03/2014  ? Depression   ? GERD (gastroesophageal reflux disease)   ? History of pneumonia   ? History of stroke   ? HOH (hard of hearing)   ? Hypertension   ? Hyponatremia   ? Lower GI bleed 2017  ? a. ? due to polyp.  ? NSVT (nonsustained ventricular tachycardia)   ? Rheumatic fever   ? Childhood  ? Sleep apnea   ? Does not use CPAP  ? Type 2 diabetes mellitus ([place])   ? ? ?PSH: ?Past Surgical History:  ?Procedure Laterality Date  ? CARPAL TUNNEL RELEASE Left 2010  ? CARPAL TUNNEL RELEASE Right 07/13/2017  ? Procedure: CARPAL TUNNEL RELEASE;  Surgeon: Carole Civil, MD;  [facility]: [facility];  Service: Orthopedics;  Laterality: Right;  ? CERVICAL SPINE SURGERY  2010  ? COLONOSCOPY WITH PROPOFOL N/A 11/30/2015  ? Procedure: COLONOSCOPY WITH PROPOFOL;  Surgeon: Daneil Dolin, MD;  [facility];  Service: Endoscopy;  Laterality: N/A;  ? KNEE ARTHROSCOPY  1989  ? LUMBAR LAMINECTOMY  1986  ? Nose surgery - broken nose    ?  POLYPECTOMY  11/30/2015  ? Procedure: POLYPECTOMY;  Surgeon: Daneil Dolin, MD;  [facility];  Service: Endoscopy;;  polyp at ascending colon, rectal polyp  ? TRANSURETHRAL INCISION OF PROSTATE N/A 01/20/2015  ? Procedure: TRANSURETHRAL INCISION OF THE PROSTATE (TUIP);  Surgeon: Irine Seal, MD;  [facility];  Service: Urology;  Laterality: N/A;  ? ? ?SH: ?Social History  ? ?Tobacco Use  ? Smoking status: Former  ?  Packs/day: 1.50  ?  Years: 14.00  ?  Pack years: 21.00  ?  Types: Cigarettes  ?  Quit date: 08/06/1975  ?  Years since quitting: 46.0  ? Smokeless tobacco: Never   ?Vaping Use  ? Vaping Use: Never used  ?Substance Use Topics  ? Alcohol use: Not Currently  ?  Alcohol/week: 3.0 standard drinks  ?  Types: 3 Cans of beer per week  ?  Comment: 3 12 oz cans of beer each week.None since 03/2014  ? Drug use: No  ? ? ?ROS: ?Constitutional:  Negative for fever, chills, weight loss ?CV: Negative for chest pain, previous MI, hypertension ?Respiratory:  Negative for shortness of breath, wheezing, sleep apnea, frequent cough ?GI:  Negative for nausea, vomiting, bloody stool, GERD ? ?PE: ?BP 123/68   Pulse (!) 44  ?GENERAL APPEARANCE:  Well appearing, well developed, well nourished, NAD ?HEENT:  Atraumatic, normocephalic, oropharynx clear ?NECK:  Supple without lymphadenopathy or thyromegaly ?ABDOMEN:  Soft, non-tender, no masses ?EXTREMITIES:  Moves all extremities well, without clubbing, cyanosis, or edema ?NEUROLOGIC:  Alert and oriented x 3, normal gait, CN II-XII grossly intact ?MENTAL STATUS:  appropriate ?BACK:  Non-tender to palpation, No CVAT ?SKIN:  Warm, dry, and intact ?GU: ?Prostate: 40 g, NT, no nodules ?Rectum: Normal tone,  no masses or tenderness ? ?Results: ?U/A dipstick negative ? ?PVR:  >545 ml  ? ? ?

## 2021-08-05 DIAGNOSIS — D518 Other vitamin B12 deficiency anemias: Secondary | ICD-10-CM | POA: Diagnosis not present

## 2021-08-05 DIAGNOSIS — E7849 Other hyperlipidemia: Secondary | ICD-10-CM | POA: Diagnosis not present

## 2021-08-05 DIAGNOSIS — Z79899 Other long term (current) drug therapy: Secondary | ICD-10-CM | POA: Diagnosis not present

## 2021-08-05 DIAGNOSIS — E559 Vitamin D deficiency, unspecified: Secondary | ICD-10-CM | POA: Diagnosis not present

## 2021-08-05 DIAGNOSIS — E119 Type 2 diabetes mellitus without complications: Secondary | ICD-10-CM | POA: Diagnosis not present

## 2021-08-11 ENCOUNTER — Ambulatory Visit (INDEPENDENT_AMBULATORY_CARE_PROVIDER_SITE_OTHER): Payer: Medicare Other | Admitting: Urology

## 2021-08-11 ENCOUNTER — Encounter: Payer: Self-pay | Admitting: Urology

## 2021-08-11 VITALS — BP 131/73 | HR 49

## 2021-08-11 DIAGNOSIS — R339 Retention of urine, unspecified: Secondary | ICD-10-CM | POA: Diagnosis not present

## 2021-08-11 DIAGNOSIS — Z87898 Personal history of other specified conditions: Secondary | ICD-10-CM

## 2021-08-11 DIAGNOSIS — Z87440 Personal history of urinary (tract) infections: Secondary | ICD-10-CM | POA: Diagnosis not present

## 2021-08-11 DIAGNOSIS — N401 Enlarged prostate with lower urinary tract symptoms: Secondary | ICD-10-CM | POA: Diagnosis not present

## 2021-08-11 DIAGNOSIS — N138 Other obstructive and reflux uropathy: Secondary | ICD-10-CM

## 2021-08-11 LAB — URINALYSIS, ROUTINE W REFLEX MICROSCOPIC
Bilirubin, UA: NEGATIVE
Glucose, UA: NEGATIVE
Ketones, UA: NEGATIVE
Leukocytes,UA: NEGATIVE
Nitrite, UA: NEGATIVE
Protein,UA: NEGATIVE
RBC, UA: NEGATIVE
Specific Gravity, UA: 1.01 (ref 1.005–1.030)
Urobilinogen, Ur: 0.2 mg/dL (ref 0.2–1.0)
pH, UA: 6.5 (ref 5.0–7.5)

## 2021-08-11 LAB — BLADDER SCAN AMB NON-IMAGING: Scan Result: >541

## 2021-08-11 MED ORDER — CIPROFLOXACIN HCL 500 MG PO TABS
500.0000 mg | ORAL_TABLET | Freq: Once | ORAL | Status: AC
  Administered 2021-08-11: 500 mg via ORAL

## 2021-08-11 NOTE — Progress Notes (Signed)
? ?Assessment: ?1. BPH with obstruction/lower urinary tract symptoms   ?2. Incomplete bladder emptying   ?3. History of UTI   ?4. History of urinary retention   ? ? ?Plan: ?Cipro x1 following cystoscopy. ?I discussed the findings on cystoscopy with the patient.  There does appear to be some regrowth of the prostate tissue following his TURP in 2016.  This very well may be contributing to his incomplete bladder emptying and lower urinary tract symptoms.  However, given his other medical conditions including dementia, I think it be reasonable to evaluate his bladder function with urodynamics prior to making any recommendations for repeat surgical management. ?Continue silodosin 8 mg p.o. daily. ?Schedule for urodynamics at [facility] ?Return to office after urodynamics completed to discuss results. ? ?Chief Complaint: ?Chief Complaint  ?Patient presents with  ? Benign Prostatic Hypertrophy  ? ? ?HPI: ?Albert Garrison is a 76 y.o. male who presents for continued evaluation of BPH with obstruction and urinary retention. ?He had a catheter placed during a hospitalization in early September 2022.  He apparently failed voiding trials and required continue Foley catheterization.  His Foley catheter was replaced on 12/29/2020.  He is status post a TURP by Dr. Jeffie Pollock in 2016.  He had been on tamsulosin.  He was treated for a UTI.  Urine culture grew Streptococcus. ?His foley was removed on 01/14/21 after successful voiding trial.  He was continued on tamsulosin and treated with Augmentin. ? ?At his visit in 10/22, he continued on tamsulosin.  He was voiding spontaneously and felt like he was emptying his bladder well.  No dysuria or gross hematuria. ?PVR = 315 ml ? ?At his visit in 12/22, he was doing well. No issues with tamsulosis-tolerating well. No issues with voiding. He reported nocturia 2-3 times/night. ?At his visit on 06/07/2021, he continued on tamsulosin 0.4 mg daily with good control of his urinary symptoms.   He was not having any trouble starting his stream and felt  like he emptied his bladder well.  No dysuria or gross hematuria.  No nocturia. ?IPSS = 0. ? ?At his visit in 4/23, he reported increased difficulty with voiding for 2 weeks.  He was having urgency, intermittency, hesitancy and decreased force of stream.  No dysuria or gross hematuria. ?IPSS = 5.  PVR = >545 ml. ?Placement of a Foley catheter was recommended but the patient refused.  He was changed to silodosin 8 mg daily. ? ?He returns today for follow-up.  He is currently on silodosin.  He reports a slight improvement in his urinary symptoms.  He continues to have nocturia and frequency.  No dysuria or gross hematuria. ? ?Portions of the above documentation were copied from a prior visit for review purposes only. ? ?Allergies: ?No Known Allergies ? ?PMH: ?Past Medical History:  ?Diagnosis Date  ? Arthritis   ? BPH (benign prostatic hyperplasia)   ? CKD (chronic kidney disease), stage II   ? Complicated UTI (urinary tract infection) 03/2014  ? Depression   ? GERD (gastroesophageal reflux disease)   ? History of pneumonia   ? History of stroke   ? HOH (hard of hearing)   ? Hypertension   ? Hyponatremia   ? Lower GI bleed 2017  ? a. ? due to polyp.  ? NSVT (nonsustained ventricular tachycardia) ([place])   ? Rheumatic fever   ? Childhood  ? Sleep apnea   ? Does not use CPAP  ? Type 2 diabetes mellitus ([place])   ? ? ?  PSH: ?Past Surgical History:  ?Procedure Laterality Date  ? CARPAL TUNNEL RELEASE Left 2010  ? CARPAL TUNNEL RELEASE Right 07/13/2017  ? Procedure: CARPAL TUNNEL RELEASE;  Surgeon: Carole Civil, MD;  [facility]: [facility];  Service: Orthopedics;  Laterality: Right;  ? CERVICAL SPINE SURGERY  2010  ? COLONOSCOPY WITH PROPOFOL N/A 11/30/2015  ? Procedure: COLONOSCOPY WITH PROPOFOL;  Surgeon: Daneil Dolin, MD;  [facility];  Service: Endoscopy;  Laterality: N/A;  ? KNEE ARTHROSCOPY  1989  ? LUMBAR LAMINECTOMY  1986  ? Nose surgery - broken  nose    ? POLYPECTOMY  11/30/2015  ? Procedure: POLYPECTOMY;  Surgeon: Daneil Dolin, MD;  [facility];  Service: Endoscopy;;  polyp at ascending colon, rectal polyp  ? TRANSURETHRAL INCISION OF PROSTATE N/A 01/20/2015  ? Procedure: TRANSURETHRAL INCISION OF THE PROSTATE (TUIP);  Surgeon: Irine Seal, MD;  [facility];  Service: Urology;  Laterality: N/A;  ? ? ?SH: ?Social History  ? ?Tobacco Use  ? Smoking status: Former  ?  Packs/day: 1.50  ?  Years: 14.00  ?  Pack years: 21.00  ?  Types: Cigarettes  ?  Quit date: 08/06/1975  ?  Years since quitting: 46.0  ? Smokeless tobacco: Never  ?Vaping Use  ? Vaping Use: Never used  ?Substance Use Topics  ? Alcohol use: Not Currently  ?  Alcohol/week: 3.0 standard drinks  ?  Types: 3 Cans of beer per week  ?  Comment: 3 12 oz cans of beer each week.None since 03/2014  ? Drug use: No  ? ? ?ROS: ?Constitutional:  Negative for fever, chills, weight loss ?CV: Negative for chest pain, previous MI, hypertension ?Respiratory:  Negative for shortness of breath, wheezing, sleep apnea, frequent cough ?GI:  Negative for nausea, vomiting, bloody stool, GERD ? ?PE: ?BP 131/73   Pulse (!) 49  ?GENERAL APPEARANCE:  Well appearing, well developed, well nourished, NAD ?HEENT:  Atraumatic, normocephalic, oropharynx clear ?NECK:  Supple without lymphadenopathy or thyromegaly ?ABDOMEN:  Soft, non-tender, no masses ?EXTREMITIES:  Moves all extremities well, without clubbing, cyanosis, or edema ?NEUROLOGIC:  Alert and oriented x 3, normal gait, CN II-XII grossly intact ?MENTAL STATUS:  appropriate ?BACK:  Non-tender to palpation, No CVAT ?SKIN:  Warm, dry, and intact ? ?Results: ?U/A: dipstick negative ? ?PVR = >541 ml   ? ?Procedure:  Flexible Cystourethroscopy ? ?Pre-operative Diagnosis:  BPH with obstruction, incomplete bladder emptying ? ?Post-operative Diagnosis:  BPH with obstruction, incomplete bladder empyting ? ?Anesthesia:  local with lidocaine jelly ? ?Surgical  Narrative: ? ?After appropriate informed consent was obtained, the patient was prepped and draped in the usual sterile fashion in the supine position.  The patient was correctly identified and the proper procedure delineated prior to proceeding.  Sterile lidocaine gel was instilled in the urethra. ?The flexible cystoscope was introduced without difficulty. ? ?Findings: ? ?Anterior urethra: Normal ? ?Posterior urethra:  Post TURP changes, regrowth of adenoma at bladder neck, lateral lobe enlargement ? ?Bladder: Trabeculations ? ?Ureteral orifices: normal ? ?Additional findings: none ? ?Saline bladder wash for cytology was not performed.   ? ?The cystoscope was then removed.  The patient tolerated the procedure well. ? ? ? ?

## 2021-08-14 DIAGNOSIS — E1165 Type 2 diabetes mellitus with hyperglycemia: Secondary | ICD-10-CM | POA: Diagnosis not present

## 2021-08-14 DIAGNOSIS — E119 Type 2 diabetes mellitus without complications: Secondary | ICD-10-CM | POA: Diagnosis not present

## 2021-08-14 DIAGNOSIS — E559 Vitamin D deficiency, unspecified: Secondary | ICD-10-CM | POA: Diagnosis not present

## 2021-08-14 DIAGNOSIS — E038 Other specified hypothyroidism: Secondary | ICD-10-CM | POA: Diagnosis not present

## 2021-08-14 DIAGNOSIS — I129 Hypertensive chronic kidney disease with stage 1 through stage 4 chronic kidney disease, or unspecified chronic kidney disease: Secondary | ICD-10-CM | POA: Diagnosis not present

## 2021-08-14 DIAGNOSIS — I10 Essential (primary) hypertension: Secondary | ICD-10-CM | POA: Diagnosis not present

## 2021-08-14 DIAGNOSIS — I2510 Atherosclerotic heart disease of native coronary artery without angina pectoris: Secondary | ICD-10-CM | POA: Diagnosis not present

## 2021-08-14 DIAGNOSIS — E785 Hyperlipidemia, unspecified: Secondary | ICD-10-CM | POA: Diagnosis not present

## 2021-08-14 DIAGNOSIS — D518 Other vitamin B12 deficiency anemias: Secondary | ICD-10-CM | POA: Diagnosis not present

## 2021-08-14 DIAGNOSIS — E1100 Type 2 diabetes mellitus with hyperosmolarity without nonketotic hyperglycemic-hyperosmolar coma (NKHHC): Secondary | ICD-10-CM | POA: Diagnosis not present

## 2021-08-24 DIAGNOSIS — G629 Polyneuropathy, unspecified: Secondary | ICD-10-CM | POA: Diagnosis not present

## 2021-08-24 DIAGNOSIS — I10 Essential (primary) hypertension: Secondary | ICD-10-CM | POA: Diagnosis not present

## 2021-08-24 DIAGNOSIS — I2510 Atherosclerotic heart disease of native coronary artery without angina pectoris: Secondary | ICD-10-CM | POA: Diagnosis not present

## 2021-08-24 DIAGNOSIS — E785 Hyperlipidemia, unspecified: Secondary | ICD-10-CM | POA: Diagnosis not present

## 2021-08-24 DIAGNOSIS — R338 Other retention of urine: Secondary | ICD-10-CM | POA: Diagnosis not present

## 2021-08-28 DIAGNOSIS — I10 Essential (primary) hypertension: Secondary | ICD-10-CM | POA: Diagnosis not present

## 2021-09-13 DIAGNOSIS — R339 Retention of urine, unspecified: Secondary | ICD-10-CM | POA: Diagnosis not present

## 2021-09-27 ENCOUNTER — Ambulatory Visit (INDEPENDENT_AMBULATORY_CARE_PROVIDER_SITE_OTHER): Payer: Medicare Other | Admitting: Urology

## 2021-09-27 ENCOUNTER — Encounter: Payer: Self-pay | Admitting: Urology

## 2021-09-27 VITALS — BP 132/72 | HR 35 | Ht 71.0 in | Wt 169.0 lb

## 2021-09-27 DIAGNOSIS — N138 Other obstructive and reflux uropathy: Secondary | ICD-10-CM | POA: Diagnosis not present

## 2021-09-27 DIAGNOSIS — Z87440 Personal history of urinary (tract) infections: Secondary | ICD-10-CM | POA: Diagnosis not present

## 2021-09-27 DIAGNOSIS — R339 Retention of urine, unspecified: Secondary | ICD-10-CM

## 2021-09-27 DIAGNOSIS — N401 Enlarged prostate with lower urinary tract symptoms: Secondary | ICD-10-CM | POA: Diagnosis not present

## 2021-09-27 DIAGNOSIS — N312 Flaccid neuropathic bladder, not elsewhere classified: Secondary | ICD-10-CM | POA: Diagnosis not present

## 2021-09-27 LAB — URINALYSIS, ROUTINE W REFLEX MICROSCOPIC
Bilirubin, UA: NEGATIVE
Glucose, UA: NEGATIVE
Ketones, UA: NEGATIVE
Leukocytes,UA: NEGATIVE
Nitrite, UA: NEGATIVE
Protein,UA: NEGATIVE
RBC, UA: NEGATIVE
Specific Gravity, UA: 1.01 (ref 1.005–1.030)
Urobilinogen, Ur: 0.2 mg/dL (ref 0.2–1.0)
pH, UA: 5.5 (ref 5.0–7.5)

## 2021-09-27 LAB — BLADDER SCAN AMB NON-IMAGING: Scan Result: 832

## 2021-09-27 NOTE — Progress Notes (Signed)
Assessment: 1. BPH with obstruction/lower urinary tract symptoms   2. Incomplete bladder emptying   3. History of UTI   4. Hypotonic bladder     Plan: I personally reviewed the urodynamic study from 09/13/2021.  I discussed these results with the patient in detail. On cystoscopy, there does appear to be some regrowth of the prostate tissue following his TURP in 2016.  Urodynamics suggest a large capacity atonic/hypotonic bladder.  Given these results, I discussed options for management including continued observation, intermittent catheterization, and continuous bladder drainage with a Foley or SP tube.  He does not wish to proceed with intermittent catheterization or continuous bladder drainage at this time. Continue silodosin 8 mg p.o. daily. BMP today Renal U/S at [facility] Return to office in 3 months  He is in assisted living at [facility] [place]  Chief Complaint: Chief Complaint  Patient presents with   Benign Prostatic Hypertrophy    HPI: Albert Garrison is a 76 y.o. male who presents for continued evaluation of BPH with obstruction and urinary retention. He had a catheter placed during a hospitalization in early September 2022.  He apparently failed voiding trials and required continue Foley catheterization.  His Foley catheter was replaced on 12/29/2020.  He is status post a TURP by Dr. Annabell Howells in 2016.  He had been on tamsulosin.  He was treated for a UTI.  Urine culture grew Streptococcus. His foley was removed on 01/14/21 after successful voiding trial.  He was continued on tamsulosin and treated with Augmentin.  At his visit in 10/22, he continued on tamsulosin.  He was voiding spontaneously and felt like he was emptying his bladder well.  No dysuria or gross hematuria. PVR = 315 ml  At his visit in 12/22, he was doing well. No issues with tamsulosis-tolerating well. No issues with voiding. He reported nocturia 2-3 times/night. At his visit on 06/07/2021, he continued  on tamsulosin 0.4 mg daily with good control of his urinary symptoms.  He was not having any trouble starting his stream and felt  like he emptied his bladder well.  No dysuria or gross hematuria.  No nocturia. IPSS = 0.  At his visit in 4/23, he reported increased difficulty with voiding for 2 weeks.  He was having urgency, intermittency, hesitancy and decreased force of stream.  No dysuria or gross hematuria. IPSS = 5.  PVR = >545 ml. Placement of a Foley catheter was recommended but the patient refused.  He was changed to silodosin 8 mg daily. He continued on silodosin.  He reported a slight improvement in his urinary symptoms.  He continued to have nocturia and frequency.  No dysuria or gross hematuria. Cystoscopy from 5/23 demonstrated post TURP changes with regrowth of the adenoma at the bladder neck and lateral lobe enlargement of the prostate as well as bladder trabeculations. PVR >540 mL.  Urodynamics from 09/13/2021 showed a postvoid residual of 1300 mL, decreased bladder sensation with first sensation at 1000 mL, no instability, no leakage, no voluntary contraction and inability to void. He continues on silodosin.  He reports that his urinary symptoms are stable.  He continues to have intermittent stream and hesitancy.   Portions of the above documentation were copied from a prior visit for review purposes only.  Allergies: No Known Allergies  PMH: Past Medical History:  Diagnosis Date   Arthritis    BPH (benign prostatic hyperplasia)    CKD (chronic kidney disease), stage II    Complicated UTI (  urinary tract infection) 03/2014   Depression    GERD (gastroesophageal reflux disease)    History of pneumonia    History of stroke    HOH (hard of hearing)    Hypertension    Hyponatremia    Lower GI bleed 2017   a. ? due to polyp.   NSVT (nonsustained ventricular tachycardia) (HCC)    Rheumatic fever    Childhood   Sleep apnea    Does not use CPAP   Type 2 diabetes mellitus  (HCC)     PSH: Past Surgical History:  Procedure Laterality Date   CARPAL TUNNEL RELEASE Left 2010   CARPAL TUNNEL RELEASE Right 07/13/2017   Procedure: CARPAL TUNNEL RELEASE;  Surgeon: Vickki Hearing, MD;  Location: [facility];  Service: Orthopedics;  Laterality: Right;   CERVICAL SPINE SURGERY  2010   COLONOSCOPY WITH PROPOFOL N/A 11/30/2015   Procedure: COLONOSCOPY WITH PROPOFOL;  Surgeon: Corbin Ade, MD;  Location[facility];  Service: Endoscopy;  Laterality: N/A;   KNEE ARTHROSCOPY  1989   LUMBAR LAMINECTOMY  1986   Nose surgery - broken nose     POLYPECTOMY  11/30/2015   Procedure: POLYPECTOMY;  Surgeon: Corbin Ade, MD;  Location: [facility];  Service: Endoscopy;;  polyp at ascending colon, rectal polyp   TRANSURETHRAL INCISION OF PROSTATE N/A 01/20/2015   Procedure: TRANSURETHRAL INCISION OF THE PROSTATE (TUIP);  Surgeon: Bjorn Pippin, MD;  Location: [facility];  Service: Urology;  Laterality: N/A;    SH: Social History   Tobacco Use   Smoking status: Former    Packs/day: 1.50    Years: 14.00    Total pack years: 21.00    Types: Cigarettes    Quit date: 08/06/1975    Years since quitting: 46.1   Smokeless tobacco: Never  Vaping Use   Vaping Use: Never used  Substance Use Topics   Alcohol use: Not Currently    Alcohol/week: 3.0 standard drinks of alcohol    Types: 3 Cans of beer per week    Comment: 3 12 oz cans of beer each week.None since 03/2014   Drug use: No    ROS: Constitutional:  Negative for fever, chills, weight loss CV: Negative for chest pain, previous MI, hypertension Respiratory:  Negative for shortness of breath, wheezing, sleep apnea, frequent cough GI:  Negative for nausea, vomiting, bloody stool, GERD  PE: BP 132/72   Pulse (!) 35   Ht 5\' 11"  (1.803 m)   Wt 169 lb (76.7 kg)   BMI 23.57 kg/m  GENERAL APPEARANCE:  Well appearing, well developed, well nourished, NAD HEENT:  Atraumatic, normocephalic, oropharynx clear NECK:  Supple  without lymphadenopathy or thyromegaly ABDOMEN:  Soft, non-tender, no masses EXTREMITIES:  Moves all extremities well, without clubbing, cyanosis, or edema NEUROLOGIC:  Alert and oriented x 3, normal gait, CN II-XII grossly intact MENTAL STATUS:  appropriate BACK:  Non-tender to palpation, No CVAT SKIN:  Warm, dry, and intact  Results: U/A: dipstick negative  PVR:  832 ml

## 2021-09-28 DIAGNOSIS — I4729 Other ventricular tachycardia: Secondary | ICD-10-CM | POA: Diagnosis not present

## 2021-09-28 DIAGNOSIS — N189 Chronic kidney disease, unspecified: Secondary | ICD-10-CM | POA: Diagnosis not present

## 2021-09-28 DIAGNOSIS — K219 Gastro-esophageal reflux disease without esophagitis: Secondary | ICD-10-CM | POA: Diagnosis not present

## 2021-09-28 DIAGNOSIS — M1990 Unspecified osteoarthritis, unspecified site: Secondary | ICD-10-CM | POA: Diagnosis not present

## 2021-09-28 DIAGNOSIS — G4730 Sleep apnea, unspecified: Secondary | ICD-10-CM | POA: Diagnosis not present

## 2021-09-28 LAB — BASIC METABOLIC PANEL
BUN/Creatinine Ratio: 11 (ref 10–24)
BUN: 15 mg/dL (ref 8–27)
CO2: 24 mmol/L (ref 20–29)
Calcium: 9.5 mg/dL (ref 8.6–10.2)
Chloride: 99 mmol/L (ref 96–106)
Creatinine, Ser: 1.32 mg/dL — ABNORMAL HIGH (ref 0.76–1.27)
Glucose: 151 mg/dL — ABNORMAL HIGH (ref 70–99)
Potassium: 4.3 mmol/L (ref 3.5–5.2)
Sodium: 137 mmol/L (ref 134–144)
eGFR: 56 mL/min/{1.73_m2} — ABNORMAL LOW (ref >59)

## 2021-09-29 DIAGNOSIS — E038 Other specified hypothyroidism: Secondary | ICD-10-CM | POA: Diagnosis not present

## 2021-09-29 DIAGNOSIS — E1100 Type 2 diabetes mellitus with hyperosmolarity without nonketotic hyperglycemic-hyperosmolar coma (NKHHC): Secondary | ICD-10-CM | POA: Diagnosis not present

## 2021-09-29 DIAGNOSIS — E559 Vitamin D deficiency, unspecified: Secondary | ICD-10-CM | POA: Diagnosis not present

## 2021-09-29 DIAGNOSIS — E785 Hyperlipidemia, unspecified: Secondary | ICD-10-CM | POA: Diagnosis not present

## 2021-09-29 DIAGNOSIS — E1165 Type 2 diabetes mellitus with hyperglycemia: Secondary | ICD-10-CM | POA: Diagnosis not present

## 2021-09-29 DIAGNOSIS — M1990 Unspecified osteoarthritis, unspecified site: Secondary | ICD-10-CM | POA: Diagnosis not present

## 2021-09-29 DIAGNOSIS — I129 Hypertensive chronic kidney disease with stage 1 through stage 4 chronic kidney disease, or unspecified chronic kidney disease: Secondary | ICD-10-CM | POA: Diagnosis not present

## 2021-09-29 DIAGNOSIS — E119 Type 2 diabetes mellitus without complications: Secondary | ICD-10-CM | POA: Diagnosis not present

## 2021-09-29 DIAGNOSIS — I2510 Atherosclerotic heart disease of native coronary artery without angina pectoris: Secondary | ICD-10-CM | POA: Diagnosis not present

## 2021-09-29 DIAGNOSIS — D518 Other vitamin B12 deficiency anemias: Secondary | ICD-10-CM | POA: Diagnosis not present

## 2021-09-29 DIAGNOSIS — I10 Essential (primary) hypertension: Secondary | ICD-10-CM | POA: Diagnosis not present

## 2021-09-30 DIAGNOSIS — E1165 Type 2 diabetes mellitus with hyperglycemia: Secondary | ICD-10-CM | POA: Diagnosis not present

## 2021-10-04 ENCOUNTER — Ambulatory Visit (HOSPITAL_BASED_OUTPATIENT_CLINIC_OR_DEPARTMENT_OTHER)
Admission: RE | Admit: 2021-10-04 | Discharge: 2021-10-04 | Disposition: A | Discharge status: Home / Self Care | Payer: Medicare Other | Source: Ambulatory Visit | Attending: Urology | Admitting: Urology

## 2021-10-04 DIAGNOSIS — N401 Enlarged prostate with lower urinary tract symptoms: Secondary | ICD-10-CM | POA: Diagnosis present

## 2021-10-04 DIAGNOSIS — N281 Cyst of kidney, acquired: Secondary | ICD-10-CM | POA: Diagnosis not present

## 2021-10-04 DIAGNOSIS — N138 Other obstructive and reflux uropathy: Secondary | ICD-10-CM | POA: Insufficient documentation

## 2021-10-04 DIAGNOSIS — R339 Retention of urine, unspecified: Secondary | ICD-10-CM

## 2021-10-06 ENCOUNTER — Telehealth: Payer: Self-pay

## 2021-10-06 NOTE — Telephone Encounter (Signed)
-----   Message from Primus Bravo, MD sent at 10/06/2021  8:38 AM EDT ----- Please notify patient that his renal U/S shows 2 benign cysts in the right kidney and an enlarge prostate but no evidence of obstruction to either kidney.   Recommend follow-up in 3 months as scheduled.

## 2021-10-06 NOTE — Telephone Encounter (Signed)
Called to inform facility of results, left message with Loma Sousa of Dr. Carlyle Lipa response to results and to keep upcoming apt.  She will relay message to his nurse.

## 2021-10-14 DIAGNOSIS — I129 Hypertensive chronic kidney disease with stage 1 through stage 4 chronic kidney disease, or unspecified chronic kidney disease: Secondary | ICD-10-CM | POA: Diagnosis not present

## 2021-10-14 DIAGNOSIS — E038 Other specified hypothyroidism: Secondary | ICD-10-CM | POA: Diagnosis not present

## 2021-10-14 DIAGNOSIS — D518 Other vitamin B12 deficiency anemias: Secondary | ICD-10-CM | POA: Diagnosis not present

## 2021-10-14 DIAGNOSIS — I2510 Atherosclerotic heart disease of native coronary artery without angina pectoris: Secondary | ICD-10-CM | POA: Diagnosis not present

## 2021-10-14 DIAGNOSIS — E559 Vitamin D deficiency, unspecified: Secondary | ICD-10-CM | POA: Diagnosis not present

## 2021-10-14 DIAGNOSIS — E1100 Type 2 diabetes mellitus with hyperosmolarity without nonketotic hyperglycemic-hyperosmolar coma (NKHHC): Secondary | ICD-10-CM | POA: Diagnosis not present

## 2021-10-14 DIAGNOSIS — E785 Hyperlipidemia, unspecified: Secondary | ICD-10-CM | POA: Diagnosis not present

## 2021-10-14 DIAGNOSIS — M1990 Unspecified osteoarthritis, unspecified site: Secondary | ICD-10-CM | POA: Diagnosis not present

## 2021-10-14 DIAGNOSIS — E119 Type 2 diabetes mellitus without complications: Secondary | ICD-10-CM | POA: Diagnosis not present

## 2021-10-14 DIAGNOSIS — E1165 Type 2 diabetes mellitus with hyperglycemia: Secondary | ICD-10-CM | POA: Diagnosis not present

## 2021-10-14 DIAGNOSIS — I10 Essential (primary) hypertension: Secondary | ICD-10-CM | POA: Diagnosis not present

## 2021-10-26 DIAGNOSIS — E785 Hyperlipidemia, unspecified: Secondary | ICD-10-CM | POA: Diagnosis not present

## 2021-10-26 DIAGNOSIS — R338 Other retention of urine: Secondary | ICD-10-CM | POA: Diagnosis not present

## 2021-10-26 DIAGNOSIS — E119 Type 2 diabetes mellitus without complications: Secondary | ICD-10-CM | POA: Diagnosis not present

## 2021-10-26 DIAGNOSIS — G4730 Sleep apnea, unspecified: Secondary | ICD-10-CM | POA: Diagnosis not present

## 2021-10-26 DIAGNOSIS — I2510 Atherosclerotic heart disease of native coronary artery without angina pectoris: Secondary | ICD-10-CM | POA: Diagnosis not present

## 2021-10-26 DIAGNOSIS — R300 Dysuria: Secondary | ICD-10-CM | POA: Diagnosis not present

## 2021-10-26 DIAGNOSIS — N189 Chronic kidney disease, unspecified: Secondary | ICD-10-CM | POA: Diagnosis not present

## 2021-10-30 DIAGNOSIS — E1165 Type 2 diabetes mellitus with hyperglycemia: Secondary | ICD-10-CM | POA: Diagnosis not present

## 2021-11-03 DIAGNOSIS — R011 Cardiac murmur, unspecified: Secondary | ICD-10-CM | POA: Diagnosis not present

## 2021-11-09 DIAGNOSIS — R0989 Other specified symptoms and signs involving the circulatory and respiratory systems: Secondary | ICD-10-CM | POA: Diagnosis not present

## 2021-11-09 DIAGNOSIS — I6523 Occlusion and stenosis of bilateral carotid arteries: Secondary | ICD-10-CM | POA: Diagnosis not present

## 2021-11-30 DIAGNOSIS — N189 Chronic kidney disease, unspecified: Secondary | ICD-10-CM | POA: Diagnosis not present

## 2021-11-30 DIAGNOSIS — E119 Type 2 diabetes mellitus without complications: Secondary | ICD-10-CM | POA: Diagnosis not present

## 2021-11-30 DIAGNOSIS — I2510 Atherosclerotic heart disease of native coronary artery without angina pectoris: Secondary | ICD-10-CM | POA: Diagnosis not present

## 2021-11-30 DIAGNOSIS — I10 Essential (primary) hypertension: Secondary | ICD-10-CM | POA: Diagnosis not present

## 2021-11-30 DIAGNOSIS — G629 Polyneuropathy, unspecified: Secondary | ICD-10-CM | POA: Diagnosis not present

## 2021-11-30 DIAGNOSIS — E1165 Type 2 diabetes mellitus with hyperglycemia: Secondary | ICD-10-CM | POA: Diagnosis not present

## 2021-11-30 DIAGNOSIS — F32A Depression, unspecified: Secondary | ICD-10-CM | POA: Diagnosis not present

## 2021-11-30 DIAGNOSIS — K219 Gastro-esophageal reflux disease without esophagitis: Secondary | ICD-10-CM | POA: Diagnosis not present

## 2021-12-01 DIAGNOSIS — I10 Essential (primary) hypertension: Secondary | ICD-10-CM | POA: Diagnosis not present

## 2021-12-01 DIAGNOSIS — E785 Hyperlipidemia, unspecified: Secondary | ICD-10-CM | POA: Diagnosis not present

## 2021-12-01 DIAGNOSIS — E1100 Type 2 diabetes mellitus with hyperosmolarity without nonketotic hyperglycemic-hyperosmolar coma (NKHHC): Secondary | ICD-10-CM | POA: Diagnosis not present

## 2021-12-01 DIAGNOSIS — D518 Other vitamin B12 deficiency anemias: Secondary | ICD-10-CM | POA: Diagnosis not present

## 2021-12-01 DIAGNOSIS — E1165 Type 2 diabetes mellitus with hyperglycemia: Secondary | ICD-10-CM | POA: Diagnosis not present

## 2021-12-01 DIAGNOSIS — E038 Other specified hypothyroidism: Secondary | ICD-10-CM | POA: Diagnosis not present

## 2021-12-01 DIAGNOSIS — E559 Vitamin D deficiency, unspecified: Secondary | ICD-10-CM | POA: Diagnosis not present

## 2021-12-01 DIAGNOSIS — M1990 Unspecified osteoarthritis, unspecified site: Secondary | ICD-10-CM | POA: Diagnosis not present

## 2021-12-01 DIAGNOSIS — I2510 Atherosclerotic heart disease of native coronary artery without angina pectoris: Secondary | ICD-10-CM | POA: Diagnosis not present

## 2021-12-01 DIAGNOSIS — I129 Hypertensive chronic kidney disease with stage 1 through stage 4 chronic kidney disease, or unspecified chronic kidney disease: Secondary | ICD-10-CM | POA: Diagnosis not present

## 2021-12-01 DIAGNOSIS — E119 Type 2 diabetes mellitus without complications: Secondary | ICD-10-CM | POA: Diagnosis not present

## 2021-12-02 DIAGNOSIS — I77811 Abdominal aortic ectasia: Secondary | ICD-10-CM | POA: Diagnosis not present

## 2021-12-07 ENCOUNTER — Ambulatory Visit (INDEPENDENT_AMBULATORY_CARE_PROVIDER_SITE_OTHER): Payer: Medicare Other | Admitting: Urology

## 2021-12-07 ENCOUNTER — Encounter: Payer: Self-pay | Admitting: Urology

## 2021-12-07 VITALS — BP 113/66 | HR 51 | Ht 71.0 in | Wt 169.0 lb

## 2021-12-07 DIAGNOSIS — N138 Other obstructive and reflux uropathy: Secondary | ICD-10-CM

## 2021-12-07 DIAGNOSIS — N312 Flaccid neuropathic bladder, not elsewhere classified: Secondary | ICD-10-CM | POA: Diagnosis not present

## 2021-12-07 DIAGNOSIS — N401 Enlarged prostate with lower urinary tract symptoms: Secondary | ICD-10-CM | POA: Diagnosis not present

## 2021-12-07 DIAGNOSIS — Z87440 Personal history of urinary (tract) infections: Secondary | ICD-10-CM | POA: Diagnosis not present

## 2021-12-07 DIAGNOSIS — R339 Retention of urine, unspecified: Secondary | ICD-10-CM

## 2021-12-07 LAB — URINALYSIS, ROUTINE W REFLEX MICROSCOPIC
Bilirubin, UA: NEGATIVE
Glucose, UA: NEGATIVE
Ketones, UA: NEGATIVE
Leukocytes,UA: NEGATIVE
Nitrite, UA: NEGATIVE
Protein,UA: NEGATIVE
RBC, UA: NEGATIVE
Specific Gravity, UA: 1.01 (ref 1.005–1.030)
Urobilinogen, Ur: 0.2 mg/dL (ref 0.2–1.0)
pH, UA: 6 (ref 5.0–7.5)

## 2021-12-07 NOTE — Progress Notes (Signed)
Assessment: 1. BPH with obstruction/lower urinary tract symptoms   2. Incomplete bladder emptying   3. Hypotonic bladder   4. History of UTI      Plan: I again reviewed the findings on prior evaluation including cystoscopy and urodynamics.  He has evidence of a large capacity atonic/hypotonic bladder.  Options for management have been previously discussed including intermittent catheterization, bladder drainage with Foley catheter or suprapubic catheter.  He remains uninterested in any of these options and would like to continue with medical therapy. Continue silodosin 8 mg daily. Return to office in 6 months.  He is in assisted living at [facility] [place]  Chief Complaint: Chief Complaint  Patient presents with   Benign Prostatic Hypertrophy    HPI: Albert Garrison is a 76 y.o. male who presents for continued evaluation of BPH with obstruction, incomplete bladder emptying, and history of urinary retention. He had a catheter placed during a hospitalization in early September 2022.  He apparently failed voiding trials and required continue Foley catheterization.  His Foley catheter was replaced on 12/29/2020.  He is status post a TURP by Dr. Jeffie Pollock in 2016.  He had been on tamsulosin.  He was treated for a UTI.  Urine culture grew Streptococcus. His foley was removed on 01/14/21 after successful voiding trial.  He was continued on tamsulosin and treated with Augmentin.  At his visit in 10/22, he continued on tamsulosin.  He was voiding spontaneously and felt like he was emptying his bladder well.  No dysuria or gross hematuria. PVR = 315 ml  At his visit in 12/22, he was doing well. No issues with tamsulosis-tolerating well. No issues with voiding. He reported nocturia 2-3 times/night. At his visit on 06/07/2021, he continued on tamsulosin 0.4 mg daily with good control of his urinary symptoms.  He was not having any trouble starting his stream and felt  like he emptied his bladder  well.  No dysuria or gross hematuria.  No nocturia. IPSS = 0.  At his visit in 4/23, he reported increased difficulty with voiding for 2 weeks.  He was having urgency, intermittency, hesitancy and decreased force of stream.  No dysuria or gross hematuria. IPSS = 5.  PVR = >545 ml. Placement of a Foley catheter was recommended but the patient refused.  He was changed to silodosin 8 mg daily. He continued on silodosin.  He reported a slight improvement in his urinary symptoms.  He continued to have nocturia and frequency.  No dysuria or gross hematuria. Cystoscopy from 5/23 demonstrated post TURP changes with regrowth of the adenoma at the bladder neck and lateral lobe enlargement of the prostate as well as bladder trabeculations. PVR >540 mL.  Urodynamics from 09/13/2021 showed a postvoid residual of 1300 mL, decreased bladder sensation with first sensation at 1000 mL, no instability, no leakage, no voluntary contraction and inability to void. He continued on silodosin with stable LUTS with intermittent stream and hesitancy.  PVR 6/23 = 832 ml. Cr 1.32 Renal U/S from 10/04/21 showed small right renal cyst, no hydronephrosis, and enlarged prostate. He did not wish to proceed with intermittent catheterization or continuous bladder drainage.  He returns today for scheduled follow-up.  He continues on silodosin 8 mg daily.  He reports no change in his urinary symptoms.  No dysuria or gross hematuria.  No flank pain.  No problems with constipation.  No recent UTIs.   Portions of the above documentation were copied from a prior visit for review  purposes only.  Allergies: No Known Allergies  PMH: Past Medical History:  Diagnosis Date   Arthritis    BPH (benign prostatic hyperplasia)    CKD (chronic kidney disease), stage II    Complicated UTI (urinary tract infection) 03/2014   Depression    GERD (gastroesophageal reflux disease)    History of pneumonia    History of stroke    HOH (hard of  hearing)    Hypertension    Hyponatremia    Lower GI bleed 2017   a. ? due to polyp.   NSVT (nonsustained ventricular tachycardia) (HCC)    Rheumatic fever    Childhood   Sleep apnea    Does not use CPAP   Type 2 diabetes mellitus ([place])     PSH: Past Surgical History:  Procedure Laterality Date   CARPAL TUNNEL RELEASE Left 2010   CARPAL TUNNEL RELEASE Right 07/13/2017   Procedure: CARPAL TUNNEL RELEASE;  Surgeon: Carole Civil, MD;  Location: [facility];  Service: Orthopedics;  Laterality: Right;   CERVICAL SPINE SURGERY  2010   COLONOSCOPY WITH PROPOFOL N/A 11/30/2015   Procedure: COLONOSCOPY WITH PROPOFOL;  Surgeon: Daneil Dolin, MD;  [facility];  Service: Endoscopy;  Laterality: N/A;   KNEE ARTHROSCOPY  [place] surgery - broken nose     POLYPECTOMY  11/30/2015   Procedure: POLYPECTOMY;  Surgeon: Daneil Dolin, MD;  [facility];  Service: Endoscopy;;  polyp at ascending colon, rectal polyp   TRANSURETHRAL INCISION OF PROSTATE N/A 01/20/2015   Procedure: TRANSURETHRAL INCISION OF THE PROSTATE (TUIP);  Surgeon: Irine Seal, MD;  Location[facility];  Service: Urology;  Laterality: N/A;    SH: Social History   Tobacco Use   Smoking status: Former    Packs/day: 1.50    Years: 14.00    Total pack years: 21.00    Types: Cigarettes    Quit date: 08/06/1975    Years since quitting: 46.3   Smokeless tobacco: Never  Vaping Use   Vaping Use: Never used  Substance Use Topics   Alcohol use: Not Currently    Alcohol/week: 3.0 standard drinks of alcohol    Types: 3 Cans of beer per week    Comment: 3 12 oz cans of beer each week.None since 03/2014   Drug use: No    ROS: Constitutional:  Negative for fever, chills, weight loss CV: Negative for chest pain, previous MI, hypertension Respiratory:  Negative for shortness of breath, wheezing, sleep apnea, frequent cough GI:  Negative for nausea, vomiting, bloody stool,  GERD  PE: BP 113/66   Pulse (!) 51   Ht '5\' 11"'$  (1.803 m)   Wt 169 lb (76.7 kg)   BMI 23.57 kg/m  GENERAL APPEARANCE:  Well appearing, well developed, well nourished, NAD HEENT:  Atraumatic, normocephalic, oropharynx clear NECK:  Supple without lymphadenopathy or thyromegaly ABDOMEN:  Soft, non-tender, no masses EXTREMITIES:  Moves all extremities well, without clubbing, cyanosis, or edema NEUROLOGIC:  Alert and oriented x 3, CN II-XII grossly intact MENTAL STATUS:  appropriate BACK:  Non-tender to palpation, No CVAT SKIN:  Warm, dry, and intact  Results: U/A: Dipstick negative

## 2021-12-10 DIAGNOSIS — I70223 Atherosclerosis of native arteries of extremities with rest pain, bilateral legs: Secondary | ICD-10-CM | POA: Diagnosis not present

## 2021-12-20 DIAGNOSIS — D518 Other vitamin B12 deficiency anemias: Secondary | ICD-10-CM | POA: Diagnosis not present

## 2021-12-20 DIAGNOSIS — E559 Vitamin D deficiency, unspecified: Secondary | ICD-10-CM | POA: Diagnosis not present

## 2021-12-20 DIAGNOSIS — E782 Mixed hyperlipidemia: Secondary | ICD-10-CM | POA: Diagnosis not present

## 2021-12-20 DIAGNOSIS — E119 Type 2 diabetes mellitus without complications: Secondary | ICD-10-CM | POA: Diagnosis not present

## 2021-12-20 DIAGNOSIS — E038 Other specified hypothyroidism: Secondary | ICD-10-CM | POA: Diagnosis not present

## 2021-12-20 DIAGNOSIS — Z79899 Other long term (current) drug therapy: Secondary | ICD-10-CM | POA: Diagnosis not present

## 2021-12-28 DIAGNOSIS — M1990 Unspecified osteoarthritis, unspecified site: Secondary | ICD-10-CM | POA: Diagnosis not present

## 2021-12-28 DIAGNOSIS — K219 Gastro-esophageal reflux disease without esophagitis: Secondary | ICD-10-CM | POA: Diagnosis not present

## 2021-12-28 DIAGNOSIS — E785 Hyperlipidemia, unspecified: Secondary | ICD-10-CM | POA: Diagnosis not present

## 2021-12-28 DIAGNOSIS — I2510 Atherosclerotic heart disease of native coronary artery without angina pectoris: Secondary | ICD-10-CM | POA: Diagnosis not present

## 2021-12-28 DIAGNOSIS — G629 Polyneuropathy, unspecified: Secondary | ICD-10-CM | POA: Diagnosis not present

## 2021-12-31 DIAGNOSIS — E1165 Type 2 diabetes mellitus with hyperglycemia: Secondary | ICD-10-CM | POA: Diagnosis not present

## 2022-01-01 DIAGNOSIS — M1990 Unspecified osteoarthritis, unspecified site: Secondary | ICD-10-CM | POA: Diagnosis not present

## 2022-01-01 DIAGNOSIS — E785 Hyperlipidemia, unspecified: Secondary | ICD-10-CM | POA: Diagnosis not present

## 2022-01-01 DIAGNOSIS — E559 Vitamin D deficiency, unspecified: Secondary | ICD-10-CM | POA: Diagnosis not present

## 2022-01-01 DIAGNOSIS — D518 Other vitamin B12 deficiency anemias: Secondary | ICD-10-CM | POA: Diagnosis not present

## 2022-01-01 DIAGNOSIS — I2510 Atherosclerotic heart disease of native coronary artery without angina pectoris: Secondary | ICD-10-CM | POA: Diagnosis not present

## 2022-01-01 DIAGNOSIS — E119 Type 2 diabetes mellitus without complications: Secondary | ICD-10-CM | POA: Diagnosis not present

## 2022-01-01 DIAGNOSIS — I129 Hypertensive chronic kidney disease with stage 1 through stage 4 chronic kidney disease, or unspecified chronic kidney disease: Secondary | ICD-10-CM | POA: Diagnosis not present

## 2022-01-01 DIAGNOSIS — E1100 Type 2 diabetes mellitus with hyperosmolarity without nonketotic hyperglycemic-hyperosmolar coma (NKHHC): Secondary | ICD-10-CM | POA: Diagnosis not present

## 2022-01-01 DIAGNOSIS — E038 Other specified hypothyroidism: Secondary | ICD-10-CM | POA: Diagnosis not present

## 2022-01-01 DIAGNOSIS — I10 Essential (primary) hypertension: Secondary | ICD-10-CM | POA: Diagnosis not present

## 2022-01-01 DIAGNOSIS — E1165 Type 2 diabetes mellitus with hyperglycemia: Secondary | ICD-10-CM | POA: Diagnosis not present

## 2022-01-30 DIAGNOSIS — E1165 Type 2 diabetes mellitus with hyperglycemia: Secondary | ICD-10-CM | POA: Diagnosis not present

## 2022-01-31 DIAGNOSIS — E119 Type 2 diabetes mellitus without complications: Secondary | ICD-10-CM | POA: Diagnosis not present

## 2022-01-31 DIAGNOSIS — E1165 Type 2 diabetes mellitus with hyperglycemia: Secondary | ICD-10-CM | POA: Diagnosis not present

## 2022-01-31 DIAGNOSIS — E038 Other specified hypothyroidism: Secondary | ICD-10-CM | POA: Diagnosis not present

## 2022-01-31 DIAGNOSIS — E559 Vitamin D deficiency, unspecified: Secondary | ICD-10-CM | POA: Diagnosis not present

## 2022-01-31 DIAGNOSIS — M1990 Unspecified osteoarthritis, unspecified site: Secondary | ICD-10-CM | POA: Diagnosis not present

## 2022-01-31 DIAGNOSIS — E785 Hyperlipidemia, unspecified: Secondary | ICD-10-CM | POA: Diagnosis not present

## 2022-01-31 DIAGNOSIS — I2510 Atherosclerotic heart disease of native coronary artery without angina pectoris: Secondary | ICD-10-CM | POA: Diagnosis not present

## 2022-01-31 DIAGNOSIS — D518 Other vitamin B12 deficiency anemias: Secondary | ICD-10-CM | POA: Diagnosis not present

## 2022-01-31 DIAGNOSIS — I10 Essential (primary) hypertension: Secondary | ICD-10-CM | POA: Diagnosis not present

## 2022-01-31 DIAGNOSIS — I129 Hypertensive chronic kidney disease with stage 1 through stage 4 chronic kidney disease, or unspecified chronic kidney disease: Secondary | ICD-10-CM | POA: Diagnosis not present

## 2022-01-31 DIAGNOSIS — E1100 Type 2 diabetes mellitus with hyperosmolarity without nonketotic hyperglycemic-hyperosmolar coma (NKHHC): Secondary | ICD-10-CM | POA: Diagnosis not present

## 2022-02-01 DIAGNOSIS — K219 Gastro-esophageal reflux disease without esophagitis: Secondary | ICD-10-CM | POA: Diagnosis not present

## 2022-02-01 DIAGNOSIS — F32A Depression, unspecified: Secondary | ICD-10-CM | POA: Diagnosis not present

## 2022-02-01 DIAGNOSIS — M1990 Unspecified osteoarthritis, unspecified site: Secondary | ICD-10-CM | POA: Diagnosis not present

## 2022-02-01 DIAGNOSIS — I2510 Atherosclerotic heart disease of native coronary artery without angina pectoris: Secondary | ICD-10-CM | POA: Diagnosis not present

## 2022-02-01 DIAGNOSIS — E119 Type 2 diabetes mellitus without complications: Secondary | ICD-10-CM | POA: Diagnosis not present

## 2022-02-01 DIAGNOSIS — I10 Essential (primary) hypertension: Secondary | ICD-10-CM | POA: Diagnosis not present

## 2022-02-01 DIAGNOSIS — E785 Hyperlipidemia, unspecified: Secondary | ICD-10-CM | POA: Diagnosis not present

## 2022-02-01 DIAGNOSIS — N189 Chronic kidney disease, unspecified: Secondary | ICD-10-CM | POA: Diagnosis not present

## 2022-02-01 DIAGNOSIS — R2681 Unsteadiness on feet: Secondary | ICD-10-CM | POA: Diagnosis not present

## 2022-02-01 DIAGNOSIS — G629 Polyneuropathy, unspecified: Secondary | ICD-10-CM | POA: Diagnosis not present

## 2022-02-07 DIAGNOSIS — L600 Ingrowing nail: Secondary | ICD-10-CM | POA: Diagnosis not present

## 2022-02-07 DIAGNOSIS — M79672 Pain in left foot: Secondary | ICD-10-CM | POA: Diagnosis not present

## 2022-02-07 DIAGNOSIS — M79671 Pain in right foot: Secondary | ICD-10-CM | POA: Diagnosis not present

## 2022-02-07 DIAGNOSIS — B351 Tinea unguium: Secondary | ICD-10-CM | POA: Diagnosis not present

## 2022-02-08 DIAGNOSIS — D518 Other vitamin B12 deficiency anemias: Secondary | ICD-10-CM | POA: Diagnosis not present

## 2022-02-08 DIAGNOSIS — E1165 Type 2 diabetes mellitus with hyperglycemia: Secondary | ICD-10-CM | POA: Diagnosis not present

## 2022-02-08 DIAGNOSIS — E038 Other specified hypothyroidism: Secondary | ICD-10-CM | POA: Diagnosis not present

## 2022-02-08 DIAGNOSIS — M1990 Unspecified osteoarthritis, unspecified site: Secondary | ICD-10-CM | POA: Diagnosis not present

## 2022-02-08 DIAGNOSIS — I129 Hypertensive chronic kidney disease with stage 1 through stage 4 chronic kidney disease, or unspecified chronic kidney disease: Secondary | ICD-10-CM | POA: Diagnosis not present

## 2022-02-08 DIAGNOSIS — E1100 Type 2 diabetes mellitus with hyperosmolarity without nonketotic hyperglycemic-hyperosmolar coma (NKHHC): Secondary | ICD-10-CM | POA: Diagnosis not present

## 2022-02-08 DIAGNOSIS — I10 Essential (primary) hypertension: Secondary | ICD-10-CM | POA: Diagnosis not present

## 2022-02-08 DIAGNOSIS — E559 Vitamin D deficiency, unspecified: Secondary | ICD-10-CM | POA: Diagnosis not present

## 2022-02-08 DIAGNOSIS — I2510 Atherosclerotic heart disease of native coronary artery without angina pectoris: Secondary | ICD-10-CM | POA: Diagnosis not present

## 2022-02-08 DIAGNOSIS — E119 Type 2 diabetes mellitus without complications: Secondary | ICD-10-CM | POA: Diagnosis not present

## 2022-02-08 DIAGNOSIS — E785 Hyperlipidemia, unspecified: Secondary | ICD-10-CM | POA: Diagnosis not present

## 2022-02-15 ENCOUNTER — Encounter: Payer: Self-pay | Admitting: Urology

## 2022-02-15 ENCOUNTER — Ambulatory Visit (INDEPENDENT_AMBULATORY_CARE_PROVIDER_SITE_OTHER): Payer: Medicare Other | Admitting: Urology

## 2022-02-15 VITALS — BP 99/63 | HR 51 | Ht 71.0 in | Wt 169.0 lb

## 2022-02-15 DIAGNOSIS — Z87440 Personal history of urinary (tract) infections: Secondary | ICD-10-CM

## 2022-02-15 DIAGNOSIS — N312 Flaccid neuropathic bladder, not elsewhere classified: Secondary | ICD-10-CM

## 2022-02-15 DIAGNOSIS — R339 Retention of urine, unspecified: Secondary | ICD-10-CM | POA: Diagnosis not present

## 2022-02-15 DIAGNOSIS — N401 Enlarged prostate with lower urinary tract symptoms: Secondary | ICD-10-CM | POA: Diagnosis not present

## 2022-02-15 DIAGNOSIS — N138 Other obstructive and reflux uropathy: Secondary | ICD-10-CM | POA: Diagnosis not present

## 2022-02-15 LAB — BLADDER SCAN AMB NON-IMAGING: Scan Result: 586

## 2022-02-15 NOTE — Progress Notes (Signed)
post void residual =530m

## 2022-02-15 NOTE — Progress Notes (Signed)
Assessment: 1. BPH with obstruction/lower urinary tract symptoms   2. Incomplete bladder emptying   3. Hypotonic bladder   4. History of UTI     Plan: No evidence of UTI or prostatitis today. Symptoms are consistent with his known hypotonic/atonic bladder. Options for management have been previously discussed including intermittent catheterization, bladder drainage with Foley catheter or suprapubic catheter.  He remains uninterested in any of these options and would like to continue with medical therapy. Continue silodosin 8 mg daily. Return to office in 3 months.  He is in assisted living at [facility] [place]  Chief Complaint: Chief Complaint  Patient presents with   Benign Prostatic Hypertrophy    HPI: Albert Garrison is a 76 y.o. male who presents for continued evaluation of BPH with obstruction, incomplete bladder emptying, and history of urinary retention. He had a catheter placed during a hospitalization in early September 2022.  He apparently failed voiding trials and required continue Foley catheterization.  His Foley catheter was replaced on 12/29/2020.  He is status post a TURP by Dr. Jeffie Pollock in 2016.  He had been on tamsulosin.  He was treated for a UTI.  Urine culture grew Streptococcus. His foley was removed on 01/14/21 after successful voiding trial.  He was continued on tamsulosin and treated with Augmentin.  At his visit in 10/22, he continued on tamsulosin.  He was voiding spontaneously and felt like he was emptying his bladder well.  No dysuria or gross hematuria. PVR = 315 ml  At his visit in 12/22, he was doing well. No issues with tamsulosis-tolerating well. No issues with voiding. He reported nocturia 2-3 times/night. At his visit on 06/07/2021, he continued on tamsulosin 0.4 mg daily with good control of his urinary symptoms.  He was not having any trouble starting his stream and felt  like he emptied his bladder well.  No dysuria or gross hematuria.  No  nocturia. IPSS = 0.  At his visit in 4/23, he reported increased difficulty with voiding for 2 weeks.  He was having urgency, intermittency, hesitancy and decreased force of stream.  No dysuria or gross hematuria. IPSS = 5.  PVR = >545 ml. Placement of a Foley catheter was recommended but the patient refused.  He was changed to silodosin 8 mg daily. He continued on silodosin.  He reported a slight improvement in his urinary symptoms.  He continued to have nocturia and frequency.  No dysuria or gross hematuria. Cystoscopy from 5/23 demonstrated post TURP changes with regrowth of the adenoma at the bladder neck and lateral lobe enlargement of the prostate as well as bladder trabeculations. PVR >540 mL.  Urodynamics from 09/13/2021 showed a postvoid residual of 1300 mL, decreased bladder sensation with first sensation at 1000 mL, no instability, no leakage, no voluntary contraction and inability to void. He continued on silodosin with stable LUTS with intermittent stream and hesitancy.  PVR 6/23 = 832 ml. Cr 1.32 Renal U/S from 10/04/21 showed small right renal cyst, no hydronephrosis, and enlarged prostate. He has not wanted to proceed with intermittent catheterization or continuous bladder drainage.  He returns today for follow-up.  He continues on silodosin 8 mg daily.  He has noted some increased difficulty initiating his stream, primarily in the evening.  He has also noted some slight discomfort with voiding.  He is having some issues with constipation as well.  No gross hematuria or flank pain. IPSS = 19 today.   Portions of the above documentation were  copied from a prior visit for review purposes only.  Allergies: No Known Allergies  PMH: Past Medical History:  Diagnosis Date   Arthritis    BPH (benign prostatic hyperplasia)    CKD (chronic kidney disease), stage II    Complicated UTI (urinary tract infection) 03/2014   Depression    GERD (gastroesophageal reflux disease)     History of pneumonia    History of stroke    HOH (hard of hearing)    Hypertension    Hyponatremia    Lower GI bleed 2017   a. ? due to polyp.   NSVT (nonsustained ventricular tachycardia) (HCC)    Rheumatic fever    Childhood   Sleep apnea    Does not use CPAP   Type 2 diabetes mellitus ([place])     PSH: Past Surgical History:  Procedure Laterality Date   CARPAL TUNNEL RELEASE Left 2010   CARPAL TUNNEL RELEASE Right 07/13/2017   Procedure: CARPAL TUNNEL RELEASE;  Surgeon: Carole Civil, MD;  Location: [facility];  Service: Orthopedics;  Laterality: Right;   CERVICAL SPINE SURGERY  2010   COLONOSCOPY WITH PROPOFOL N/A 11/30/2015   Procedure: COLONOSCOPY WITH PROPOFOL;  Surgeon: Daneil Dolin, MD;  [facility];  Service: Endoscopy;  Laterality: N/A;   KNEE ARTHROSCOPY  [place] surgery - broken nose     POLYPECTOMY  11/30/2015   Procedure: POLYPECTOMY;  Surgeon: Daneil Dolin, MD;  [facility];  Service: Endoscopy;;  polyp at ascending colon, rectal polyp   TRANSURETHRAL INCISION OF PROSTATE N/A 01/20/2015   Procedure: TRANSURETHRAL INCISION OF THE PROSTATE (TUIP);  Surgeon: Irine Seal, MD;  Location[facility];  Service: Urology;  Laterality: N/A;    SH: Social History   Tobacco Use   Smoking status: Former    Packs/day: 1.50    Years: 14.00    Total pack years: 21.00    Types: Cigarettes    Quit date: 08/06/1975    Years since quitting: 46.5   Smokeless tobacco: Never  Vaping Use   Vaping Use: Never used  Substance Use Topics   Alcohol use: Not Currently    Alcohol/week: 3.0 standard drinks of alcohol    Types: 3 Cans of beer per week    Comment: 3 12 oz cans of beer each week.None since 03/2014   Drug use: No    ROS: Constitutional:  Negative for fever, chills, weight loss CV: Negative for chest pain, previous MI, hypertension Respiratory:  Negative for shortness of breath, wheezing, sleep apnea, frequent  cough GI:  Negative for nausea, vomiting, bloody stool, GERD  PE: BP 99/63   Pulse (!) 51   Ht '5\' 11"'$  (1.803 m)   Wt 169 lb (76.7 kg)   BMI 23.57 kg/m  GENERAL APPEARANCE:  Well appearing, well developed, well nourished, NAD HEENT:  Atraumatic, normocephalic, oropharynx clear NECK:  Supple without lymphadenopathy or thyromegaly ABDOMEN:  Soft, non-tender, no masses EXTREMITIES:  Moves all extremities well, without clubbing, cyanosis, or edema NEUROLOGIC:  Alert and oriented x 3, normal gait, CN II-XII grossly intact MENTAL STATUS:  appropriate BACK:  Non-tender to palpation, No CVAT SKIN:  Warm, dry, and intact GU: Prostate: 50 g, NT, no nodules Rectum: Normal tone,  no masses or tenderness   Results: U/A: Dipstick negative  PVR = 586 ml

## 2022-02-16 LAB — URINALYSIS, ROUTINE W REFLEX MICROSCOPIC
Bilirubin, UA: NEGATIVE
Glucose, UA: NEGATIVE
Ketones, UA: NEGATIVE
Leukocytes,UA: NEGATIVE
Nitrite, UA: NEGATIVE
Protein,UA: NEGATIVE
RBC, UA: NEGATIVE
Specific Gravity, UA: <1.005 — ABNORMAL LOW (ref 1.005–1.030)
Urobilinogen, Ur: 0.2 mg/dL (ref 0.2–1.0)
pH, UA: 5.5 (ref 5.0–7.5)

## 2022-03-01 ENCOUNTER — Ambulatory Visit: Payer: Medicare Other | Admitting: Urology

## 2022-03-02 DIAGNOSIS — E1165 Type 2 diabetes mellitus with hyperglycemia: Secondary | ICD-10-CM | POA: Diagnosis not present

## 2022-03-08 DIAGNOSIS — K219 Gastro-esophageal reflux disease without esophagitis: Secondary | ICD-10-CM | POA: Diagnosis not present

## 2022-03-08 DIAGNOSIS — K5900 Constipation, unspecified: Secondary | ICD-10-CM | POA: Diagnosis not present

## 2022-03-08 DIAGNOSIS — G629 Polyneuropathy, unspecified: Secondary | ICD-10-CM | POA: Diagnosis not present

## 2022-03-08 DIAGNOSIS — E785 Hyperlipidemia, unspecified: Secondary | ICD-10-CM | POA: Diagnosis not present

## 2022-03-08 DIAGNOSIS — E119 Type 2 diabetes mellitus without complications: Secondary | ICD-10-CM | POA: Diagnosis not present

## 2022-03-08 DIAGNOSIS — G4730 Sleep apnea, unspecified: Secondary | ICD-10-CM | POA: Diagnosis not present

## 2022-03-08 DIAGNOSIS — M1990 Unspecified osteoarthritis, unspecified site: Secondary | ICD-10-CM | POA: Diagnosis not present

## 2022-03-08 DIAGNOSIS — I2510 Atherosclerotic heart disease of native coronary artery without angina pectoris: Secondary | ICD-10-CM | POA: Diagnosis not present

## 2022-03-22 DIAGNOSIS — R0981 Nasal congestion: Secondary | ICD-10-CM | POA: Diagnosis not present

## 2022-03-22 DIAGNOSIS — R519 Headache, unspecified: Secondary | ICD-10-CM | POA: Diagnosis not present

## 2022-03-22 DIAGNOSIS — R058 Other specified cough: Secondary | ICD-10-CM | POA: Diagnosis not present

## 2022-03-25 DIAGNOSIS — F32A Depression, unspecified: Secondary | ICD-10-CM | POA: Diagnosis not present

## 2022-03-30 DIAGNOSIS — E785 Hyperlipidemia, unspecified: Secondary | ICD-10-CM | POA: Diagnosis not present

## 2022-03-30 DIAGNOSIS — I129 Hypertensive chronic kidney disease with stage 1 through stage 4 chronic kidney disease, or unspecified chronic kidney disease: Secondary | ICD-10-CM | POA: Diagnosis not present

## 2022-03-30 DIAGNOSIS — E119 Type 2 diabetes mellitus without complications: Secondary | ICD-10-CM | POA: Diagnosis not present

## 2022-03-30 DIAGNOSIS — E559 Vitamin D deficiency, unspecified: Secondary | ICD-10-CM | POA: Diagnosis not present

## 2022-03-30 DIAGNOSIS — E038 Other specified hypothyroidism: Secondary | ICD-10-CM | POA: Diagnosis not present

## 2022-03-30 DIAGNOSIS — M1990 Unspecified osteoarthritis, unspecified site: Secondary | ICD-10-CM | POA: Diagnosis not present

## 2022-03-30 DIAGNOSIS — E782 Mixed hyperlipidemia: Secondary | ICD-10-CM | POA: Diagnosis not present

## 2022-03-30 DIAGNOSIS — E1165 Type 2 diabetes mellitus with hyperglycemia: Secondary | ICD-10-CM | POA: Diagnosis not present

## 2022-03-30 DIAGNOSIS — I10 Essential (primary) hypertension: Secondary | ICD-10-CM | POA: Diagnosis not present

## 2022-03-30 DIAGNOSIS — Z79899 Other long term (current) drug therapy: Secondary | ICD-10-CM | POA: Diagnosis not present

## 2022-03-30 DIAGNOSIS — D518 Other vitamin B12 deficiency anemias: Secondary | ICD-10-CM | POA: Diagnosis not present

## 2022-03-30 DIAGNOSIS — E1100 Type 2 diabetes mellitus with hyperosmolarity without nonketotic hyperglycemic-hyperosmolar coma (NKHHC): Secondary | ICD-10-CM | POA: Diagnosis not present

## 2022-03-30 DIAGNOSIS — I2510 Atherosclerotic heart disease of native coronary artery without angina pectoris: Secondary | ICD-10-CM | POA: Diagnosis not present

## 2022-04-01 DIAGNOSIS — E1165 Type 2 diabetes mellitus with hyperglycemia: Secondary | ICD-10-CM | POA: Diagnosis not present

## 2022-04-12 DIAGNOSIS — I10 Essential (primary) hypertension: Secondary | ICD-10-CM | POA: Diagnosis not present

## 2022-04-12 DIAGNOSIS — N189 Chronic kidney disease, unspecified: Secondary | ICD-10-CM | POA: Diagnosis not present

## 2022-04-12 DIAGNOSIS — E785 Hyperlipidemia, unspecified: Secondary | ICD-10-CM | POA: Diagnosis not present

## 2022-04-12 DIAGNOSIS — K219 Gastro-esophageal reflux disease without esophagitis: Secondary | ICD-10-CM | POA: Diagnosis not present

## 2022-04-12 DIAGNOSIS — E119 Type 2 diabetes mellitus without complications: Secondary | ICD-10-CM | POA: Diagnosis not present

## 2022-04-12 DIAGNOSIS — M1990 Unspecified osteoarthritis, unspecified site: Secondary | ICD-10-CM | POA: Diagnosis not present

## 2022-04-12 DIAGNOSIS — I2510 Atherosclerotic heart disease of native coronary artery without angina pectoris: Secondary | ICD-10-CM | POA: Diagnosis not present

## 2022-04-12 DIAGNOSIS — G629 Polyneuropathy, unspecified: Secondary | ICD-10-CM | POA: Diagnosis not present

## 2022-04-26 DIAGNOSIS — I2510 Atherosclerotic heart disease of native coronary artery without angina pectoris: Secondary | ICD-10-CM | POA: Diagnosis not present

## 2022-04-26 DIAGNOSIS — E785 Hyperlipidemia, unspecified: Secondary | ICD-10-CM | POA: Diagnosis not present

## 2022-04-26 DIAGNOSIS — I10 Essential (primary) hypertension: Secondary | ICD-10-CM | POA: Diagnosis not present

## 2022-04-26 DIAGNOSIS — E559 Vitamin D deficiency, unspecified: Secondary | ICD-10-CM | POA: Diagnosis not present

## 2022-04-26 DIAGNOSIS — I129 Hypertensive chronic kidney disease with stage 1 through stage 4 chronic kidney disease, or unspecified chronic kidney disease: Secondary | ICD-10-CM | POA: Diagnosis not present

## 2022-04-26 DIAGNOSIS — E1100 Type 2 diabetes mellitus with hyperosmolarity without nonketotic hyperglycemic-hyperosmolar coma (NKHHC): Secondary | ICD-10-CM | POA: Diagnosis not present

## 2022-04-26 DIAGNOSIS — E038 Other specified hypothyroidism: Secondary | ICD-10-CM | POA: Diagnosis not present

## 2022-04-26 DIAGNOSIS — D518 Other vitamin B12 deficiency anemias: Secondary | ICD-10-CM | POA: Diagnosis not present

## 2022-04-26 DIAGNOSIS — E119 Type 2 diabetes mellitus without complications: Secondary | ICD-10-CM | POA: Diagnosis not present

## 2022-04-26 DIAGNOSIS — E1165 Type 2 diabetes mellitus with hyperglycemia: Secondary | ICD-10-CM | POA: Diagnosis not present

## 2022-04-26 DIAGNOSIS — M1990 Unspecified osteoarthritis, unspecified site: Secondary | ICD-10-CM | POA: Diagnosis not present

## 2022-05-02 DIAGNOSIS — E1165 Type 2 diabetes mellitus with hyperglycemia: Secondary | ICD-10-CM | POA: Diagnosis not present

## 2022-05-09 ENCOUNTER — Ambulatory Visit (INDEPENDENT_AMBULATORY_CARE_PROVIDER_SITE_OTHER): Payer: Medicare Other | Admitting: Urology

## 2022-05-09 ENCOUNTER — Encounter: Payer: Self-pay | Admitting: Urology

## 2022-05-09 VITALS — BP 125/72 | HR 49 | Ht 71.0 in | Wt 169.0 lb

## 2022-05-09 DIAGNOSIS — R339 Retention of urine, unspecified: Secondary | ICD-10-CM

## 2022-05-09 DIAGNOSIS — N138 Other obstructive and reflux uropathy: Secondary | ICD-10-CM | POA: Diagnosis not present

## 2022-05-09 DIAGNOSIS — N401 Enlarged prostate with lower urinary tract symptoms: Secondary | ICD-10-CM | POA: Diagnosis not present

## 2022-05-09 LAB — BLADDER SCAN AMB NON-IMAGING: Scan Result: 568

## 2022-05-09 MED ORDER — FINASTERIDE 5 MG PO TABS: 5.0000 mg | ORAL_TABLET | Freq: Every day | ORAL | 3 refills | Status: AC

## 2022-05-09 NOTE — Progress Notes (Signed)
post void residual =572m

## 2022-05-09 NOTE — Progress Notes (Signed)
05/09/2022 9:21 AM   Albert Garrison 1945/09/27 [id number]  Referring provider: Bonnita Nasuti, MD [address] [place],  [place] [address]  No chief complaint on file.   HPI:  New patient for me-  1) BPH-associated with incomplete bladder emptying and hypotonic bladder. Prostate was 109 g on renal US in 2023. He underwent a TURP in 2016 with Dr. Jeffie Pollock. He is on silodosin 8 mg and change from tamsulosin in 2023.   Episode of retention in 2022.  He passed a voiding trial. Postvoid residuals have been 300-800 range with AUASS = 19.   Urodynamics from 09/13/2021 showed a postvoid residual of 1300 mL, decreased bladder sensation with first sensation at 1000 mL, no instability, no leakage, no voluntary contraction and inability to void.   Today, AUASS = 18. On silodosin. PVR 568 ml. No painful inability to void. His DRE was normal 11/23.      PMH: Past Medical History:  Diagnosis Date   Arthritis    BPH (benign prostatic hyperplasia)    CKD (chronic kidney disease), stage II    Complicated UTI (urinary tract infection) 03/2014   Depression    GERD (gastroesophageal reflux disease)    History of pneumonia    History of stroke    HOH (hard of hearing)    Hypertension    Hyponatremia    Lower GI bleed 2017   a. ? due to polyp.   NSVT (nonsustained ventricular tachycardia) (HCC)    Rheumatic fever    Childhood   Sleep apnea    Does not use CPAP   Type 2 diabetes mellitus ([place])     Surgical History: Past Surgical History:  Procedure Laterality Date   CARPAL TUNNEL RELEASE Left 2010   CARPAL TUNNEL RELEASE Right 07/13/2017   Procedure: CARPAL TUNNEL RELEASE;  Surgeon: Carole Civil, MD;  Location: [facility];  Service: Orthopedics;  Laterality: Right;   CERVICAL SPINE SURGERY  2010   COLONOSCOPY WITH PROPOFOL N/A 11/30/2015   Procedure: COLONOSCOPY WITH PROPOFOL;  Surgeon: Daneil Dolin, MD;  [facility];  Service: Endoscopy;  Laterality: N/A;    KNEE ARTHROSCOPY  [place] surgery - broken nose     POLYPECTOMY  11/30/2015   Procedure: POLYPECTOMY;  Surgeon: Daneil Dolin, MD;  [facility]: [facility];  Service: Endoscopy;;  polyp at ascending colon, rectal polyp   TRANSURETHRAL INCISION OF PROSTATE N/A 01/20/2015   Procedure: TRANSURETHRAL INCISION OF THE PROSTATE (TUIP);  Surgeon: Irine Seal, MD;  Location: [facility];  Service: Urology;  Laterality: N/A;    Home Medications:  Allergies as of 05/09/2022   No Known Allergies      Medication List        Accurate as of May 09, 2022  9:21 AM. If you have any questions, ask your nurse or doctor.          amLODipine 10 MG tablet Commonly known as: NORVASC Take 1 tablet (10 mg total) by mouth daily.   aspirin 81 MG chewable tablet Chew 1 tablet (81 mg total) by mouth daily.   atorvastatin 40 MG tablet Commonly known as: LIPITOR Take 1 tablet (40 mg total) by mouth daily.   haloperidol 0.5 MG tablet Commonly known as: HALDOL Take 1 tablet (0.5 mg total) by mouth 2 (two) times daily.   HumaLOG KwikPen 100 UNIT/ML KwikPen Generic drug: insulin lispro Inject into the skin.   magnesium  oxide 400 (240 Mg) MG tablet Commonly known as: MAG-OX Take 1 tablet (400 mg total) by mouth daily.   memantine 10 MG tablet Commonly known as: NAMENDA Take 10 mg by mouth daily.   metFORMIN 500 MG tablet Commonly known as: GLUCOPHAGE Take 1 tablet (500 mg total) by mouth daily with breakfast.   metoprolol tartrate 25 MG tablet Commonly known as: LOPRESSOR Take 0.5 tablets (12.5 mg total) by mouth 2 (two) times daily.   mirtazapine 7.5 MG tablet Commonly known as: REMERON Take 7.5 mg by mouth at bedtime.   multivitamin with minerals Tabs tablet Take 1 tablet by mouth daily.   silodosin 8 MG Caps capsule Commonly known as: RAPAFLO Take 8 mg by mouth daily.        Allergies: No Known Allergies  Family History: Family History  Problem  Relation Age of Onset   Heart disease Sister    Hypertension Sister     Social History:  reports that he quit smoking about 46 years ago. His smoking use included cigarettes. He has a 21.00 pack-year smoking history. He has never used smokeless tobacco. He reports that he does not currently use alcohol after a past usage of about 3.0 standard drinks of alcohol per week. He reports that he does not use drugs.   Physical Exam: BP 125/72   Pulse (!) 49   Ht '5\' 11"'$  (1.803 m)   Wt 169 lb (76.7 kg)   BMI 23.57 kg/m   Constitutional:  Alert and oriented, No acute distress. HEENT: [place] AT, moist mucus membranes.  Trachea midline, no masses. Cardiovascular: No clubbing, cyanosis, or edema. Respiratory: Normal respiratory effort, no increased work of breathing. GI: Abdomen is soft, nontender, nondistended, no abdominal masses GU: No CVA tenderness Skin: No rashes, bruises or suspicious lesions. Neurologic: Grossly intact, no focal deficits, moving all 4 extremities. Psychiatric: Normal mood and affect.  Laboratory Data: Lab Results  Component Value Date   WBC 7.3 02/21/2021   HGB 13.0 02/21/2021   HCT 39.9 02/21/2021   MCV 92.6 02/21/2021   PLT 268 02/21/2021    Lab Results  Component Value Date   CREATININE 1.32 (H) 09/27/2021    No results found for: "PSA"  No results found for: "TESTOSTERONE"  Lab Results  Component Value Date   HGBA1C 8.6 (H) 11/27/2020    Urinalysis    Component Value Date/Time   COLORURINE YELLOW 02/21/2021 1135   APPEARANCEUR Clear 02/15/2022 1112   LABSPEC 1.006 02/21/2021 1135   PHURINE 6.0 02/21/2021 1135   GLUCOSEU Negative 02/15/2022 1112   HGBUR NEGATIVE 02/21/2021 1135   BILIRUBINUR Negative 02/15/2022 1112   KETONESUR NEGATIVE 02/21/2021 1135   PROTEINUR Negative 02/15/2022 1112   PROTEINUR NEGATIVE 02/21/2021 1135   UROBILINOGEN 0.2 03/24/2014 1320   NITRITE Negative 02/15/2022 1112   NITRITE NEGATIVE 02/21/2021 1135   LEUKOCYTESUR  Negative 02/15/2022 1112   LEUKOCYTESUR NEGATIVE 02/21/2021 1135    Lab Results  Component Value Date   LABMICR Comment 02/15/2022   WBCUA 6-10 (A) 01/26/2021   LABEPIT 0-10 01/26/2021   MUCUS Present 01/26/2021   BACTERIA Few 01/26/2021    Pertinent Imaging:  Results for orders placed during the hospital encounter of 11/27/20  DG Abd 1 View  Narrative CLINICAL DATA:  Bowel obstruction  EXAM: ABDOMEN - 1 VIEW  COMPARISON:  CT 11/29/2020  FINDINGS: Esophageal tube tip looped over the stomach. Persistent dilatation of small bowel measuring up to 5 cm consistent with bowel obstruction.  IMPRESSION:  Persistent dilatation of small bowel consistent with obstruction.   Electronically Signed By: Donavan Foil M.D. On: 11/30/2020 23:44  No results found for this or any previous visit.  No results found for this or any previous visit.  No results found for this or any previous visit.  Results for orders placed during the hospital encounter of 10/04/21  US RENAL  Narrative CLINICAL DATA:  BPH with obstruction.  EXAM: RENAL / URINARY TRACT ULTRASOUND COMPLETE  COMPARISON:  March 25, 2014  FINDINGS: Right Kidney:  Renal measurements: 9.8 cm x 5.4 cm x 4.9 cm = volume: 135.3 mL. Echogenicity within normal limits. A 0.6 cm x 0.7 cm x 0.9 cm cyst is seen along the upper pole of the right kidney. An additional 1.7 cm x 1.7 cm x 1.8 cm cyst is seen within the medial aspect of the mid to upper right kidney. No hydronephrosis is visualized.  Left Kidney:  Renal measurements: 9.7 cm x 5.6 cm x 4.5 cm = volume: 129.5 mL. Echogenicity within normal limits. No mass or hydronephrosis visualized.  Bladder:  Not voiding completely, as per the ultrasound technologist.  Other:  The prostate gland measures approximately 6.5 cm x 4.2 cm x 7.6 cm (109.4 mL).  IMPRESSION: 1. Small right renal cysts. These correspond to small renal cysts seen within the right  kidney on the prior abdomen pelvis CT (dated November 29, 2020). 2. Prostatomegaly.   Electronically Signed By: Virgina Norfolk M.D. On: 10/05/2021 19:27  No valid procedures specified. No results found for this or any previous visit.  No results found for this or any previous visit.   Assessment & Plan:    1. BPH with obstruction/lower urinary tract symptoms Continue silodosin. Disc nature r/b/a to finasteride and he will start. See back in 4 months for PVR.   - BLADDER SCAN AMB NON-IMAGING   No follow-ups on file.  Festus Aloe, MD  [facility]  [address] [place], [place] [address] [phone]

## 2022-05-17 DIAGNOSIS — F32A Depression, unspecified: Secondary | ICD-10-CM | POA: Diagnosis not present

## 2022-05-17 DIAGNOSIS — K219 Gastro-esophageal reflux disease without esophagitis: Secondary | ICD-10-CM | POA: Diagnosis not present

## 2022-05-17 DIAGNOSIS — G629 Polyneuropathy, unspecified: Secondary | ICD-10-CM | POA: Diagnosis not present

## 2022-05-17 DIAGNOSIS — I2510 Atherosclerotic heart disease of native coronary artery without angina pectoris: Secondary | ICD-10-CM | POA: Diagnosis not present

## 2022-05-17 DIAGNOSIS — E785 Hyperlipidemia, unspecified: Secondary | ICD-10-CM | POA: Diagnosis not present

## 2022-05-17 DIAGNOSIS — N189 Chronic kidney disease, unspecified: Secondary | ICD-10-CM | POA: Diagnosis not present

## 2022-05-17 DIAGNOSIS — I10 Essential (primary) hypertension: Secondary | ICD-10-CM | POA: Diagnosis not present

## 2022-05-19 DIAGNOSIS — D518 Other vitamin B12 deficiency anemias: Secondary | ICD-10-CM | POA: Diagnosis not present

## 2022-05-19 DIAGNOSIS — E038 Other specified hypothyroidism: Secondary | ICD-10-CM | POA: Diagnosis not present

## 2022-05-19 DIAGNOSIS — I129 Hypertensive chronic kidney disease with stage 1 through stage 4 chronic kidney disease, or unspecified chronic kidney disease: Secondary | ICD-10-CM | POA: Diagnosis not present

## 2022-05-19 DIAGNOSIS — E1165 Type 2 diabetes mellitus with hyperglycemia: Secondary | ICD-10-CM | POA: Diagnosis not present

## 2022-05-19 DIAGNOSIS — E785 Hyperlipidemia, unspecified: Secondary | ICD-10-CM | POA: Diagnosis not present

## 2022-05-19 DIAGNOSIS — E1100 Type 2 diabetes mellitus with hyperosmolarity without nonketotic hyperglycemic-hyperosmolar coma (NKHHC): Secondary | ICD-10-CM | POA: Diagnosis not present

## 2022-05-19 DIAGNOSIS — E559 Vitamin D deficiency, unspecified: Secondary | ICD-10-CM | POA: Diagnosis not present

## 2022-05-19 DIAGNOSIS — M1990 Unspecified osteoarthritis, unspecified site: Secondary | ICD-10-CM | POA: Diagnosis not present

## 2022-05-19 DIAGNOSIS — I10 Essential (primary) hypertension: Secondary | ICD-10-CM | POA: Diagnosis not present

## 2022-05-19 DIAGNOSIS — E119 Type 2 diabetes mellitus without complications: Secondary | ICD-10-CM | POA: Diagnosis not present

## 2022-05-19 DIAGNOSIS — I2510 Atherosclerotic heart disease of native coronary artery without angina pectoris: Secondary | ICD-10-CM | POA: Diagnosis not present

## 2022-06-01 DIAGNOSIS — M79672 Pain in left foot: Secondary | ICD-10-CM | POA: Diagnosis not present

## 2022-06-01 DIAGNOSIS — L600 Ingrowing nail: Secondary | ICD-10-CM | POA: Diagnosis not present

## 2022-06-01 DIAGNOSIS — M79671 Pain in right foot: Secondary | ICD-10-CM | POA: Diagnosis not present

## 2022-06-01 DIAGNOSIS — B351 Tinea unguium: Secondary | ICD-10-CM | POA: Diagnosis not present

## 2022-06-02 DIAGNOSIS — E1165 Type 2 diabetes mellitus with hyperglycemia: Secondary | ICD-10-CM | POA: Diagnosis not present

## 2022-06-16 DIAGNOSIS — E119 Type 2 diabetes mellitus without complications: Secondary | ICD-10-CM | POA: Diagnosis not present

## 2022-06-16 DIAGNOSIS — D518 Other vitamin B12 deficiency anemias: Secondary | ICD-10-CM | POA: Diagnosis not present

## 2022-06-16 DIAGNOSIS — E782 Mixed hyperlipidemia: Secondary | ICD-10-CM | POA: Diagnosis not present

## 2022-06-16 DIAGNOSIS — Z79899 Other long term (current) drug therapy: Secondary | ICD-10-CM | POA: Diagnosis not present

## 2022-06-16 DIAGNOSIS — E038 Other specified hypothyroidism: Secondary | ICD-10-CM | POA: Diagnosis not present

## 2022-06-21 DIAGNOSIS — I10 Essential (primary) hypertension: Secondary | ICD-10-CM | POA: Diagnosis not present

## 2022-06-21 DIAGNOSIS — G629 Polyneuropathy, unspecified: Secondary | ICD-10-CM | POA: Diagnosis not present

## 2022-06-21 DIAGNOSIS — E1100 Type 2 diabetes mellitus with hyperosmolarity without nonketotic hyperglycemic-hyperosmolar coma (NKHHC): Secondary | ICD-10-CM | POA: Diagnosis not present

## 2022-06-21 DIAGNOSIS — E785 Hyperlipidemia, unspecified: Secondary | ICD-10-CM | POA: Diagnosis not present

## 2022-06-21 DIAGNOSIS — E038 Other specified hypothyroidism: Secondary | ICD-10-CM | POA: Diagnosis not present

## 2022-06-21 DIAGNOSIS — E119 Type 2 diabetes mellitus without complications: Secondary | ICD-10-CM | POA: Diagnosis not present

## 2022-06-21 DIAGNOSIS — I2510 Atherosclerotic heart disease of native coronary artery without angina pectoris: Secondary | ICD-10-CM | POA: Diagnosis not present

## 2022-06-21 DIAGNOSIS — F32A Depression, unspecified: Secondary | ICD-10-CM | POA: Diagnosis not present

## 2022-06-21 DIAGNOSIS — E559 Vitamin D deficiency, unspecified: Secondary | ICD-10-CM | POA: Diagnosis not present

## 2022-06-21 DIAGNOSIS — E7849 Other hyperlipidemia: Secondary | ICD-10-CM | POA: Diagnosis not present

## 2022-06-21 DIAGNOSIS — M1990 Unspecified osteoarthritis, unspecified site: Secondary | ICD-10-CM | POA: Diagnosis not present

## 2022-06-21 DIAGNOSIS — E1165 Type 2 diabetes mellitus with hyperglycemia: Secondary | ICD-10-CM | POA: Diagnosis not present

## 2022-06-21 DIAGNOSIS — D518 Other vitamin B12 deficiency anemias: Secondary | ICD-10-CM | POA: Diagnosis not present

## 2022-06-21 DIAGNOSIS — N189 Chronic kidney disease, unspecified: Secondary | ICD-10-CM | POA: Diagnosis not present

## 2022-07-18 DIAGNOSIS — E1100 Type 2 diabetes mellitus with hyperosmolarity without nonketotic hyperglycemic-hyperosmolar coma (NKHHC): Secondary | ICD-10-CM | POA: Diagnosis not present

## 2022-07-18 DIAGNOSIS — E785 Hyperlipidemia, unspecified: Secondary | ICD-10-CM | POA: Diagnosis not present

## 2022-07-18 DIAGNOSIS — E038 Other specified hypothyroidism: Secondary | ICD-10-CM | POA: Diagnosis not present

## 2022-07-18 DIAGNOSIS — E559 Vitamin D deficiency, unspecified: Secondary | ICD-10-CM | POA: Diagnosis not present

## 2022-07-18 DIAGNOSIS — D518 Other vitamin B12 deficiency anemias: Secondary | ICD-10-CM | POA: Diagnosis not present

## 2022-07-18 DIAGNOSIS — I2510 Atherosclerotic heart disease of native coronary artery without angina pectoris: Secondary | ICD-10-CM | POA: Diagnosis not present

## 2022-07-18 DIAGNOSIS — I10 Essential (primary) hypertension: Secondary | ICD-10-CM | POA: Diagnosis not present

## 2022-07-18 DIAGNOSIS — E119 Type 2 diabetes mellitus without complications: Secondary | ICD-10-CM | POA: Diagnosis not present

## 2022-07-18 DIAGNOSIS — E1165 Type 2 diabetes mellitus with hyperglycemia: Secondary | ICD-10-CM | POA: Diagnosis not present

## 2022-07-18 DIAGNOSIS — E7849 Other hyperlipidemia: Secondary | ICD-10-CM | POA: Diagnosis not present

## 2022-07-19 DIAGNOSIS — E785 Hyperlipidemia, unspecified: Secondary | ICD-10-CM | POA: Diagnosis not present

## 2022-07-19 DIAGNOSIS — G629 Polyneuropathy, unspecified: Secondary | ICD-10-CM | POA: Diagnosis not present

## 2022-07-19 DIAGNOSIS — I2510 Atherosclerotic heart disease of native coronary artery without angina pectoris: Secondary | ICD-10-CM | POA: Diagnosis not present

## 2022-07-19 DIAGNOSIS — N189 Chronic kidney disease, unspecified: Secondary | ICD-10-CM | POA: Diagnosis not present

## 2022-07-19 DIAGNOSIS — M1990 Unspecified osteoarthritis, unspecified site: Secondary | ICD-10-CM | POA: Diagnosis not present

## 2022-07-19 DIAGNOSIS — E119 Type 2 diabetes mellitus without complications: Secondary | ICD-10-CM | POA: Diagnosis not present

## 2022-07-19 DIAGNOSIS — G4720 Circadian rhythm sleep disorder, unspecified type: Secondary | ICD-10-CM | POA: Diagnosis not present

## 2022-07-19 DIAGNOSIS — K219 Gastro-esophageal reflux disease without esophagitis: Secondary | ICD-10-CM | POA: Diagnosis not present

## 2022-08-01 DIAGNOSIS — E1165 Type 2 diabetes mellitus with hyperglycemia: Secondary | ICD-10-CM | POA: Diagnosis not present

## 2022-08-15 DIAGNOSIS — I2510 Atherosclerotic heart disease of native coronary artery without angina pectoris: Secondary | ICD-10-CM | POA: Diagnosis not present

## 2022-08-15 DIAGNOSIS — E038 Other specified hypothyroidism: Secondary | ICD-10-CM | POA: Diagnosis not present

## 2022-08-15 DIAGNOSIS — D518 Other vitamin B12 deficiency anemias: Secondary | ICD-10-CM | POA: Diagnosis not present

## 2022-08-15 DIAGNOSIS — E559 Vitamin D deficiency, unspecified: Secondary | ICD-10-CM | POA: Diagnosis not present

## 2022-08-15 DIAGNOSIS — E7849 Other hyperlipidemia: Secondary | ICD-10-CM | POA: Diagnosis not present

## 2022-08-15 DIAGNOSIS — I10 Essential (primary) hypertension: Secondary | ICD-10-CM | POA: Diagnosis not present

## 2022-08-15 DIAGNOSIS — E785 Hyperlipidemia, unspecified: Secondary | ICD-10-CM | POA: Diagnosis not present

## 2022-08-15 DIAGNOSIS — E119 Type 2 diabetes mellitus without complications: Secondary | ICD-10-CM | POA: Diagnosis not present

## 2022-08-15 DIAGNOSIS — E1165 Type 2 diabetes mellitus with hyperglycemia: Secondary | ICD-10-CM | POA: Diagnosis not present

## 2022-08-15 DIAGNOSIS — E1100 Type 2 diabetes mellitus with hyperosmolarity without nonketotic hyperglycemic-hyperosmolar coma (NKHHC): Secondary | ICD-10-CM | POA: Diagnosis not present

## 2022-08-16 DIAGNOSIS — I2510 Atherosclerotic heart disease of native coronary artery without angina pectoris: Secondary | ICD-10-CM | POA: Diagnosis not present

## 2022-08-16 DIAGNOSIS — E119 Type 2 diabetes mellitus without complications: Secondary | ICD-10-CM | POA: Diagnosis not present

## 2022-08-16 DIAGNOSIS — N189 Chronic kidney disease, unspecified: Secondary | ICD-10-CM | POA: Diagnosis not present

## 2022-08-16 DIAGNOSIS — I10 Essential (primary) hypertension: Secondary | ICD-10-CM | POA: Diagnosis not present

## 2022-08-16 DIAGNOSIS — G629 Polyneuropathy, unspecified: Secondary | ICD-10-CM | POA: Diagnosis not present

## 2022-08-16 DIAGNOSIS — E785 Hyperlipidemia, unspecified: Secondary | ICD-10-CM | POA: Diagnosis not present

## 2022-08-16 DIAGNOSIS — R011 Cardiac murmur, unspecified: Secondary | ICD-10-CM | POA: Diagnosis not present

## 2022-08-31 DIAGNOSIS — E1165 Type 2 diabetes mellitus with hyperglycemia: Secondary | ICD-10-CM | POA: Diagnosis not present

## 2022-09-05 ENCOUNTER — Ambulatory Visit (INDEPENDENT_AMBULATORY_CARE_PROVIDER_SITE_OTHER): Payer: Medicare Other | Admitting: Urology

## 2022-09-05 ENCOUNTER — Encounter: Payer: Self-pay | Admitting: Urology

## 2022-09-05 VITALS — BP 99/49 | HR 47

## 2022-09-05 DIAGNOSIS — R339 Retention of urine, unspecified: Secondary | ICD-10-CM

## 2022-09-05 DIAGNOSIS — N401 Enlarged prostate with lower urinary tract symptoms: Secondary | ICD-10-CM

## 2022-09-05 DIAGNOSIS — N138 Other obstructive and reflux uropathy: Secondary | ICD-10-CM | POA: Diagnosis not present

## 2022-09-05 LAB — BLADDER SCAN AMB NON-IMAGING

## 2022-09-05 LAB — URINALYSIS, ROUTINE W REFLEX MICROSCOPIC
Bilirubin, UA: NEGATIVE
Glucose, UA: NEGATIVE
Ketones, UA: NEGATIVE
Leukocytes,UA: NEGATIVE
Nitrite, UA: NEGATIVE
Protein,UA: NEGATIVE
RBC, UA: NEGATIVE
Specific Gravity, UA: <1.005 — ABNORMAL LOW (ref 1.005–1.030)
Urobilinogen, Ur: 0.2 mg/dL (ref 0.2–1.0)
pH, UA: 6 (ref 5.0–7.5)

## 2022-09-05 NOTE — Progress Notes (Unsigned)
09/05/2022 9:39 AM   Albert Garrison [birth year] [id number]  Referring provider: Galvin Proffer, MD [address] [place],  [place] [address]  Chief Complaint  Patient presents with   Benign Prostatic Hypertrophy    HPI: F/u -    1) BPH-associated with incomplete bladder emptying and hypotonic bladder. Prostate was 109 g on renal US in 2023 - no hydro. He underwent a TURP in 2016 with Dr. Annabell Howells. He is on silodosin 8 mg and change from tamsulosin in 2023.    Episode of retention in 2022.  He passed a voiding trial. Postvoid residuals have been 300-800 range with AUASS = 19.   NG risk includes CVA. He lives in a SNF.    Urodynamics from 09/13/2021 showed a postvoid residual of 1300 mL, decreased bladder sensation with first sensation at 1000 mL, no instability, no leakage, no voluntary contraction and inability to void.    F/u AUASS = 18. On silodosin. PVR 568 ml. No painful inability to void. His DRE was normal 11/23.   Today, seen for the above. He started finasteride Feb 2024. PVR today > 467 ml - lower. Here with aide. No voiding complaints.     PMH: Past Medical History:  Diagnosis Date   Arthritis    BPH (benign prostatic hyperplasia)    CKD (chronic kidney disease), stage II    Complicated UTI (urinary tract infection) 03/2014   Depression    GERD (gastroesophageal reflux disease)    History of pneumonia    History of stroke    HOH (hard of hearing)    Hypertension    Hyponatremia    Lower GI bleed 2017   a. ? due to polyp.   NSVT (nonsustained ventricular tachycardia) (HCC)    Rheumatic fever    Childhood   Sleep apnea    Does not use CPAP   Type 2 diabetes mellitus (HCC)     Surgical History: Past Surgical History:  Procedure Laterality Date   CARPAL TUNNEL RELEASE Left 2010   CARPAL TUNNEL RELEASE Right 07/13/2017   Procedure: CARPAL TUNNEL RELEASE;  Surgeon: Vickki Hearing, MD;  Location: [facility];  Service: Orthopedics;  Laterality: Right;    CERVICAL SPINE SURGERY  2010   COLONOSCOPY WITH PROPOFOL N/A 11/30/2015   Procedure: COLONOSCOPY WITH PROPOFOL;  Surgeon: Corbin Ade, MD;  [facility];  Service: Endoscopy;  Laterality: N/A;   KNEE ARTHROSCOPY  1989   LUMBAR LAMINECTOMY  1986   Nose surgery - broken nose     POLYPECTOMY  11/30/2015   Procedure: POLYPECTOMY;  Surgeon: Corbin Ade, MD;  [facility];  Service: Endoscopy;;  polyp at ascending colon, rectal polyp   TRANSURETHRAL INCISION OF PROSTATE N/A 01/20/2015   Procedure: TRANSURETHRAL INCISION OF THE PROSTATE (TUIP);  Surgeon: Bjorn Pippin, MD;  Location: [facility];  Service: Urology;  Laterality: N/A;    Home Medications:  Allergies as of 09/05/2022   No Known Allergies      Medication List        Accurate as of September 05, 2022  9:39 AM. If you have any questions, ask your nurse or doctor.          amLODipine 10 MG tablet Commonly known as: NORVASC Take 1 tablet (10 mg total) by mouth daily.   aspirin 81 MG chewable tablet Chew 1 tablet (81 mg total) by mouth daily.   atorvastatin 40 MG tablet Commonly known as: LIPITOR Take 1  tablet (40 mg total) by mouth daily.   finasteride 5 MG tablet Commonly known as: PROSCAR Take 1 tablet (5 mg total) by mouth daily.   haloperidol 0.5 MG tablet Commonly known as: HALDOL Take 1 tablet (0.5 mg total) by mouth 2 (two) times daily.   HumaLOG KwikPen 100 UNIT/ML KwikPen Generic drug: insulin lispro Inject into the skin.   magnesium oxide 400 (240 Mg) MG tablet Commonly known as: MAG-OX Take 1 tablet (400 mg total) by mouth daily.   memantine 10 MG tablet Commonly known as: NAMENDA Take 10 mg by mouth daily.   metFORMIN 500 MG tablet Commonly known as: GLUCOPHAGE Take 1 tablet (500 mg total) by mouth daily with breakfast. What changed: additional instructions   metoprolol tartrate 25 MG tablet Commonly known as: LOPRESSOR Take 0.5 tablets (12.5 mg total) by mouth 2 (two) times  daily.   mirtazapine 7.5 MG tablet Commonly known as: REMERON Take 7.5 mg by mouth at bedtime.   multivitamin with minerals Tabs tablet Take 1 tablet by mouth daily.   silodosin 8 MG Caps capsule Commonly known as: RAPAFLO Take 8 mg by mouth daily.        Allergies: No Known Allergies  Family History: Family History  Problem Relation Age of Onset   Heart disease Sister    Hypertension Sister     Social History:  reports that he quit smoking about 47 years ago. His smoking use included cigarettes. He has a 21.00 pack-year smoking history. He has never used smokeless tobacco. He reports that he does not currently use alcohol after a past usage of about 3.0 standard drinks of alcohol per week. He reports that he does not use drugs.   Physical Exam: BP (!) 99/49   Pulse (!) 47   Constitutional:  Alert and oriented, No acute distress. HEENT: [place] AT, moist mucus membranes.  Trachea midline, no masses. Cardiovascular: No clubbing, cyanosis, or edema. Respiratory: Normal respiratory effort, no increased work of breathing. GI: Abdomen is soft, nontender, nondistended, no abdominal masses GU: No CVA tenderness Skin: No rashes, bruises or suspicious lesions. Neurologic: Grossly intact, no focal deficits, moving all 4 extremities. Psychiatric: Normal mood and affect.  Laboratory Data: Lab Results  Component Value Date   WBC 7.3 02/21/2021   HGB 13.0 02/21/2021   HCT 39.9 02/21/2021   MCV 92.6 02/21/2021   PLT 268 02/21/2021    Lab Results  Component Value Date   CREATININE 1.32 (H) 09/27/2021    No results found for: "PSA"  No results found for: "TESTOSTERONE"  Lab Results  Component Value Date   HGBA1C 8.6 (H) 11/27/2020    Urinalysis    Component Value Date/Time   COLORURINE YELLOW 02/21/2021 1135   APPEARANCEUR Clear 02/15/2022 1112   LABSPEC 1.006 02/21/2021 1135   PHURINE 6.0 02/21/2021 1135   GLUCOSEU Negative 02/15/2022 1112   HGBUR NEGATIVE  02/21/2021 1135   BILIRUBINUR Negative 02/15/2022 1112   KETONESUR NEGATIVE 02/21/2021 1135   PROTEINUR Negative 02/15/2022 1112   PROTEINUR NEGATIVE 02/21/2021 1135   UROBILINOGEN 0.2 03/24/2014 1320   NITRITE Negative 02/15/2022 1112   NITRITE NEGATIVE 02/21/2021 1135   LEUKOCYTESUR Negative 02/15/2022 1112   LEUKOCYTESUR NEGATIVE 02/21/2021 1135    Lab Results  Component Value Date   LABMICR Comment 02/15/2022   WBCUA 6-10 (A) 01/26/2021   LABEPIT 0-10 01/26/2021   MUCUS Present 01/26/2021   BACTERIA Few 01/26/2021    Pertinent Imaging: Results for orders placed during the hospital  encounter of 11/27/20  DG Abd 1 View  Narrative CLINICAL DATA:  Bowel obstruction  EXAM: ABDOMEN - 1 VIEW  COMPARISON:  CT 11/29/2020  FINDINGS: Esophageal tube tip looped over the stomach. Persistent dilatation of small bowel measuring up to 5 cm consistent with bowel obstruction.  IMPRESSION: Persistent dilatation of small bowel consistent with obstruction.   Electronically Signed By: Jasmine Pang M.D. On: 11/30/2020 23:44  No results found for this or any previous visit.  No results found for this or any previous visit.  No results found for this or any previous visit.  Results for orders placed during the hospital encounter of 10/04/21  US RENAL  Narrative CLINICAL DATA:  BPH with obstruction.  EXAM: RENAL / URINARY TRACT ULTRASOUND COMPLETE  COMPARISON:  March 25, 2014  FINDINGS: Right Kidney:  Renal measurements: 9.8 cm x 5.4 cm x 4.9 cm = volume: 135.3 mL. Echogenicity within normal limits. A 0.6 cm x 0.7 cm x 0.9 cm cyst is seen along the upper pole of the right kidney. An additional 1.7 cm x 1.7 cm x 1.8 cm cyst is seen within the medial aspect of the mid to upper right kidney. No hydronephrosis is visualized.  Left Kidney:  Renal measurements: 9.7 cm x 5.6 cm x 4.5 cm = volume: 129.5 mL. Echogenicity within normal limits. No mass or  hydronephrosis visualized.  Bladder:  Not voiding completely, as per the ultrasound technologist.  Other:  The prostate gland measures approximately 6.5 cm x 4.2 cm x 7.6 cm (109.4 mL).  IMPRESSION: 1. Small right renal cysts. These correspond to small renal cysts seen within the right kidney on the prior abdomen pelvis CT (dated November 29, 2020). 2. Prostatomegaly.   Electronically Signed By: Aram Candela M.D. On: 10/05/2021 19:27  No valid procedures specified. No results found for this or any previous visit.  No results found for this or any previous visit.   Assessment & Plan:    1. BPH with obstruction/lower urinary tract symptoms PVr improved. No voiding complaints. Cont combo therapy - alpha blocker and 5ari.  - Urinalysis, Routine w reflex microscopic - Bladder Scan (Post Void Residual) in office   No follow-ups on file.  Jerilee Field, MD  [facility]  [address] [place], [place] [address] [phone]

## 2022-09-08 DIAGNOSIS — E119 Type 2 diabetes mellitus without complications: Secondary | ICD-10-CM | POA: Diagnosis not present

## 2022-09-08 DIAGNOSIS — E038 Other specified hypothyroidism: Secondary | ICD-10-CM | POA: Diagnosis not present

## 2022-09-08 DIAGNOSIS — Z79899 Other long term (current) drug therapy: Secondary | ICD-10-CM | POA: Diagnosis not present

## 2022-09-08 DIAGNOSIS — D518 Other vitamin B12 deficiency anemias: Secondary | ICD-10-CM | POA: Diagnosis not present

## 2022-09-08 DIAGNOSIS — E782 Mixed hyperlipidemia: Secondary | ICD-10-CM | POA: Diagnosis not present

## 2022-09-19 DIAGNOSIS — D518 Other vitamin B12 deficiency anemias: Secondary | ICD-10-CM | POA: Diagnosis not present

## 2022-09-19 DIAGNOSIS — I2510 Atherosclerotic heart disease of native coronary artery without angina pectoris: Secondary | ICD-10-CM | POA: Diagnosis not present

## 2022-09-19 DIAGNOSIS — E1100 Type 2 diabetes mellitus with hyperosmolarity without nonketotic hyperglycemic-hyperosmolar coma (NKHHC): Secondary | ICD-10-CM | POA: Diagnosis not present

## 2022-09-19 DIAGNOSIS — I10 Essential (primary) hypertension: Secondary | ICD-10-CM | POA: Diagnosis not present

## 2022-09-19 DIAGNOSIS — E038 Other specified hypothyroidism: Secondary | ICD-10-CM | POA: Diagnosis not present

## 2022-09-19 DIAGNOSIS — E559 Vitamin D deficiency, unspecified: Secondary | ICD-10-CM | POA: Diagnosis not present

## 2022-09-19 DIAGNOSIS — E7849 Other hyperlipidemia: Secondary | ICD-10-CM | POA: Diagnosis not present

## 2022-09-19 DIAGNOSIS — E119 Type 2 diabetes mellitus without complications: Secondary | ICD-10-CM | POA: Diagnosis not present

## 2022-09-19 DIAGNOSIS — E1165 Type 2 diabetes mellitus with hyperglycemia: Secondary | ICD-10-CM | POA: Diagnosis not present

## 2022-09-19 DIAGNOSIS — E785 Hyperlipidemia, unspecified: Secondary | ICD-10-CM | POA: Diagnosis not present

## 2022-09-20 DIAGNOSIS — G4720 Circadian rhythm sleep disorder, unspecified type: Secondary | ICD-10-CM | POA: Diagnosis not present

## 2022-09-20 DIAGNOSIS — K219 Gastro-esophageal reflux disease without esophagitis: Secondary | ICD-10-CM | POA: Diagnosis not present

## 2022-09-20 DIAGNOSIS — E785 Hyperlipidemia, unspecified: Secondary | ICD-10-CM | POA: Diagnosis not present

## 2022-09-20 DIAGNOSIS — I10 Essential (primary) hypertension: Secondary | ICD-10-CM | POA: Diagnosis not present

## 2022-09-20 DIAGNOSIS — N189 Chronic kidney disease, unspecified: Secondary | ICD-10-CM | POA: Diagnosis not present

## 2022-09-20 DIAGNOSIS — I2510 Atherosclerotic heart disease of native coronary artery without angina pectoris: Secondary | ICD-10-CM | POA: Diagnosis not present

## 2022-09-20 DIAGNOSIS — E119 Type 2 diabetes mellitus without complications: Secondary | ICD-10-CM | POA: Diagnosis not present

## 2022-09-20 DIAGNOSIS — F32A Depression, unspecified: Secondary | ICD-10-CM | POA: Diagnosis not present

## 2022-09-23 ENCOUNTER — Other Ambulatory Visit: Payer: Self-pay

## 2022-09-23 ENCOUNTER — Emergency Department (HOSPITAL_COMMUNITY)
Admission: EM | Admit: 2022-09-23 | Discharge: 2022-09-24 | Disposition: A | Discharge status: Home / Self Care | Payer: Medicare Other | Attending: Emergency Medicine | Admitting: Emergency Medicine

## 2022-09-23 ENCOUNTER — Encounter (HOSPITAL_COMMUNITY): Payer: Self-pay

## 2022-09-23 DIAGNOSIS — Z87891 Personal history of nicotine dependence: Secondary | ICD-10-CM | POA: Diagnosis not present

## 2022-09-23 DIAGNOSIS — R339 Retention of urine, unspecified: Secondary | ICD-10-CM | POA: Diagnosis not present

## 2022-09-23 DIAGNOSIS — E1122 Type 2 diabetes mellitus with diabetic chronic kidney disease: Secondary | ICD-10-CM | POA: Insufficient documentation

## 2022-09-23 DIAGNOSIS — F0390 Unspecified dementia without behavioral disturbance: Secondary | ICD-10-CM | POA: Insufficient documentation

## 2022-09-23 DIAGNOSIS — N182 Chronic kidney disease, stage 2 (mild): Secondary | ICD-10-CM | POA: Diagnosis not present

## 2022-09-23 DIAGNOSIS — Z79899 Other long term (current) drug therapy: Secondary | ICD-10-CM | POA: Diagnosis not present

## 2022-09-23 DIAGNOSIS — R6889 Other general symptoms and signs: Secondary | ICD-10-CM | POA: Diagnosis not present

## 2022-09-23 DIAGNOSIS — Z8673 Personal history of transient ischemic attack (TIA), and cerebral infarction without residual deficits: Secondary | ICD-10-CM | POA: Insufficient documentation

## 2022-09-23 DIAGNOSIS — I499 Cardiac arrhythmia, unspecified: Secondary | ICD-10-CM | POA: Diagnosis not present

## 2022-09-23 DIAGNOSIS — I129 Hypertensive chronic kidney disease with stage 1 through stage 4 chronic kidney disease, or unspecified chronic kidney disease: Secondary | ICD-10-CM | POA: Diagnosis not present

## 2022-09-23 DIAGNOSIS — Z7982 Long term (current) use of aspirin: Secondary | ICD-10-CM | POA: Insufficient documentation

## 2022-09-23 DIAGNOSIS — Z7984 Long term (current) use of oral hypoglycemic drugs: Secondary | ICD-10-CM | POA: Insufficient documentation

## 2022-09-23 DIAGNOSIS — Z743 Need for continuous supervision: Secondary | ICD-10-CM | POA: Diagnosis not present

## 2022-09-23 DIAGNOSIS — R531 Weakness: Secondary | ICD-10-CM | POA: Diagnosis not present

## 2022-09-23 DIAGNOSIS — Z794 Long term (current) use of insulin: Secondary | ICD-10-CM | POA: Diagnosis not present

## 2022-09-23 LAB — URINALYSIS, W/ REFLEX TO CULTURE (INFECTION SUSPECTED)
Bilirubin Urine: NEGATIVE
Glucose, UA: NEGATIVE mg/dL
Hgb urine dipstick: NEGATIVE
Ketones, ur: NEGATIVE mg/dL
Leukocytes,Ua: NEGATIVE
Nitrite: NEGATIVE
Protein, ur: NEGATIVE mg/dL
Specific Gravity, Urine: 1.004 — ABNORMAL LOW (ref 1.005–1.030)
pH: 6 (ref 5.0–8.0)

## 2022-09-23 LAB — BASIC METABOLIC PANEL
Anion gap: 8 (ref 5–15)
BUN: 16 mg/dL (ref 8–23)
CO2: 23 mmol/L (ref 22–32)
Calcium: 8.8 mg/dL — ABNORMAL LOW (ref 8.9–10.3)
Chloride: 100 mmol/L (ref 98–111)
Creatinine, Ser: 1.13 mg/dL (ref 0.61–1.24)
GFR, Estimated: >60 mL/min (ref >60)
Glucose, Bld: 125 mg/dL — ABNORMAL HIGH (ref 70–99)
Potassium: 3.7 mmol/L (ref 3.5–5.1)
Sodium: 131 mmol/L — ABNORMAL LOW (ref 135–145)

## 2022-09-23 LAB — CBC WITH DIFFERENTIAL/PLATELET
Abs Immature Granulocytes: 0.01 10*3/uL (ref 0.00–0.07)
Basophils Absolute: 0 10*3/uL (ref 0.0–0.1)
Basophils Relative: 0 %
Eosinophils Absolute: 0.1 10*3/uL (ref 0.0–0.5)
Eosinophils Relative: 1 %
HCT: 37.7 % — ABNORMAL LOW (ref 39.0–52.0)
Hemoglobin: 13.2 g/dL (ref 13.0–17.0)
Immature Granulocytes: 0 %
Lymphocytes Relative: 36 %
Lymphs Abs: 2.8 10*3/uL (ref 0.7–4.0)
MCH: 32.1 pg (ref 26.0–34.0)
MCHC: 35 g/dL (ref 30.0–36.0)
MCV: 91.7 fL (ref 80.0–100.0)
Monocytes Absolute: 0.8 10*3/uL (ref 0.1–1.0)
Monocytes Relative: 10 %
Neutro Abs: 4.1 10*3/uL (ref 1.7–7.7)
Neutrophils Relative %: 53 %
Platelets: 212 10*3/uL (ref 150–400)
RBC: 4.11 MIL/uL — ABNORMAL LOW (ref 4.22–5.81)
RDW: 12.7 % (ref 11.5–15.5)
WBC: 7.8 10*3/uL (ref 4.0–10.5)
nRBC: 0 % (ref 0.0–0.2)

## 2022-09-23 NOTE — ED Provider Notes (Signed)
[place] EMERGENCY DEPARTMENT AT [facility] Provider Note  CSN: [id number] Arrival date & time: 09/23/22 1840  Chief Complaint(s) Urinary Retention  HPI Albert Garrison is a 77 y.o. male history of dementia, BPH, CKD, prior UTI, diabetes presenting with urinary retention.  Patient sent from nursing facility out of concern for urinary retention.  Patient not sure if he has had any symptoms from this but does think he feels better after catheter was placed.  Denies any recent urinary symptoms.  History limited due to dementia.  Past Medical History Past Medical History:  Diagnosis Date   Arthritis    BPH (benign prostatic hyperplasia)    CKD (chronic kidney disease), stage II    Complicated UTI (urinary tract infection) 03/2014   Depression    GERD (gastroesophageal reflux disease)    History of pneumonia    History of stroke    HOH (hard of hearing)    Hypertension    Hyponatremia    Lower GI bleed 2017   a. ? due to polyp.   NSVT (nonsustained ventricular tachycardia) (HCC)    Rheumatic fever    Childhood   Sleep apnea    Does not use CPAP   Type 2 diabetes mellitus [facility])    Patient Active Problem List   Diagnosis Date Noted   Hypotonic bladder 09/27/2021   History of UTI 01/14/2021   Bacteremia due to Gram-positive bacteria 01/11/2021   Acute lower UTI 12/25/2020   Dysphagia 12/22/2020   Acute hypoxemic respiratory failure (HCC) 11/30/2020   Shock circulatory (HCC) 11/30/2020   Cardiac arrest (HCC) 11/30/2020   Other specified cardiac arrhythmias--- sleep apnea- arrhythmias when he desaturates  11/30/2020   Endotracheally intubated 11/30/2020   SBO (small bowel obstruction) (HCC) 11/28/2020   History of stroke 08/24/2017   Altered mental status 08/23/2017   Sleep apnea 08/23/2017   Elevated troponin 08/23/2017   Abnormal CT of the head 08/23/2017   Hypertension 08/23/2017   Type 2 diabetes mellitus (HCC) 08/23/2017   Syncope 08/23/2017   S/P carpal  tunnel release right 07/13/17 07/27/2017   AKI (acute kidney injury) (HCC) 11/29/2015   Lower GI bleed 11/28/2015   Rectal bleed 11/28/2015   Rectal bleeding 11/28/2015   BPH with obstruction/lower urinary tract symptoms 01/20/2015   Urinary retention 05/21/2014   ARF (acute renal failure) (HCC) 03/26/2014   Type 2 diabetes mellitus without complication, with long-term current use of insulin (HCC) 03/26/2014   UTI (lower urinary tract infection) 03/24/2014   Hyponatremia 03/24/2014   Acute encephalopathy 03/24/2014   Home Medication(s) Prior to Admission medications   Medication Sig Start Date End Date Taking? Authorizing Provider  amLODipine (NORVASC) 10 MG tablet Take 1 tablet (10 mg total) by mouth daily. 01/11/21   Catarina Hartshorn, MD  aspirin 81 MG chewable tablet Chew 1 tablet (81 mg total) by mouth daily. 01/11/21   Catarina Hartshorn, MD  atorvastatin (LIPITOR) 40 MG tablet Take 1 tablet (40 mg total) by mouth daily. 01/11/21   Catarina Hartshorn, MD  finasteride (PROSCAR) 5 MG tablet Take 1 tablet (5 mg total) by mouth daily. 05/09/22   Jerilee Field, MD  haloperidol (HALDOL) 0.5 MG tablet Take 1 tablet (0.5 mg total) by mouth 2 (two) times daily. 01/11/21 02/10/21  Catarina Hartshorn, MD  HUMALOG KWIKPEN 100 UNIT/ML KwikPen Inject into the skin. 11/22/21   [provider]  magnesium oxide (MAG-OX) 400 (240 Mg) MG tablet Take 1 tablet (400 mg total) by mouth daily. 01/12/21  Catarina Hartshorn, MD  memantine (NAMENDA) 10 MG tablet Take 10 mg by mouth daily. 11/23/21   [provider]  metFORMIN (GLUCOPHAGE) 500 MG tablet Take 1 tablet (500 mg total) by mouth daily with breakfast. Patient taking differently: Take 500 mg by mouth daily with breakfast. Pt taking bid 01/11/21   Tat, Onalee Hua, MD  metoprolol tartrate (LOPRESSOR) 25 MG tablet Take 0.5 tablets (12.5 mg total) by mouth 2 (two) times daily. 01/11/21   Catarina Hartshorn, MD  mirtazapine (REMERON) 7.5 MG tablet Take 7.5 mg by mouth at bedtime. 07/06/21    [provider]  Multiple Vitamin (MULTIVITAMIN WITH MINERALS) TABS tablet Take 1 tablet by mouth daily. 01/11/21   Catarina Hartshorn, MD  silodosin (RAPAFLO) 8 MG CAPS capsule Take 8 mg by mouth daily. 08/07/21   [provider]                                                                                                                                    Past Surgical History Past Surgical History:  Procedure Laterality Date   CARPAL TUNNEL RELEASE Left 2010   CARPAL TUNNEL RELEASE Right 07/13/2017   Procedure: CARPAL TUNNEL RELEASE;  Surgeon: Vickki Hearing, MD;  Location: [facility];  Service: Orthopedics;  Laterality: Right;   CERVICAL SPINE SURGERY  2010   COLONOSCOPY WITH PROPOFOL N/A 11/30/2015   Procedure: COLONOSCOPY WITH PROPOFOL;  Surgeon: Corbin Ade, MD;  Location: [facility];  Service: Endoscopy;  Laterality: N/A;   KNEE ARTHROSCOPY  1989   LUMBAR LAMINECTOMY  1986   Nose surgery - broken nose     POLYPECTOMY  11/30/2015   Procedure: POLYPECTOMY;  Surgeon: Corbin Ade, MD;  Location: [facility];  Service: Endoscopy;;  polyp at ascending colon, rectal polyp   TRANSURETHRAL INCISION OF PROSTATE N/A 01/20/2015   Procedure: TRANSURETHRAL INCISION OF THE PROSTATE (TUIP);  Surgeon: Bjorn Pippin, MD;  Location: [facility];  Service: Urology;  Laterality: N/A;   Family History Family History  Problem Relation Age of Onset   Heart disease Sister    Hypertension Sister     Social History Social History   Tobacco Use   Smoking status: Former    Packs/day: 1.50    Years: 14.00    Additional pack years: 0.00    Total pack years: 21.00    Types: Cigarettes    Quit date: 08/06/1975    Years since quitting: 47.1   Smokeless tobacco: Never  Vaping Use   Vaping Use: Never used  Substance Use Topics   Alcohol use: Not Currently    Alcohol/week: 3.0 standard drinks of alcohol    Types: 3 Cans of beer per week    Comment: 3 12 oz cans of beer each week.None since  03/2014   Drug use: No   Allergies Patient has no known allergies.  Review of Systems Review of Systems  All  other systems reviewed and are negative.   Physical Exam Vital Signs  I have reviewed the triage vital signs BP (!) 148/67   Pulse (!) 56   Temp 97.8 F (36.6 C) (Oral)   Resp 18   Ht 5\' 11"  (1.803 m)   Wt 77 kg   SpO2 96%   BMI 23.68 kg/m  Physical Exam Vitals and nursing note reviewed.  Constitutional:      General: He is not in acute distress.    Appearance: Normal appearance.  HENT:     Mouth/Throat:     Mouth: Mucous membranes are moist.  Eyes:     Conjunctiva/sclera: Conjunctivae normal.  Cardiovascular:     Rate and Rhythm: Normal rate and regular rhythm.  Pulmonary:     Effort: Pulmonary effort is normal. No respiratory distress.     Breath sounds: Normal breath sounds.  Abdominal:     General: Abdomen is flat.     Palpations: Abdomen is soft.     Tenderness: There is no abdominal tenderness.  Genitourinary:    Comments: Catheter in place draining yellow urine Musculoskeletal:     Right lower leg: No edema.     Left lower leg: No edema.  Skin:    General: Skin is warm and dry.     Capillary Refill: Capillary refill takes less than 2 seconds.  Neurological:     Mental Status: He is alert. Mental status is at baseline.     Comments: Patient oriented to self only  Psychiatric:        Mood and Affect: Mood normal.        Behavior: Behavior normal.     ED Results and Treatments Labs (all labs ordered are listed, but only abnormal results are displayed) Labs Reviewed  BASIC METABOLIC PANEL - Abnormal; Notable for the following components:      Result Value   Sodium 131 (*)    Glucose, Bld 125 (*)    Calcium 8.8 (*)    All other components within normal limits  CBC WITH DIFFERENTIAL/PLATELET - Abnormal; Notable for the following components:   RBC 4.11 (*)    HCT 37.7 (*)    All other components within normal limits  URINALYSIS, W/  REFLEX TO CULTURE (INFECTION SUSPECTED) - Abnormal; Notable for the following components:   Color, Urine STRAW (*)    Specific Gravity, Urine 1.004 (*)    Bacteria, UA RARE (*)    All other components within normal limits                                                                                                                          Radiology No results found.  Pertinent labs & imaging results that were available during my care of the patient were reviewed by me and considered in my medical decision making (see MDM for details).  Medications Ordered in ED Medications - No data to display  Procedures Procedures  (including critical care time)  Medical Decision Making / ED Course   MDM:  77 year old presenting to the emergency department with urinary retention at facility.  History limited due to dementia, bladder scan was performed by nursing with over 700 cc of urine so Foley was placed.  Patient thinks he feels better afterwards but unclear as patient is demented.  Urinalysis not concerning for UTI.  No sign of CKD.  Patient has seen urology in the past.  Advised urology follow-up.  Placed follow-up information on discharge instructions as likely his facility will need to arrange follow-up given his dementia. Will discharge back to facility. Return precautions listed on AVS      Additional history obtained: -Additional history obtained from ems -External records from outside source obtained and reviewed including: Chart review including previous notes, labs, imaging, consultation notes including urology notes   Lab Tests: -I ordered, reviewed, and interpreted labs.   The pertinent results include:   Labs Reviewed  BASIC METABOLIC PANEL - Abnormal; Notable for the following components:      Result Value   Sodium 131 (*)    Glucose, Bld  125 (*)    Calcium 8.8 (*)    All other components within normal limits  CBC WITH DIFFERENTIAL/PLATELET - Abnormal; Notable for the following components:   RBC 4.11 (*)    HCT 37.7 (*)    All other components within normal limits  URINALYSIS, W/ REFLEX TO CULTURE (INFECTION SUSPECTED) - Abnormal; Notable for the following components:   Color, Urine STRAW (*)    Specific Gravity, Urine 1.004 (*)    Bacteria, UA RARE (*)    All other components within normal limits    Notable for UA without signs of UTI e.g. leukocytes or nitrites, no leukocytosis, no AKI, mild hyponatremia  Medicines ordered and prescription drug management: No orders of the defined types were placed in this encounter.   -I have reviewed the patients home medicines and have made adjustments as needed   Social Determinants of Health:  Diagnosis or treatment significantly limited by social determinants of health: nursing home resident   Reevaluation: After the interventions noted above, I reevaluated the patient and found that their symptoms have improved  Co morbidities that complicate the patient evaluation  Past Medical History:  Diagnosis Date   Arthritis    BPH (benign prostatic hyperplasia)    CKD (chronic kidney disease), stage II    Complicated UTI (urinary tract infection) 03/2014   Depression    GERD (gastroesophageal reflux disease)    History of pneumonia    History of stroke    HOH (hard of hearing)    Hypertension    Hyponatremia    Lower GI bleed 2017   a. ? due to polyp.   NSVT (nonsustained ventricular tachycardia) (HCC)    Rheumatic fever    Childhood   Sleep apnea    Does not use CPAP   Type 2 diabetes mellitus (HCC)       Dispostion: Disposition decision including need for hospitalization was considered, and patient discharged from emergency department.    Final Clinical Impression(s) / ED Diagnoses Final diagnoses:  Urinary retention     This chart was dictated using  voice recognition software.  Despite best efforts to proofread,  errors can occur which can change the documentation meaning.    Lonell Grandchild, MD 09/23/22 2036

## 2022-09-23 NOTE — ED Triage Notes (Signed)
Pt arrived via [facility] from [facility]. Per [facility], the facility called to have Pt brought to [facility] for a bladder scan and possible Foley Insertion due to Pts doctor calling their facility and reporting the Pt has been dealing with incomplete bladder emptying. Pt has Hx of BPH, and UTI.

## 2022-09-24 NOTE — ED Notes (Signed)
Pt provided leg bag for his catheter prior to D/C back to his facility.

## 2022-09-26 DIAGNOSIS — M79672 Pain in left foot: Secondary | ICD-10-CM | POA: Diagnosis not present

## 2022-09-26 DIAGNOSIS — B351 Tinea unguium: Secondary | ICD-10-CM | POA: Diagnosis not present

## 2022-09-26 DIAGNOSIS — L600 Ingrowing nail: Secondary | ICD-10-CM | POA: Diagnosis not present

## 2022-09-26 DIAGNOSIS — M79671 Pain in right foot: Secondary | ICD-10-CM | POA: Diagnosis not present

## 2022-09-27 ENCOUNTER — Ambulatory Visit (INDEPENDENT_AMBULATORY_CARE_PROVIDER_SITE_OTHER): Payer: Medicare Other

## 2022-09-27 DIAGNOSIS — Z466 Encounter for fitting and adjustment of urinary device: Secondary | ICD-10-CM

## 2022-09-27 DIAGNOSIS — E871 Hypo-osmolality and hyponatremia: Secondary | ICD-10-CM | POA: Diagnosis not present

## 2022-09-27 DIAGNOSIS — R3916 Straining to void: Secondary | ICD-10-CM | POA: Diagnosis not present

## 2022-09-27 DIAGNOSIS — R339 Retention of urine, unspecified: Secondary | ICD-10-CM | POA: Diagnosis not present

## 2022-09-27 DIAGNOSIS — N182 Chronic kidney disease, stage 2 (mild): Secondary | ICD-10-CM | POA: Diagnosis not present

## 2022-09-27 NOTE — Progress Notes (Signed)
Patient presents today for catheter bag change.  Upon assessment catheter bag was full and there was no leak.  I instructed caregiver and patient on how to empty catheter bag.  I explained how bladder spasm can cause patient to leak around his catheter.  Patient and caregiver voiced understanding.

## 2022-10-01 DIAGNOSIS — E1165 Type 2 diabetes mellitus with hyperglycemia: Secondary | ICD-10-CM | POA: Diagnosis not present

## 2022-10-11 DIAGNOSIS — R339 Retention of urine, unspecified: Secondary | ICD-10-CM | POA: Diagnosis not present

## 2022-10-13 DIAGNOSIS — I10 Essential (primary) hypertension: Secondary | ICD-10-CM | POA: Diagnosis not present

## 2022-10-13 DIAGNOSIS — E1165 Type 2 diabetes mellitus with hyperglycemia: Secondary | ICD-10-CM | POA: Diagnosis not present

## 2022-10-13 DIAGNOSIS — E1100 Type 2 diabetes mellitus with hyperosmolarity without nonketotic hyperglycemic-hyperosmolar coma (NKHHC): Secondary | ICD-10-CM | POA: Diagnosis not present

## 2022-10-13 DIAGNOSIS — E7849 Other hyperlipidemia: Secondary | ICD-10-CM | POA: Diagnosis not present

## 2022-10-13 DIAGNOSIS — D518 Other vitamin B12 deficiency anemias: Secondary | ICD-10-CM | POA: Diagnosis not present

## 2022-10-13 DIAGNOSIS — E038 Other specified hypothyroidism: Secondary | ICD-10-CM | POA: Diagnosis not present

## 2022-10-13 DIAGNOSIS — E559 Vitamin D deficiency, unspecified: Secondary | ICD-10-CM | POA: Diagnosis not present

## 2022-10-13 DIAGNOSIS — E785 Hyperlipidemia, unspecified: Secondary | ICD-10-CM | POA: Diagnosis not present

## 2022-10-13 DIAGNOSIS — I2510 Atherosclerotic heart disease of native coronary artery without angina pectoris: Secondary | ICD-10-CM | POA: Diagnosis not present

## 2022-10-13 DIAGNOSIS — E119 Type 2 diabetes mellitus without complications: Secondary | ICD-10-CM | POA: Diagnosis not present

## 2022-10-14 DIAGNOSIS — Z87440 Personal history of urinary (tract) infections: Secondary | ICD-10-CM | POA: Diagnosis not present

## 2022-10-14 DIAGNOSIS — H9190 Unspecified hearing loss, unspecified ear: Secondary | ICD-10-CM | POA: Diagnosis not present

## 2022-10-14 DIAGNOSIS — I129 Hypertensive chronic kidney disease with stage 1 through stage 4 chronic kidney disease, or unspecified chronic kidney disease: Secondary | ICD-10-CM | POA: Diagnosis not present

## 2022-10-14 DIAGNOSIS — N138 Other obstructive and reflux uropathy: Secondary | ICD-10-CM | POA: Diagnosis not present

## 2022-10-14 DIAGNOSIS — E1140 Type 2 diabetes mellitus with diabetic neuropathy, unspecified: Secondary | ICD-10-CM | POA: Diagnosis not present

## 2022-10-14 DIAGNOSIS — M1990 Unspecified osteoarthritis, unspecified site: Secondary | ICD-10-CM | POA: Diagnosis not present

## 2022-10-14 DIAGNOSIS — G4730 Sleep apnea, unspecified: Secondary | ICD-10-CM | POA: Diagnosis not present

## 2022-10-14 DIAGNOSIS — F0153 Vascular dementia, unspecified severity, with mood disturbance: Secondary | ICD-10-CM | POA: Diagnosis not present

## 2022-10-14 DIAGNOSIS — R338 Other retention of urine: Secondary | ICD-10-CM | POA: Diagnosis not present

## 2022-10-14 DIAGNOSIS — F32A Depression, unspecified: Secondary | ICD-10-CM | POA: Diagnosis not present

## 2022-10-14 DIAGNOSIS — F0393 Unspecified dementia, unspecified severity, with mood disturbance: Secondary | ICD-10-CM | POA: Diagnosis not present

## 2022-10-14 DIAGNOSIS — Z794 Long term (current) use of insulin: Secondary | ICD-10-CM | POA: Diagnosis not present

## 2022-10-14 DIAGNOSIS — Z8673 Personal history of transient ischemic attack (TIA), and cerebral infarction without residual deficits: Secondary | ICD-10-CM | POA: Diagnosis not present

## 2022-10-14 DIAGNOSIS — N182 Chronic kidney disease, stage 2 (mild): Secondary | ICD-10-CM | POA: Diagnosis not present

## 2022-10-14 DIAGNOSIS — Z87891 Personal history of nicotine dependence: Secondary | ICD-10-CM | POA: Diagnosis not present

## 2022-10-14 DIAGNOSIS — Z7984 Long term (current) use of oral hypoglycemic drugs: Secondary | ICD-10-CM | POA: Diagnosis not present

## 2022-10-14 DIAGNOSIS — Z556 Problems related to health literacy: Secondary | ICD-10-CM | POA: Diagnosis not present

## 2022-10-14 DIAGNOSIS — Z466 Encounter for fitting and adjustment of urinary device: Secondary | ICD-10-CM | POA: Diagnosis not present

## 2022-10-14 DIAGNOSIS — E871 Hypo-osmolality and hyponatremia: Secondary | ICD-10-CM | POA: Diagnosis not present

## 2022-10-14 DIAGNOSIS — R1310 Dysphagia, unspecified: Secondary | ICD-10-CM | POA: Diagnosis not present

## 2022-10-14 DIAGNOSIS — I2510 Atherosclerotic heart disease of native coronary artery without angina pectoris: Secondary | ICD-10-CM | POA: Diagnosis not present

## 2022-10-14 DIAGNOSIS — J9601 Acute respiratory failure with hypoxia: Secondary | ICD-10-CM | POA: Diagnosis not present

## 2022-10-14 DIAGNOSIS — E1122 Type 2 diabetes mellitus with diabetic chronic kidney disease: Secondary | ICD-10-CM | POA: Diagnosis not present

## 2022-10-17 ENCOUNTER — Telehealth: Payer: Self-pay

## 2022-10-17 DIAGNOSIS — N319 Neuromuscular dysfunction of bladder, unspecified: Secondary | ICD-10-CM | POA: Insufficient documentation

## 2022-10-17 DIAGNOSIS — F0390 Unspecified dementia without behavioral disturbance: Secondary | ICD-10-CM | POA: Insufficient documentation

## 2022-10-17 DIAGNOSIS — Z8674 Personal history of sudden cardiac arrest: Secondary | ICD-10-CM | POA: Insufficient documentation

## 2022-10-17 NOTE — Telephone Encounter (Signed)
Brother of patient called to ask if he could be called for tomorrows visit.  He currently lives in [facility].  He is not well enough to drive.  Please advise.  Leanord Asal [phone]

## 2022-10-17 NOTE — Progress Notes (Unsigned)
Name: Albert Garrison DOB: 1945-06-27 MRN: [id number]  History of Present Illness: Albert Garrison is a 77 y.o. male who presents today for follow up visit at [facility]. He is accompanied by medical assistant Albert Garrison, who assisted with giving history due to patient's dementia. Patient's brother Albert Garrison (medical POA) was called to participate in today's visit via speaker phone; unable to reach him so a voicemail was left.  - GU history: 1. BPH.  - Prostate was 109 g on renal US in 2023.  - 01/20/2015: Underwent TUIP by Dr. Annabell Howells.  - Previously took Flomax.  - Taking daily Rapaflo 8 mg and Proscar (since February 2024). 2. Neurogenic hypotonic bladder with incomplete bladder emptying. - 09/13/2021: Urodynamic testing showed a postvoid residual of 1300 mL, decreased bladder sensation with first sensation at 1000 mL, no instability, no leakage, no voluntary contraction and inability to void.  - 10/04/2021: RUS negative for hydronephrosis. - Prior episode of urinary retention in 2022.  - Postvoid residuals have been 300-800 range with AUASS = 19.    At last visit with Dr. Mena Goes on 09/05/2022: - PVR >467; improved compared to prior. - The plan was to continue Rapaflo and Proscar.  Since last visit: 09/23/2022: Seen in ER for urinary retention.  - Bladder scan with >700 ml of urine so Foley catheter was placed.  - Urinalysis not concerning for UTI.  Today: He reports feeling well. He reports the catheter is draining clear yellow urine; denies gross hematuria.  He denies flank or abdominal pain.  He denies fevers.    Fall Screening: Do you usually have a device to assist in your mobility? No   Medications: Current Outpatient Medications  Medication Sig Dispense Refill   amLODipine (NORVASC) 10 MG tablet Take 1 tablet (10 mg total) by mouth daily. 30 tablet 1   aspirin 81 MG chewable tablet Chew 1 tablet (81 mg total) by mouth daily. 30 tablet 1   atorvastatin  (LIPITOR) 40 MG tablet Take 1 tablet (40 mg total) by mouth daily. 30 tablet 1   finasteride (PROSCAR) 5 MG tablet Take 1 tablet (5 mg total) by mouth daily. 90 tablet 3   HUMALOG KWIKPEN 100 UNIT/ML KwikPen Inject into the skin.     magnesium oxide (MAG-OX) 400 (240 Mg) MG tablet Take 1 tablet (400 mg total) by mouth daily. 30 tablet 1   memantine (NAMENDA) 10 MG tablet Take 10 mg by mouth daily.     metFORMIN (GLUCOPHAGE) 500 MG tablet Take 1 tablet (500 mg total) by mouth daily with breakfast. (Patient taking differently: Take 500 mg by mouth daily with breakfast. Pt taking bid) 30 tablet 1   metoprolol tartrate (LOPRESSOR) 25 MG tablet Take 0.5 tablets (12.5 mg total) by mouth 2 (two) times daily. 30 tablet 1   mirtazapine (REMERON) 7.5 MG tablet Take 7.5 mg by mouth at bedtime.     Multiple Vitamin (MULTIVITAMIN WITH MINERALS) TABS tablet Take 1 tablet by mouth daily. 30 tablet 1   silodosin (RAPAFLO) 8 MG CAPS capsule Take 8 mg by mouth daily.     haloperidol (HALDOL) 0.5 MG tablet Take 1 tablet (0.5 mg total) by mouth 2 (two) times daily. 60 tablet 1   No current facility-administered medications for this visit.    Allergies: No Known Allergies  Past Medical History:  Diagnosis Date   Arthritis    BPH (benign prostatic hyperplasia)    CKD (chronic kidney disease), stage II  Complicated UTI (urinary tract infection) 03/2014   Depression    GERD (gastroesophageal reflux disease)    History of pneumonia    History of stroke    HOH (hard of hearing)    Hypertension    Hyponatremia    Lower GI bleed 2017   a. ? due to polyp.   NSVT (nonsustained ventricular tachycardia) (HCC)    Rheumatic fever    Childhood   Sleep apnea    Does not use CPAP   Type 2 diabetes mellitus (HCC)    Past Surgical History:  Procedure Laterality Date   CARPAL TUNNEL RELEASE Left 2010   CARPAL TUNNEL RELEASE Right 07/13/2017   Procedure: CARPAL TUNNEL RELEASE;  Surgeon: Vickki Hearing, MD;   Location: [facility];  Service: Orthopedics;  Laterality: Right;   CERVICAL SPINE SURGERY  2010   COLONOSCOPY WITH PROPOFOL N/A 11/30/2015   Procedure: COLONOSCOPY WITH PROPOFOL;  Surgeon: Corbin Ade, MD;  [facility]: [facility];  Service: Endoscopy;  Laterality: N/A;   KNEE ARTHROSCOPY  1989   LUMBAR LAMINECTOMY  1986   Nose surgery - broken nose     POLYPECTOMY  11/30/2015   Procedure: POLYPECTOMY;  Surgeon: Corbin Ade, MD;  [facility];  Service: Endoscopy;;  polyp at ascending colon, rectal polyp   TRANSURETHRAL INCISION OF PROSTATE N/A 01/20/2015   Procedure: TRANSURETHRAL INCISION OF THE PROSTATE (TUIP);  Surgeon: Bjorn Pippin, MD;  Location: [facility];  Service: Urology;  Laterality: N/A;   Family History  Problem Relation Age of Onset   Heart disease Sister    Hypertension Sister    Social History   Socioeconomic History   Marital status: Divorced    Spouse name: Not on file   Number of children: Not on file   Years of education: Not on file   Highest education level: Not on file  Occupational History   Not on file  Tobacco Use   Smoking status: Former    Current packs/day: 0.00    Average packs/day: 1.5 packs/day for 14.0 years (21.0 ttl pk-yrs)    Types: Cigarettes    Start date: 08/05/1961    Quit date: 08/06/1975    Years since quitting: 47.2   Smokeless tobacco: Never  Vaping Use   Vaping status: Never Used  Substance and Sexual Activity   Alcohol use: Not Currently    Alcohol/week: 3.0 standard drinks of alcohol    Types: 3 Cans of beer per week    Comment: 3 12 oz cans of beer each week.None since 03/2014   Drug use: No   Sexual activity: Yes    Birth control/protection: None  Other Topics Concern   Not on file  Social History Narrative   Not on file   Social Determinants of Health   Financial Resource Strain: Not on file  Food Insecurity: Not on file  Transportation Needs: Not on file  Physical Activity: Not on file  Stress: Not on file   Social Connections: Not on file  Intimate Partner Violence: Not on file    Review of Systems - limited due to dementia Constitutional: Patient denies any unintentional weight loss or change in strength lntegumentary: Patient denies any rashes or pruritus Cardiovascular: Patient denies chest pain or syncope Respiratory: Patient denies shortness of breath  GU: As per HPI.  OBJECTIVE Vitals:   10/18/22 0829  BP: 122/71  Pulse: 70  Temp: 98.3 F (36.8 C)   There is no height or weight  on file to calculate BMI.  Physical Examination  Constitutional: No obvious distress; patient is non-toxic appearing  Cardiovascular: No visible lower extremity edema.  Respiratory: The patient does not have audible wheezing/stridor; respirations do not appear labored  Gastrointestinal: Abdomen non-distended Musculoskeletal: Normal ROM of UEs  Skin: No obvious rashes/open sores  Neurologic: CN 2-12 grossly intact Psychiatric: Pleasant affect; dementia Hematologic/Lymphatic/Immunologic: No obvious bruises or sites of spontaneous bleeding  GU: Catheter draining clear yellow urine.  ASSESSMENT Urinary retention - Plan: INSERT,TEMP INDWELLING BLAD CATH,SIMPLE  Foley catheter in place - Plan: INSERT,TEMP INDWELLING BLAD CATH,SIMPLE  Neurogenic bladder - Plan: INSERT,TEMP INDWELLING BLAD CATH,SIMPLE  Hypotonic bladder - Plan: INSERT,TEMP INDWELLING BLAD CATH,SIMPLE  BPH with obstruction/lower urinary tract symptoms - Plan: INSERT,TEMP INDWELLING BLAD CATH,SIMPLE  History of UTI - Plan: INSERT,TEMP INDWELLING BLAD CATH,SIMPLE  Dementia, unspecified dementia severity, unspecified dementia type, unspecified whether behavioral, psychotic, or mood disturbance or anxiety (HCC) - Plan: INSERT,TEMP INDWELLING BLAD CATH,SIMPLE  Neurogenic bladder with chronic urinary retention:  We discussed risk related to chronic urinary retention / incomplete bladder emptying including but not limited to: bladder  discomfort I pain, overflow urinary incontinence (which can result in skin breakdown / wounds), recurrent UTls, pyelonephritis, urosepsis, kidney damage/ failure, decreased kidney function requiring dialysis.  He is not a candidate for clean intermittent catheterization (CIC) due to his dementia.   - Options to address chronic urinary retention include: A) indwelling Foley catheter B) suprapubic (SP) tube/catheter  Will plan to continue with indwelling Foley catheter indefinitely at this time. He is due for catheter exchange today, which was performed by nursing staff. Orders placed for home health nurses to exchange catheter once every 4 weeks.   Will plan for follow up in 6 months or sooner if needed. Pt verbalized understanding and agreement. All questions were answered.  PLAN Advised the following: 1. Continue indwelling Foley catheter.  2. Orders placed for home health nurses to exchange catheter once every 4 weeks.  3. Return in about 6 months (around 04/20/2023) for f/u with Evette Georges, NP.  Orders Placed This Encounter  Procedures   INSERT,TEMP INDWELLING BLAD CATH,SIMPLE   Total time spent caring for the patient today was over 30 minutes. This includes time spent on the date of the visit reviewing the patient's chart before the visit, time spent during the visit, and time spent after the visit on documentation. Over 50% of that time was spent in face-to-face time with this patient for direct counseling. E&M based on time and complexity of medical decision making.  It has been explained that the patient is to follow regularly with their PCP in addition to all other providers involved in their care and to follow instructions provided by these respective offices. Patient advised to contact urology clinic if any urologic-pertaining questions, concerns, new symptoms or problems arise in the interim period.  There are no Patient Instructions on file for this visit.  Electronically  signed by:  Donnita Falls, FNP   10/18/22    9:05 AM

## 2022-10-18 ENCOUNTER — Ambulatory Visit (INDEPENDENT_AMBULATORY_CARE_PROVIDER_SITE_OTHER): Payer: Medicare Other | Admitting: Urology

## 2022-10-18 ENCOUNTER — Encounter: Payer: Self-pay | Admitting: Urology

## 2022-10-18 VITALS — BP 122/71 | HR 70 | Temp 98.3°F

## 2022-10-18 DIAGNOSIS — F32A Depression, unspecified: Secondary | ICD-10-CM | POA: Diagnosis not present

## 2022-10-18 DIAGNOSIS — I2510 Atherosclerotic heart disease of native coronary artery without angina pectoris: Secondary | ICD-10-CM | POA: Diagnosis not present

## 2022-10-18 DIAGNOSIS — N312 Flaccid neuropathic bladder, not elsewhere classified: Secondary | ICD-10-CM

## 2022-10-18 DIAGNOSIS — N319 Neuromuscular dysfunction of bladder, unspecified: Secondary | ICD-10-CM

## 2022-10-18 DIAGNOSIS — Z87440 Personal history of urinary (tract) infections: Secondary | ICD-10-CM

## 2022-10-18 DIAGNOSIS — Z978 Presence of other specified devices: Secondary | ICD-10-CM

## 2022-10-18 DIAGNOSIS — M1990 Unspecified osteoarthritis, unspecified site: Secondary | ICD-10-CM | POA: Diagnosis not present

## 2022-10-18 DIAGNOSIS — G629 Polyneuropathy, unspecified: Secondary | ICD-10-CM | POA: Diagnosis not present

## 2022-10-18 DIAGNOSIS — E785 Hyperlipidemia, unspecified: Secondary | ICD-10-CM | POA: Diagnosis not present

## 2022-10-18 DIAGNOSIS — G4730 Sleep apnea, unspecified: Secondary | ICD-10-CM | POA: Diagnosis not present

## 2022-10-18 DIAGNOSIS — R339 Retention of urine, unspecified: Secondary | ICD-10-CM

## 2022-10-18 DIAGNOSIS — N138 Other obstructive and reflux uropathy: Secondary | ICD-10-CM

## 2022-10-18 DIAGNOSIS — N401 Enlarged prostate with lower urinary tract symptoms: Secondary | ICD-10-CM

## 2022-10-18 DIAGNOSIS — F0390 Unspecified dementia without behavioral disturbance: Secondary | ICD-10-CM

## 2022-10-18 DIAGNOSIS — N182 Chronic kidney disease, stage 2 (mild): Secondary | ICD-10-CM | POA: Diagnosis not present

## 2022-10-18 DIAGNOSIS — E119 Type 2 diabetes mellitus without complications: Secondary | ICD-10-CM | POA: Diagnosis not present

## 2022-10-18 DIAGNOSIS — K219 Gastro-esophageal reflux disease without esophagitis: Secondary | ICD-10-CM | POA: Diagnosis not present

## 2022-10-18 DIAGNOSIS — I10 Essential (primary) hypertension: Secondary | ICD-10-CM | POA: Diagnosis not present

## 2022-10-18 NOTE — Progress Notes (Signed)
Cath Change/ Replacement  Patient is present today for a catheter change due to urinary retention.  10ml of water was removed from the balloon, a 16 FR foley cath was removed without difficulty.  Patient was cleaned and prepped in a sterile fashion with betadine . A 16 FR foley cath was replaced into the bladder, no complications were noted. Urine return was noted 10 ml and urine was dark yellow in color. The balloon was filled with 10ml of sterile water. A le bag was attached for drainage.  A night bag was also given to the patient and patient was given instruction on how to change from one bag to another. Patient was given proper instruction on catheter care.    Performed by: Kennyth Lose, CMA  Follow up: Return in about 6 months (around 04/20/2023) for f/u with Evette Georges, NP.

## 2022-10-19 DIAGNOSIS — R059 Cough, unspecified: Secondary | ICD-10-CM | POA: Diagnosis not present

## 2022-10-19 DIAGNOSIS — R339 Retention of urine, unspecified: Secondary | ICD-10-CM | POA: Diagnosis not present

## 2022-10-21 DIAGNOSIS — M1990 Unspecified osteoarthritis, unspecified site: Secondary | ICD-10-CM | POA: Diagnosis not present

## 2022-10-21 DIAGNOSIS — I2510 Atherosclerotic heart disease of native coronary artery without angina pectoris: Secondary | ICD-10-CM | POA: Diagnosis not present

## 2022-10-21 DIAGNOSIS — J9601 Acute respiratory failure with hypoxia: Secondary | ICD-10-CM | POA: Diagnosis not present

## 2022-10-21 DIAGNOSIS — G4730 Sleep apnea, unspecified: Secondary | ICD-10-CM | POA: Diagnosis not present

## 2022-10-21 DIAGNOSIS — I129 Hypertensive chronic kidney disease with stage 1 through stage 4 chronic kidney disease, or unspecified chronic kidney disease: Secondary | ICD-10-CM | POA: Diagnosis not present

## 2022-10-21 DIAGNOSIS — F0153 Vascular dementia, unspecified severity, with mood disturbance: Secondary | ICD-10-CM | POA: Diagnosis not present

## 2022-10-21 DIAGNOSIS — N182 Chronic kidney disease, stage 2 (mild): Secondary | ICD-10-CM | POA: Diagnosis not present

## 2022-10-21 DIAGNOSIS — Z8673 Personal history of transient ischemic attack (TIA), and cerebral infarction without residual deficits: Secondary | ICD-10-CM | POA: Diagnosis not present

## 2022-10-21 DIAGNOSIS — F0393 Unspecified dementia, unspecified severity, with mood disturbance: Secondary | ICD-10-CM | POA: Diagnosis not present

## 2022-10-21 DIAGNOSIS — Z7984 Long term (current) use of oral hypoglycemic drugs: Secondary | ICD-10-CM | POA: Diagnosis not present

## 2022-10-21 DIAGNOSIS — H9190 Unspecified hearing loss, unspecified ear: Secondary | ICD-10-CM | POA: Diagnosis not present

## 2022-10-21 DIAGNOSIS — Z87891 Personal history of nicotine dependence: Secondary | ICD-10-CM | POA: Diagnosis not present

## 2022-10-21 DIAGNOSIS — R338 Other retention of urine: Secondary | ICD-10-CM | POA: Diagnosis not present

## 2022-10-21 DIAGNOSIS — Z87440 Personal history of urinary (tract) infections: Secondary | ICD-10-CM | POA: Diagnosis not present

## 2022-10-21 DIAGNOSIS — E1140 Type 2 diabetes mellitus with diabetic neuropathy, unspecified: Secondary | ICD-10-CM | POA: Diagnosis not present

## 2022-10-21 DIAGNOSIS — N138 Other obstructive and reflux uropathy: Secondary | ICD-10-CM | POA: Diagnosis not present

## 2022-10-21 DIAGNOSIS — E871 Hypo-osmolality and hyponatremia: Secondary | ICD-10-CM | POA: Diagnosis not present

## 2022-10-21 DIAGNOSIS — Z556 Problems related to health literacy: Secondary | ICD-10-CM | POA: Diagnosis not present

## 2022-10-21 DIAGNOSIS — Z466 Encounter for fitting and adjustment of urinary device: Secondary | ICD-10-CM | POA: Diagnosis not present

## 2022-10-21 DIAGNOSIS — F32A Depression, unspecified: Secondary | ICD-10-CM | POA: Diagnosis not present

## 2022-10-21 DIAGNOSIS — Z794 Long term (current) use of insulin: Secondary | ICD-10-CM | POA: Diagnosis not present

## 2022-10-21 DIAGNOSIS — R1310 Dysphagia, unspecified: Secondary | ICD-10-CM | POA: Diagnosis not present

## 2022-10-21 DIAGNOSIS — E1122 Type 2 diabetes mellitus with diabetic chronic kidney disease: Secondary | ICD-10-CM | POA: Diagnosis not present

## 2022-10-25 DIAGNOSIS — Z0189 Encounter for other specified special examinations: Secondary | ICD-10-CM | POA: Diagnosis not present

## 2022-10-28 DIAGNOSIS — H9190 Unspecified hearing loss, unspecified ear: Secondary | ICD-10-CM | POA: Diagnosis not present

## 2022-10-28 DIAGNOSIS — F32A Depression, unspecified: Secondary | ICD-10-CM | POA: Diagnosis not present

## 2022-10-28 DIAGNOSIS — Z466 Encounter for fitting and adjustment of urinary device: Secondary | ICD-10-CM | POA: Diagnosis not present

## 2022-10-28 DIAGNOSIS — J9601 Acute respiratory failure with hypoxia: Secondary | ICD-10-CM | POA: Diagnosis not present

## 2022-10-28 DIAGNOSIS — Z87440 Personal history of urinary (tract) infections: Secondary | ICD-10-CM | POA: Diagnosis not present

## 2022-10-28 DIAGNOSIS — Z8673 Personal history of transient ischemic attack (TIA), and cerebral infarction without residual deficits: Secondary | ICD-10-CM | POA: Diagnosis not present

## 2022-10-28 DIAGNOSIS — G4730 Sleep apnea, unspecified: Secondary | ICD-10-CM | POA: Diagnosis not present

## 2022-10-28 DIAGNOSIS — Z794 Long term (current) use of insulin: Secondary | ICD-10-CM | POA: Diagnosis not present

## 2022-10-28 DIAGNOSIS — I129 Hypertensive chronic kidney disease with stage 1 through stage 4 chronic kidney disease, or unspecified chronic kidney disease: Secondary | ICD-10-CM | POA: Diagnosis not present

## 2022-10-28 DIAGNOSIS — Z87891 Personal history of nicotine dependence: Secondary | ICD-10-CM | POA: Diagnosis not present

## 2022-10-28 DIAGNOSIS — N182 Chronic kidney disease, stage 2 (mild): Secondary | ICD-10-CM | POA: Diagnosis not present

## 2022-10-28 DIAGNOSIS — M1990 Unspecified osteoarthritis, unspecified site: Secondary | ICD-10-CM | POA: Diagnosis not present

## 2022-10-28 DIAGNOSIS — I2510 Atherosclerotic heart disease of native coronary artery without angina pectoris: Secondary | ICD-10-CM | POA: Diagnosis not present

## 2022-10-28 DIAGNOSIS — R338 Other retention of urine: Secondary | ICD-10-CM | POA: Diagnosis not present

## 2022-10-28 DIAGNOSIS — F0393 Unspecified dementia, unspecified severity, with mood disturbance: Secondary | ICD-10-CM | POA: Diagnosis not present

## 2022-10-28 DIAGNOSIS — Z556 Problems related to health literacy: Secondary | ICD-10-CM | POA: Diagnosis not present

## 2022-10-28 DIAGNOSIS — N138 Other obstructive and reflux uropathy: Secondary | ICD-10-CM | POA: Diagnosis not present

## 2022-10-28 DIAGNOSIS — E1122 Type 2 diabetes mellitus with diabetic chronic kidney disease: Secondary | ICD-10-CM | POA: Diagnosis not present

## 2022-10-28 DIAGNOSIS — E1140 Type 2 diabetes mellitus with diabetic neuropathy, unspecified: Secondary | ICD-10-CM | POA: Diagnosis not present

## 2022-10-28 DIAGNOSIS — E871 Hypo-osmolality and hyponatremia: Secondary | ICD-10-CM | POA: Diagnosis not present

## 2022-10-28 DIAGNOSIS — Z7984 Long term (current) use of oral hypoglycemic drugs: Secondary | ICD-10-CM | POA: Diagnosis not present

## 2022-10-28 DIAGNOSIS — R1310 Dysphagia, unspecified: Secondary | ICD-10-CM | POA: Diagnosis not present

## 2022-10-28 DIAGNOSIS — F0153 Vascular dementia, unspecified severity, with mood disturbance: Secondary | ICD-10-CM | POA: Diagnosis not present

## 2022-10-31 DIAGNOSIS — E1165 Type 2 diabetes mellitus with hyperglycemia: Secondary | ICD-10-CM | POA: Diagnosis not present

## 2022-11-03 DIAGNOSIS — N138 Other obstructive and reflux uropathy: Secondary | ICD-10-CM | POA: Diagnosis not present

## 2022-11-03 DIAGNOSIS — Z466 Encounter for fitting and adjustment of urinary device: Secondary | ICD-10-CM | POA: Diagnosis not present

## 2022-11-03 DIAGNOSIS — Z794 Long term (current) use of insulin: Secondary | ICD-10-CM | POA: Diagnosis not present

## 2022-11-03 DIAGNOSIS — N182 Chronic kidney disease, stage 2 (mild): Secondary | ICD-10-CM | POA: Diagnosis not present

## 2022-11-03 DIAGNOSIS — F0393 Unspecified dementia, unspecified severity, with mood disturbance: Secondary | ICD-10-CM | POA: Diagnosis not present

## 2022-11-03 DIAGNOSIS — R1310 Dysphagia, unspecified: Secondary | ICD-10-CM | POA: Diagnosis not present

## 2022-11-03 DIAGNOSIS — J9601 Acute respiratory failure with hypoxia: Secondary | ICD-10-CM | POA: Diagnosis not present

## 2022-11-03 DIAGNOSIS — G4730 Sleep apnea, unspecified: Secondary | ICD-10-CM | POA: Diagnosis not present

## 2022-11-03 DIAGNOSIS — Z7984 Long term (current) use of oral hypoglycemic drugs: Secondary | ICD-10-CM | POA: Diagnosis not present

## 2022-11-03 DIAGNOSIS — I2510 Atherosclerotic heart disease of native coronary artery without angina pectoris: Secondary | ICD-10-CM | POA: Diagnosis not present

## 2022-11-03 DIAGNOSIS — E871 Hypo-osmolality and hyponatremia: Secondary | ICD-10-CM | POA: Diagnosis not present

## 2022-11-03 DIAGNOSIS — E1122 Type 2 diabetes mellitus with diabetic chronic kidney disease: Secondary | ICD-10-CM | POA: Diagnosis not present

## 2022-11-03 DIAGNOSIS — F0153 Vascular dementia, unspecified severity, with mood disturbance: Secondary | ICD-10-CM | POA: Diagnosis not present

## 2022-11-03 DIAGNOSIS — Z8673 Personal history of transient ischemic attack (TIA), and cerebral infarction without residual deficits: Secondary | ICD-10-CM | POA: Diagnosis not present

## 2022-11-03 DIAGNOSIS — H9190 Unspecified hearing loss, unspecified ear: Secondary | ICD-10-CM | POA: Diagnosis not present

## 2022-11-03 DIAGNOSIS — Z87440 Personal history of urinary (tract) infections: Secondary | ICD-10-CM | POA: Diagnosis not present

## 2022-11-03 DIAGNOSIS — E1140 Type 2 diabetes mellitus with diabetic neuropathy, unspecified: Secondary | ICD-10-CM | POA: Diagnosis not present

## 2022-11-03 DIAGNOSIS — I129 Hypertensive chronic kidney disease with stage 1 through stage 4 chronic kidney disease, or unspecified chronic kidney disease: Secondary | ICD-10-CM | POA: Diagnosis not present

## 2022-11-03 DIAGNOSIS — M1990 Unspecified osteoarthritis, unspecified site: Secondary | ICD-10-CM | POA: Diagnosis not present

## 2022-11-03 DIAGNOSIS — R338 Other retention of urine: Secondary | ICD-10-CM | POA: Diagnosis not present

## 2022-11-03 DIAGNOSIS — Z556 Problems related to health literacy: Secondary | ICD-10-CM | POA: Diagnosis not present

## 2022-11-03 DIAGNOSIS — Z87891 Personal history of nicotine dependence: Secondary | ICD-10-CM | POA: Diagnosis not present

## 2022-11-03 DIAGNOSIS — F32A Depression, unspecified: Secondary | ICD-10-CM | POA: Diagnosis not present

## 2022-11-06 DIAGNOSIS — R339 Retention of urine, unspecified: Secondary | ICD-10-CM | POA: Diagnosis not present

## 2022-11-16 DIAGNOSIS — E7849 Other hyperlipidemia: Secondary | ICD-10-CM | POA: Diagnosis not present

## 2022-11-16 DIAGNOSIS — E1165 Type 2 diabetes mellitus with hyperglycemia: Secondary | ICD-10-CM | POA: Diagnosis not present

## 2022-11-16 DIAGNOSIS — E1100 Type 2 diabetes mellitus with hyperosmolarity without nonketotic hyperglycemic-hyperosmolar coma (NKHHC): Secondary | ICD-10-CM | POA: Diagnosis not present

## 2022-11-16 DIAGNOSIS — I10 Essential (primary) hypertension: Secondary | ICD-10-CM | POA: Diagnosis not present

## 2022-11-16 DIAGNOSIS — D518 Other vitamin B12 deficiency anemias: Secondary | ICD-10-CM | POA: Diagnosis not present

## 2022-11-16 DIAGNOSIS — I2510 Atherosclerotic heart disease of native coronary artery without angina pectoris: Secondary | ICD-10-CM | POA: Diagnosis not present

## 2022-11-16 DIAGNOSIS — E119 Type 2 diabetes mellitus without complications: Secondary | ICD-10-CM | POA: Diagnosis not present

## 2022-11-16 DIAGNOSIS — E559 Vitamin D deficiency, unspecified: Secondary | ICD-10-CM | POA: Diagnosis not present

## 2022-11-16 DIAGNOSIS — E785 Hyperlipidemia, unspecified: Secondary | ICD-10-CM | POA: Diagnosis not present

## 2022-11-16 DIAGNOSIS — E038 Other specified hypothyroidism: Secondary | ICD-10-CM | POA: Diagnosis not present

## 2022-11-22 DIAGNOSIS — R339 Retention of urine, unspecified: Secondary | ICD-10-CM | POA: Diagnosis not present

## 2022-11-22 DIAGNOSIS — I10 Essential (primary) hypertension: Secondary | ICD-10-CM | POA: Diagnosis not present

## 2022-11-22 DIAGNOSIS — E785 Hyperlipidemia, unspecified: Secondary | ICD-10-CM | POA: Diagnosis not present

## 2022-11-22 DIAGNOSIS — E119 Type 2 diabetes mellitus without complications: Secondary | ICD-10-CM | POA: Diagnosis not present

## 2022-11-22 DIAGNOSIS — K219 Gastro-esophageal reflux disease without esophagitis: Secondary | ICD-10-CM | POA: Diagnosis not present

## 2022-11-22 DIAGNOSIS — M1990 Unspecified osteoarthritis, unspecified site: Secondary | ICD-10-CM | POA: Diagnosis not present

## 2022-11-22 DIAGNOSIS — G629 Polyneuropathy, unspecified: Secondary | ICD-10-CM | POA: Diagnosis not present

## 2022-11-22 DIAGNOSIS — N182 Chronic kidney disease, stage 2 (mild): Secondary | ICD-10-CM | POA: Diagnosis not present

## 2022-11-22 DIAGNOSIS — I2510 Atherosclerotic heart disease of native coronary artery without angina pectoris: Secondary | ICD-10-CM | POA: Diagnosis not present

## 2022-11-23 DIAGNOSIS — R309 Painful micturition, unspecified: Secondary | ICD-10-CM | POA: Diagnosis not present

## 2022-11-25 DIAGNOSIS — I2510 Atherosclerotic heart disease of native coronary artery without angina pectoris: Secondary | ICD-10-CM | POA: Diagnosis not present

## 2022-11-25 DIAGNOSIS — E1122 Type 2 diabetes mellitus with diabetic chronic kidney disease: Secondary | ICD-10-CM | POA: Diagnosis not present

## 2022-11-25 DIAGNOSIS — Z556 Problems related to health literacy: Secondary | ICD-10-CM | POA: Diagnosis not present

## 2022-11-25 DIAGNOSIS — M1990 Unspecified osteoarthritis, unspecified site: Secondary | ICD-10-CM | POA: Diagnosis not present

## 2022-11-25 DIAGNOSIS — N390 Urinary tract infection, site not specified: Secondary | ICD-10-CM | POA: Diagnosis not present

## 2022-11-25 DIAGNOSIS — Z7984 Long term (current) use of oral hypoglycemic drugs: Secondary | ICD-10-CM | POA: Diagnosis not present

## 2022-11-25 DIAGNOSIS — J9601 Acute respiratory failure with hypoxia: Secondary | ICD-10-CM | POA: Diagnosis not present

## 2022-11-25 DIAGNOSIS — F0393 Unspecified dementia, unspecified severity, with mood disturbance: Secondary | ICD-10-CM | POA: Diagnosis not present

## 2022-11-25 DIAGNOSIS — Z466 Encounter for fitting and adjustment of urinary device: Secondary | ICD-10-CM | POA: Diagnosis not present

## 2022-11-25 DIAGNOSIS — F32A Depression, unspecified: Secondary | ICD-10-CM | POA: Diagnosis not present

## 2022-11-25 DIAGNOSIS — F0153 Vascular dementia, unspecified severity, with mood disturbance: Secondary | ICD-10-CM | POA: Diagnosis not present

## 2022-11-25 DIAGNOSIS — N182 Chronic kidney disease, stage 2 (mild): Secondary | ICD-10-CM | POA: Diagnosis not present

## 2022-11-25 DIAGNOSIS — R1310 Dysphagia, unspecified: Secondary | ICD-10-CM | POA: Diagnosis not present

## 2022-11-25 DIAGNOSIS — G4730 Sleep apnea, unspecified: Secondary | ICD-10-CM | POA: Diagnosis not present

## 2022-11-25 DIAGNOSIS — Z87891 Personal history of nicotine dependence: Secondary | ICD-10-CM | POA: Diagnosis not present

## 2022-11-25 DIAGNOSIS — Z794 Long term (current) use of insulin: Secondary | ICD-10-CM | POA: Diagnosis not present

## 2022-11-25 DIAGNOSIS — Z8673 Personal history of transient ischemic attack (TIA), and cerebral infarction without residual deficits: Secondary | ICD-10-CM | POA: Diagnosis not present

## 2022-11-25 DIAGNOSIS — E1140 Type 2 diabetes mellitus with diabetic neuropathy, unspecified: Secondary | ICD-10-CM | POA: Diagnosis not present

## 2022-11-25 DIAGNOSIS — H9190 Unspecified hearing loss, unspecified ear: Secondary | ICD-10-CM | POA: Diagnosis not present

## 2022-11-25 DIAGNOSIS — R338 Other retention of urine: Secondary | ICD-10-CM | POA: Diagnosis not present

## 2022-11-25 DIAGNOSIS — E871 Hypo-osmolality and hyponatremia: Secondary | ICD-10-CM | POA: Diagnosis not present

## 2022-11-25 DIAGNOSIS — N138 Other obstructive and reflux uropathy: Secondary | ICD-10-CM | POA: Diagnosis not present

## 2022-11-25 DIAGNOSIS — I129 Hypertensive chronic kidney disease with stage 1 through stage 4 chronic kidney disease, or unspecified chronic kidney disease: Secondary | ICD-10-CM | POA: Diagnosis not present

## 2022-11-25 DIAGNOSIS — Z87440 Personal history of urinary (tract) infections: Secondary | ICD-10-CM | POA: Diagnosis not present

## 2022-12-01 ENCOUNTER — Other Ambulatory Visit: Payer: Self-pay

## 2022-12-01 ENCOUNTER — Encounter (HOSPITAL_COMMUNITY): Payer: Self-pay

## 2022-12-01 ENCOUNTER — Emergency Department (HOSPITAL_COMMUNITY): Payer: Medicare Other

## 2022-12-01 ENCOUNTER — Emergency Department (HOSPITAL_COMMUNITY)
Admission: EM | Admit: 2022-12-01 | Discharge: 2022-12-01 | Disposition: A | Discharge status: Home / Self Care | Payer: Medicare Other | Attending: Emergency Medicine | Admitting: Emergency Medicine

## 2022-12-01 DIAGNOSIS — Z7982 Long term (current) use of aspirin: Secondary | ICD-10-CM | POA: Insufficient documentation

## 2022-12-01 DIAGNOSIS — M549 Dorsalgia, unspecified: Secondary | ICD-10-CM | POA: Diagnosis not present

## 2022-12-01 DIAGNOSIS — M47816 Spondylosis without myelopathy or radiculopathy, lumbar region: Secondary | ICD-10-CM | POA: Diagnosis not present

## 2022-12-01 DIAGNOSIS — Z043 Encounter for examination and observation following other accident: Secondary | ICD-10-CM | POA: Diagnosis not present

## 2022-12-01 DIAGNOSIS — M545 Low back pain, unspecified: Secondary | ICD-10-CM | POA: Diagnosis not present

## 2022-12-01 DIAGNOSIS — Z743 Need for continuous supervision: Secondary | ICD-10-CM | POA: Diagnosis not present

## 2022-12-01 DIAGNOSIS — Z794 Long term (current) use of insulin: Secondary | ICD-10-CM | POA: Insufficient documentation

## 2022-12-01 DIAGNOSIS — R531 Weakness: Secondary | ICD-10-CM | POA: Diagnosis not present

## 2022-12-01 DIAGNOSIS — M5459 Other low back pain: Secondary | ICD-10-CM | POA: Diagnosis not present

## 2022-12-01 DIAGNOSIS — F0390 Unspecified dementia without behavioral disturbance: Secondary | ICD-10-CM | POA: Diagnosis not present

## 2022-12-01 DIAGNOSIS — M5127 Other intervertebral disc displacement, lumbosacral region: Secondary | ICD-10-CM | POA: Diagnosis not present

## 2022-12-01 DIAGNOSIS — R5381 Other malaise: Secondary | ICD-10-CM | POA: Diagnosis not present

## 2022-12-01 DIAGNOSIS — M438X6 Other specified deforming dorsopathies, lumbar region: Secondary | ICD-10-CM | POA: Diagnosis not present

## 2022-12-01 DIAGNOSIS — E1165 Type 2 diabetes mellitus with hyperglycemia: Secondary | ICD-10-CM | POA: Diagnosis not present

## 2022-12-01 LAB — CBC WITH DIFFERENTIAL/PLATELET
Abs Immature Granulocytes: 0.01 10*3/uL (ref 0.00–0.07)
Basophils Absolute: 0.1 10*3/uL (ref 0.0–0.1)
Basophils Relative: 1 %
Eosinophils Absolute: 0.2 10*3/uL (ref 0.0–0.5)
Eosinophils Relative: 2 %
HCT: 38.8 % — ABNORMAL LOW (ref 39.0–52.0)
Hemoglobin: 13 g/dL (ref 13.0–17.0)
Immature Granulocytes: 0 %
Lymphocytes Relative: 29 %
Lymphs Abs: 2.2 10*3/uL (ref 0.7–4.0)
MCH: 30.9 pg (ref 26.0–34.0)
MCHC: 33.5 g/dL (ref 30.0–36.0)
MCV: 92.2 fL (ref 80.0–100.0)
Monocytes Absolute: 0.8 10*3/uL (ref 0.1–1.0)
Monocytes Relative: 10 %
Neutro Abs: 4.4 10*3/uL (ref 1.7–7.7)
Neutrophils Relative %: 58 %
Platelets: 297 10*3/uL (ref 150–400)
RBC: 4.21 MIL/uL — ABNORMAL LOW (ref 4.22–5.81)
RDW: 13 % (ref 11.5–15.5)
WBC: 7.6 10*3/uL (ref 4.0–10.5)
nRBC: 0 % (ref 0.0–0.2)

## 2022-12-01 LAB — BASIC METABOLIC PANEL
Anion gap: 10 (ref 5–15)
BUN: 17 mg/dL (ref 8–23)
CO2: 22 mmol/L (ref 22–32)
Calcium: 8.7 mg/dL — ABNORMAL LOW (ref 8.9–10.3)
Chloride: 102 mmol/L (ref 98–111)
Creatinine, Ser: 1.13 mg/dL (ref 0.61–1.24)
GFR, Estimated: >60 mL/min (ref >60)
Glucose, Bld: 99 mg/dL (ref 70–99)
Potassium: 4 mmol/L (ref 3.5–5.1)
Sodium: 134 mmol/L — ABNORMAL LOW (ref 135–145)

## 2022-12-01 MED ORDER — ACETAMINOPHEN 500 MG PO TABS
1000.0000 mg | ORAL_TABLET | Freq: Once | ORAL | Status: AC
  Administered 2022-12-01: 1000 mg via ORAL
  Filled 2022-12-01: qty 2

## 2022-12-01 NOTE — ED Notes (Signed)
Pt given a dinner tray

## 2022-12-01 NOTE — ED Provider Notes (Addendum)
[place] EMERGENCY DEPARTMENT AT [facility] Provider Note   CSN: [id number] Arrival date & time: 12/01/22  1008     History  Chief Complaint  Patient presents with   Back Pain    Lower right    Albert Garrison is a 77 y.o. male.  He has history of dementia and is not able to provide with history.  I spoke with Tiffany the nurse at [facility] and where he is a resident.  She states he was sent to the ER today to get x-rays of his back.  His roommate reported that he fell 2 days ago.  Patient did not recall this but did complain of low back pain at the time.  She had reached out to the patient's provider who ordered an x-ray, unfortunately they could not get the x-ray done today and patient was still complaining of pain 10 out of 10 when asked and was sent to the ER to get the x-rays expedited.  He is still able to ambulate.  Patient does have indwelling catheter he is currently on ciprofloxacin for UTI.  He has not had fevers or vomiting or other complaints.  When asked about pain patient did state he had low back pain as well as hand pain and pain all over.   The mental status changes.  Back Pain      Home Medications Prior to Admission medications   Medication Sig Start Date End Date Taking? Authorizing Provider  amLODipine (NORVASC) 10 MG tablet Take 1 tablet (10 mg total) by mouth daily. 01/11/21   Catarina Hartshorn, MD  aspirin 81 MG chewable tablet Chew 1 tablet (81 mg total) by mouth daily. 01/11/21   Catarina Hartshorn, MD  atorvastatin (LIPITOR) 40 MG tablet Take 1 tablet (40 mg total) by mouth daily. 01/11/21   Catarina Hartshorn, MD  finasteride (PROSCAR) 5 MG tablet Take 1 tablet (5 mg total) by mouth daily. 05/09/22   Jerilee Field, MD  haloperidol (HALDOL) 0.5 MG tablet Take 1 tablet (0.5 mg total) by mouth 2 (two) times daily. 01/11/21 02/10/21  Catarina Hartshorn, MD  HUMALOG KWIKPEN 100 UNIT/ML KwikPen Inject into the skin. 11/22/21   [provider]   magnesium oxide (MAG-OX) 400 (240 Mg) MG tablet Take 1 tablet (400 mg total) by mouth daily. 01/12/21   Catarina Hartshorn, MD  memantine (NAMENDA) 10 MG tablet Take 10 mg by mouth daily. 11/23/21   [provider]  metFORMIN (GLUCOPHAGE) 500 MG tablet Take 1 tablet (500 mg total) by mouth daily with breakfast. Patient taking differently: Take 500 mg by mouth daily with breakfast. Pt taking bid 01/11/21   Tat, Onalee Hua, MD  metoprolol tartrate (LOPRESSOR) 25 MG tablet Take 0.5 tablets (12.5 mg total) by mouth 2 (two) times daily. 01/11/21   Catarina Hartshorn, MD  mirtazapine (REMERON) 7.5 MG tablet Take 7.5 mg by mouth at bedtime. 07/06/21   [provider]  Multiple Vitamin (MULTIVITAMIN WITH MINERALS) TABS tablet Take 1 tablet by mouth daily. 01/11/21   Catarina Hartshorn, MD  silodosin (RAPAFLO) 8 MG CAPS capsule Take 8 mg by mouth daily. 08/07/21   [provider]      Allergies    Patient has no known allergies.    Review of Systems   Review of Systems  Musculoskeletal:  Positive for back pain.    Physical Exam Updated Vital Signs BP 120/70   Pulse 60   Temp 98.1 F (36.7 C) (Oral)  Resp (!) 23   Ht 5\' 11"  (1.803 m)   Wt 78 kg   SpO2 98%   BMI 23.98 kg/m  Physical Exam Vitals and nursing note reviewed.  Constitutional:      General: He is not in acute distress.    Appearance: He is well-developed.  HENT:     Head: Normocephalic and atraumatic.  Eyes:     Conjunctiva/sclera: Conjunctivae normal.  Cardiovascular:     Rate and Rhythm: Normal rate and regular rhythm.     Heart sounds: No murmur heard. Pulmonary:     Effort: Pulmonary effort is normal. No respiratory distress.     Breath sounds: Normal breath sounds.  Abdominal:     Palpations: Abdomen is soft.     Tenderness: There is no abdominal tenderness.  Musculoskeletal:        General: No swelling.     Cervical back: Normal and neck supple.     Thoracic back: Normal.     Lumbar back: No swelling, signs of  trauma or bony tenderness. Negative right straight leg raise test and negative left straight leg raise test.     Comments: Mild right lumbar tenderness  Skin:    General: Skin is warm and dry.     Capillary Refill: Capillary refill takes less than 2 seconds.  Neurological:     Mental Status: He is alert and oriented to person, place, and time. Mental status is at baseline.  Psychiatric:        Mood and Affect: Mood normal.     ED Results / Procedures / Treatments   Labs (all labs ordered are listed, but only abnormal results are displayed) Labs Reviewed  CBC WITH DIFFERENTIAL/PLATELET - Abnormal; Notable for the following components:      Result Value   RBC 4.21 (*)    HCT 38.8 (*)    All other components within normal limits  BASIC METABOLIC PANEL - Abnormal; Notable for the following components:   Sodium 134 (*)    Calcium 8.7 (*)    All other components within normal limits    EKG None  Radiology DG Lumbar Spine Complete  Result Date: 12/01/2022 CLINICAL DATA:  Low back pain.  Fall EXAM: LUMBAR SPINE - COMPLETE 5 VIEW COMPARISON:  None Available. FINDINGS: Five lumbar-type vertebral bodies. Levoconvex curvature of the spine centered at L3. Preserved vertebral body height. Multilevel disc height loss with endplate osteophytes, overall mild. Transitional lumbosacral segment. Surgical changes from laminectomies along the lower lumbar spine. Trace retrolisthesis of L5-S1 and anteriorly cysts of L4-5. Prominent facet degenerative changes diffusely. Scattered vascular calcifications. Note is made of moderate diffuse colonic stool. Recommend continue precautions until clinical clearance and if there is further concern of injury additional workup with CT as clinically appropriate. IMPRESSION: Levoconvex curvature of the spine with moderate degenerative changes overall. Trace levels of the listhesis. Presumed laminectomy changes along the lower lumbar spine. Transitional S1 segment.  Electronically Signed   By: Karen Kays M.D.   On: 12/01/2022 13:31    Procedures Procedures    Medications Ordered in ED Medications  acetaminophen (TYLENOL) tablet 1,000 mg (1,000 mg Oral Given 12/01/22 1411)    ED Course/ Medical Decision Making/ A&P                                 Medical Decision Making DDx: Fracture, sprain, strain, other  ED course: Patient here for back  pain in the setting of a reported fall witnessed by patient's roommate a couple of days ago.  Patient is on blood thinners, no signs of trauma.  He does not recall the fall but is complaining of back pain at the nursing facility so was sent for x-rays because they could not get them done there.  He is otherwise been his usual self.  He is able to ambulate, no changes with this.  Given reported fall will get some basic labs which there is no significant anemia or electrolyte abnormality lives is thought to have been a mechanical slip and fall by the nurse, states patient has slipped on the bathroom as patient will empty his catheter and there and spill urine on the floor at times causing slips.  X-rays show no acute fractures or traumatic malalignment.  Moderate degenerative changes, he is given Tylenol he has no complaints, I also spoke with his family member and Chad as his brother who is his power of attorney recently had esophageal surgery and was not available.  She had also been told that he had reportedly fallen 2 days ago but it had not been reported at that time.  Patient also evaluated by Dr. Denton Lank.  Will discharge to follow back up with primary care at the nursing home.  He states mental baseline.  No difficulty with ambulation.  He does not have any complaints at this time.  Amount and/or Complexity of Data Reviewed Labs: ordered. Radiology: ordered.  Risk OTC drugs.           Final Clinical Impression(s) / ED Diagnoses Final diagnoses:  Acute right-sided low back pain without sciatica     Rx / DC Orders ED Discharge Orders     None         Ma Rings, PA-C 12/01/22 [address], PA-C 12/01/22 1735    Cathren Laine, MD 12/07/22 [phone]

## 2022-12-01 NOTE — ED Triage Notes (Signed)
[facility] ffrom home states he's been c/o righted sided back pain for about 2 days. Pt has hx of dimentia but was able to share info about pain. Pt has indwelling urinary catheter. CBG was 170 per EMS. VS WNL in ED thus far.

## 2022-12-01 NOTE — ED Notes (Signed)
Pt has been added to convalescent list for transport back to facility upon EMS arrival.

## 2022-12-02 DIAGNOSIS — N138 Other obstructive and reflux uropathy: Secondary | ICD-10-CM | POA: Diagnosis not present

## 2022-12-02 DIAGNOSIS — N182 Chronic kidney disease, stage 2 (mild): Secondary | ICD-10-CM | POA: Diagnosis not present

## 2022-12-02 DIAGNOSIS — R1310 Dysphagia, unspecified: Secondary | ICD-10-CM | POA: Diagnosis not present

## 2022-12-02 DIAGNOSIS — Z466 Encounter for fitting and adjustment of urinary device: Secondary | ICD-10-CM | POA: Diagnosis not present

## 2022-12-02 DIAGNOSIS — Z87891 Personal history of nicotine dependence: Secondary | ICD-10-CM | POA: Diagnosis not present

## 2022-12-02 DIAGNOSIS — I129 Hypertensive chronic kidney disease with stage 1 through stage 4 chronic kidney disease, or unspecified chronic kidney disease: Secondary | ICD-10-CM | POA: Diagnosis not present

## 2022-12-02 DIAGNOSIS — E871 Hypo-osmolality and hyponatremia: Secondary | ICD-10-CM | POA: Diagnosis not present

## 2022-12-02 DIAGNOSIS — I2510 Atherosclerotic heart disease of native coronary artery without angina pectoris: Secondary | ICD-10-CM | POA: Diagnosis not present

## 2022-12-02 DIAGNOSIS — R338 Other retention of urine: Secondary | ICD-10-CM | POA: Diagnosis not present

## 2022-12-02 DIAGNOSIS — Z7984 Long term (current) use of oral hypoglycemic drugs: Secondary | ICD-10-CM | POA: Diagnosis not present

## 2022-12-02 DIAGNOSIS — Z8673 Personal history of transient ischemic attack (TIA), and cerebral infarction without residual deficits: Secondary | ICD-10-CM | POA: Diagnosis not present

## 2022-12-02 DIAGNOSIS — M1990 Unspecified osteoarthritis, unspecified site: Secondary | ICD-10-CM | POA: Diagnosis not present

## 2022-12-02 DIAGNOSIS — Z794 Long term (current) use of insulin: Secondary | ICD-10-CM | POA: Diagnosis not present

## 2022-12-02 DIAGNOSIS — E1140 Type 2 diabetes mellitus with diabetic neuropathy, unspecified: Secondary | ICD-10-CM | POA: Diagnosis not present

## 2022-12-02 DIAGNOSIS — J9601 Acute respiratory failure with hypoxia: Secondary | ICD-10-CM | POA: Diagnosis not present

## 2022-12-02 DIAGNOSIS — Z87440 Personal history of urinary (tract) infections: Secondary | ICD-10-CM | POA: Diagnosis not present

## 2022-12-02 DIAGNOSIS — E1122 Type 2 diabetes mellitus with diabetic chronic kidney disease: Secondary | ICD-10-CM | POA: Diagnosis not present

## 2022-12-02 DIAGNOSIS — Z556 Problems related to health literacy: Secondary | ICD-10-CM | POA: Diagnosis not present

## 2022-12-02 DIAGNOSIS — H9190 Unspecified hearing loss, unspecified ear: Secondary | ICD-10-CM | POA: Diagnosis not present

## 2022-12-02 DIAGNOSIS — G4730 Sleep apnea, unspecified: Secondary | ICD-10-CM | POA: Diagnosis not present

## 2022-12-06 DIAGNOSIS — M549 Dorsalgia, unspecified: Secondary | ICD-10-CM | POA: Diagnosis not present

## 2022-12-06 DIAGNOSIS — M1990 Unspecified osteoarthritis, unspecified site: Secondary | ICD-10-CM | POA: Diagnosis not present

## 2022-12-08 DIAGNOSIS — R338 Other retention of urine: Secondary | ICD-10-CM | POA: Diagnosis not present

## 2022-12-08 DIAGNOSIS — I2510 Atherosclerotic heart disease of native coronary artery without angina pectoris: Secondary | ICD-10-CM | POA: Diagnosis not present

## 2022-12-08 DIAGNOSIS — R1310 Dysphagia, unspecified: Secondary | ICD-10-CM | POA: Diagnosis not present

## 2022-12-08 DIAGNOSIS — M1990 Unspecified osteoarthritis, unspecified site: Secondary | ICD-10-CM | POA: Diagnosis not present

## 2022-12-08 DIAGNOSIS — H9190 Unspecified hearing loss, unspecified ear: Secondary | ICD-10-CM | POA: Diagnosis not present

## 2022-12-08 DIAGNOSIS — E1140 Type 2 diabetes mellitus with diabetic neuropathy, unspecified: Secondary | ICD-10-CM | POA: Diagnosis not present

## 2022-12-08 DIAGNOSIS — J9601 Acute respiratory failure with hypoxia: Secondary | ICD-10-CM | POA: Diagnosis not present

## 2022-12-08 DIAGNOSIS — E1121 Type 2 diabetes mellitus with diabetic nephropathy: Secondary | ICD-10-CM | POA: Diagnosis not present

## 2022-12-08 DIAGNOSIS — I129 Hypertensive chronic kidney disease with stage 1 through stage 4 chronic kidney disease, or unspecified chronic kidney disease: Secondary | ICD-10-CM | POA: Diagnosis not present

## 2022-12-08 DIAGNOSIS — M6281 Muscle weakness (generalized): Secondary | ICD-10-CM | POA: Diagnosis not present

## 2022-12-08 DIAGNOSIS — Z87440 Personal history of urinary (tract) infections: Secondary | ICD-10-CM | POA: Diagnosis not present

## 2022-12-08 DIAGNOSIS — N138 Other obstructive and reflux uropathy: Secondary | ICD-10-CM | POA: Diagnosis not present

## 2022-12-08 DIAGNOSIS — Z794 Long term (current) use of insulin: Secondary | ICD-10-CM | POA: Diagnosis not present

## 2022-12-08 DIAGNOSIS — G4730 Sleep apnea, unspecified: Secondary | ICD-10-CM | POA: Diagnosis not present

## 2022-12-08 DIAGNOSIS — I639 Cerebral infarction, unspecified: Secondary | ICD-10-CM | POA: Diagnosis not present

## 2022-12-08 DIAGNOSIS — Z8673 Personal history of transient ischemic attack (TIA), and cerebral infarction without residual deficits: Secondary | ICD-10-CM | POA: Diagnosis not present

## 2022-12-08 DIAGNOSIS — Z556 Problems related to health literacy: Secondary | ICD-10-CM | POA: Diagnosis not present

## 2022-12-08 DIAGNOSIS — N182 Chronic kidney disease, stage 2 (mild): Secondary | ICD-10-CM | POA: Diagnosis not present

## 2022-12-08 DIAGNOSIS — E1122 Type 2 diabetes mellitus with diabetic chronic kidney disease: Secondary | ICD-10-CM | POA: Diagnosis not present

## 2022-12-08 DIAGNOSIS — I10 Essential (primary) hypertension: Secondary | ICD-10-CM | POA: Diagnosis not present

## 2022-12-08 DIAGNOSIS — Z466 Encounter for fitting and adjustment of urinary device: Secondary | ICD-10-CM | POA: Diagnosis not present

## 2022-12-08 DIAGNOSIS — E871 Hypo-osmolality and hyponatremia: Secondary | ICD-10-CM | POA: Diagnosis not present

## 2022-12-08 DIAGNOSIS — R278 Other lack of coordination: Secondary | ICD-10-CM | POA: Diagnosis not present

## 2022-12-08 DIAGNOSIS — Z87891 Personal history of nicotine dependence: Secondary | ICD-10-CM | POA: Diagnosis not present

## 2022-12-08 DIAGNOSIS — Z7984 Long term (current) use of oral hypoglycemic drugs: Secondary | ICD-10-CM | POA: Diagnosis not present

## 2022-12-13 DIAGNOSIS — I639 Cerebral infarction, unspecified: Secondary | ICD-10-CM | POA: Diagnosis not present

## 2022-12-13 DIAGNOSIS — E1121 Type 2 diabetes mellitus with diabetic nephropathy: Secondary | ICD-10-CM | POA: Diagnosis not present

## 2022-12-13 DIAGNOSIS — I10 Essential (primary) hypertension: Secondary | ICD-10-CM | POA: Diagnosis not present

## 2022-12-13 DIAGNOSIS — R278 Other lack of coordination: Secondary | ICD-10-CM | POA: Diagnosis not present

## 2022-12-13 DIAGNOSIS — M6281 Muscle weakness (generalized): Secondary | ICD-10-CM | POA: Diagnosis not present

## 2022-12-14 DIAGNOSIS — Z794 Long term (current) use of insulin: Secondary | ICD-10-CM | POA: Diagnosis not present

## 2022-12-14 DIAGNOSIS — I129 Hypertensive chronic kidney disease with stage 1 through stage 4 chronic kidney disease, or unspecified chronic kidney disease: Secondary | ICD-10-CM | POA: Diagnosis not present

## 2022-12-14 DIAGNOSIS — M1990 Unspecified osteoarthritis, unspecified site: Secondary | ICD-10-CM | POA: Diagnosis not present

## 2022-12-14 DIAGNOSIS — Z7984 Long term (current) use of oral hypoglycemic drugs: Secondary | ICD-10-CM | POA: Diagnosis not present

## 2022-12-14 DIAGNOSIS — I2510 Atherosclerotic heart disease of native coronary artery without angina pectoris: Secondary | ICD-10-CM | POA: Diagnosis not present

## 2022-12-14 DIAGNOSIS — Z556 Problems related to health literacy: Secondary | ICD-10-CM | POA: Diagnosis not present

## 2022-12-14 DIAGNOSIS — N182 Chronic kidney disease, stage 2 (mild): Secondary | ICD-10-CM | POA: Diagnosis not present

## 2022-12-14 DIAGNOSIS — N138 Other obstructive and reflux uropathy: Secondary | ICD-10-CM | POA: Diagnosis not present

## 2022-12-14 DIAGNOSIS — Z8673 Personal history of transient ischemic attack (TIA), and cerebral infarction without residual deficits: Secondary | ICD-10-CM | POA: Diagnosis not present

## 2022-12-14 DIAGNOSIS — Z87891 Personal history of nicotine dependence: Secondary | ICD-10-CM | POA: Diagnosis not present

## 2022-12-14 DIAGNOSIS — Z87440 Personal history of urinary (tract) infections: Secondary | ICD-10-CM | POA: Diagnosis not present

## 2022-12-14 DIAGNOSIS — G4730 Sleep apnea, unspecified: Secondary | ICD-10-CM | POA: Diagnosis not present

## 2022-12-14 DIAGNOSIS — J9601 Acute respiratory failure with hypoxia: Secondary | ICD-10-CM | POA: Diagnosis not present

## 2022-12-14 DIAGNOSIS — R338 Other retention of urine: Secondary | ICD-10-CM | POA: Diagnosis not present

## 2022-12-14 DIAGNOSIS — E1122 Type 2 diabetes mellitus with diabetic chronic kidney disease: Secondary | ICD-10-CM | POA: Diagnosis not present

## 2022-12-14 DIAGNOSIS — H9190 Unspecified hearing loss, unspecified ear: Secondary | ICD-10-CM | POA: Diagnosis not present

## 2022-12-14 DIAGNOSIS — Z466 Encounter for fitting and adjustment of urinary device: Secondary | ICD-10-CM | POA: Diagnosis not present

## 2022-12-14 DIAGNOSIS — R1310 Dysphagia, unspecified: Secondary | ICD-10-CM | POA: Diagnosis not present

## 2022-12-14 DIAGNOSIS — E1140 Type 2 diabetes mellitus with diabetic neuropathy, unspecified: Secondary | ICD-10-CM | POA: Diagnosis not present

## 2022-12-15 DIAGNOSIS — R278 Other lack of coordination: Secondary | ICD-10-CM | POA: Diagnosis not present

## 2022-12-15 DIAGNOSIS — M6281 Muscle weakness (generalized): Secondary | ICD-10-CM | POA: Diagnosis not present

## 2022-12-15 DIAGNOSIS — I639 Cerebral infarction, unspecified: Secondary | ICD-10-CM | POA: Diagnosis not present

## 2022-12-15 DIAGNOSIS — E1121 Type 2 diabetes mellitus with diabetic nephropathy: Secondary | ICD-10-CM | POA: Diagnosis not present

## 2022-12-15 DIAGNOSIS — I10 Essential (primary) hypertension: Secondary | ICD-10-CM | POA: Diagnosis not present

## 2022-12-21 DIAGNOSIS — E1140 Type 2 diabetes mellitus with diabetic neuropathy, unspecified: Secondary | ICD-10-CM | POA: Diagnosis not present

## 2022-12-21 DIAGNOSIS — R1310 Dysphagia, unspecified: Secondary | ICD-10-CM | POA: Diagnosis not present

## 2022-12-21 DIAGNOSIS — Z7984 Long term (current) use of oral hypoglycemic drugs: Secondary | ICD-10-CM | POA: Diagnosis not present

## 2022-12-21 DIAGNOSIS — H9190 Unspecified hearing loss, unspecified ear: Secondary | ICD-10-CM | POA: Diagnosis not present

## 2022-12-21 DIAGNOSIS — Z8673 Personal history of transient ischemic attack (TIA), and cerebral infarction without residual deficits: Secondary | ICD-10-CM | POA: Diagnosis not present

## 2022-12-21 DIAGNOSIS — I129 Hypertensive chronic kidney disease with stage 1 through stage 4 chronic kidney disease, or unspecified chronic kidney disease: Secondary | ICD-10-CM | POA: Diagnosis not present

## 2022-12-21 DIAGNOSIS — M1990 Unspecified osteoarthritis, unspecified site: Secondary | ICD-10-CM | POA: Diagnosis not present

## 2022-12-21 DIAGNOSIS — I2510 Atherosclerotic heart disease of native coronary artery without angina pectoris: Secondary | ICD-10-CM | POA: Diagnosis not present

## 2022-12-21 DIAGNOSIS — N138 Other obstructive and reflux uropathy: Secondary | ICD-10-CM | POA: Diagnosis not present

## 2022-12-21 DIAGNOSIS — G4730 Sleep apnea, unspecified: Secondary | ICD-10-CM | POA: Diagnosis not present

## 2022-12-21 DIAGNOSIS — N182 Chronic kidney disease, stage 2 (mild): Secondary | ICD-10-CM | POA: Diagnosis not present

## 2022-12-21 DIAGNOSIS — Z556 Problems related to health literacy: Secondary | ICD-10-CM | POA: Diagnosis not present

## 2022-12-21 DIAGNOSIS — E1122 Type 2 diabetes mellitus with diabetic chronic kidney disease: Secondary | ICD-10-CM | POA: Diagnosis not present

## 2022-12-21 DIAGNOSIS — Z87891 Personal history of nicotine dependence: Secondary | ICD-10-CM | POA: Diagnosis not present

## 2022-12-21 DIAGNOSIS — Z794 Long term (current) use of insulin: Secondary | ICD-10-CM | POA: Diagnosis not present

## 2022-12-21 DIAGNOSIS — R338 Other retention of urine: Secondary | ICD-10-CM | POA: Diagnosis not present

## 2022-12-21 DIAGNOSIS — J9601 Acute respiratory failure with hypoxia: Secondary | ICD-10-CM | POA: Diagnosis not present

## 2022-12-21 DIAGNOSIS — Z87440 Personal history of urinary (tract) infections: Secondary | ICD-10-CM | POA: Diagnosis not present

## 2022-12-21 DIAGNOSIS — Z466 Encounter for fitting and adjustment of urinary device: Secondary | ICD-10-CM | POA: Diagnosis not present

## 2022-12-22 DIAGNOSIS — R278 Other lack of coordination: Secondary | ICD-10-CM | POA: Diagnosis not present

## 2022-12-22 DIAGNOSIS — E1121 Type 2 diabetes mellitus with diabetic nephropathy: Secondary | ICD-10-CM | POA: Diagnosis not present

## 2022-12-22 DIAGNOSIS — I10 Essential (primary) hypertension: Secondary | ICD-10-CM | POA: Diagnosis not present

## 2022-12-22 DIAGNOSIS — I639 Cerebral infarction, unspecified: Secondary | ICD-10-CM | POA: Diagnosis not present

## 2022-12-22 DIAGNOSIS — M6281 Muscle weakness (generalized): Secondary | ICD-10-CM | POA: Diagnosis not present

## 2022-12-28 DIAGNOSIS — E038 Other specified hypothyroidism: Secondary | ICD-10-CM | POA: Diagnosis not present

## 2022-12-28 DIAGNOSIS — M2041 Other hammer toe(s) (acquired), right foot: Secondary | ICD-10-CM | POA: Diagnosis not present

## 2022-12-28 DIAGNOSIS — E785 Hyperlipidemia, unspecified: Secondary | ICD-10-CM | POA: Diagnosis not present

## 2022-12-28 DIAGNOSIS — E559 Vitamin D deficiency, unspecified: Secondary | ICD-10-CM | POA: Diagnosis not present

## 2022-12-28 DIAGNOSIS — E7849 Other hyperlipidemia: Secondary | ICD-10-CM | POA: Diagnosis not present

## 2022-12-28 DIAGNOSIS — G4730 Sleep apnea, unspecified: Secondary | ICD-10-CM | POA: Diagnosis not present

## 2022-12-28 DIAGNOSIS — I129 Hypertensive chronic kidney disease with stage 1 through stage 4 chronic kidney disease, or unspecified chronic kidney disease: Secondary | ICD-10-CM | POA: Diagnosis not present

## 2022-12-28 DIAGNOSIS — Z87440 Personal history of urinary (tract) infections: Secondary | ICD-10-CM | POA: Diagnosis not present

## 2022-12-28 DIAGNOSIS — I8393 Asymptomatic varicose veins of bilateral lower extremities: Secondary | ICD-10-CM | POA: Diagnosis not present

## 2022-12-28 DIAGNOSIS — R1310 Dysphagia, unspecified: Secondary | ICD-10-CM | POA: Diagnosis not present

## 2022-12-28 DIAGNOSIS — D518 Other vitamin B12 deficiency anemias: Secondary | ICD-10-CM | POA: Diagnosis not present

## 2022-12-28 DIAGNOSIS — N138 Other obstructive and reflux uropathy: Secondary | ICD-10-CM | POA: Diagnosis not present

## 2022-12-28 DIAGNOSIS — Z556 Problems related to health literacy: Secondary | ICD-10-CM | POA: Diagnosis not present

## 2022-12-28 DIAGNOSIS — Q6671 Congenital pes cavus, right foot: Secondary | ICD-10-CM | POA: Diagnosis not present

## 2022-12-28 DIAGNOSIS — M1990 Unspecified osteoarthritis, unspecified site: Secondary | ICD-10-CM | POA: Diagnosis not present

## 2022-12-28 DIAGNOSIS — I10 Essential (primary) hypertension: Secondary | ICD-10-CM | POA: Diagnosis not present

## 2022-12-28 DIAGNOSIS — M21629 Bunionette of unspecified foot: Secondary | ICD-10-CM | POA: Diagnosis not present

## 2022-12-28 DIAGNOSIS — Z7984 Long term (current) use of oral hypoglycemic drugs: Secondary | ICD-10-CM | POA: Diagnosis not present

## 2022-12-28 DIAGNOSIS — E1165 Type 2 diabetes mellitus with hyperglycemia: Secondary | ICD-10-CM | POA: Diagnosis not present

## 2022-12-28 DIAGNOSIS — L600 Ingrowing nail: Secondary | ICD-10-CM | POA: Diagnosis not present

## 2022-12-28 DIAGNOSIS — M2042 Other hammer toe(s) (acquired), left foot: Secondary | ICD-10-CM | POA: Diagnosis not present

## 2022-12-28 DIAGNOSIS — N182 Chronic kidney disease, stage 2 (mild): Secondary | ICD-10-CM | POA: Diagnosis not present

## 2022-12-28 DIAGNOSIS — E119 Type 2 diabetes mellitus without complications: Secondary | ICD-10-CM | POA: Diagnosis not present

## 2022-12-28 DIAGNOSIS — R338 Other retention of urine: Secondary | ICD-10-CM | POA: Diagnosis not present

## 2022-12-28 DIAGNOSIS — Z794 Long term (current) use of insulin: Secondary | ICD-10-CM | POA: Diagnosis not present

## 2022-12-28 DIAGNOSIS — B351 Tinea unguium: Secondary | ICD-10-CM | POA: Diagnosis not present

## 2022-12-28 DIAGNOSIS — Z8673 Personal history of transient ischemic attack (TIA), and cerebral infarction without residual deficits: Secondary | ICD-10-CM | POA: Diagnosis not present

## 2022-12-28 DIAGNOSIS — E1122 Type 2 diabetes mellitus with diabetic chronic kidney disease: Secondary | ICD-10-CM | POA: Diagnosis not present

## 2022-12-28 DIAGNOSIS — Z466 Encounter for fitting and adjustment of urinary device: Secondary | ICD-10-CM | POA: Diagnosis not present

## 2022-12-28 DIAGNOSIS — I2510 Atherosclerotic heart disease of native coronary artery without angina pectoris: Secondary | ICD-10-CM | POA: Diagnosis not present

## 2022-12-28 DIAGNOSIS — Z79899 Other long term (current) drug therapy: Secondary | ICD-10-CM | POA: Diagnosis not present

## 2022-12-28 DIAGNOSIS — E1140 Type 2 diabetes mellitus with diabetic neuropathy, unspecified: Secondary | ICD-10-CM | POA: Diagnosis not present

## 2022-12-28 DIAGNOSIS — E11621 Type 2 diabetes mellitus with foot ulcer: Secondary | ICD-10-CM | POA: Diagnosis not present

## 2022-12-28 DIAGNOSIS — Z87891 Personal history of nicotine dependence: Secondary | ICD-10-CM | POA: Diagnosis not present

## 2022-12-28 DIAGNOSIS — M79672 Pain in left foot: Secondary | ICD-10-CM | POA: Diagnosis not present

## 2022-12-28 DIAGNOSIS — H9190 Unspecified hearing loss, unspecified ear: Secondary | ICD-10-CM | POA: Diagnosis not present

## 2022-12-28 DIAGNOSIS — Q6672 Congenital pes cavus, left foot: Secondary | ICD-10-CM | POA: Diagnosis not present

## 2022-12-28 DIAGNOSIS — M79671 Pain in right foot: Secondary | ICD-10-CM | POA: Diagnosis not present

## 2022-12-28 DIAGNOSIS — E782 Mixed hyperlipidemia: Secondary | ICD-10-CM | POA: Diagnosis not present

## 2022-12-28 DIAGNOSIS — E1100 Type 2 diabetes mellitus with hyperosmolarity without nonketotic hyperglycemic-hyperosmolar coma (NKHHC): Secondary | ICD-10-CM | POA: Diagnosis not present

## 2022-12-28 DIAGNOSIS — J9601 Acute respiratory failure with hypoxia: Secondary | ICD-10-CM | POA: Diagnosis not present

## 2023-01-01 DIAGNOSIS — E1165 Type 2 diabetes mellitus with hyperglycemia: Secondary | ICD-10-CM | POA: Diagnosis not present

## 2023-01-03 DIAGNOSIS — E785 Hyperlipidemia, unspecified: Secondary | ICD-10-CM | POA: Diagnosis not present

## 2023-01-03 DIAGNOSIS — I2510 Atherosclerotic heart disease of native coronary artery without angina pectoris: Secondary | ICD-10-CM | POA: Diagnosis not present

## 2023-01-03 DIAGNOSIS — N182 Chronic kidney disease, stage 2 (mild): Secondary | ICD-10-CM | POA: Diagnosis not present

## 2023-01-03 DIAGNOSIS — T83511A Infection and inflammatory reaction due to indwelling urethral catheter, initial encounter: Secondary | ICD-10-CM | POA: Diagnosis not present

## 2023-01-03 DIAGNOSIS — Z743 Need for continuous supervision: Secondary | ICD-10-CM | POA: Diagnosis not present

## 2023-01-03 DIAGNOSIS — E119 Type 2 diabetes mellitus without complications: Secondary | ICD-10-CM | POA: Diagnosis not present

## 2023-01-03 DIAGNOSIS — I499 Cardiac arrhythmia, unspecified: Secondary | ICD-10-CM | POA: Diagnosis not present

## 2023-01-03 DIAGNOSIS — R5381 Other malaise: Secondary | ICD-10-CM | POA: Diagnosis not present

## 2023-01-03 DIAGNOSIS — I10 Essential (primary) hypertension: Secondary | ICD-10-CM | POA: Diagnosis not present

## 2023-01-03 DIAGNOSIS — K219 Gastro-esophageal reflux disease without esophagitis: Secondary | ICD-10-CM | POA: Diagnosis not present

## 2023-01-03 DIAGNOSIS — R339 Retention of urine, unspecified: Secondary | ICD-10-CM | POA: Diagnosis not present

## 2023-01-03 DIAGNOSIS — G629 Polyneuropathy, unspecified: Secondary | ICD-10-CM | POA: Diagnosis not present

## 2023-01-03 DIAGNOSIS — M1990 Unspecified osteoarthritis, unspecified site: Secondary | ICD-10-CM | POA: Diagnosis not present

## 2023-01-03 DIAGNOSIS — I77811 Abdominal aortic ectasia: Secondary | ICD-10-CM | POA: Diagnosis not present

## 2023-01-04 DIAGNOSIS — E1140 Type 2 diabetes mellitus with diabetic neuropathy, unspecified: Secondary | ICD-10-CM | POA: Diagnosis not present

## 2023-01-04 DIAGNOSIS — N182 Chronic kidney disease, stage 2 (mild): Secondary | ICD-10-CM | POA: Diagnosis not present

## 2023-01-04 DIAGNOSIS — R1310 Dysphagia, unspecified: Secondary | ICD-10-CM | POA: Diagnosis not present

## 2023-01-04 DIAGNOSIS — Z8673 Personal history of transient ischemic attack (TIA), and cerebral infarction without residual deficits: Secondary | ICD-10-CM | POA: Diagnosis not present

## 2023-01-04 DIAGNOSIS — Z466 Encounter for fitting and adjustment of urinary device: Secondary | ICD-10-CM | POA: Diagnosis not present

## 2023-01-04 DIAGNOSIS — Z743 Need for continuous supervision: Secondary | ICD-10-CM | POA: Diagnosis not present

## 2023-01-04 DIAGNOSIS — J9601 Acute respiratory failure with hypoxia: Secondary | ICD-10-CM | POA: Diagnosis not present

## 2023-01-04 DIAGNOSIS — M1990 Unspecified osteoarthritis, unspecified site: Secondary | ICD-10-CM | POA: Diagnosis not present

## 2023-01-04 DIAGNOSIS — I2510 Atherosclerotic heart disease of native coronary artery without angina pectoris: Secondary | ICD-10-CM | POA: Diagnosis not present

## 2023-01-04 DIAGNOSIS — R5381 Other malaise: Secondary | ICD-10-CM | POA: Diagnosis not present

## 2023-01-04 DIAGNOSIS — Z7984 Long term (current) use of oral hypoglycemic drugs: Secondary | ICD-10-CM | POA: Diagnosis not present

## 2023-01-04 DIAGNOSIS — Z87440 Personal history of urinary (tract) infections: Secondary | ICD-10-CM | POA: Diagnosis not present

## 2023-01-04 DIAGNOSIS — R338 Other retention of urine: Secondary | ICD-10-CM | POA: Diagnosis not present

## 2023-01-04 DIAGNOSIS — Z556 Problems related to health literacy: Secondary | ICD-10-CM | POA: Diagnosis not present

## 2023-01-04 DIAGNOSIS — R531 Weakness: Secondary | ICD-10-CM | POA: Diagnosis not present

## 2023-01-04 DIAGNOSIS — H9190 Unspecified hearing loss, unspecified ear: Secondary | ICD-10-CM | POA: Diagnosis not present

## 2023-01-04 DIAGNOSIS — Z794 Long term (current) use of insulin: Secondary | ICD-10-CM | POA: Diagnosis not present

## 2023-01-04 DIAGNOSIS — E1122 Type 2 diabetes mellitus with diabetic chronic kidney disease: Secondary | ICD-10-CM | POA: Diagnosis not present

## 2023-01-04 DIAGNOSIS — G4730 Sleep apnea, unspecified: Secondary | ICD-10-CM | POA: Diagnosis not present

## 2023-01-04 DIAGNOSIS — N138 Other obstructive and reflux uropathy: Secondary | ICD-10-CM | POA: Diagnosis not present

## 2023-01-04 DIAGNOSIS — Z87891 Personal history of nicotine dependence: Secondary | ICD-10-CM | POA: Diagnosis not present

## 2023-01-04 DIAGNOSIS — I129 Hypertensive chronic kidney disease with stage 1 through stage 4 chronic kidney disease, or unspecified chronic kidney disease: Secondary | ICD-10-CM | POA: Diagnosis not present

## 2023-01-05 DIAGNOSIS — R0989 Other specified symptoms and signs involving the circulatory and respiratory systems: Secondary | ICD-10-CM | POA: Diagnosis not present

## 2023-01-05 DIAGNOSIS — I6523 Occlusion and stenosis of bilateral carotid arteries: Secondary | ICD-10-CM | POA: Diagnosis not present

## 2023-01-10 DIAGNOSIS — I70223 Atherosclerosis of native arteries of extremities with rest pain, bilateral legs: Secondary | ICD-10-CM | POA: Diagnosis not present

## 2023-01-14 DIAGNOSIS — H9190 Unspecified hearing loss, unspecified ear: Secondary | ICD-10-CM | POA: Diagnosis not present

## 2023-01-14 DIAGNOSIS — Z794 Long term (current) use of insulin: Secondary | ICD-10-CM | POA: Diagnosis not present

## 2023-01-14 DIAGNOSIS — Z556 Problems related to health literacy: Secondary | ICD-10-CM | POA: Diagnosis not present

## 2023-01-14 DIAGNOSIS — M1990 Unspecified osteoarthritis, unspecified site: Secondary | ICD-10-CM | POA: Diagnosis not present

## 2023-01-14 DIAGNOSIS — G4730 Sleep apnea, unspecified: Secondary | ICD-10-CM | POA: Diagnosis not present

## 2023-01-14 DIAGNOSIS — R1310 Dysphagia, unspecified: Secondary | ICD-10-CM | POA: Diagnosis not present

## 2023-01-14 DIAGNOSIS — I2510 Atherosclerotic heart disease of native coronary artery without angina pectoris: Secondary | ICD-10-CM | POA: Diagnosis not present

## 2023-01-14 DIAGNOSIS — I129 Hypertensive chronic kidney disease with stage 1 through stage 4 chronic kidney disease, or unspecified chronic kidney disease: Secondary | ICD-10-CM | POA: Diagnosis not present

## 2023-01-14 DIAGNOSIS — N182 Chronic kidney disease, stage 2 (mild): Secondary | ICD-10-CM | POA: Diagnosis not present

## 2023-01-14 DIAGNOSIS — J9601 Acute respiratory failure with hypoxia: Secondary | ICD-10-CM | POA: Diagnosis not present

## 2023-01-14 DIAGNOSIS — E1122 Type 2 diabetes mellitus with diabetic chronic kidney disease: Secondary | ICD-10-CM | POA: Diagnosis not present

## 2023-01-14 DIAGNOSIS — N138 Other obstructive and reflux uropathy: Secondary | ICD-10-CM | POA: Diagnosis not present

## 2023-01-14 DIAGNOSIS — Z7984 Long term (current) use of oral hypoglycemic drugs: Secondary | ICD-10-CM | POA: Diagnosis not present

## 2023-01-14 DIAGNOSIS — E1140 Type 2 diabetes mellitus with diabetic neuropathy, unspecified: Secondary | ICD-10-CM | POA: Diagnosis not present

## 2023-01-14 DIAGNOSIS — Z466 Encounter for fitting and adjustment of urinary device: Secondary | ICD-10-CM | POA: Diagnosis not present

## 2023-01-14 DIAGNOSIS — Z87891 Personal history of nicotine dependence: Secondary | ICD-10-CM | POA: Diagnosis not present

## 2023-01-14 DIAGNOSIS — Z87440 Personal history of urinary (tract) infections: Secondary | ICD-10-CM | POA: Diagnosis not present

## 2023-01-14 DIAGNOSIS — R338 Other retention of urine: Secondary | ICD-10-CM | POA: Diagnosis not present

## 2023-01-14 DIAGNOSIS — Z8673 Personal history of transient ischemic attack (TIA), and cerebral infarction without residual deficits: Secondary | ICD-10-CM | POA: Diagnosis not present

## 2023-01-19 DIAGNOSIS — Z8673 Personal history of transient ischemic attack (TIA), and cerebral infarction without residual deficits: Secondary | ICD-10-CM | POA: Diagnosis not present

## 2023-01-19 DIAGNOSIS — H9190 Unspecified hearing loss, unspecified ear: Secondary | ICD-10-CM | POA: Diagnosis not present

## 2023-01-19 DIAGNOSIS — Z87891 Personal history of nicotine dependence: Secondary | ICD-10-CM | POA: Diagnosis not present

## 2023-01-19 DIAGNOSIS — Z556 Problems related to health literacy: Secondary | ICD-10-CM | POA: Diagnosis not present

## 2023-01-19 DIAGNOSIS — Z7984 Long term (current) use of oral hypoglycemic drugs: Secondary | ICD-10-CM | POA: Diagnosis not present

## 2023-01-19 DIAGNOSIS — I129 Hypertensive chronic kidney disease with stage 1 through stage 4 chronic kidney disease, or unspecified chronic kidney disease: Secondary | ICD-10-CM | POA: Diagnosis not present

## 2023-01-19 DIAGNOSIS — J9601 Acute respiratory failure with hypoxia: Secondary | ICD-10-CM | POA: Diagnosis not present

## 2023-01-19 DIAGNOSIS — G4730 Sleep apnea, unspecified: Secondary | ICD-10-CM | POA: Diagnosis not present

## 2023-01-19 DIAGNOSIS — Z794 Long term (current) use of insulin: Secondary | ICD-10-CM | POA: Diagnosis not present

## 2023-01-19 DIAGNOSIS — M1990 Unspecified osteoarthritis, unspecified site: Secondary | ICD-10-CM | POA: Diagnosis not present

## 2023-01-19 DIAGNOSIS — N138 Other obstructive and reflux uropathy: Secondary | ICD-10-CM | POA: Diagnosis not present

## 2023-01-19 DIAGNOSIS — R1310 Dysphagia, unspecified: Secondary | ICD-10-CM | POA: Diagnosis not present

## 2023-01-19 DIAGNOSIS — Z87440 Personal history of urinary (tract) infections: Secondary | ICD-10-CM | POA: Diagnosis not present

## 2023-01-19 DIAGNOSIS — R338 Other retention of urine: Secondary | ICD-10-CM | POA: Diagnosis not present

## 2023-01-19 DIAGNOSIS — E1122 Type 2 diabetes mellitus with diabetic chronic kidney disease: Secondary | ICD-10-CM | POA: Diagnosis not present

## 2023-01-19 DIAGNOSIS — I2510 Atherosclerotic heart disease of native coronary artery without angina pectoris: Secondary | ICD-10-CM | POA: Diagnosis not present

## 2023-01-19 DIAGNOSIS — E1140 Type 2 diabetes mellitus with diabetic neuropathy, unspecified: Secondary | ICD-10-CM | POA: Diagnosis not present

## 2023-01-19 DIAGNOSIS — Z466 Encounter for fitting and adjustment of urinary device: Secondary | ICD-10-CM | POA: Diagnosis not present

## 2023-01-19 DIAGNOSIS — N182 Chronic kidney disease, stage 2 (mild): Secondary | ICD-10-CM | POA: Diagnosis not present

## 2023-01-20 DIAGNOSIS — N182 Chronic kidney disease, stage 2 (mild): Secondary | ICD-10-CM | POA: Diagnosis not present

## 2023-01-20 DIAGNOSIS — M1990 Unspecified osteoarthritis, unspecified site: Secondary | ICD-10-CM | POA: Diagnosis not present

## 2023-01-20 DIAGNOSIS — I10 Essential (primary) hypertension: Secondary | ICD-10-CM | POA: Diagnosis not present

## 2023-01-20 DIAGNOSIS — Z794 Long term (current) use of insulin: Secondary | ICD-10-CM | POA: Diagnosis not present

## 2023-01-20 DIAGNOSIS — E559 Vitamin D deficiency, unspecified: Secondary | ICD-10-CM | POA: Diagnosis not present

## 2023-01-20 DIAGNOSIS — N138 Other obstructive and reflux uropathy: Secondary | ICD-10-CM | POA: Diagnosis not present

## 2023-01-20 DIAGNOSIS — Z8673 Personal history of transient ischemic attack (TIA), and cerebral infarction without residual deficits: Secondary | ICD-10-CM | POA: Diagnosis not present

## 2023-01-20 DIAGNOSIS — E1165 Type 2 diabetes mellitus with hyperglycemia: Secondary | ICD-10-CM | POA: Diagnosis not present

## 2023-01-20 DIAGNOSIS — E1100 Type 2 diabetes mellitus with hyperosmolarity without nonketotic hyperglycemic-hyperosmolar coma (NKHHC): Secondary | ICD-10-CM | POA: Diagnosis not present

## 2023-01-20 DIAGNOSIS — D518 Other vitamin B12 deficiency anemias: Secondary | ICD-10-CM | POA: Diagnosis not present

## 2023-01-20 DIAGNOSIS — G4730 Sleep apnea, unspecified: Secondary | ICD-10-CM | POA: Diagnosis not present

## 2023-01-20 DIAGNOSIS — I129 Hypertensive chronic kidney disease with stage 1 through stage 4 chronic kidney disease, or unspecified chronic kidney disease: Secondary | ICD-10-CM | POA: Diagnosis not present

## 2023-01-20 DIAGNOSIS — E1122 Type 2 diabetes mellitus with diabetic chronic kidney disease: Secondary | ICD-10-CM | POA: Diagnosis not present

## 2023-01-20 DIAGNOSIS — R1310 Dysphagia, unspecified: Secondary | ICD-10-CM | POA: Diagnosis not present

## 2023-01-20 DIAGNOSIS — Z7984 Long term (current) use of oral hypoglycemic drugs: Secondary | ICD-10-CM | POA: Diagnosis not present

## 2023-01-20 DIAGNOSIS — Z556 Problems related to health literacy: Secondary | ICD-10-CM | POA: Diagnosis not present

## 2023-01-20 DIAGNOSIS — E7849 Other hyperlipidemia: Secondary | ICD-10-CM | POA: Diagnosis not present

## 2023-01-20 DIAGNOSIS — I2510 Atherosclerotic heart disease of native coronary artery without angina pectoris: Secondary | ICD-10-CM | POA: Diagnosis not present

## 2023-01-20 DIAGNOSIS — J9601 Acute respiratory failure with hypoxia: Secondary | ICD-10-CM | POA: Diagnosis not present

## 2023-01-20 DIAGNOSIS — E119 Type 2 diabetes mellitus without complications: Secondary | ICD-10-CM | POA: Diagnosis not present

## 2023-01-20 DIAGNOSIS — E785 Hyperlipidemia, unspecified: Secondary | ICD-10-CM | POA: Diagnosis not present

## 2023-01-20 DIAGNOSIS — E038 Other specified hypothyroidism: Secondary | ICD-10-CM | POA: Diagnosis not present

## 2023-01-20 DIAGNOSIS — E1140 Type 2 diabetes mellitus with diabetic neuropathy, unspecified: Secondary | ICD-10-CM | POA: Diagnosis not present

## 2023-01-20 DIAGNOSIS — Z87891 Personal history of nicotine dependence: Secondary | ICD-10-CM | POA: Diagnosis not present

## 2023-01-20 DIAGNOSIS — R338 Other retention of urine: Secondary | ICD-10-CM | POA: Diagnosis not present

## 2023-01-20 DIAGNOSIS — Z466 Encounter for fitting and adjustment of urinary device: Secondary | ICD-10-CM | POA: Diagnosis not present

## 2023-01-20 DIAGNOSIS — H9190 Unspecified hearing loss, unspecified ear: Secondary | ICD-10-CM | POA: Diagnosis not present

## 2023-01-20 DIAGNOSIS — Z87440 Personal history of urinary (tract) infections: Secondary | ICD-10-CM | POA: Diagnosis not present

## 2023-01-25 DIAGNOSIS — Z556 Problems related to health literacy: Secondary | ICD-10-CM | POA: Diagnosis not present

## 2023-01-25 DIAGNOSIS — E1122 Type 2 diabetes mellitus with diabetic chronic kidney disease: Secondary | ICD-10-CM | POA: Diagnosis not present

## 2023-01-25 DIAGNOSIS — N138 Other obstructive and reflux uropathy: Secondary | ICD-10-CM | POA: Diagnosis not present

## 2023-01-25 DIAGNOSIS — Z7984 Long term (current) use of oral hypoglycemic drugs: Secondary | ICD-10-CM | POA: Diagnosis not present

## 2023-01-25 DIAGNOSIS — R1310 Dysphagia, unspecified: Secondary | ICD-10-CM | POA: Diagnosis not present

## 2023-01-25 DIAGNOSIS — Z466 Encounter for fitting and adjustment of urinary device: Secondary | ICD-10-CM | POA: Diagnosis not present

## 2023-01-25 DIAGNOSIS — R338 Other retention of urine: Secondary | ICD-10-CM | POA: Diagnosis not present

## 2023-01-25 DIAGNOSIS — N182 Chronic kidney disease, stage 2 (mild): Secondary | ICD-10-CM | POA: Diagnosis not present

## 2023-01-25 DIAGNOSIS — I2510 Atherosclerotic heart disease of native coronary artery without angina pectoris: Secondary | ICD-10-CM | POA: Diagnosis not present

## 2023-01-25 DIAGNOSIS — Z87891 Personal history of nicotine dependence: Secondary | ICD-10-CM | POA: Diagnosis not present

## 2023-01-25 DIAGNOSIS — J9601 Acute respiratory failure with hypoxia: Secondary | ICD-10-CM | POA: Diagnosis not present

## 2023-01-25 DIAGNOSIS — Z87440 Personal history of urinary (tract) infections: Secondary | ICD-10-CM | POA: Diagnosis not present

## 2023-01-25 DIAGNOSIS — I129 Hypertensive chronic kidney disease with stage 1 through stage 4 chronic kidney disease, or unspecified chronic kidney disease: Secondary | ICD-10-CM | POA: Diagnosis not present

## 2023-01-25 DIAGNOSIS — H9190 Unspecified hearing loss, unspecified ear: Secondary | ICD-10-CM | POA: Diagnosis not present

## 2023-01-25 DIAGNOSIS — M1990 Unspecified osteoarthritis, unspecified site: Secondary | ICD-10-CM | POA: Diagnosis not present

## 2023-01-25 DIAGNOSIS — Z8673 Personal history of transient ischemic attack (TIA), and cerebral infarction without residual deficits: Secondary | ICD-10-CM | POA: Diagnosis not present

## 2023-01-25 DIAGNOSIS — E1140 Type 2 diabetes mellitus with diabetic neuropathy, unspecified: Secondary | ICD-10-CM | POA: Diagnosis not present

## 2023-01-25 DIAGNOSIS — G4730 Sleep apnea, unspecified: Secondary | ICD-10-CM | POA: Diagnosis not present

## 2023-01-25 DIAGNOSIS — Z794 Long term (current) use of insulin: Secondary | ICD-10-CM | POA: Diagnosis not present

## 2023-01-31 DIAGNOSIS — E1165 Type 2 diabetes mellitus with hyperglycemia: Secondary | ICD-10-CM | POA: Diagnosis not present

## 2023-02-02 DIAGNOSIS — N138 Other obstructive and reflux uropathy: Secondary | ICD-10-CM | POA: Diagnosis not present

## 2023-02-02 DIAGNOSIS — Z466 Encounter for fitting and adjustment of urinary device: Secondary | ICD-10-CM | POA: Diagnosis not present

## 2023-02-02 DIAGNOSIS — H9190 Unspecified hearing loss, unspecified ear: Secondary | ICD-10-CM | POA: Diagnosis not present

## 2023-02-02 DIAGNOSIS — I129 Hypertensive chronic kidney disease with stage 1 through stage 4 chronic kidney disease, or unspecified chronic kidney disease: Secondary | ICD-10-CM | POA: Diagnosis not present

## 2023-02-02 DIAGNOSIS — Z7984 Long term (current) use of oral hypoglycemic drugs: Secondary | ICD-10-CM | POA: Diagnosis not present

## 2023-02-02 DIAGNOSIS — G4730 Sleep apnea, unspecified: Secondary | ICD-10-CM | POA: Diagnosis not present

## 2023-02-02 DIAGNOSIS — J9601 Acute respiratory failure with hypoxia: Secondary | ICD-10-CM | POA: Diagnosis not present

## 2023-02-02 DIAGNOSIS — N182 Chronic kidney disease, stage 2 (mild): Secondary | ICD-10-CM | POA: Diagnosis not present

## 2023-02-02 DIAGNOSIS — Z8673 Personal history of transient ischemic attack (TIA), and cerebral infarction without residual deficits: Secondary | ICD-10-CM | POA: Diagnosis not present

## 2023-02-02 DIAGNOSIS — R1310 Dysphagia, unspecified: Secondary | ICD-10-CM | POA: Diagnosis not present

## 2023-02-02 DIAGNOSIS — Z87440 Personal history of urinary (tract) infections: Secondary | ICD-10-CM | POA: Diagnosis not present

## 2023-02-02 DIAGNOSIS — Z556 Problems related to health literacy: Secondary | ICD-10-CM | POA: Diagnosis not present

## 2023-02-02 DIAGNOSIS — Z87891 Personal history of nicotine dependence: Secondary | ICD-10-CM | POA: Diagnosis not present

## 2023-02-02 DIAGNOSIS — E1140 Type 2 diabetes mellitus with diabetic neuropathy, unspecified: Secondary | ICD-10-CM | POA: Diagnosis not present

## 2023-02-02 DIAGNOSIS — I2510 Atherosclerotic heart disease of native coronary artery without angina pectoris: Secondary | ICD-10-CM | POA: Diagnosis not present

## 2023-02-02 DIAGNOSIS — M1990 Unspecified osteoarthritis, unspecified site: Secondary | ICD-10-CM | POA: Diagnosis not present

## 2023-02-02 DIAGNOSIS — Z794 Long term (current) use of insulin: Secondary | ICD-10-CM | POA: Diagnosis not present

## 2023-02-02 DIAGNOSIS — E1122 Type 2 diabetes mellitus with diabetic chronic kidney disease: Secondary | ICD-10-CM | POA: Diagnosis not present

## 2023-02-02 DIAGNOSIS — R338 Other retention of urine: Secondary | ICD-10-CM | POA: Diagnosis not present

## 2023-02-07 DIAGNOSIS — G4730 Sleep apnea, unspecified: Secondary | ICD-10-CM | POA: Diagnosis not present

## 2023-02-07 DIAGNOSIS — R1310 Dysphagia, unspecified: Secondary | ICD-10-CM | POA: Diagnosis not present

## 2023-02-07 DIAGNOSIS — R338 Other retention of urine: Secondary | ICD-10-CM | POA: Diagnosis not present

## 2023-02-07 DIAGNOSIS — Z794 Long term (current) use of insulin: Secondary | ICD-10-CM | POA: Diagnosis not present

## 2023-02-07 DIAGNOSIS — E1140 Type 2 diabetes mellitus with diabetic neuropathy, unspecified: Secondary | ICD-10-CM | POA: Diagnosis not present

## 2023-02-07 DIAGNOSIS — N138 Other obstructive and reflux uropathy: Secondary | ICD-10-CM | POA: Diagnosis not present

## 2023-02-07 DIAGNOSIS — Z7984 Long term (current) use of oral hypoglycemic drugs: Secondary | ICD-10-CM | POA: Diagnosis not present

## 2023-02-07 DIAGNOSIS — Z87891 Personal history of nicotine dependence: Secondary | ICD-10-CM | POA: Diagnosis not present

## 2023-02-07 DIAGNOSIS — Z8673 Personal history of transient ischemic attack (TIA), and cerebral infarction without residual deficits: Secondary | ICD-10-CM | POA: Diagnosis not present

## 2023-02-07 DIAGNOSIS — J9601 Acute respiratory failure with hypoxia: Secondary | ICD-10-CM | POA: Diagnosis not present

## 2023-02-07 DIAGNOSIS — Z466 Encounter for fitting and adjustment of urinary device: Secondary | ICD-10-CM | POA: Diagnosis not present

## 2023-02-07 DIAGNOSIS — Z556 Problems related to health literacy: Secondary | ICD-10-CM | POA: Diagnosis not present

## 2023-02-07 DIAGNOSIS — I129 Hypertensive chronic kidney disease with stage 1 through stage 4 chronic kidney disease, or unspecified chronic kidney disease: Secondary | ICD-10-CM | POA: Diagnosis not present

## 2023-02-07 DIAGNOSIS — I2510 Atherosclerotic heart disease of native coronary artery without angina pectoris: Secondary | ICD-10-CM | POA: Diagnosis not present

## 2023-02-07 DIAGNOSIS — E1122 Type 2 diabetes mellitus with diabetic chronic kidney disease: Secondary | ICD-10-CM | POA: Diagnosis not present

## 2023-02-07 DIAGNOSIS — Z87440 Personal history of urinary (tract) infections: Secondary | ICD-10-CM | POA: Diagnosis not present

## 2023-02-07 DIAGNOSIS — N182 Chronic kidney disease, stage 2 (mild): Secondary | ICD-10-CM | POA: Diagnosis not present

## 2023-02-07 DIAGNOSIS — H9190 Unspecified hearing loss, unspecified ear: Secondary | ICD-10-CM | POA: Diagnosis not present

## 2023-02-07 DIAGNOSIS — M1990 Unspecified osteoarthritis, unspecified site: Secondary | ICD-10-CM | POA: Diagnosis not present

## 2023-02-14 DIAGNOSIS — I2510 Atherosclerotic heart disease of native coronary artery without angina pectoris: Secondary | ICD-10-CM | POA: Diagnosis not present

## 2023-02-14 DIAGNOSIS — I10 Essential (primary) hypertension: Secondary | ICD-10-CM | POA: Diagnosis not present

## 2023-02-14 DIAGNOSIS — E785 Hyperlipidemia, unspecified: Secondary | ICD-10-CM | POA: Diagnosis not present

## 2023-02-14 DIAGNOSIS — R339 Retention of urine, unspecified: Secondary | ICD-10-CM | POA: Diagnosis not present

## 2023-02-14 DIAGNOSIS — E119 Type 2 diabetes mellitus without complications: Secondary | ICD-10-CM | POA: Diagnosis not present

## 2023-02-14 DIAGNOSIS — K219 Gastro-esophageal reflux disease without esophagitis: Secondary | ICD-10-CM | POA: Diagnosis not present

## 2023-02-14 DIAGNOSIS — M1990 Unspecified osteoarthritis, unspecified site: Secondary | ICD-10-CM | POA: Diagnosis not present

## 2023-02-14 DIAGNOSIS — N182 Chronic kidney disease, stage 2 (mild): Secondary | ICD-10-CM | POA: Diagnosis not present

## 2023-02-14 DIAGNOSIS — G629 Polyneuropathy, unspecified: Secondary | ICD-10-CM | POA: Diagnosis not present

## 2023-02-15 DIAGNOSIS — Z556 Problems related to health literacy: Secondary | ICD-10-CM | POA: Diagnosis not present

## 2023-02-15 DIAGNOSIS — Z466 Encounter for fitting and adjustment of urinary device: Secondary | ICD-10-CM | POA: Diagnosis not present

## 2023-02-15 DIAGNOSIS — I2510 Atherosclerotic heart disease of native coronary artery without angina pectoris: Secondary | ICD-10-CM | POA: Diagnosis not present

## 2023-02-15 DIAGNOSIS — H9190 Unspecified hearing loss, unspecified ear: Secondary | ICD-10-CM | POA: Diagnosis not present

## 2023-02-15 DIAGNOSIS — N138 Other obstructive and reflux uropathy: Secondary | ICD-10-CM | POA: Diagnosis not present

## 2023-02-15 DIAGNOSIS — Z87440 Personal history of urinary (tract) infections: Secondary | ICD-10-CM | POA: Diagnosis not present

## 2023-02-15 DIAGNOSIS — E1140 Type 2 diabetes mellitus with diabetic neuropathy, unspecified: Secondary | ICD-10-CM | POA: Diagnosis not present

## 2023-02-15 DIAGNOSIS — G4730 Sleep apnea, unspecified: Secondary | ICD-10-CM | POA: Diagnosis not present

## 2023-02-15 DIAGNOSIS — R1310 Dysphagia, unspecified: Secondary | ICD-10-CM | POA: Diagnosis not present

## 2023-02-15 DIAGNOSIS — E1122 Type 2 diabetes mellitus with diabetic chronic kidney disease: Secondary | ICD-10-CM | POA: Diagnosis not present

## 2023-02-15 DIAGNOSIS — Z794 Long term (current) use of insulin: Secondary | ICD-10-CM | POA: Diagnosis not present

## 2023-02-15 DIAGNOSIS — I129 Hypertensive chronic kidney disease with stage 1 through stage 4 chronic kidney disease, or unspecified chronic kidney disease: Secondary | ICD-10-CM | POA: Diagnosis not present

## 2023-02-15 DIAGNOSIS — N182 Chronic kidney disease, stage 2 (mild): Secondary | ICD-10-CM | POA: Diagnosis not present

## 2023-02-15 DIAGNOSIS — Z8673 Personal history of transient ischemic attack (TIA), and cerebral infarction without residual deficits: Secondary | ICD-10-CM | POA: Diagnosis not present

## 2023-02-15 DIAGNOSIS — M1990 Unspecified osteoarthritis, unspecified site: Secondary | ICD-10-CM | POA: Diagnosis not present

## 2023-02-15 DIAGNOSIS — Z87891 Personal history of nicotine dependence: Secondary | ICD-10-CM | POA: Diagnosis not present

## 2023-02-15 DIAGNOSIS — R338 Other retention of urine: Secondary | ICD-10-CM | POA: Diagnosis not present

## 2023-02-15 DIAGNOSIS — J9601 Acute respiratory failure with hypoxia: Secondary | ICD-10-CM | POA: Diagnosis not present

## 2023-02-20 DIAGNOSIS — I10 Essential (primary) hypertension: Secondary | ICD-10-CM | POA: Diagnosis not present

## 2023-02-20 DIAGNOSIS — E7849 Other hyperlipidemia: Secondary | ICD-10-CM | POA: Diagnosis not present

## 2023-02-20 DIAGNOSIS — E1165 Type 2 diabetes mellitus with hyperglycemia: Secondary | ICD-10-CM | POA: Diagnosis not present

## 2023-02-20 DIAGNOSIS — I2510 Atherosclerotic heart disease of native coronary artery without angina pectoris: Secondary | ICD-10-CM | POA: Diagnosis not present

## 2023-02-20 DIAGNOSIS — E038 Other specified hypothyroidism: Secondary | ICD-10-CM | POA: Diagnosis not present

## 2023-02-20 DIAGNOSIS — D518 Other vitamin B12 deficiency anemias: Secondary | ICD-10-CM | POA: Diagnosis not present

## 2023-02-20 DIAGNOSIS — E1100 Type 2 diabetes mellitus with hyperosmolarity without nonketotic hyperglycemic-hyperosmolar coma (NKHHC): Secondary | ICD-10-CM | POA: Diagnosis not present

## 2023-02-20 DIAGNOSIS — E785 Hyperlipidemia, unspecified: Secondary | ICD-10-CM | POA: Diagnosis not present

## 2023-02-20 DIAGNOSIS — E559 Vitamin D deficiency, unspecified: Secondary | ICD-10-CM | POA: Diagnosis not present

## 2023-02-20 DIAGNOSIS — E119 Type 2 diabetes mellitus without complications: Secondary | ICD-10-CM | POA: Diagnosis not present

## 2023-02-22 DIAGNOSIS — Z87440 Personal history of urinary (tract) infections: Secondary | ICD-10-CM | POA: Diagnosis not present

## 2023-02-22 DIAGNOSIS — Z556 Problems related to health literacy: Secondary | ICD-10-CM | POA: Diagnosis not present

## 2023-02-22 DIAGNOSIS — E1122 Type 2 diabetes mellitus with diabetic chronic kidney disease: Secondary | ICD-10-CM | POA: Diagnosis not present

## 2023-02-22 DIAGNOSIS — N182 Chronic kidney disease, stage 2 (mild): Secondary | ICD-10-CM | POA: Diagnosis not present

## 2023-02-22 DIAGNOSIS — H9190 Unspecified hearing loss, unspecified ear: Secondary | ICD-10-CM | POA: Diagnosis not present

## 2023-02-22 DIAGNOSIS — Z466 Encounter for fitting and adjustment of urinary device: Secondary | ICD-10-CM | POA: Diagnosis not present

## 2023-02-22 DIAGNOSIS — Z794 Long term (current) use of insulin: Secondary | ICD-10-CM | POA: Diagnosis not present

## 2023-02-22 DIAGNOSIS — M1990 Unspecified osteoarthritis, unspecified site: Secondary | ICD-10-CM | POA: Diagnosis not present

## 2023-02-22 DIAGNOSIS — I129 Hypertensive chronic kidney disease with stage 1 through stage 4 chronic kidney disease, or unspecified chronic kidney disease: Secondary | ICD-10-CM | POA: Diagnosis not present

## 2023-02-22 DIAGNOSIS — Z87891 Personal history of nicotine dependence: Secondary | ICD-10-CM | POA: Diagnosis not present

## 2023-02-22 DIAGNOSIS — Z8673 Personal history of transient ischemic attack (TIA), and cerebral infarction without residual deficits: Secondary | ICD-10-CM | POA: Diagnosis not present

## 2023-02-22 DIAGNOSIS — R338 Other retention of urine: Secondary | ICD-10-CM | POA: Diagnosis not present

## 2023-02-22 DIAGNOSIS — G4730 Sleep apnea, unspecified: Secondary | ICD-10-CM | POA: Diagnosis not present

## 2023-02-22 DIAGNOSIS — I2510 Atherosclerotic heart disease of native coronary artery without angina pectoris: Secondary | ICD-10-CM | POA: Diagnosis not present

## 2023-02-22 DIAGNOSIS — N138 Other obstructive and reflux uropathy: Secondary | ICD-10-CM | POA: Diagnosis not present

## 2023-02-22 DIAGNOSIS — E1140 Type 2 diabetes mellitus with diabetic neuropathy, unspecified: Secondary | ICD-10-CM | POA: Diagnosis not present

## 2023-02-22 DIAGNOSIS — R1310 Dysphagia, unspecified: Secondary | ICD-10-CM | POA: Diagnosis not present

## 2023-02-22 DIAGNOSIS — J9601 Acute respiratory failure with hypoxia: Secondary | ICD-10-CM | POA: Diagnosis not present

## 2023-03-03 DIAGNOSIS — E1165 Type 2 diabetes mellitus with hyperglycemia: Secondary | ICD-10-CM | POA: Diagnosis not present

## 2023-03-14 DIAGNOSIS — I2510 Atherosclerotic heart disease of native coronary artery without angina pectoris: Secondary | ICD-10-CM | POA: Diagnosis not present

## 2023-03-14 DIAGNOSIS — I10 Essential (primary) hypertension: Secondary | ICD-10-CM | POA: Diagnosis not present

## 2023-03-14 DIAGNOSIS — M1990 Unspecified osteoarthritis, unspecified site: Secondary | ICD-10-CM | POA: Diagnosis not present

## 2023-03-14 DIAGNOSIS — N182 Chronic kidney disease, stage 2 (mild): Secondary | ICD-10-CM | POA: Diagnosis not present

## 2023-03-14 DIAGNOSIS — E119 Type 2 diabetes mellitus without complications: Secondary | ICD-10-CM | POA: Diagnosis not present

## 2023-03-14 DIAGNOSIS — K219 Gastro-esophageal reflux disease without esophagitis: Secondary | ICD-10-CM | POA: Diagnosis not present

## 2023-03-14 DIAGNOSIS — G629 Polyneuropathy, unspecified: Secondary | ICD-10-CM | POA: Diagnosis not present

## 2023-03-14 DIAGNOSIS — R339 Retention of urine, unspecified: Secondary | ICD-10-CM | POA: Diagnosis not present

## 2023-03-18 DIAGNOSIS — D518 Other vitamin B12 deficiency anemias: Secondary | ICD-10-CM | POA: Diagnosis not present

## 2023-03-18 DIAGNOSIS — E1100 Type 2 diabetes mellitus with hyperosmolarity without nonketotic hyperglycemic-hyperosmolar coma (NKHHC): Secondary | ICD-10-CM | POA: Diagnosis not present

## 2023-03-18 DIAGNOSIS — E785 Hyperlipidemia, unspecified: Secondary | ICD-10-CM | POA: Diagnosis not present

## 2023-03-18 DIAGNOSIS — I10 Essential (primary) hypertension: Secondary | ICD-10-CM | POA: Diagnosis not present

## 2023-03-18 DIAGNOSIS — E559 Vitamin D deficiency, unspecified: Secondary | ICD-10-CM | POA: Diagnosis not present

## 2023-03-18 DIAGNOSIS — E1165 Type 2 diabetes mellitus with hyperglycemia: Secondary | ICD-10-CM | POA: Diagnosis not present

## 2023-03-18 DIAGNOSIS — I2510 Atherosclerotic heart disease of native coronary artery without angina pectoris: Secondary | ICD-10-CM | POA: Diagnosis not present

## 2023-03-18 DIAGNOSIS — E7849 Other hyperlipidemia: Secondary | ICD-10-CM | POA: Diagnosis not present

## 2023-03-18 DIAGNOSIS — E119 Type 2 diabetes mellitus without complications: Secondary | ICD-10-CM | POA: Diagnosis not present

## 2023-03-18 DIAGNOSIS — E038 Other specified hypothyroidism: Secondary | ICD-10-CM | POA: Diagnosis not present

## 2023-03-22 ENCOUNTER — Emergency Department (HOSPITAL_COMMUNITY)
Admission: EM | Admit: 2023-03-22 | Discharge: 2023-03-22 | Disposition: A | Discharge status: Home / Self Care | Payer: Medicare Other | Attending: Emergency Medicine | Admitting: Emergency Medicine

## 2023-03-22 ENCOUNTER — Other Ambulatory Visit: Payer: Self-pay

## 2023-03-22 ENCOUNTER — Encounter (HOSPITAL_COMMUNITY): Payer: Self-pay

## 2023-03-22 DIAGNOSIS — R1310 Dysphagia, unspecified: Secondary | ICD-10-CM | POA: Diagnosis not present

## 2023-03-22 DIAGNOSIS — N4889 Other specified disorders of penis: Secondary | ICD-10-CM | POA: Diagnosis not present

## 2023-03-22 DIAGNOSIS — Z7982 Long term (current) use of aspirin: Secondary | ICD-10-CM | POA: Diagnosis not present

## 2023-03-22 DIAGNOSIS — Z87440 Personal history of urinary (tract) infections: Secondary | ICD-10-CM | POA: Diagnosis not present

## 2023-03-22 DIAGNOSIS — I2510 Atherosclerotic heart disease of native coronary artery without angina pectoris: Secondary | ICD-10-CM | POA: Diagnosis not present

## 2023-03-22 DIAGNOSIS — Z8673 Personal history of transient ischemic attack (TIA), and cerebral infarction without residual deficits: Secondary | ICD-10-CM | POA: Insufficient documentation

## 2023-03-22 DIAGNOSIS — Z87891 Personal history of nicotine dependence: Secondary | ICD-10-CM | POA: Diagnosis not present

## 2023-03-22 DIAGNOSIS — Z7984 Long term (current) use of oral hypoglycemic drugs: Secondary | ICD-10-CM | POA: Insufficient documentation

## 2023-03-22 DIAGNOSIS — F0390 Unspecified dementia without behavioral disturbance: Secondary | ICD-10-CM | POA: Insufficient documentation

## 2023-03-22 DIAGNOSIS — E119 Type 2 diabetes mellitus without complications: Secondary | ICD-10-CM | POA: Insufficient documentation

## 2023-03-22 DIAGNOSIS — R338 Other retention of urine: Secondary | ICD-10-CM | POA: Diagnosis not present

## 2023-03-22 DIAGNOSIS — R531 Weakness: Secondary | ICD-10-CM | POA: Diagnosis not present

## 2023-03-22 DIAGNOSIS — J9601 Acute respiratory failure with hypoxia: Secondary | ICD-10-CM | POA: Diagnosis not present

## 2023-03-22 DIAGNOSIS — Z556 Problems related to health literacy: Secondary | ICD-10-CM | POA: Diagnosis not present

## 2023-03-22 DIAGNOSIS — G4730 Sleep apnea, unspecified: Secondary | ICD-10-CM | POA: Diagnosis not present

## 2023-03-22 DIAGNOSIS — N138 Other obstructive and reflux uropathy: Secondary | ICD-10-CM | POA: Diagnosis not present

## 2023-03-22 DIAGNOSIS — Z794 Long term (current) use of insulin: Secondary | ICD-10-CM | POA: Diagnosis not present

## 2023-03-22 DIAGNOSIS — I129 Hypertensive chronic kidney disease with stage 1 through stage 4 chronic kidney disease, or unspecified chronic kidney disease: Secondary | ICD-10-CM | POA: Diagnosis not present

## 2023-03-22 DIAGNOSIS — N182 Chronic kidney disease, stage 2 (mild): Secondary | ICD-10-CM | POA: Diagnosis not present

## 2023-03-22 DIAGNOSIS — R5381 Other malaise: Secondary | ICD-10-CM | POA: Diagnosis not present

## 2023-03-22 DIAGNOSIS — H9190 Unspecified hearing loss, unspecified ear: Secondary | ICD-10-CM | POA: Diagnosis not present

## 2023-03-22 DIAGNOSIS — E1122 Type 2 diabetes mellitus with diabetic chronic kidney disease: Secondary | ICD-10-CM | POA: Diagnosis not present

## 2023-03-22 DIAGNOSIS — R369 Urethral discharge, unspecified: Secondary | ICD-10-CM | POA: Diagnosis not present

## 2023-03-22 DIAGNOSIS — Z743 Need for continuous supervision: Secondary | ICD-10-CM | POA: Diagnosis not present

## 2023-03-22 DIAGNOSIS — Z466 Encounter for fitting and adjustment of urinary device: Secondary | ICD-10-CM | POA: Diagnosis not present

## 2023-03-22 DIAGNOSIS — E1140 Type 2 diabetes mellitus with diabetic neuropathy, unspecified: Secondary | ICD-10-CM | POA: Diagnosis not present

## 2023-03-22 DIAGNOSIS — M1990 Unspecified osteoarthritis, unspecified site: Secondary | ICD-10-CM | POA: Diagnosis not present

## 2023-03-22 MED ORDER — LIDOCAINE HCL URETHRAL/MUCOSAL 2 % EX GEL
1.0000 | Freq: Once | CUTANEOUS | Status: AC
  Administered 2023-03-22: 1 via URETHRAL

## 2023-03-22 NOTE — ED Provider Notes (Cosign Needed Addendum)
[place] EMERGENCY DEPARTMENT AT [facility] Provider Note   CSN: [id number] Arrival date & time: 03/22/23  1445     History  Chief Complaint  Patient presents with   Penile Discharge    Albert Garrison is a 77 y.o. male.  He has PMH of dementia, type 2 diabetes, BPH with chronic urinary retention, CVA, cardiac arrest.  Presents the ER today for penile bleeding.  Nursing home removed his Foley today to change it and state he had bleeding after removal and they were worried and did not want to replace it.  Sent him to the ED for replacement.  He is not on blood thinners.  Complaining of some pain in his penis.  No abdominal pain, no vomiting.    Penile Discharge       Home Medications Prior to Admission medications   Medication Sig Start Date End Date Taking? Authorizing Provider  amLODipine (NORVASC) 10 MG tablet Take 1 tablet (10 mg total) by mouth daily. 01/11/21   Catarina Hartshorn, MD  aspirin 81 MG chewable tablet Chew 1 tablet (81 mg total) by mouth daily. 01/11/21   Catarina Hartshorn, MD  atorvastatin (LIPITOR) 40 MG tablet Take 1 tablet (40 mg total) by mouth daily. 01/11/21   Catarina Hartshorn, MD  finasteride (PROSCAR) 5 MG tablet Take 1 tablet (5 mg total) by mouth daily. 05/09/22   Jerilee Field, MD  haloperidol (HALDOL) 0.5 MG tablet Take 1 tablet (0.5 mg total) by mouth 2 (two) times daily. 01/11/21 02/10/21  Catarina Hartshorn, MD  HUMALOG KWIKPEN 100 UNIT/ML KwikPen Inject into the skin. 11/22/21   [provider]  magnesium oxide (MAG-OX) 400 (240 Mg) MG tablet Take 1 tablet (400 mg total) by mouth daily. 01/12/21   Catarina Hartshorn, MD  memantine (NAMENDA) 10 MG tablet Take 10 mg by mouth daily. 11/23/21   [provider]  metFORMIN (GLUCOPHAGE) 500 MG tablet Take 1 tablet (500 mg total) by mouth daily with breakfast. Patient taking differently: Take 500 mg by mouth daily with breakfast. Pt taking bid 01/11/21   Tat, Onalee Hua, MD  metoprolol tartrate (LOPRESSOR) 25 MG  tablet Take 0.5 tablets (12.5 mg total) by mouth 2 (two) times daily. 01/11/21   Catarina Hartshorn, MD  mirtazapine (REMERON) 7.5 MG tablet Take 7.5 mg by mouth at bedtime. 07/06/21   [provider]  Multiple Vitamin (MULTIVITAMIN WITH MINERALS) TABS tablet Take 1 tablet by mouth daily. 01/11/21   Catarina Hartshorn, MD  silodosin (RAPAFLO) 8 MG CAPS capsule Take 8 mg by mouth daily. 08/07/21   [provider]      Allergies    Patient has no known allergies.    Review of Systems   Review of Systems  Genitourinary:  Positive for penile discharge.    Physical Exam Updated Vital Signs BP (!) 109/57   Pulse (!) 43   Temp 97.8 F (36.6 C) (Oral)   Resp 16   Ht 5\' 11"  (1.803 m)   Wt 78 kg   SpO2 95%   BMI 23.98 kg/m  Physical Exam Vitals and nursing note reviewed.  Constitutional:      General: He is not in acute distress.    Appearance: He is well-developed.  HENT:     Head: Normocephalic and atraumatic.     Mouth/Throat:     Mouth: Mucous membranes are moist.  Eyes:     Conjunctiva/sclera: Conjunctivae normal.  Cardiovascular:     Rate and Rhythm: Normal rate and  regular rhythm.     Heart sounds: No murmur heard. Pulmonary:     Effort: Pulmonary effort is normal. No respiratory distress.     Breath sounds: Normal breath sounds.  Abdominal:     Palpations: Abdomen is soft.     Tenderness: There is no abdominal tenderness.  Genitourinary:    Comments: Small amount of blood trickling from urethral meatus, no obvious trauma or skin breakdown of the foreskin. Exam chaperoned by Lenis Dickinson RN Musculoskeletal:        General: No swelling.     Cervical back: Neck supple.  Skin:    General: Skin is warm and dry.     Capillary Refill: Capillary refill takes less than 2 seconds.  Neurological:     General: No focal deficit present.     Mental Status: He is alert. Mental status is at baseline.  Psychiatric:        Mood and Affect: Mood normal.     ED Results / Procedures /  Treatments   Labs (all labs ordered are listed, but only abnormal results are displayed) Labs Reviewed - No data to display  EKG None  Radiology No results found.  Procedures Procedures    Medications Ordered in ED Medications  lidocaine (XYLOCAINE) 2 % jelly 1 Application (1 Application Urethral Given 03/22/23 1534)    ED Course/ Medical Decision Making/ A&P                                 Medical Decision Making DDx: Urethral trauma, UTI, urinary outlet obstruction, other Course: Patient presents to ER for bleeding from his penis after removal of Foley catheter, bleeding was very slow trickle upon arrival though there had been more bleeding as there were some clots noted in his diaper.  Foley catheter was replaced by RN, initially patient had some bleeding around the catheter, resolved.  Catheter is draining clear yellow urine at this time.  Blood pressure is 114/60 heart rate is 52, patient is alert and at his mental baseline, feel he is stable for discharge.  Denies any pain at this time.  Amount and/or Complexity of Data Reviewed External Data Reviewed: notes.           Final Clinical Impression(s) / ED Diagnoses Final diagnoses:  Penile bleeding    Rx / DC Orders ED Discharge Orders     None         Ma Rings, PA-C 03/22/23 1636    Ma Rings, PA-C 03/22/23 1641    Glendora Score, MD 03/23/23 1308

## 2023-03-22 NOTE — ED Triage Notes (Signed)
Pt BIB ems from [facility] for bleeding from penis after a foley removal. Per EMS staff at facility was uncomfortable replacing catheter since patient was bleeding. Bleeding has subsided at this time.

## 2023-03-22 NOTE — Discharge Instructions (Addendum)
Pleasure taking care of you today.  You are seen for bleeding from your penis after removing Foley catheter.  The bleeding is stopped and your Foley catheter was replaced and draining normally.  Follow-up with urology, come back to the ER for new or worsening symptoms.  Avoid any unnecessary pulling on the Foley catheter and make sure follow-up is fully deflated for removal.

## 2023-03-23 ENCOUNTER — Emergency Department (HOSPITAL_COMMUNITY)
Admission: EM | Admit: 2023-03-23 | Discharge: 2023-03-23 | Payer: Medicare Other | Attending: Emergency Medicine | Admitting: Emergency Medicine

## 2023-03-23 ENCOUNTER — Encounter (HOSPITAL_COMMUNITY): Payer: Self-pay

## 2023-03-23 DIAGNOSIS — R58 Hemorrhage, not elsewhere classified: Secondary | ICD-10-CM | POA: Diagnosis not present

## 2023-03-23 DIAGNOSIS — Z743 Need for continuous supervision: Secondary | ICD-10-CM | POA: Diagnosis not present

## 2023-03-23 DIAGNOSIS — R5381 Other malaise: Secondary | ICD-10-CM | POA: Diagnosis not present

## 2023-03-23 DIAGNOSIS — R310 Gross hematuria: Secondary | ICD-10-CM | POA: Diagnosis not present

## 2023-03-23 DIAGNOSIS — Z7401 Bed confinement status: Secondary | ICD-10-CM | POA: Diagnosis not present

## 2023-03-23 DIAGNOSIS — Z7982 Long term (current) use of aspirin: Secondary | ICD-10-CM | POA: Diagnosis not present

## 2023-03-23 DIAGNOSIS — Z978 Presence of other specified devices: Secondary | ICD-10-CM

## 2023-03-23 DIAGNOSIS — Z466 Encounter for fitting and adjustment of urinary device: Secondary | ICD-10-CM | POA: Insufficient documentation

## 2023-03-23 DIAGNOSIS — R369 Urethral discharge, unspecified: Secondary | ICD-10-CM | POA: Diagnosis present

## 2023-03-23 DIAGNOSIS — T83091A Other mechanical complication of indwelling urethral catheter, initial encounter: Secondary | ICD-10-CM | POA: Diagnosis not present

## 2023-03-23 DIAGNOSIS — R0682 Tachypnea, not elsewhere classified: Secondary | ICD-10-CM | POA: Diagnosis not present

## 2023-03-23 LAB — URINALYSIS, W/ REFLEX TO CULTURE (INFECTION SUSPECTED)
Bilirubin Urine: NEGATIVE
Glucose, UA: NEGATIVE mg/dL
Ketones, ur: NEGATIVE mg/dL
Nitrite: NEGATIVE
Protein, ur: 100 mg/dL — AB
Specific Gravity, Urine: 1.008 (ref 1.005–1.030)
pH: 6 (ref 5.0–8.0)

## 2023-03-23 LAB — CBC WITH DIFFERENTIAL/PLATELET
Abs Immature Granulocytes: 0.02 10*3/uL (ref 0.00–0.07)
Basophils Absolute: 0.1 10*3/uL (ref 0.0–0.1)
Basophils Relative: 1 %
Eosinophils Absolute: 0.2 10*3/uL (ref 0.0–0.5)
Eosinophils Relative: 2 %
HCT: 39.2 % (ref 39.0–52.0)
Hemoglobin: 13.2 g/dL (ref 13.0–17.0)
Immature Granulocytes: 0 %
Lymphocytes Relative: 36 %
Lymphs Abs: 2.9 10*3/uL (ref 0.7–4.0)
MCH: 31.2 pg (ref 26.0–34.0)
MCHC: 33.7 g/dL (ref 30.0–36.0)
MCV: 92.7 fL (ref 80.0–100.0)
Monocytes Absolute: 0.8 10*3/uL (ref 0.1–1.0)
Monocytes Relative: 10 %
Neutro Abs: 4.1 10*3/uL (ref 1.7–7.7)
Neutrophils Relative %: 51 %
Platelets: 224 10*3/uL (ref 150–400)
RBC: 4.23 MIL/uL (ref 4.22–5.81)
RDW: 13.6 % (ref 11.5–15.5)
WBC: 8.1 10*3/uL (ref 4.0–10.5)
nRBC: 0 % (ref 0.0–0.2)

## 2023-03-23 LAB — COMPREHENSIVE METABOLIC PANEL
ALT: 17 U/L (ref 0–44)
AST: 20 U/L (ref 15–41)
Albumin: 3.4 g/dL — ABNORMAL LOW (ref 3.5–5.0)
Alkaline Phosphatase: 66 U/L (ref 38–126)
Anion gap: 11 (ref 5–15)
BUN: 16 mg/dL (ref 8–23)
CO2: 22 mmol/L (ref 22–32)
Calcium: 9.1 mg/dL (ref 8.9–10.3)
Chloride: 104 mmol/L (ref 98–111)
Creatinine, Ser: 1.08 mg/dL (ref 0.61–1.24)
GFR, Estimated: >60 mL/min (ref >60)
Glucose, Bld: 88 mg/dL (ref 70–99)
Potassium: 3.9 mmol/L (ref 3.5–5.1)
Sodium: 137 mmol/L (ref 135–145)
Total Bilirubin: 0.8 mg/dL (ref <1.2)
Total Protein: 6.3 g/dL — ABNORMAL LOW (ref 6.5–8.1)

## 2023-03-23 LAB — PROTIME-INR
INR: 1.1 (ref 0.8–1.2)
Prothrombin Time: 14 s (ref 11.4–15.2)

## 2023-03-23 NOTE — ED Provider Notes (Signed)
[place] EMERGENCY DEPARTMENT AT [facility] Provider Note   CSN: [id number] Arrival date & time: 03/23/23  0055     History  Chief Complaint  Patient presents with   Penile Discharge    PATRIK GRUDZINSKI is a 77 y.o. male.  77 year old male who presents the ER today for blood around his catheter site.  Patient had been in the ER recently after traumatic Foley insertion attempt at his facility.  He does have bleeding and was sent in earlier in the day was observed and sent home with no longer bleeding.  Apparently patient had some more blood again when he got back to the facility so I sent him straight back here.  Patient without any complaints besides low bit of burning and pain in that area but overall is asymptomatic.   Penile Discharge       Home Medications Prior to Admission medications   Medication Sig Start Date End Date Taking? Authorizing Provider  amLODipine (NORVASC) 10 MG tablet Take 1 tablet (10 mg total) by mouth daily. 01/11/21   Catarina Hartshorn, MD  aspirin 81 MG chewable tablet Chew 1 tablet (81 mg total) by mouth daily. 01/11/21   Catarina Hartshorn, MD  atorvastatin (LIPITOR) 40 MG tablet Take 1 tablet (40 mg total) by mouth daily. 01/11/21   Catarina Hartshorn, MD  finasteride (PROSCAR) 5 MG tablet Take 1 tablet (5 mg total) by mouth daily. 05/09/22   Jerilee Field, MD  haloperidol (HALDOL) 0.5 MG tablet Take 1 tablet (0.5 mg total) by mouth 2 (two) times daily. 01/11/21 02/10/21  Catarina Hartshorn, MD  HUMALOG KWIKPEN 100 UNIT/ML KwikPen Inject into the skin. 11/22/21   [provider]  magnesium oxide (MAG-OX) 400 (240 Mg) MG tablet Take 1 tablet (400 mg total) by mouth daily. 01/12/21   Catarina Hartshorn, MD  memantine (NAMENDA) 10 MG tablet Take 10 mg by mouth daily. 11/23/21   [provider]  metFORMIN (GLUCOPHAGE) 500 MG tablet Take 1 tablet (500 mg total) by mouth daily with breakfast. Patient taking differently: Take 500 mg by mouth daily with breakfast.  Pt taking bid 01/11/21   Tat, Onalee Hua, MD  metoprolol tartrate (LOPRESSOR) 25 MG tablet Take 0.5 tablets (12.5 mg total) by mouth 2 (two) times daily. 01/11/21   Catarina Hartshorn, MD  mirtazapine (REMERON) 7.5 MG tablet Take 7.5 mg by mouth at bedtime. 07/06/21   [provider]  Multiple Vitamin (MULTIVITAMIN WITH MINERALS) TABS tablet Take 1 tablet by mouth daily. 01/11/21   Catarina Hartshorn, MD  silodosin (RAPAFLO) 8 MG CAPS capsule Take 8 mg by mouth daily. 08/07/21   [provider]      Allergies    Patient has no known allergies.    Review of Systems   Review of Systems  Genitourinary:  Positive for penile discharge.    Physical Exam Updated Vital Signs BP 124/79 (BP Location: Right Arm)   Pulse (!) 55   Temp 97.7 F (36.5 C)   Resp 17   Ht 5\' 11"  (1.803 m)   Wt 78 kg   SpO2 99%   BMI 23.98 kg/m  Physical Exam Vitals and nursing note reviewed.  Constitutional:      Appearance: He is well-developed.  HENT:     Head: Normocephalic and atraumatic.  Cardiovascular:     Rate and Rhythm: Normal rate.  Pulmonary:     Effort: Pulmonary effort is normal. No respiratory distress.  Abdominal:     General: There  is no distension.  Genitourinary:    Comments: Patient has not dark blood in his depends but is coagulated no active bleeding on my exam.  Catheter seems to be placed appropriately.  Draining clear yellow urine.  Moves freely within his urethra. Musculoskeletal:        General: Normal range of motion.     Cervical back: Normal range of motion.  Neurological:     Mental Status: He is alert.     ED Results / Procedures / Treatments   Labs (all labs ordered are listed, but only abnormal results are displayed) Labs Reviewed  COMPREHENSIVE METABOLIC PANEL - Abnormal; Notable for the following components:      Result Value   Total Protein 6.3 (*)    Albumin 3.4 (*)    All other components within normal limits  URINALYSIS, W/ REFLEX TO CULTURE (INFECTION  SUSPECTED) - Abnormal; Notable for the following components:   Hgb urine dipstick LARGE (*)    Protein, ur 100 (*)    Leukocytes,Ua LARGE (*)    Bacteria, UA RARE (*)    All other components within normal limits  URINE CULTURE  CBC WITH DIFFERENTIAL/PLATELET  PROTIME-INR    EKG None  Radiology No results found.  Procedures Procedures    Medications Ordered in ED Medications - No data to display  ED Course/ Medical Decision Making/ A&P                                 Medical Decision Making Amount and/or Complexity of Data Reviewed Labs: ordered.  Will check kidney function and make sure is not obstructed, hemoglobin make sure he has not hemorrhage too much blood, urine and urine culture.  Low likelihood of treatment unless blatantly obvious to be an infection as I think his blood is related to the traumatic Foley.  Will observe for hemostasis.  I think that the blood coming around his catheter is from the traumatic insertion that he had earlier in the day on Wednesday.  The treatment for a traumatic insertion is having a Foley in place and thus he is receiving the appropriate treatment.  I would expect some mild oozing off and on over the next couple days as the wounds heal.  Please ensure there is enough slack in the catheter tubing so that he does not have recurrent trauma.  However anytime that it does move within urethra it could cause some mild bleeding and this would be expected.    He has a culture pending to make sure is not infectious by think that is less likely.  If he continues to have oozing off and on for more than 3 days he should follow-up with his urologist, if he does not have one I have put the information for our urologist who is on-call right now.  His labs are otherwise stable and has no significant amount of blood loss also with stable vital signs and thus I do not think he needs further workup here in the emergency room unless things change significantly or  there are new concerns he can return here for repeat evaluation.   Final Clinical Impression(s) / ED Diagnoses Final diagnoses:  Foley catheter in place  Gross hematuria    Rx / DC Orders ED Discharge Orders     None         Shavontae Gibeault, Barbara Cower, MD 03/23/23 [phone]

## 2023-03-23 NOTE — ED Triage Notes (Signed)
[facility] from facility- [facility]- pt was just discharged a couple hours ago, CC of bleeding around catheter. D/c for the same. Still bleeding.

## 2023-03-23 NOTE — Discharge Instructions (Addendum)
I think that the blood coming around his catheter is from the traumatic insertion that he had earlier in the day on Wednesday.  The treatment for a traumatic insertion is having a Foley in place and thus he is receiving the appropriate treatment.  I would expect some mild oozing off and on over the next couple days as the wounds heal.  Please ensure there is enough slack in the catheter tubing so that he does not have recurrent trauma.  However anytime that it does move within urethra it could cause some mild bleeding and this would be expected.    He has a culture pending to make sure is not infectious by think that is less likely.  If he continues to have oozing off and on for more than 3 days he should follow-up with his urologist, if he does not have 1 I have put the information for our urologist who is on-call right now.  His labs are otherwise stable and has no significant amount of blood loss also with stable vital signs and thus I do not think he needs further workup here in the emergency room unless things change significantly or there are new concerns he can return here for repeat evaluation.

## 2023-03-24 DIAGNOSIS — E782 Mixed hyperlipidemia: Secondary | ICD-10-CM | POA: Diagnosis not present

## 2023-03-24 DIAGNOSIS — D519 Vitamin B12 deficiency anemia, unspecified: Secondary | ICD-10-CM | POA: Diagnosis not present

## 2023-03-24 DIAGNOSIS — E038 Other specified hypothyroidism: Secondary | ICD-10-CM | POA: Diagnosis not present

## 2023-03-24 DIAGNOSIS — E119 Type 2 diabetes mellitus without complications: Secondary | ICD-10-CM | POA: Diagnosis not present

## 2023-03-24 DIAGNOSIS — Z79899 Other long term (current) drug therapy: Secondary | ICD-10-CM | POA: Diagnosis not present

## 2023-03-24 LAB — URINE CULTURE: Culture: NO GROWTH

## 2023-03-25 ENCOUNTER — Other Ambulatory Visit: Payer: Self-pay

## 2023-03-25 ENCOUNTER — Encounter (HOSPITAL_COMMUNITY): Payer: Self-pay | Admitting: Emergency Medicine

## 2023-03-25 ENCOUNTER — Emergency Department (HOSPITAL_COMMUNITY)
Admission: EM | Admit: 2023-03-25 | Discharge: 2023-03-25 | Disposition: A | Discharge status: Home / Self Care | Payer: Medicare Other | Attending: Emergency Medicine | Admitting: Emergency Medicine

## 2023-03-25 DIAGNOSIS — Z8673 Personal history of transient ischemic attack (TIA), and cerebral infarction without residual deficits: Secondary | ICD-10-CM | POA: Insufficient documentation

## 2023-03-25 DIAGNOSIS — E1122 Type 2 diabetes mellitus with diabetic chronic kidney disease: Secondary | ICD-10-CM | POA: Diagnosis not present

## 2023-03-25 DIAGNOSIS — Z7982 Long term (current) use of aspirin: Secondary | ICD-10-CM | POA: Insufficient documentation

## 2023-03-25 DIAGNOSIS — N368 Other specified disorders of urethra: Secondary | ICD-10-CM | POA: Insufficient documentation

## 2023-03-25 DIAGNOSIS — R319 Hematuria, unspecified: Secondary | ICD-10-CM | POA: Diagnosis not present

## 2023-03-25 DIAGNOSIS — T83098A Other mechanical complication of other indwelling urethral catheter, initial encounter: Secondary | ICD-10-CM | POA: Diagnosis not present

## 2023-03-25 DIAGNOSIS — R0689 Other abnormalities of breathing: Secondary | ICD-10-CM | POA: Diagnosis not present

## 2023-03-25 DIAGNOSIS — R531 Weakness: Secondary | ICD-10-CM | POA: Diagnosis not present

## 2023-03-25 DIAGNOSIS — Z794 Long term (current) use of insulin: Secondary | ICD-10-CM | POA: Diagnosis not present

## 2023-03-25 DIAGNOSIS — N182 Chronic kidney disease, stage 2 (mild): Secondary | ICD-10-CM | POA: Diagnosis not present

## 2023-03-25 DIAGNOSIS — Z79899 Other long term (current) drug therapy: Secondary | ICD-10-CM | POA: Diagnosis not present

## 2023-03-25 DIAGNOSIS — Z7984 Long term (current) use of oral hypoglycemic drugs: Secondary | ICD-10-CM | POA: Insufficient documentation

## 2023-03-25 DIAGNOSIS — I129 Hypertensive chronic kidney disease with stage 1 through stage 4 chronic kidney disease, or unspecified chronic kidney disease: Secondary | ICD-10-CM | POA: Diagnosis not present

## 2023-03-25 DIAGNOSIS — Z743 Need for continuous supervision: Secondary | ICD-10-CM | POA: Diagnosis not present

## 2023-03-25 DIAGNOSIS — R58 Hemorrhage, not elsewhere classified: Secondary | ICD-10-CM | POA: Diagnosis not present

## 2023-03-25 DIAGNOSIS — F0390 Unspecified dementia without behavioral disturbance: Secondary | ICD-10-CM | POA: Diagnosis not present

## 2023-03-25 DIAGNOSIS — R6889 Other general symptoms and signs: Secondary | ICD-10-CM | POA: Diagnosis not present

## 2023-03-25 NOTE — ED Provider Notes (Signed)
[place] EMERGENCY DEPARTMENT AT [facility] Provider Note   CSN: [id number] Arrival date & time: 03/25/23  1459     History  Chief Complaint  Patient presents with   Vascular Access Problem    Albert Garrison is a 77 y.o. male.  HPI Patient presents with bleeding around his Foley catheter.  Now his fourth visit for the same.  Had reported traumatic Foley insertion at the nursing home.  On the 18th was seen for some penile bleeding.  Workup reassuring.  Seen the next day when they sent him back.  Patient has dementia and cannot really provide much history besides saying his had some bleeding.  Seen last night at [facility] for the same.  Discharged back to nursing home.  Now sent in today.   Past Medical History:  Diagnosis Date   Arthritis    BPH (benign prostatic hyperplasia)    CKD (chronic kidney disease), stage II    Complicated UTI (urinary tract infection) 03/2014   Depression    GERD (gastroesophageal reflux disease)    History of pneumonia    History of stroke    HOH (hard of hearing)    Hypertension    Hyponatremia    Lower GI bleed 2017   a. ? due to polyp.   NSVT (nonsustained ventricular tachycardia) (HCC)    Rheumatic fever    Childhood   Sleep apnea    Does not use CPAP   Type 2 diabetes mellitus (HCC)     Home Medications Prior to Admission medications   Medication Sig Start Date End Date Taking? Authorizing Provider  amLODipine (NORVASC) 10 MG tablet Take 1 tablet (10 mg total) by mouth daily. 01/11/21   Catarina Hartshorn, MD  aspirin 81 MG chewable tablet Chew 1 tablet (81 mg total) by mouth daily. 01/11/21   Catarina Hartshorn, MD  atorvastatin (LIPITOR) 40 MG tablet Take 1 tablet (40 mg total) by mouth daily. 01/11/21   Catarina Hartshorn, MD  finasteride (PROSCAR) 5 MG tablet Take 1 tablet (5 mg total) by mouth daily. 05/09/22   Jerilee Field, MD  haloperidol (HALDOL) 0.5 MG tablet Take 1 tablet (0.5 mg total) by mouth 2 (two) times daily. 01/11/21  02/10/21  Catarina Hartshorn, MD  HUMALOG KWIKPEN 100 UNIT/ML KwikPen Inject into the skin. 11/22/21   [provider]  magnesium oxide (MAG-OX) 400 (240 Mg) MG tablet Take 1 tablet (400 mg total) by mouth daily. 01/12/21   Catarina Hartshorn, MD  memantine (NAMENDA) 10 MG tablet Take 10 mg by mouth daily. 11/23/21   [provider]  metFORMIN (GLUCOPHAGE) 500 MG tablet Take 1 tablet (500 mg total) by mouth daily with breakfast. Patient taking differently: Take 500 mg by mouth daily with breakfast. Pt taking bid 01/11/21   Tat, Onalee Hua, MD  metoprolol tartrate (LOPRESSOR) 25 MG tablet Take 0.5 tablets (12.5 mg total) by mouth 2 (two) times daily. 01/11/21   Catarina Hartshorn, MD  mirtazapine (REMERON) 7.5 MG tablet Take 7.5 mg by mouth at bedtime. 07/06/21   [provider]  Multiple Vitamin (MULTIVITAMIN WITH MINERALS) TABS tablet Take 1 tablet by mouth daily. 01/11/21   Catarina Hartshorn, MD  silodosin (RAPAFLO) 8 MG CAPS capsule Take 8 mg by mouth daily. 08/07/21   [provider]      Allergies    Patient has no known allergies.    Review of Systems   Review of Systems  Physical Exam Updated Vital Signs BP (!) 143/56 (  BP Location: Left Arm)   Pulse (!) 51   Temp 98.1 F (36.7 C) (Oral)   Resp 17   SpO2 97%  Physical Exam Vitals reviewed.  Constitutional:      Appearance: Normal appearance.  Abdominal:     Tenderness: There is no abdominal tenderness.  Genitourinary:    Comments: Foley catheter in place.  Some dried blood around it.  Normal urine in catheter. Skin:    General: Skin is warm.     ED Results / Procedures / Treatments   Labs (all labs ordered are listed, but only abnormal results are displayed) Labs Reviewed - No data to display  EKG None  Radiology No results found.  Procedures Procedures    Medications Ordered in ED Medications - No data to display  ED Course/ Medical Decision Making/ A&P                                 Medical Decision  Making  Patient with blood around Foley catheter.  Urinating through catheter and urine looks good.  May have urethral injury due to traumatic Foley insertion.  However vitals reassuring.  Foley catheter working.  At this point do not think we need further ER workup.  Can follow with urology.  Will discharge back to nursing home.        Final Clinical Impression(s) / ED Diagnoses Final diagnoses:  Transient iatrogenic urethral bleeding    Rx / DC Orders ED Discharge Orders     None         Benjiman Core, MD 03/25/23 1555

## 2023-03-25 NOTE — Discharge Instructions (Addendum)
There is some blood coming around the catheter.  However the does not appear to be a large amount.  Follow-up with urology.  Follow-up sooner if bleeding continues.  Return for large amounts of bleeding or inability to empty the catheter.

## 2023-03-25 NOTE — ED Triage Notes (Signed)
Pt from [facility] with reports of blood leaking around his foley catheter. Pt has no other complaints at this time.

## 2023-03-28 ENCOUNTER — Encounter (HOSPITAL_COMMUNITY): Payer: Self-pay | Admitting: *Deleted

## 2023-03-28 ENCOUNTER — Emergency Department (HOSPITAL_COMMUNITY)
Admission: EM | Admit: 2023-03-28 | Discharge: 2023-03-28 | Disposition: A | Discharge status: Home / Self Care | Payer: Medicare Other | Attending: Emergency Medicine | Admitting: Emergency Medicine

## 2023-03-28 ENCOUNTER — Other Ambulatory Visit: Payer: Self-pay

## 2023-03-28 DIAGNOSIS — Z7982 Long term (current) use of aspirin: Secondary | ICD-10-CM | POA: Diagnosis not present

## 2023-03-28 DIAGNOSIS — N368 Other specified disorders of urethra: Secondary | ICD-10-CM | POA: Diagnosis not present

## 2023-03-28 DIAGNOSIS — Z794 Long term (current) use of insulin: Secondary | ICD-10-CM | POA: Insufficient documentation

## 2023-03-28 DIAGNOSIS — R5381 Other malaise: Secondary | ICD-10-CM | POA: Diagnosis not present

## 2023-03-28 DIAGNOSIS — Z743 Need for continuous supervision: Secondary | ICD-10-CM | POA: Diagnosis not present

## 2023-03-28 DIAGNOSIS — R339 Retention of urine, unspecified: Secondary | ICD-10-CM | POA: Diagnosis present

## 2023-03-28 DIAGNOSIS — R58 Hemorrhage, not elsewhere classified: Secondary | ICD-10-CM | POA: Diagnosis not present

## 2023-03-28 DIAGNOSIS — F0390 Unspecified dementia without behavioral disturbance: Secondary | ICD-10-CM | POA: Insufficient documentation

## 2023-03-28 DIAGNOSIS — R001 Bradycardia, unspecified: Secondary | ICD-10-CM | POA: Diagnosis not present

## 2023-03-28 DIAGNOSIS — I499 Cardiac arrhythmia, unspecified: Secondary | ICD-10-CM | POA: Diagnosis not present

## 2023-03-28 HISTORY — DX: Shock, unspecified: R57.9

## 2023-03-28 HISTORY — DX: Cardiac arrest, cause unspecified: I46.9

## 2023-03-28 HISTORY — DX: Dysphagia, unspecified: R13.10

## 2023-03-28 LAB — BASIC METABOLIC PANEL
Anion gap: 5 (ref 5–15)
BUN: 15 mg/dL (ref 8–23)
CO2: 25 mmol/L (ref 22–32)
Calcium: 8.9 mg/dL (ref 8.9–10.3)
Chloride: 104 mmol/L (ref 98–111)
Creatinine, Ser: 1.21 mg/dL (ref 0.61–1.24)
GFR, Estimated: >60 mL/min (ref >60)
Glucose, Bld: 113 mg/dL — ABNORMAL HIGH (ref 70–99)
Potassium: 4.4 mmol/L (ref 3.5–5.1)
Sodium: 134 mmol/L — ABNORMAL LOW (ref 135–145)

## 2023-03-28 LAB — CBC WITH DIFFERENTIAL/PLATELET
Abs Immature Granulocytes: 0.02 10*3/uL (ref 0.00–0.07)
Basophils Absolute: 0 10*3/uL (ref 0.0–0.1)
Basophils Relative: 1 %
Eosinophils Absolute: 0.1 10*3/uL (ref 0.0–0.5)
Eosinophils Relative: 2 %
HCT: 35.3 % — ABNORMAL LOW (ref 39.0–52.0)
Hemoglobin: 11.9 g/dL — ABNORMAL LOW (ref 13.0–17.0)
Immature Granulocytes: 0 %
Lymphocytes Relative: 30 %
Lymphs Abs: 2.1 10*3/uL (ref 0.7–4.0)
MCH: 31.4 pg (ref 26.0–34.0)
MCHC: 33.7 g/dL (ref 30.0–36.0)
MCV: 93.1 fL (ref 80.0–100.0)
Monocytes Absolute: 0.7 10*3/uL (ref 0.1–1.0)
Monocytes Relative: 9 %
Neutro Abs: 4.1 10*3/uL (ref 1.7–7.7)
Neutrophils Relative %: 58 %
Platelets: 227 10*3/uL (ref 150–400)
RBC: 3.79 MIL/uL — ABNORMAL LOW (ref 4.22–5.81)
RDW: 13.7 % (ref 11.5–15.5)
WBC: 7 10*3/uL (ref 4.0–10.5)
nRBC: 0 % (ref 0.0–0.2)

## 2023-03-28 NOTE — ED Triage Notes (Signed)
Pt brought in by [facility] from [facility] with c/o blood in his urine causing his foley catheter to occlude. EMS reports pt has had this issue multiple times recently.

## 2023-03-28 NOTE — ED Notes (Signed)
Nurse able to get in touch with [facility] about transport, they will call nurse back with transport abilities.

## 2023-03-28 NOTE — ED Provider Notes (Signed)
[place] EMERGENCY DEPARTMENT AT [facility] Provider Note   CSN: [id number] Arrival date & time: 03/28/23  1035     History  No chief complaint on file.   Albert Garrison is a 77 y.o. male.  He has PMH of dementia, BPH, has indwelling Foley catheter for urinary retention.  Presents for bleeding around the Foley catheter.  This started 6 days ago, patient seen in the ER at that time, Foley catheter replaced, thought to be traumatic removal, or possibly patient had pulled on the catheter.  Catheter was replaced, draining clean urine, patient was sent back from his facility later that night, urine culture showed no growth, patient sent back to the nursing home, his had several more visits, 5 including today in total for continued bleeding of the urethra.  He has not follow-up with urology yet.  HPI     Home Medications Prior to Admission medications   Medication Sig Start Date End Date Taking? Authorizing Provider  amLODipine (NORVASC) 10 MG tablet Take 1 tablet (10 mg total) by mouth daily. 01/11/21   Catarina Hartshorn, MD  aspirin 81 MG chewable tablet Chew 1 tablet (81 mg total) by mouth daily. 01/11/21   Catarina Hartshorn, MD  atorvastatin (LIPITOR) 40 MG tablet Take 1 tablet (40 mg total) by mouth daily. 01/11/21   Catarina Hartshorn, MD  finasteride (PROSCAR) 5 MG tablet Take 1 tablet (5 mg total) by mouth daily. 05/09/22   Jerilee Field, MD  haloperidol (HALDOL) 0.5 MG tablet Take 1 tablet (0.5 mg total) by mouth 2 (two) times daily. 01/11/21 02/10/21  Catarina Hartshorn, MD  HUMALOG KWIKPEN 100 UNIT/ML KwikPen Inject into the skin. 11/22/21   [provider]  magnesium oxide (MAG-OX) 400 (240 Mg) MG tablet Take 1 tablet (400 mg total) by mouth daily. 01/12/21   Catarina Hartshorn, MD  memantine (NAMENDA) 10 MG tablet Take 10 mg by mouth daily. 11/23/21   [provider]  metFORMIN (GLUCOPHAGE) 500 MG tablet Take 1 tablet (500 mg total) by mouth daily with breakfast. Patient taking  differently: Take 500 mg by mouth daily with breakfast. Pt taking bid 01/11/21   Tat, Onalee Hua, MD  metoprolol tartrate (LOPRESSOR) 25 MG tablet Take 0.5 tablets (12.5 mg total) by mouth 2 (two) times daily. 01/11/21   Catarina Hartshorn, MD  mirtazapine (REMERON) 7.5 MG tablet Take 7.5 mg by mouth at bedtime. 07/06/21   [provider]  Multiple Vitamin (MULTIVITAMIN WITH MINERALS) TABS tablet Take 1 tablet by mouth daily. 01/11/21   Catarina Hartshorn, MD  silodosin (RAPAFLO) 8 MG CAPS capsule Take 8 mg by mouth daily. 08/07/21   [provider]      Allergies    Patient has no known allergies.    Review of Systems   Review of Systems  Physical Exam Updated Vital Signs BP (!) 106/59 (BP Location: Right Arm)   Pulse (!) 50   Temp 97.9 F (36.6 C) (Oral)   Resp 14   Ht 5\' 11"  (1.803 m)   Wt 78 kg   SpO2 96%   BMI 23.98 kg/m  Physical Exam Vitals and nursing note reviewed.  Constitutional:      General: He is not in acute distress.    Appearance: He is well-developed.  HENT:     Head: Normocephalic and atraumatic.  Eyes:     Conjunctiva/sclera: Conjunctivae normal.  Cardiovascular:     Rate and Rhythm: Normal rate and regular rhythm.     Heart  sounds: No murmur heard. Pulmonary:     Effort: Pulmonary effort is normal. No respiratory distress.     Breath sounds: Normal breath sounds.  Abdominal:     Palpations: Abdomen is soft.     Tenderness: There is no abdominal tenderness.  Genitourinary:    Comments: Fully catheter is draining clear yellow urine, few clots around the urethral meatus but after cleaning these no continued bleeding throughout ED stay.  There are no wounds noted. Musculoskeletal:        General: No swelling.     Cervical back: Neck supple.  Skin:    General: Skin is warm and dry.     Capillary Refill: Capillary refill takes less than 2 seconds.  Neurological:     Mental Status: He is alert.  Psychiatric:        Mood and Affect: Mood normal.     ED  Results / Procedures / Treatments   Labs (all labs ordered are listed, but only abnormal results are displayed) Labs Reviewed  CBC WITH DIFFERENTIAL/PLATELET - Abnormal; Notable for the following components:      Result Value   RBC 3.79 (*)    Hemoglobin 11.9 (*)    HCT 35.3 (*)    All other components within normal limits  BASIC METABOLIC PANEL - Abnormal; Notable for the following components:   Sodium 134 (*)    Glucose, Bld 113 (*)    All other components within normal limits    EKG None  Radiology No results found.  Procedures Procedures    Medications Ordered in ED Medications - No data to display  ED Course/ Medical Decision Making/ A&P                                 Medical Decision Making DDx: Hematuria, urethral trauma, UTI, other  ED course: Patient sent yet again for continued bleeding at the urethral meatus.  No active bleeding in the ED.  The nurse did note that his leg bag was very tight causing pulling on the catheter which may be contributing.  Labs drawn as patient has not had any labs in 5 days, hemoglobin 11.9, had been 18.2.  Otherwise normal, patient has no complaints.  I consulted urology and spoke with Dr. Laverle Patter who suggested no further intervention is needed just close outpatient urology follow-up with whom he is established which is Dr. Mena Goes.  I attempted to call patient's family members who are listed several times as well as the nursing home several times without answer to inform of the plan of close urology follow-up, we have loosened the leg bag to prevent unnecessary pulling on the catheter and advised them that some continued oozing is not reason for coming back to the ER, unnecessary trips can cause more harm than good.  Plan to discuss return precautions this is heavy bleeding, dizziness, fever, severe pain.  Otherwise need to follow-up with urology.  We are continuing to try to contact them.  Amount and/or Complexity of Data  Reviewed External Data Reviewed: labs and notes. Labs: ordered. Decision-making details documented in ED Course.           Final Clinical Impression(s) / ED Diagnoses Final diagnoses:  Urethral bleeding    Rx / DC Orders ED Discharge Orders     None         Josem Kaufmann 03/28/23 1219    Bethann Berkshire, MD  03/31/23 1053  

## 2023-03-28 NOTE — Discharge Instructions (Signed)
You were seen today for bleeding from your urethra.  You likely had some injury during one of your Foley catheter changes when this first started or possibly have tugged on it on accident.  Keeping the Foley catheter tucked inside your clothing can help prevent accidentally tugging on it.  We did blood work, your hemoglobin was just very slightly low.  It is extremely important for you to follow-up with urology.  I spoke with the urologist on-call today.  There is no need for any emergent procedures.  Unless the Foley catheter is blocked off or there are any other concerning symptoms having some oozing from the urethra may continue.  It is important to call the urologist.  Call either today or Thursday to schedule close follow-up appointment.

## 2023-04-02 DIAGNOSIS — E1165 Type 2 diabetes mellitus with hyperglycemia: Secondary | ICD-10-CM | POA: Diagnosis not present

## 2023-04-04 DIAGNOSIS — R369 Urethral discharge, unspecified: Secondary | ICD-10-CM | POA: Diagnosis not present

## 2023-04-04 DIAGNOSIS — N182 Chronic kidney disease, stage 2 (mild): Secondary | ICD-10-CM | POA: Diagnosis not present

## 2023-04-04 DIAGNOSIS — R319 Hematuria, unspecified: Secondary | ICD-10-CM | POA: Diagnosis not present

## 2023-04-07 DIAGNOSIS — I129 Hypertensive chronic kidney disease with stage 1 through stage 4 chronic kidney disease, or unspecified chronic kidney disease: Secondary | ICD-10-CM | POA: Diagnosis not present

## 2023-04-07 DIAGNOSIS — Z466 Encounter for fitting and adjustment of urinary device: Secondary | ICD-10-CM | POA: Diagnosis not present

## 2023-04-07 DIAGNOSIS — I2510 Atherosclerotic heart disease of native coronary artery without angina pectoris: Secondary | ICD-10-CM | POA: Diagnosis not present

## 2023-04-07 DIAGNOSIS — N182 Chronic kidney disease, stage 2 (mild): Secondary | ICD-10-CM | POA: Diagnosis not present

## 2023-04-07 DIAGNOSIS — Z794 Long term (current) use of insulin: Secondary | ICD-10-CM | POA: Diagnosis not present

## 2023-04-07 DIAGNOSIS — Z87440 Personal history of urinary (tract) infections: Secondary | ICD-10-CM | POA: Diagnosis not present

## 2023-04-07 DIAGNOSIS — Z87891 Personal history of nicotine dependence: Secondary | ICD-10-CM | POA: Diagnosis not present

## 2023-04-07 DIAGNOSIS — J9601 Acute respiratory failure with hypoxia: Secondary | ICD-10-CM | POA: Diagnosis not present

## 2023-04-07 DIAGNOSIS — G4730 Sleep apnea, unspecified: Secondary | ICD-10-CM | POA: Diagnosis not present

## 2023-04-07 DIAGNOSIS — R1310 Dysphagia, unspecified: Secondary | ICD-10-CM | POA: Diagnosis not present

## 2023-04-07 DIAGNOSIS — Z8673 Personal history of transient ischemic attack (TIA), and cerebral infarction without residual deficits: Secondary | ICD-10-CM | POA: Diagnosis not present

## 2023-04-07 DIAGNOSIS — N138 Other obstructive and reflux uropathy: Secondary | ICD-10-CM | POA: Diagnosis not present

## 2023-04-07 DIAGNOSIS — E1140 Type 2 diabetes mellitus with diabetic neuropathy, unspecified: Secondary | ICD-10-CM | POA: Diagnosis not present

## 2023-04-07 DIAGNOSIS — R338 Other retention of urine: Secondary | ICD-10-CM | POA: Diagnosis not present

## 2023-04-07 DIAGNOSIS — E1122 Type 2 diabetes mellitus with diabetic chronic kidney disease: Secondary | ICD-10-CM | POA: Diagnosis not present

## 2023-04-07 DIAGNOSIS — Z556 Problems related to health literacy: Secondary | ICD-10-CM | POA: Diagnosis not present

## 2023-04-07 DIAGNOSIS — H9190 Unspecified hearing loss, unspecified ear: Secondary | ICD-10-CM | POA: Diagnosis not present

## 2023-04-07 DIAGNOSIS — M1990 Unspecified osteoarthritis, unspecified site: Secondary | ICD-10-CM | POA: Diagnosis not present

## 2023-04-11 DIAGNOSIS — M1990 Unspecified osteoarthritis, unspecified site: Secondary | ICD-10-CM | POA: Diagnosis not present

## 2023-04-11 DIAGNOSIS — N1831 Chronic kidney disease, stage 3a: Secondary | ICD-10-CM | POA: Diagnosis not present

## 2023-04-11 DIAGNOSIS — I10 Essential (primary) hypertension: Secondary | ICD-10-CM | POA: Diagnosis not present

## 2023-04-11 DIAGNOSIS — R339 Retention of urine, unspecified: Secondary | ICD-10-CM | POA: Diagnosis not present

## 2023-04-11 DIAGNOSIS — I2510 Atherosclerotic heart disease of native coronary artery without angina pectoris: Secondary | ICD-10-CM | POA: Diagnosis not present

## 2023-04-11 DIAGNOSIS — G629 Polyneuropathy, unspecified: Secondary | ICD-10-CM | POA: Diagnosis not present

## 2023-04-11 DIAGNOSIS — E785 Hyperlipidemia, unspecified: Secondary | ICD-10-CM | POA: Diagnosis not present

## 2023-04-11 DIAGNOSIS — K219 Gastro-esophageal reflux disease without esophagitis: Secondary | ICD-10-CM | POA: Diagnosis not present

## 2023-04-11 DIAGNOSIS — N189 Chronic kidney disease, unspecified: Secondary | ICD-10-CM | POA: Diagnosis not present

## 2023-04-13 DIAGNOSIS — N182 Chronic kidney disease, stage 2 (mild): Secondary | ICD-10-CM | POA: Diagnosis not present

## 2023-04-13 DIAGNOSIS — G4730 Sleep apnea, unspecified: Secondary | ICD-10-CM | POA: Diagnosis not present

## 2023-04-13 DIAGNOSIS — M1990 Unspecified osteoarthritis, unspecified site: Secondary | ICD-10-CM | POA: Diagnosis not present

## 2023-04-13 DIAGNOSIS — H9190 Unspecified hearing loss, unspecified ear: Secondary | ICD-10-CM | POA: Diagnosis not present

## 2023-04-13 DIAGNOSIS — R1310 Dysphagia, unspecified: Secondary | ICD-10-CM | POA: Diagnosis not present

## 2023-04-13 DIAGNOSIS — Z556 Problems related to health literacy: Secondary | ICD-10-CM | POA: Diagnosis not present

## 2023-04-13 DIAGNOSIS — Z794 Long term (current) use of insulin: Secondary | ICD-10-CM | POA: Diagnosis not present

## 2023-04-13 DIAGNOSIS — Z87891 Personal history of nicotine dependence: Secondary | ICD-10-CM | POA: Diagnosis not present

## 2023-04-13 DIAGNOSIS — E1122 Type 2 diabetes mellitus with diabetic chronic kidney disease: Secondary | ICD-10-CM | POA: Diagnosis not present

## 2023-04-13 DIAGNOSIS — E1140 Type 2 diabetes mellitus with diabetic neuropathy, unspecified: Secondary | ICD-10-CM | POA: Diagnosis not present

## 2023-04-13 DIAGNOSIS — R338 Other retention of urine: Secondary | ICD-10-CM | POA: Diagnosis not present

## 2023-04-13 DIAGNOSIS — I129 Hypertensive chronic kidney disease with stage 1 through stage 4 chronic kidney disease, or unspecified chronic kidney disease: Secondary | ICD-10-CM | POA: Diagnosis not present

## 2023-04-13 DIAGNOSIS — Z8673 Personal history of transient ischemic attack (TIA), and cerebral infarction without residual deficits: Secondary | ICD-10-CM | POA: Diagnosis not present

## 2023-04-13 DIAGNOSIS — Z466 Encounter for fitting and adjustment of urinary device: Secondary | ICD-10-CM | POA: Diagnosis not present

## 2023-04-13 DIAGNOSIS — Z87440 Personal history of urinary (tract) infections: Secondary | ICD-10-CM | POA: Diagnosis not present

## 2023-04-13 DIAGNOSIS — Z7984 Long term (current) use of oral hypoglycemic drugs: Secondary | ICD-10-CM | POA: Diagnosis not present

## 2023-04-13 DIAGNOSIS — I2510 Atherosclerotic heart disease of native coronary artery without angina pectoris: Secondary | ICD-10-CM | POA: Diagnosis not present

## 2023-04-13 DIAGNOSIS — N138 Other obstructive and reflux uropathy: Secondary | ICD-10-CM | POA: Diagnosis not present

## 2023-04-14 DIAGNOSIS — R338 Other retention of urine: Secondary | ICD-10-CM | POA: Diagnosis not present

## 2023-04-14 DIAGNOSIS — E1122 Type 2 diabetes mellitus with diabetic chronic kidney disease: Secondary | ICD-10-CM | POA: Diagnosis not present

## 2023-04-14 DIAGNOSIS — I129 Hypertensive chronic kidney disease with stage 1 through stage 4 chronic kidney disease, or unspecified chronic kidney disease: Secondary | ICD-10-CM | POA: Diagnosis not present

## 2023-04-14 DIAGNOSIS — G4730 Sleep apnea, unspecified: Secondary | ICD-10-CM | POA: Diagnosis not present

## 2023-04-14 DIAGNOSIS — E1140 Type 2 diabetes mellitus with diabetic neuropathy, unspecified: Secondary | ICD-10-CM | POA: Diagnosis not present

## 2023-04-18 ENCOUNTER — Telehealth: Payer: Self-pay

## 2023-04-18 DIAGNOSIS — Z978 Presence of other specified devices: Secondary | ICD-10-CM | POA: Insufficient documentation

## 2023-04-18 NOTE — Progress Notes (Signed)
Name: Albert Garrison DOB: April 21, 1945 MRN: [id number]  History of Present Illness: Albert Garrison is a 78 y.o. male who presents today at [facility]. He is accompanied by French Ana (PCA / med tech from [facility]), who assisted with giving history due to patient's dementia. Patient's brother Albert Garrison (medical POA) participated via speaker phone. - GU history: 1. BPH.  - Prostate was 109 g on renal US in 2023.  - 01/20/2015: Underwent TUIP by Dr. Annabell Howells.  - Previously took Flomax.  - Taking daily Rapaflo 8 mg and Proscar 5 mg (since February 2024). 2. Neurogenic hypotonic bladder with chronic urinary retention. - 09/13/2021: Urodynamic testing showed a postvoid residual of 1300 mL, decreased bladder sensation with first sensation at 1000 mL, no instability, no leakage, no voluntary contraction and inability to void. - 10/04/2021: RUS negative for hydronephrosis.   At last visit on 10/18/2022: - Not a candidate for CIC due to his dementia.  - Discussed options including long term use of indwelling Foley catheter versus SP tube. - The plan was: 1. Continue indwelling Foley catheter.  2. Orders placed for home health nurses to exchange catheter once every 4 weeks.  3. Return in about 6 months (around 04/20/2023) for f/u with Evette Georges, NP.  Since last visit: - Has had multiple visits to the ER for bleeding at urethral meatus / around the Foley catheter. Possibly due to traumatic removal or patient possibly pulling on the catheter.  - 03/28/2023 ER note: "I consulted urology and spoke with Dr. Laverle Patter who suggested no further intervention is needed...we have loosened the leg bag to prevent unnecessary pulling on the catheter and advised them that some continued oozing is not reason for coming back to the ER, unnecessary trips can cause more harm than good. Plan to discuss return precautions this is heavy bleeding, dizziness, fever, severe pain. Otherwise need to  follow-up with urology."  Today: Reports urine leakage around the catheter, which is otherwise draining well. He denies flank pain or abdominal pain.   Fall Screening: Do you usually have a device to assist in your mobility? No   Medications: Current Outpatient Medications  Medication Sig Dispense Refill   amLODipine (NORVASC) 10 MG tablet Take 1 tablet (10 mg total) by mouth daily. 30 tablet 1   aspirin 81 MG chewable tablet Chew 1 tablet (81 mg total) by mouth daily. 30 tablet 1   atorvastatin (LIPITOR) 40 MG tablet Take 1 tablet (40 mg total) by mouth daily. 30 tablet 1   finasteride (PROSCAR) 5 MG tablet Take 1 tablet (5 mg total) by mouth daily. 90 tablet 3   HUMALOG KWIKPEN 100 UNIT/ML KwikPen Inject into the skin.     magnesium oxide (MAG-OX) 400 (240 Mg) MG tablet Take 1 tablet (400 mg total) by mouth daily. 30 tablet 1   memantine (NAMENDA) 10 MG tablet Take 10 mg by mouth daily.     metFORMIN (GLUCOPHAGE) 500 MG tablet Take 1 tablet (500 mg total) by mouth daily with breakfast. (Patient taking differently: Take 500 mg by mouth daily with breakfast. Pt taking bid) 30 tablet 1   metoprolol tartrate (LOPRESSOR) 25 MG tablet Take 0.5 tablets (12.5 mg total) by mouth 2 (two) times daily. 30 tablet 1   mirtazapine (REMERON) 7.5 MG tablet Take 7.5 mg by mouth at bedtime.     Multiple Vitamin (MULTIVITAMIN WITH MINERALS) TABS tablet Take 1 tablet by mouth daily. 30 tablet 1  silodosin (RAPAFLO) 8 MG CAPS capsule Take 8 mg by mouth daily.     haloperidol (HALDOL) 0.5 MG tablet Take 1 tablet (0.5 mg total) by mouth 2 (two) times daily. 60 tablet 1   Vibegron (GEMTESA) 75 MG TABS Take 1 tablet (75 mg total) by mouth daily. 30 tablet 11   No current facility-administered medications for this visit.    Allergies: No Known Allergies  Past Medical History:  Diagnosis Date   Arthritis    BPH (benign prostatic hyperplasia)    Cardiac arrest (HCC)    CKD (chronic kidney disease), stage  II    Complicated UTI (urinary tract infection) 03/2014   Depression    Dysphagia    GERD (gastroesophageal reflux disease)    History of pneumonia    History of stroke    HOH (hard of hearing)    Hypertension    Hyponatremia    Lower GI bleed 2017   a. ? due to polyp.   NSVT (nonsustained ventricular tachycardia) (HCC)    Rheumatic fever    Childhood   Shock circulatory (HCC)    Sleep apnea    Does not use CPAP   Type 2 diabetes mellitus (HCC)    Past Surgical History:  Procedure Laterality Date   CARPAL TUNNEL RELEASE Left 2010   CARPAL TUNNEL RELEASE Right 07/13/2017   Procedure: CARPAL TUNNEL RELEASE;  Surgeon: Vickki Hearing, MD;  Location: [facility];  Service: Orthopedics;  Laterality: Right;   CERVICAL SPINE SURGERY  2010   COLONOSCOPY WITH PROPOFOL N/A 11/30/2015   Procedure: COLONOSCOPY WITH PROPOFOL;  Surgeon: Corbin Ade, MD;  Location: [facility];  Service: Endoscopy;  Laterality: N/A;   KNEE ARTHROSCOPY  1989   LUMBAR LAMINECTOMY  1986   Nose surgery - broken nose     POLYPECTOMY  11/30/2015   Procedure: POLYPECTOMY;  Surgeon: Corbin Ade, MD;  Location: [facility];  Service: Endoscopy;;  polyp at ascending colon, rectal polyp   TRANSURETHRAL INCISION OF PROSTATE N/A 01/20/2015   Procedure: TRANSURETHRAL INCISION OF THE PROSTATE (TUIP);  Surgeon: Bjorn Pippin, MD;  Location: [facility];  Service: Urology;  Laterality: N/A;   Family History  Problem Relation Age of Onset   Heart disease Sister    Hypertension Sister    Social History   Socioeconomic History   Marital status: Divorced    Spouse name: Not on file   Number of children: Not on file   Years of education: Not on file   Highest education level: Not on file  Occupational History   Not on file  Tobacco Use   Smoking status: Former    Current packs/day: 0.00    Average packs/day: 1.5 packs/day for 14.0 years (21.0 ttl pk-yrs)    Types: Cigarettes    Start date: 08/05/1961    Quit date:  08/06/1975    Years since quitting: 47.7   Smokeless tobacco: Never  Vaping Use   Vaping status: Never Used  Substance and Sexual Activity   Alcohol use: Not Currently    Alcohol/week: 3.0 standard drinks of alcohol    Types: 3 Cans of beer per week    Comment: 3 12 oz cans of beer each week.None since 03/2014   Drug use: No   Sexual activity: Yes    Birth control/protection: None  Other Topics Concern   Not on file  Social History Narrative   Not on file   Social Drivers of Health  Financial Resource Strain: Not on file  Food Insecurity: Not on file  Transportation Needs: Not on file  Physical Activity: Not on file  Stress: Not on file  Social Connections: Not on file  Intimate Partner Violence: Not on file    Review of Systems - Limited due to patient dementia Constitutional: Patient denies change in strength lntegumentary: Patient denies any rashes   GU: As per HPI.  OBJECTIVE Vitals:   04/24/23 1051  BP: 116/68  Pulse: (!) 52  Temp: 97.8 F (36.6 C)   There is no height or weight on file to calculate BMI.  Physical Examination Constitutional: No obvious distress; patient is non-toxic appearing  Cardiovascular: No visible lower extremity edema.  Respiratory: The patient does not have audible wheezing/stridor; respirations do not appear labored  Gastrointestinal: Abdomen non-distended Musculoskeletal: Normal ROM of UEs  Skin: No obvious rashes/open sores  Neurologic: CN 2-12 grossly intact Psychiatric: Dementia, pleasant Hematologic/Lymphatic/Immunologic: No obvious bruises or sites of spontaneous bleeding  GU: Catheter draining clear yellow urine.  ASSESSMENT Neurogenic bladder - Plan: Ambulatory referral to Interventional Radiology, Vibegron (GEMTESA) 75 MG TABS, DISCONTINUED: Vibegron (GEMTESA) 75 MG TABS  Chronic retention of urine - Plan: Ambulatory referral to Interventional Radiology  Chronic indwelling Foley catheter - Plan: Ambulatory referral  to Interventional Radiology  Dementia, unspecified dementia severity, unspecified dementia type, unspecified whether behavioral, psychotic, or mood disturbance or anxiety (HCC) - Plan: Ambulatory referral to Interventional Radiology  We reviewed the diagnosis of neurogenic bladder with chronic urinary retention requiring perpetual catheter use. Discussed that urine leakage around the catheter may be due to bladder spasms and/or urethral sphincter erosion in the setting of long term indwelling Foley catheter. Advised Gemtesa 75 mg daily to minimize risk for bladder spasms along with suprapubic (SP) tube/catheter placement by IR. We discussed the potential risks and benefits of those treatment options. Mr. Huesman and his brother agreed to proceed as advised. All questions were answered.  PLAN Advised the following: 1. Gemtesa 75 mg daily. 2. Referred to IR for suprapubic (SP) tube/catheter placement. 3. Follow up with Urology 2 weeks after SP tube placement.  Orders Placed This Encounter  Procedures   Ambulatory referral to Interventional Radiology    Referral Priority:   Routine    Referral Type:   Consultation    Referral Reason:   Specialty Services Required    Requested Specialty:   Interventional Radiology    Number of Visits Requested:   1   Total time spent caring for the patient today was over 30 minutes. This includes time spent on the date of the visit reviewing the patient's chart before the visit, time spent during the visit, and time spent after the visit on documentation. Over 50% of that time was spent in face-to-face time with this patient for direct counseling. E&M based on time and complexity of medical decision making.  It has been explained that the patient is to follow regularly with their PCP in addition to all other providers involved in their care and to follow instructions provided by these respective offices. Patient advised to contact urology clinic if any  urologic-pertaining questions, concerns, new symptoms or problems arise in the interim period.  There are no Patient Instructions on file for this visit.  Electronically signed by:  Donnita Falls, FNP   04/24/23    11:31 AM

## 2023-04-18 NOTE — Telephone Encounter (Signed)
-----   Message from Lauraine JAYSON Oz sent at 04/18/2023 11:14 AM EST ----- [id number]:  This is a patient with dementia coming next Monday (04/24/23) from SNF. Last time only CNA accompanied him and we were never able to reach his brother Pacey Altizer who is his medical POA and lives >2 hrs away in [place], [place] (phone # [phone]). Patient also apparently has 2 sisters who live locally in [place]. Please advise family / SNF that patient's medical POA must be present for this visit as patient is unable to provide consent, otherwise we will be unable to proceed with his visit that day.  AlanBETHA LIPS in case we have to decline to see patient next Monday due to absence of POA / inability to contact POA in patient who has dementia & cannot consent or give history.

## 2023-04-18 NOTE — Telephone Encounter (Signed)
 Tried calling patient's brother Jonny Ruiz [facility]) with no answer, left vm for return call. Tried calling Charlott Rakes patient's aunt with no answer, left vm for return call to office.

## 2023-04-20 DIAGNOSIS — Z466 Encounter for fitting and adjustment of urinary device: Secondary | ICD-10-CM | POA: Diagnosis not present

## 2023-04-20 DIAGNOSIS — G4730 Sleep apnea, unspecified: Secondary | ICD-10-CM | POA: Diagnosis not present

## 2023-04-20 DIAGNOSIS — R338 Other retention of urine: Secondary | ICD-10-CM | POA: Diagnosis not present

## 2023-04-20 DIAGNOSIS — I129 Hypertensive chronic kidney disease with stage 1 through stage 4 chronic kidney disease, or unspecified chronic kidney disease: Secondary | ICD-10-CM | POA: Diagnosis not present

## 2023-04-20 DIAGNOSIS — E1140 Type 2 diabetes mellitus with diabetic neuropathy, unspecified: Secondary | ICD-10-CM | POA: Diagnosis not present

## 2023-04-20 DIAGNOSIS — Z87440 Personal history of urinary (tract) infections: Secondary | ICD-10-CM | POA: Diagnosis not present

## 2023-04-20 DIAGNOSIS — I2510 Atherosclerotic heart disease of native coronary artery without angina pectoris: Secondary | ICD-10-CM | POA: Diagnosis not present

## 2023-04-20 DIAGNOSIS — Z794 Long term (current) use of insulin: Secondary | ICD-10-CM | POA: Diagnosis not present

## 2023-04-20 DIAGNOSIS — Z8673 Personal history of transient ischemic attack (TIA), and cerebral infarction without residual deficits: Secondary | ICD-10-CM | POA: Diagnosis not present

## 2023-04-20 DIAGNOSIS — N138 Other obstructive and reflux uropathy: Secondary | ICD-10-CM | POA: Diagnosis not present

## 2023-04-20 DIAGNOSIS — Z556 Problems related to health literacy: Secondary | ICD-10-CM | POA: Diagnosis not present

## 2023-04-20 DIAGNOSIS — N182 Chronic kidney disease, stage 2 (mild): Secondary | ICD-10-CM | POA: Diagnosis not present

## 2023-04-20 DIAGNOSIS — Z7984 Long term (current) use of oral hypoglycemic drugs: Secondary | ICD-10-CM | POA: Diagnosis not present

## 2023-04-20 DIAGNOSIS — Z87891 Personal history of nicotine dependence: Secondary | ICD-10-CM | POA: Diagnosis not present

## 2023-04-20 DIAGNOSIS — M1990 Unspecified osteoarthritis, unspecified site: Secondary | ICD-10-CM | POA: Diagnosis not present

## 2023-04-20 DIAGNOSIS — R1310 Dysphagia, unspecified: Secondary | ICD-10-CM | POA: Diagnosis not present

## 2023-04-20 DIAGNOSIS — H9190 Unspecified hearing loss, unspecified ear: Secondary | ICD-10-CM | POA: Diagnosis not present

## 2023-04-20 DIAGNOSIS — E1122 Type 2 diabetes mellitus with diabetic chronic kidney disease: Secondary | ICD-10-CM | POA: Diagnosis not present

## 2023-04-24 ENCOUNTER — Ambulatory Visit (INDEPENDENT_AMBULATORY_CARE_PROVIDER_SITE_OTHER): Payer: Medicare Other | Admitting: Urology

## 2023-04-24 ENCOUNTER — Encounter: Payer: Self-pay | Admitting: Urology

## 2023-04-24 VITALS — BP 116/68 | HR 52 | Temp 97.8°F

## 2023-04-24 DIAGNOSIS — F0390 Unspecified dementia without behavioral disturbance: Secondary | ICD-10-CM

## 2023-04-24 DIAGNOSIS — R339 Retention of urine, unspecified: Secondary | ICD-10-CM | POA: Diagnosis not present

## 2023-04-24 DIAGNOSIS — N319 Neuromuscular dysfunction of bladder, unspecified: Secondary | ICD-10-CM | POA: Diagnosis not present

## 2023-04-24 DIAGNOSIS — Z978 Presence of other specified devices: Secondary | ICD-10-CM

## 2023-04-24 MED ORDER — GEMTESA 75 MG PO TABS
1.0000 | ORAL_TABLET | Freq: Every day | ORAL | 11 refills | Status: DC
Start: 2023-04-24 — End: 2023-04-24

## 2023-04-24 MED ORDER — GEMTESA 75 MG PO TABS
1.0000 | ORAL_TABLET | Freq: Every day | ORAL | 11 refills | Status: AC
Start: 2023-04-24 — End: ?

## 2023-04-27 DIAGNOSIS — M2041 Other hammer toe(s) (acquired), right foot: Secondary | ICD-10-CM | POA: Diagnosis not present

## 2023-04-27 DIAGNOSIS — M2042 Other hammer toe(s) (acquired), left foot: Secondary | ICD-10-CM | POA: Diagnosis not present

## 2023-04-27 DIAGNOSIS — M79671 Pain in right foot: Secondary | ICD-10-CM | POA: Diagnosis not present

## 2023-04-27 DIAGNOSIS — M79672 Pain in left foot: Secondary | ICD-10-CM | POA: Diagnosis not present

## 2023-04-27 DIAGNOSIS — I8393 Asymptomatic varicose veins of bilateral lower extremities: Secondary | ICD-10-CM | POA: Diagnosis not present

## 2023-04-27 DIAGNOSIS — Q6672 Congenital pes cavus, left foot: Secondary | ICD-10-CM | POA: Diagnosis not present

## 2023-04-27 DIAGNOSIS — E1140 Type 2 diabetes mellitus with diabetic neuropathy, unspecified: Secondary | ICD-10-CM | POA: Diagnosis not present

## 2023-04-27 DIAGNOSIS — Q6671 Congenital pes cavus, right foot: Secondary | ICD-10-CM | POA: Diagnosis not present

## 2023-04-27 DIAGNOSIS — B351 Tinea unguium: Secondary | ICD-10-CM | POA: Diagnosis not present

## 2023-04-27 DIAGNOSIS — L600 Ingrowing nail: Secondary | ICD-10-CM | POA: Diagnosis not present

## 2023-04-27 DIAGNOSIS — M21629 Bunionette of unspecified foot: Secondary | ICD-10-CM | POA: Diagnosis not present

## 2023-04-27 DIAGNOSIS — E11621 Type 2 diabetes mellitus with foot ulcer: Secondary | ICD-10-CM | POA: Diagnosis not present

## 2023-04-28 DIAGNOSIS — N138 Other obstructive and reflux uropathy: Secondary | ICD-10-CM | POA: Diagnosis not present

## 2023-04-28 DIAGNOSIS — Z794 Long term (current) use of insulin: Secondary | ICD-10-CM | POA: Diagnosis not present

## 2023-04-28 DIAGNOSIS — Z7984 Long term (current) use of oral hypoglycemic drugs: Secondary | ICD-10-CM | POA: Diagnosis not present

## 2023-04-28 DIAGNOSIS — N182 Chronic kidney disease, stage 2 (mild): Secondary | ICD-10-CM | POA: Diagnosis not present

## 2023-04-28 DIAGNOSIS — I129 Hypertensive chronic kidney disease with stage 1 through stage 4 chronic kidney disease, or unspecified chronic kidney disease: Secondary | ICD-10-CM | POA: Diagnosis not present

## 2023-04-28 DIAGNOSIS — I2510 Atherosclerotic heart disease of native coronary artery without angina pectoris: Secondary | ICD-10-CM | POA: Diagnosis not present

## 2023-04-28 DIAGNOSIS — Z87440 Personal history of urinary (tract) infections: Secondary | ICD-10-CM | POA: Diagnosis not present

## 2023-04-28 DIAGNOSIS — Z8673 Personal history of transient ischemic attack (TIA), and cerebral infarction without residual deficits: Secondary | ICD-10-CM | POA: Diagnosis not present

## 2023-04-28 DIAGNOSIS — Z466 Encounter for fitting and adjustment of urinary device: Secondary | ICD-10-CM | POA: Diagnosis not present

## 2023-04-28 DIAGNOSIS — M1990 Unspecified osteoarthritis, unspecified site: Secondary | ICD-10-CM | POA: Diagnosis not present

## 2023-04-28 DIAGNOSIS — R338 Other retention of urine: Secondary | ICD-10-CM | POA: Diagnosis not present

## 2023-04-28 DIAGNOSIS — H9190 Unspecified hearing loss, unspecified ear: Secondary | ICD-10-CM | POA: Diagnosis not present

## 2023-04-28 DIAGNOSIS — Z87891 Personal history of nicotine dependence: Secondary | ICD-10-CM | POA: Diagnosis not present

## 2023-04-28 DIAGNOSIS — R1310 Dysphagia, unspecified: Secondary | ICD-10-CM | POA: Diagnosis not present

## 2023-04-28 DIAGNOSIS — E1140 Type 2 diabetes mellitus with diabetic neuropathy, unspecified: Secondary | ICD-10-CM | POA: Diagnosis not present

## 2023-04-28 DIAGNOSIS — E1122 Type 2 diabetes mellitus with diabetic chronic kidney disease: Secondary | ICD-10-CM | POA: Diagnosis not present

## 2023-04-28 DIAGNOSIS — G4730 Sleep apnea, unspecified: Secondary | ICD-10-CM | POA: Diagnosis not present

## 2023-04-28 DIAGNOSIS — Z556 Problems related to health literacy: Secondary | ICD-10-CM | POA: Diagnosis not present

## 2023-05-01 ENCOUNTER — Other Ambulatory Visit: Payer: Self-pay

## 2023-05-01 DIAGNOSIS — N319 Neuromuscular dysfunction of bladder, unspecified: Secondary | ICD-10-CM

## 2023-05-03 DIAGNOSIS — E038 Other specified hypothyroidism: Secondary | ICD-10-CM | POA: Diagnosis not present

## 2023-05-03 DIAGNOSIS — E7849 Other hyperlipidemia: Secondary | ICD-10-CM | POA: Diagnosis not present

## 2023-05-03 DIAGNOSIS — E119 Type 2 diabetes mellitus without complications: Secondary | ICD-10-CM | POA: Diagnosis not present

## 2023-05-03 DIAGNOSIS — D518 Other vitamin B12 deficiency anemias: Secondary | ICD-10-CM | POA: Diagnosis not present

## 2023-05-03 DIAGNOSIS — I10 Essential (primary) hypertension: Secondary | ICD-10-CM | POA: Diagnosis not present

## 2023-05-03 DIAGNOSIS — E559 Vitamin D deficiency, unspecified: Secondary | ICD-10-CM | POA: Diagnosis not present

## 2023-05-03 DIAGNOSIS — I2510 Atherosclerotic heart disease of native coronary artery without angina pectoris: Secondary | ICD-10-CM | POA: Diagnosis not present

## 2023-05-03 DIAGNOSIS — E1100 Type 2 diabetes mellitus with hyperosmolarity without nonketotic hyperglycemic-hyperosmolar coma (NKHHC): Secondary | ICD-10-CM | POA: Diagnosis not present

## 2023-05-03 DIAGNOSIS — E1165 Type 2 diabetes mellitus with hyperglycemia: Secondary | ICD-10-CM | POA: Diagnosis not present

## 2023-05-03 DIAGNOSIS — E785 Hyperlipidemia, unspecified: Secondary | ICD-10-CM | POA: Diagnosis not present

## 2023-05-04 DIAGNOSIS — Z7984 Long term (current) use of oral hypoglycemic drugs: Secondary | ICD-10-CM | POA: Diagnosis not present

## 2023-05-04 DIAGNOSIS — E1140 Type 2 diabetes mellitus with diabetic neuropathy, unspecified: Secondary | ICD-10-CM | POA: Diagnosis not present

## 2023-05-04 DIAGNOSIS — Z556 Problems related to health literacy: Secondary | ICD-10-CM | POA: Diagnosis not present

## 2023-05-04 DIAGNOSIS — G4730 Sleep apnea, unspecified: Secondary | ICD-10-CM | POA: Diagnosis not present

## 2023-05-04 DIAGNOSIS — Z87440 Personal history of urinary (tract) infections: Secondary | ICD-10-CM | POA: Diagnosis not present

## 2023-05-04 DIAGNOSIS — Z794 Long term (current) use of insulin: Secondary | ICD-10-CM | POA: Diagnosis not present

## 2023-05-04 DIAGNOSIS — Z466 Encounter for fitting and adjustment of urinary device: Secondary | ICD-10-CM | POA: Diagnosis not present

## 2023-05-04 DIAGNOSIS — R338 Other retention of urine: Secondary | ICD-10-CM | POA: Diagnosis not present

## 2023-05-04 DIAGNOSIS — Z87891 Personal history of nicotine dependence: Secondary | ICD-10-CM | POA: Diagnosis not present

## 2023-05-04 DIAGNOSIS — I129 Hypertensive chronic kidney disease with stage 1 through stage 4 chronic kidney disease, or unspecified chronic kidney disease: Secondary | ICD-10-CM | POA: Diagnosis not present

## 2023-05-04 DIAGNOSIS — N182 Chronic kidney disease, stage 2 (mild): Secondary | ICD-10-CM | POA: Diagnosis not present

## 2023-05-04 DIAGNOSIS — R7309 Other abnormal glucose: Secondary | ICD-10-CM | POA: Diagnosis not present

## 2023-05-04 DIAGNOSIS — N138 Other obstructive and reflux uropathy: Secondary | ICD-10-CM | POA: Diagnosis not present

## 2023-05-04 DIAGNOSIS — Z8673 Personal history of transient ischemic attack (TIA), and cerebral infarction without residual deficits: Secondary | ICD-10-CM | POA: Diagnosis not present

## 2023-05-04 DIAGNOSIS — H9190 Unspecified hearing loss, unspecified ear: Secondary | ICD-10-CM | POA: Diagnosis not present

## 2023-05-04 DIAGNOSIS — E1122 Type 2 diabetes mellitus with diabetic chronic kidney disease: Secondary | ICD-10-CM | POA: Diagnosis not present

## 2023-05-04 DIAGNOSIS — I2510 Atherosclerotic heart disease of native coronary artery without angina pectoris: Secondary | ICD-10-CM | POA: Diagnosis not present

## 2023-05-04 DIAGNOSIS — M1990 Unspecified osteoarthritis, unspecified site: Secondary | ICD-10-CM | POA: Diagnosis not present

## 2023-05-04 DIAGNOSIS — R1310 Dysphagia, unspecified: Secondary | ICD-10-CM | POA: Diagnosis not present

## 2023-05-09 DIAGNOSIS — N1831 Chronic kidney disease, stage 3a: Secondary | ICD-10-CM | POA: Diagnosis not present

## 2023-05-09 DIAGNOSIS — E785 Hyperlipidemia, unspecified: Secondary | ICD-10-CM | POA: Diagnosis not present

## 2023-05-09 DIAGNOSIS — I2510 Atherosclerotic heart disease of native coronary artery without angina pectoris: Secondary | ICD-10-CM | POA: Diagnosis not present

## 2023-05-09 DIAGNOSIS — E119 Type 2 diabetes mellitus without complications: Secondary | ICD-10-CM | POA: Diagnosis not present

## 2023-05-09 DIAGNOSIS — I10 Essential (primary) hypertension: Secondary | ICD-10-CM | POA: Diagnosis not present

## 2023-05-09 DIAGNOSIS — R339 Retention of urine, unspecified: Secondary | ICD-10-CM | POA: Diagnosis not present

## 2023-05-12 DIAGNOSIS — E1122 Type 2 diabetes mellitus with diabetic chronic kidney disease: Secondary | ICD-10-CM | POA: Diagnosis not present

## 2023-05-12 DIAGNOSIS — I129 Hypertensive chronic kidney disease with stage 1 through stage 4 chronic kidney disease, or unspecified chronic kidney disease: Secondary | ICD-10-CM | POA: Diagnosis not present

## 2023-05-12 DIAGNOSIS — Z794 Long term (current) use of insulin: Secondary | ICD-10-CM | POA: Diagnosis not present

## 2023-05-12 DIAGNOSIS — Z87440 Personal history of urinary (tract) infections: Secondary | ICD-10-CM | POA: Diagnosis not present

## 2023-05-12 DIAGNOSIS — I2510 Atherosclerotic heart disease of native coronary artery without angina pectoris: Secondary | ICD-10-CM | POA: Diagnosis not present

## 2023-05-12 DIAGNOSIS — M1990 Unspecified osteoarthritis, unspecified site: Secondary | ICD-10-CM | POA: Diagnosis not present

## 2023-05-12 DIAGNOSIS — R338 Other retention of urine: Secondary | ICD-10-CM | POA: Diagnosis not present

## 2023-05-12 DIAGNOSIS — H9190 Unspecified hearing loss, unspecified ear: Secondary | ICD-10-CM | POA: Diagnosis not present

## 2023-05-12 DIAGNOSIS — Z556 Problems related to health literacy: Secondary | ICD-10-CM | POA: Diagnosis not present

## 2023-05-12 DIAGNOSIS — N138 Other obstructive and reflux uropathy: Secondary | ICD-10-CM | POA: Diagnosis not present

## 2023-05-12 DIAGNOSIS — N182 Chronic kidney disease, stage 2 (mild): Secondary | ICD-10-CM | POA: Diagnosis not present

## 2023-05-12 DIAGNOSIS — G4730 Sleep apnea, unspecified: Secondary | ICD-10-CM | POA: Diagnosis not present

## 2023-05-12 DIAGNOSIS — Z8673 Personal history of transient ischemic attack (TIA), and cerebral infarction without residual deficits: Secondary | ICD-10-CM | POA: Diagnosis not present

## 2023-05-12 DIAGNOSIS — R1310 Dysphagia, unspecified: Secondary | ICD-10-CM | POA: Diagnosis not present

## 2023-05-12 DIAGNOSIS — Z7984 Long term (current) use of oral hypoglycemic drugs: Secondary | ICD-10-CM | POA: Diagnosis not present

## 2023-05-12 DIAGNOSIS — Z466 Encounter for fitting and adjustment of urinary device: Secondary | ICD-10-CM | POA: Diagnosis not present

## 2023-05-12 DIAGNOSIS — E1140 Type 2 diabetes mellitus with diabetic neuropathy, unspecified: Secondary | ICD-10-CM | POA: Diagnosis not present

## 2023-05-12 DIAGNOSIS — Z87891 Personal history of nicotine dependence: Secondary | ICD-10-CM | POA: Diagnosis not present

## 2023-05-19 ENCOUNTER — Other Ambulatory Visit: Payer: Self-pay | Admitting: Diagnostic Radiology

## 2023-05-19 DIAGNOSIS — N138 Other obstructive and reflux uropathy: Secondary | ICD-10-CM | POA: Diagnosis not present

## 2023-05-19 DIAGNOSIS — M1990 Unspecified osteoarthritis, unspecified site: Secondary | ICD-10-CM | POA: Diagnosis not present

## 2023-05-19 DIAGNOSIS — I2510 Atherosclerotic heart disease of native coronary artery without angina pectoris: Secondary | ICD-10-CM | POA: Diagnosis not present

## 2023-05-19 DIAGNOSIS — I10 Essential (primary) hypertension: Secondary | ICD-10-CM | POA: Diagnosis not present

## 2023-05-19 DIAGNOSIS — N182 Chronic kidney disease, stage 2 (mild): Secondary | ICD-10-CM | POA: Diagnosis not present

## 2023-05-19 DIAGNOSIS — E7849 Other hyperlipidemia: Secondary | ICD-10-CM | POA: Diagnosis not present

## 2023-05-19 DIAGNOSIS — R1310 Dysphagia, unspecified: Secondary | ICD-10-CM | POA: Diagnosis not present

## 2023-05-19 DIAGNOSIS — I129 Hypertensive chronic kidney disease with stage 1 through stage 4 chronic kidney disease, or unspecified chronic kidney disease: Secondary | ICD-10-CM | POA: Diagnosis not present

## 2023-05-19 DIAGNOSIS — E119 Type 2 diabetes mellitus without complications: Secondary | ICD-10-CM | POA: Diagnosis not present

## 2023-05-19 DIAGNOSIS — E1100 Type 2 diabetes mellitus with hyperosmolarity without nonketotic hyperglycemic-hyperosmolar coma (NKHHC): Secondary | ICD-10-CM | POA: Diagnosis not present

## 2023-05-19 DIAGNOSIS — E1165 Type 2 diabetes mellitus with hyperglycemia: Secondary | ICD-10-CM | POA: Diagnosis not present

## 2023-05-19 DIAGNOSIS — H9190 Unspecified hearing loss, unspecified ear: Secondary | ICD-10-CM | POA: Diagnosis not present

## 2023-05-19 DIAGNOSIS — E1140 Type 2 diabetes mellitus with diabetic neuropathy, unspecified: Secondary | ICD-10-CM | POA: Diagnosis not present

## 2023-05-19 DIAGNOSIS — E785 Hyperlipidemia, unspecified: Secondary | ICD-10-CM | POA: Diagnosis not present

## 2023-05-19 DIAGNOSIS — D518 Other vitamin B12 deficiency anemias: Secondary | ICD-10-CM | POA: Diagnosis not present

## 2023-05-19 DIAGNOSIS — Z556 Problems related to health literacy: Secondary | ICD-10-CM | POA: Diagnosis not present

## 2023-05-19 DIAGNOSIS — R338 Other retention of urine: Secondary | ICD-10-CM | POA: Diagnosis not present

## 2023-05-19 DIAGNOSIS — Z87891 Personal history of nicotine dependence: Secondary | ICD-10-CM | POA: Diagnosis not present

## 2023-05-19 DIAGNOSIS — Z794 Long term (current) use of insulin: Secondary | ICD-10-CM | POA: Diagnosis not present

## 2023-05-19 DIAGNOSIS — Z87440 Personal history of urinary (tract) infections: Secondary | ICD-10-CM | POA: Diagnosis not present

## 2023-05-19 DIAGNOSIS — Z8673 Personal history of transient ischemic attack (TIA), and cerebral infarction without residual deficits: Secondary | ICD-10-CM | POA: Diagnosis not present

## 2023-05-19 DIAGNOSIS — E1122 Type 2 diabetes mellitus with diabetic chronic kidney disease: Secondary | ICD-10-CM | POA: Diagnosis not present

## 2023-05-19 DIAGNOSIS — E559 Vitamin D deficiency, unspecified: Secondary | ICD-10-CM | POA: Diagnosis not present

## 2023-05-19 DIAGNOSIS — Z466 Encounter for fitting and adjustment of urinary device: Secondary | ICD-10-CM | POA: Diagnosis not present

## 2023-05-19 DIAGNOSIS — Z7984 Long term (current) use of oral hypoglycemic drugs: Secondary | ICD-10-CM | POA: Diagnosis not present

## 2023-05-19 DIAGNOSIS — E038 Other specified hypothyroidism: Secondary | ICD-10-CM | POA: Diagnosis not present

## 2023-05-19 DIAGNOSIS — G4730 Sleep apnea, unspecified: Secondary | ICD-10-CM | POA: Diagnosis not present

## 2023-05-21 NOTE — H&P (Incomplete)
 Chief Complaint:  Neurogenic hypotonic bladder with chronic urinary retention.  Referring Provider(s): Evette Georges  Supervising Physician: Richarda Overlie  Patient Status: [facility] - Out-pt  History of Present Illness: Albert Garrison is a 78 y.o. male with medical issues including dementia, BPH, CKD, GERD, HTN, and T2DM  He has  Neurogenic hypotonic bladder with chronic urinary retention. On 09/13/2021, his urodynamic testing showed a postvoid residual of 1300 mL, decreased bladder sensation with first sensation at 1000 mL, no instability, no leakage, no voluntary contraction and inability to void.  He had a foley in place but he has had issues with bleeding from the urethral meatus.  We are asked to place a suprapubic catheter.  He is NPO.   Patient is Full Code  Past Medical History:  Diagnosis Date   Arthritis    BPH (benign prostatic hyperplasia)    Cardiac arrest (HCC)    CKD (chronic kidney disease), stage II    Complicated UTI (urinary tract infection) 03/2014   Depression    Dysphagia    GERD (gastroesophageal reflux disease)    History of pneumonia    History of stroke    HOH (hard of hearing)    Hypertension    Hyponatremia    Lower GI bleed 2017   a. ? due to polyp.   NSVT (nonsustained ventricular tachycardia) (HCC)    Rheumatic fever    Childhood   Shock circulatory (HCC)    Sleep apnea    Does not use CPAP   Type 2 diabetes mellitus (HCC)     Past Surgical History:  Procedure Laterality Date   CARPAL TUNNEL RELEASE Left 2010   CARPAL TUNNEL RELEASE Right 07/13/2017   Procedure: CARPAL TUNNEL RELEASE;  Surgeon: Vickki Hearing, MD;  Location: [facility];  Service: Orthopedics;  Laterality: Right;   CERVICAL SPINE SURGERY  2010   COLONOSCOPY WITH PROPOFOL N/A 11/30/2015   Procedure: COLONOSCOPY WITH PROPOFOL;  Surgeon: Corbin Ade, MD;  Location: [facility];  Service: Endoscopy;  Laterality: N/A;   KNEE ARTHROSCOPY  1989   LUMBAR LAMINECTOMY   1986   Nose surgery - broken nose     POLYPECTOMY  11/30/2015   Procedure: POLYPECTOMY;  Surgeon: Corbin Ade, MD;  Location: [facility];  Service: Endoscopy;;  polyp at ascending colon, rectal polyp   TRANSURETHRAL INCISION OF PROSTATE N/A 01/20/2015   Procedure: TRANSURETHRAL INCISION OF THE PROSTATE (TUIP);  Surgeon: Bjorn Pippin, MD;  Location: [facility];  Service: Urology;  Laterality: N/A;    Allergies: Patient has no known allergies.  Medications: Prior to Admission medications   Medication Sig Start Date End Date Taking? Authorizing Provider  amLODipine (NORVASC) 10 MG tablet Take 1 tablet (10 mg total) by mouth daily. 01/11/21   Catarina Hartshorn, MD  aspirin 81 MG chewable tablet Chew 1 tablet (81 mg total) by mouth daily. 01/11/21   Catarina Hartshorn, MD  atorvastatin (LIPITOR) 40 MG tablet Take 1 tablet (40 mg total) by mouth daily. 01/11/21   Catarina Hartshorn, MD  finasteride (PROSCAR) 5 MG tablet Take 1 tablet (5 mg total) by mouth daily. 05/09/22   Jerilee Field, MD  haloperidol (HALDOL) 0.5 MG tablet Take 1 tablet (0.5 mg total) by mouth 2 (two) times daily. 01/11/21 02/10/21  Catarina Hartshorn, MD  HUMALOG KWIKPEN 100 UNIT/ML KwikPen Inject into the skin. 11/22/21   [provider]  magnesium oxide (MAG-OX) 400 (240 Mg) MG tablet Take 1 tablet (400  mg total) by mouth daily. 01/12/21   Catarina Hartshorn, MD  memantine (NAMENDA) 10 MG tablet Take 10 mg by mouth daily. 11/23/21   [provider]  metFORMIN (GLUCOPHAGE) 500 MG tablet Take 1 tablet (500 mg total) by mouth daily with breakfast. Patient taking differently: Take 500 mg by mouth daily with breakfast. Pt taking bid 01/11/21   Tat, Onalee Hua, MD  metoprolol tartrate (LOPRESSOR) 25 MG tablet Take 0.5 tablets (12.5 mg total) by mouth 2 (two) times daily. 01/11/21   Catarina Hartshorn, MD  mirtazapine (REMERON) 7.5 MG tablet Take 7.5 mg by mouth at bedtime. 07/06/21   [provider]  Multiple Vitamin (MULTIVITAMIN WITH MINERALS) TABS tablet  Take 1 tablet by mouth daily. 01/11/21   Catarina Hartshorn, MD  silodosin (RAPAFLO) 8 MG CAPS capsule Take 8 mg by mouth daily. 08/07/21   [provider]  Vibegron (GEMTESA) 75 MG TABS Take 1 tablet (75 mg total) by mouth daily. 04/24/23   Donnita Falls, FNP     Family History  Problem Relation Age of Onset   Heart disease Sister    Hypertension Sister     Social History   Socioeconomic History   Marital status: Divorced    Spouse name: Not on file   Number of children: Not on file   Years of education: Not on file   Highest education level: Not on file  Occupational History   Not on file  Tobacco Use   Smoking status: Former    Current packs/day: 0.00    Average packs/day: 1.5 packs/day for 14.0 years (21.0 ttl pk-yrs)    Types: Cigarettes    Start date: 08/05/1961    Quit date: 08/06/1975    Years since quitting: 47.8   Smokeless tobacco: Never  Vaping Use   Vaping status: Never Used  Substance and Sexual Activity   Alcohol use: Not Currently    Alcohol/week: 3.0 standard drinks of alcohol    Types: 3 Cans of beer per week    Comment: 3 12 oz cans of beer each week.None since 03/2014   Drug use: No   Sexual activity: Yes    Birth control/protection: None  Other Topics Concern   Not on file  Social History Narrative   Not on file   Social Drivers of Health   Financial Resource Strain: Not on file  Food Insecurity: Not on file  Transportation Needs: Not on file  Physical Activity: Not on file  Stress: Not on file  Social Connections: Not on file     Review of Systems  Unable to perform ROS: Dementia    Vital Signs: Wt Readings from Last 3 Encounters:  05/22/23 150 lb (68 kg)  03/28/23 171 lb 15.3 oz (78 kg)  03/23/23 171 lb 15.3 oz (78 kg)   Temp Readings from Last 3 Encounters:  05/22/23 98 F (36.7 C) (Oral)  04/24/23 97.8 F (36.6 C)  03/28/23 98 F (36.7 C) (Oral)   BP Readings from Last 3 Encounters:  05/22/23 (!) 92/54  04/24/23 116/68   03/28/23 116/83   Pulse Readings from Last 3 Encounters:  05/22/23 (!) 53  04/24/23 (!) 52  03/28/23 62     Advance Care Plan: The advanced care place/surrogate decision maker was discussed at the time of visit and the patient did not wish to discuss or was not able to name a surrogate decision maker or provide an advance care plan.  Physical Exam Vitals reviewed.  Constitutional:  Appearance: Normal appearance.  HENT:     Head: Normocephalic and atraumatic.  Eyes:     Extraocular Movements: Extraocular movements intact.  Cardiovascular:     Rate and Rhythm: Normal rate and regular rhythm.  Pulmonary:     Effort: Pulmonary effort is normal. No respiratory distress.     Breath sounds: Normal breath sounds.  Abdominal:     General: There is no distension.     Palpations: Abdomen is soft.     Tenderness: There is no abdominal tenderness.  Genitourinary:    Comments: Foley in Place Musculoskeletal:        General: Normal range of motion.     Cervical back: Normal range of motion.  Skin:    General: Skin is warm and dry.  Neurological:     General: No focal deficit present.     Mental Status: He is alert.     Labs:  CBC: Recent Labs    09/23/22 1908 12/01/22 1158 03/23/23 0427 03/28/23 1139  WBC 7.8 7.6 8.1 7.0  HGB 13.2 13.0 13.2 11.9*  HCT 37.7* 38.8* 39.2 35.3*  PLT 212 297 224 227    COAGS: Recent Labs    03/23/23 0427  INR 1.1    BMP: Recent Labs    09/23/22 1908 12/01/22 1158 03/23/23 0427 03/28/23 1139  NA 131* 134* 137 134*  K 3.7 4.0 3.9 4.4  CL 100 102 104 104  CO2 23 22 22 25   GLUCOSE 125* 99 88 113*  BUN 16 17 16 15   CALCIUM 8.8* 8.7* 9.1 8.9  CREATININE 1.13 1.13 1.08 1.21  GFRNONAA >60 >60 >60 >60    LIVER FUNCTION TESTS: Recent Labs    03/23/23 0427  BILITOT 0.8  AST 20  ALT 17  ALKPHOS 66  PROT 6.3*  ALBUMIN 3.4*    TUMOR MARKERS: No results for input(s): "AFPTM", "CEA", "CA199", "CHROMGRNA" in the last  8760 hours.  Assessment and Plan:  Neurogenic hypotonic bladder with chronic urinary retention.  Foley in place - Bleeding from urethral meatus.  Unfortunately, aspirin was not held. Per SIRS anticoagulation guidelines, aspirin must be held x 5 days prior to [facility] cath placement.  *I have written an order on the SNF order sheet to discontinue aspirin 5 days prior to procedure.  IR Scheduler will call facility with new date/time.  Thank you for allowing our service to participate in Albert Garrison 's care.  Electronically Signed: Gwynneth Macleod, PA-C   05/22/2023, 9:31 AM      I spent a total of  30 Minutes   in face to face in clinical consultation, greater than 50% of which was counseling/coordinating care for SP tube placement.

## 2023-05-22 ENCOUNTER — Encounter (HOSPITAL_COMMUNITY): Payer: Self-pay

## 2023-05-22 ENCOUNTER — Ambulatory Visit (HOSPITAL_COMMUNITY)
Admission: RE | Admit: 2023-05-22 | Discharge: 2023-05-22 | Disposition: A | Discharge status: Home / Self Care | Payer: Medicare Other | Source: Ambulatory Visit | Attending: Urology | Admitting: Urology

## 2023-05-22 DIAGNOSIS — N319 Neuromuscular dysfunction of bladder, unspecified: Secondary | ICD-10-CM

## 2023-05-22 LAB — GLUCOSE, CAPILLARY: Glucose-Capillary: 137 mg/dL — ABNORMAL HIGH (ref 70–99)

## 2023-05-22 MED ORDER — SODIUM CHLORIDE 0.9 % IV SOLN: 2.0000 g | INTRAVENOUS | Status: DC

## 2023-05-22 MED ORDER — CHLORHEXIDINE GLUCONATE CLOTH 2 % EX PADS: 6.0000 | MEDICATED_PAD | Freq: Every day | CUTANEOUS | Status: DC

## 2023-05-22 NOTE — Progress Notes (Signed)
 Foley clamp

## 2023-05-22 NOTE — Progress Notes (Signed)
 Foley unclamped. Procedure canceled.

## 2023-05-25 DIAGNOSIS — Z466 Encounter for fitting and adjustment of urinary device: Secondary | ICD-10-CM | POA: Diagnosis not present

## 2023-05-25 DIAGNOSIS — R531 Weakness: Secondary | ICD-10-CM | POA: Diagnosis not present

## 2023-05-25 DIAGNOSIS — Z743 Need for continuous supervision: Secondary | ICD-10-CM | POA: Diagnosis not present

## 2023-05-25 DIAGNOSIS — R5381 Other malaise: Secondary | ICD-10-CM | POA: Diagnosis not present

## 2023-05-25 DIAGNOSIS — T83098A Other mechanical complication of other indwelling urethral catheter, initial encounter: Secondary | ICD-10-CM | POA: Diagnosis not present

## 2023-05-26 DIAGNOSIS — E1140 Type 2 diabetes mellitus with diabetic neuropathy, unspecified: Secondary | ICD-10-CM | POA: Diagnosis not present

## 2023-05-26 DIAGNOSIS — Z8673 Personal history of transient ischemic attack (TIA), and cerebral infarction without residual deficits: Secondary | ICD-10-CM | POA: Diagnosis not present

## 2023-05-26 DIAGNOSIS — E1122 Type 2 diabetes mellitus with diabetic chronic kidney disease: Secondary | ICD-10-CM | POA: Diagnosis not present

## 2023-05-26 DIAGNOSIS — H9190 Unspecified hearing loss, unspecified ear: Secondary | ICD-10-CM | POA: Diagnosis not present

## 2023-05-26 DIAGNOSIS — Z794 Long term (current) use of insulin: Secondary | ICD-10-CM | POA: Diagnosis not present

## 2023-05-26 DIAGNOSIS — G4730 Sleep apnea, unspecified: Secondary | ICD-10-CM | POA: Diagnosis not present

## 2023-05-26 DIAGNOSIS — Z87891 Personal history of nicotine dependence: Secondary | ICD-10-CM | POA: Diagnosis not present

## 2023-05-26 DIAGNOSIS — N138 Other obstructive and reflux uropathy: Secondary | ICD-10-CM | POA: Diagnosis not present

## 2023-05-26 DIAGNOSIS — N182 Chronic kidney disease, stage 2 (mild): Secondary | ICD-10-CM | POA: Diagnosis not present

## 2023-05-26 DIAGNOSIS — Z556 Problems related to health literacy: Secondary | ICD-10-CM | POA: Diagnosis not present

## 2023-05-26 DIAGNOSIS — R1310 Dysphagia, unspecified: Secondary | ICD-10-CM | POA: Diagnosis not present

## 2023-05-26 DIAGNOSIS — I2510 Atherosclerotic heart disease of native coronary artery without angina pectoris: Secondary | ICD-10-CM | POA: Diagnosis not present

## 2023-05-26 DIAGNOSIS — Z7984 Long term (current) use of oral hypoglycemic drugs: Secondary | ICD-10-CM | POA: Diagnosis not present

## 2023-05-26 DIAGNOSIS — R338 Other retention of urine: Secondary | ICD-10-CM | POA: Diagnosis not present

## 2023-05-26 DIAGNOSIS — I129 Hypertensive chronic kidney disease with stage 1 through stage 4 chronic kidney disease, or unspecified chronic kidney disease: Secondary | ICD-10-CM | POA: Diagnosis not present

## 2023-05-26 DIAGNOSIS — Z87440 Personal history of urinary (tract) infections: Secondary | ICD-10-CM | POA: Diagnosis not present

## 2023-05-26 DIAGNOSIS — Z466 Encounter for fitting and adjustment of urinary device: Secondary | ICD-10-CM | POA: Diagnosis not present

## 2023-05-26 DIAGNOSIS — M1990 Unspecified osteoarthritis, unspecified site: Secondary | ICD-10-CM | POA: Diagnosis not present

## 2023-06-01 DIAGNOSIS — T83021A Displacement of indwelling urethral catheter, initial encounter: Secondary | ICD-10-CM | POA: Diagnosis not present

## 2023-06-02 DIAGNOSIS — R7309 Other abnormal glucose: Secondary | ICD-10-CM | POA: Diagnosis not present

## 2023-06-07 DIAGNOSIS — Z794 Long term (current) use of insulin: Secondary | ICD-10-CM | POA: Diagnosis not present

## 2023-06-07 DIAGNOSIS — M1990 Unspecified osteoarthritis, unspecified site: Secondary | ICD-10-CM | POA: Diagnosis not present

## 2023-06-07 DIAGNOSIS — R338 Other retention of urine: Secondary | ICD-10-CM | POA: Diagnosis not present

## 2023-06-07 DIAGNOSIS — I129 Hypertensive chronic kidney disease with stage 1 through stage 4 chronic kidney disease, or unspecified chronic kidney disease: Secondary | ICD-10-CM | POA: Diagnosis not present

## 2023-06-07 DIAGNOSIS — G4730 Sleep apnea, unspecified: Secondary | ICD-10-CM | POA: Diagnosis not present

## 2023-06-07 DIAGNOSIS — Z7984 Long term (current) use of oral hypoglycemic drugs: Secondary | ICD-10-CM | POA: Diagnosis not present

## 2023-06-07 DIAGNOSIS — E1122 Type 2 diabetes mellitus with diabetic chronic kidney disease: Secondary | ICD-10-CM | POA: Diagnosis not present

## 2023-06-07 DIAGNOSIS — E1140 Type 2 diabetes mellitus with diabetic neuropathy, unspecified: Secondary | ICD-10-CM | POA: Diagnosis not present

## 2023-06-07 DIAGNOSIS — N182 Chronic kidney disease, stage 2 (mild): Secondary | ICD-10-CM | POA: Diagnosis not present

## 2023-06-07 DIAGNOSIS — Z8673 Personal history of transient ischemic attack (TIA), and cerebral infarction without residual deficits: Secondary | ICD-10-CM | POA: Diagnosis not present

## 2023-06-07 DIAGNOSIS — N138 Other obstructive and reflux uropathy: Secondary | ICD-10-CM | POA: Diagnosis not present

## 2023-06-07 DIAGNOSIS — Z466 Encounter for fitting and adjustment of urinary device: Secondary | ICD-10-CM | POA: Diagnosis not present

## 2023-06-07 DIAGNOSIS — Z87440 Personal history of urinary (tract) infections: Secondary | ICD-10-CM | POA: Diagnosis not present

## 2023-06-07 DIAGNOSIS — Z556 Problems related to health literacy: Secondary | ICD-10-CM | POA: Diagnosis not present

## 2023-06-07 DIAGNOSIS — I2510 Atherosclerotic heart disease of native coronary artery without angina pectoris: Secondary | ICD-10-CM | POA: Diagnosis not present

## 2023-06-07 DIAGNOSIS — H9190 Unspecified hearing loss, unspecified ear: Secondary | ICD-10-CM | POA: Diagnosis not present

## 2023-06-07 DIAGNOSIS — Z87891 Personal history of nicotine dependence: Secondary | ICD-10-CM | POA: Diagnosis not present

## 2023-06-07 DIAGNOSIS — R1310 Dysphagia, unspecified: Secondary | ICD-10-CM | POA: Diagnosis not present

## 2023-06-13 ENCOUNTER — Other Ambulatory Visit: Payer: Self-pay | Admitting: Interventional Radiology

## 2023-06-13 DIAGNOSIS — Z01818 Encounter for other preprocedural examination: Secondary | ICD-10-CM

## 2023-06-14 ENCOUNTER — Encounter (HOSPITAL_COMMUNITY): Payer: Self-pay

## 2023-06-14 ENCOUNTER — Ambulatory Visit (HOSPITAL_COMMUNITY): Admission: RE | Admit: 2023-06-14 | Payer: Medicare Other | Source: Ambulatory Visit

## 2023-06-14 DIAGNOSIS — R296 Repeated falls: Secondary | ICD-10-CM | POA: Diagnosis not present

## 2023-06-14 DIAGNOSIS — S51011A Laceration without foreign body of right elbow, initial encounter: Secondary | ICD-10-CM | POA: Diagnosis not present

## 2023-06-15 DIAGNOSIS — I129 Hypertensive chronic kidney disease with stage 1 through stage 4 chronic kidney disease, or unspecified chronic kidney disease: Secondary | ICD-10-CM | POA: Diagnosis not present

## 2023-06-15 DIAGNOSIS — Z466 Encounter for fitting and adjustment of urinary device: Secondary | ICD-10-CM | POA: Diagnosis not present

## 2023-06-15 DIAGNOSIS — E1122 Type 2 diabetes mellitus with diabetic chronic kidney disease: Secondary | ICD-10-CM | POA: Diagnosis not present

## 2023-06-15 DIAGNOSIS — Z556 Problems related to health literacy: Secondary | ICD-10-CM | POA: Diagnosis not present

## 2023-06-15 DIAGNOSIS — M1990 Unspecified osteoarthritis, unspecified site: Secondary | ICD-10-CM | POA: Diagnosis not present

## 2023-06-15 DIAGNOSIS — Z8673 Personal history of transient ischemic attack (TIA), and cerebral infarction without residual deficits: Secondary | ICD-10-CM | POA: Diagnosis not present

## 2023-06-15 DIAGNOSIS — H9190 Unspecified hearing loss, unspecified ear: Secondary | ICD-10-CM | POA: Diagnosis not present

## 2023-06-15 DIAGNOSIS — Z794 Long term (current) use of insulin: Secondary | ICD-10-CM | POA: Diagnosis not present

## 2023-06-15 DIAGNOSIS — Z87891 Personal history of nicotine dependence: Secondary | ICD-10-CM | POA: Diagnosis not present

## 2023-06-15 DIAGNOSIS — R1310 Dysphagia, unspecified: Secondary | ICD-10-CM | POA: Diagnosis not present

## 2023-06-15 DIAGNOSIS — G4730 Sleep apnea, unspecified: Secondary | ICD-10-CM | POA: Diagnosis not present

## 2023-06-15 DIAGNOSIS — Z7982 Long term (current) use of aspirin: Secondary | ICD-10-CM | POA: Diagnosis not present

## 2023-06-15 DIAGNOSIS — I2510 Atherosclerotic heart disease of native coronary artery without angina pectoris: Secondary | ICD-10-CM | POA: Diagnosis not present

## 2023-06-15 DIAGNOSIS — Z7984 Long term (current) use of oral hypoglycemic drugs: Secondary | ICD-10-CM | POA: Diagnosis not present

## 2023-06-15 DIAGNOSIS — R338 Other retention of urine: Secondary | ICD-10-CM | POA: Diagnosis not present

## 2023-06-15 DIAGNOSIS — E1140 Type 2 diabetes mellitus with diabetic neuropathy, unspecified: Secondary | ICD-10-CM | POA: Diagnosis not present

## 2023-06-15 DIAGNOSIS — N138 Other obstructive and reflux uropathy: Secondary | ICD-10-CM | POA: Diagnosis not present

## 2023-06-15 DIAGNOSIS — Z87440 Personal history of urinary (tract) infections: Secondary | ICD-10-CM | POA: Diagnosis not present

## 2023-06-15 DIAGNOSIS — N182 Chronic kidney disease, stage 2 (mild): Secondary | ICD-10-CM | POA: Diagnosis not present

## 2023-06-16 DIAGNOSIS — D518 Other vitamin B12 deficiency anemias: Secondary | ICD-10-CM | POA: Diagnosis not present

## 2023-06-16 DIAGNOSIS — E785 Hyperlipidemia, unspecified: Secondary | ICD-10-CM | POA: Diagnosis not present

## 2023-06-16 DIAGNOSIS — E1165 Type 2 diabetes mellitus with hyperglycemia: Secondary | ICD-10-CM | POA: Diagnosis not present

## 2023-06-16 DIAGNOSIS — E1100 Type 2 diabetes mellitus with hyperosmolarity without nonketotic hyperglycemic-hyperosmolar coma (NKHHC): Secondary | ICD-10-CM | POA: Diagnosis not present

## 2023-06-16 DIAGNOSIS — E7849 Other hyperlipidemia: Secondary | ICD-10-CM | POA: Diagnosis not present

## 2023-06-16 DIAGNOSIS — E559 Vitamin D deficiency, unspecified: Secondary | ICD-10-CM | POA: Diagnosis not present

## 2023-06-16 DIAGNOSIS — E038 Other specified hypothyroidism: Secondary | ICD-10-CM | POA: Diagnosis not present

## 2023-06-16 DIAGNOSIS — E119 Type 2 diabetes mellitus without complications: Secondary | ICD-10-CM | POA: Diagnosis not present

## 2023-06-16 DIAGNOSIS — I2510 Atherosclerotic heart disease of native coronary artery without angina pectoris: Secondary | ICD-10-CM | POA: Diagnosis not present

## 2023-06-16 DIAGNOSIS — I10 Essential (primary) hypertension: Secondary | ICD-10-CM | POA: Diagnosis not present

## 2023-06-22 DIAGNOSIS — Z7982 Long term (current) use of aspirin: Secondary | ICD-10-CM | POA: Diagnosis not present

## 2023-06-22 DIAGNOSIS — Z87440 Personal history of urinary (tract) infections: Secondary | ICD-10-CM | POA: Diagnosis not present

## 2023-06-22 DIAGNOSIS — Z466 Encounter for fitting and adjustment of urinary device: Secondary | ICD-10-CM | POA: Diagnosis not present

## 2023-06-22 DIAGNOSIS — H9190 Unspecified hearing loss, unspecified ear: Secondary | ICD-10-CM | POA: Diagnosis not present

## 2023-06-22 DIAGNOSIS — Z794 Long term (current) use of insulin: Secondary | ICD-10-CM | POA: Diagnosis not present

## 2023-06-22 DIAGNOSIS — N182 Chronic kidney disease, stage 2 (mild): Secondary | ICD-10-CM | POA: Diagnosis not present

## 2023-06-22 DIAGNOSIS — Z556 Problems related to health literacy: Secondary | ICD-10-CM | POA: Diagnosis not present

## 2023-06-22 DIAGNOSIS — R1310 Dysphagia, unspecified: Secondary | ICD-10-CM | POA: Diagnosis not present

## 2023-06-22 DIAGNOSIS — Z87891 Personal history of nicotine dependence: Secondary | ICD-10-CM | POA: Diagnosis not present

## 2023-06-22 DIAGNOSIS — E1122 Type 2 diabetes mellitus with diabetic chronic kidney disease: Secondary | ICD-10-CM | POA: Diagnosis not present

## 2023-06-22 DIAGNOSIS — G4730 Sleep apnea, unspecified: Secondary | ICD-10-CM | POA: Diagnosis not present

## 2023-06-22 DIAGNOSIS — M1990 Unspecified osteoarthritis, unspecified site: Secondary | ICD-10-CM | POA: Diagnosis not present

## 2023-06-22 DIAGNOSIS — E1140 Type 2 diabetes mellitus with diabetic neuropathy, unspecified: Secondary | ICD-10-CM | POA: Diagnosis not present

## 2023-06-22 DIAGNOSIS — I129 Hypertensive chronic kidney disease with stage 1 through stage 4 chronic kidney disease, or unspecified chronic kidney disease: Secondary | ICD-10-CM | POA: Diagnosis not present

## 2023-06-22 DIAGNOSIS — Z7984 Long term (current) use of oral hypoglycemic drugs: Secondary | ICD-10-CM | POA: Diagnosis not present

## 2023-06-22 DIAGNOSIS — Z8673 Personal history of transient ischemic attack (TIA), and cerebral infarction without residual deficits: Secondary | ICD-10-CM | POA: Diagnosis not present

## 2023-06-22 DIAGNOSIS — R338 Other retention of urine: Secondary | ICD-10-CM | POA: Diagnosis not present

## 2023-06-22 DIAGNOSIS — N138 Other obstructive and reflux uropathy: Secondary | ICD-10-CM | POA: Diagnosis not present

## 2023-06-22 DIAGNOSIS — I2510 Atherosclerotic heart disease of native coronary artery without angina pectoris: Secondary | ICD-10-CM | POA: Diagnosis not present

## 2023-06-26 DIAGNOSIS — D519 Vitamin B12 deficiency anemia, unspecified: Secondary | ICD-10-CM | POA: Diagnosis not present

## 2023-06-26 DIAGNOSIS — E782 Mixed hyperlipidemia: Secondary | ICD-10-CM | POA: Diagnosis not present

## 2023-06-26 DIAGNOSIS — E038 Other specified hypothyroidism: Secondary | ICD-10-CM | POA: Diagnosis not present

## 2023-06-26 DIAGNOSIS — Z79899 Other long term (current) drug therapy: Secondary | ICD-10-CM | POA: Diagnosis not present

## 2023-06-26 DIAGNOSIS — E119 Type 2 diabetes mellitus without complications: Secondary | ICD-10-CM | POA: Diagnosis not present

## 2023-06-27 ENCOUNTER — Other Ambulatory Visit: Payer: Self-pay | Admitting: Radiology

## 2023-06-28 ENCOUNTER — Ambulatory Visit (HOSPITAL_COMMUNITY)
Admission: RE | Admit: 2023-06-28 | Discharge: 2023-06-28 | Disposition: A | Discharge status: Home / Self Care | Source: Ambulatory Visit | Attending: Urology | Admitting: Urology

## 2023-06-28 ENCOUNTER — Encounter (HOSPITAL_COMMUNITY): Payer: Self-pay

## 2023-06-28 ENCOUNTER — Other Ambulatory Visit (HOSPITAL_COMMUNITY): Payer: Self-pay | Admitting: Urology

## 2023-06-28 DIAGNOSIS — N319 Neuromuscular dysfunction of bladder, unspecified: Secondary | ICD-10-CM | POA: Insufficient documentation

## 2023-06-28 DIAGNOSIS — R339 Retention of urine, unspecified: Secondary | ICD-10-CM

## 2023-06-28 DIAGNOSIS — N400 Enlarged prostate without lower urinary tract symptoms: Secondary | ICD-10-CM | POA: Insufficient documentation

## 2023-06-28 DIAGNOSIS — Z87891 Personal history of nicotine dependence: Secondary | ICD-10-CM | POA: Diagnosis not present

## 2023-06-28 DIAGNOSIS — F0393 Unspecified dementia, unspecified severity, with mood disturbance: Secondary | ICD-10-CM | POA: Diagnosis not present

## 2023-06-28 DIAGNOSIS — F32A Depression, unspecified: Secondary | ICD-10-CM | POA: Insufficient documentation

## 2023-06-28 DIAGNOSIS — Z01818 Encounter for other preprocedural examination: Secondary | ICD-10-CM

## 2023-06-28 HISTORY — PX: IR CYSTOSTOMY TUBE PLACEMENT/BLADDER ASPIRATION: IMG1097

## 2023-06-28 LAB — CBC
HCT: 40.4 % (ref 39.0–52.0)
Hemoglobin: 13.8 g/dL (ref 13.0–17.0)
MCH: 30.5 pg (ref 26.0–34.0)
MCHC: 34.2 g/dL (ref 30.0–36.0)
MCV: 89.4 fL (ref 80.0–100.0)
Platelets: 355 10*3/uL (ref 150–400)
RBC: 4.52 MIL/uL (ref 4.22–5.81)
RDW: 12.4 % (ref 11.5–15.5)
WBC: 9.5 10*3/uL (ref 4.0–10.5)
nRBC: 0 % (ref 0.0–0.2)

## 2023-06-28 LAB — GLUCOSE, CAPILLARY
Glucose-Capillary: 165 mg/dL — ABNORMAL HIGH (ref 70–99)
Glucose-Capillary: 123 mg/dL — ABNORMAL HIGH (ref 70–99)

## 2023-06-28 LAB — PROTIME-INR
INR: 1 (ref 0.8–1.2)
Prothrombin Time: 13.6 s (ref 11.4–15.2)

## 2023-06-28 MED ORDER — LIDOCAINE HCL (PF) 1 % IJ SOLN
10.0000 mL | Freq: Once | INTRAMUSCULAR | Status: AC
  Administered 2023-06-28: 10 mL

## 2023-06-28 MED ORDER — MIDAZOLAM HCL 2 MG/2ML IJ SOLN
INTRAMUSCULAR | Status: AC
  Filled 2023-06-28: qty 4

## 2023-06-28 MED ORDER — IOHEXOL 300 MG/ML  SOLN: 50.0000 mL | Freq: Once | INTRAMUSCULAR | Status: DC | PRN

## 2023-06-28 MED ORDER — MIDAZOLAM HCL 2 MG/2ML IJ SOLN
INTRAMUSCULAR | Status: AC | PRN
  Administered 2023-06-28 (×2): .5 mg via INTRAVENOUS

## 2023-06-28 MED ORDER — FENTANYL CITRATE (PF) 100 MCG/2ML IJ SOLN
INTRAMUSCULAR | Status: AC
  Filled 2023-06-28: qty 4

## 2023-06-28 MED ORDER — FENTANYL CITRATE (PF) 100 MCG/2ML IJ SOLN
INTRAMUSCULAR | Status: AC | PRN
  Administered 2023-06-28 (×2): 25 ug via INTRAVENOUS

## 2023-06-28 NOTE — Procedures (Signed)
 Interventional Radiology Procedure Note  Procedure: [place] AND FLUORO 16FR SP TUBE    Complications: None  Estimated Blood Loss:  MIN  Findings: COUNCIL TIP 16 FR SP TUBE    M. Ruel Favors, MD

## 2023-06-28 NOTE — H&P (Signed)
 Chief Complaint: Patient was seen in consultation today for neurogenic bladder- for supra pubic catheter placement at the request of Larocco,Sarah C  Referring Physician(s): Donnita Falls  Supervising Physician: Ruel Favors  Patient Status: [facility] - Out-pt  History of Present Illness: Albert Garrison is a 78 y.o. male   FULL Code status per chart Neurogenic bladder- retention Dementia; BPH; HTN Chronic indwelling foley catheter in place--- bleeding troubles from meatus Was scheduled for supra pubic catheter placement 05/22/23- but had not held ASA Now facility has confirmed last dose of ASA 06/23/23  Scheduled today for supra pubic catheter placement   Past Medical History:  Diagnosis Date   Arthritis    BPH (benign prostatic hyperplasia)    Cardiac arrest (HCC)    CKD (chronic kidney disease), stage II    Complicated UTI (urinary tract infection) 03/2014   Depression    Dysphagia    GERD (gastroesophageal reflux disease)    History of pneumonia    History of stroke    HOH (hard of hearing)    Hypertension    Hyponatremia    Lower GI bleed 2017   a. ? due to polyp.   NSVT (nonsustained ventricular tachycardia) (HCC)    Rheumatic fever    Childhood   Shock circulatory (HCC)    Sleep apnea    Does not use CPAP   Type 2 diabetes mellitus (HCC)     Past Surgical History:  Procedure Laterality Date   CARPAL TUNNEL RELEASE Left 2010   CARPAL TUNNEL RELEASE Right 07/13/2017   Procedure: CARPAL TUNNEL RELEASE;  Surgeon: Vickki Hearing, MD;  Location: [facility];  Service: Orthopedics;  Laterality: Right;   CERVICAL SPINE SURGERY  2010   COLONOSCOPY WITH PROPOFOL N/A 11/30/2015   Procedure: COLONOSCOPY WITH PROPOFOL;  Surgeon: Corbin Ade, MD;  [facility];  Service: Endoscopy;  Laterality: N/A;   KNEE ARTHROSCOPY  1989   LUMBAR LAMINECTOMY  1986   Nose surgery - broken nose     POLYPECTOMY  11/30/2015   Procedure: POLYPECTOMY;  Surgeon: Corbin Ade, MD;  Location: [facility];  Service: Endoscopy;;  polyp at ascending colon, rectal polyp   TRANSURETHRAL INCISION OF PROSTATE N/A 01/20/2015   Procedure: TRANSURETHRAL INCISION OF THE PROSTATE (TUIP);  Surgeon: Bjorn Pippin, MD;  Location: [facility];  Service: Urology;  Laterality: N/A;    Allergies: Patient has no known allergies.  Medications: Prior to Admission medications   Medication Sig Start Date End Date Taking? Authorizing Provider  amLODipine (NORVASC) 10 MG tablet Take 1 tablet (10 mg total) by mouth daily. 01/11/21   Catarina Hartshorn, MD  aspirin 81 MG chewable tablet Chew 1 tablet (81 mg total) by mouth daily. 01/11/21   Catarina Hartshorn, MD  atorvastatin (LIPITOR) 40 MG tablet Take 1 tablet (40 mg total) by mouth daily. 01/11/21   Catarina Hartshorn, MD  finasteride (PROSCAR) 5 MG tablet Take 1 tablet (5 mg total) by mouth daily. 05/09/22   Jerilee Field, MD  haloperidol (HALDOL) 0.5 MG tablet Take 1 tablet (0.5 mg total) by mouth 2 (two) times daily. 01/11/21 05/22/23  Catarina Hartshorn, MD  HUMALOG KWIKPEN 100 UNIT/ML KwikPen Inject into the skin. 11/22/21   [provider]  magnesium oxide (MAG-OX) 400 (240 Mg) MG tablet Take 1 tablet (400 mg total) by mouth daily. 01/12/21   Catarina Hartshorn, MD  memantine (NAMENDA) 10 MG tablet Take 10 mg by mouth daily. 11/23/21   [provider]  metFORMIN (GLUCOPHAGE) 500 MG tablet Take 1 tablet (500 mg total) by mouth daily with breakfast. Patient taking differently: Take 500 mg by mouth daily with breakfast. Pt taking bid 01/11/21   Tat, Onalee Hua, MD  metoprolol tartrate (LOPRESSOR) 25 MG tablet Take 0.5 tablets (12.5 mg total) by mouth 2 (two) times daily. 01/11/21   Catarina Hartshorn, MD  mirtazapine (REMERON) 7.5 MG tablet Take 7.5 mg by mouth at bedtime. 07/06/21   [provider]  Multiple Vitamin (MULTIVITAMIN WITH MINERALS) TABS tablet Take 1 tablet by mouth daily. 01/11/21   Catarina Hartshorn, MD  silodosin (RAPAFLO) 8 MG CAPS capsule Take 8 mg by  mouth daily. 08/07/21   [provider]  Vibegron (GEMTESA) 75 MG TABS Take 1 tablet (75 mg total) by mouth daily. 04/24/23   Donnita Falls, FNP     Family History  Problem Relation Age of Onset   Heart disease Sister    Hypertension Sister     Social History   Socioeconomic History   Marital status: Divorced    Spouse name: Not on file   Number of children: Not on file   Years of education: Not on file   Highest education level: Not on file  Occupational History   Not on file  Tobacco Use   Smoking status: Former    Current packs/day: 0.00    Average packs/day: 1.5 packs/day for 14.0 years (21.0 ttl pk-yrs)    Types: Cigarettes    Start date: 08/05/1961    Quit date: 08/06/1975    Years since quitting: 47.9   Smokeless tobacco: Never  Vaping Use   Vaping status: Never Used  Substance and Sexual Activity   Alcohol use: Not Currently    Alcohol/week: 3.0 standard drinks of alcohol    Types: 3 Cans of beer per week    Comment: 3 12 oz cans of beer each week.None since 03/2014   Drug use: No   Sexual activity: Yes    Birth control/protection: None  Other Topics Concern   Not on file  Social History Narrative   Not on file   Social Drivers of Health   Financial Resource Strain: Not on file  Food Insecurity: Not on file  Transportation Needs: Not on file  Physical Activity: Not on file  Stress: Not on file  Social Connections: Not on file    Review of Systems: A 12 point ROS discussed and pertinent positives are indicated in the HPI above.  All other systems are negative.  Review of Systems  Constitutional:  Negative for activity change, fatigue and fever.  Respiratory:  Negative for cough and shortness of breath.   Gastrointestinal:  Negative for abdominal pain.  Neurological:  Positive for weakness.  Psychiatric/Behavioral:  Positive for confusion and decreased concentration. Negative for behavioral problems.     Vital Signs: BP 134/79   Pulse 61    Temp 97.9 F (36.6 C) (Oral)   Resp 18   Ht 6' (1.829 m)   Wt 175 lb (79.4 kg)   SpO2 97%   BMI 23.73 kg/m   Advance Care Plan: The advanced care plan/surrogate decision maker was discussed at the time of visit and documented in the medical record.    Physical Exam Vitals reviewed.  HENT:     Mouth/Throat:     Mouth: Mucous membranes are moist.  Cardiovascular:     Rate and Rhythm: Normal rate and regular rhythm.     Heart sounds: Normal  heart sounds.  Pulmonary:     Effort: Pulmonary effort is normal.     Breath sounds: Normal breath sounds. No wheezing.  Abdominal:     Palpations: Abdomen is soft.  Musculoskeletal:        General: Normal range of motion.  Skin:    General: Skin is warm.  Neurological:     Mental Status: He is alert. Mental status is at baseline.     Comments: Able to state correct name and DOB Unable to answer any health questions  Psychiatric:     Comments: Discussed procedure with pts brother Albert Garrison POA And Son Albert Garrison ---   All via phone They consent to procedure     Imaging: No results found.  Labs:  CBC: Recent Labs    09/23/22 1908 12/01/22 1158 03/23/23 0427 03/28/23 1139  WBC 7.8 7.6 8.1 7.0  HGB 13.2 13.0 13.2 11.9*  HCT 37.7* 38.8* 39.2 35.3*  PLT 212 297 224 227    COAGS: Recent Labs    03/23/23 0427  INR 1.1    BMP: Recent Labs    09/23/22 1908 12/01/22 1158 03/23/23 0427 03/28/23 1139  NA 131* 134* 137 134*  K 3.7 4.0 3.9 4.4  CL 100 102 104 104  CO2 23 22 22 25   GLUCOSE 125* 99 88 113*  BUN 16 17 16 15   CALCIUM 8.8* 8.7* 9.1 8.9  CREATININE 1.13 1.13 1.08 1.21  GFRNONAA >60 >60 >60 >60    LIVER FUNCTION TESTS: Recent Labs    03/23/23 0427  BILITOT 0.8  AST 20  ALT 17  ALKPHOS 66  PROT 6.3*  ALBUMIN 3.4*    TUMOR MARKERS: No results for input(s): "AFPTM", "CEA", "CA199", "CHROMGRNA" in the last 8760 hours.  Assessment and Plan:  Scheduled today for supra pubic catheter placement Spoke  to pts Brother Albert Garrison and son Albert Garrison via phone Pts family is aware of procedure benefits and risks Including but not limited to Infection, bleeding; damage to surrounding structures Agreeable to proceed Consent signed and in chart  Thank you for this interesting consult.  I greatly enjoyed meeting Albert Garrison and look forward to participating in their care.  A copy of this report was sent to the requesting provider on this date.  Electronically Signed: Robet Leu, PA-C 06/28/2023, 9:12 AM   I spent a total of  30 Minutes   in face to face in clinical consultation, greater than 50% of which was counseling/coordinating care for supra pubic catheter placement

## 2023-06-28 NOTE — Progress Notes (Signed)
 Per patients medication list, patient was given aspirin on 3/23 and 3/24. [place] [facility] was called and spoke to Camilla, [employer]. Per Tiffany she said that those administrations were documented incorrectly and that the Aspirin has been held since 3/21 making it 5 days held. Tiffany stated that her manager has to fix those documentation corrections.

## 2023-06-29 DIAGNOSIS — E785 Hyperlipidemia, unspecified: Secondary | ICD-10-CM | POA: Diagnosis not present

## 2023-06-29 DIAGNOSIS — N182 Chronic kidney disease, stage 2 (mild): Secondary | ICD-10-CM | POA: Diagnosis not present

## 2023-06-29 DIAGNOSIS — R339 Retention of urine, unspecified: Secondary | ICD-10-CM | POA: Diagnosis not present

## 2023-06-29 DIAGNOSIS — N1831 Chronic kidney disease, stage 3a: Secondary | ICD-10-CM | POA: Diagnosis not present

## 2023-06-29 DIAGNOSIS — E119 Type 2 diabetes mellitus without complications: Secondary | ICD-10-CM | POA: Diagnosis not present

## 2023-06-29 DIAGNOSIS — I2510 Atherosclerotic heart disease of native coronary artery without angina pectoris: Secondary | ICD-10-CM | POA: Diagnosis not present

## 2023-06-29 DIAGNOSIS — G629 Polyneuropathy, unspecified: Secondary | ICD-10-CM | POA: Diagnosis not present

## 2023-06-30 DIAGNOSIS — R7309 Other abnormal glucose: Secondary | ICD-10-CM | POA: Diagnosis not present

## 2023-07-01 DIAGNOSIS — Z556 Problems related to health literacy: Secondary | ICD-10-CM | POA: Diagnosis not present

## 2023-07-01 DIAGNOSIS — Z7984 Long term (current) use of oral hypoglycemic drugs: Secondary | ICD-10-CM | POA: Diagnosis not present

## 2023-07-01 DIAGNOSIS — I129 Hypertensive chronic kidney disease with stage 1 through stage 4 chronic kidney disease, or unspecified chronic kidney disease: Secondary | ICD-10-CM | POA: Diagnosis not present

## 2023-07-01 DIAGNOSIS — Z87891 Personal history of nicotine dependence: Secondary | ICD-10-CM | POA: Diagnosis not present

## 2023-07-01 DIAGNOSIS — Z466 Encounter for fitting and adjustment of urinary device: Secondary | ICD-10-CM | POA: Diagnosis not present

## 2023-07-01 DIAGNOSIS — Z87440 Personal history of urinary (tract) infections: Secondary | ICD-10-CM | POA: Diagnosis not present

## 2023-07-01 DIAGNOSIS — E1140 Type 2 diabetes mellitus with diabetic neuropathy, unspecified: Secondary | ICD-10-CM | POA: Diagnosis not present

## 2023-07-01 DIAGNOSIS — N138 Other obstructive and reflux uropathy: Secondary | ICD-10-CM | POA: Diagnosis not present

## 2023-07-01 DIAGNOSIS — Z794 Long term (current) use of insulin: Secondary | ICD-10-CM | POA: Diagnosis not present

## 2023-07-01 DIAGNOSIS — G4730 Sleep apnea, unspecified: Secondary | ICD-10-CM | POA: Diagnosis not present

## 2023-07-01 DIAGNOSIS — H9190 Unspecified hearing loss, unspecified ear: Secondary | ICD-10-CM | POA: Diagnosis not present

## 2023-07-01 DIAGNOSIS — M1990 Unspecified osteoarthritis, unspecified site: Secondary | ICD-10-CM | POA: Diagnosis not present

## 2023-07-01 DIAGNOSIS — Z8673 Personal history of transient ischemic attack (TIA), and cerebral infarction without residual deficits: Secondary | ICD-10-CM | POA: Diagnosis not present

## 2023-07-01 DIAGNOSIS — E1122 Type 2 diabetes mellitus with diabetic chronic kidney disease: Secondary | ICD-10-CM | POA: Diagnosis not present

## 2023-07-01 DIAGNOSIS — Z7982 Long term (current) use of aspirin: Secondary | ICD-10-CM | POA: Diagnosis not present

## 2023-07-01 DIAGNOSIS — R1310 Dysphagia, unspecified: Secondary | ICD-10-CM | POA: Diagnosis not present

## 2023-07-01 DIAGNOSIS — N182 Chronic kidney disease, stage 2 (mild): Secondary | ICD-10-CM | POA: Diagnosis not present

## 2023-07-01 DIAGNOSIS — R338 Other retention of urine: Secondary | ICD-10-CM | POA: Diagnosis not present

## 2023-07-01 DIAGNOSIS — I2510 Atherosclerotic heart disease of native coronary artery without angina pectoris: Secondary | ICD-10-CM | POA: Diagnosis not present

## 2023-09-28 ENCOUNTER — Other Ambulatory Visit: Payer: Self-pay | Admitting: Urology

## 2024-05-08 ENCOUNTER — Telehealth: Payer: Self-pay

## 2024-05-08 NOTE — Telephone Encounter (Signed)
 Patient no longer on this medication

## 2024-08-07 ENCOUNTER — Ambulatory Visit: Admitting: Urology
